# Patient Record
Sex: Female | Born: 1935
Health system: Southern US, Community
[De-identification: ages and names within clinical notes are randomized; demographics above are authoritative.]

## PROBLEM LIST (undated history)

## (undated) DIAGNOSIS — K219 Gastro-esophageal reflux disease without esophagitis: Secondary | ICD-10-CM

## (undated) DIAGNOSIS — R42 Dizziness and giddiness: Secondary | ICD-10-CM

## (undated) DIAGNOSIS — Z85038 Personal history of other malignant neoplasm of large intestine: Secondary | ICD-10-CM

## (undated) DIAGNOSIS — A0472 Enterocolitis due to Clostridium difficile, not specified as recurrent: Secondary | ICD-10-CM

## (undated) DIAGNOSIS — F32A Depression, unspecified: Secondary | ICD-10-CM

## (undated) DIAGNOSIS — E785 Hyperlipidemia, unspecified: Secondary | ICD-10-CM

## (undated) DIAGNOSIS — M4850XA Collapsed vertebra, not elsewhere classified, site unspecified, initial encounter for fracture: Secondary | ICD-10-CM

## (undated) DIAGNOSIS — Z87440 Personal history of urinary (tract) infections: Secondary | ICD-10-CM

## (undated) DIAGNOSIS — C801 Malignant (primary) neoplasm, unspecified: Secondary | ICD-10-CM

## (undated) DIAGNOSIS — F419 Anxiety disorder, unspecified: Secondary | ICD-10-CM

## (undated) DIAGNOSIS — M1990 Unspecified osteoarthritis, unspecified site: Secondary | ICD-10-CM

## (undated) DIAGNOSIS — I10 Essential (primary) hypertension: Secondary | ICD-10-CM

## (undated) DIAGNOSIS — F0390 Unspecified dementia without behavioral disturbance: Secondary | ICD-10-CM

## (undated) DIAGNOSIS — F329 Major depressive disorder, single episode, unspecified: Secondary | ICD-10-CM

## (undated) HISTORY — PX: CATARACT EXTRACTION: SUR2

## (undated) HISTORY — DX: Collapsed vertebra, not elsewhere classified, site unspecified, initial encounter for fracture: M48.50XA

## (undated) HISTORY — DX: Hyperlipidemia, unspecified: E78.5

## (undated) HISTORY — DX: Anxiety disorder, unspecified: F41.9

## (undated) HISTORY — DX: Dizziness and giddiness: R42

## (undated) HISTORY — DX: Personal history of other malignant neoplasm of large intestine: Z85.038

## (undated) HISTORY — DX: Major depressive disorder, single episode, unspecified: F32.9

## (undated) HISTORY — DX: Depression, unspecified: F32.A

## (undated) HISTORY — PX: APPENDECTOMY: SHX54

## (undated) HISTORY — DX: Unspecified osteoarthritis, unspecified site: M19.90

## (undated) HISTORY — PX: TONSILLECTOMY: SUR1361

## (undated) HISTORY — DX: Gastro-esophageal reflux disease without esophagitis: K21.9

## (undated) HISTORY — DX: Essential (primary) hypertension: I10

## (undated) HISTORY — PX: COLON RESECTION: SHX5231

---

## 1998-02-21 ENCOUNTER — Emergency Department (HOSPITAL_COMMUNITY): Admission: EM | Admit: 1998-02-21 | Discharge: 1998-02-21 | Payer: Self-pay | Admitting: Emergency Medicine

## 1998-08-15 ENCOUNTER — Inpatient Hospital Stay (HOSPITAL_COMMUNITY): Admission: EM | Admit: 1998-08-15 | Discharge: 1998-08-17 | Payer: Self-pay | Admitting: Emergency Medicine

## 2000-01-05 ENCOUNTER — Other Ambulatory Visit: Admission: RE | Admit: 2000-01-05 | Discharge: 2000-01-05 | Payer: Self-pay | Admitting: Family Medicine

## 2001-02-07 ENCOUNTER — Encounter: Admission: RE | Admit: 2001-02-07 | Discharge: 2001-02-07 | Payer: Self-pay | Admitting: Internal Medicine

## 2001-02-07 ENCOUNTER — Encounter: Payer: Self-pay | Admitting: Internal Medicine

## 2001-11-02 LAB — HM DEXA SCAN

## 2002-01-23 ENCOUNTER — Other Ambulatory Visit: Admission: RE | Admit: 2002-01-23 | Discharge: 2002-01-23 | Payer: Self-pay | Admitting: Internal Medicine

## 2002-10-02 LAB — HM COLONOSCOPY: HM Colonoscopy: NEGATIVE

## 2002-10-03 ENCOUNTER — Ambulatory Visit (HOSPITAL_COMMUNITY): Admission: RE | Admit: 2002-10-03 | Discharge: 2002-10-03 | Payer: Self-pay | Admitting: Gastroenterology

## 2004-01-15 ENCOUNTER — Encounter: Admission: RE | Admit: 2004-01-15 | Discharge: 2004-01-15 | Payer: Self-pay | Admitting: Family Medicine

## 2004-09-01 ENCOUNTER — Encounter: Admission: RE | Admit: 2004-09-01 | Discharge: 2004-09-01 | Payer: Self-pay | Admitting: Family Medicine

## 2004-11-24 ENCOUNTER — Ambulatory Visit: Payer: Self-pay | Admitting: Family Medicine

## 2005-01-26 ENCOUNTER — Ambulatory Visit: Payer: Self-pay | Admitting: Family Medicine

## 2005-08-27 ENCOUNTER — Ambulatory Visit: Payer: Self-pay | Admitting: Family Medicine

## 2005-09-29 ENCOUNTER — Ambulatory Visit: Payer: Self-pay | Admitting: Family Medicine

## 2005-10-06 ENCOUNTER — Ambulatory Visit: Payer: Self-pay | Admitting: Family Medicine

## 2005-10-20 ENCOUNTER — Ambulatory Visit: Payer: Self-pay | Admitting: Family Medicine

## 2005-11-23 ENCOUNTER — Ambulatory Visit: Payer: Self-pay | Admitting: Family Medicine

## 2006-01-25 ENCOUNTER — Ambulatory Visit: Payer: Self-pay | Admitting: Family Medicine

## 2006-02-03 ENCOUNTER — Ambulatory Visit: Payer: Self-pay | Admitting: Family Medicine

## 2006-02-08 ENCOUNTER — Ambulatory Visit: Payer: Self-pay | Admitting: Family Medicine

## 2006-03-16 ENCOUNTER — Ambulatory Visit: Payer: Self-pay | Admitting: Family Medicine

## 2006-03-23 ENCOUNTER — Ambulatory Visit: Payer: Self-pay | Admitting: Family Medicine

## 2006-05-06 ENCOUNTER — Ambulatory Visit: Payer: Self-pay | Admitting: Family Medicine

## 2006-08-03 ENCOUNTER — Ambulatory Visit: Payer: Self-pay | Admitting: Family Medicine

## 2006-12-01 ENCOUNTER — Emergency Department (HOSPITAL_COMMUNITY): Admission: EM | Admit: 2006-12-01 | Discharge: 2006-12-02 | Payer: Self-pay | Admitting: Emergency Medicine

## 2006-12-01 ENCOUNTER — Ambulatory Visit: Payer: Self-pay | Admitting: Family Medicine

## 2006-12-01 LAB — CONVERTED CEMR LAB
ALT: 15 units/L (ref 0–40)
AST: 19 units/L (ref 0–37)
Cholesterol: 195 mg/dL (ref 0–200)
Direct LDL: 109.9 mg/dL
HDL: 30.7 mg/dL — ABNORMAL LOW (ref >39.0)
Total CHOL/HDL Ratio: 6.4
Triglycerides: 265 mg/dL (ref 0–149)
VLDL: 53 mg/dL — ABNORMAL HIGH (ref 0–40)

## 2007-02-04 ENCOUNTER — Ambulatory Visit: Payer: Self-pay | Admitting: Family Medicine

## 2007-02-21 ENCOUNTER — Ambulatory Visit: Payer: Self-pay | Admitting: Family Medicine

## 2007-10-12 ENCOUNTER — Encounter (INDEPENDENT_AMBULATORY_CARE_PROVIDER_SITE_OTHER): Payer: Self-pay | Admitting: Internal Medicine

## 2007-10-12 ENCOUNTER — Ambulatory Visit: Payer: Self-pay | Admitting: Family Medicine

## 2007-10-17 ENCOUNTER — Telehealth (INDEPENDENT_AMBULATORY_CARE_PROVIDER_SITE_OTHER): Payer: Self-pay | Admitting: Internal Medicine

## 2008-05-01 ENCOUNTER — Telehealth (INDEPENDENT_AMBULATORY_CARE_PROVIDER_SITE_OTHER): Payer: Self-pay | Admitting: *Deleted

## 2008-05-02 ENCOUNTER — Encounter: Payer: Self-pay | Admitting: Family Medicine

## 2008-05-03 ENCOUNTER — Telehealth: Payer: Self-pay | Admitting: Family Medicine

## 2008-05-09 ENCOUNTER — Ambulatory Visit: Payer: Self-pay | Admitting: Family Medicine

## 2008-05-09 DIAGNOSIS — I10 Essential (primary) hypertension: Secondary | ICD-10-CM | POA: Insufficient documentation

## 2008-05-09 DIAGNOSIS — J309 Allergic rhinitis, unspecified: Secondary | ICD-10-CM | POA: Insufficient documentation

## 2008-05-09 DIAGNOSIS — Z85038 Personal history of other malignant neoplasm of large intestine: Secondary | ICD-10-CM | POA: Insufficient documentation

## 2008-05-09 DIAGNOSIS — F329 Major depressive disorder, single episode, unspecified: Secondary | ICD-10-CM | POA: Insufficient documentation

## 2008-05-09 DIAGNOSIS — K648 Other hemorrhoids: Secondary | ICD-10-CM | POA: Insufficient documentation

## 2008-05-09 DIAGNOSIS — K219 Gastro-esophageal reflux disease without esophagitis: Secondary | ICD-10-CM | POA: Insufficient documentation

## 2008-05-09 DIAGNOSIS — E785 Hyperlipidemia, unspecified: Secondary | ICD-10-CM | POA: Insufficient documentation

## 2008-05-09 DIAGNOSIS — M1990 Unspecified osteoarthritis, unspecified site: Secondary | ICD-10-CM | POA: Insufficient documentation

## 2008-05-09 DIAGNOSIS — J45909 Unspecified asthma, uncomplicated: Secondary | ICD-10-CM | POA: Insufficient documentation

## 2008-05-09 DIAGNOSIS — K589 Irritable bowel syndrome without diarrhea: Secondary | ICD-10-CM | POA: Insufficient documentation

## 2008-05-17 ENCOUNTER — Telehealth (INDEPENDENT_AMBULATORY_CARE_PROVIDER_SITE_OTHER): Payer: Self-pay | Admitting: *Deleted

## 2008-08-06 ENCOUNTER — Telehealth: Payer: Self-pay | Admitting: Family Medicine

## 2008-08-07 ENCOUNTER — Telehealth (INDEPENDENT_AMBULATORY_CARE_PROVIDER_SITE_OTHER): Payer: Self-pay | Admitting: *Deleted

## 2008-09-10 ENCOUNTER — Telehealth: Payer: Self-pay | Admitting: Family Medicine

## 2008-09-12 ENCOUNTER — Encounter: Payer: Self-pay | Admitting: Family Medicine

## 2008-12-12 ENCOUNTER — Telehealth (INDEPENDENT_AMBULATORY_CARE_PROVIDER_SITE_OTHER): Payer: Self-pay | Admitting: Internal Medicine

## 2008-12-13 ENCOUNTER — Ambulatory Visit: Payer: Self-pay | Admitting: Family Medicine

## 2008-12-19 LAB — CONVERTED CEMR LAB: TSH: 1.24 microintl units/mL (ref 0.35–5.50)

## 2008-12-28 ENCOUNTER — Encounter: Payer: Self-pay | Admitting: Family Medicine

## 2008-12-28 ENCOUNTER — Emergency Department (HOSPITAL_COMMUNITY): Admission: EM | Admit: 2008-12-28 | Discharge: 2008-12-28 | Payer: Self-pay | Admitting: Emergency Medicine

## 2008-12-31 ENCOUNTER — Ambulatory Visit: Payer: Self-pay | Admitting: Family Medicine

## 2009-01-02 LAB — CONVERTED CEMR LAB
ALT: 16 units/L (ref 0–35)
AST: 24 units/L (ref 0–37)
Albumin: 4 g/dL (ref 3.5–5.2)
Alkaline Phosphatase: 68 units/L (ref 39–117)
BUN: 20 mg/dL (ref 6–23)
Basophils Absolute: 0 10*3/uL (ref 0.0–0.1)
Basophils Relative: 0.1 % (ref 0.0–3.0)
Bilirubin, Direct: 0.1 mg/dL (ref 0.0–0.3)
CO2: 31 meq/L (ref 19–32)
Calcium: 9.6 mg/dL (ref 8.4–10.5)
Chloride: 100 meq/L (ref 96–112)
Creatinine, Ser: 0.9 mg/dL (ref 0.4–1.2)
Eosinophils Absolute: 0.7 10*3/uL (ref 0.0–0.7)
Eosinophils Relative: 9.8 % — ABNORMAL HIGH (ref 0.0–5.0)
GFR calc Af Amer: 77 mL/min
GFR calc non Af Amer: 64 mL/min
Glucose, Bld: 94 mg/dL (ref 70–99)
HCT: 43.5 % (ref 36.0–46.0)
Hemoglobin: 14.9 g/dL (ref 12.0–15.0)
Lymphocytes Relative: 22.4 % (ref 12.0–46.0)
MCHC: 34.2 g/dL (ref 30.0–36.0)
MCV: 100.7 fL — ABNORMAL HIGH (ref 78.0–100.0)
Monocytes Absolute: 0.3 10*3/uL (ref 0.1–1.0)
Monocytes Relative: 3.9 % (ref 3.0–12.0)
Neutro Abs: 4.4 10*3/uL (ref 1.4–7.7)
Neutrophils Relative %: 63.8 % (ref 43.0–77.0)
Phosphorus: 4.2 mg/dL (ref 2.3–4.6)
Platelets: 194 10*3/uL (ref 150–400)
Potassium: 4.2 meq/L (ref 3.5–5.1)
RBC: 4.31 M/uL (ref 3.87–5.11)
RDW: 12 % (ref 11.5–14.6)
Sodium: 140 meq/L (ref 135–145)
Total Bilirubin: 1 mg/dL (ref 0.3–1.2)
Total Protein: 6.8 g/dL (ref 6.0–8.3)
WBC: 6.9 10*3/uL (ref 4.5–10.5)

## 2009-01-07 ENCOUNTER — Telehealth: Payer: Self-pay | Admitting: Family Medicine

## 2009-01-17 ENCOUNTER — Telehealth: Payer: Self-pay | Admitting: Family Medicine

## 2009-02-04 ENCOUNTER — Telehealth: Payer: Self-pay | Admitting: Family Medicine

## 2009-02-05 ENCOUNTER — Ambulatory Visit: Payer: Self-pay | Admitting: Family Medicine

## 2009-02-06 LAB — CONVERTED CEMR LAB
ALT: 15 units/L (ref 0–35)
AST: 19 units/L (ref 0–37)
Albumin: 4.1 g/dL (ref 3.5–5.2)
Alkaline Phosphatase: 64 units/L (ref 39–117)
Basophils Absolute: 0.1 10*3/uL (ref 0.0–0.1)
Basophils Relative: 0.9 % (ref 0.0–3.0)
Bilirubin, Direct: 0.1 mg/dL (ref 0.0–0.3)
Cholesterol: 158 mg/dL (ref 0–200)
Creatinine, Ser: 0.7 mg/dL (ref 0.4–1.2)
Direct LDL: 92 mg/dL
Eosinophils Absolute: 0.6 10*3/uL (ref 0.0–0.7)
Eosinophils Relative: 8.4 % — ABNORMAL HIGH (ref 0.0–5.0)
Glucose, Bld: 106 mg/dL — ABNORMAL HIGH (ref 70–99)
HCT: 42.3 % (ref 36.0–46.0)
HDL: 27.7 mg/dL — ABNORMAL LOW (ref >39.00)
Hemoglobin: 14.4 g/dL (ref 12.0–15.0)
Lymphocytes Relative: 19.3 % (ref 12.0–46.0)
Lymphs Abs: 1.4 10*3/uL (ref 0.7–4.0)
MCHC: 34.1 g/dL (ref 30.0–36.0)
MCV: 98.8 fL (ref 78.0–100.0)
Monocytes Absolute: 0.7 10*3/uL (ref 0.1–1.0)
Monocytes Relative: 9.6 % (ref 3.0–12.0)
Neutro Abs: 4.2 10*3/uL (ref 1.4–7.7)
Neutrophils Relative %: 61.8 % (ref 43.0–77.0)
Phosphorus: 3 mg/dL (ref 2.3–4.6)
Platelets: 198 10*3/uL (ref 150.0–400.0)
Potassium: 4.5 meq/L (ref 3.5–5.1)
RBC: 4.28 M/uL (ref 3.87–5.11)
RDW: 12.1 % (ref 11.5–14.6)
Sodium: 141 meq/L (ref 135–145)
TSH: 0.92 microintl units/mL (ref 0.35–5.50)
Total Bilirubin: 0.8 mg/dL (ref 0.3–1.2)
Total CHOL/HDL Ratio: 6
Total Protein: 6.6 g/dL (ref 6.0–8.3)
Triglycerides: 252 mg/dL — ABNORMAL HIGH (ref 0.0–149.0)
VLDL: 50.4 mg/dL — ABNORMAL HIGH (ref 0.0–40.0)
WBC: 7 10*3/uL (ref 4.5–10.5)

## 2009-05-01 ENCOUNTER — Encounter: Payer: Self-pay | Admitting: Family Medicine

## 2009-05-07 ENCOUNTER — Ambulatory Visit: Payer: Self-pay | Admitting: Family Medicine

## 2009-07-10 ENCOUNTER — Ambulatory Visit: Payer: Self-pay | Admitting: Family Medicine

## 2009-07-11 LAB — CONVERTED CEMR LAB
ALT: 17 units/L (ref 0–35)
AST: 22 units/L (ref 0–37)
Albumin: 4 g/dL (ref 3.5–5.2)
BUN: 20 mg/dL (ref 6–23)
CO2: 31 meq/L (ref 19–32)
Calcium: 9.5 mg/dL (ref 8.4–10.5)
Chloride: 104 meq/L (ref 96–112)
Cholesterol: 180 mg/dL (ref 0–200)
Creatinine, Ser: 0.8 mg/dL (ref 0.4–1.2)
Direct LDL: 112.5 mg/dL
Glucose, Bld: 105 mg/dL — ABNORMAL HIGH (ref 70–99)
HDL: 30.9 mg/dL — ABNORMAL LOW (ref >39.00)
Phosphorus: 3.8 mg/dL (ref 2.3–4.6)
Potassium: 4.8 meq/L (ref 3.5–5.1)
Sodium: 141 meq/L (ref 135–145)
Total CHOL/HDL Ratio: 6
Triglycerides: 225 mg/dL — ABNORMAL HIGH (ref 0.0–149.0)
VLDL: 45 mg/dL — ABNORMAL HIGH (ref 0.0–40.0)

## 2009-07-15 ENCOUNTER — Ambulatory Visit: Payer: Self-pay | Admitting: Family Medicine

## 2009-11-07 ENCOUNTER — Encounter: Payer: Self-pay | Admitting: Family Medicine

## 2009-11-20 ENCOUNTER — Encounter: Admission: RE | Admit: 2009-11-20 | Discharge: 2009-11-20 | Payer: Self-pay | Admitting: Orthopedic Surgery

## 2009-12-10 ENCOUNTER — Encounter: Payer: Self-pay | Admitting: Family Medicine

## 2009-12-10 ENCOUNTER — Telehealth: Payer: Self-pay | Admitting: Family Medicine

## 2009-12-17 ENCOUNTER — Telehealth: Payer: Self-pay | Admitting: Family Medicine

## 2009-12-17 ENCOUNTER — Encounter: Payer: Self-pay | Admitting: Family Medicine

## 2010-01-02 ENCOUNTER — Encounter: Payer: Self-pay | Admitting: Family Medicine

## 2010-01-03 ENCOUNTER — Telehealth: Payer: Self-pay | Admitting: Family Medicine

## 2010-01-06 ENCOUNTER — Ambulatory Visit: Payer: Self-pay | Admitting: Family Medicine

## 2010-01-07 LAB — CONVERTED CEMR LAB
ALT: 15 units/L (ref 0–35)
AST: 21 units/L (ref 0–37)
Albumin: 3.9 g/dL (ref 3.5–5.2)
Alkaline Phosphatase: 65 units/L (ref 39–117)
BUN: 21 mg/dL (ref 6–23)
Bilirubin, Direct: 0.1 mg/dL (ref 0.0–0.3)
CO2: 31 meq/L (ref 19–32)
Calcium: 9.6 mg/dL (ref 8.4–10.5)
Chloride: 101 meq/L (ref 96–112)
Cholesterol: 160 mg/dL (ref 0–200)
Creatinine, Ser: 0.8 mg/dL (ref 0.4–1.2)
Direct LDL: 98.6 mg/dL
GFR calc non Af Amer: 72.48 mL/min (ref >60)
Glucose, Bld: 113 mg/dL — ABNORMAL HIGH (ref 70–99)
HDL: 34.2 mg/dL — ABNORMAL LOW (ref >39.00)
Phosphorus: 4 mg/dL (ref 2.3–4.6)
Potassium: 4.4 meq/L (ref 3.5–5.1)
Sodium: 140 meq/L (ref 135–145)
Total Bilirubin: 0.8 mg/dL (ref 0.3–1.2)
Total CHOL/HDL Ratio: 5
Total Protein: 6.7 g/dL (ref 6.0–8.3)
Triglycerides: 252 mg/dL — ABNORMAL HIGH (ref 0.0–149.0)
VLDL: 50.4 mg/dL — ABNORMAL HIGH (ref 0.0–40.0)

## 2010-01-08 ENCOUNTER — Ambulatory Visit: Payer: Self-pay | Admitting: Family Medicine

## 2010-01-08 DIAGNOSIS — R7303 Prediabetes: Secondary | ICD-10-CM | POA: Insufficient documentation

## 2010-02-28 ENCOUNTER — Emergency Department (HOSPITAL_COMMUNITY)
Admission: EM | Admit: 2010-02-28 | Discharge: 2010-02-28 | Payer: Self-pay | Source: Home / Self Care | Admitting: Emergency Medicine

## 2010-07-08 ENCOUNTER — Ambulatory Visit: Payer: Self-pay | Admitting: Family Medicine

## 2010-07-08 LAB — CONVERTED CEMR LAB
ALT: 16 units/L (ref 0–35)
AST: 21 units/L (ref 0–37)
Albumin: 4.3 g/dL (ref 3.5–5.2)
BUN: 20 mg/dL (ref 6–23)
CO2: 32 meq/L (ref 19–32)
Calcium: 9.9 mg/dL (ref 8.4–10.5)
Chloride: 103 meq/L (ref 96–112)
Cholesterol: 182 mg/dL (ref 0–200)
Creatinine, Ser: 0.7 mg/dL (ref 0.4–1.2)
Direct LDL: 113.7 mg/dL
GFR calc non Af Amer: 80.46 mL/min (ref >60)
Glucose, Bld: 100 mg/dL — ABNORMAL HIGH (ref 70–99)
HDL: 29.6 mg/dL — ABNORMAL LOW (ref >39.00)
Hgb A1c MFr Bld: 5.9 % (ref 4.6–6.5)
Phosphorus: 3.6 mg/dL (ref 2.3–4.6)
Potassium: 4.8 meq/L (ref 3.5–5.1)
Sodium: 142 meq/L (ref 135–145)
Total CHOL/HDL Ratio: 6
Triglycerides: 246 mg/dL — ABNORMAL HIGH (ref 0.0–149.0)
VLDL: 49.2 mg/dL — ABNORMAL HIGH (ref 0.0–40.0)

## 2010-07-23 ENCOUNTER — Ambulatory Visit: Payer: Self-pay | Admitting: Family Medicine

## 2010-09-04 ENCOUNTER — Ambulatory Visit: Payer: Self-pay | Admitting: Family Medicine

## 2010-09-04 DIAGNOSIS — I8390 Asymptomatic varicose veins of unspecified lower extremity: Secondary | ICD-10-CM | POA: Insufficient documentation

## 2010-09-08 ENCOUNTER — Encounter (INDEPENDENT_AMBULATORY_CARE_PROVIDER_SITE_OTHER): Payer: Self-pay | Admitting: *Deleted

## 2010-09-10 ENCOUNTER — Encounter: Payer: Self-pay | Admitting: Family Medicine

## 2010-10-17 ENCOUNTER — Ambulatory Visit: Payer: Self-pay | Admitting: Family Medicine

## 2010-10-17 ENCOUNTER — Encounter: Payer: Self-pay | Admitting: Family Medicine

## 2010-10-17 DIAGNOSIS — N390 Urinary tract infection, site not specified: Secondary | ICD-10-CM | POA: Insufficient documentation

## 2010-10-17 LAB — CONVERTED CEMR LAB
Cholesterol, target level: 200 mg/dL
Glucose, Urine, Semiquant: NEGATIVE
HDL goal, serum: 40 mg/dL
LDL Goal: 130 mg/dL
Nitrite: NEGATIVE
Protein, U semiquant: NEGATIVE
Specific Gravity, Urine: 1.025
Urobilinogen, UA: 0.2
pH: 5

## 2010-10-20 ENCOUNTER — Telehealth: Payer: Self-pay | Admitting: Family Medicine

## 2010-10-20 ENCOUNTER — Emergency Department: Payer: Self-pay | Admitting: Emergency Medicine

## 2010-10-21 ENCOUNTER — Emergency Department: Payer: Self-pay | Admitting: Emergency Medicine

## 2010-10-22 ENCOUNTER — Observation Stay (HOSPITAL_COMMUNITY)
Admission: EM | Admit: 2010-10-22 | Discharge: 2010-10-24 | Payer: Self-pay | Source: Home / Self Care | Attending: Internal Medicine | Admitting: Internal Medicine

## 2010-10-24 ENCOUNTER — Encounter (INDEPENDENT_AMBULATORY_CARE_PROVIDER_SITE_OTHER): Payer: Self-pay | Admitting: Internal Medicine

## 2010-10-24 ENCOUNTER — Encounter: Payer: Self-pay | Admitting: Family Medicine

## 2010-10-25 ENCOUNTER — Observation Stay (HOSPITAL_COMMUNITY)
Admission: EM | Admit: 2010-10-25 | Discharge: 2010-10-28 | Payer: Self-pay | Source: Home / Self Care | Attending: Internal Medicine | Admitting: Internal Medicine

## 2010-10-28 ENCOUNTER — Encounter: Payer: Self-pay | Admitting: Family Medicine

## 2010-11-07 ENCOUNTER — Ambulatory Visit (HOSPITAL_COMMUNITY)
Admission: RE | Admit: 2010-11-07 | Discharge: 2010-11-07 | Payer: Self-pay | Source: Home / Self Care | Attending: Internal Medicine | Admitting: Internal Medicine

## 2010-11-10 ENCOUNTER — Telehealth: Payer: Self-pay | Admitting: Family Medicine

## 2010-11-11 ENCOUNTER — Ambulatory Visit (HOSPITAL_COMMUNITY)
Admission: RE | Admit: 2010-11-11 | Discharge: 2010-11-11 | Payer: Self-pay | Source: Home / Self Care | Attending: Internal Medicine | Admitting: Internal Medicine

## 2010-11-17 ENCOUNTER — Telehealth: Payer: Self-pay | Admitting: Family Medicine

## 2010-11-28 ENCOUNTER — Ambulatory Visit: Admit: 2010-11-28 | Payer: Self-pay | Admitting: Family Medicine

## 2010-12-02 ENCOUNTER — Encounter: Payer: Self-pay | Admitting: Family Medicine

## 2010-12-04 ENCOUNTER — Telehealth: Payer: Self-pay | Admitting: Family Medicine

## 2010-12-04 NOTE — Assessment & Plan Note (Signed)
Summary: ? UTI/ lb   Vital Signs:  Patient profile:   77 year old female Height:      63 inches Weight:      166 pounds BMI:     29.51 Temp:     97.8 degrees F oral Pulse rate:   88 / minute Pulse rhythm:   regular BP sitting:   150 / 90  (left arm) Cuff size:   large  Vitals Entered By: Melody Comas (October 17, 2010 1:50 PM) CC: uti, Lipid Management   History of Present Illness: Frequency and change in urination.  Occ lower back pain.  Some burning with urination.  No NAV, F, D, chills.  No lower abdominal pain.    L lower back pain after stretching to get something out of the cabinet a few days ago.  discomfort improved when sitting.   Allergies: 1)  ! Augmentin  Review of Systems       See HPI.  Otherwise negative.    Physical Exam  General:  no apparent distress mucous membranes moist regular rate and rhythm clear to auscultation bilaterally abdomen soft, not tender to palpation, no cva pain.  L lower lumbar paraspinal muscles minimally tender to palpation    Impression & Recommendations:  Problem # 1:  UTI (ICD-599.0) Assessment New Likely cystitis with unrelated muscle strain.  I don't suspect pyelo.  okay for outpatient follow up.  Start antibiotics and contact with culture data.  follow up as needed.  She agrees.  Her updated medication list for this problem includes:    Septra Ds 800-160 Mg Tabs (Sulfamethoxazole-trimethoprim) .Marland Kitchen... 1 by mouth two times a day  Orders: Prescription Created Electronically [id number]) T-Culture, Urine ([id number])  Complete Medication List: 1)  Paroxetine Hcl 30 Mg Tabs (Paroxetine hcl) .Marland Kitchen.. 1 by mouth once daily 2)  Toprol Xl 50 Mg Tb24 (Metoprolol succinate) .Marland Kitchen.. 1 by mouth once daily 3)  Tylenol Extra Strength 500 Mg Tabs (Acetaminophen) .... As needed 4)  Allopurinol 100 Mg Tabs (Allopurinol) .... 2 by mouth once daily 5)  Calcium 500/d 500-400 Mg-unit Chew (Calcium-vitamin d) .... Take 1 tablet by mouth two times  a day 6)  Glucosamine-chondroitin 1500-1200 Mg/53ml Liqd (Glucosamine-chondroitin) .Marland Kitchen.. 1 daily by mouth 7)  Bayer Aspirin Ec Low Dose 81 Mg Tbec (Aspirin) .Marland Kitchen.. 1 daily by mouth 8)  Meclizine Hcl 25 Mg Tabs (Meclizine hcl) .... One tab by mouth three times a day as needed for dizziness/vertigo 9)  Cozaar 100 Mg Tabs (Losartan potassium) .Marland Kitchen.. 1 by mouth once daily 10)  Alprazolam 0.5 Mg Tabs (Alprazolam) .... 1/2 to 1 by mouth up to two times a day as needed severe anxiety 11)  Flonase 50 Mcg/act Susp (Fluticasone propionate) .... 2 sprays in each nostril once daily as needed 12)  Anusol-hc 25 Mg Supp (Hydrocortisone acetate) .Marland Kitchen.. 1 per rectum at bedtime for maximum 10 days for hemorroids as needed 13)  Multivitamins Tabs (Multiple vitamin) .... Take one daily 14)  Support Stockings To Knee-- 15-20 Mm Hg  .... To wear once daily and as needed for leg edema and painful varicose veins 15)  Voltaren 1 % Gel (Diclofenac sodium) .... Apply to affected areas up to four times daily  as needed arthritis pain 16)  Septra Ds 800-160 Mg Tabs (Sulfamethoxazole-trimethoprim) .Marland Kitchen.. 1 by mouth two times a day    Patient Instructions: 1)  Start the antibiotics today and we'll let you know about the culture.  Let us know if you aren't improving.  Take care.  Prescriptions: SEPTRA DS 800-160 MG TABS (SULFAMETHOXAZOLE-TRIMETHOPRIM) 1 by mouth two times a day  #6 x 0   Entered and Authorized by:   Crawford Givens MD   Signed by:   Crawford Givens MD on 10/17/2010   Method used:   Electronically to        [facility]* (retail)       [address]       [place], [place]  [address]       Ph: [phone]       Fax: [phone]   RxID:   [id number]    Orders Added: 1)  Prescription Created Electronically [[id number]] 2)  Est. Patient Level III [40102] 3)  T-Culture, Urine [[id number]]    Current Allergies (reviewed today): ! AUGMENTIN  Laboratory Results   Urine Tests  Date/Time Received: October 17, 2010 2:01 PM Date/Time Reported: October 17, 2010 2:01 PM  Routine Urinalysis   Color: yellow Appearance: Cloudy Glucose: negative   (Normal Range: Negative) Bilirubin: small   (Normal Range: Negative) Ketone: trace (5)   (Normal Range: Negative) Spec. Gravity: 1.025   (Normal Range: 1.003-1.035) Blood: trace-intact   (Normal Range: Negative) pH: 5.0   (Normal Range: 5.0-8.0) Protein: negative   (Normal Range: Negative) Urobilinogen: 0.2   (Normal Range: 0-1) Nitrite: negative   (Normal Range: Negative) Leukocyte Esterace: trace   (Normal Range: Negative)

## 2010-12-04 NOTE — Progress Notes (Signed)
Summary: not any better  Phone Note Call from Patient Call back at Home Phone [phone]   Caller: Patient Call For: Dr. Para March Summary of Call: Pt states that she is not any better since her office visit. Back is still hurting.  I advised her that it can take a while for a pulled muscle to heal, she asks if she should be up moving around or should she stay off her feet.  Also, pull review urine culture results and advise. Initial call taken by: Lowella Petties CMA, AAMA,  October 20, 2010 11:35 AM  Follow-up for Phone Call        I would be as active as she can tolerate and gently stretch her back.  Ucx w/o sig growth.  If she continues to have dysuria (for a few more days), then we should reculture her urine.  follow up as needed.  thanks.  see ucx report.  Follow-up by: Crawford Givens MD,  October 20, 2010 1:39 PM  Additional Follow-up for Phone Call Additional follow up Details #1::        Spoke with pt and advised her of instructions. Additional Follow-up by: Lowella Petties CMA, AAMA,  October 20, 2010 4:07 PM

## 2010-12-04 NOTE — Letter (Signed)
Summary: [facility]   Imported By: Beau Fanny 11/04/2010 14:54:40  _____________________________________________________________________  External Attachment:    Type:   Image     Comment:   External Document

## 2010-12-04 NOTE — Assessment & Plan Note (Signed)
Summary: 2 MONTH FOLLOW UP/[facility]   Vital Signs:  Patient profile:   77 year old female Height:      63 inches Weight:      163 pounds Temp:     98 degrees F oral Pulse rate:   108 / minute Pulse rhythm:   regular BP sitting:   140 / 88  (left arm) Cuff size:   regular  Vitals Entered By: Liane Comber CMA (AAMA) (July 15, 2009 9:04 AM)  Serial Vital Signs/Assessments:  Time      Position  BP       Pulse  Resp  Temp     By                     782/95                         [name] MD   History of Present Illness: here for f/u of HTN and lipids  bp is improved from last visit did inc cozaar to 100 mg daily  no trouble with that at all   last lipids trig down a bit trid 225/ HDL 30;9- low , and LDL 112 (up a bit from the 90s)  sugar is stable at 101  for oa - tylenol 2 in am and 2 in pm     Allergies: 1)  ! Augmentin  Review of Systems General:  Denies fatigue, fever, loss of appetite, and malaise. Eyes:  Denies blurring and eye pain; some lid droop over eyes - may need to have that worked on in future if [employer] her vision. CV:  Denies chest pain or discomfort, palpitations, and shortness of breath with exertion. Resp:  Denies cough and wheezing. GI:  Denies abdominal pain and change in bowel habits. GU:  Denies urinary frequency. MS:  Complains of joint pain and stiffness; denies joint redness and joint swelling; did sprain her R ankle- was swollen - now getting better. Derm:  Denies itching, lesion(s), poor wound healing, and rash. Neuro:  Denies numbness and tremors. Psych:  mood is fair. Endo:  Denies cold intolerance, excessive thirst, excessive urination, and heat intolerance. Heme:  Denies abnormal bruising and bleeding.  Physical Exam  General:  overweight but generally well appearing  Head:  normocephalic, atraumatic, and no abnormalities observed.   Eyes:  vision grossly intact, pupils equal, pupils round, and pupils reactive to light.  no  conjunctival pallor, injection or icterus  Mouth:  pharynx pink and moist.   Neck:  supple with full rom and no masses or thyromegally, no JVD or carotid bruit  Lungs:  Normal respiratory effort, chest expands symmetrically. Lungs are clear to auscultation, no crackles or wheezes. Heart:  Normal rate and regular rhythm. S1 and S2 normal without gallop, murmur, click, rub or other extra sounds. Msk:  poor rom knees- with slow intentional gait  no kyphosis no spinal tenderness nl rom ankles  Extremities:  No clubbing, cyanosis, edema, or deformity noted with normal full range of motion of all joints.   Neurologic:  sensation intact to light touch, gait normal, and DTRs symmetrical and normal.   Skin:  Intact without suspicious lesions or rashes Cervical Nodes:  No lymphadenopathy noted Inguinal Nodes:  No significant adenopathy Psych:  normal affect, talkative and pleasant    Impression & Recommendations:  Problem # 1:  HYPERTENSION (ICD-401.9) Assessment Improved  improved with inc cozaar - ant tolerating it well  rev labs today disc healthy diet (low simple sugar/ choose complex carbs/ low sat fat) diet and exercise in detail  plan labs and then f/u in 6 mo  Her updated medication list for this problem includes:    Toprol Xl 50 Mg Tb24 (Metoprolol succinate) .Marland Kitchen... 1 by mouth once daily    Cozaar 100 Mg Tabs (Losartan potassium) .Marland Kitchen... 1 by mouth once daily  BP today: 140/88-- re check 128/83 Prior BP: 150/90 (05/07/2009)  Labs Reviewed: K+: 4.8 (07/10/2009) Creat: : 0.8 (07/10/2009)   Chol: 180 (07/10/2009)   HDL: 30.90 (07/10/2009)   LDL: DEL (12/01/2006)   TG: 225.0 (07/10/2009)  Problem # 2:  HYPERLIPIDEMIA (ICD-272.4) Assessment: Unchanged  with slt imp trig- still hig rev low sat fat diet and exercise  will continue to work on it  re check 6 mo and f/u  Labs Reviewed: SGOT: 22 (07/10/2009)   SGPT: 17 (07/10/2009)   HDL:30.90 (07/10/2009), 27.70 (02/05/2009)  LDL:DEL  (12/01/2006)  Chol:180 (07/10/2009), 158 (02/05/2009)  Trig:225.0 (07/10/2009), 252.0 (02/05/2009)  Complete Medication List: 1)  Paroxetine Hcl 30 Mg Tabs (Paroxetine hcl) .Marland Kitchen.. 1 by mouth once daily 2)  Toprol Xl 50 Mg Tb24 (Metoprolol succinate) .Marland Kitchen.. 1 by mouth once daily 3)  Tylenol Extra Strength 500 Mg Tabs (Acetaminophen) .... As needed 4)  Allopurinol 100 Mg Tabs (Allopurinol) .... 2 by mouth once daily 5)  Calcium 500/d 500-400 Mg-unit Chew (Calcium-vitamin d) .... Take 1 tablet by mouth two times a day 6)  Glucosamine-chondroitin 1500-1200 Mg/14ml Liqd (Glucosamine-chondroitin) .Marland Kitchen.. 1 daily by mouth 7)  Bayer Aspirin Ec Low Dose 81 Mg Tbec (Aspirin) .Marland Kitchen.. 1 daily by mouth 8)  Meclizine Hcl 25 Mg Tabs (Meclizine hcl) .... One tab by mouth three times a day as needed for dizziness/vertigo 9)  Cozaar 100 Mg Tabs (Losartan potassium) .Marland Kitchen.. 1 by mouth once daily 10)  Alprazolam 0.5 Mg Tabs (Alprazolam) .... 1/2 to 1 by mouth up to two times a day as needed severe anxiety 11)  Flonase 50 Mcg/act Susp (Fluticasone propionate) .... 2 sprays in each nostril once daily 12)  Anusol-hc 25 Mg Supp (Hydrocortisone acetate) .Marland Kitchen.. 1 per rectum at bedtime for maximum 10 days for hemorroids  Patient Instructions: 1)  no changes in medication  2)  keep watching diet - 3)  you can raise your HDL (good cholesterol) by increasing exercise and eating omega 3 fatty acid supplement like fish oil or flax seed oil over the counter 4)  you can lower LDL (bad cholesterol) by limiting saturated fats in diet like red meat, fried foods, egg yolks, fatty breakfast meats, high fat dairy products and shellfish  5)  get as much exercise as you can  6)  schedule fasting labs in 6 months lipid/ast/alt /renal 272  Prior Medications (reviewed today): PAROXETINE HCL 30 MG  TABS (PAROXETINE HCL) 1 by mouth once daily TOPROL XL 50 MG  TB24 (METOPROLOL SUCCINATE) 1 by mouth once daily TYLENOL EXTRA STRENGTH 500 MG  TABS  (ACETAMINOPHEN) as needed ALLOPURINOL 100 MG  TABS (ALLOPURINOL) 2 by mouth once daily CALCIUM 500/D 500-400 MG-UNIT  CHEW (CALCIUM-VITAMIN D) Take 1 tablet by mouth two times a day GLUCOSAMINE-CHONDROITIN 1500-1200 MG/30ML  LIQD (GLUCOSAMINE-CHONDROITIN) 1 DAILY BY MOUTH BAYER ASPIRIN EC LOW DOSE 81 MG  TBEC (ASPIRIN) 1 DAILY BY MOUTH MECLIZINE HCL 25 MG TABS (MECLIZINE HCL) one tab by mouth three times a day as needed for dizziness/vertigo COZAAR 100 MG TABS (LOSARTAN POTASSIUM) 1 by mouth once daily  ALPRAZOLAM 0.5 MG TABS (ALPRAZOLAM) 1/2 to 1 by mouth up to two times a day as needed severe anxiety FLONASE 50 MCG/ACT SUSP (FLUTICASONE PROPIONATE) 2 sprays in each nostril once daily ANUSOL-HC 25 MG SUPP (HYDROCORTISONE ACETATE) 1 per rectum at bedtime for maximum 10 days for hemorroids Current Allergies (reviewed today): ! AUGMENTIN Current Medications (including changes made in today's visit):  PAROXETINE HCL 30 MG  TABS (PAROXETINE HCL) 1 by mouth once daily TOPROL XL 50 MG  TB24 (METOPROLOL SUCCINATE) 1 by mouth once daily TYLENOL EXTRA STRENGTH 500 MG  TABS (ACETAMINOPHEN) as needed ALLOPURINOL 100 MG  TABS (ALLOPURINOL) 2 by mouth once daily CALCIUM 500/D 500-400 MG-UNIT  CHEW (CALCIUM-VITAMIN D) Take 1 tablet by mouth two times a day GLUCOSAMINE-CHONDROITIN 1500-1200 MG/30ML  LIQD (GLUCOSAMINE-CHONDROITIN) 1 DAILY BY MOUTH BAYER ASPIRIN EC LOW DOSE 81 MG  TBEC (ASPIRIN) 1 DAILY BY MOUTH MECLIZINE HCL 25 MG TABS (MECLIZINE HCL) one tab by mouth three times a day as needed for dizziness/vertigo COZAAR 100 MG TABS (LOSARTAN POTASSIUM) 1 by mouth once daily ALPRAZOLAM 0.5 MG TABS (ALPRAZOLAM) 1/2 to 1 by mouth up to two times a day as needed severe anxiety FLONASE 50 MCG/ACT SUSP (FLUTICASONE PROPIONATE) 2 sprays in each nostril once daily ANUSOL-HC 25 MG SUPP (HYDROCORTISONE ACETATE) 1 per rectum at bedtime for maximum 10 days for hemorroids

## 2010-12-04 NOTE — Progress Notes (Signed)
   Phone Note Call from Patient   Caller: Patient Details for Reason: ARM BETTER Summary of Call: PT CALLED TO SAY HER ARM IS BETTER SWELLING GONE DOWN AND SHE WILL NOT BE STOPPING BY FOR YOU TO LOOK AT HER. SHE WILL SEE ORTHO INSTEAD. Initial call taken by: Carlton Adam,  October 17, 2007 8:20 AM  Follow-up for Phone Call        noted and agree  ..................................................................Marland KitchenBillie-[name] Tyler Deis FNP  October 17, 2007 12:49 PM

## 2010-12-04 NOTE — Progress Notes (Signed)
Summary: refill request for relafen  Phone Note Refill Request Message from:  Patient  Refills Requested: Medication #1:  NABUMETONE 500 MG  TABS one by mouth two times a day as needed with food [facility] send to [facility]  Initial call taken by: Lowella Petties,  August 06, 2008 2:17 PM  Follow-up for Phone Call        It looks like she got 90 day supply with refils in july 09 if she is waiting on late mail order-- can have 1 mo supply called into pharm  Follow-up by: [name] MD,  August 06, 2008 2:29 PM  Additional Follow-up for Phone Call Additional follow up Details #1::        Rx done, was sent electronically. ....................................................Marland KitchenLiane Comber August 06, 2008 3:59 PM       Prescriptions: NABUMETONE 500 MG  TABS (NABUMETONE) one by mouth two times a day as needed with food  #60 x 0   Entered by:   Liane Comber   Authorized by:   [name] MD   Signed by:   Liane Comber on 08/06/2008   Method used:   Electronically to        [facility]* (retail)       [address]       [place], [place]  [address]       Ph: [phone]       Fax: [phone]   RxID:   [id number]

## 2010-12-04 NOTE — Assessment & Plan Note (Signed)
Summary: RIGHT FOOT PAIN/CLE   Vital Signs:  Patient profile:   77 year old female Height:      63 inches Temp:     98.1 degrees F oral Pulse rate:   88 / minute Pulse rhythm:   regular BP sitting:   144 / 84  (left arm) Cuff size:   large  Vitals Entered By: Lewanda Rife LPN (September 04, 2010 11:51 AM) CC: Pain in rt leg including knee and foot   History of Present Illness: her varicose veins are really bothering her in R leg  has been sitting quite a bit  no redness or knots  no lumps she can feel     arthritis in knees  had some injections in L  now right one is inflammed -- and needs it treated  this affects her mobility   she used to see Dr Jon Billings (rheum) for arthritis and gout but cannot return for multiple reasons would like ref to a new rheumatologist        Allergies: 1)  ! Augmentin  Past History:  Past Surgical History: Last updated: 05/09/2008 Colon resection Appendectomy Cataract extraction Tonsillectomy Hosp- withdrawel off lorazepam Dexa- osteopenia (2002) LVH, hyperdynamic LVF 86%- persentine cardiolite (01/1997) Colonoscopy- neg (10/2002) LS- facet point deg change (10/205) Abd [place]- neg (10/2005) Old compression fracture- spine Fall- fractured wrist, concussion (11/2006)  Family History: Last updated: 2009-01-27 Father: deceased - prostate cancer age 56 Mother: MI, ? gout  Social History: Last updated: 2009-01-27 Marital Status:widowed Children: 1 daughter Occupation: retired from [employer] [place] non smoker non drinker  Past Medical History: Allergic rhinitis Anxiety Colon cancer, hx of Depression GERD Gout Hyperlipidemia Hypertension Osteoarthritis vertigo  rheum -- Devishwar   Review of Systems General:  Denies fatigue, loss of appetite, and malaise. Eyes:  Denies blurring. CV:  Denies chest pain or discomfort, palpitations, and shortness of breath with exertion. Resp:  Complains of shortness of breath. GI:   Denies abdominal pain, nausea, and vomiting. MS:  Complains of joint pain, joint swelling, and stiffness; denies joint redness, muscle aches, and cramps. Derm:  Denies itching, lesion(s), poor wound healing, and rash. Neuro:  Denies numbness, tingling, and weakness. Heme:  Denies abnormal bruising and bleeding.  Physical Exam  General:  overweight but generally well appearing  Head:  normocephalic, atraumatic, and no abnormalities observed.   Heart:  Normal rate and regular rhythm. S1 and S2 normal without gallop, murmur, click, rub or other extra sounds. Msk:  R knee is mildly swollen and warm to the touch - without redness tender in medial joint line and pain to fully extend  favors other leg when walking  Extremities:  R leg has notable varicosities below knee these are compressible and nontender  no ulceration or skin change no swelling/ palp cord/ redness/ mass or tenderness neg homan's sign  Neurologic:  sensation intact to light touch, gait normal, and DTRs symmetrical and normal.   Skin:  Intact without suspicious lesions or rashes Psych:  normal affect, talkative and pleasant    Impression & Recommendations:  Problem # 1:  OSTEOARTHRITIS (ICD-715.90) Assessment Deteriorated flared in R knee with hx of OA and gout  wants injection -- which has helped in other knee  avoiding oral nsaids due to HTN  given px for volaren gel to try  ref to new rheum since she cannot see Dr Jon Billings any longer  The following medications were removed from the medication list:    Nabumetone 500 Mg  Tabs (Nabumetone) .Marland Kitchen... Take one tablet by mouth two times daily with food as needed. Her updated medication list for this problem includes:    Tylenol Extra Strength 500 Mg Tabs (Acetaminophen) .Marland Kitchen... As needed    Bayer Aspirin Ec Low Dose 81 Mg Tbec (Aspirin) .Marland Kitchen... 1 daily by mouth  Orders: Rheumatology Referral (Rheumatology)  Problem # 2:  VARICOSE VEINS, LOWER EXTREMITIES  (ICD-454.9) Assessment: Deteriorated below knee compressible/ painful with mild edema no redness/ skin changeor ulceration  no palp cords /heat or homans' sign will try supp hose to knee and update adv pt to watch for redness/ swelling or inc in pain  Complete Medication List: 1)  Paroxetine Hcl 30 Mg Tabs (Paroxetine hcl) .Marland Kitchen.. 1 by mouth once daily 2)  Toprol Xl 50 Mg Tb24 (Metoprolol succinate) .Marland Kitchen.. 1 by mouth once daily 3)  Tylenol Extra Strength 500 Mg Tabs (Acetaminophen) .... As needed 4)  Allopurinol 100 Mg Tabs (Allopurinol) .... 2 by mouth once daily 5)  Calcium 500/d 500-400 Mg-unit Chew (Calcium-vitamin d) .... Take 1 tablet by mouth two times a day 6)  Glucosamine-chondroitin 1500-1200 Mg/67ml Liqd (Glucosamine-chondroitin) .Marland Kitchen.. 1 daily by mouth 7)  Bayer Aspirin Ec Low Dose 81 Mg Tbec (Aspirin) .Marland Kitchen.. 1 daily by mouth 8)  Meclizine Hcl 25 Mg Tabs (Meclizine hcl) .... One tab by mouth three times a day as needed for dizziness/vertigo 9)  Cozaar 100 Mg Tabs (Losartan potassium) .Marland Kitchen.. 1 by mouth once daily 10)  Alprazolam 0.5 Mg Tabs (Alprazolam) .... 1/2 to 1 by mouth up to two times a day as needed severe anxiety 11)  Flonase 50 Mcg/act Susp (Fluticasone propionate) .... 2 sprays in each nostril once daily as needed 12)  Anusol-hc 25 Mg Supp (Hydrocortisone acetate) .Marland Kitchen.. 1 per rectum at bedtime for maximum 10 days for hemorroids as needed 13)  Multivitamins Tabs (Multiple vitamin) .... Take one daily 14)  Support Stockings To Knee-- 15-20 Mm Hg  .... To wear once daily and as needed for leg edema and painful varicose veins 15)  Voltaren 1 % Gel (Diclofenac sodium) .... Apply to affected areas up to four times daily  as needed arthritis pain  Patient Instructions: 1)  we will do rheumatology consult at check out  2)  try the voltaren gel- I sent px to your pharmacy  3)  you can try biotin over the counter for hair but no guarantee it will work  4)  try the support stockings and  let me know if they do not help your veins  Prescriptions: VOLTAREN 1 % GEL (DICLOFENAC SODIUM) apply to affected areas up to four times daily  as needed arthritis pain  #1 large x 3   Entered and Authorized by:   [name] MD   Signed by:   [name] MD on 09/04/2010   Method used:   Electronically to        [facility]* (retail)       [address]       [place], [place]  [address]       Ph: [phone]       Fax: [phone]   RxID:   [id number] SUPPORT STOCKINGS TO KNEE-- 15-20 MM HG to wear once daily and as needed for leg edema and painful varicose veins  #1 pair x 3   Entered and Authorized by:   [name] MD   Signed by:   [name] MD on 09/04/2010   Method used:  Print then Give to Patient   RxID:   [id number]    Orders Added: 1)  Rheumatology Referral [Rheumatology] 2)  Est. Patient Level III [56213]    Current Allergies (reviewed today): ! AUGMENTIN

## 2010-12-04 NOTE — Letter (Signed)
Summary: Primary Care Consult Scheduled Letter  [place] [facility]  [address] [place], [place] [address]   Phone: [phone]  Fax: [phone]      09/08/2010 MRN: [id number]  [facility] [address] [place], [place]  [address]    Dear Michelle Gregory,      We have scheduled an appointment for you.  At the recommendation of Dr.Marne Tower , we have scheduled you a consult with Rheumatologist,Dr Stacey Drain on [place], 28th at 2:30pm.  Their address is [address] [place], [place] [address].. The office phone number is [phone].  If this appointment day and time is not convenient for you, please feel free to call the office of the doctor you are being referred to at the number listed above and reschedule the appointment.     It is important for you to keep your scheduled appointments. We are here to make sure you are given good patient care. If you have questions or you have made changes to your appointment, please notify us at  [phone]         , ask for              .    Thank you,  Patient Care Coordinator [place] at [facility]

## 2010-12-04 NOTE — Progress Notes (Signed)
Summary: wants written script for allopurinol  Phone Note Call from Patient Call back at Home Phone [phone]   Caller: Patient Call For: dr Aristide Waggle Summary of Call: Pt wants 90 day written script for allopurinol  for mail order, she wants this mailed to her, she doesnt want to come in to pick it up and risk getting sick. Initial call taken by: Lowella Petties,  September 10, 2008 1:33 PM  Follow-up for Phone Call        will do px f/u in spring please for visit and labs Follow-up by: [name] MD,  September 11, 2008 7:39 AM  Additional Follow-up for Phone Call Additional follow up Details #1::        rx mailed [facility]  September 11, 2008 8:13 AM     New/Updated Medications: ALLOPURINOL 100 MG  TABS (ALLOPURINOL) 2 by mouth once daily   Prescriptions: ALLOPURINOL 100 MG  TABS (ALLOPURINOL) 2 by mouth once daily  #180 x 3   Entered and Authorized by:   [name] MD   Signed by:   [name] MD on 09/11/2008   Method used:   Print then Give to Patient   RxID:   [id number]

## 2010-12-04 NOTE — Assessment & Plan Note (Signed)
Summary: 6 MTH FU/CLE   Vital Signs:  Patient profile:   77 year old female Height:      63 inches Weight:      166.25 pounds Temp:     98 degrees F oral Pulse rate:   96 / minute Pulse rhythm:   regular BP sitting:   146 / 80  (left arm) Cuff size:   regular  Vitals Entered By: Lewanda Rife LPN (January 08, 4097 11:58 AM)  Serial Vital Signs/Assessments:  Time      Position  BP       Pulse  Resp  Temp     By                     130/80                         [name] MD   History of Present Illness: is here for 6 mo f/u of HTN and  lipids   had a hard winter with a torn meniscus-- that is getting better without surgery  wants to avoid surgery    lipids went up (trig 252) and down (LDL 98) diet is good for fats really well   no exercise since her joints have hurt - esp her knee   also sugar was 113 - which is a bit high for her  no DM in her family  she did drink a lot of hot tea -- changed from sugar to honey  not a sweet eater - but does eat a lot bread  no numbness or tingling no blurred vision no excessive thirst or urination   bp is 146/80 on first check has been well controlled- better on second check  wt is up 3 lb   stopped her nambumetone due to her bp for a while  went back on it    Allergies: 1)  ! Augmentin  Past History:  Past Medical History: Last updated: 01-11-09 Allergic rhinitis Anxiety Colon cancer, hx of Depression GERD Gout Hyperlipidemia Hypertension Osteoarthritis vertigo  Past Surgical History: Last updated: 05/09/2008 Colon resection Appendectomy Cataract extraction Tonsillectomy Hosp- withdrawel off lorazepam Dexa- osteopenia (2002) LVH, hyperdynamic LVF 86%- persentine cardiolite (01/1997) Colonoscopy- neg (10/2002) LS- facet point deg change (10/205) Abd [place]- neg (10/2005) Old compression fracture- spine Fall- fractured wrist, concussion (11/2006)  Family History: Last updated: 01/11/09 Father:  deceased - prostate cancer age 12 Mother: MI, ? gout  Social History: Last updated: Jan 11, 2009 Marital Status:widowed Children: 1 daughter Occupation: retired from [employer] [place] [employer] [place] non smoker non drinker  Review of Systems General:  Complains of fatigue; denies loss of appetite, malaise, and weight loss. Eyes:  Denies blurring and eye irritation. CV:  Denies chest pain or discomfort, palpitations, and shortness of breath with exertion. Resp:  Denies cough and wheezing. GI:  Denies change in bowel habits, indigestion, and nausea. GU:  Denies urinary frequency. MS:  Complains of joint pain and stiffness; denies cramps and muscle weakness. Derm:  Denies itching, lesion(s), poor wound healing, and rash. Neuro:  Denies numbness and tingling. Endo:  Denies excessive thirst and excessive urination. Heme:  Denies abnormal bruising and bleeding.  Physical Exam  General:  overweight but generally well appearing  Head:  normocephalic, atraumatic, and no abnormalities observed.   Eyes:  vision grossly intact, pupils equal, pupils round, and pupils reactive to light.  no conjunctival pallor, injection or icterus  Mouth:  pharynx pink  and moist.   Neck:  supple with full rom and no masses or thyromegally, no JVD or carotid bruit  Chest Wall:  No deformities, masses, or tenderness noted. Lungs:  Normal respiratory effort, chest expands symmetrically. Lungs are clear to auscultation, no crackles or wheezes. Heart:  Normal rate and regular rhythm. S1 and S2 normal without gallop, murmur, click, rub or other extra sounds. Abdomen:  soft and non-tender.   no renal bruits  Msk:  swelling L knee with poor rom  Pulses:  R and L carotid,radial,femoral,dorsalis pedis and posterior tibial pulses are full and equal bilaterally Extremities:  No clubbing, cyanosis, edema, or deformity noted with normal full range of motion of all joints.   Neurologic:  sensation intact to light touch, gait normal, and  DTRs symmetrical and normal.   Skin:  Intact without suspicious lesions or rashes baseline fair complexion Cervical Nodes:  No lymphadenopathy noted Psych:  normal affect, talkative and pleasant  is mentally sharp    Impression & Recommendations:  Problem # 1:  HYPERTENSION (ICD-401.9) Assessment Unchanged  bp better on second check  disc using nsaid only of abs necessary- since it does inc bp  disc healthy diet (low simple sugar/ choose complex carbs/ low sat fat) diet and exercise in detail  lab and f/u 6 mo  rev labs today Her updated medication list for this problem includes:    Toprol Xl 50 Mg Tb24 (Metoprolol succinate) .Marland Kitchen... 1 by mouth once daily    Cozaar 100 Mg Tabs (Losartan potassium) .Marland Kitchen... 1 by mouth once daily  BP today: 146/80-- re check 130/80 Prior BP: 140/88 (07/15/2009)  Labs Reviewed: K+: 4.4 (01/06/2010) Creat: : 0.8 (01/06/2010)   Chol: 160 (01/06/2010)   HDL: 34.20 (01/06/2010)   LDL: DEL (12/01/2006)   TG: 252.0 (01/06/2010)  Problem # 2:  HYPERLIPIDEMIA (ICD-272.4) Assessment: Unchanged  LDL is better but trig are up (I suspect poss from hyperglycemia ) will watch sugar and fat in diet and re check in 6 mo  rev sat fats in diet as well overall habits are good   Labs Reviewed: SGOT: 21 (01/06/2010)   SGPT: 15 (01/06/2010)   HDL:34.20 (01/06/2010), 30.90 (07/10/2009)  LDL:DEL (12/01/2006)  Chol:160 (01/06/2010), 180 (07/10/2009)  Trig:252.0 (01/06/2010), 225.0 (07/10/2009)  Problem # 3:  HYPERGLYCEMIA, FASTING (ICD-790.29) Assessment: New mild - but warrants following (esp with high trig) disc poss of borderline DM plan to check AIC 6 mo disc healthy diet (low simple sugar/ choose complex carbs/ low sat fat) diet and exercise in detail  will cut carbs and sugar   Complete Medication List: 1)  Paroxetine Hcl 30 Mg Tabs (Paroxetine hcl) .Marland Kitchen.. 1 by mouth once daily 2)  Toprol Xl 50 Mg Tb24 (Metoprolol succinate) .Marland Kitchen.. 1 by mouth once daily 3)   Tylenol Extra Strength 500 Mg Tabs (Acetaminophen) .... As needed 4)  Allopurinol 100 Mg Tabs (Allopurinol) .... 2 by mouth once daily 5)  Calcium 500/d 500-400 Mg-unit Chew (Calcium-vitamin d) .... Take 1 tablet by mouth two times a day 6)  Glucosamine-chondroitin 1500-1200 Mg/73ml Liqd (Glucosamine-chondroitin) .Marland Kitchen.. 1 daily by mouth 7)  Bayer Aspirin Ec Low Dose 81 Mg Tbec (Aspirin) .Marland Kitchen.. 1 daily by mouth 8)  Meclizine Hcl 25 Mg Tabs (Meclizine hcl) .... One tab by mouth three times a day as needed for dizziness/vertigo 9)  Cozaar 100 Mg Tabs (Losartan potassium) .Marland Kitchen.. 1 by mouth once daily 10)  Alprazolam 0.5 Mg Tabs (Alprazolam) .... 1/2 to 1 by mouth up  to two times a day as needed severe anxiety 11)  Flonase 50 Mcg/act Susp (Fluticasone propionate) .... 2 sprays in each nostril once daily as needed 12)  Anusol-hc 25 Mg Supp (Hydrocortisone acetate) .Marland Kitchen.. 1 per rectum at bedtime for maximum 10 days for hemorroids as needed 13)  Nabumetone 500 Mg Tabs (Nabumetone) .... Take one tablet by mouth two times daily with food as needed. 14)  Multivitamins Tabs (Multiple vitamin) .... Take one daily 15)  Alfalfa Complex Otc  .... Otc as directed.  Patient Instructions: 1)  only take the nambumetone if you absolutely have to because it raises your blood pressure 2)  try to watch sugar in diet -- switch to artificial sweetner in tea  3)  avoid sweets  4)  cut down your bread and choose brown bread  5)  schedule fastiing labs and follow up in 6 months lipid/ast/alt/AIC/ renal for 272 and hyperglycemia   Current Allergies (reviewed today): ! AUGMENTIN

## 2010-12-04 NOTE — Progress Notes (Signed)
Summary: Still has vertigo  Phone Note Call from Patient   Caller: Patient Call For: [name] MD Summary of Call: Patient has been on medication for vertigo and has used it all up and is not completely over it.  Patient states that she is better, but is still having some vertigo and wants to know if she can take benadyl or do you want to refill her medication for the vertigo.  Patient has to go to a funeral today at 4:00 and would like to know something before that time. Initial call taken by: Sydell Axon,  January 07, 2009 12:06 PM  Follow-up for Phone Call        can refil meclizine- px written on EMR for call in (use with caution, can sedate) update me if generally not improving in next week  Follow-up by: [name] MD,  January 07, 2009 1:54 PM  Additional Follow-up for Phone Call Additional follow up Details #1::        Advised patient.  ......................................................Marland KitchenNatasha Chavers January 07, 2009 2:27 PM     New/Updated Medications: MECLIZINE HCL 25 MG TABS (MECLIZINE HCL) one tab by mouth three times a day as needed for dizziness/vertigo   Prescriptions: MECLIZINE HCL 25 MG TABS (MECLIZINE HCL) one tab by mouth three times a day as needed for dizziness/vertigo  #30 x 0   Entered by:   Liane Comber   Authorized by:   [name] MD   Signed by:   Liane Comber on 01/07/2009   Method used:   Electronically to        [facility]  [address] [phone]* (retail)       [address]       [place], [place]  [address]       Ph: [phone] or [phone]       Fax: [phone]   RxID:   [id number] MECLIZINE HCL 25 MG TABS (MECLIZINE HCL) one tab by mouth three times a day as needed for dizziness/vertigo  #30 x 0   Entered by:   Liane Comber   Authorized by:   [name] MD   Signed by:   Liane Comber on 01/07/2009   Method used:   Electronically to        [facility]* (retail)        [address]       [place], [place]  [address]       Ph: [phone]       Fax: [phone]   RxID:   [id number]

## 2010-12-04 NOTE — Progress Notes (Signed)
Summary: refill request for cozaar  Phone Note Refill Request Message from:  Fax from Pharmacy  Refills Requested: Medication #1:  COZAAR 50 MG TABS 1 by mouth once daily Faxed form from [facility] is on your shelf.  Initial call taken by: Lowella Petties,  January 17, 2009 4:29 PM  Follow-up for Phone Call        will refil cozaar-px written on EMR for call in  please check with her and update me with how her vertigo symptoms are- thanks  Follow-up by: [name] MD,  January 17, 2009 4:33 PM  Additional Follow-up for Phone Call Additional follow up Details #1::        Rx faxed to pharmacy Additional Follow-up by: Liane Comber,  January 18, 2009 9:21 AM    Additional Follow-up for Phone Call Additional follow up Details #2::    pt says  she is much better. Natasha Chavers  January 18, 2009 9:30 AM     Prescriptions: COZAAR 50 MG TABS (LOSARTAN POTASSIUM) 1 by mouth once daily  #90 x 3   Entered and Authorized by:   [name] MD   Signed by:   [name] MD on 01/17/2009   Method used:   Printed then faxed to ...       [facility] [address] [address] [phone][facility] (retail)       [address]       [place], [place]  [address]       Ph: [phone] or [phone]       Fax: [phone]   RxID:   [id number]

## 2010-12-04 NOTE — Progress Notes (Signed)
Summary: Rx Paroxetine, Toprol, & Allopurinol  Phone Note Refill Request Call back at [phone] Message from:  [facility] on December 17, 2009 9:55 AM  Refills Requested: Medication #1:  PAROXETINE HCL 30 MG  TABS 1 by mouth once daily   Supply Requested: 3 months  Medication #2:  TOPROL XL 50 MG  TB24 1 by mouth once daily   Supply Requested: 3 months  Medication #3:  ALLOPURINOL 100 MG  TABS 2 by mouth once daily   Supply Requested: 3 months Received faxed refill request, please advise.  Form in your IN box   Method Requested: Fax to Mail Away Pharmacy Initial call taken by: Linde Gillis CMA Duncan Dull),  December 17, 2009 9:56 AM  Follow-up for Phone Call        form done and in nurse in box  Follow-up by: [name] MD,  December 17, 2009 1:30 PM  Additional Follow-up for Phone Call Additional follow up Details #1::        completed form faxed to [phone] instructed.Lewanda Rife LPN  December 17, 2009 2:28 PM     New/Updated Medications: TOPROL XL 50 MG  TB24 (METOPROLOL SUCCINATE) 1 by mouth once daily ALLOPURINOL 100 MG  TABS (ALLOPURINOL) 2 by mouth once daily Prescriptions: ALLOPURINOL 100 MG  TABS (ALLOPURINOL) 2 by mouth once daily  #180 x 3   Entered and Authorized by:   [name] MD   Signed by:   Lewanda Rife LPN on 86/57/8469   Method used:   Historical   RxID:   [id number] TOPROL XL 50 MG  TB24 (METOPROLOL SUCCINATE) 1 by mouth once daily  #90 x 3   Entered and Authorized by:   [name] MD   Signed by:   Lewanda Rife LPN on 72/53/6644   Method used:   Historical   RxID:   [id number] PAROXETINE HCL 30 MG  TABS (PAROXETINE HCL) 1 by mouth once daily  #90 x 3   Entered and Authorized by:   [name] MD   Signed by:   Lewanda Rife LPN on 33/29/5188   Method used:   Historical   RxID:   [id number]

## 2010-12-04 NOTE — Consult Note (Signed)
Summary: [facility]/Dr. Deveshwar  [facility]/Dr. Deveshwar   Imported By: Eleonore Chiquito 05/09/2008 09:19:41  _____________________________________________________________________  External Attachment:    Type:   Image     Comment:   External Document

## 2010-12-04 NOTE — Progress Notes (Signed)
Summary: wants to have thyroid checked  Phone Note Call from Patient Call back at Home Phone [phone]   Caller: Patient Call For: [name] Summary of Call: Pt complains of feeling tired and her hair is coming out.  She would like to have labs drawn to check her thyroid. Initial call taken by: Lowella Petties,  December 12, 2008 11:38 AM  Follow-up for Phone Call        please schedule in for TSH--fatigue--780.09  Michelle Tyler Deis FNP  December 12, 2008 12:16 PM   Additional Follow-up for Phone Call Additional follow up Details #1::        Patient notified as instructed.  Lab appt scheduled for 12/13/08 at 10:00 AM. Additional Follow-up by: Sydell Axon,  December 12, 2008 12:33 PM

## 2010-12-04 NOTE — Assessment & Plan Note (Signed)
Summary: ARM PAIN/DLO  Medications Added TYLENOL EXTRA STRENGTH 500 MG  TABS (ACETAMINOPHEN) as needed ALLOPURINOL 100 MG  TABS (ALLOPURINOL) 2 q d CALCIUM 500/D 500-400 MG-UNIT  CHEW (CALCIUM-VITAMIN D) Take 1 tablet by mouth two times a day INDOCIN 25 MG/5ML  SUSP (INDOMETHACIN) 1 tsp q8h prnpain      Allergies Added: NKDA  Vital Signs:  Patient Profile:   77 Years Old Female Weight:      164 pounds Temp:     98.6 degrees F oral Pulse rate:   68 / minute BP sitting:   150 / 84  (right arm) Cuff size:   regular  Vitals Entered By: Cooper Render (October 12, 2007 11:08 AM)                 Chief Complaint:  L) elbow pain & swelling and warm to touch.  History of Present Illness: Here due to L elbow pain and swelling for 1-2d, had noted some milder edema for  ~64mo but increased in the past few days.  Taking nothing  for this.   Has hx of gout.  Current Allergies (reviewed today): No known allergies  Updated/Current Medications (including changes made in today's visit):  HYZAAR 100-25 MG  TABS (LOSARTAN POTASSIUM-HCTZ) 1 by mouth qd PAROXETINE HCL 30 MG  TABS (PAROXETINE HCL) 1 by mouth qd TOPROL XL 50 MG  TB24 (METOPROLOL SUCCINATE) 1 by mouth qd NABUMETONE 500 MG  TABS (NABUMETONE) 1 by mouth two times a day with food prn TYLENOL EXTRA STRENGTH 500 MG  TABS (ACETAMINOPHEN) as needed ALLOPURINOL 100 MG  TABS (ALLOPURINOL) 2 q d CALCIUM 500/D 500-400 MG-UNIT  CHEW (CALCIUM-VITAMIN D) Take 1 tablet by mouth two times a day INDOCIN 25 MG/5ML  SUSP (INDOMETHACIN) 1 tsp q8h prnpain      Review of Systems      See HPI   Physical Exam  General:     alert, well-developed, and well-nourished.  NAD Lungs:     normal respiratory effort.   Msk:     full ROM L elbow with no pain, as well as wrist. Skin:     edema and warmth over lateral epicondyle L elbow Psych:     normally interactive.      Impression & Recommendations:  Problem # 1:  ELBOW PAIN, LEFT  (ZOX-096.04) Assessment: New new pain and sewlling with increased warmth over lateral epicondyle of L elbow Xray reveals the edema and irregular head of the epicondyle--Dr Hetty Ely viewed with me will refer t Ortho for eval Indocin 25mg  q8h with food as needed see back as needed Orders: Orthopedic Surgeon Referral (Ortho Surgeon) Radiology other (Radiology Other) Orthopedic Surgeon Referral (Ortho Surgeon)   Complete Medication List: 1)  Hyzaar 100-25 Mg Tabs (Losartan potassium-hctz) .Marland Kitchen.. 1 by mouth qd 2)  Paroxetine Hcl 30 Mg Tabs (Paroxetine hcl) .Marland Kitchen.. 1 by mouth qd 3)  Toprol Xl 50 Mg Tb24 (Metoprolol succinate) .Marland Kitchen.. 1 by mouth qd 4)  Nabumetone 500 Mg Tabs (Nabumetone) .Marland Kitchen.. 1 by mouth two times a day with food prn 5)  Tylenol Extra Strength 500 Mg Tabs (Acetaminophen) .... As needed 6)  Allopurinol 100 Mg Tabs (Allopurinol) .... 2 q d 7)  Calcium 500/d 500-400 Mg-unit Chew (Calcium-vitamin d) .... Take 1 tablet by mouth two times a day 8)  Indocin 25 Mg/64ml Susp (Indomethacin) .Marland Kitchen.. 1 tsp q8h prnpain  Other Orders: Radiology other (Radiology Other)     Prescriptions: INDOCIN 25 MG/5ML  SUSP (INDOMETHACIN) 1  tsp q8h prnpain  #164ml x 1   Entered and Authorized by:   Gildardo Griffes FNP   Signed by:   Gildardo Griffes FNP on 10/12/2007   Method used:   Print then Give to Patient   RxID:   [id number]  ]

## 2010-12-04 NOTE — Letter (Signed)
Summary: [facility]  [facility]   Imported By: Maryln Gottron 05/09/2009 15:20:48  _____________________________________________________________________  External Attachment:    Type:   Image     Comment:   External Document

## 2010-12-04 NOTE — Assessment & Plan Note (Signed)
Summary: high bp/tsc   Vital Signs:  Patient Profile:   77 Years Old Female Weight:      163 pounds Temp:     97.5 degrees F oral Pulse rate:   72 / minute Pulse rhythm:   regular BP sitting:   178 / 90  (left arm) Cuff size:   regular  Vitals Entered By: Liane Comber (December 31, 2008 3:29 PM)                 Chief Complaint:  high bp.  History of Present Illness: used to be on hyzaar for HTN- stopped it due to gout and hypotension by Dr Devishwar \\par  initially bp was good - until last friday when she got vertigo  went to hosp last friday-- after severe dizziness after getting hair done  had CT and work up in ER - all was ok - told it was vertigo  bp was quite high in the ER - when she was dizzy  is more anxious lately as well - wants something extra for this if possible ? 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 AUGMENTIN  Past Medical History:    Allergic rhinitis    Anxiety    Colon cancer, hx of    Depression    GERD    Gout    Hyperlipidemia    Hypertension    Osteoarthritis    vertigo  Past Surgical History:    Reviewed history from 05/09/2008 and no changes required:       Colon resection       Appendectomy       Cataract extraction       Tonsillectomy       Hosp- withdrawel off lorazepam       Dexa- osteopenia (2002)       LVH, hyperdynamic LVF 86%- persentine cardiolite (01/1997)       Colonoscopy- neg (10/2002)       LS- facet point deg change (10/205)       Abd  [place]- neg (10/2005)       Old compression fracture- spine       Fall- fractured wrist, concussion (11/2006)   Family History:    Reviewed history from 05/09/2008 and no changes required:       Father: deceased - prostate cancer age 54       Mother: MI, ? gout  Social History:    Marital Status:widowed    Children: 1 daughter    Occupation: retired from [employer]    non smoker    non drinker    Review of Systems  General      Complains of fatigue.      Denies loss of appetite and malaise.  Eyes      Denies blurring and double vision.  ENT      Complains of nasal  congestion and postnasal drainage.      Denies sinus pressure and sore throat.  CV      Denies chest pain or discomfort, palpitations, shortness of breath with exertion, and swelling of feet.  Resp      Denies cough and wheezing.  GI      Denies bloody stools, change in bowel habits, nausea, and vomiting.  MS      Complains of joint pain.      Denies joint swelling.  Derm      Denies itching and rash.  Neuro      Complains of sensation of room spinning.      Denies headaches, tingling, tremors, visual disturbances, and weakness.  Psych      Complains of anxiety.      Denies depression, panic attacks, sense of great danger, and suicidal thoughts/plans.  Endo      Denies excessive thirst and excessive urination.   Physical Exam  General:     Well-developed,well-nourished,in no acute distress; alert,appropriate and cooperative throughout examination Head:     normocephalic, atraumatic, and no abnormalities observed.  no sinus or TA tenderness Eyes:     vision grossly intact, pupils equal, pupils round, and pupils reactive to light.  no conjunctival pallor, injection or icterus  no nystagmus today Ears:     R ear normal and L ear normal.   Nose:     nares are mildly injected and congested bilaterally Mouth:     pharynx pink and moist, no erythema, and no exudates.   Neck:     supple  with full rom and no masses or thyromegally, no JVD or carotid bruit  Chest Wall:     No deformities, masses, or tenderness noted. Lungs:     Normal respiratory effort, chest expands symmetrically. Lungs are clear to auscultation, no crackles or wheezes. Heart:     Normal rate and regular rhythm. S1 and S2 normal without gallop, murmur, click, rub or other extra sounds. Abdomen:     soft, non-tender, and normal bowel sounds.   Pulses:     R and L carotid,radial,femoral,dorsalis pedis and posterior tibial pulses are full and equal bilaterally Extremities:     No clubbing, cyanosis, edema, or deformity noted with normal full range of motion of all joints.   Neurologic:     alert & oriented X3, cranial nerves II-XII intact, strength normal in all extremities, sensation intact to light touch, DTRs symmetrical and normal, and Romberg negative.   Skin:     Intact without suspicious lesions or rashes Cervical Nodes:     No lymphadenopathy noted Psych:     normal affect, talkative and pleasant - does not seem overly anx today    Impression & Recommendations:  Problem # 1:  HYPERTENSION (ICD-401.9) Assessment: Deteriorated recently worsening  start on cozaar (done well in past with hyzaar- but avoiding the hctz due to gout) update if side eff- will keep watch on bp  adv to d/c nambumetone if poss- since this may aff HTN and CV risks  adv healthy habits- low salt diet/ exercise as tol f/u 4-6 weeks labs today  Her updated medication list for this problem includes:    Toprol Xl 50 Mg Tb24 (Metoprolol succinate) .Marland Kitchen... 1 by mouth once daily    Cozaar 50 Mg Tabs (Losartan potassium) .Marland Kitchen... 1 by mouth once daily  Orders: Venipuncture (16109) TLB-Renal Function Panel (80069-RENAL) TLB-Hepatic/Liver Function Pnl (80076-HEPATIC) TLB-CBC Platelet - w/Differential (85025-CBCD)   Problem #  2:  ANXIETY (ICD-300.00) Assessment: Deteriorated worse lately- may affect bp also  given px for  alprazolam to use sparingly for severe anx, continue paxil and disc further at f/u disc situational stress/ coping strategies/ symptoms and poss tx in detail today Her updated medication list for this problem includes:    Paroxetine Hcl 30 Mg Tabs (Paroxetine hcl) .Marland Kitchen... 1 by mouth once daily    Alprazolam 0.5 Mg Tabs (Alprazolam) .Marland Kitchen... 1/2 to 1 by mouth up to two times a day as needed severe anxiety   Problem # 3:  VERTIGO (ICD-780.4) Assessment: New improved with time and meclizine- poss linked to recent viral uri, with no acute neuro changes will continue meclizine as needed  sent for CT report  update if worse or persistant  Her updated medication list for this problem includes:    Meclizine Hcl 25 Mg Tabs (Meclizine hcl) ..... One tab by mouth three times a day as needed   Complete Medication List: 1)  Paroxetine Hcl 30 Mg Tabs (Paroxetine hcl) .Marland Kitchen.. 1 by mouth once daily 2)  Toprol Xl 50 Mg Tb24 (Metoprolol succinate) .Marland Kitchen.. 1 by mouth once daily 3)  Nabumetone 500 Mg Tabs (Nabumetone) .... One by mouth two times a day as needed with food (holding for htn 2/10) 4)  Tylenol Extra Strength 500 Mg Tabs (Acetaminophen) .... As needed 5)  Allopurinol 100 Mg Tabs (Allopurinol) .... 2 by mouth once daily 6)  Calcium 500/d 500-400 Mg-unit Chew (Calcium-vitamin d) .... Take 1 tablet by mouth two times a day 7)  Glucosamine-chondroitin 1500-1200 Mg/34ml Liqd (Glucosamine-chondroitin) .Marland Kitchen.. 1 daily by mouth 8)  Bayer Aspirin Ec Low Dose 81 Mg Tbec (Aspirin) .Marland Kitchen.. 1 daily by mouth 9)  Meclizine Hcl 25 Mg Tabs (Meclizine hcl) .... One tab by mouth three times a day as needed 10)  Mucinex 600 Mg Xr12h-tab (Guaifenesin) .... As needed 11)  Cozaar 50 Mg Tabs (Losartan potassium) .Marland Kitchen.. 1 by mouth once daily 12)  Alprazolam 0.5 Mg Tabs (Alprazolam) .... 1/2 to 1 by mouth up to two times a day as needed severe anxiety   Patient Instructions: 1)  please send for CT report from the ER recently 2)  stop  your nambumetone because it may be raising blood pressure  3)  labs today  4)  start cozaar for blood pressure  5)  follow up with me in 4-6 weeks for blood pressure  6)  update me if your vertigo does start to improve in the next week- only take the meclizine if you need it  7)  use xanax with caution - for severe anxiety- caution because it can sedate and become habit forming   Prescriptions: ALPRAZOLAM 0.5 MG TABS (ALPRAZOLAM) 1/2 to 1 by mouth up to two times a day as needed severe anxiety  #30 x 1   Entered and Authorized by:   [name] MD   Signed by:   [name] MD on 12/31/2008   Method used:   Print then Give to Patient   RxID:   [id number] COZAAR 50 MG TABS (LOSARTAN POTASSIUM) 1 by mouth once daily  #90 x 3   Entered and Authorized by:   [name] MD   Signed by:   [name] MD on 12/31/2008   Method used:   Print then Give to Patient   RxID:   [id number]   Prior Medications (reviewed today): PAROXETINE HCL 30 MG  TABS (PAROXETINE HCL) 1 by mouth once  daily TOPROL XL 50 MG  TB24 (METOPROLOL SUCCINATE) 1 by mouth once daily TYLENOL EXTRA STRENGTH 500 MG  TABS (ACETAMINOPHEN) as needed ALLOPURINOL 100 MG  TABS (ALLOPURINOL) 2 by mouth once daily CALCIUM 500/D 500-400 MG-UNIT  CHEW (CALCIUM-VITAMIN D) Take 1 tablet by mouth two times a day GLUCOSAMINE-CHONDROITIN 1500-1200 MG/30ML  LIQD (GLUCOSAMINE-CHONDROITIN) 1 DAILY BY MOUTH BAYER ASPIRIN EC LOW DOSE 81 MG  TBEC (ASPIRIN) 1 DAILY BY MOUTH MECLIZINE HCL 25 MG TABS (MECLIZINE HCL) one tab by mouth three times a day as needed MUCINEX 600 MG XR12H-TAB (GUAIFENESIN) as needed Current Allergies (reviewed today): ! AUGMENTIN Current Medications (including changes made in today's visit):  PAROXETINE HCL 30 MG  TABS (PAROXETINE HCL) 1 by mouth once daily TOPROL XL 50 MG  TB24 (METOPROLOL SUCCINATE) 1 by mouth once daily NABUMETONE 500 MG  TABS (NABUMETONE) one by mouth two times a day as  needed with food (HOLDING FOR HTN 2/10) TYLENOL EXTRA STRENGTH 500 MG  TABS (ACETAMINOPHEN) as needed ALLOPURINOL 100 MG  TABS (ALLOPURINOL) 2 by mouth once daily CALCIUM 500/D 500-400 MG-UNIT  CHEW (CALCIUM-VITAMIN D) Take 1 tablet by mouth two times a day GLUCOSAMINE-CHONDROITIN 1500-1200 MG/30ML  LIQD (GLUCOSAMINE-CHONDROITIN) 1 DAILY BY MOUTH BAYER ASPIRIN EC LOW DOSE 81 MG  TBEC (ASPIRIN) 1 DAILY BY MOUTH MECLIZINE HCL 25 MG TABS (MECLIZINE HCL) one tab by mouth three times a day as needed MUCINEX 600 MG XR12H-TAB (GUAIFENESIN) as needed COZAAR 50 MG TABS (LOSARTAN POTASSIUM) 1 by mouth once daily ALPRAZOLAM 0.5 MG TABS (ALPRAZOLAM) 1/2 to 1 by mouth up to two times a day as needed severe anxiety

## 2010-12-04 NOTE — Assessment & Plan Note (Signed)
Summary: roa/f/u see triage note/cmt   Vital Signs:  Patient Profile:   77 Years Old Female Weight:      165 pounds Temp:     97.8 degrees F oral Pulse rate:   76 / minute Pulse rhythm:   regular BP sitting:   140 / 80  (left arm) Cuff size:   large  Vitals Entered By: Providence Crosby (May 09, 2008 2:52 PM)                 Chief Complaint:  FOLLOWUP DR. DEVISHWAR.  History of Present Illness: is having a lot of trouble with gout  has large tophi on feet which are painful and disabling   has been on hyzaar for a while -- bp was 90/60 in her office - ? if she could stop it  no dizziness or side eff  has been compliant with meds   also suggested that she may not need paroxetine  pt does not remember discussing it with her   thinks her mood has been pretty good  would rather not stop the paroxetine   has not been able to exercise because of foot pain from gout did get some inserts from the [employer] -- no help         Prior Medications Reviewed Using: Medication Bottles  Current Allergies: ! AUGMENTIN  Past Medical History:    Allergic rhinitis    Anxiety    Colon cancer, hx of    Depression    GERD    Gout    Hyperlipidemia    Hypertension    Osteoarthritis  Past Surgical History:    Colon resection    Appendectomy    Cataract extraction    Tonsillectomy    Hosp- withdrawel off lorazepam    Dexa- osteopenia (2002)    LVH, hyperdynamic LVF 86%- persentine cardiolite (01/1997)    Colonoscopy- neg (10/2002)    LS- facet point deg change (10/205)    Abd [place]- neg (10/2005)    Old compression fracture- spine    Fall- fractured wrist, concussion (11/2006)   Family History:    Father: deceased - prostate cancer age 50    Mother: MI, ? gout    Siblings:   Social History:    Marital Status:widowed    Children: 1 daughter    Occupation: retired from [employer]    Review of Systems  General      Denies fatigue, loss of appetite, and  malaise.  Eyes      Denies blurring.  CV      Complains of swelling of feet.      Denies chest pain or discomfort, palpitations, and shortness of breath with exertion.  Resp      Denies cough and shortness of breath.  MS      Complains of joint pain, joint redness, joint swelling, and stiffness.  Derm      Denies itching and rash.  Psych      mood ok on paxil-- wants to stay on   Physical Exam  General:     overwt but well appearing Head:     normocephalic, atraumatic, and no abnormalities observed.   Eyes:     vision grossly intact, pupils equal, pupils round, and pupils reactive to light.   Mouth:     MMM, throat clear  Neck:     supple with full rom and no masses or thyromegally, no JVD or carotid bruit  Chest  Wall:     No deformities, masses, or tenderness noted. Lungs:     Normal respiratory effort, chest expands symmetrically. Lungs are clear to auscultation, no crackles or wheezes. Heart:     Normal rate and regular rhythm. S1 and S2 normal without gallop, murmur, click, rub or other extra sounds. Abdomen:     Bowel sounds positive,abdomen soft and non-tender without masses, organomegaly or hernias noted.  no renal bruits  Msk:     No deformity or scoliosis noted of thoracic or lumbar spine.   notalbe yellow gouty tophi on feet- tender  Pulses:     R and L carotid,radial,femoral,dorsalis pedis and posterior tibial pulses are full and equal bilaterally Extremities:     No clubbing, cyanosis, edema, or deformity noted with normal full range of motion of all joints.   Neurologic:     sensation intact to light touch, gait normal, and DTRs symmetrical and normal.   Skin:     Intact without suspicious lesions or rashes Cervical Nodes:     No lymphadenopathy noted Psych:     mildly irritable today-- good communication skills mentally very sharp    Impression & Recommendations:  Problem # 1:  HYPERTENSION, ESSENTIAL NOS (ICD-401.9) Assessment: Unchanged  has been well controlled and occasionally low will hold hyzaar currently in interest of improving gout symptoms if bp inc-- will plan to start plain cozaar pt inst to stop hyzaar and check bp when able f/u 3 mo pending labs from Dr Jon Billings The following medications were removed from the medication list:    Hyzaar 100-25 Mg Tabs (Losartan potassium-hctz) .Marland Kitchen... 1 by mouth once daily (holding for gout 7/09)  Her updated medication list for this problem includes:    Toprol Xl 50 Mg Tb24 (Metoprolol succinate) .Marland Kitchen... 1 by mouth qd  BP today: 140/80 Prior BP: 150/84 (10/12/2007)  Labs Reviewed: Chol: 195 (12/01/2006)   HDL: 30.7 (12/01/2006)   LDL: DEL (12/01/2006)   TG: 265 (12/01/2006)   Problem # 2:  GOUT (ICD-274.9) Assessment: Deteriorated moderate with tophi in feet will stop hyzaar continue allopurinol and f/u Dr Jon Billings pend recent labs The following medications were removed from the medication list:    Indocin 25 Mg/62ml Susp (Indomethacin) .Marland Kitchen... 1 tsp q8h prnpain  Her updated medication list for this problem includes:    Nabumetone 500 Mg Tabs (Nabumetone) .Marland Kitchen... 1 by mouth once daily    Allopurinol 100 Mg Tabs (Allopurinol) .Marland Kitchen... 2 q d   Complete Medication List: 1)  Paroxetine Hcl 30 Mg Tabs (Paroxetine hcl) .Marland Kitchen.. 1 by mouth once daily 2)  Toprol Xl 50 Mg Tb24 (Metoprolol succinate) .Marland Kitchen.. 1 by mouth qd 3)  Nabumetone 500 Mg Tabs (Nabumetone) .Marland Kitchen.. 1 by mouth once daily 4)  Tylenol Extra Strength 500 Mg Tabs (Acetaminophen) .... As needed 5)  Allopurinol 100 Mg Tabs (Allopurinol) .... 2 q d 6)  Calcium 500/d 500-400 Mg-unit Chew (Calcium-vitamin d) .... Take 1 tablet by mouth two times a day 7)  Glucosamine-chondroitin 1500-1200 Mg/54ml Liqd (Glucosamine-chondroitin) .Marland Kitchen.. 1 daily by mouth 8)  Bayer Aspirin Ec Low Dose 81 Mg Tbec (Aspirin) .Marland Kitchen.. 1 daily by mouth 9)  Multivitamins Tabs (Multiple vitamin) .Marland Kitchen.. 1 daily by mouth   Patient Instructions: 1)  stop your hyzaar  -- hold it  2)  drink plenty of water  3)  continue your paxil/paroxetine 4)  keep watching your diet 5)  elevate feet if you get swelling  6)  if your blood pressure remains well  controlled off the hyzaar-- will stay off of it 7)  if your blood pressure increases off the hyzaar- we will start cozaaar (which is the hyzaar without the fluid pill) 8)  check blood pressure when you can -- update me if it is staying above 140/90 9)  follow up with me in about 3 months    Prescriptions: PAROXETINE HCL 30 MG  TABS (PAROXETINE HCL) 1 by mouth once daily  #90 x 3   Entered and Authorized by:   [name] MD   Signed by:   [name] MD on 05/09/2008   Method used:   Print then Give to Patient   RxID:   [id number]  ]

## 2010-12-04 NOTE — Progress Notes (Signed)
Summary: refill request for cozaar  Phone Note Refill Request Message from:  Fax from Pharmacy  Refills Requested: Medication #1:  COZAAR 100 MG TABS 1 by mouth once daily   Last Refilled: 10/12/2009 Faxed form from [facility] is on your shelf.  Initial call taken by: Lowella Petties CMA,  December 10, 2009 12:39 PM  Follow-up for Phone Call        form done and in nurse in box  Follow-up by: [name] MD,  December 10, 2009 1:43 PM  Additional Follow-up for Phone Call Additional follow up Details #1::        Completed form was  faxed to [phone] as instructed. Lewanda Rife LPN  December 10, 2009 4:07 PM     New/Updated Medications: MECLIZINE HCL 25 MG TABS (MECLIZINE HCL) one tab by mouth three times a day as needed for dizziness/vertigo COZAAR 100 MG TABS (LOSARTAN POTASSIUM) 1 by mouth once daily Prescriptions: MECLIZINE HCL 25 MG TABS (MECLIZINE HCL) one tab by mouth three times a day as needed for dizziness/vertigo  #60 x 3   Entered and Authorized by:   [name] MD   Signed by:   [name] MD on 12/10/2009   Method used:   Historical   RxID:   [id number] COZAAR 100 MG TABS (LOSARTAN POTASSIUM) 1 by mouth once daily  #90 x 3   Entered and Authorized by:   [name] MD   Signed by:   [name] MD on 12/10/2009   Method used:   Historical   RxID:   [id number]

## 2010-12-04 NOTE — Progress Notes (Signed)
Summary: Rx-Paroxetine,Toprol, Nabumetone  Phone Note Refill Request Message from:  Fax from Pharmacy on May 01, 2008 2:38 PM  Refills Requested: Medication #1:  PAROXETINE HCL 30 MG  TABS 1 by mouth qd #90. [facility] fax# [phone] ph# [phone]  Initial call taken by: Silas [place],  May 01, 2008 2:39 PM  Follow-up for Phone Call        pt needs to be seen before next refill by Dr Milinda Antis, preferably within the next month. She has not been seen by Dr Milinda Antis for quite some time. Follow-up by: Shaune Leeks MD,  May 01, 2008 3:12 PM  Additional Follow-up for Phone Call Additional follow up Details #1::        Faxed all 3 medications over to pharmacy. Toprol, Paroxetine and Nabumetone. Additional Follow-up by: Silas [place],  May 01, 2008 3:24 PM      Prescriptions: PAROXETINE HCL 30 MG  TABS (PAROXETINE HCL) 1 by mouth qd  #90 x 0   Entered and Authorized by:   Shaune Leeks MD   Signed by:   Shaune Leeks MD on 05/01/2008   Method used:   Print then Give to Patient   RxID:   [id number] NABUMETONE 500 MG  TABS (NABUMETONE) 1 by mouth two times a day with food prn  #60 x 0   Entered and Authorized by:   Shaune Leeks MD   Signed by:   Shaune Leeks MD on 05/01/2008   Method used:   Print then Give to Patient   RxID:   [id number] TOPROL XL 50 MG  TB24 (METOPROLOL SUCCINATE) 1 by mouth qd  #90 x 0   Entered and Authorized by:   Shaune Leeks MD   Signed by:   Shaune Leeks MD on 05/01/2008   Method used:   Print then Give to Patient   RxID:   [id number]

## 2010-12-04 NOTE — Assessment & Plan Note (Signed)
Summary: 6 monthf ollow up/rbh   Vital Signs:  Patient profile:   77 year old female Weight:      162.75 pounds BMI:     28.93 Temp:     98.0 degrees F oral Pulse rate:   88 / minute Pulse rhythm:   regular BP sitting:   132 / 88  (left arm) Cuff size:   large  Vitals Entered By: Sydell Axon LPN (July 23, 2010 11:18 AM) CC: 6 Month follow-up   History of Present Illness: here for f/u of HTN an lipids and hyperglycemia  R eye is a little swollen and irritated  thinks she could possibly have sinus infection had a cold 2 weeks ago -- with thick green mucous  head has " not been right" since - with pain over eyes  blows nose in ams for 30 minutes- a little color to it  no fever no cough    went to the beach for a week    bp better 132/88  wt is down 4 lb-- is trying   lipids -- trig down at 246 and LDL up a bit to 113  AIC is 5.9 -- borderline too much iced tea with sugar in it  is ok with using artificial sweetner   diet --pretty good otherwise when not on vacation  is trying to avoid sweets   refuses Td even though she understands the risk    Allergies: 1)  ! Augmentin  Review of Systems General:  Complains of fatigue; denies chills, fever, loss of appetite, and malaise. Eyes:  Denies blurring and eye irritation. ENT:  Complains of nasal congestion, postnasal drainage, sinus pressure, and sore throat. CV:  Denies chest pain or discomfort, lightheadness, and palpitations. Resp:  Denies cough, shortness of breath, and wheezing. GI:  Denies abdominal pain, change in bowel habits, indigestion, and nausea. GU:  Denies urinary frequency. MS:  Complains of joint pain; denies joint redness, joint swelling, muscle aches, and cramps. Derm:  Denies itching, lesion(s), poor wound healing, and rash. Neuro:  Complains of headaches; denies numbness and tingling. Psych:  mood is fair . Endo:  Denies cold intolerance, excessive thirst, excessive urination, and heat  intolerance. Heme:  Denies abnormal bruising and bleeding.  Physical Exam  General:  overweight but generally well appearing  Head:  tender frontal and ethmoid sinuses bilat normocephalic, atraumatic, and no abnormalities observed.   Eyes:  some mild edema of lid of R eye - not red or tender vision grossly intact, pupils equal, and pupils reactive to light.   no conjunctival pallor, injection or icterus  Ears:  R ear normal and L ear normal.   Nose:  nares are injected and congested bilaterally  Mouth:  post nasal drip noted  pharynx pink and moist, no erythema, and no exudates.   Neck:  supple with full rom and no masses or thyromegally, no JVD or carotid bruit  Chest Wall:  No deformities, masses, or tenderness noted. Lungs:  Normal respiratory effort, chest expands symmetrically. Lungs are clear to auscultation, no crackles or wheezes. Heart:  Normal rate and regular rhythm. S1 and S2 normal without gallop, murmur, click, rub or other extra sounds. Abdomen:  Bowel sounds positive,abdomen soft and non-tender without masses, organomegaly or hernias noted. no renal bruits  Msk:  No deformity or scoliosis noted of thoracic or lumbar spine.  no acute joint changes  Pulses:  R and L carotid,radial,femoral,dorsalis pedis and posterior tibial pulses are full and equal bilaterally  Extremities:  No clubbing, cyanosis, edema, or deformity noted with normal full range of motion of all joints.   Neurologic:  strength normal in all extremities, sensation intact to light touch, gait normal, and DTRs symmetrical and normal.   Skin:  Intact without suspicious lesions or rashes Cervical Nodes:  No lymphadenopathy noted Inguinal Nodes:  No significant adenopathy Psych:  nl affect but seems a bit fatigued   Impression & Recommendations:  Problem # 1:  HYPERGLYCEMIA, FASTING (ICD-790.29) Assessment Unchanged this is mild but warrants following disc imp of wt loss disc healthy diet (low simple  sugar/ choose complex carbs/ low sat fat) diet and exercise in detail  will stop sugar in tea f/u 6 mo - will check labs then  Problem # 2:  HYPERTENSION (ICD-401.9) Assessment: Improved  this is improved with current meds enc to remain active and avoid salt in diet  f/u 6 mo  Her updated medication list for this problem includes:    Toprol Xl 50 Mg Tb24 (Metoprolol succinate) .Marland Kitchen... 1 by mouth once daily    Cozaar 100 Mg Tabs (Losartan potassium) .Marland Kitchen... 1 by mouth once daily  BP today: 132/88 Prior BP: 146/80 (01/08/2010)  Labs Reviewed: K+: 4.8 (07/08/2010) Creat: : 0.7 (07/08/2010)   Chol: 182 (07/08/2010)   HDL: 29.60 (07/08/2010)   LDL: DEL (12/01/2006)   TG: 246.0 (07/08/2010)  Problem # 3:  HYPERLIPIDEMIA (ICD-272.4) Assessment: Unchanged  this is overall the same with some imp in trig and some inc in LDL - rev labs in detail today again rev low sat fat diet to follow f/u 6 mo   Labs Reviewed: SGOT: 21 (07/08/2010)   SGPT: 16 (07/08/2010)   HDL:29.60 (07/08/2010), 34.20 (01/06/2010)  LDL:DEL (12/01/2006)  Chol:182 (07/08/2010), 160 (01/06/2010)  Trig:246.0 (07/08/2010), 252.0 (01/06/2010)  Problem # 4:  OSTEOARTHRITIS (ICD-715.90) Assessment: Unchanged  pt continues to see Dr Jon Billings for this and gout meds unchanged - continue to watch for complications from nsaid (disc risks) Her updated medication list for this problem includes:    Tylenol Extra Strength 500 Mg Tabs (Acetaminophen) .Marland Kitchen... As needed    Bayer Aspirin Ec Low Dose 81 Mg Tbec (Aspirin) .Marland Kitchen... 1 daily by mouth    Nabumetone 500 Mg Tabs (Nabumetone) .Marland Kitchen... Take one tablet by mouth two times daily with food as needed.  Orders: Prescription Created Electronically [phone])  Problem # 5:  SINUSITIS - ACUTE-NOS (ICD-461.9) Assessment: New new sinus infection -- will tx with amox  recommend sympt care- see pt instructions   pt advised to update me if symptoms worsen or do not improve  Her updated medication  list for this problem includes:    Flonase 50 Mcg/act Susp (Fluticasone propionate) .Marland Kitchen... 2 sprays in each nostril once daily as needed    Amoxicillin 500 Mg Caps (Amoxicillin) .Marland Kitchen... 1 by mouth three times a day for 10 days for a sinus infection  Complete Medication List: 1)  Paroxetine Hcl 30 Mg Tabs (Paroxetine hcl) .Marland Kitchen.. 1 by mouth once daily 2)  Toprol Xl 50 Mg Tb24 (Metoprolol succinate) .Marland Kitchen.. 1 by mouth once daily 3)  Tylenol Extra Strength 500 Mg Tabs (Acetaminophen) .... As needed 4)  Allopurinol 100 Mg Tabs (Allopurinol) .... 2 by mouth once daily 5)  Calcium 500/d 500-400 Mg-unit Chew (Calcium-vitamin d) .... Take 1 tablet by mouth two times a day 6)  Glucosamine-chondroitin 1500-1200 Mg/48ml Liqd (Glucosamine-chondroitin) .Marland Kitchen.. 1 daily by mouth 7)  Bayer Aspirin Ec Low Dose 81 Mg Tbec (Aspirin) .Marland KitchenMarland KitchenMarland Kitchen 1  daily by mouth 8)  Meclizine Hcl 25 Mg Tabs (Meclizine hcl) .... One tab by mouth three times a day as needed for dizziness/vertigo 9)  Cozaar 100 Mg Tabs (Losartan potassium) .Marland Kitchen.. 1 by mouth once daily 10)  Alprazolam 0.5 Mg Tabs (Alprazolam) .... 1/2 to 1 by mouth up to two times a day as needed severe anxiety 11)  Flonase 50 Mcg/act Susp (Fluticasone propionate) .... 2 sprays in each nostril once daily as needed 12)  Anusol-hc 25 Mg Supp (Hydrocortisone acetate) .Marland Kitchen.. 1 per rectum at bedtime for maximum 10 days for hemorroids as needed 13)  Nabumetone 500 Mg Tabs (Nabumetone) .... Take one tablet by mouth two times daily with food as needed. 14)  Multivitamins Tabs (Multiple vitamin) .... Take one daily 15)  Alfalfa Complex Otc  .... Otc as directed. 16)  Amoxicillin 500 Mg Caps (Amoxicillin) .Marland Kitchen.. 1 by mouth three times a day for 10 days for a sinus infection  Other Orders: Flu Vaccine 40yrs + MEDICARE PATIENTS (Z6109) Administration Flu vaccine - MCR (U0454)  Patient Instructions: 1)  please take the amoxicillin as directed for sinus infection  2)  you can try mucinex over the  counter twice daily as directed and nasal saline spray for congestion 3)  tylenol over the counter as directed may help with aches, headache and fever 4)  call if symptoms worsen or if not improved in 4-5 days  5)  no other medicine changes 6)  keep working on low fat and low sugar diet  7)  uses splenda or stuvia in your tea instead of sugar and avoid sweets  8)  flu shot today  Prescriptions: AMOXICILLIN 500 MG CAPS (AMOXICILLIN) 1 by mouth three times a day for 10 days for a sinus infection  #30 x 0   Entered and Authorized by:   [name] MD   Signed by:   [name] MD on 07/23/2010   Method used:   Electronically to        [facility]* (retail)       [address]       [place], [place]  [address]       Ph: [phone]       Fax: [phone]   RxID:   [id number]   Current Allergies (reviewed today): ! AUGMENTIN   Preventive Care Screening  Contraindications of Treatment or Deferment of Test/Procedure:    Test/Procedure: TD vaccine    Reason for deferment: declined   Flu Vaccine Consent Questions     Do you have a history of severe allergic reactions to this vaccine? no    Any prior history of allergic reactions to egg and/or gelatin? no    Do you have a sensitivity to the preservative Thimersol? no    Do you have a past history of Guillan-Barre Syndrome? no    Do you currently have an acute febrile illness? no    Have you ever had a severe reaction to latex? no    Vaccine information given and explained to patient? yes    Are you currently pregnant? no    Lot Number:[id number]   Exp Date:05/02/2011   Site Given  Left Deltoid IMdflu

## 2010-12-04 NOTE — Letter (Signed)
Summary: [facility]  [facility]   Imported By: Lanelle Bal 01/10/2010 13:45:38  _____________________________________________________________________  External Attachment:    Type:   Image     Comment:   External Document

## 2010-12-04 NOTE — Progress Notes (Signed)
Summary: meclizine rx  Phone Note Refill Request Message from:  [employer] on February 04, 2009 8:54 AM  Refills Requested: Medication #1:  MECLIZINE HCL 25 MG TABS one tab by mouth three times a day as needed for dizziness/vertigo   [facility] Ref[facility]: [facility] [address]  Initial call taken by: Liane Comber,  February 04, 2009 8:54 AM  Follow-up for Phone Call        px written on EMR for call in  Follow-up by: [name] MD,  February 04, 2009 11:49 AM  Additional Follow-up for Phone Call Additional follow up Details #1::        Rx done, was sent electronically. ....................................................Marland KitchenNatasha Chavers February 04, 2009 12:05 PM       Prescriptions: MECLIZINE HCL 25 MG TABS (MECLIZINE HCL) one tab by mouth three times a day as needed for dizziness/vertigo  #30 x 2   Entered and Authorized by:   [name] MD   Signed by:   Liane Comber on 02/04/2009   Method used:   Telephoned to ...       [facility]  [address] [phone][facility] (retail)       [address]       [place], [place]  [id number]       Ph: [phone] or [phone]       Fax: [phone]   RxID:   [id number]

## 2010-12-04 NOTE — Progress Notes (Signed)
Summary: daughter requests phone call  Phone Note Call from Patient Call back at [phone] or if Early AM [phone]   Caller: Daughter- Gracelyn Nurse Call For: [name] MD Summary of Call: Pt is at [facility] for rehab after being in the hospital for back pain.  Daughter is asking that you call her so that she can fill you in on some things, she says pt's condition is going backwards rather than forwards and she doesnt know what to do next. Initial call taken by: Lowella Petties CMA, AAMA,  November 10, 2010 11:52 AM  Follow-up for Phone Call        please let her know I am seeing pts - and will try to call her at end of the day or in am  ask if she has a particular physician in rehab caring for her- thanks Follow-up by: [name] MD,  November 10, 2010 1:49 PM  Additional Follow-up for Phone Call Additional follow up Details #1::        Gracelyn Nurse notified as instructed. Pt is seeing Dr Jarold Motto at the rehab facility but Larita Fife said he does not know her hx and Larita Fife would like to talk with Dr Milinda Antis.Lewanda Rife LPN  November 10, 2010 2:58 PM     Additional Follow-up for Phone Call Additional follow up Details #2::    I spoke to [place]- sounds like despite pain med and muscle relaxers she is still in great deal of pain making PT nearly impossible she has another MRI tomorrw (? spine and hip-- was not sure) I enc her to go through with that to keep looking for source of the pain -- only then can they know better how to treat it  I also enc her to have a good sit down visit with Dr Jarold Motto after the MRI to make a plan Follow-up by: [name] MD,  November 10, 2010 5:40 PM

## 2010-12-04 NOTE — Medication Information (Signed)
Summary: Cozaar & Meclizine/[facility]  Cozaar & Meclizine/[facility]   Imported By: Lanelle Bal 12/12/2009 14:55:49  _____________________________________________________________________  External Attachment:    Type:   Image     Comment:   External Document

## 2010-12-04 NOTE — Consult Note (Signed)
Summary: [facility]Dr. Corliss Skains  [facility]/Dr. Corliss Skains   Imported By: Eleonore Chiquito 09/25/2008 15:11:35  _____________________________________________________________________  External Attachment:    Type:   Image     Comment:   External Document

## 2010-12-04 NOTE — Progress Notes (Signed)
Summary: ? Omitting Hyzaar and Paroxetine  Phone Note Call from Patient Call back at Home Phone [phone]   Caller: Patient Call For: Dr. Milinda Antis Summary of Call: Has gout in her feet.  Saw Dr. Corliss Skains yesterday who did lab work and she received those results today, saying it was fine.  Patient says Dr. Corliss Skains wants her to ask you about omitting two of her medications:  Hyzaar and Paroxetine.  Dr. Corliss Skains feels that these may be contributing to her gout episodes. Are you in agreement with doing that or does patient need an OV with you to discuss? Initial call taken by: Delilah Shan,  May 03, 2008 4:30 PM  Follow-up for Phone Call        pleaselsend for labs and note from Dr Jon Billings please pre load chart when able - if not already done sched f/u with me to discuss  Follow-up by: [name] MD,  May 05, 2008 11:59 AM  Additional Follow-up for Phone Call Additional follow up Details #1::        I reviewed note- she can hold the hyzaar and paroxetine for now (stop them , but keep the bottles) f/u with me in about a month- will check bp and see how she is doing off meds  Additional Follow-up by: [name] MD,  May 09, 2008 10:39 AM    New/Updated Medications: HYZAAR 100-25 MG  TABS (LOSARTAN POTASSIUM-HCTZ) 1 by mouth once daily (holding for gout 7/09) PAROXETINE HCL 30 MG  TABS (PAROXETINE HCL) 1 by mouth once daily (holding for gout )

## 2010-12-04 NOTE — Medication Information (Signed)
Summary: Paroxetine Toprol & Allopurinol/[facility]  Paroxetine Toprol & Allopurinol/[facility]   Imported By: Lanelle Bal 12/20/2009 12:44:09  _____________________________________________________________________  External Attachment:    Type:   Image     Comment:   External Document

## 2010-12-04 NOTE — Letter (Signed)
Summary: [facility]  [facility]   Imported By: Lanelle Bal 09/24/2010 10:23:47  _____________________________________________________________________  External Attachment:    Type:   Image     Comment:   External Document

## 2010-12-04 NOTE — Letter (Signed)
Summary: [facility]  [facility]   Imported By: Lanelle Bal 11/18/2009 11:07:36  _____________________________________________________________________  External Attachment:    Type:   Image     Comment:   External Document

## 2010-12-04 NOTE — Progress Notes (Signed)
Summary: toporol, paroxetine, nabumetone refills  Phone Note Refill Request Message from:  Fax from Pharmacy on May 17, 2008 9:58 AM  Refills Requested: Medication #1:  TOPROL XL 50 MG  TB24 1 by mouth qd  Medication #2:  PAROXETINE HCL 30 MG  TABS 1 by mouth once daily  Medication #3:  NABUMETONE 500 MG  TABS one by mouth two times a day as needed  Method Requested: Fax to [facility] Initial call taken by: Liane Comber,  May 17, 2008 10:01 AM  Follow-up for Phone Call        printed and in my out box for pick up  Follow-up by: [name] MD,  May 17, 2008 1:45 PM  Additional Follow-up for Phone Call Additional follow up Details #1::        Rx called to pharmacy Additional Follow-up by: Liane Comber,  May 17, 2008 3:10 PM    New/Updated Medications: TOPROL XL 50 MG  TB24 (METOPROLOL SUCCINATE) 1 by mouth once daily NABUMETONE 500 MG  TABS (NABUMETONE) one by mouth two times a day as needed with food   Prescriptions: NABUMETONE 500 MG  TABS (NABUMETONE) one by mouth two times a day as needed with food  #180 x 3   Entered by:   [name] MD   Authorized by:   Liane Comber   Signed by:   [name] MD on 05/17/2008   Method used:   Print then Give to Patient   RxID:   [id number] TOPROL XL 50 MG  TB24 (METOPROLOL SUCCINATE) 1 by mouth once daily  #90 x 3   Entered by:   [name] MD   Authorized by:   Liane Comber   Signed by:   [name] MD on 05/17/2008   Method used:   Print then Give to Patient   RxID:   [id number] PAROXETINE HCL 30 MG  TABS (PAROXETINE HCL) 1 by mouth once daily  #90 x 3   Entered by:   [name] MD   Authorized by:   Liane Comber   Signed by:   [name] MD on 05/17/2008   Method used:   Print then Give to Patient   RxID:   [id number]

## 2010-12-04 NOTE — Assessment & Plan Note (Signed)
Summary: BP CHECK / LFW   Vital Signs:  Patient profile:   77 year old female Height:      63 inches Weight:      162 pounds Temp:     97.9 degrees F oral Pulse rate:   88 / minute Pulse rhythm:   regular BP sitting:   142 / 90  (left arm) Cuff size:   regular  Vitals Entered By: Liane Comber (February 05, 2009 9:18 AM)  Serial Vital Signs/Assessments:  Time      Position  BP       Pulse  Resp  Temp     By                     140/85                         [name] MD   History of Present Illness: dizziness is better - but still easy to bring on if she moves it too quickly ? if worsened by the pollen   wt is down one pound   bp is improved on cozaar  no troubles with that at all  also stopped her nambumetone   Last Lipid ProfileCholesterol: 195 (12/01/2006 9:30:00 AM)HDL:  30.7 (12/01/2006 9:30:00 AM)LDL:  LDL 109.9(12/01/2006 9:30:00 AM)Triglycerides:  Last Liver profileSGOT:  24 (12/31/2008 4:07:00 PM)SPGT:  16 (12/31/2008 4:07:00 PM)T. Bili:  1.0 (12/31/2008 4:07:00 PM)Alk Phos:  68 (12/31/2008 4:07:00 PM)   diet is overall good       Allergies: 1)  ! Augmentin  Past History:  Past Medical History:    Allergic rhinitis    Anxiety    Colon cancer, hx of    Depression    GERD    Gout    Hyperlipidemia    Hypertension    Osteoarthritis    vertigo     (12/31/2008)  Past Surgical History:    Colon resection    Appendectomy    Cataract extraction    Tonsillectomy    Hosp- withdrawel off lorazepam    Dexa- osteopenia (2002)    LVH, hyperdynamic LVF 86%- persentine cardiolite (01/1997)    Colonoscopy- neg (10/2002)    LS- facet point deg change (10/205)    Abd [place]- neg (10/2005)    Old compression fracture- spine    Fall- fractured wrist, concussion (11/2006)     (05/09/2008)  Family History:    Father: deceased - prostate cancer age 71    Mother: MI, ? gout     (12/31/2008)  Social History:    Marital Status:widowed    Children: 1  daughter    Occupation: retired from [employer] [place] [employer] [place]    non smoker    non drinker     (12/31/2008)  Review of Systems General:  Denies fatigue, fever, loss of appetite, and malaise. Eyes:  Denies blurring and eye pain. ENT:  Complains of nasal congestion and postnasal drainage. CV:  Denies chest pain or discomfort and palpitations. Resp:  Denies cough and wheezing. GI:  Denies abdominal pain and change in bowel habits. Derm:  Denies itching, lesion(s), and rash. Neuro:  Denies headaches, numbness, tingling, tremors, visual disturbances, and weakness. Psych:  anx is better . Endo:  Denies excessive thirst and excessive urination.  Physical Exam  General:  Well-developed,well-nourished,in no acute distress; alert,appropriate and cooperative throughout examination Head:  normocephalic, atraumatic, and no abnormalities observed.  no sinus or TA tenderness Eyes:  vision grossly intact, pupils equal, pupils round, and pupils reactive to light.  no conjunctival pallor, injection or icterus  no nystagmus today Ears:  R ear normal and L ear normal.   Mouth:  MMM Neck:  supple with full rom and no masses or thyromegally, no JVD or carotid bruit  Chest Wall:  No deformities, masses, or tenderness noted. Lungs:  Normal respiratory effort, chest expands symmetrically. Lungs are clear to auscultation, no crackles or wheezes. Heart:  Normal rate and regular rhythm. S1 and S2 normal without gallop, murmur, click, rub or other extra sounds. Msk:  No deformity or scoliosis noted of thoracic or lumbar spine.   notalbe yellow gouty tophi on feet- tender  Pulses:  R and L carotid,radial,femoral,dorsalis pedis and posterior tibial pulses are full and equal bilaterally Extremities:  No clubbing, cyanosis, edema, or deformity noted with normal full range of motion of all joints.   Neurologic:  sensation intact to light touch, gait normal, and DTRs symmetrical and normal.   Skin:  Intact without  suspicious lesions or rashes- fair complexion without pallor Cervical Nodes:  No lymphadenopathy noted Psych:  normal affect, talkative and pleasant    Impression & Recommendations:  Problem # 1:  HYPERTENSION (ICD-401.9) Assessment Improved  improved with cozaar- labs today f/u 3 mo - may need to inc dose if not at goal  Her updated medication list for this problem includes:    Toprol Xl 50 Mg Tb24 (Metoprolol succinate) .Marland Kitchen... 1 by mouth once daily    Cozaar 50 Mg Tabs (Losartan potassium) .Marland Kitchen... 1 by mouth once daily  Orders: Venipuncture (60454) TLB-Lipid Panel (80061-LIPID) TLB-Renal Function Panel (80069-RENAL) TLB-Hepatic/Liver Function Pnl (80076-HEPATIC) TLB-TSH (Thyroid Stimulating Hormone) (84443-TSH) TLB-CBC Platelet - w/Differential (85025-CBCD)  BP today: 142/90 Prior BP: 178/90 (12/31/2008)  Labs Reviewed: K+: 4.2 (12/31/2008) Creat: : 0.9 (12/31/2008)   Chol: 195 (12/01/2006)   HDL: 30.7 (12/01/2006)   LDL: DEL (12/01/2006)   TG: 265 (12/01/2006)  Problem # 2:  HYPERLIPIDEMIA (ICD-272.4) Assessment: Unchanged  labs today with good diet- will adv with results  Orders: Venipuncture (09811) TLB-Lipid Panel (80061-LIPID) TLB-Renal Function Panel (80069-RENAL) TLB-Hepatic/Liver Function Pnl (80076-HEPATIC) TLB-TSH (Thyroid Stimulating Hormone) (84443-TSH) TLB-CBC Platelet - w/Differential (85025-CBCD)  Labs Reviewed: SGOT: 24 (12/31/2008)   SGPT: 16 (12/31/2008)   HDL:30.7 (12/01/2006)  LDL:DEL (12/01/2006)  Chol:195 (12/01/2006)  Trig:265 (12/01/2006)  Problem # 3:  VERTIGO (ICD-780.4) Assessment: Improved much improved- keep meclizine on hand for as needed update if worse or re-currence Her updated medication list for this problem includes:    Meclizine Hcl 25 Mg Tabs (Meclizine hcl) ..... One tab by mouth three times a day as needed for dizziness/vertigo  Problem # 4:  ALLERGIC RHINITIS (ICD-477.9) Assessment: Deteriorated trial of flonase and  update  Her updated medication list for this problem includes:    Flonase 50 Mcg/act Susp (Fluticasone propionate) .Marland Kitchen... 2 sprays in each nostril once daily  Complete Medication List: 1)  Paroxetine Hcl 30 Mg Tabs (Paroxetine hcl) .Marland Kitchen.. 1 by mouth once daily 2)  Toprol Xl 50 Mg Tb24 (Metoprolol succinate) .Marland Kitchen.. 1 by mouth once daily 3)  Tylenol Extra Strength 500 Mg Tabs (Acetaminophen) .... As needed 4)  Allopurinol 100 Mg Tabs (Allopurinol) .... 2 by mouth once daily 5)  Calcium 500/d 500-400 Mg-unit Chew (Calcium-vitamin d) .... Take 1 tablet by mouth two times a day 6)  Glucosamine-chondroitin 1500-1200 Mg/92ml Liqd (Glucosamine-chondroitin) .Marland Kitchen.. 1 daily by mouth 7)  Bayer Aspirin Ec Low Dose 81 Mg  Tbec (Aspirin) .Marland Kitchen.. 1 daily by mouth 8)  Meclizine Hcl 25 Mg Tabs (Meclizine hcl) .... One tab by mouth three times a day as needed for dizziness/vertigo 9)  Cozaar 50 Mg Tabs (Losartan potassium) .Marland Kitchen.. 1 by mouth once daily 10)  Alprazolam 0.5 Mg Tabs (Alprazolam) .... 1/2 to 1 by mouth up to two times a day as needed severe anxiety 11)  Flonase 50 Mcg/act Susp (Fluticasone propionate) .... 2 sprays in each nostril once daily 12)  Anusol-hc 25 Mg Supp (Hydrocortisone acetate) .Marland Kitchen.. 1 per rectum at bedtime for maximum 10 days for hemorroids  Patient Instructions: 1)  no change in medicines  2)  stay off the nambumetone -- this can raise blood pressure  3)  blood pressure is better today-140/85 (not to goal - but improving ) 4)  labs today for kidney and liver and cholesterol  5)  follow up in 3 months  6)  try flonase for allergies/ congestion- let me know if it does not help  7)  you can still use the nasal saline  8)  please update me if vertigo does not improve further or if it worsens  9)  I am giving you px to mail to pharmacy Prescriptions: ANUSOL-HC 25 MG SUPP (HYDROCORTISONE ACETATE) 1 per rectum at bedtime for maximum 10 days for hemorroids  #10 x 0   Entered and Authorized by:    [name] MD   Signed by:   [name] MD on 02/05/2009   Method used:   Print then Give to Patient   RxID:   [id number] COZAAR 50 MG TABS (LOSARTAN POTASSIUM) 1 by mouth once daily  #90 x 3   Entered and Authorized by:   [name] MD   Signed by:   [name] MD on 02/05/2009   Method used:   Print then Give to Patient   RxID:   [id number] ALLOPURINOL 100 MG  TABS (ALLOPURINOL) 2 by mouth once daily  #180 x 3   Entered and Authorized by:   [name] MD   Signed by:   [name] MD on 02/05/2009   Method used:   Print then Give to Patient   RxID:   [id number] TOPROL XL 50 MG  TB24 (METOPROLOL SUCCINATE) 1 by mouth once daily  #90 x 3   Entered and Authorized by:   [name] MD   Signed by:   [name] MD on 02/05/2009   Method used:   Print then Give to Patient   RxID:   [id number] PAROXETINE HCL 30 MG  TABS (PAROXETINE HCL) 1 by mouth once daily  #90 x 3   Entered and Authorized by:   [name] MD   Signed by:   [name] MD on 02/05/2009   Method used:   Print then Give to Patient   RxID:   [id number] FLONASE 50 MCG/ACT SUSP (FLUTICASONE PROPIONATE) 2 sprays in each nostril once daily  #1 mdi x 11   Entered and Authorized by:   [name] MD   Signed by:   [name] MD on 02/05/2009   Method used:   Print then Give to Patient   RxID:   [id number]

## 2010-12-04 NOTE — Progress Notes (Signed)
Summary: daughter requests another phone call  Phone Note Call from Patient   Caller: Daughter Gracelyn Nurse [phone] Summary of Call: Daughter is asking that you call her again.  She still has concerns about the pt being in rehab.  She thinks pt is getting depressed.            Lowella Petties CMA, AAMA  November 17, 2010 4:40 PM   Follow-up for Phone Call        I spoke to her again Ms Ricciardi is still in tremendous pain and MRI showed no source this continues to frustrate the pt / family and physician difficult to do PT because of pain worried pt will become depressed which I think is a reasonable concern I urged her to set up a meeting with her rehab doctor - Dr Jarold Motto to disc poss depression/ pain control and rehab plan in more detail from the sounds of it - not nearly ready or strong enough to come home yet  Follow-up by: [name] MD,  November 18, 2010 4:47 PM

## 2010-12-04 NOTE — Assessment & Plan Note (Signed)
Summary: 3 mo. f/u/bir   Vital Signs:  Patient profile:   77 year old female Height:      63 inches Weight:      163 pounds Temp:     97.3 degrees F oral Pulse rate:   80 / minute Pulse rhythm:   regular BP sitting:   150 / 90  (left arm) Cuff size:   regular  Vitals Entered By: Liane Comber (May 07, 2009 8:51 AM)  History of Present Illness: here for f/u on HTN and lipids  on cozaar - may need to inc dose bp today still high no headaches or flushing or symptoms of high blood pressure  is trying to watch salt in diet   chol last check in april too high trig 252, HDL 27 and LDL 92 diet has not been much better  does know what to watch in diet -- does eat chicken and fish  this is difficult for her  not many fried foods , no butter  some eggs too much cheese    sugar was 106- no hx of DM  had a shot in knee last week -- from Dr Jon Billings -- and her knee feels much better  is not going to have more surgery at her age   Allergies: 1)  ! Augmentin  Past History:  Past Medical History: Last updated: January 15, 2009 Allergic rhinitis Anxiety Colon cancer, hx of Depression GERD Gout Hyperlipidemia Hypertension Osteoarthritis vertigo  Past Surgical History: Last updated: 05/09/2008 Colon resection Appendectomy Cataract extraction Tonsillectomy Hosp- withdrawel off lorazepam Dexa- osteopenia (2002) LVH, hyperdynamic LVF 86%- persentine cardiolite (01/1997) Colonoscopy- neg (10/2002) LS- facet point deg change (10/205) Abd [place]- neg (10/2005) Old compression fracture- spine Fall- fractured wrist, concussion (11/2006)  Family History: Last updated: 2009-01-15 Father: deceased - prostate cancer age 2 Mother: MI, ? gout  Social History: Last updated: 01-15-09 Marital Status:widowed Children: 1 daughter Occupation: retired from [employer] [place] non smoker non drinker  Review of Systems General:  Denies fatigue, fever, loss of appetite, and  malaise. Eyes:  Denies blurring and eye pain. CV:  Denies chest pain or discomfort, palpitations, shortness of breath with exertion, and swelling of feet. Resp:  Denies cough and wheezing. GI:  Denies abdominal pain, change in bowel habits, indigestion, loss of appetite, and nausea. MS:  Complains of joint pain and stiffness; denies joint redness. Derm:  Denies lesion(s), poor wound healing, and rash. Neuro:  Denies numbness and tingling. Psych:  mood is ok . Endo:  Denies excessive thirst and excessive urination. Heme:  Denies abnormal bruising and bleeding.  Physical Exam  General:  overweight but generally well appearing  Head:  normocephalic, atraumatic, and no abnormalities observed.   Eyes:  vision grossly intact, pupils equal, pupils round, and pupils reactive to light.  no conjunctival pallor, injection or icterus  Mouth:  pharynx pink and moist.   Neck:  supple with full rom and no masses or thyromegally, no JVD or carotid bruit  Lungs:  Normal respiratory effort, chest expands symmetrically. Lungs are clear to auscultation, no crackles or wheezes. Heart:  Normal rate and regular rhythm. S1 and S2 normal without gallop, murmur, click, rub or other extra sounds. Msk:  poor rom knees- with slow intentional gait  no kyphosis Pulses:  R and L carotid,radial,femoral,dorsalis pedis and posterior tibial pulses are full and equal bilaterally Extremities:  No clubbing, cyanosis, edema, or deformity noted with normal full range of motion of all joints.   Neurologic:  sensation intact to light touch, gait normal, and DTRs symmetrical and normal.   Skin:  Intact without suspicious lesions or rashes fair complexion without pallor  Cervical Nodes:  No lymphadenopathy noted Psych:  normal affect, talkative and pleasant    Impression & Recommendations:  Problem # 1:  HYPERTENSION (ICD-401.9) Assessment Deteriorated  control is not optimal inc cozaar to 100- update if problems  labs-  renal in 2 mo and follow up  disc low sodium/ fat diet and exercise  Her updated medication list for this problem includes:    Toprol Xl 50 Mg Tb24 (Metoprolol succinate) .Marland Kitchen... 1 by mouth once daily    Cozaar 100 Mg Tabs (Losartan potassium) .Marland Kitchen... 1 by mouth once daily  BP today: 150/90 Prior BP: 142/90 (02/05/2009)  Labs Reviewed: K+: 4.5 (02/05/2009) Creat: : 0.7 (02/05/2009)   Chol: 158 (02/05/2009)   HDL: 27.70 (02/05/2009)   LDL: DEL (12/01/2006)   TG: 252.0 (02/05/2009)  Problem # 2:  HYPERLIPIDEMIA (ICD-272.4) Assessment: Deteriorated  lipids not optimal- with inc trig last check disc low sat fat/ (and sugar ) diet  will re check in 2 mo and disc at f/u   Labs Reviewed: SGOT: 19 (02/05/2009)   SGPT: 15 (02/05/2009)   HDL:27.70 (02/05/2009), 30.7 (12/01/2006)  LDL:DEL (12/01/2006)  Chol:158 (02/05/2009), 195 (12/01/2006)  Trig:252.0 (02/05/2009), 265 (12/01/2006)  Problem # 3:  VERTIGO (ICD-780.4) Assessment: Comment Only infrequent- given meclizine px to have on hand  Her updated medication list for this problem includes:    Meclizine Hcl 25 Mg Tabs (Meclizine hcl) ..... One tab by mouth three times a day as needed for dizziness/vertigo  Problem # 4:  ANXIETY (ICD-300.00) Assessment: Unchanged occasionally needs alprazolam- takes with caution- refil today Her updated medication list for this problem includes:    Paroxetine Hcl 30 Mg Tabs (Paroxetine hcl) .Marland Kitchen... 1 by mouth once daily    Alprazolam 0.5 Mg Tabs (Alprazolam) .Marland Kitchen... 1/2 to 1 by mouth up to two times a day as needed severe anxiety  Complete Medication List: 1)  Paroxetine Hcl 30 Mg Tabs (Paroxetine hcl) .Marland Kitchen.. 1 by mouth once daily 2)  Toprol Xl 50 Mg Tb24 (Metoprolol succinate) .Marland Kitchen.. 1 by mouth once daily 3)  Tylenol Extra Strength 500 Mg Tabs (Acetaminophen) .... As needed 4)  Allopurinol 100 Mg Tabs (Allopurinol) .... 2 by mouth once daily 5)  Calcium 500/d 500-400 Mg-unit Chew (Calcium-vitamin d) .... Take 1  tablet by mouth two times a day 6)  Glucosamine-chondroitin 1500-1200 Mg/2ml Liqd (Glucosamine-chondroitin) .Marland Kitchen.. 1 daily by mouth 7)  Bayer Aspirin Ec Low Dose 81 Mg Tbec (Aspirin) .Marland Kitchen.. 1 daily by mouth 8)  Meclizine Hcl 25 Mg Tabs (Meclizine hcl) .... One tab by mouth three times a day as needed for dizziness/vertigo 9)  Cozaar 100 Mg Tabs (Losartan potassium) .Marland Kitchen.. 1 by mouth once daily 10)  Alprazolam 0.5 Mg Tabs (Alprazolam) .... 1/2 to 1 by mouth up to two times a day as needed severe anxiety 11)  Flonase 50 Mcg/act Susp (Fluticasone propionate) .... 2 sprays in each nostril once daily 12)  Anusol-hc 25 Mg Supp (Hydrocortisone acetate) .Marland Kitchen.. 1 per rectum at bedtime for maximum 10 days for hemorroids  Patient Instructions: 1)  increase cozaar to 100 mg daily for blood pressure  2)  if any side effects or problems- let me know  3)  schedule fasting labs lipid/renal/ast/alt 272, 401.1 in 2 months and then follow up  Prescriptions: MECLIZINE HCL 25 MG TABS (MECLIZINE HCL) one  tab by mouth three times a day as needed for dizziness/vertigo  #30 x 1   Entered and Authorized by:   [name] MD   Signed by:   [name] MD on 05/07/2009   Method used:   Print then Give to Patient   RxID:   [id number] ALPRAZOLAM 0.5 MG TABS (ALPRAZOLAM) 1/2 to 1 by mouth up to two times a day as needed severe anxiety  #30 x 1   Entered and Authorized by:   [name] MD   Signed by:   [name] MD on 05/07/2009   Method used:   Print then Give to Patient   RxID:   [id number] COZAAR 100 MG TABS (LOSARTAN POTASSIUM) 1 by mouth once daily  #90 x 3   Entered and Authorized by:   [name] MD   Signed by:   [name] MD on 05/07/2009   Method used:   Print then Give to Patient   RxID:   [id number]   Prior Medications (reviewed today): PAROXETINE HCL 30 MG  TABS (PAROXETINE HCL) 1 by mouth once daily TOPROL XL 50 MG  TB24 (METOPROLOL SUCCINATE) 1 by mouth once  daily TYLENOL EXTRA STRENGTH 500 MG  TABS (ACETAMINOPHEN) as needed ALLOPURINOL 100 MG  TABS (ALLOPURINOL) 2 by mouth once daily CALCIUM 500/D 500-400 MG-UNIT  CHEW (CALCIUM-VITAMIN D) Take 1 tablet by mouth two times a day GLUCOSAMINE-CHONDROITIN 1500-1200 MG/30ML  LIQD (GLUCOSAMINE-CHONDROITIN) 1 DAILY BY MOUTH BAYER ASPIRIN EC LOW DOSE 81 MG  TBEC (ASPIRIN) 1 DAILY BY MOUTH MECLIZINE HCL 25 MG TABS (MECLIZINE HCL) one tab by mouth three times a day as needed for dizziness/vertigo FLONASE 50 MCG/ACT SUSP (FLUTICASONE PROPIONATE) 2 sprays in each nostril once daily ANUSOL-HC 25 MG SUPP (HYDROCORTISONE ACETATE) 1 per rectum at bedtime for maximum 10 days for hemorroids Current Allergies (reviewed today): ! AUGMENTIN Current Medications (including changes made in today's visit):  PAROXETINE HCL 30 MG  TABS (PAROXETINE HCL) 1 by mouth once daily TOPROL XL 50 MG  TB24 (METOPROLOL SUCCINATE) 1 by mouth once daily TYLENOL EXTRA STRENGTH 500 MG  TABS (ACETAMINOPHEN) as needed ALLOPURINOL 100 MG  TABS (ALLOPURINOL) 2 by mouth once daily CALCIUM 500/D 500-400 MG-UNIT  CHEW (CALCIUM-VITAMIN D) Take 1 tablet by mouth two times a day GLUCOSAMINE-CHONDROITIN 1500-1200 MG/30ML  LIQD (GLUCOSAMINE-CHONDROITIN) 1 DAILY BY MOUTH BAYER ASPIRIN EC LOW DOSE 81 MG  TBEC (ASPIRIN) 1 DAILY BY MOUTH MECLIZINE HCL 25 MG TABS (MECLIZINE HCL) one tab by mouth three times a day as needed for dizziness/vertigo COZAAR 100 MG TABS (LOSARTAN POTASSIUM) 1 by mouth once daily ALPRAZOLAM 0.5 MG TABS (ALPRAZOLAM) 1/2 to 1 by mouth up to two times a day as needed severe anxiety FLONASE 50 MCG/ACT SUSP (FLUTICASONE PROPIONATE) 2 sprays in each nostril once daily ANUSOL-HC 25 MG SUPP (HYDROCORTISONE ACETATE) 1 per rectum at bedtime for maximum 10 days for hemorroids

## 2010-12-04 NOTE — Progress Notes (Signed)
Summary: regarding labs  Phone Note Call from Patient Call back at Home Phone [phone]   Caller: Patient Call For: [name] MD Summary of Call: Pt has made an appt for labs on monday and a follow up visit with you on wednesday.  She has an order from Dr Jon Billings for cbc, cmet and some other labs that she cant read.  I advised her that we need an order from you for her labs, we cant just draw labs for another doctor from another facility.  Please advise on what to order. Initial call taken by: Lowella Petties CMA,  January 03, 2010 3:53 PM  Follow-up for Phone Call        I am checking lipids / liver tests and renal panel for her for legal reasons - I am no longer allowed to do labs for other doctors  will give her a copy of what I do -- which may be part of what she needs  Follow-up by: [name] MD,  January 03, 2010 5:25 PM  Additional Follow-up for Phone Call Additional follow up Details #1::        Patient notified as instructed by telephone. Lewanda Rife LPN  January 04, 4695 5:42 PM

## 2010-12-04 NOTE — Progress Notes (Signed)
Summary: BP readings  Phone Note Call from Patient Call back at Home Phone [phone]   Caller: Patient Call For: dr tower Summary of Call: Pt has appt with you on thursday, she said she would rather not come in and possibly be exposed to the flu if she doesnt have to.  She said it is a follow up with her blood pressure and she can give you the readings---- 7/13   128/78, 7/22  120/75, 8/12  122/80,  8/28  124/76,  9/05  122/76,  9/20  140/80.   Do you still want to see her? Initial call taken by: Lowella Petties,  August 07, 2008 1:03 PM  Follow-up for Phone Call        those are ok-- thanks for the update do not need to come in  keep a check on blood pressure and update me if it increases  f/u with me in late spring  Follow-up by: [name] MD,  August 07, 2008 1:11 PM  Additional Follow-up for Phone Call Additional follow up Details #1::        Advised patient.  ......................................................Marland KitchenLiane Comber August 07, 2008 4:49 PM

## 2010-12-05 ENCOUNTER — Ambulatory Visit: Payer: Self-pay | Admitting: Family Medicine

## 2010-12-06 ENCOUNTER — Encounter: Payer: Self-pay | Admitting: Family Medicine

## 2010-12-10 NOTE — Progress Notes (Signed)
Summary: confused  Phone Note Call from Patient Call back at Home Phone [phone]   Caller: Gracelyn Nurse  Call For: [name] MD Summary of Call: Patient is taking flexeril, hyrocodone, oxycodone, and oxycontin for her pain. Daughter says that since starting the oxycodone and oxycontin she has been terribly confused and last night she was up and down all night. Daughter is asking if it could be from the medication. Her procedure for her back is tomorrow and duaghter wants her to feel up to having this done. She is asking for any suggestions.  Initial call taken by: Melody Comas,  December 04, 2010 8:18 AM  Follow-up for Phone Call        that sounds like too much narcotic call the px doctor --- ? rehab Dr Jarold Motto or her back specialist to disc which to stop and which to keep her on  Follow-up by: [name] MD,  December 04, 2010 9:10 AM  Additional Follow-up for Phone Call Additional follow up Details #1::        Larita Fife notified as instructed by telephone. Lewanda Rife LPN  December 04, 2010 12:24 PM

## 2010-12-12 ENCOUNTER — Ambulatory Visit (INDEPENDENT_AMBULATORY_CARE_PROVIDER_SITE_OTHER): Payer: MEDICARE

## 2010-12-12 ENCOUNTER — Telehealth: Payer: Self-pay | Admitting: Family Medicine

## 2010-12-12 ENCOUNTER — Encounter: Payer: Self-pay | Admitting: Family Medicine

## 2010-12-12 DIAGNOSIS — N390 Urinary tract infection, site not specified: Secondary | ICD-10-CM

## 2010-12-12 LAB — CONVERTED CEMR LAB
Bilirubin Urine: NEGATIVE
Casts: 0 /lpf
Glucose, Urine, Semiquant: NEGATIVE
Ketones, urine, test strip: NEGATIVE
Nitrite: NEGATIVE
Specific Gravity, Urine: <1.005
Urine crystals, microscopic: 0 /hpf
Urobilinogen, UA: 0.2
WBC Urine, dipstick: NEGATIVE
Yeast, UA: 0
pH: 8.5

## 2010-12-13 ENCOUNTER — Encounter: Payer: Self-pay | Admitting: Family Medicine

## 2010-12-16 ENCOUNTER — Encounter: Payer: Self-pay | Admitting: Family Medicine

## 2010-12-18 NOTE — Miscellaneous (Signed)
Summary: ?uti urine for dip and spin  Medications Added CIPRO 250 MG TABS (CIPROFLOXACIN HCL) one tablet by mouth two times a day for 5 days       Clinical Lists Changes  Medications: Added new medication of CIPRO 250 MG TABS (CIPROFLOXACIN HCL) one tablet by mouth two times a day for 5 days - Signed Rx of CIPRO 250 MG TABS (CIPROFLOXACIN HCL) one tablet by mouth two times a day for 5 days;  #10 x 0;  Signed;  Entered by: Lewanda Rife LPN;  Authorized by: [name] MD;  Method used: Electronically to [facility] [address] [place], [place], [place]  [address], Ph: [phone], Fax: [phone] Orders: Added new Service order of UA Dipstick W/ Micro (manual) (13086) - Signed Added new Test order of T-Culture, Urine ([id number]) - Signed Added new Service order of Specimen Handling (99000) - Signed Observations: Added new observation of UR COMMENTS: 0  (12/12/2010 15:09) Added new observation of UA YEAST: 0  (12/12/2010 15:09) Added new observation of CASTS URINE: 0 /lpf (12/12/2010 15:09) Added new observation of URINE CRYSTA: 0 /hpf (12/12/2010 15:09) Added new observation of EPI CELL UR: 0-1 /lpf (12/12/2010 15:09) Added new observation of MUCUS URINE: few  (12/12/2010 15:09) Added new observation of BACTERIA URN: few  (12/12/2010 15:09) Added new observation of RBCS MICRO U: 0-1  (12/12/2010 15:09) Added new observation of WBCS MICRO U: 1-2 cells/hpf (12/12/2010 15:09) Added new observation of COMMENTS: frequency of urine, and burning when urinates. Pt recently had surgery and cannot come to office.Larita Fife can be reached at [phone] or cell [phone] and wants  med called to [facility].Please advise. Lewanda Rife LPN  December 12, 2010 3:11 PM   (12/12/2010 15:09) Added new observation of PH URINE: 8.5  (12/12/2010 15:09) Added new observation of SPEC GR URIN: <1.005  (12/12/2010 15:09) Added new observation of APPEARANCE U: slightly hazy  (12/12/2010  15:09) Added new observation of UA COLOR: yellow  (12/12/2010 15:09) Added new observation of WBC DIPSTK U: negative  (12/12/2010 15:09) Added new observation of NITRITE URN: negative  (12/12/2010 15:09) Added new observation of UROBILINOGEN: 0.2  (12/12/2010 15:09) Added new observation of PROTEIN, URN: trace  (12/12/2010 15:09) Added new observation of BLOOD UR DIP: trace-lysed  (12/12/2010 15:09) Added new observation of KETONES URN: negative  (12/12/2010 15:09) Added new observation of BILIRUBIN UR: negative  (12/12/2010 15:09) Added new observation of GLUCOSE, URN: negative  (12/12/2010 15:09)    Prescriptions: CIPRO 250 MG TABS (CIPROFLOXACIN HCL) one tablet by mouth two times a day for 5 days  #10 x 0   Entered by:   Lewanda Rife LPN   Authorized by:   [name] MD   Signed by:   Lewanda Rife LPN on 53/66/4403   Method used:   Electronically to        [facility] (retail)       [address]       [place], [place]  [address]       Ph: [phone]       Fax: [phone]   RxID:   [id number]   Laboratory Results   Urine Tests  Date/Time Received: December 12, 2010 3:09 PM  Date/Time Reported: December 12, 2010 3:09 PM   Routine Urinalysis   Color: yellow Appearance: slightly hazy Glucose: negative   (Normal Range: Negative) Bilirubin: negative   (Normal Range: Negative) Ketone:  negative   (Normal Range: Negative) Spec. Gravity: <1.005   (Normal Range: 1.003-1.035) Blood: trace-lysed   (Normal Range: Negative) pH: 8.5   (Normal Range: 5.0-8.0) Protein: trace   (Normal Range: Negative) Urobilinogen: 0.2   (Normal Range: 0-1) Nitrite: negative   (Normal Range: Negative) Leukocyte Esterace: negative   (Normal Range: Negative)  Urine Microscopic WBC/HPF: 1-2 RBC/HPF: 0-1 Bacteria/HPF: few Mucous/HPF: few Epithelial/HPF: 0-1 Crystals/HPF: 0 Casts/LPF: 0 Yeast/HPF: 0 Other: 0    Comments: frequency of urine, and burning  when urinates. Pt recently had surgery and cannot come to office.Larita Fife can be reached at [phone] or cell [phone] and wants  med called to [facility].Please advise. Lewanda Rife LPN  December 12, 2010 3:11 PM    very slight if any sign of infection please call in cipro 250 mg 1 by mouth two times a day for 5 days #10 no ref please cx urine  update if no imp continue drinking lots of water call or seek care is symptoms don't improve in 2-3 days or if you develop back pain, nausea, or vomiting  Current Allergies: ! AUGMENTIN Med sent electonically to [facility]. Urine sent for c/s as instructed.Larita Fife notified as instructed by telephone. Lewanda Rife LPN  December 12, 2010 5:25 PM

## 2010-12-18 NOTE — Miscellaneous (Signed)
[facility]   Imported By: Kassie Mends 12/10/2010 10:09:50  _____________________________________________________________________  External Attachment:    Type:   Image     Comment:   External Document

## 2010-12-18 NOTE — Progress Notes (Signed)
Summary: uti  Phone Note Call from Patient Call back at Home Phone [phone]   Caller: Patient Call For: [name] MD Summary of Call: Daughter says that patient has uti, she has frequent urination, burning. She is asking if she could have something called in because patient just had back surgery and is unable to get out for an appt. Uses [facility].  Initial call taken by: Melody Comas,  December 12, 2010 9:23 AM  Follow-up for Phone Call        need urine specimen beforehand-- either bring in pre boiled / sterilized jar or pick up a plastic cup  can do this because pt is bedbound  if they have a home nurse right now they could also probably do that  I want to get her started on abx if needed before weekend Follow-up by: [name] MD,  December 12, 2010 10:23 AM  Additional Follow-up for Phone Call Additional follow up Details #1::        Tried [phone] staying consistently busy and this is the only contact #. Will try later.Lewanda Rife LPN  December 12, 2010 12:39 PM   Larita Fife notified as instructed by telephone. Larita Fife is going to try to get a urine specimen here this afternoon. Home health is not coming out now. Siince Larita Fife is not sure if can get here today or if she can she is not sure what time. Nurse visit will need to be added when and if Larita Fife can bring specimen. Larita Fife can also be reached at cell [phone] and would like med called to [facility] because [facility] closes at 5pm.[facility] LPN  December 12, 2010 12:54 PM     Additional Follow-up for Phone Call Additional follow up Details #2::    Larita Fife brought in urine specimen. Please see nurse visit.Lewanda Rife LPN  December 12, 2010 3:13 PM

## 2010-12-30 NOTE — Letter (Signed)
Summary: [facility]   Imported By: Kassie Mends 12/23/2010 09:59:50  _____________________________________________________________________  External Attachment:    Type:   Image     Comment:   External Document

## 2011-01-12 LAB — DIFFERENTIAL
Basophils Absolute: 0 10*3/uL (ref 0.0–0.1)
Basophils Absolute: 0.1 10*3/uL (ref 0.0–0.1)
Basophils Relative: 0 % (ref 0–1)
Basophils Relative: 1 % (ref 0–1)
Eosinophils Absolute: 0 10*3/uL (ref 0.0–0.7)
Eosinophils Absolute: 0.3 10*3/uL (ref 0.0–0.7)
Eosinophils Relative: 0 % (ref 0–5)
Eosinophils Relative: 4 % (ref 0–5)
Lymphocytes Relative: 17 % (ref 12–46)
Lymphocytes Relative: 7 % — ABNORMAL LOW (ref 12–46)
Lymphs Abs: 0.7 10*3/uL (ref 0.7–4.0)
Lymphs Abs: 1.4 10*3/uL (ref 0.7–4.0)
Monocytes Absolute: 0.8 10*3/uL (ref 0.1–1.0)
Monocytes Absolute: 0.9 10*3/uL (ref 0.1–1.0)
Monocytes Relative: 11 % (ref 3–12)
Monocytes Relative: 9 % (ref 3–12)
Neutro Abs: 5.5 10*3/uL (ref 1.7–7.7)
Neutro Abs: 8.1 10*3/uL — ABNORMAL HIGH (ref 1.7–7.7)
Neutrophils Relative %: 67 % (ref 43–77)
Neutrophils Relative %: 84 % — ABNORMAL HIGH (ref 43–77)

## 2011-01-12 LAB — CBC
HCT: 41.2 % (ref 36.0–46.0)
HCT: 41.9 % (ref 36.0–46.0)
Hemoglobin: 14.2 g/dL (ref 12.0–15.0)
Hemoglobin: 14.5 g/dL (ref 12.0–15.0)
MCH: 32.9 pg (ref 26.0–34.0)
MCH: 33.7 pg (ref 26.0–34.0)
MCHC: 34.5 g/dL (ref 30.0–36.0)
MCHC: 34.6 g/dL (ref 30.0–36.0)
MCV: 95.6 fL (ref 78.0–100.0)
MCV: 97.4 fL (ref 78.0–100.0)
Platelets: 199 10*3/uL (ref 150–400)
Platelets: 207 10*3/uL (ref 150–400)
RBC: 4.3 MIL/uL (ref 3.87–5.11)
RBC: 4.31 MIL/uL (ref 3.87–5.11)
RDW: 12.2 % (ref 11.5–15.5)
RDW: 12.8 % (ref 11.5–15.5)
WBC: 8.1 10*3/uL (ref 4.0–10.5)
WBC: 9.6 10*3/uL (ref 4.0–10.5)

## 2011-01-12 LAB — CEA: CEA: <0.5 ng/mL (ref 0.0–5.0)

## 2011-01-12 LAB — COMPREHENSIVE METABOLIC PANEL
ALT: 15 U/L (ref 0–35)
AST: 27 U/L (ref 0–37)
Albumin: 3.5 g/dL (ref 3.5–5.2)
Alkaline Phosphatase: 61 U/L (ref 39–117)
BUN: 21 mg/dL (ref 6–23)
CO2: 26 mEq/L (ref 19–32)
Calcium: 9.3 mg/dL (ref 8.4–10.5)
Chloride: 102 mEq/L (ref 96–112)
Creatinine, Ser: 0.71 mg/dL (ref 0.4–1.2)
GFR calc Af Amer: >60 mL/min (ref >60)
GFR calc non Af Amer: >60 mL/min (ref >60)
Glucose, Bld: 115 mg/dL — ABNORMAL HIGH (ref 70–99)
Potassium: 3.7 mEq/L (ref 3.5–5.1)
Sodium: 136 mEq/L (ref 135–145)
Total Bilirubin: 0.6 mg/dL (ref 0.3–1.2)
Total Protein: 6.3 g/dL (ref 6.0–8.3)

## 2011-01-12 LAB — VITAMIN B12: Vitamin B-12: 638 pg/mL (ref 211–911)

## 2011-01-12 LAB — BASIC METABOLIC PANEL
BUN: 23 mg/dL (ref 6–23)
CO2: 25 mEq/L (ref 19–32)
Calcium: 9.8 mg/dL (ref 8.4–10.5)
Chloride: 98 mEq/L (ref 96–112)
Creatinine, Ser: 0.94 mg/dL (ref 0.4–1.2)
GFR calc Af Amer: >60 mL/min (ref >60)
GFR calc non Af Amer: 57 mL/min — ABNORMAL LOW (ref >60)
Glucose, Bld: 97 mg/dL (ref 70–99)
Potassium: 3.9 mEq/L (ref 3.5–5.1)
Sodium: 137 mEq/L (ref 135–145)

## 2011-01-12 LAB — PROTIME-INR
INR: 1.05 (ref 0.00–1.49)
INR: 1.1 (ref 0.00–1.49)
Prothrombin Time: 13.9 seconds (ref 11.6–15.2)
Prothrombin Time: 14.4 seconds (ref 11.6–15.2)

## 2011-01-12 LAB — APTT
aPTT: 28 seconds (ref 24–37)
aPTT: 30 seconds (ref 24–37)

## 2011-01-12 LAB — VITAMIN D 25 HYDROXY (VIT D DEFICIENCY, FRACTURES): Vit D, 25-Hydroxy: 82 ng/mL (ref 30–89)

## 2011-01-12 LAB — TSH: TSH: 0.968 u[IU]/mL (ref 0.350–4.500)

## 2011-01-20 ENCOUNTER — Encounter: Payer: Self-pay | Admitting: Family Medicine

## 2011-01-20 ENCOUNTER — Ambulatory Visit (INDEPENDENT_AMBULATORY_CARE_PROVIDER_SITE_OTHER): Payer: MEDICARE | Admitting: Family Medicine

## 2011-01-20 DIAGNOSIS — F411 Generalized anxiety disorder: Secondary | ICD-10-CM

## 2011-01-20 DIAGNOSIS — M51379 Other intervertebral disc degeneration, lumbosacral region without mention of lumbar back pain or lower extremity pain: Secondary | ICD-10-CM

## 2011-01-20 DIAGNOSIS — M5137 Other intervertebral disc degeneration, lumbosacral region: Secondary | ICD-10-CM

## 2011-01-20 DIAGNOSIS — M5136 Other intervertebral disc degeneration, lumbar region: Secondary | ICD-10-CM

## 2011-01-20 DIAGNOSIS — I10 Essential (primary) hypertension: Secondary | ICD-10-CM

## 2011-01-20 NOTE — Progress Notes (Signed)
Subjective:    Patient ID: Michelle Gregory, female    DOB: Mar 16, 1923, 77 y.o.   MRN: [id number]  HPI Is doing ok overall  Still in a lot of pain right now with her back  Has been in the hosp and then rehab for severe back pain- very rough time - was at [facility] for a while Rev records rec with pt and family member as well as phone notes in detail  Cannot sit for long periods of time  Has had 3 pain blocks -- slowly working but a long ways to go  Next option is doing surgery... Unsure about that -- disk is pressing on the nerve (would try to clean that out)-- very routine  Is on pain medication Is on flexeril -- but makes her drowsy and a tad confused ---does not think it is very helpful and too much drug with the narcotic pain meds she is on  Tries to stay as active as poss but cannot do much  Some anx lately- takes occ xanax at night -careful not to mix with pain med- needs a refil   bp is overall fairly controlled at this time with metoprolol  116/76 today No orthostatic changes  Will need refils with mail order - awaiting the faxes now   Past Medical History  Diagnosis Date  . Allergic rhinitis   . Anxiety   . History of colon cancer   . Depression   . GERD (gastroesophageal reflux disease)   . Hyperlipidemia   . Hypertension   . Osteoarthritis   . Vertigo   . Spinal compression fracture     History   Social History  . Marital Status: Widowed    Spouse Name: N/A    Number of Children: 1  . Years of Education: N/A   Occupational History  . retired    Social History Main Topics  . Smoking status: Never Smoker   . Smokeless tobacco: Not on file  . Alcohol Use: No  . Drug Use: Not on file  . Sexually Active: Not on file   Other Topics Concern  . Not on file   Social History Narrative  . No narrative on file    Past Surgical History  Procedure Date  . Colon resection   . Appendectomy   . Cataract extraction   . Tonsillectomy     Family History  Problem  Relation Age of Onset  . Prostate cancer Father   . Heart attack Mother   . Gout Mother         Review of Systems  Constitutional: Positive for activity change and fatigue. Negative for fever and chills.  HENT: Negative for neck pain.   Eyes: Negative for pain and visual disturbance.  Respiratory: Negative for cough, chest tightness and shortness of breath.   Cardiovascular: Negative for chest pain and leg swelling.  Gastrointestinal: Negative for nausea and abdominal pain.  Genitourinary: Negative for urgency and frequency.  Musculoskeletal: Positive for arthralgias and gait problem. Negative for joint swelling.  Skin: Negative for rash.  Neurological: Positive for weakness. Negative for dizziness, syncope and headaches.       Weakness is generalized from deconditioning   Hematological: Does not bruise/bleed easily.  Psychiatric/Behavioral: Positive for sleep disturbance. Negative for suicidal ideas. The patient is nervous/anxious.        Objective:   Physical Exam  Constitutional: She appears well-nourished. She appears distressed.       Slow moving elderly female  HENT:  Head: Normocephalic and atraumatic.  Eyes: Conjunctivae are normal. Pupils are equal, round, and reactive to light.  Neck: Neck supple. No JVD present. No thyromegaly present.  Cardiovascular: Normal rate and regular rhythm.        Re check HR is 89 and regular  Pulmonary/Chest: Effort normal and breath sounds normal.  Abdominal: Soft. Bowel sounds are normal. She exhibits no distension.  Musculoskeletal: She exhibits tenderness. She exhibits no edema.       LS is diffusely tender L3-L5 and generally poor rom   Lymphadenopathy:    She has no cervical adenopathy.  Neurological: She is alert. She has normal reflexes. She displays normal reflexes. Coordination normal.       No assymetry in sensation or strength in LEs  Skin: Skin is warm and dry. No rash noted.  Psychiatric: She has a normal mood and  affect.          Assessment & Plan:

## 2011-01-20 NOTE — Assessment & Plan Note (Signed)
Refilled xanax for occas use at night- to use with caution in combo with strong pain meds Has family help and walker for ambulation

## 2011-01-20 NOTE — Patient Instructions (Signed)
Please stop the flexeril since it is not working and it causes sedation Continue follow up with Dr Shon Baton and Dr Ethelene Hal  Blood pressure is stable I will wait on faxes from your px company for refills

## 2011-01-20 NOTE — Assessment & Plan Note (Signed)
HTN continues to be well controlled Will watch for low bp with narcotic meds at this time No orthostatic symptoms Rev last labs and no changes

## 2011-01-21 ENCOUNTER — Telehealth: Payer: Self-pay | Admitting: *Deleted

## 2011-01-21 MED ORDER — ALLOPURINOL 100 MG PO TABS: 100.0000 mg | ORAL_TABLET | Freq: Two times a day (BID) | ORAL | Status: DC

## 2011-01-21 MED ORDER — MECLIZINE HCL 25 MG PO TABS: 25.0000 mg | ORAL_TABLET | Freq: Three times a day (TID) | ORAL | Status: DC | PRN

## 2011-01-21 MED ORDER — ALPRAZOLAM 0.5 MG PO TABS: 0.5000 mg | ORAL_TABLET | Freq: Every evening | ORAL | Status: DC | PRN

## 2011-01-21 MED ORDER — LOSARTAN POTASSIUM 100 MG PO TABS: 100.0000 mg | ORAL_TABLET | Freq: Every day | ORAL | Status: DC

## 2011-01-21 MED ORDER — PAROXETINE HCL 30 MG PO TABS: 30.0000 mg | ORAL_TABLET | ORAL | Status: DC

## 2011-01-21 NOTE — Telephone Encounter (Signed)
Faxed forms from [employer] are on your shelf.  They are asking for refills on several meds and a new script for xanax.  Pt's daughter called today and said that if we received the fax that pt wants to change to a less expensive toprol, perhaps plain.

## 2011-01-21 NOTE — Telephone Encounter (Signed)
Cannot do alprazolam mail in or 90 day since it is controlled and I think I gave her written px yesterday For toprol--have them find out EXACTLY what is covered for her Will fill out form for other refils and note on med list

## 2011-01-22 ENCOUNTER — Encounter: Payer: Self-pay | Admitting: Family Medicine

## 2011-01-22 NOTE — Telephone Encounter (Signed)
Patient notified as instructed by telephone. Spoke with Larita Fife pt's daughter also and she will contact insurance co re; med replacement for toprol and will contact us with info later. Completed rxs faxed to [phone].

## 2011-01-28 ENCOUNTER — Telehealth: Payer: Self-pay | Admitting: *Deleted

## 2011-01-28 DIAGNOSIS — I10 Essential (primary) hypertension: Secondary | ICD-10-CM

## 2011-01-28 MED ORDER — METOPROLOL TARTRATE 25 MG PO TABS: 25.0000 mg | ORAL_TABLET | Freq: Two times a day (BID) | ORAL | Status: DC

## 2011-01-28 NOTE — Telephone Encounter (Signed)
I think this is from [employer]- I no longer have form because it had other meds on it too ---- see last phone note Px printed for 3 mo supply Let me know if any trouble with it

## 2011-01-28 NOTE — Telephone Encounter (Signed)
Larita Fife notified as instructed by telephone. Larita Fife gave me [employer] [facility] fax # [phone]. Rx was faxed to [phone] as instructed.

## 2011-01-28 NOTE — Telephone Encounter (Signed)
Pt's daughter called to let you know that pt's insurance will cover metoprolol tartrate 50 mg's.  That is what pt wants sent to pharmacy.  You should still Have form.

## 2011-02-17 LAB — CBC
HCT: 42.3 % (ref 36.0–46.0)
Hemoglobin: 14.8 g/dL (ref 12.0–15.0)
MCHC: 34.9 g/dL (ref 30.0–36.0)
MCV: 99.9 fL (ref 78.0–100.0)
Platelets: 171 10*3/uL (ref 150–400)
RBC: 4.24 MIL/uL (ref 3.87–5.11)
RDW: 12.8 % (ref 11.5–15.5)
WBC: 8 10*3/uL (ref 4.0–10.5)

## 2011-02-17 LAB — DIFFERENTIAL
Basophils Absolute: 0 10*3/uL (ref 0.0–0.1)
Basophils Relative: 0 % (ref 0–1)
Eosinophils Absolute: 0.2 10*3/uL (ref 0.0–0.7)
Eosinophils Relative: 3 % (ref 0–5)
Lymphocytes Relative: 8 % — ABNORMAL LOW (ref 12–46)
Lymphs Abs: 0.7 10*3/uL (ref 0.7–4.0)
Monocytes Absolute: 0.4 10*3/uL (ref 0.1–1.0)
Monocytes Relative: 5 % (ref 3–12)
Neutro Abs: 6.8 10*3/uL (ref 1.7–7.7)
Neutrophils Relative %: 84 % — ABNORMAL HIGH (ref 43–77)

## 2011-02-17 LAB — BASIC METABOLIC PANEL
BUN: 19 mg/dL (ref 6–23)
CO2: 27 mEq/L (ref 19–32)
Calcium: 9.5 mg/dL (ref 8.4–10.5)
Chloride: 104 mEq/L (ref 96–112)
Creatinine, Ser: 0.74 mg/dL (ref 0.4–1.2)
GFR calc Af Amer: >60 mL/min (ref >60)
GFR calc non Af Amer: >60 mL/min (ref >60)
Glucose, Bld: 140 mg/dL — ABNORMAL HIGH (ref 70–99)
Potassium: 3.7 mEq/L (ref 3.5–5.1)
Sodium: 138 mEq/L (ref 135–145)

## 2011-02-17 LAB — URINALYSIS, ROUTINE W REFLEX MICROSCOPIC
Bilirubin Urine: NEGATIVE
Glucose, UA: NEGATIVE mg/dL
Hgb urine dipstick: NEGATIVE
Ketones, ur: NEGATIVE mg/dL
Nitrite: NEGATIVE
Protein, ur: NEGATIVE mg/dL
Specific Gravity, Urine: 1.021 (ref 1.005–1.030)
Urobilinogen, UA: 0.2 mg/dL (ref 0.0–1.0)
pH: 7 (ref 5.0–8.0)

## 2011-02-17 LAB — POCT CARDIAC MARKERS
CKMB, poc: <1 ng/mL — ABNORMAL LOW (ref 1.0–8.0)
Myoglobin, poc: 60 ng/mL (ref 12–200)
Troponin i, poc: <0.05 ng/mL (ref 0.00–0.09)

## 2011-02-19 ENCOUNTER — Telehealth: Payer: Self-pay | Admitting: *Deleted

## 2011-02-19 MED ORDER — ONDANSETRON HCL 4 MG PO TABS: 4.0000 mg | ORAL_TABLET | Freq: Three times a day (TID) | ORAL | Status: DC | PRN

## 2011-02-19 NOTE — Telephone Encounter (Signed)
Spoke with patient's daughter and notified her. She says that her mom has been drinking more today and is actually feeling some better. The nausea has passed, so she wants to hold off on the zofran for now. She hasn't had any fever at all. She will call back tomorrow if nausea returns for the zofran and possible appt. She is eating applesauce and some rice for a bland diet.

## 2011-02-19 NOTE — Telephone Encounter (Signed)
Patient is having diarrhea and feels nauseated which started day before yesterday and is worse today, no fever. Patient is taking pain medication and she wonders if that may be causing the symptoms. Patient's daughter states that she has been taking the Percocet sometimes on an empty stomach because she is not eating much.  Pharmacy [facility], [place]. Patient's daughter is aware that Dr. Milinda Antis is out of the office today, but will send the message to another MD.

## 2011-02-19 NOTE — Telephone Encounter (Signed)
Doubt coming from narcotic (usually that's more constipation) but abd pain and nausea could be if taking percocet on empty stomach.  Monitor for fever, ensure staying well hydrated if having diarrhea.  Can try zofran for nausea.  Update if not better, will need to be seen.

## 2011-03-20 NOTE — Op Note (Signed)
   NAME:  Michelle Gregory, Michelle Gregory                           ACCOUNT NO.:  [id number]   MEDICAL RECORD NO.:  [id number]                   PATIENT TYPE:  AMB   LOCATION:  ENDO                                 FACILITY:  [facility]   PHYSICIAN:  James L. Malon Kindle., M.D.          DATE OF BIRTH:  [birth year]   DATE OF PROCEDURE:  10/03/2002  DATE OF DISCHARGE:                                 OPERATIVE REPORT   PROCEDURE PERFORMED:  Colonoscopy.   ENDOSCOPIST:  Llana Aliment. Edwards, M.D.   MEDICATIONS:  Fentanyl 50 mcg, Versed 5 mg IV.   INDICATIONS FOR PROCEDURE:  The patient has had a previous history of colon  cancer, has had negative colonoscopy since then.  This is a 5-year follow-  up.   DESCRIPTION OF PROCEDURE:  The procedure had been explained to the patient  and consent obtained.  With the patient in the left lateral decubitus  position, a digital exam was performed.  The pediatric Olympus video  colonoscope was inserted and advanced under direct visualization.  The prep  was excellent.  We reached the cecum without difficulty.  The ileocecal  valve and appendiceal orifice were seen.  The scope was withdrawn and the  cecum, ascending colon, transverse, descending and sigmoid colon were seen  well.  No further polyps.  Rectum free of polyps.  The scope was withdrawn.  The patient tolerated the procedure well.   ASSESSMENT:  No evidence of further colon polyps or recurrent colon cancer  in this high risk woman with a previous history of colon cancer.   PLAN:  Will recommend repeating in five years.  Would recommend yearly  Hemoccults.                                               James L. Malon Kindle., M.D.    Waldron Session  D:  10/03/2002  T:  10/03/2002  Job:  [id number]   cc:   Marne A. Tower, M.D. [facility]  [address] [place]  [place] [address]  Fax: 1

## 2011-04-01 ENCOUNTER — Telehealth: Payer: Self-pay | Admitting: *Deleted

## 2011-04-01 NOTE — Telephone Encounter (Signed)
Patient daughter is asking if you would be willing to write rx for fosamax for patient because her osteoporosis is so bad and she is having so many back problems. She says that she discussed this with patient's orthopedic dr. And he said that he thinks it would be a good idea, but he would feel better if the rx came from you. Patient uses [facility] [address]

## 2011-04-01 NOTE — Telephone Encounter (Signed)
I need to talk to her about it first - in light of potential side effects and her hx -- there is a little controversy with this med in terms of jaw tumors/ pain/ reflux symptoms... And I need to make sure she is a safe candidate We can do this the next time she is here or she can schedule appt  There are other options as well but this may be a good one

## 2011-04-03 NOTE — Telephone Encounter (Signed)
Patient's daughter Larita Fife notified as instructed by telephone. Larita Fife said nursing home had sent fosamax home with pt and Larita Fife had given pt 1 fosamax a week for 2 weeks but Larita Fife will hold med until appt with Dr. Milinda Antis 04/15/11 at 2pm. Larita Fife wanted to discuss possible labs also so made a 30 min appt.

## 2011-04-07 ENCOUNTER — Telehealth: Payer: Self-pay | Admitting: Family Medicine

## 2011-04-07 ENCOUNTER — Ambulatory Visit (INDEPENDENT_AMBULATORY_CARE_PROVIDER_SITE_OTHER): Payer: MEDICARE | Admitting: Family Medicine

## 2011-04-07 ENCOUNTER — Encounter: Payer: Self-pay | Admitting: Family Medicine

## 2011-04-07 DIAGNOSIS — N390 Urinary tract infection, site not specified: Secondary | ICD-10-CM

## 2011-04-07 DIAGNOSIS — R350 Frequency of micturition: Secondary | ICD-10-CM

## 2011-04-07 LAB — POCT URINALYSIS DIPSTICK
Bilirubin, UA: UNDETERMINED
Blood, UA: NEGATIVE
Glucose, UA: NEGATIVE
Ketones, UA: UNDETERMINED
Nitrite, UA: POSITIVE
Protein, UA: UNDETERMINED
Spec Grav, UA: 1.01
Urobilinogen, UA: 1
pH, UA: 8

## 2011-04-07 MED ORDER — CIPROFLOXACIN HCL 500 MG PO TABS: 500.0000 mg | ORAL_TABLET | Freq: Two times a day (BID) | ORAL | Status: AC

## 2011-04-07 NOTE — Progress Notes (Signed)
  Subjective:    Patient ID: Michelle Gregory, female    DOB: 1923/01/07, 77 y.o.   MRN: [id number]  HPI CC: ? UTI  Yesterday started with urinary frequency, and urgency, some confusion yesterday (talking to ppl that weren't there, restless).  No dysuria.  Treated with azo yesterday.  Yesterday started wheezing, today better.  Wheezing worse when laying flat.  No abd pain, fevers/chills, nausea/vomiting.  No ST, HA, cough, at baseline congestion.  H/o some confusion from morphine, improved with readjustment.  On morphine for back pain.  H/o 4 compressed fractures and lumbar stenosis.  Today overall better, frequency improved.  Last UTI was 12/2010, no recent abx (since then).  Medications and allergies reviewed and updated in chart. PMHx reviewed for relevance.  Review of Systems Per HPI    Objective:   Physical Exam  Nursing note and vitals reviewed. Constitutional: She appears well-developed and well-nourished. No distress.  HENT:  Head: Normocephalic and atraumatic.  Mouth/Throat: Oropharynx is clear and moist. No oropharyngeal exudate.  Eyes: Conjunctivae and EOM are normal. Pupils are equal, round, and reactive to light. No scleral icterus.  Neck: Normal range of motion. Neck supple.  Cardiovascular: Regular rhythm, normal heart sounds and intact distal pulses.  Tachycardia present.   No murmur heard. Pulmonary/Chest: Effort normal and breath sounds normal. No respiratory distress. She has no wheezes. She has no rales.  Abdominal: Soft. Bowel sounds are normal. She exhibits no distension. There is no tenderness.       No CVA tenderness  Lymphadenopathy:    She has no cervical adenopathy.  Skin: Skin is warm and dry. No rash noted.  Psychiatric: She has a normal mood and affect.          Assessment & Plan:

## 2011-04-07 NOTE — Patient Instructions (Signed)
Looks like infection on urinalysis Treat with cipro 500mg  twice daily for 5 days. Update [place] if not improving as expected. Follow up with Dr. Milinda Antis in next few months. Remember to push fluids and plenty of rest, may use tylenol for discomfort, may use cranberry juice as well.

## 2011-04-07 NOTE — Telephone Encounter (Signed)
Error- note opened in error

## 2011-04-07 NOTE — Assessment & Plan Note (Signed)
Azo obfuscates UA.  Sent UCx. Treat with cipro for 5 days.  Update if not improving. rec push fluids, cranberry juice.

## 2011-04-09 LAB — URINE CULTURE
Colony Count: NO GROWTH
Organism ID, Bacteria: NO GROWTH

## 2011-04-15 ENCOUNTER — Ambulatory Visit: Payer: MEDICARE | Admitting: Family Medicine

## 2011-04-30 ENCOUNTER — Other Ambulatory Visit (INDEPENDENT_AMBULATORY_CARE_PROVIDER_SITE_OTHER): Payer: MEDICARE

## 2011-04-30 DIAGNOSIS — I10 Essential (primary) hypertension: Secondary | ICD-10-CM

## 2011-04-30 DIAGNOSIS — K219 Gastro-esophageal reflux disease without esophagitis: Secondary | ICD-10-CM

## 2011-04-30 DIAGNOSIS — E785 Hyperlipidemia, unspecified: Secondary | ICD-10-CM

## 2011-04-30 LAB — CBC WITH DIFFERENTIAL/PLATELET
Basophils Absolute: 0 10*3/uL (ref 0.0–0.1)
Basophils Relative: 0.6 % (ref 0.0–3.0)
Eosinophils Absolute: 0.4 10*3/uL (ref 0.0–0.7)
Eosinophils Relative: 4.7 % (ref 0.0–5.0)
HCT: 40.5 % (ref 36.0–46.0)
Hemoglobin: 13.8 g/dL (ref 12.0–15.0)
Lymphocytes Relative: 15.7 % (ref 12.0–46.0)
Lymphs Abs: 1.4 10*3/uL (ref 0.7–4.0)
MCHC: 34 g/dL (ref 30.0–36.0)
MCV: 99.7 fl (ref 78.0–100.0)
Monocytes Absolute: 0.6 10*3/uL (ref 0.1–1.0)
Monocytes Relative: 6.5 % (ref 3.0–12.0)
Neutro Abs: 6.2 10*3/uL (ref 1.4–7.7)
Neutrophils Relative %: 72.5 % (ref 43.0–77.0)
Platelets: 235 10*3/uL (ref 150.0–400.0)
RBC: 4.06 Mil/uL (ref 3.87–5.11)
RDW: 12.8 % (ref 11.5–14.6)
WBC: 8.6 10*3/uL (ref 4.5–10.5)

## 2011-04-30 LAB — LIPID PANEL
Cholesterol: 158 mg/dL (ref 0–200)
HDL: 32 mg/dL — ABNORMAL LOW (ref >39.00)
LDL Cholesterol: 102 mg/dL — ABNORMAL HIGH (ref 0–99)
Total CHOL/HDL Ratio: 5
Triglycerides: 122 mg/dL (ref 0.0–149.0)
VLDL: 24.4 mg/dL (ref 0.0–40.0)

## 2011-04-30 LAB — BASIC METABOLIC PANEL
BUN: 15 mg/dL (ref 6–23)
CO2: 32 mEq/L (ref 19–32)
Calcium: 9.2 mg/dL (ref 8.4–10.5)
Chloride: 99 mEq/L (ref 96–112)
Creatinine, Ser: 0.6 mg/dL (ref 0.4–1.2)
GFR: 104.72 mL/min (ref >60.00)
Glucose, Bld: 99 mg/dL (ref 70–99)
Potassium: 4 mEq/L (ref 3.5–5.1)
Sodium: 139 mEq/L (ref 135–145)

## 2011-04-30 LAB — HEPATIC FUNCTION PANEL
ALT: 12 U/L (ref 0–35)
AST: 18 U/L (ref 0–37)
Albumin: 3.7 g/dL (ref 3.5–5.2)
Alkaline Phosphatase: 70 U/L (ref 39–117)
Bilirubin, Direct: 0.2 mg/dL (ref 0.0–0.3)
Total Bilirubin: 0.7 mg/dL (ref 0.3–1.2)
Total Protein: 5.8 g/dL — ABNORMAL LOW (ref 6.0–8.3)

## 2011-05-05 ENCOUNTER — Ambulatory Visit (INDEPENDENT_AMBULATORY_CARE_PROVIDER_SITE_OTHER): Payer: MEDICARE | Admitting: Family Medicine

## 2011-05-05 ENCOUNTER — Encounter: Payer: Self-pay | Admitting: Family Medicine

## 2011-05-05 VITALS — BP 126/68 | HR 76 | Temp 98.6°F | Ht 63.0 in | Wt 138.5 lb

## 2011-05-05 DIAGNOSIS — F411 Generalized anxiety disorder: Secondary | ICD-10-CM

## 2011-05-05 DIAGNOSIS — M810 Age-related osteoporosis without current pathological fracture: Secondary | ICD-10-CM | POA: Insufficient documentation

## 2011-05-05 DIAGNOSIS — M899 Disorder of bone, unspecified: Secondary | ICD-10-CM

## 2011-05-05 DIAGNOSIS — R7309 Other abnormal glucose: Secondary | ICD-10-CM

## 2011-05-05 DIAGNOSIS — M5137 Other intervertebral disc degeneration, lumbosacral region: Secondary | ICD-10-CM

## 2011-05-05 DIAGNOSIS — E785 Hyperlipidemia, unspecified: Secondary | ICD-10-CM

## 2011-05-05 DIAGNOSIS — M8580 Other specified disorders of bone density and structure, unspecified site: Secondary | ICD-10-CM

## 2011-05-05 DIAGNOSIS — M5136 Other intervertebral disc degeneration, lumbar region: Secondary | ICD-10-CM

## 2011-05-05 DIAGNOSIS — M949 Disorder of cartilage, unspecified: Secondary | ICD-10-CM

## 2011-05-05 MED ORDER — ALPRAZOLAM 0.5 MG PO TABS: ORAL_TABLET | ORAL | Status: DC

## 2011-05-05 MED ORDER — RALOXIFENE HCL 60 MG PO TABS: 60.0000 mg | ORAL_TABLET | Freq: Every day | ORAL | Status: DC

## 2011-05-05 NOTE — Assessment & Plan Note (Signed)
Improving with lyrica and regular f/u Becoming more active

## 2011-05-05 NOTE — Assessment & Plan Note (Signed)
Last dexa 03 Suspect worse now in light of spinal comp fx Disc opt for tx Disc ca and D  Fosamax is not likely good option due to chronic trouble with gerd and regurgitaiton Did decide to try evista after disc poss side eff (would plan on 5 y trial) Will update  Plan dexa when up to it physically

## 2011-05-05 NOTE — Patient Instructions (Addendum)
Try to get 1200-1500 mg of calcium per day with at least 1000 iu of vitamin D - for bone health  Labs look stable Stay as active as you can  Try evista for bone health to prevent fractures- if side effects like hot flashes or leg cramps - let me know  When you are up to it -- let me know and we will schedule a bone density test  Follow up with me in about 6 months

## 2011-05-05 NOTE — Assessment & Plan Note (Signed)
Sugar is normal now Disc low glycemic diet for better health

## 2011-05-05 NOTE — Assessment & Plan Note (Signed)
Refilled xanax which works very well Uses sparingly and with supervision Disc fall risk

## 2011-05-05 NOTE — Assessment & Plan Note (Signed)
This is fairly controlled with diet Rev low sat fat diet  Needs inc in HDL - and inc activity will help this

## 2011-05-05 NOTE — Progress Notes (Signed)
Subjective:    Patient ID: Michelle Gregory, female    DOB: 02-05-1923, 77 y.o.   MRN: [id number]  HPI Here for fu of HTN and lipids and hyperglycemia   Has chronic back problems and pain  Nerve blocks did not make that much of a difference  lyrica has greatly helped  Is well worth it   Is starting to feel more human again   Mood- is much better as well  Was also able to cut the morphine back   Last labs overall look good  HDL low but LDL is ok at 102 Lab Results  Component Value Date   CHOL 158 04/30/2011   CHOL 182 07/08/2010   CHOL 160 01/06/2010   Lab Results  Component Value Date   HDL 32.00* 04/30/2011   HDL 29.60* 07/08/2010   HDL 56.43* 01/06/2010   Lab Results  Component Value Date   LDLCALC 102* 04/30/2011   Lab Results  Component Value Date   TRIG 122.0 04/30/2011   TRIG 246.0* 07/08/2010   TRIG 252.0* 01/06/2010   Lab Results  Component Value Date   CHOLHDL 5 04/30/2011   CHOLHDL 6 07/08/2010   CHOLHDL 5 01/06/2010   Lab Results  Component Value Date   LDLDIRECT 113.7 07/08/2010   LDLDIRECT 98.6 01/06/2010   LDLDIRECT 112.5 07/10/2009     HTN good bp control 126/68 No cp or ha or edema   Had a while when she did not want to eat at all - that is better now  protien was still low   Gout- no flares at all on the allopurinol Is working on better water intake    Glucose very good at 99 random  Nl blood count   Dr Ethelene Hal asked about fosamax  Found 4 compression fractures  dexa showed osteopenia in 03 Has acid reflux -in terms of trying fosamax No hx of blood clots   Patient Active Problem List  Diagnoses  . HYPERLIPIDEMIA  . GOUT  . ANXIETY  . DEPRESSION  . HYPERTENSION  . VARICOSE VEINS, LOWER EXTREMITIES  . HEMORRHOIDS, INTERNAL  . ALLERGIC RHINITIS  . REACTIVE AIRWAY DISEASE  . GERD  . IBS  . OSTEOARTHRITIS  . HYPERGLYCEMIA, FASTING  . COLON CANCER, HX OF  . UTI  . Degenerative lumbar disc  . Osteopenia   Past Medical History  Diagnosis Date    . Allergic rhinitis   . Anxiety   . History of colon cancer   . Depression   . GERD (gastroesophageal reflux disease)   . Hyperlipidemia   . Hypertension   . Osteoarthritis   . Vertigo   . Spinal compression fracture    Past Surgical History  Procedure Date  . Colon resection   . Appendectomy   . Cataract extraction   . Tonsillectomy    History  Substance Use Topics  . Smoking status: Never Smoker   . Smokeless tobacco: Not on file  . Alcohol Use: No   Family History  Problem Relation Age of Onset  . Prostate cancer Father   . Heart attack Mother   . Gout Mother    Allergies  Allergen Reactions  . PIR:JJOACZYSAYT+KZSWFUXNA+TFTDDUKGUR Acid+Aspartame     REACTION: diarrhea   Current Outpatient Prescriptions on File Prior to Visit  Medication Sig Dispense Refill  . allopurinol (ZYLOPRIM) 100 MG tablet Take 1 tablet (100 mg total) by mouth 2 (two) times daily.  180 tablet  3  . aspirin 81 MG tablet Take  81 mg by mouth daily.        . calcium-vitamin D (CALCIUM 500 +D) 500 MG tablet Take 1 tablet by mouth daily.       . Glucosamine-Chondroitin 1500-1200 MG/30ML LIQD Take by mouth. As dierected       . losartan (COZAAR) 100 MG tablet Take 1 tablet (100 mg total) by mouth daily.  90 tablet  3  . metoprolol tartrate (LOPRESSOR) 25 MG tablet Take 1 tablet (25 mg total) by mouth 2 (two) times daily.  180 tablet  3  . morphine (MSIR) 15 MG tablet Take 7.5 mg by mouth every 4 (four) hours as needed.       . multivitamin (THERAGRAN) per tablet Take 1 tablet by mouth daily.        Marland Kitchen PARoxetine (PAXIL) 30 MG tablet Take 1 tablet (30 mg total) by mouth every morning.  90 tablet  3  . pregabalin (LYRICA) 50 MG capsule 1 tablet by mouth in AM and 2 tabs by mouth at night      . acetaminophen (TYLENOL) 500 MG tablet Take 500 mg by mouth as needed.        . diclofenac (VOLTAREN) 0.1 % ophthalmic solution 1 drop. Apply to affected areas up to 4 times daily as needed for arthritis pain        . Elastic Bandages & Supports (FUTURO SHEER SUPPORT HOSE) MISC by Does not apply route. Support stockings to knee- 15-20 MM HG- wear once daily and as needed for leg edema and painful varicose veins       . fluticasone (FLONASE) 50 MCG/ACT nasal spray 2 sprays in each nostril once daily as needed      . hydrocortisone (ANUSOL-HC) 25 MG suppository Place 25 mg rectally. 1 per rectum at bedtime for maximum 10 days as needed       . meclizine (ANTIVERT) 25 MG tablet Take 1 tablet (25 mg total) by mouth 3 (three) times daily as needed.  60 tablet  3  . nabumetone (RELAFEN) 500 MG tablet Take 500 mg by mouth 2 (two) times daily.        . ondansetron (ZOFRAN) 4 MG tablet Take 1 tablet (4 mg total) by mouth every 8 (eight) hours as needed for nausea.  20 tablet  0  . oxyCODONE-acetaminophen (PERCOCET) 10-325 MG per tablet Take 1 tablet by mouth every 6 (six) hours as needed.              Review of Systems Review of Systems  Constitutional: Negative for fever, appetite change, fatigue and unexpected weight change.  Eyes: Negative for pain and visual disturbance.  Respiratory: Negative for cough and shortness of breath.   Cardiovascular: Negative.for cp or edema or sob   Gastrointestinal: Negative for nausea, diarrhea and constipation.  Genitourinary: Negative for urgency and frequency.  Skin: Negative for pallor. or rash MSK pos for chronic back pain  Neurological: Negative for weakness, light-headedness, numbness and headaches.  Hematological: Negative for adenopathy. Does not bruise/bleed easily.  Psychiatric/Behavioral: Negative for dysphoric mood. The patient is not nervous/anxious.          Objective:   Physical Exam  Constitutional: She appears well-developed and well-nourished. No distress.  HENT:  Head: Normocephalic and atraumatic.  Right Ear: External ear normal.  Left Ear: External ear normal.  Eyes: Conjunctivae and EOM are normal. Pupils are equal, round, and reactive to  light.  Neck: Normal range of motion. Neck supple. No JVD present. Carotid  bruit is not present. No thyromegaly present.  Cardiovascular: Normal rate, regular rhythm, normal heart sounds and intact distal pulses.   Pulmonary/Chest: Effort normal and breath sounds normal. No respiratory distress. She has no wheezes. She exhibits no tenderness.  Abdominal: Soft. Bowel sounds are normal. She exhibits no distension and no mass. There is no tenderness.  Musculoskeletal: Normal range of motion. She exhibits tenderness. She exhibits no edema.       Spinal tenderness with poor rom generally Gait is slow and steady  Lymphadenopathy:    She has no cervical adenopathy.  Neurological: She is alert. She displays normal reflexes. Coordination normal.  Skin: Skin is warm and dry. No rash noted. No erythema. No pallor.  Psychiatric: She has a normal mood and affect.          Assessment & Plan:

## 2011-06-01 ENCOUNTER — Other Ambulatory Visit: Payer: Self-pay | Admitting: Neurosurgery

## 2011-06-01 DIAGNOSIS — M810 Age-related osteoporosis without current pathological fracture: Secondary | ICD-10-CM

## 2011-06-09 ENCOUNTER — Other Ambulatory Visit: Payer: MEDICARE

## 2011-06-16 ENCOUNTER — Ambulatory Visit
Admission: RE | Admit: 2011-06-16 | Discharge: 2011-06-16 | Disposition: A | Discharge status: Home / Self Care | Payer: MEDICARE | Source: Ambulatory Visit | Attending: Neurosurgery | Admitting: Neurosurgery

## 2011-06-16 DIAGNOSIS — M810 Age-related osteoporosis without current pathological fracture: Secondary | ICD-10-CM

## 2011-10-05 ENCOUNTER — Other Ambulatory Visit: Payer: Self-pay | Admitting: *Deleted

## 2011-10-05 MED ORDER — RALOXIFENE HCL 60 MG PO TABS: 60.0000 mg | ORAL_TABLET | Freq: Every day | ORAL | Status: DC

## 2011-10-05 NOTE — Telephone Encounter (Signed)
Patient's daughter called stating that she has a free voucher for Evista for a month's supply. Patient's daughter wants to know if you will send a new script to [facility] so patient can use this free voucher?

## 2011-10-05 NOTE — Telephone Encounter (Signed)
Will refill electronically  

## 2011-11-09 ENCOUNTER — Ambulatory Visit: Payer: MEDICARE | Admitting: Family Medicine

## 2011-12-14 DIAGNOSIS — M48061 Spinal stenosis, lumbar region without neurogenic claudication: Secondary | ICD-10-CM | POA: Diagnosis not present

## 2012-03-07 DIAGNOSIS — M48061 Spinal stenosis, lumbar region without neurogenic claudication: Secondary | ICD-10-CM | POA: Diagnosis not present

## 2012-03-15 ENCOUNTER — Other Ambulatory Visit: Payer: Self-pay | Admitting: *Deleted

## 2012-03-15 ENCOUNTER — Ambulatory Visit (INDEPENDENT_AMBULATORY_CARE_PROVIDER_SITE_OTHER): Payer: MEDICARE | Admitting: Family Medicine

## 2012-03-15 ENCOUNTER — Encounter: Payer: Self-pay | Admitting: Family Medicine

## 2012-03-15 VITALS — BP 116/80 | HR 72 | Temp 97.9°F | Ht 63.0 in | Wt 154.8 lb

## 2012-03-15 DIAGNOSIS — I10 Essential (primary) hypertension: Secondary | ICD-10-CM | POA: Diagnosis not present

## 2012-03-15 DIAGNOSIS — R300 Dysuria: Secondary | ICD-10-CM | POA: Diagnosis not present

## 2012-03-15 DIAGNOSIS — R7309 Other abnormal glucose: Secondary | ICD-10-CM | POA: Diagnosis not present

## 2012-03-15 DIAGNOSIS — E785 Hyperlipidemia, unspecified: Secondary | ICD-10-CM | POA: Diagnosis not present

## 2012-03-15 DIAGNOSIS — M109 Gout, unspecified: Secondary | ICD-10-CM

## 2012-03-15 DIAGNOSIS — F411 Generalized anxiety disorder: Secondary | ICD-10-CM

## 2012-03-15 DIAGNOSIS — F329 Major depressive disorder, single episode, unspecified: Secondary | ICD-10-CM

## 2012-03-15 LAB — COMPREHENSIVE METABOLIC PANEL
ALT: 12 U/L (ref 0–35)
AST: 22 U/L (ref 0–37)
Albumin: 3.9 g/dL (ref 3.5–5.2)
Alkaline Phosphatase: 45 U/L (ref 39–117)
BUN: 17 mg/dL (ref 6–23)
CO2: 31 mEq/L (ref 19–32)
Calcium: 9.5 mg/dL (ref 8.4–10.5)
Chloride: 104 mEq/L (ref 96–112)
Creatinine, Ser: 0.8 mg/dL (ref 0.4–1.2)
GFR: 77.68 mL/min (ref >60.00)
Glucose, Bld: 102 mg/dL — ABNORMAL HIGH (ref 70–99)
Potassium: 4.6 mEq/L (ref 3.5–5.1)
Sodium: 143 mEq/L (ref 135–145)
Total Bilirubin: 0.7 mg/dL (ref 0.3–1.2)
Total Protein: 6.7 g/dL (ref 6.0–8.3)

## 2012-03-15 LAB — LIPID PANEL
Cholesterol: 142 mg/dL (ref 0–200)
HDL: 37.5 mg/dL — ABNORMAL LOW (ref >39.00)
LDL Cholesterol: 80 mg/dL (ref 0–99)
Total CHOL/HDL Ratio: 4
Triglycerides: 121 mg/dL (ref 0.0–149.0)
VLDL: 24.2 mg/dL (ref 0.0–40.0)

## 2012-03-15 LAB — POCT URINALYSIS DIPSTICK
Bilirubin, UA: NEGATIVE
Blood, UA: NEGATIVE
Glucose, UA: NEGATIVE
Ketones, UA: NEGATIVE
Nitrite, UA: NEGATIVE
Protein, UA: NEGATIVE
Spec Grav, UA: 1.01
Urobilinogen, UA: 0.2
pH, UA: 5

## 2012-03-15 LAB — HEMOGLOBIN A1C: Hgb A1c MFr Bld: 5.5 % (ref 4.6–6.5)

## 2012-03-15 LAB — URIC ACID: Uric Acid, Serum: 4.3 mg/dL (ref 2.4–7.0)

## 2012-03-15 MED ORDER — LOSARTAN POTASSIUM 100 MG PO TABS: 100.0000 mg | ORAL_TABLET | Freq: Every day | ORAL | Status: DC

## 2012-03-15 MED ORDER — ALLOPURINOL 100 MG PO TABS: ORAL_TABLET | ORAL | Status: DC

## 2012-03-15 MED ORDER — ALPRAZOLAM 0.5 MG PO TABS: ORAL_TABLET | ORAL | Status: DC

## 2012-03-15 MED ORDER — PAROXETINE HCL 30 MG PO TABS: 30.0000 mg | ORAL_TABLET | ORAL | Status: DC

## 2012-03-15 MED ORDER — METOPROLOL TARTRATE 25 MG PO TABS: 25.0000 mg | ORAL_TABLET | Freq: Two times a day (BID) | ORAL | Status: DC

## 2012-03-15 NOTE — Assessment & Plan Note (Signed)
a1c today Rev need for low glycemic diet when able

## 2012-03-15 NOTE — Assessment & Plan Note (Signed)
Refilled xanax for careful / sparing prn use - usually for sleep

## 2012-03-15 NOTE — Telephone Encounter (Signed)
I will send it in electronically

## 2012-03-15 NOTE — Assessment & Plan Note (Signed)
Lab today with diet control Rev low sat fat diet

## 2012-03-15 NOTE — Assessment & Plan Note (Signed)
Per pt stable - chronic pain issues are her biggest barrier Tries to stay as active as possible

## 2012-03-15 NOTE — Assessment & Plan Note (Signed)
bp in fair control at this time  No changes needed  Disc lifstyle change with low sodium diet and exercise   No change in med

## 2012-03-15 NOTE — Assessment & Plan Note (Signed)
ua clear today Urged to drink more water - family thinks that may occ cause her symptoms Update if not starting to improve in a week or if worsening

## 2012-03-15 NOTE — Progress Notes (Signed)
Subjective:    Patient ID: Michelle Gregory, female    DOB: [birth year], 78 y.o.   MRN: [id number]  HPI Here for f/u of chronic conditions   2 days of burning with urination  Not drinking enough water    Still mobile - had to go up on her pain med  Still in chronic pain   She cut her allopurinol down to 1 per day  Was accidental- but no gout problems   Wt is up 16 lb with bmi of 27 Diet - has had a very good appetite  Activity= staying as active as she can be   bp is116/80     Today BP Readings from Last 3 Encounters:  03/15/12 116/80  05/05/11 126/68  04/07/11 116/68    No cp or palpitations or headaches or edema  No side effects to medicines    Hx of hyperglycemia Lab Results  Component Value Date   HGBA1C 5.9 07/08/2010   diet -puts honey in her tea , does try to watch her sugar , has cut down on sweets   High lipids - tx with diet  Lab Results  Component Value Date   CHOL 158 04/30/2011   HDL 32.00* 04/30/2011   LDLCALC 102* 04/30/2011   LDLDIRECT 113.7 07/08/2010   TRIG 122.0 04/30/2011   CHOLHDL 5 04/30/2011   fairly well controlled last time  Mood mood is pretty good overall   Pneumovax- 7-8 years ago    Patient Active Problem List  Diagnoses  . HYPERLIPIDEMIA  . GOUT  . ANXIETY  . DEPRESSION  . HYPERTENSION  . VARICOSE VEINS, LOWER EXTREMITIES  . HEMORRHOIDS, INTERNAL  . ALLERGIC RHINITIS  . REACTIVE AIRWAY DISEASE  . GERD  . IBS  . OSTEOARTHRITIS  . HYPERGLYCEMIA, FASTING  . COLON CANCER, HX OF  . UTI  . Degenerative lumbar disc  . Osteopenia  . Dysuria   Past Medical History  Diagnosis Date  . Allergic rhinitis   . Anxiety   . History of colon cancer   . Depression   . GERD (gastroesophageal reflux disease)   . Hyperlipidemia   . Hypertension   . Osteoarthritis   . Vertigo   . Spinal compression fracture    Past Surgical History  Procedure Date  . Colon resection   . Appendectomy   . Cataract extraction   . Tonsillectomy     History  Substance Use Topics  . Smoking status: Never Smoker   . Smokeless tobacco: Not on file  . Alcohol Use: No   Family History  Problem Relation Age of Onset  . Prostate cancer Father   . Heart attack Mother   . Gout Mother    Allergies  Allergen Reactions  . Amoxicillin-Pot Clavulanate     REACTION: diarrhea   Current Outpatient Prescriptions on File Prior to Visit  Medication Sig Dispense Refill  . aspirin 81 MG tablet Take 81 mg by mouth daily.        . calcium-vitamin D (CALCIUM 500 +D) 500 MG tablet Take 1 tablet by mouth daily.       . multivitamin (THERAGRAN) per tablet Take 1 tablet by mouth daily.        . pregabalin (LYRICA) 50 MG capsule 1 tablet by mouth in AM and 2 tabs by mouth at night      . DISCONTD: allopurinol (ZYLOPRIM) 100 MG tablet Take 1 tablet (100 mg total) by mouth 2 (two) times daily.  180 tablet  3  . DISCONTD: losartan (COZAAR) 100 MG tablet Take 1 tablet (100 mg total) by mouth daily.  90 tablet  3  . DISCONTD: metoprolol tartrate (LOPRESSOR) 25 MG tablet Take 1 tablet (25 mg total) by mouth 2 (two) times daily.  180 tablet  3  . DISCONTD: PARoxetine (PAXIL) 30 MG tablet Take 1 tablet (30 mg total) by mouth every morning.  90 tablet  3      Review of Systems Review of Systems  Constitutional: Negative for fever, appetite change, and unexpected weight change. fatigue at times/ stamina not as good as it used to be  Eyes: Negative for pain and visual disturbance.  Respiratory: Negative for cough and shortness of breath.   Cardiovascular: Negative for cp or palpitations    Gastrointestinal: Negative for nausea, diarrhea and constipation.  Genitourinary: Negative for urgency and frequency.  Skin: Negative for pallor or rash   MSK pos for arthritis pain  Neurological: Negative for weakness, light-headedness, numbness and headaches.  Hematological: Negative for adenopathy. Does not bruise/bleed easily.  Psychiatric/Behavioral: Negative for  dysphoric mood. The patient is not nervous/anxious.         Objective:   Physical Exam  Constitutional: She appears well-developed and well-nourished. No distress.       Elderly female , somewhat frail appearing   HENT:  Head: Normocephalic and atraumatic.  Mouth/Throat: Oropharynx is clear and moist.  Eyes: Conjunctivae and EOM are normal. Pupils are equal, round, and reactive to light. No scleral icterus.  Neck: Normal range of motion. Neck supple. No JVD present. Carotid bruit is not present. No thyromegaly present.  Cardiovascular: Normal rate, regular rhythm, normal heart sounds and intact distal pulses.  Exam reveals no gallop.   Pulmonary/Chest: Effort normal and breath sounds normal. No respiratory distress.  Abdominal: Soft. Bowel sounds are normal. She exhibits no distension, no abdominal bruit and no mass. There is no tenderness.  Musculoskeletal: She exhibits no edema and no tenderness.  Lymphadenopathy:    She has no cervical adenopathy.  Neurological: She is alert. She has normal reflexes. No cranial nerve deficit. She exhibits normal muscle tone. Coordination normal.       OA noted, slow gait   Skin: Skin is warm and dry. No rash noted. No erythema. No pallor.  Psychiatric: She has a normal mood and affect.       Pt is somewhat irritable towards the MA today, does not want to review medicines           Assessment & Plan:

## 2012-03-15 NOTE — Patient Instructions (Signed)
Labs today Drink more water We will update you about results and urine as well  Try to limit sweets but eat regular meals  Follow up in about 6 months unless needed earlier

## 2012-03-15 NOTE — Telephone Encounter (Signed)
I noticed that the patient had an appt this am.  Metoprolol, Cozaar and Allopurinol was sent in.  Please advise on the Paroxetine.

## 2012-03-15 NOTE — Assessment & Plan Note (Signed)
She decreased her allopurinol to daily- check uric acid level No gout outbreaks Disc imp of water intake

## 2012-03-16 ENCOUNTER — Encounter: Payer: Self-pay | Admitting: *Deleted

## 2012-03-22 ENCOUNTER — Telehealth: Payer: Self-pay

## 2012-03-22 MED ORDER — TOLTERODINE TARTRATE ER 4 MG PO CP24: 4.0000 mg | ORAL_CAPSULE | Freq: Every day | ORAL | Status: DC

## 2012-03-22 NOTE — Telephone Encounter (Signed)
Pt going out of town 03/27/12 and would like med for urinary leaking. Pt said when she has urge to urinate she does not have accident until she can go to bathroom. Pt said she has urinary leakage when she laughs, sneezes, or coughs. Pt uses [facility] and pt last seen 03/15/12. Pt can be reached at [phone].

## 2012-03-22 NOTE — Telephone Encounter (Signed)
Patient's daughter advised as instructed via telephone. 

## 2012-03-22 NOTE — Telephone Encounter (Signed)
The medications for overactive bladder will help the urge but no the stress incontinence (does not help the cough or sneeze problem) Medication may cause dry mouth/ sedation/ constipation  If any s/s infection - such as burning , let me know Px written for call in  For detrol la for elect transmission  F/u after she gets back in town please

## 2012-03-23 ENCOUNTER — Telehealth: Payer: Self-pay | Admitting: *Deleted

## 2012-03-23 NOTE — Telephone Encounter (Signed)
Called patient x 2 and line was busy each time.  Will call back later.

## 2012-03-23 NOTE — Telephone Encounter (Signed)
Since I don't know what her insurance covers, we need to try another medicine -- have her call  Her insurance and see what is covered for urge incontinence/ overactive bladder and let me know so we can pick something else, thanks

## 2012-03-23 NOTE — Telephone Encounter (Signed)
Correction from below:  PA is needed for Detrol LA.  Called [facility] at [phone] and PA form will be faxed to our office.

## 2012-03-23 NOTE — Telephone Encounter (Signed)
Received fax from pharmacy stating that patient's insurance will not cover the cost of Detrol LA.  They would like to switch patient to Brand Name Oxybutynin or Vesicare.  Please advise.

## 2012-03-23 NOTE — Telephone Encounter (Signed)
PA form in your IN box. 

## 2012-03-24 NOTE — Telephone Encounter (Signed)
Spoke with patient via telephone and she stated that she doesn't want to bother with this right now, she is leaving for the beach.  She will contact us when she returns to pursue this further.

## 2012-03-25 DIAGNOSIS — M47817 Spondylosis without myelopathy or radiculopathy, lumbosacral region: Secondary | ICD-10-CM | POA: Diagnosis not present

## 2012-03-25 DIAGNOSIS — M5137 Other intervertebral disc degeneration, lumbosacral region: Secondary | ICD-10-CM | POA: Diagnosis not present

## 2012-04-04 DIAGNOSIS — M5137 Other intervertebral disc degeneration, lumbosacral region: Secondary | ICD-10-CM | POA: Diagnosis not present

## 2012-04-12 NOTE — Telephone Encounter (Signed)
Pt was going to call back if I am right, and let us know if she wants any medicine at all , then I would choose vesicare Will wait to hear from her

## 2012-04-12 NOTE — Telephone Encounter (Signed)
Insurance formulary alternatives [for Detrol LA] are: Oxybutynin ER and/or Vesicare/SLS

## 2012-04-15 ENCOUNTER — Telehealth: Payer: Self-pay

## 2012-04-15 NOTE — Telephone Encounter (Signed)
[facility] faxed PA for Detrol LA. Called [phone] spoke with Wyatt Mage did PA request over phone. Pending review.

## 2012-04-19 NOTE — Telephone Encounter (Signed)
Please see phone note that began on 05.22.13- Per conversation with patient, after receiving PA form for Detrol LA, no medication wanted at this time for this matter; Michelle Gregory will let MAT know if she changes her mind & would like to try a Rx [Vesicare would be fine with her at that time/covered by Insurance-no PA needed/SLS Discontinue PA process for Detrol LA per patient; MAT informed/SLS

## 2012-04-19 NOTE — Telephone Encounter (Signed)
Per conversation with patient, after receiving PA form for Detrol LA, No medication wanted at this time for this matter; Michelle Gregory will let MAT know if she changes her mind & would like to try a Rx [Vesicare would be fine with her at that time/covered by Insurance-no PA needed/SLS Discontinue PA process for Detrol LA per patient; MAT informed/SLS

## 2012-04-20 NOTE — Telephone Encounter (Signed)
Sandy at [facility] notified as instructed by phone.

## 2012-06-06 ENCOUNTER — Telehealth: Payer: Self-pay | Admitting: *Deleted

## 2012-06-06 MED ORDER — RALOXIFENE HCL 60 MG PO TABS: 60.0000 mg | ORAL_TABLET | Freq: Every day | ORAL | Status: AC

## 2012-06-06 NOTE — Telephone Encounter (Signed)
Daughter requests refills on pts' evista- this isn't on med list.  Pt had been taking it but decided she wanted to stop it, so didn't get it filled for a couple of months.  Wants to change to [facility].  Also, daughter is asking if you would take her on as a new patient.  She doesn't have a PCP, has no known health problems but did recently have gallbladder surgery.  Please advise.

## 2012-06-06 NOTE — Telephone Encounter (Signed)
LMOVM of contact number. 

## 2012-06-06 NOTE — Telephone Encounter (Signed)
Will send evista electronically to [facility] Let me know if side eff or problems  Let her daughter know that I am sometimes difficult to get an appt with since I have a big practice but if she wants to est put her in a 30 min appt

## 2012-06-27 DIAGNOSIS — M5137 Other intervertebral disc degeneration, lumbosacral region: Secondary | ICD-10-CM | POA: Diagnosis not present

## 2012-08-29 DIAGNOSIS — M5137 Other intervertebral disc degeneration, lumbosacral region: Secondary | ICD-10-CM | POA: Diagnosis not present

## 2012-08-29 DIAGNOSIS — M545 Low back pain, unspecified: Secondary | ICD-10-CM | POA: Diagnosis not present

## 2012-08-29 DIAGNOSIS — G894 Chronic pain syndrome: Secondary | ICD-10-CM | POA: Diagnosis not present

## 2012-09-19 ENCOUNTER — Ambulatory Visit (INDEPENDENT_AMBULATORY_CARE_PROVIDER_SITE_OTHER): Payer: MEDICARE | Admitting: Family Medicine

## 2012-09-19 ENCOUNTER — Encounter: Payer: Self-pay | Admitting: Family Medicine

## 2012-09-19 VITALS — BP 118/74 | HR 75 | Temp 98.1°F | Ht 63.0 in | Wt 149.8 lb

## 2012-09-19 DIAGNOSIS — E785 Hyperlipidemia, unspecified: Secondary | ICD-10-CM | POA: Diagnosis not present

## 2012-09-19 DIAGNOSIS — R300 Dysuria: Secondary | ICD-10-CM

## 2012-09-19 DIAGNOSIS — I10 Essential (primary) hypertension: Secondary | ICD-10-CM

## 2012-09-19 DIAGNOSIS — R7309 Other abnormal glucose: Secondary | ICD-10-CM | POA: Diagnosis not present

## 2012-09-19 DIAGNOSIS — F411 Generalized anxiety disorder: Secondary | ICD-10-CM

## 2012-09-19 DIAGNOSIS — M109 Gout, unspecified: Secondary | ICD-10-CM | POA: Diagnosis not present

## 2012-09-19 LAB — HEMOGLOBIN A1C: Hgb A1c MFr Bld: 5.4 % (ref 4.6–6.5)

## 2012-09-19 LAB — POCT URINALYSIS DIPSTICK
Bilirubin, UA: NEGATIVE
Blood, UA: NEGATIVE
Glucose, UA: NEGATIVE
Ketones, UA: NEGATIVE
Leukocytes, UA: NEGATIVE
Nitrite, UA: NEGATIVE
Protein, UA: NEGATIVE
Spec Grav, UA: 1.01
Urobilinogen, UA: 0.2
pH, UA: 6

## 2012-09-19 LAB — COMPREHENSIVE METABOLIC PANEL
ALT: 12 U/L (ref 0–35)
AST: 20 U/L (ref 0–37)
Albumin: 4.2 g/dL (ref 3.5–5.2)
Alkaline Phosphatase: 49 U/L (ref 39–117)
BUN: 23 mg/dL (ref 6–23)
CO2: 30 mEq/L (ref 19–32)
Calcium: 9.4 mg/dL (ref 8.4–10.5)
Chloride: 102 mEq/L (ref 96–112)
Creatinine, Ser: 0.8 mg/dL (ref 0.4–1.2)
GFR: 69.03 mL/min (ref >60.00)
Glucose, Bld: 103 mg/dL — ABNORMAL HIGH (ref 70–99)
Potassium: 4.8 mEq/L (ref 3.5–5.1)
Sodium: 140 mEq/L (ref 135–145)
Total Bilirubin: 0.8 mg/dL (ref 0.3–1.2)
Total Protein: 6.7 g/dL (ref 6.0–8.3)

## 2012-09-19 LAB — LIPID PANEL
Cholesterol: 158 mg/dL (ref 0–200)
HDL: 30.9 mg/dL — ABNORMAL LOW (ref >39.00)
LDL Cholesterol: 90 mg/dL (ref 0–99)
Total CHOL/HDL Ratio: 5
Triglycerides: 185 mg/dL — ABNORMAL HIGH (ref 0.0–149.0)
VLDL: 37 mg/dL (ref 0.0–40.0)

## 2012-09-19 LAB — URIC ACID: Uric Acid, Serum: 4.9 mg/dL (ref 2.4–7.0)

## 2012-09-19 MED ORDER — ALPRAZOLAM 0.5 MG PO TABS: ORAL_TABLET | ORAL | Status: DC

## 2012-09-19 NOTE — Assessment & Plan Note (Signed)
No episodes with better diet and once daily allopurinol  Uric acid today and cmet

## 2012-09-19 NOTE — Patient Instructions (Addendum)
If you are interested in a shingles/zoster vaccine - call your insurance to check on coverage,( you should not get it within 1 month of other vaccines) , then call us for a prescription  for it to take to a pharmacy that gives the shot , or make a nurse visit to get it here depending on your coverage I'm glad your mood is doing better  Labs today  Follow up in 6 months for annual exam with labs prior

## 2012-09-19 NOTE — Assessment & Plan Note (Signed)
Better diet lately  Disc low sat fat diet  Lab today

## 2012-09-19 NOTE — Assessment & Plan Note (Signed)
bp in fair control at this time  No changes needed  Disc lifstyle change with low sodium diet and exercise  Lab today 

## 2012-09-19 NOTE — Assessment & Plan Note (Signed)
Doing much better On paxil and xanas (less often)- refilled that  Tries to stay active Living with daughter now

## 2012-09-19 NOTE — Assessment & Plan Note (Signed)
ua neg emph again imp of more water intake and min tea and other bev Update if not starting to improve in a week or if worsening

## 2012-09-19 NOTE — Progress Notes (Signed)
Subjective:    Patient ID: Michelle Gregory, female    DOB: 1923/08/28, 78 y.o.   MRN: [id number]  HPI Here for f/u of chronic medical problems   Is having dysuria again ua is normal  She does not drink enough water  Drinks too much tea   Otherwise doing about the same  Still chronic pain - especially in her legs  Had ortho f/u every 3 mo - no new interventions - cannot be as active as she wants to -- and this is frustrating   Hyperglycemia Lab Results  Component Value Date   HGBA1C 5.5 03/15/2012    Hyperlipidemia  Diet controlled  Lab Results  Component Value Date   CHOL 142 03/15/2012   HDL 37.50* 03/15/2012   LDLCALC 80 03/15/2012   LDLDIRECT 113.7 07/08/2010   TRIG 121.0 03/15/2012   CHOLHDL 4 03/15/2012   eating less junk food   bp is stable today  No cp or palpitations or headaches or edema  No side effects to medicines  BP Readings from Last 3 Encounters:  09/19/12 118/74  03/15/12 116/80  05/05/11 126/68      Hx of gout and takes allopurinol No gout flares  No longer taking tylenol also  And no longer eats junk food  Lab Results  Component Value Date   ALT 12 03/15/2012   AST 22 03/15/2012   ALKPHOS 45 03/15/2012   BILITOT 0.7 03/15/2012     Mood- hx of anx and dep  Doing much better lately  Not needing xanax as often   Zoster status- has not looked into coverage Has not had shingles  Patient Active Problem List  Diagnosis  . HYPERLIPIDEMIA  . GOUT  . ANXIETY  . DEPRESSION  . HYPERTENSION  . VARICOSE VEINS, LOWER EXTREMITIES  . HEMORRHOIDS, INTERNAL  . ALLERGIC RHINITIS  . REACTIVE AIRWAY DISEASE  . GERD  . IBS  . OSTEOARTHRITIS  . HYPERGLYCEMIA, FASTING  . COLON CANCER, HX OF  . UTI  . Degenerative lumbar disc  . Osteopenia  . Dysuria  . Gout   Past Medical History  Diagnosis Date  . Allergic rhinitis   . Anxiety   . History of colon cancer   . Depression   . GERD (gastroesophageal reflux disease)   . Hyperlipidemia   .  Hypertension   . Osteoarthritis   . Vertigo   . Spinal compression fracture    Past Surgical History  Procedure Date  . Colon resection   . Appendectomy   . Cataract extraction   . Tonsillectomy    History  Substance Use Topics  . Smoking status: Never Smoker   . Smokeless tobacco: Not on file  . Alcohol Use: No   Family History  Problem Relation Age of Onset  . Prostate cancer Father   . Heart attack Mother   . Gout Mother    Allergies  Allergen Reactions  . Amoxicillin-Pot Clavulanate     REACTION: diarrhea   Current Outpatient Prescriptions on File Prior to Visit  Medication Sig Dispense Refill  . allopurinol (ZYLOPRIM) 100 MG tablet Take 2 tablets (=200 mg) by mouth daily.  180 tablet  3  . ALPRAZolam (XANAX) 0.5 MG tablet 1/2 pill by mouth up to twice daily as needed for anxiety  30 tablet  2  . aspirin 81 MG tablet Take 81 mg by mouth daily.        . calcium-vitamin D (CALCIUM 500 +D) 500 MG tablet Take 1  tablet by mouth daily.       Marland Kitchen HYDROcodone-acetaminophen (VICODIN) 5-500 MG per tablet Take 1 tablet by mouth every 6 (six) hours as needed.      Marland Kitchen losartan (COZAAR) 100 MG tablet Take 1 tablet (100 mg total) by mouth daily.  90 tablet  3  . metoprolol tartrate (LOPRESSOR) 25 MG tablet Take 1 tablet (25 mg total) by mouth 2 (two) times daily.  180 tablet  3  . multivitamin (THERAGRAN) per tablet Take 1 tablet by mouth daily.        Marland Kitchen PARoxetine (PAXIL) 30 MG tablet Take 1 tablet (30 mg total) by mouth every morning.  90 tablet  3  . pregabalin (LYRICA) 50 MG capsule 1 tablet by mouth in AM and 2 tabs by mouth at night          Review of Systems Review of Systems  Constitutional: Negative for fever, appetite change, fatigue and unexpected weight change.  Eyes: Negative for pain and visual disturbance.  Respiratory: Negative for cough and shortness of breath.   Cardiovascular: Negative for cp or palpitations    Gastrointestinal: Negative for nausea, diarrhea and  constipation.  Genitourinary: Negative for urgency and frequency.  Skin: Negative for pallor or rash   Neurological: Negative for weakness, light-headedness, numbness and headaches.  Hematological: Negative for adenopathy. Does not bruise/bleed easily.  Psychiatric/Behavioral: Negative for dysphoric mood. Anxiety is in good control .         Objective:   Physical Exam  Constitutional: She appears well-developed and well-nourished. No distress.  HENT:  Head: Normocephalic and atraumatic.  Eyes: Conjunctivae normal and EOM are normal. Pupils are equal, round, and reactive to light. Right eye exhibits no discharge. Left eye exhibits no discharge.  Neck: Normal range of motion. Neck supple. No JVD present. Carotid bruit is not present. No thyromegaly present.  Cardiovascular: Normal rate, regular rhythm, normal heart sounds and intact distal pulses.  Exam reveals no gallop.   Pulmonary/Chest: Effort normal and breath sounds normal. No respiratory distress. She has no wheezes. She exhibits no tenderness.  Abdominal: Soft. Bowel sounds are normal. She exhibits no distension, no abdominal bruit and no mass. There is no tenderness.  Musculoskeletal: She exhibits no edema.  Lymphadenopathy:    She has no cervical adenopathy.  Neurological: She is alert. She has normal reflexes. No cranial nerve deficit. She exhibits normal muscle tone. Coordination normal.  Skin: Skin is warm and dry. No rash noted. No erythema. No pallor.  Psychiatric: She has a normal mood and affect.          Assessment & Plan:

## 2012-09-19 NOTE — Assessment & Plan Note (Signed)
a1c today  Expect improved

## 2012-09-21 ENCOUNTER — Encounter: Payer: Self-pay | Admitting: *Deleted

## 2013-01-30 DIAGNOSIS — M545 Low back pain, unspecified: Secondary | ICD-10-CM | POA: Diagnosis not present

## 2013-01-30 DIAGNOSIS — G894 Chronic pain syndrome: Secondary | ICD-10-CM | POA: Diagnosis not present

## 2013-01-30 DIAGNOSIS — M48061 Spinal stenosis, lumbar region without neurogenic claudication: Secondary | ICD-10-CM | POA: Diagnosis not present

## 2013-01-30 DIAGNOSIS — M5137 Other intervertebral disc degeneration, lumbosacral region: Secondary | ICD-10-CM | POA: Diagnosis not present

## 2013-03-13 ENCOUNTER — Encounter: Payer: MEDICARE | Admitting: Family Medicine

## 2013-03-20 ENCOUNTER — Encounter: Payer: Self-pay | Admitting: Radiology

## 2013-03-21 ENCOUNTER — Ambulatory Visit (INDEPENDENT_AMBULATORY_CARE_PROVIDER_SITE_OTHER): Payer: MEDICARE | Admitting: Family Medicine

## 2013-03-21 ENCOUNTER — Encounter: Payer: Self-pay | Admitting: Family Medicine

## 2013-03-21 VITALS — BP 130/86 | HR 70 | Temp 97.8°F | Ht 63.0 in | Wt 151.5 lb

## 2013-03-21 DIAGNOSIS — Z0000 Encounter for general adult medical examination without abnormal findings: Secondary | ICD-10-CM | POA: Diagnosis not present

## 2013-03-21 DIAGNOSIS — R300 Dysuria: Secondary | ICD-10-CM

## 2013-03-21 DIAGNOSIS — M949 Disorder of cartilage, unspecified: Secondary | ICD-10-CM | POA: Diagnosis not present

## 2013-03-21 DIAGNOSIS — E785 Hyperlipidemia, unspecified: Secondary | ICD-10-CM

## 2013-03-21 DIAGNOSIS — I10 Essential (primary) hypertension: Secondary | ICD-10-CM | POA: Diagnosis not present

## 2013-03-21 DIAGNOSIS — F411 Generalized anxiety disorder: Secondary | ICD-10-CM

## 2013-03-21 DIAGNOSIS — M899 Disorder of bone, unspecified: Secondary | ICD-10-CM | POA: Diagnosis not present

## 2013-03-21 DIAGNOSIS — R7309 Other abnormal glucose: Secondary | ICD-10-CM | POA: Diagnosis not present

## 2013-03-21 DIAGNOSIS — M8580 Other specified disorders of bone density and structure, unspecified site: Secondary | ICD-10-CM

## 2013-03-21 LAB — COMPREHENSIVE METABOLIC PANEL
ALT: 14 U/L (ref 0–35)
AST: 24 U/L (ref 0–37)
Albumin: 4.2 g/dL (ref 3.5–5.2)
Alkaline Phosphatase: 47 U/L (ref 39–117)
BUN: 21 mg/dL (ref 6–23)
CO2: 31 mEq/L (ref 19–32)
Calcium: 9.8 mg/dL (ref 8.4–10.5)
Chloride: 99 mEq/L (ref 96–112)
Creatinine, Ser: 0.9 mg/dL (ref 0.4–1.2)
GFR: 62.79 mL/min (ref >60.00)
Glucose, Bld: 94 mg/dL (ref 70–99)
Potassium: 4 mEq/L (ref 3.5–5.1)
Sodium: 139 mEq/L (ref 135–145)
Total Bilirubin: 1 mg/dL (ref 0.3–1.2)
Total Protein: 7.2 g/dL (ref 6.0–8.3)

## 2013-03-21 LAB — POCT URINALYSIS DIPSTICK
Bilirubin, UA: NEGATIVE
Blood, UA: NEGATIVE
Glucose, UA: NEGATIVE
Ketones, UA: NEGATIVE
Nitrite, UA: NEGATIVE
Spec Grav, UA: 1.02
Urobilinogen, UA: 0.2
pH, UA: 6

## 2013-03-21 LAB — CBC WITH DIFFERENTIAL/PLATELET
Basophils Absolute: 0 10*3/uL (ref 0.0–0.1)
Basophils Relative: 0.6 % (ref 0.0–3.0)
Eosinophils Absolute: 0.3 10*3/uL (ref 0.0–0.7)
Eosinophils Relative: 3.6 % (ref 0.0–5.0)
HCT: 44.1 % (ref 36.0–46.0)
Hemoglobin: 15.1 g/dL — ABNORMAL HIGH (ref 12.0–15.0)
Lymphocytes Relative: 16.5 % (ref 12.0–46.0)
Lymphs Abs: 1.3 10*3/uL (ref 0.7–4.0)
MCHC: 34.2 g/dL (ref 30.0–36.0)
MCV: 97.2 fl (ref 78.0–100.0)
Monocytes Absolute: 0.6 10*3/uL (ref 0.1–1.0)
Monocytes Relative: 7.3 % (ref 3.0–12.0)
Neutro Abs: 5.8 10*3/uL (ref 1.4–7.7)
Neutrophils Relative %: 72 % (ref 43.0–77.0)
Platelets: 199 10*3/uL (ref 150.0–400.0)
RBC: 4.54 Mil/uL (ref 3.87–5.11)
RDW: 12.8 % (ref 11.5–14.6)
WBC: 8 10*3/uL (ref 4.5–10.5)

## 2013-03-21 LAB — TSH: TSH: 1.04 u[IU]/mL (ref 0.35–5.50)

## 2013-03-21 LAB — POCT UA - MICROSCOPIC ONLY
Casts, Ur, LPF, POC: 0
Crystals, Ur, HPF, POC: 0
Yeast, UA: 0

## 2013-03-21 LAB — LIPID PANEL
Cholesterol: 169 mg/dL (ref 0–200)
HDL: 33 mg/dL — ABNORMAL LOW (ref >39.00)
LDL Cholesterol: 100 mg/dL — ABNORMAL HIGH (ref 0–99)
Total CHOL/HDL Ratio: 5
Triglycerides: 178 mg/dL — ABNORMAL HIGH (ref 0.0–149.0)
VLDL: 35.6 mg/dL (ref 0.0–40.0)

## 2013-03-21 LAB — HEMOGLOBIN A1C: Hgb A1c MFr Bld: 5.6 % (ref 4.6–6.5)

## 2013-03-21 MED ORDER — ALPRAZOLAM 0.5 MG PO TABS: ORAL_TABLET | ORAL | Status: DC

## 2013-03-21 MED ORDER — RALOXIFENE HCL 60 MG PO TABS: 60.0000 mg | ORAL_TABLET | Freq: Every day | ORAL | Status: DC

## 2013-03-21 NOTE — Progress Notes (Signed)
Subjective:    Patient ID: Michelle Gregory, female    DOB: May 26, 1923, 79 y.o.   MRN: [id number]  HPI I have personally reviewed the Medicare Annual Wellness questionnaire and have noted 1. The patient's medical and social history 2. Their use of alcohol, tobacco or illicit drugs 3. Their current medications and supplements 4. The patient's functional ability including ADL's, fall risks, home safety risks and hearing or visual             impairment. 5. Diet and physical activities 6. Evidence for depression or mood disorders  The patients weight, height, BMI have been recorded in the chart and visual acuity is per eye clinic.  I have made referrals, counseling and provided education to the patient based review of the above and I have provided the pt with a written personalized care plan for preventive services.  Is doing fair overall  Has chronic pain   See scanned forms.  Routine anticipatory guidance given to patient.  See health maintenance. Flu 11/13 Shingles - may be interested in the vaccine if insurance covers it  PNA 1/04 Tetanus Td 1/04  Colon 12/03 , - does not want any more of them -- she has had colon cancer in the past -no bowel changes  Breast cancer screening -not interested any more in mammograms  Self breast exam - no lumps or changes  Advance directive- has a living will and POA  Cognitive function addressed- see scanned forms- and if abnormal then additional documentation follows.  No concerning memory issues   Falls - none in the past year - she is very careful   Mood- has been ok - no depression , and stays motivated   PMH and SH reviewed  Meds, vitals, and allergies reviewed.   Wt is up 2 lb with bmi of 26 Gets full easily-does not eat much  She does eat a lot of high protein foods - nuts/pb and cheese and some meat   Osteopenia dexa 8/12 Not candidate for bisphosphenate  She took evista for a while - but could not afford it - will try again when  not in donut hole financially Hx of comp fx in the past No new fractures   hyperlipiemia  Lab Results  Component Value Date   CHOL 158 09/19/2012   HDL 30.90* 09/19/2012   LDLCALC 90 09/19/2012   LDLDIRECT 113.7 07/08/2010   TRIG 185.0* 09/19/2012   CHOLHDL 5 09/19/2012     bp is stable today  No cp or palpitations or headaches or edema  No side effects to medicines  BP Readings from Last 3 Encounters:  03/21/13 130/86  09/19/12 118/74  03/15/12 116/80     Hyperglycemia Diet Lab Results  Component Value Date   HGBA1C 5.4 09/19/2012     Has stress and urge incontinence She wears panty liners for this  Is having burning to urinate   ROS: See HPI.  Otherwise negative.    Patient Active Problem List   Diagnosis Date Noted  . Encounter for Medicare annual wellness exam 03/21/2013  . Gout 09/19/2012  . Dysuria 03/15/2012  . Osteopenia 05/05/2011  . Degenerative lumbar disc 01/20/2011  . VARICOSE VEINS, LOWER EXTREMITIES 09/04/2010  . HYPERGLYCEMIA, FASTING 01/08/2010  . HYPERLIPIDEMIA 05/09/2008  . GOUT 05/09/2008  . ANXIETY 05/09/2008  . DEPRESSION 05/09/2008  . HYPERTENSION 05/09/2008  . HEMORRHOIDS, INTERNAL 05/09/2008  . ALLERGIC RHINITIS 05/09/2008  . REACTIVE AIRWAY DISEASE 05/09/2008  . GERD 05/09/2008  . IBS  05/09/2008  . OSTEOARTHRITIS 05/09/2008  . COLON CANCER, HX OF 05/09/2008   Past Medical History  Diagnosis Date  . Allergic rhinitis   . Anxiety   . History of colon cancer   . Depression   . GERD (gastroesophageal reflux disease)   . Hyperlipidemia   . Hypertension   . Osteoarthritis   . Vertigo   . Spinal compression fracture    Past Surgical History  Procedure Laterality Date  . Colon resection    . Appendectomy    . Cataract extraction    . Tonsillectomy     History  Substance Use Topics  . Smoking status: Never Smoker   . Smokeless tobacco: Not on file  . Alcohol Use: No   Family History  Problem Relation Age of Onset  .  Prostate cancer Father   . Heart attack Mother   . Gout Mother    Allergies  Allergen Reactions  . Amoxicillin-Pot Clavulanate     REACTION: diarrhea   Current Outpatient Prescriptions on File Prior to Visit  Medication Sig Dispense Refill  . allopurinol (ZYLOPRIM) 100 MG tablet Take 2 tablets (=200 mg) by mouth daily.  180 tablet  3  . Ascorbic Acid (VITAMIN C) 100 MG tablet Take 100 mg by mouth daily.      Marland Kitchen aspirin 81 MG tablet Take 81 mg by mouth daily.        . calcium-vitamin D (CALCIUM 500 +D) 500 MG tablet Take 1 tablet by mouth daily.       Marland Kitchen glucosamine-chondroitin 500-400 MG tablet Take 1 tablet by mouth daily.      Marland Kitchen HYDROcodone-acetaminophen (VICODIN) 5-500 MG per tablet Take 1 tablet by mouth every 6 (six) hours as needed.      Marland Kitchen losartan (COZAAR) 100 MG tablet Take 1 tablet (100 mg total) by mouth daily.  90 tablet  3  . metoprolol tartrate (LOPRESSOR) 25 MG tablet Take 1 tablet (25 mg total) by mouth 2 (two) times daily.  180 tablet  3  . multivitamin (THERAGRAN) per tablet Take 1 tablet by mouth daily.        Marland Kitchen PARoxetine (PAXIL) 30 MG tablet Take 1 tablet (30 mg total) by mouth every morning.  90 tablet  3  . pregabalin (LYRICA) 50 MG capsule 1 tablet by mouth in AM and 2 tabs by mouth at night       No current facility-administered medications on file prior to visit.    Review of Systems Review of Systems  Constitutional: Negative for fever, appetite change, fatigue and unexpected weight change.  Eyes: Negative for pain and visual disturbance.  Respiratory: Negative for cough and shortness of breath.   Cardiovascular: Negative for cp or palpitations    Gastrointestinal: Negative for nausea, diarrhea and constipation.  Genitourinary: Negative for urgency and frequency.  Skin: Negative for pallor or rash   MSK pos for chronic back pain that is improved with injections  Neurological: Negative for weakness, light-headedness, numbness and headaches.  Hematological:  Negative for adenopathy. Does not bruise/bleed easily.  Psychiatric/Behavioral: Negative for dysphoric mood. The patient is not nervous/anxious.         Objective:   Physical Exam  Constitutional: She appears well-developed and well-nourished. No distress.  HENT:  Head: Normocephalic and atraumatic.  Right Ear: External ear normal.  Left Ear: External ear normal.  Nose: Nose normal.  Eyes: Conjunctivae and EOM are normal. Pupils are equal, round, and reactive to light. Right eye exhibits no  discharge. Left eye exhibits no discharge. No scleral icterus.  Neck: Normal range of motion. Neck supple. No JVD present. Carotid bruit is not present. No thyromegaly present.  Cardiovascular: Normal rate, regular rhythm, normal heart sounds and intact distal pulses.  Exam reveals no gallop.   Pulmonary/Chest: Effort normal and breath sounds normal. No respiratory distress. She has no wheezes. She has no rales.  Abdominal: Soft. Bowel sounds are normal. She exhibits no distension, no abdominal bruit and no mass. There is no tenderness.  Musculoskeletal: She exhibits no edema and no tenderness.  Limited rom spine  Lymphadenopathy:    She has no cervical adenopathy.  Neurological: She is alert. She has normal reflexes. No cranial nerve deficit. She exhibits normal muscle tone. Coordination normal.  Skin: Skin is warm and dry. No rash noted. No erythema. No pallor.  Psychiatric: She has a normal mood and affect.          Assessment & Plan:

## 2013-03-21 NOTE — Assessment & Plan Note (Signed)
bp in fair control at this time  No changes needed  Disc lifstyle change with low sodium diet and exercise  Lab today 

## 2013-03-21 NOTE — Assessment & Plan Note (Signed)
Lab Results  Component Value Date   HGBA1C 5.6 03/21/2013   stable - disc low glycemic diet

## 2013-03-21 NOTE — Assessment & Plan Note (Signed)
Controlled on paxil with xanax prn

## 2013-03-21 NOTE — Assessment & Plan Note (Signed)
Disc goals for lipids and reasons to control them Rev labs with pt Rev low sat fat diet in detail Diet controlled  

## 2013-03-21 NOTE — Assessment & Plan Note (Signed)
Reviewed health habits including diet and exercise and skin cancer prevention Also reviewed health mt list, fam hx and immunizations  See HPI Labs reviewed  

## 2013-03-21 NOTE — Assessment & Plan Note (Signed)
ua today is borderline  Sent for cx  Disc overactive bladder

## 2013-03-21 NOTE — Patient Instructions (Addendum)
Labs today  If you are interested in a shingles/zoster vaccine - call your insurance to check on coverage,( you should not get it within 1 month of other vaccines) , then call us for a prescription  for it to take to a pharmacy that gives the shot , or make a nurse visit to get it here depending on your coverage You need a tetanus shot -since medicare does not cover it - get that at [facility] Leave urine sample on the way out - we will call you if it looks infected  Keep up a good water intake  Stay active and eat a healthy diet

## 2013-03-21 NOTE — Assessment & Plan Note (Signed)
Rev last dexa Pt intol of bisphosphenate and cannot afford evista when in donut hole  Will take the months she can afford it  Rev ca and D and exercise  Disc safety to prev fx

## 2013-03-22 ENCOUNTER — Encounter: Payer: Self-pay | Admitting: *Deleted

## 2013-03-23 LAB — URINE CULTURE: Colony Count: 5000

## 2013-03-24 DIAGNOSIS — Z79899 Other long term (current) drug therapy: Secondary | ICD-10-CM | POA: Diagnosis not present

## 2013-03-31 ENCOUNTER — Other Ambulatory Visit: Payer: Self-pay | Admitting: *Deleted

## 2013-03-31 DIAGNOSIS — I10 Essential (primary) hypertension: Secondary | ICD-10-CM

## 2013-03-31 MED ORDER — METOPROLOL TARTRATE 25 MG PO TABS: 25.0000 mg | ORAL_TABLET | Freq: Two times a day (BID) | ORAL | Status: DC

## 2013-03-31 MED ORDER — PAROXETINE HCL 30 MG PO TABS: 30.0000 mg | ORAL_TABLET | ORAL | Status: DC

## 2013-03-31 MED ORDER — LOSARTAN POTASSIUM 100 MG PO TABS: 100.0000 mg | ORAL_TABLET | Freq: Every day | ORAL | Status: DC

## 2013-03-31 MED ORDER — ALLOPURINOL 100 MG PO TABS: ORAL_TABLET | ORAL | Status: DC

## 2013-04-17 ENCOUNTER — Encounter: Payer: Self-pay | Admitting: Family Medicine

## 2013-06-12 DIAGNOSIS — M5137 Other intervertebral disc degeneration, lumbosacral region: Secondary | ICD-10-CM | POA: Diagnosis not present

## 2013-06-12 DIAGNOSIS — M545 Low back pain, unspecified: Secondary | ICD-10-CM | POA: Diagnosis not present

## 2013-06-12 DIAGNOSIS — G894 Chronic pain syndrome: Secondary | ICD-10-CM | POA: Diagnosis not present

## 2013-06-14 ENCOUNTER — Other Ambulatory Visit: Payer: Self-pay | Admitting: Family Medicine

## 2013-06-14 MED ORDER — MECLIZINE HCL 32 MG PO TABS: 32.0000 mg | ORAL_TABLET | Freq: Three times a day (TID) | ORAL | Status: DC | PRN

## 2013-06-14 NOTE — Telephone Encounter (Signed)
Please refill times one  

## 2013-06-14 NOTE — Telephone Encounter (Signed)
OK to refill

## 2013-06-14 NOTE — Telephone Encounter (Signed)
done

## 2013-06-16 ENCOUNTER — Other Ambulatory Visit: Payer: Self-pay | Admitting: *Deleted

## 2013-06-16 MED ORDER — PAROXETINE HCL 30 MG PO TABS: 30.0000 mg | ORAL_TABLET | ORAL | Status: DC

## 2013-06-16 NOTE — Telephone Encounter (Signed)
done

## 2013-06-16 NOTE — Telephone Encounter (Signed)
Please refill for a year thanks 

## 2013-06-16 NOTE — Telephone Encounter (Signed)
Fax refill request, please advise  

## 2013-06-19 ENCOUNTER — Other Ambulatory Visit: Payer: Self-pay

## 2013-06-19 MED ORDER — PAROXETINE HCL 30 MG PO TABS: 30.0000 mg | ORAL_TABLET | ORAL | Status: DC

## 2013-06-19 NOTE — Telephone Encounter (Signed)
Larita Fife spoke with [facility] this afternoon and do not have refill sent 06/16/13.advised will resend.

## 2013-06-28 ENCOUNTER — Telehealth: Payer: Self-pay

## 2013-06-28 MED ORDER — MECLIZINE HCL 25 MG PO TABS: 25.0000 mg | ORAL_TABLET | Freq: Three times a day (TID) | ORAL | Status: DC | PRN

## 2013-06-28 NOTE — Telephone Encounter (Signed)
Stacy with [facility] left v/m to confirm strength of Antivert 32 mg sent electonically on 06/14/13; ref # [id number].Please advise.

## 2013-06-28 NOTE — Telephone Encounter (Signed)
No -I want 25 mg -will re send  Thanks

## 2013-06-28 NOTE — Telephone Encounter (Signed)
Advise pharmacist with [facility] that the 32mg  strength was incorrect and Dr. Milinda Antis sent the correct dose to pharmacy

## 2013-08-31 ENCOUNTER — Other Ambulatory Visit: Payer: Self-pay | Admitting: Family Medicine

## 2013-08-31 NOTE — Telephone Encounter (Signed)
Electronic refill request, last appt was CPE on 03/21/13 and no future appt, please advise

## 2013-09-03 NOTE — Telephone Encounter (Signed)
Please schedule PE for may 20 or later and refill until then

## 2013-09-06 NOTE — Telephone Encounter (Signed)
CPE scheduled and meds refilled until then

## 2013-09-06 NOTE — Telephone Encounter (Signed)
Left voicemail requesting pt to call office 

## 2013-10-04 ENCOUNTER — Telehealth: Payer: Self-pay

## 2013-10-04 NOTE — Telephone Encounter (Signed)
[name] left v/m; pt has sinus drainage that is causing nausea and request med called in. Left v/m at [phone] requesting Larita Fife to call office back to get additional info; any fever or other symptoms and what pharmacy.

## 2013-10-04 NOTE — Telephone Encounter (Signed)
I recommend an antihistamine like claritin or zyrtec otc  - as directed  F/u if worse or no imp

## 2013-10-04 NOTE — Telephone Encounter (Signed)
[name] left v/m requesting cb. No fever, pt constantly runny nose that is clear mucus. No coughing, wheezing or SOB. [facility].Larita Fife request cb with Dr Royden Purl suggestion.

## 2013-10-05 NOTE — Telephone Encounter (Signed)
Pt's daughter advise of Dr. Royden Purl comments

## 2013-10-16 DIAGNOSIS — M5137 Other intervertebral disc degeneration, lumbosacral region: Secondary | ICD-10-CM | POA: Diagnosis not present

## 2014-01-17 ENCOUNTER — Encounter: Payer: Self-pay | Admitting: Family Medicine

## 2014-01-17 ENCOUNTER — Ambulatory Visit (INDEPENDENT_AMBULATORY_CARE_PROVIDER_SITE_OTHER): Payer: MEDICARE | Admitting: Family Medicine

## 2014-01-17 VITALS — BP 110/72 | HR 66 | Temp 98.3°F | Ht 63.0 in | Wt 156.8 lb

## 2014-01-17 DIAGNOSIS — H6120 Impacted cerumen, unspecified ear: Secondary | ICD-10-CM | POA: Diagnosis not present

## 2014-01-17 DIAGNOSIS — H6123 Impacted cerumen, bilateral: Secondary | ICD-10-CM | POA: Insufficient documentation

## 2014-01-17 NOTE — Progress Notes (Signed)
Pre visit review using our clinic review tool, if applicable. No additional management support is needed unless otherwise documented below in the visit note. 

## 2014-01-17 NOTE — Progress Notes (Signed)
Subjective:    Patient ID: Michelle Gregory, female    DOB: Sep 15, 1923, 80 y.o.   MRN: [id number]  HPI Here with decreased hearing in L ear  Took some meclizine but not dizzy  No drainage or ear pain  No cold symptoms  ? Wheezing last night    R ear does not hear anyway   Patient Active Problem List   Diagnosis Date Noted  . Excessive cerumen in both ear canals 01/17/2014  . Encounter for Medicare annual wellness exam 03/21/2013  . Gout 09/19/2012  . Dysuria 03/15/2012  . Osteopenia 05/05/2011  . Degenerative lumbar disc 01/20/2011  . VARICOSE VEINS, LOWER EXTREMITIES 09/04/2010  . HYPERGLYCEMIA, FASTING 01/08/2010  . HYPERLIPIDEMIA 05/09/2008  . GOUT 05/09/2008  . ANXIETY 05/09/2008  . DEPRESSION 05/09/2008  . HYPERTENSION 05/09/2008  . HEMORRHOIDS, INTERNAL 05/09/2008  . ALLERGIC RHINITIS 05/09/2008  . REACTIVE AIRWAY DISEASE 05/09/2008  . GERD 05/09/2008  . IBS 05/09/2008  . OSTEOARTHRITIS 05/09/2008  . COLON CANCER, HX OF 05/09/2008   Past Medical History  Diagnosis Date  . Allergic rhinitis   . Anxiety   . History of colon cancer   . Depression   . GERD (gastroesophageal reflux disease)   . Hyperlipidemia   . Hypertension   . Osteoarthritis   . Vertigo   . Spinal compression fracture    Past Surgical History  Procedure Laterality Date  . Colon resection    . Appendectomy    . Cataract extraction    . Tonsillectomy     History  Substance Use Topics  . Smoking status: Never Smoker   . Smokeless tobacco: Not on file  . Alcohol Use: No   Family History  Problem Relation Age of Onset  . Prostate cancer Father   . Heart attack Mother   . Gout Mother    Allergies  Allergen Reactions  . Amoxicillin-Pot Clavulanate     REACTION: diarrhea   Current Outpatient Prescriptions on File Prior to Visit  Medication Sig Dispense Refill  . allopurinol (ZYLOPRIM) 100 MG tablet Take 2 tablets (=200 mg) by mouth daily.  180 tablet  1  . ALPRAZolam (XANAX) 0.5  MG tablet 1/2 pill by mouth up to twice daily as needed for anxiety  30 tablet  3  . Ascorbic Acid (VITAMIN C) 100 MG tablet Take 100 mg by mouth daily.      Marland Kitchen aspirin 81 MG tablet Take 81 mg by mouth daily.        . calcium-vitamin D (CALCIUM 500 +D) 500 MG tablet Take 1 tablet by mouth daily.       Marland Kitchen glucosamine-chondroitin 500-400 MG tablet Take 1 tablet by mouth daily.      Marland Kitchen HYDROcodone-acetaminophen (VICODIN) 5-500 MG per tablet Take 1 tablet by mouth every 6 (six) hours as needed.      Marland Kitchen losartan (COZAAR) 100 MG tablet Take 1 tablet (100 mg  total) by mouth daily.  90 tablet  1  . meclizine (ANTIVERT) 25 MG tablet Take 1 tablet (25 mg total) by mouth 3 (three) times daily as needed.  90 tablet  3  . metoprolol tartrate (LOPRESSOR) 25 MG tablet Take 1 tablet (25 mg total) by mouth 2 (two) times  daily.  180 tablet  1  . multivitamin (THERAGRAN) per tablet Take 1 tablet by mouth daily.        Marland Kitchen PARoxetine (PAXIL) 30 MG tablet Take 1 tablet (30 mg total) by mouth every morning.  90 tablet  1  . pregabalin (LYRICA) 50 MG capsule 1 tablet by mouth in AM and 2 tabs by mouth at night      . raloxifene (EVISTA) 60 MG tablet Take 1 tablet (60 mg total) by mouth daily.  30 tablet  11   No current facility-administered medications on file prior to visit.    Review of Systems    Review of Systems  Constitutional: Negative for fever, appetite change,  and unexpected weight change.  Eyes: Negative for pain and visual disturbance. ENt pos for ear pressure with reduced hearing but no drainage , neg for nasal symptoms   Respiratory: Negative for cough and shortness of breath.   Cardiovascular: Negative for cp or palpitations    Gastrointestinal: Negative for nausea, diarrhea and constipation.  Genitourinary: Negative for urgency and frequency.  Skin: Negative for pallor or rash   Neurological: Negative for weakness, light-headedness, numbness and headaches.  Hematological: Negative for adenopathy.  Does not bruise/bleed easily.  Psychiatric/Behavioral: Negative for dysphoric mood. The patient is not nervous/anxious.      Objective:   Physical Exam  Constitutional: She appears well-developed and well-nourished. No distress.  HENT:  Head: Normocephalic and atraumatic.  Nose: Nose normal.  Mouth/Throat: Oropharynx is clear and moist. No oropharyngeal exudate.  Pt very HOH -this improves after simple ear irrigation  bilat cerumen impaction S/p irrigation- clear canals and no erythema or swelling  Hearing improved     Eyes: Conjunctivae and EOM are normal. Pupils are equal, round, and reactive to light. Right eye exhibits no discharge.  Neck: Normal range of motion. Neck supple.  Cardiovascular: Normal rate and regular rhythm.   Pulmonary/Chest: Effort normal and breath sounds normal.  Lymphadenopathy:    She has no cervical adenopathy.  Neurological: She is alert.  Skin: Skin is warm and dry. No rash noted. No erythema. No pallor.  Psychiatric: She has a normal mood and affect.          Assessment & Plan:

## 2014-01-17 NOTE — Patient Instructions (Signed)
All the ear wax was irrigated out  You will have a watery feeling for a while If pain or any problems let me know

## 2014-01-18 NOTE — Assessment & Plan Note (Signed)
Resolved s/p simple irrigation  Pt tolerated it well  This process took over 30 minutes to complete (one on one time) Disc methods to prevent cerumen impaction in the future   Adv to call if problems with ear pain or drainage or dizziness

## 2014-03-28 ENCOUNTER — Encounter: Payer: Self-pay | Admitting: Family Medicine

## 2014-03-28 ENCOUNTER — Ambulatory Visit (INDEPENDENT_AMBULATORY_CARE_PROVIDER_SITE_OTHER): Payer: MEDICARE | Admitting: Family Medicine

## 2014-03-28 VITALS — BP 126/80 | HR 70 | Temp 98.2°F | Ht 62.5 in | Wt 156.0 lb

## 2014-03-28 DIAGNOSIS — M949 Disorder of cartilage, unspecified: Secondary | ICD-10-CM | POA: Diagnosis not present

## 2014-03-28 DIAGNOSIS — I10 Essential (primary) hypertension: Secondary | ICD-10-CM

## 2014-03-28 DIAGNOSIS — M899 Disorder of bone, unspecified: Secondary | ICD-10-CM

## 2014-03-28 DIAGNOSIS — B372 Candidiasis of skin and nail: Secondary | ICD-10-CM

## 2014-03-28 DIAGNOSIS — E785 Hyperlipidemia, unspecified: Secondary | ICD-10-CM

## 2014-03-28 DIAGNOSIS — M109 Gout, unspecified: Secondary | ICD-10-CM

## 2014-03-28 DIAGNOSIS — R7309 Other abnormal glucose: Secondary | ICD-10-CM

## 2014-03-28 DIAGNOSIS — Z23 Encounter for immunization: Secondary | ICD-10-CM

## 2014-03-28 DIAGNOSIS — F411 Generalized anxiety disorder: Secondary | ICD-10-CM

## 2014-03-28 DIAGNOSIS — M8580 Other specified disorders of bone density and structure, unspecified site: Secondary | ICD-10-CM

## 2014-03-28 DIAGNOSIS — Z0000 Encounter for general adult medical examination without abnormal findings: Secondary | ICD-10-CM | POA: Diagnosis not present

## 2014-03-28 LAB — URIC ACID: Uric Acid, Serum: 3.9 mg/dL (ref 2.4–7.0)

## 2014-03-28 LAB — COMPREHENSIVE METABOLIC PANEL
ALT: 13 U/L (ref 0–35)
AST: 25 U/L (ref 0–37)
Albumin: 4 g/dL (ref 3.5–5.2)
Alkaline Phosphatase: 45 U/L (ref 39–117)
BUN: 20 mg/dL (ref 6–23)
CO2: 30 mEq/L (ref 19–32)
Calcium: 9.4 mg/dL (ref 8.4–10.5)
Chloride: 100 mEq/L (ref 96–112)
Creatinine, Ser: 0.9 mg/dL (ref 0.4–1.2)
GFR: 61.08 mL/min (ref >60.00)
Glucose, Bld: 91 mg/dL (ref 70–99)
Potassium: 4.5 mEq/L (ref 3.5–5.1)
Sodium: 140 mEq/L (ref 135–145)
Total Bilirubin: 0.6 mg/dL (ref 0.2–1.2)
Total Protein: 6.4 g/dL (ref 6.0–8.3)

## 2014-03-28 LAB — CBC WITH DIFFERENTIAL/PLATELET
Basophils Absolute: 0 10*3/uL (ref 0.0–0.1)
Basophils Relative: 0.8 % (ref 0.0–3.0)
Eosinophils Absolute: 0.5 10*3/uL (ref 0.0–0.7)
Eosinophils Relative: 7.6 % — ABNORMAL HIGH (ref 0.0–5.0)
HCT: 41.1 % (ref 36.0–46.0)
Hemoglobin: 13.8 g/dL (ref 12.0–15.0)
Lymphocytes Relative: 24.9 % (ref 12.0–46.0)
Lymphs Abs: 1.5 10*3/uL (ref 0.7–4.0)
MCHC: 33.7 g/dL (ref 30.0–36.0)
MCV: 99.3 fl (ref 78.0–100.0)
Monocytes Absolute: 0.6 10*3/uL (ref 0.1–1.0)
Monocytes Relative: 9 % (ref 3.0–12.0)
Neutro Abs: 3.6 10*3/uL (ref 1.4–7.7)
Neutrophils Relative %: 57.7 % (ref 43.0–77.0)
Platelets: 183 10*3/uL (ref 150.0–400.0)
RBC: 4.14 Mil/uL (ref 3.87–5.11)
RDW: 13.8 % (ref 11.5–15.5)
WBC: 6.2 10*3/uL (ref 4.0–10.5)

## 2014-03-28 LAB — HEMOGLOBIN A1C: Hgb A1c MFr Bld: 5.6 % (ref 4.6–6.5)

## 2014-03-28 LAB — TSH: TSH: 0.37 u[IU]/mL (ref 0.35–4.50)

## 2014-03-28 LAB — LIPID PANEL
Cholesterol: 154 mg/dL (ref 0–200)
HDL: 31.9 mg/dL — ABNORMAL LOW (ref >39.00)
LDL Cholesterol: 88 mg/dL (ref 0–99)
Total CHOL/HDL Ratio: 5
Triglycerides: 172 mg/dL — ABNORMAL HIGH (ref 0.0–149.0)
VLDL: 34.4 mg/dL (ref 0.0–40.0)

## 2014-03-28 LAB — HM DEXA SCAN

## 2014-03-28 MED ORDER — ALPRAZOLAM 0.5 MG PO TABS: ORAL_TABLET | ORAL | Status: DC

## 2014-03-28 NOTE — Progress Notes (Signed)
Subjective:    Patient ID: Michelle Gregory, female    DOB: [birth year], 80 y.o.   MRN: [id number]  HPI I have personally reviewed the Medicare Annual Wellness questionnaire and have noted 1. The patient's medical and social history 2. Their use of alcohol, tobacco or illicit drugs 3. Their current medications and supplements 4. The patient's functional ability including ADL's, fall risks, home safety risks and hearing or visual             impairment. 5. Diet and physical activities 6. Evidence for depression or mood disorders  The patients weight, height, BMI have been recorded in the chart and visual acuity is per eye clinic.  I have made referrals, counseling and provided education to the patient based review of the above and I have provided the pt with a written personalized care plan for preventive services.  Feels good overall  Has a lot of chronic pain and she deals with it well - getting off some pain pills currently and she pushes herself when she has to    See scanned forms.  Routine anticipatory guidance given to patient.  See health maintenance. Colon cancer screening (hx of prev colon cancer)  12/03 - does not want any further colon screening  Breast cancer screening - declines mammograms  Self breast exam- no lumps or changes  Flu vaccine 10/14  Tetanus vaccine Td 1/09 Pneumovax 1/04 - is interested in prevnar  Zoster vaccine-she may be interested in the vaccine now   Advance directive has a living will set up with POA Cognitive function addressed- see scanned forms- and if abnormal then additional documentation follows. - good memory for her age -occ forgets names   PMH and SH reviewed  Meds, vitals, and allergies reviewed.   ROS: See HPI.  Otherwise negative.    She occasionally takes a stool softener She tends to strain with bm  She does use miralax occ as well  Hard to eat properly cooking for herself (still independent)  bp is stable today  No cp or  palpitations or headaches or edema  No side effects to medicines  BP Readings from Last 3 Encounters:  03/28/14 126/80  01/17/14 110/72  03/21/13 130/86     Osteopenia  dexa 8/12  - she declines getting another one  evista - when she can afford it /when in donut hole  Ca and D- takes it every day   Hyperlipidemia  Lab Results  Component Value Date   CHOL 169 03/21/2013   CHOL 158 09/19/2012   CHOL 142 03/15/2012   Lab Results  Component Value Date   HDL 33.00* 03/21/2013   HDL 30.90* 09/19/2012   HDL 37.50* 03/15/2012   Lab Results  Component Value Date   LDLCALC 100* 03/21/2013   LDLCALC 90 09/19/2012   LDLCALC 80 03/15/2012   Lab Results  Component Value Date   TRIG 178.0* 03/21/2013   TRIG 185.0* 09/19/2012   TRIG 121.0 03/15/2012   Lab Results  Component Value Date   CHOLHDL 5 03/21/2013   CHOLHDL 5 09/19/2012   CHOLHDL 4 03/15/2012   Lab Results  Component Value Date   LDLDIRECT 113.7 07/08/2010   LDLDIRECT 98.6 01/06/2010   LDLDIRECT 112.5 07/10/2009     Hx of gout   Needs labs today   Watches sugar in diet for hyperglycemia Lab Results  Component Value Date   HGBA1C 5.6 03/21/2013     Patient Active Problem List   Diagnosis Date  Noted  . Candidal intertrigo 03/28/2014  . Excessive cerumen in both ear canals 01/17/2014  . Encounter for Medicare annual wellness exam 03/21/2013  . Gout 09/19/2012  . Dysuria 03/15/2012  . Osteopenia 05/05/2011  . Degenerative lumbar disc 01/20/2011  . VARICOSE VEINS, LOWER EXTREMITIES 09/04/2010  . HYPERGLYCEMIA, FASTING 01/08/2010  . HYPERLIPIDEMIA 05/09/2008  . GOUT 05/09/2008  . ANXIETY 05/09/2008  . DEPRESSION 05/09/2008  . HYPERTENSION 05/09/2008  . HEMORRHOIDS, INTERNAL 05/09/2008  . ALLERGIC RHINITIS 05/09/2008  . REACTIVE AIRWAY DISEASE 05/09/2008  . GERD 05/09/2008  . IBS 05/09/2008  . OSTEOARTHRITIS 05/09/2008  . COLON CANCER, HX OF 05/09/2008   Past Medical History  Diagnosis Date  . Allergic  rhinitis   . Anxiety   . History of colon cancer   . Depression   . GERD (gastroesophageal reflux disease)   . Hyperlipidemia   . Hypertension   . Osteoarthritis   . Vertigo   . Spinal compression fracture    Past Surgical History  Procedure Laterality Date  . Colon resection    . Appendectomy    . Cataract extraction    . Tonsillectomy     History  Substance Use Topics  . Smoking status: Never Smoker   . Smokeless tobacco: Not on file  . Alcohol Use: No   Family History  Problem Relation Age of Onset  . Prostate cancer Father   . Heart attack Mother   . Gout Mother    Allergies  Allergen Reactions  . Amoxicillin-Pot Clavulanate     REACTION: diarrhea   Current Outpatient Prescriptions on File Prior to Visit  Medication Sig Dispense Refill  . allopurinol (ZYLOPRIM) 100 MG tablet Take 2 tablets (=200 mg) by mouth daily.  180 tablet  1  . Ascorbic Acid (VITAMIN C) 100 MG tablet Take 100 mg by mouth daily.      Marland Kitchen aspirin 81 MG tablet Take 81 mg by mouth daily.        . calcium-vitamin D (CALCIUM 500 +D) 500 MG tablet Take 1 tablet by mouth daily.       Marland Kitchen glucosamine-chondroitin 500-400 MG tablet Take 1 tablet by mouth daily.      Marland Kitchen HYDROcodone-acetaminophen (VICODIN) 5-500 MG per tablet Take 1 tablet by mouth every 6 (six) hours as needed.      Marland Kitchen losartan (COZAAR) 100 MG tablet Take 1 tablet (100 mg  total) by mouth daily.  90 tablet  1  . meclizine (ANTIVERT) 25 MG tablet Take 1 tablet (25 mg total) by mouth 3 (three) times daily as needed.  90 tablet  3  . metoprolol tartrate (LOPRESSOR) 25 MG tablet Take 1 tablet (25 mg total) by mouth 2 (two) times  daily.  180 tablet  1  . multivitamin (THERAGRAN) per tablet Take 1 tablet by mouth daily.        . Omega-3 Fatty Acids (FISH OIL PO) Take 1 capsule by mouth daily.      Marland Kitchen PARoxetine (PAXIL) 30 MG tablet Take 1 tablet (30 mg total) by mouth every morning.  90 tablet  1  . pregabalin (LYRICA) 50 MG capsule 1 tablet by  mouth in AM and 2 tabs by mouth at night      . raloxifene (EVISTA) 60 MG tablet Take 1 tablet (60 mg total) by mouth daily.  30 tablet  11   No current facility-administered medications on file prior to visit.    Review of Systems    Review of Systems  Constitutional: Negative for fever, appetite change, fatigue and unexpected weight change.  Eyes: Negative for pain and visual disturbance.  Respiratory: Negative for cough and shortness of breath.   Cardiovascular: Negative for cp or palpitations    Gastrointestinal: Negative for nausea, diarrhea and constipation.  Genitourinary: Negative for urgency and frequency.  Skin: Negative for pallor or rash   MSK pos for chronic back pain/aches and pains  Neurological: Negative for weakness, light-headedness, numbness and headaches.  Hematological: Negative for adenopathy. Does not bruise/bleed easily.  Psychiatric/Behavioral: Negative for dysphoric mood. The patient is not nervous/anxious.  pos for occas insomnia -takes xanax when needed with caution    Objective:   Physical Exam  Constitutional: She appears well-developed and well-nourished. No distress.  overwt and well appearing   HENT:  Head: Normocephalic and atraumatic.  Right Ear: External ear normal.  Left Ear: External ear normal.  Nose: Nose normal.  Mouth/Throat: Oropharynx is clear and moist.  Eyes: Conjunctivae and EOM are normal. Pupils are equal, round, and reactive to light. Right eye exhibits no discharge. Left eye exhibits no discharge. No scleral icterus.  Neck: Normal range of motion. Neck supple. No JVD present. No thyromegaly present.  Cardiovascular: Normal rate, regular rhythm, normal heart sounds and intact distal pulses.  Exam reveals no gallop.   Pulmonary/Chest: Effort normal and breath sounds normal. No respiratory distress. She has no wheezes. She has no rales.  Abdominal: Soft. Bowel sounds are normal. She exhibits no distension and no mass. There is no  tenderness.  Genitourinary:  Declines breast exam  Musculoskeletal: She exhibits no edema and no tenderness.  Lymphadenopathy:    She has no cervical adenopathy.  Neurological: She is alert. She has normal reflexes. No cranial nerve deficit. She exhibits normal muscle tone. Coordination normal.  Skin: Skin is warm and dry. Rash noted. No erythema. No pallor.  Erythema with sharp demarcation under pannus with a few satellite lesions   Psychiatric: She has a normal mood and affect.          Assessment & Plan:

## 2014-03-28 NOTE — Progress Notes (Signed)
Pre visit review using our clinic review tool, if applicable. No additional management support is needed unless otherwise documented below in the visit note. 

## 2014-03-28 NOTE — Patient Instructions (Signed)
prevnar vaccine today  If you are interested in a shingles/zoster vaccine - call your insurance to check on coverage,( you should not get it within 1 month of other vaccines) , then call us for a prescription  for it to take to a pharmacy that gives the shot , or make a nurse visit to get it here depending on your coverage Stay active and busy and social  Take care of yourself  Labs today

## 2014-03-29 ENCOUNTER — Encounter: Payer: Self-pay | Admitting: *Deleted

## 2014-03-29 LAB — VITAMIN D 25 HYDROXY (VIT D DEFICIENCY, FRACTURES): Vit D, 25-Hydroxy: 56 ng/mL (ref 30–89)

## 2014-03-29 NOTE — Assessment & Plan Note (Signed)
Uric acid today No problems while on allopurinol

## 2014-03-29 NOTE — Assessment & Plan Note (Signed)
Mild - occ itchy under pannus  inst to try antifungal powder otc as directed and update if not improving

## 2014-03-29 NOTE — Assessment & Plan Note (Signed)
a1c today Diet is fair  No symptoms

## 2014-03-29 NOTE — Assessment & Plan Note (Signed)
Disc goals for lipids and reasons to control them Rev labs with pt from last draw- will re check today Rev low sat fat diet in detail

## 2014-03-29 NOTE — Assessment & Plan Note (Signed)
Pt declines further dexa Check D level No fractures  Disc need for calcium/ vitamin D/ wt bearing exercise and bone density test every 2 y to monitor Disc safety/ fracture risk in detail

## 2014-03-29 NOTE — Assessment & Plan Note (Signed)
Overall quite stable and well controlled  No change in tx

## 2014-03-29 NOTE — Assessment & Plan Note (Signed)
bp in fair control at this time  BP Readings from Last 1 Encounters:  03/28/14 126/80   No changes needed Disc lifstyle change with low sodium diet and exercise  Lab today

## 2014-03-29 NOTE — Assessment & Plan Note (Signed)
Reviewed health habits including diet and exercise and skin cancer prevention Reviewed appropriate screening tests for age  Also reviewed health mt list, fam hx and immunization status , as well as social and family history   See HPI prevnar vaccine today Pt declines most cancer screening and dexa

## 2014-04-04 ENCOUNTER — Other Ambulatory Visit: Payer: Self-pay | Admitting: Family Medicine

## 2014-04-16 ENCOUNTER — Other Ambulatory Visit: Payer: Self-pay | Admitting: Family Medicine

## 2014-04-25 DIAGNOSIS — Z79899 Other long term (current) drug therapy: Secondary | ICD-10-CM | POA: Diagnosis not present

## 2014-04-25 DIAGNOSIS — G894 Chronic pain syndrome: Secondary | ICD-10-CM | POA: Diagnosis not present

## 2014-04-25 DIAGNOSIS — M5137 Other intervertebral disc degeneration, lumbosacral region: Secondary | ICD-10-CM | POA: Diagnosis not present

## 2014-04-25 DIAGNOSIS — M545 Low back pain, unspecified: Secondary | ICD-10-CM | POA: Diagnosis not present

## 2014-05-09 ENCOUNTER — Ambulatory Visit (INDEPENDENT_AMBULATORY_CARE_PROVIDER_SITE_OTHER): Payer: MEDICARE | Admitting: Family Medicine

## 2014-05-09 ENCOUNTER — Telehealth: Payer: Self-pay | Admitting: Family Medicine

## 2014-05-09 ENCOUNTER — Encounter: Payer: Self-pay | Admitting: Family Medicine

## 2014-05-09 VITALS — BP 100/70 | HR 84 | Temp 98.0°F | Ht 62.5 in | Wt 154.0 lb

## 2014-05-09 DIAGNOSIS — N390 Urinary tract infection, site not specified: Secondary | ICD-10-CM | POA: Diagnosis not present

## 2014-05-09 DIAGNOSIS — R300 Dysuria: Secondary | ICD-10-CM | POA: Diagnosis not present

## 2014-05-09 LAB — POCT URINALYSIS DIPSTICK
Glucose, UA: NEGATIVE
Ketones, UA: NEGATIVE
Nitrite, UA: NEGATIVE
Spec Grav, UA: 1.015
Urobilinogen, UA: 0.2
pH, UA: 6

## 2014-05-09 MED ORDER — CIPROFLOXACIN HCL 250 MG PO TABS: 250.0000 mg | ORAL_TABLET | Freq: Two times a day (BID) | ORAL | Status: DC

## 2014-05-09 NOTE — Patient Instructions (Signed)
You have a urinary tract infection  Take the cipro as directed  If any problems let me know  Keep drinking water  Alert me if any symptoms worsen We will update you with a urine culture result when it returns

## 2014-05-09 NOTE — Telephone Encounter (Signed)
I will see her then - if we have a cancellation earlier please move her up-thanks

## 2014-05-09 NOTE — Telephone Encounter (Signed)
Michelle Gregory had tried to schedule pt earlier when the 11:30 am came open but pt could not get to office in that amt of time; If another available appt does come open either Ward or I will change appt. Thank you.

## 2014-05-09 NOTE — Progress Notes (Signed)
Subjective:    Patient ID: Michelle Gregory, female    DOB: 1923-05-12, 80 y.o.   MRN: [id number]  HPI Here for uti symptom   May have started Sunday night -vomited times one (did not know why) Then worried she may be dehydrated  Increased fluids- improved briefly   Hot and sweating  Confused more than usual  maliase  Likely low grade fever last night - thermometer did not work , some chills (perhaps this am) Some frequency - she thought from water intake  Dysuria No blood in urine   Has not had a uti in 2 years   Results for orders placed in visit on 05/09/14  POCT URINALYSIS DIPSTICK      Result Value Ref Range   Color, UA yellow     Clarity, UA cloudy     Glucose, UA negative     Bilirubin, UA small     Ketones, UA negative     Spec Grav, UA 1.015     Blood, UA large     pH, UA 6.0     Protein, UA 2+     Urobilinogen, UA 0.2     Nitrite, UA negative     Leukocytes, UA large (3+)       Patient Active Problem List   Diagnosis Date Noted  . UTI (urinary tract infection) 05/09/2014  . Candidal intertrigo 03/28/2014  . Excessive cerumen in both ear canals 01/17/2014  . Encounter for Medicare annual wellness exam 03/21/2013  . Gout 09/19/2012  . Osteopenia 05/05/2011  . Degenerative lumbar disc 01/20/2011  . VARICOSE VEINS, LOWER EXTREMITIES 09/04/2010  . HYPERGLYCEMIA, FASTING 01/08/2010  . HYPERLIPIDEMIA 05/09/2008  . GOUT 05/09/2008  . ANXIETY 05/09/2008  . HYPERTENSION 05/09/2008  . HEMORRHOIDS, INTERNAL 05/09/2008  . ALLERGIC RHINITIS 05/09/2008  . REACTIVE AIRWAY DISEASE 05/09/2008  . GERD 05/09/2008  . IBS 05/09/2008  . OSTEOARTHRITIS 05/09/2008  . COLON CANCER, HX OF 05/09/2008   Past Medical History  Diagnosis Date  . Allergic rhinitis   . Anxiety   . History of colon cancer   . Depression   . GERD (gastroesophageal reflux disease)   . Hyperlipidemia   . Hypertension   . Osteoarthritis   . Vertigo   . Spinal compression fracture    Past  Surgical History  Procedure Laterality Date  . Colon resection    . Appendectomy    . Cataract extraction    . Tonsillectomy     History  Substance Use Topics  . Smoking status: Never Smoker   . Smokeless tobacco: Never Used  . Alcohol Use: No   Family History  Problem Relation Age of Onset  . Prostate cancer Father   . Heart attack Mother   . Gout Mother    Allergies  Allergen Reactions  . Amoxicillin-Pot Clavulanate     REACTION: diarrhea   Current Outpatient Prescriptions on File Prior to Visit  Medication Sig Dispense Refill  . allopurinol (ZYLOPRIM) 100 MG tablet Take 2 tablets by mouth  daily  180 tablet  3  . ALPRAZolam (XANAX) 0.5 MG tablet 1/2 pill by mouth up to twice daily as needed for anxiety  30 tablet  1  . Ascorbic Acid (VITAMIN C) 100 MG tablet Take 100 mg by mouth daily.      Marland Kitchen aspirin 81 MG tablet Take 81 mg by mouth daily.        Marland Kitchen BIOTIN PO Take 1 tablet by mouth daily.      Marland Kitchen  calcium-vitamin D (CALCIUM 500 +D) 500 MG tablet Take 1 tablet by mouth daily.       Mariane Baumgarten Calcium (STOOL SOFTENER PO) Take 1 tablet by mouth daily.      Marland Kitchen glucosamine-chondroitin 500-400 MG tablet Take 1 tablet by mouth daily.      Marland Kitchen HYDROcodone-acetaminophen (VICODIN) 5-500 MG per tablet Take 1 tablet by mouth every 6 (six) hours as needed.      Marland Kitchen losartan (COZAAR) 100 MG tablet Take 1 tablet by mouth  daily  90 tablet  3  . meclizine (ANTIVERT) 25 MG tablet Take 1 tablet (25 mg total) by mouth 3 (three) times daily as needed.  90 tablet  3  . metoprolol tartrate (LOPRESSOR) 25 MG tablet Take 1 tablet by mouth  twice a day  180 tablet  3  . multivitamin (THERAGRAN) per tablet Take 1 tablet by mouth daily.        . Omega-3 Fatty Acids (FISH OIL PO) Take 1 capsule by mouth daily.      Marland Kitchen PARoxetine (PAXIL) 30 MG tablet Take 1 tablet by mouth  every morning  90 tablet  3  . pregabalin (LYRICA) 50 MG capsule 1 tablet by mouth in AM and 2 tabs by mouth at night      . raloxifene  (EVISTA) 60 MG tablet TAKE 1 TABLET BY MOUTH DAILY.  30 tablet  5   No current facility-administered medications on file prior to visit.    Review of Systems Review of Systems  Constitutional: Negative for , appetite change, fatigue and unexpected weight change.  Eyes: Negative for pain and visual disturbance.  Respiratory: Negative for cough and shortness of breath.   Cardiovascular: Negative for cp or palpitations    Gastrointestinal: Negative for nausea, diarrhea and constipation.  Genitourinary: pos  for urgency and frequency. neg for flank pain or hematuria  Skin: Negative for pallor or rash   Neurological: Negative for weakness, light-headedness, numbness and headaches.  Hematological: Negative for adenopathy. Does not bruise/bleed easily.  Psychiatric/Behavioral: Negative for dysphoric mood. The patient is not nervous/anxious.         Objective:   Physical Exam  Constitutional: She appears well-developed and well-nourished. No distress.  HENT:  Head: Normocephalic and atraumatic.  Mouth/Throat: Oropharynx is clear and moist.  Eyes: Conjunctivae and EOM are normal. Pupils are equal, round, and reactive to light. No scleral icterus.  Cardiovascular: Normal rate and regular rhythm.   Pulmonary/Chest: Effort normal and breath sounds normal.  Abdominal: Soft. Bowel sounds are normal. She exhibits no distension and no mass. There is tenderness. There is no rebound and no guarding.  Mild suprapubic tenderness No cva tenderness   Neurological: She is alert.  Skin: Skin is warm and dry. No rash noted. No erythema.  Psychiatric: She has a normal mood and affect.          Assessment & Plan:   Problem List Items Addressed This Visit     Genitourinary   UTI (urinary tract infection)     Treat with cipro  Disc water intake  Update if not starting to improve in a week or if worsening  At al (fever/nausea or confusion) cx pend    Relevant Orders      Urine culture      Other Visit Diagnoses   Dysuria    -  Primary    Relevant Orders       POCT Urinalysis Dipstick (Completed)

## 2014-05-09 NOTE — Telephone Encounter (Signed)
Patient Information:  Caller Name: [name]  Phone: [phone]  Patient: Michelle Gregory, Michelle Gregory  Gender: Female  DOB: 06/08/1923  Age: 80 Years  PCP: Loura Pardon [facility])  Office Follow Up:  Does the office need to follow up with this patient?: No  Instructions For The Office: N/A  RN Note:  Afebrile. Onset yesterday, 05/08/2014 shaking, chills, feels like low grade temperature (no thermometer) and confused intermittently (she is having normal conversations and then will think someone is talking that is not there). [employer] office and [employer] are already full today, [name] daughter does not know how to get to the [place] office, far from her house. RN/CAN called office for advice on next steps, either daughter can bring in urine sample or go to the Urgent Care. Larene Beach at the office transferred to the triage nurse, got answering machine and RN/CAN transferred back and spoke with Joseph Art, nurse who states  Dr. will work her in @ 18:00 today, 05/09/2014. [name] agreed and RN/CAN advised increasing fluids and fall precautions until then. Staff nurse, Renea states to go to Urgent Care if any worse. [name] agreed.  Symptoms  Reason For Call & Symptoms: Shaking, low grade fever, confused and decreased drinking.  Reviewed Health History In EMR: Yes  Reviewed Medications In EMR: Yes  Reviewed Allergies In EMR: Yes  Reviewed Surgeries / Procedures: Yes  Date of Onset of Symptoms: 05/08/2014  Treatments Tried: Pushing fluids  Treatments Tried Worked: No  Guideline(s) Used:  Urination Pain - Female  Disposition Per Guideline:   See Today in Office  Reason For Disposition Reached:   Age > 50 years  Advice Given:  Fluids:   Drink extra fluids. Drink 8-10 glasses of liquids a day (Reason: to produce a dilute, non-irritating urine).  Cranberry Juice:   Caution: Do not drink more than 16 oz (480 ml). Here is the reason: too much cranberry juice can also be irritating to the bladder.  Call Back If:  You become worse.  Patient Will Follow Care Advice:  YES

## 2014-05-09 NOTE — Telephone Encounter (Signed)
Jonelle Sidle CAN said pt having intermittent confusion (more out of sorts according to lynn pts daughter),chills, and pain upon urination. No available appts at [facility] or [place]. Dr Glori Bickers said if out of sorts rather than really confused can add on pt at 6 PM. [name] said if pt condition changes or worsens prior to appt pt will go to UC.

## 2014-05-09 NOTE — Progress Notes (Signed)
Pre visit review using our clinic review tool, if applicable. No additional management support is needed unless otherwise documented below in the visit note. 

## 2014-05-10 NOTE — Assessment & Plan Note (Signed)
Treat with cipro  Disc water intake  Update if not starting to improve in a week or if worsening  At al (fever/nausea or confusion) cx pend

## 2014-05-11 LAB — URINE CULTURE: Colony Count: >=100000

## 2014-05-21 ENCOUNTER — Other Ambulatory Visit (INDEPENDENT_AMBULATORY_CARE_PROVIDER_SITE_OTHER): Payer: MEDICARE

## 2014-05-21 ENCOUNTER — Telehealth: Payer: Self-pay | Admitting: Family Medicine

## 2014-05-21 DIAGNOSIS — N390 Urinary tract infection, site not specified: Secondary | ICD-10-CM

## 2014-05-21 NOTE — Telephone Encounter (Signed)
Pt daughter [name] requesting to bring pt back just to sample her urine to see if UTI is all cleared up. Please advise. Ok to leave message on [place] home phone.

## 2014-05-21 NOTE — Telephone Encounter (Signed)
appt scheduled and pt's daughter notified

## 2014-05-21 NOTE — Telephone Encounter (Signed)
Tell her to leave a ua please - if negative dip nothing more to do - if pos dip let me know and I will order cx  Thanks

## 2014-05-22 ENCOUNTER — Telehealth: Payer: Self-pay

## 2014-05-22 DIAGNOSIS — N390 Urinary tract infection, site not specified: Secondary | ICD-10-CM | POA: Diagnosis not present

## 2014-05-22 LAB — POCT URINALYSIS DIPSTICK
Bilirubin, UA: NEGATIVE
Blood, UA: NEGATIVE
Glucose, UA: NEGATIVE
Ketones, UA: NEGATIVE
Nitrite, UA: NEGATIVE
Protein, UA: NEGATIVE
Spec Grav, UA: 1.01
Urobilinogen, UA: 0.2
pH, UA: 6

## 2014-05-22 NOTE — Telephone Encounter (Signed)
V/m left requesting cb about urine specimen left on 05/21/14.

## 2014-05-22 NOTE — Telephone Encounter (Signed)
She still has some wbc in urine so we are sending for a cx to make sure infection is clear

## 2014-05-22 NOTE — Telephone Encounter (Signed)
Michelle Gregory pts daughter called and notified as instructed; Michelle Gregory request when culture results are available to call Michelle Gregory at [phone].

## 2014-05-22 NOTE — Telephone Encounter (Signed)
Pt notified of Dr. Marliss Coots comments and advise her we will call back once the urine cx come back

## 2014-05-23 LAB — URINE CULTURE
Colony Count: NO GROWTH
Organism ID, Bacteria: NO GROWTH

## 2014-08-13 ENCOUNTER — Telehealth: Payer: Self-pay

## 2014-08-13 NOTE — Telephone Encounter (Signed)
Michelle Gregory pts daughter said pt had episode on 08/10/14 about 8:30 PM; pt was nauseated and constipated. Michelle Gregory went to see pt on Fri night; pt did not have constipation, pt was not throwing up but was gagging; about 2 AM Michelle Gregory gave pt alprazolam and pt did rest all night. On 08/12/14 pt would not eat because pt was afraid it would make her sick on her stomach;finally pt ate a good supper last night and did not get nauseated or gag. Michelle Gregory is not sure if pt is truly sick on stomach or if it is in pts mind that she is nauseated. Pt takes miralax daily because pt is afraid of getting constipated. Michelle Gregory said pt also concerned about external hemorrhoids; no bleeding.Michelle Gregory not sure what to do. [facility].

## 2014-08-13 NOTE — Telephone Encounter (Signed)
Pt's daughter notified.

## 2014-08-13 NOTE — Telephone Encounter (Signed)
Bring her in for an appointment if symptoms do not improve

## 2014-10-09 DIAGNOSIS — Z79891 Long term (current) use of opiate analgesic: Secondary | ICD-10-CM | POA: Diagnosis not present

## 2014-10-09 DIAGNOSIS — G894 Chronic pain syndrome: Secondary | ICD-10-CM | POA: Diagnosis not present

## 2014-10-09 DIAGNOSIS — M5136 Other intervertebral disc degeneration, lumbar region: Secondary | ICD-10-CM | POA: Diagnosis not present

## 2014-11-09 ENCOUNTER — Other Ambulatory Visit: Payer: Self-pay | Admitting: Family Medicine

## 2014-12-04 ENCOUNTER — Telehealth: Payer: Self-pay | Admitting: *Deleted

## 2014-12-04 NOTE — Telephone Encounter (Signed)
Spoken to patient's daughter and inform her of Dr Marliss Coots comments.

## 2014-12-04 NOTE — Telephone Encounter (Signed)
mucinex (plain) is my favorite Encourage fluids F/u if no improvement

## 2014-12-04 NOTE — Telephone Encounter (Signed)
Patient's daughter called and stated that patient has some congestion but no fever, no chills. She wants to know what kind of otc can she give to the patient. She is concern due to patient's age and on lots of medication. Please advise. Thank you.

## 2015-03-19 ENCOUNTER — Encounter: Payer: Self-pay | Admitting: Family Medicine

## 2015-03-19 ENCOUNTER — Telehealth: Payer: Self-pay | Admitting: Family Medicine

## 2015-03-19 ENCOUNTER — Ambulatory Visit (INDEPENDENT_AMBULATORY_CARE_PROVIDER_SITE_OTHER): Payer: MEDICARE | Admitting: Family Medicine

## 2015-03-19 VITALS — BP 136/86 | HR 66 | Temp 97.5°F | Ht 62.5 in | Wt 155.5 lb

## 2015-03-19 DIAGNOSIS — R4182 Altered mental status, unspecified: Secondary | ICD-10-CM

## 2015-03-19 DIAGNOSIS — F411 Generalized anxiety disorder: Secondary | ICD-10-CM | POA: Diagnosis not present

## 2015-03-19 DIAGNOSIS — N390 Urinary tract infection, site not specified: Secondary | ICD-10-CM | POA: Diagnosis not present

## 2015-03-19 DIAGNOSIS — B9789 Other viral agents as the cause of diseases classified elsewhere: Secondary | ICD-10-CM

## 2015-03-19 DIAGNOSIS — J069 Acute upper respiratory infection, unspecified: Secondary | ICD-10-CM | POA: Diagnosis not present

## 2015-03-19 LAB — POCT URINALYSIS DIPSTICK
Bilirubin, UA: NEGATIVE
Glucose, UA: NEGATIVE
Ketones, UA: NEGATIVE
Nitrite, UA: NEGATIVE
Spec Grav, UA: 1.015
Urobilinogen, UA: 0.2
pH, UA: 6

## 2015-03-19 MED ORDER — ALPRAZOLAM 0.5 MG PO TABS: ORAL_TABLET | ORAL | Status: DC

## 2015-03-19 MED ORDER — CIPROFLOXACIN HCL 250 MG PO TABS: 250.0000 mg | ORAL_TABLET | Freq: Two times a day (BID) | ORAL | Status: DC

## 2015-03-19 NOTE — Assessment & Plan Note (Signed)
Uri vs allergies- with reassuring exam and imp with mucinex  No wheezing today  Will continue expectorant prn and update if symptoms worsen

## 2015-03-19 NOTE — Assessment & Plan Note (Signed)
Refilled xanax to use with caution

## 2015-03-19 NOTE — Telephone Encounter (Signed)
Patient Name: Michelle Gregory  DOB: 1922-11-07    Initial Comment caller states her mother is sleeping too much - going to sleep while sitting up - not acting right - seems confused thinks its morning when it is at night   Nurse Assessment  Nurse: Leilani Merl, RN, Nira Conn Date/Time (Eastern Time): 03/19/2015 8:40:08 AM  Confirm and document reason for call. If symptomatic, describe symptoms. ---caller states her mother is sleeping too much - going to sleep while sitting up - not acting right - seems confused thinks its morning when it is at night, after she takes her afternoon nap. This started about 5 days ago  Has the patient traveled out of the country within the last 30 days? ---Not Applicable  Does the patient require triage? ---Yes  Related visit to physician within the last 2 weeks? ---No  Does the PT have any chronic conditions? (i.e. diabetes, asthma, etc.) ---Yes  List chronic conditions. ---history of dehydration and UTI     Guidelines    Guideline Title Affirmed Question Affirmed Notes  Confusion - Delirium [1] Acting confused (e.g., disoriented, slurred speech) AND [2] transient (completely resolved)    Final Disposition User   See Physician within 4 Hours (or PCP triage) Standifer, RN, Heather    Comments  Appt. made for today at 12:30 pm with Dr. Glori Bickers.

## 2015-03-19 NOTE — Progress Notes (Signed)
Pre visit review using our clinic review tool, if applicable. No additional management support is needed unless otherwise documented below in the visit note. 

## 2015-03-19 NOTE — Telephone Encounter (Signed)
Pt has appt 03/19/15 at 12:30pm with Dr Glori Bickers.

## 2015-03-19 NOTE — Assessment & Plan Note (Signed)
Pos ua and some fatigue/ MS change Cover with cipro  Enc water - suspect she is borderline dehydrated -rev this in detail  Pend cx  Update if not starting to improve in a week or if worsening

## 2015-03-19 NOTE — Progress Notes (Signed)
Subjective:    Patient ID: Michelle Gregory, female    DOB: 11/11/1922, 81 y.o.   MRN: [id number]  HPI Here for several symptoms   Has been feeling poorly  More fatigue and sleeping more  -falls asleep at 5-7 pm  This is unusual   Some congestion and wheezing  Bought mucinex plain - helped yesterday  Not a lot of production until yesterday-clear  Nasal drainage is very thick and white  Had one headache -better now , no sinus pressure   Not drinking enough water   Some mental confusion - just recently  Family started to note it on Sunday   ua  Results for orders placed or performed in visit on 03/19/15  POCT urinalysis dipstick  Result Value Ref Range   Color, UA Yellow    Clarity, UA Hazy    Glucose, UA Neg.    Bilirubin, UA Neg.    Ketones, UA Neg.    Spec Grav, UA 1.015    Blood, UA Trace    pH, UA 6.0    Protein, UA Trace    Urobilinogen, UA 0.2    Nitrite, UA Neg.    Leukocytes, UA large (3+)     Patient Active Problem List   Diagnosis Date Noted  . UTI (urinary tract infection) 05/09/2014  . Candidal intertrigo 03/28/2014  . Excessive cerumen in both ear canals 01/17/2014  . Encounter for Medicare annual wellness exam 03/21/2013  . Gout 09/19/2012  . Osteopenia 05/05/2011  . Degenerative lumbar disc 01/20/2011  . VARICOSE VEINS, LOWER EXTREMITIES 09/04/2010  . HYPERGLYCEMIA, FASTING 01/08/2010  . HYPERLIPIDEMIA 05/09/2008  . GOUT 05/09/2008  . ANXIETY 05/09/2008  . HYPERTENSION 05/09/2008  . HEMORRHOIDS, INTERNAL 05/09/2008  . ALLERGIC RHINITIS 05/09/2008  . REACTIVE AIRWAY DISEASE 05/09/2008  . GERD 05/09/2008  . IBS 05/09/2008  . OSTEOARTHRITIS 05/09/2008  . COLON CANCER, HX OF 05/09/2008   Past Medical History  Diagnosis Date  . Allergic rhinitis   . Anxiety   . History of colon cancer   . Depression   . GERD (gastroesophageal reflux disease)   . Hyperlipidemia   . Hypertension   . Osteoarthritis   . Vertigo   . Spinal compression  fracture    Past Surgical History  Procedure Laterality Date  . Colon resection    . Appendectomy    . Cataract extraction    . Tonsillectomy     History  Substance Use Topics  . Smoking status: Never Smoker   . Smokeless tobacco: Never Used  . Alcohol Use: No   Family History  Problem Relation Age of Onset  . Prostate cancer Father   . Heart attack Mother   . Gout Mother    Allergies  Allergen Reactions  . Amoxicillin-Pot Clavulanate     REACTION: diarrhea   Current Outpatient Prescriptions on File Prior to Visit  Medication Sig Dispense Refill  . allopurinol (ZYLOPRIM) 100 MG tablet Take 2 tablets by mouth  daily 180 tablet 3  . ALPRAZolam (XANAX) 0.5 MG tablet 1/2 pill by mouth up to twice daily as needed for anxiety 30 tablet 1  . Ascorbic Acid (VITAMIN C) 100 MG tablet Take 100 mg by mouth daily.    Marland Kitchen aspirin 81 MG tablet Take 81 mg by mouth daily.      Marland Kitchen BIOTIN PO Take 1 tablet by mouth daily.    . calcium-vitamin D (CALCIUM 500 +D) 500 MG tablet Take 1 tablet by mouth daily.     Marland Kitchen  Docusate Calcium (STOOL SOFTENER PO) Take 1 tablet by mouth daily.    Marland Kitchen glucosamine-chondroitin 500-400 MG tablet Take 1 tablet by mouth daily.    Marland Kitchen HYDROcodone-acetaminophen (VICODIN) 5-500 MG per tablet Take 1 tablet by mouth every 6 (six) hours as needed.    Marland Kitchen losartan (COZAAR) 100 MG tablet Take 1 tablet by mouth  daily 90 tablet 3  . meclizine (ANTIVERT) 25 MG tablet Take 1 tablet (25 mg total) by mouth 3 (three) times daily as needed. 90 tablet 3  . metoprolol tartrate (LOPRESSOR) 25 MG tablet Take 1 tablet by mouth  twice a day 180 tablet 3  . multivitamin (THERAGRAN) per tablet Take 1 tablet by mouth daily.      . Omega-3 Fatty Acids (FISH OIL PO) Take 1 capsule by mouth daily.    Marland Kitchen PARoxetine (PAXIL) 30 MG tablet Take 1 tablet by mouth  every morning 90 tablet 3  . pregabalin (LYRICA) 50 MG capsule 1 tablet by mouth in AM and 2 tabs by mouth at night    . raloxifene (EVISTA) 60 MG  tablet TAKE 1 TABLET BY MOUTH DAILY. 30 tablet 5   No current facility-administered medications on file prior to visit.     Review of Systems Review of Systems  Constitutional: Negative for fever, appetite change, and unexpected weight change. pos for fatigue and change in sleep habits  Eyes: Negative for pain and visual disturbance.  Respiratory: Negative for cough and shortness of breath.   Cardiovascular: Negative for cp or palpitations    Gastrointestinal: Negative for nausea, diarrhea and constipation.  Genitourinary: pos for urgency/frequency and incontinence,neg for dysuria   Skin: Negative for pallor or rash   Neurological: Negative for weakness, light-headedness, numbness and headaches.  Hematological: Negative for adenopathy. Does not bruise/bleed easily.  Psychiatric/Behavioral: Negative for dysphoric mood. The patient is occ  nervous/anxious.  (sometimes agitated) and recently more confused             Objective:   Physical Exam  Constitutional: She appears well-developed and well-nourished. No distress.  Frail appearing elderly female   HENT:  Head: Normocephalic and atraumatic.  Right Ear: External ear normal.  Left Ear: External ear normal.  Mouth/Throat: Oropharynx is clear and moist.  Nares are injected and congested  No sinus tenderness Clear rhinorrhea and post nasal drip   Eyes: Conjunctivae and EOM are normal. Pupils are equal, round, and reactive to light. Right eye exhibits no discharge. Left eye exhibits no discharge.  Neck: Normal range of motion. Neck supple.  Cardiovascular: Normal rate and normal heart sounds.   Pulmonary/Chest: Effort normal and breath sounds normal. No respiratory distress. She has no wheezes. She has no rales. She exhibits no tenderness.  Abdominal: Soft. Bowel sounds are normal. She exhibits no distension and no mass. There is no tenderness. There is no rebound and no guarding.  No cva tenderness   Musculoskeletal: She exhibits  no edema.  Lymphadenopathy:    She has no cervical adenopathy.  Neurological: She is alert.  Skin: Skin is warm and dry. No rash noted.  Psychiatric: She has a normal mood and affect.  Alert and talkative-at times tangential occ argumentative with family           Assessment & Plan:   Problem List Items Addressed This Visit    UTI (urinary tract infection) - Primary    Pos ua and some fatigue/ MS change Cover with cipro  Enc water - suspect she is borderline  dehydrated -rev this in detail  Pend cx  Update if not starting to improve in a week or if worsening        Relevant Orders   Urine culture   Viral URI with cough    Uri vs allergies- with reassuring exam and imp with mucinex  No wheezing today  Will continue expectorant prn and update if symptoms worsen       Other Visit Diagnoses    Altered mental status, unspecified altered mental status type        Relevant Orders    POCT urinalysis dipstick (Completed)    Anxiety state        Relevant Medications    ALPRAZolam (XANAX) 0.5 MG tablet

## 2015-03-19 NOTE — Telephone Encounter (Signed)
PLEASE NOTE: All timestamps contained within this report are represented as [place] Standard Time. CONFIDENTIALTY NOTICE: This fax transmission is intended only for the addressee. It contains information that is legally privileged, confidential or otherwise protected from use or disclosure. If you are not the intended recipient, you are strictly prohibited from reviewing, disclosing, copying using or disseminating any of this information or taking any action in reliance on or regarding this information. If you have received this fax in error, please notify us immediately by telephone so that we can arrange for its return to [place]. Phone: [phone], Toll-Free: [phone], Fax: [phone] Page: 1 of 2 Call Id: [id number] [name] Patient Name: Michelle Gregory Gender: Female DOB: 1923/05/30 Age: 81 Y 21 D Return Phone Number: [phone] (Primary) Address: City/State/Zip: [place] [facility] - Client Client Site [place], [place] Contact Type Call Call Type Triage / Clinical Caller Name [name] Relationship To Patient Daughter Appointment Disposition EMR Appointment Scheduled Info pasted into Epic Yes Return Phone Number [phone] (Primary) Chief Complaint CONFUSION - new onset Initial Comment caller states her mother is sleeping too much - going to sleep while sitting up - not acting right - seems confused thinks its morning when it is at night PreDisposition Call Doctor Nurse Assessment Nurse: Leilani Merl, RN, Nira Conn Date/Time ([place] Time): 03/19/2015 8:40:08 AM Confirm and document reason for call. If symptomatic, describe symptoms. ---caller states her mother is sleeping too much - going to sleep while sitting up - not acting right - seems confused thinks its morning when it is at night, after she takes her  afternoon nap. This started about 5 days ago Has the patient traveled out of the country within the last 30 days? ---Not Applicable Does the patient require triage? ---Yes Related visit to physician within the last 2 weeks? ---No Does the PT have any chronic conditions? (i.e. diabetes, asthma, etc.) ---Yes List chronic conditions. ---history of dehydration and UTI Guidelines Guideline Title Affirmed Question Affirmed Notes Nurse Date/Time (Eastern Time) Confusion - Delirium [1] Acting confused (e.g., disoriented, slurred speech) AND [2] transient (completely resolved) Standifer, RN, Heather 03/19/2015 8:41:51 AM Disp. Time [name] Time) Disposition Final User 03/19/2015 8:36:15 AM Send to Urgent Queue Byrd Hesselbach 03/19/2015 8:51:21 AM Call Completed Standifer, RN, Nira Conn PLEASE NOTE: All timestamps contained within this report are represented as [place] Standard Time. CONFIDENTIALTY NOTICE: This fax transmission is intended only for the addressee. It contains information that is legally privileged, confidential or otherwise protected from use or disclosure. If you are not the intended recipient, you are strictly prohibited from reviewing, disclosing, copying using or disseminating any of this information or taking any action in reliance on or regarding this information. If you have received this fax in error, please notify us immediately by telephone so that we can arrange for its return to [place]. Phone: [phone], Toll-Free: [phone], Fax: [phone] Page: 2 of 2 Call Id: [id number] 03/19/2015 8:45:51 AM See Physician within 4 Hours (or PCP triage) Yes Standifer, RN, Soyla Murphy Understands: Yes Disagree/Comply: Comply Care Advice Given Per Guideline SEE PHYSICIAN WITHIN 4 HOURS (or PCP triage): CALL BACK IF: * You become worse. CARE ADVICE given per Confusion-Delirium (Adult) guideline. After Care Instructions Given Call Event Type User Date / Time  Description Comments User: Ave Filter, RN Date/Time [name] Time): 03/19/2015 8:49:36 AM Appt. made for today at 12:30 pm  with Dr. Glori Bickers. Referrals REFERRED TO PCP OFFICE

## 2015-03-19 NOTE — Patient Instructions (Addendum)
Try to drink more water - aim for 8-12 servings per day   Also take cipro as directed for suspected uti  We will contact you when urine culture result returns   In meantime if symptoms worsen please let me know  muncinex is fine for congestion as needed   If cough or nasal symptoms worsen please alert me

## 2015-03-19 NOTE — Telephone Encounter (Signed)
Will see her then 

## 2015-03-21 LAB — URINE CULTURE: Colony Count: 60000

## 2015-04-02 ENCOUNTER — Other Ambulatory Visit: Payer: Self-pay | Admitting: Family Medicine

## 2015-04-09 ENCOUNTER — Ambulatory Visit (INDEPENDENT_AMBULATORY_CARE_PROVIDER_SITE_OTHER): Payer: MEDICARE | Admitting: Family Medicine

## 2015-04-09 ENCOUNTER — Encounter: Payer: Self-pay | Admitting: Family Medicine

## 2015-04-09 VITALS — BP 116/68 | HR 76 | Temp 97.4°F | Ht 62.0 in | Wt 155.0 lb

## 2015-04-09 DIAGNOSIS — Z23 Encounter for immunization: Secondary | ICD-10-CM | POA: Diagnosis not present

## 2015-04-09 DIAGNOSIS — R739 Hyperglycemia, unspecified: Secondary | ICD-10-CM

## 2015-04-09 DIAGNOSIS — I10 Essential (primary) hypertension: Secondary | ICD-10-CM | POA: Diagnosis not present

## 2015-04-09 DIAGNOSIS — M8580 Other specified disorders of bone density and structure, unspecified site: Secondary | ICD-10-CM

## 2015-04-09 DIAGNOSIS — N390 Urinary tract infection, site not specified: Secondary | ICD-10-CM | POA: Diagnosis not present

## 2015-04-09 DIAGNOSIS — E785 Hyperlipidemia, unspecified: Secondary | ICD-10-CM | POA: Diagnosis not present

## 2015-04-09 DIAGNOSIS — Z0000 Encounter for general adult medical examination without abnormal findings: Secondary | ICD-10-CM | POA: Diagnosis not present

## 2015-04-09 LAB — CBC WITH DIFFERENTIAL/PLATELET
Basophils Absolute: 0.1 10*3/uL (ref 0.0–0.1)
Basophils Relative: 0.9 % (ref 0.0–3.0)
Eosinophils Absolute: 0.6 10*3/uL (ref 0.0–0.7)
Eosinophils Relative: 9 % — ABNORMAL HIGH (ref 0.0–5.0)
HCT: 39.8 % (ref 36.0–46.0)
Hemoglobin: 13.2 g/dL (ref 12.0–15.0)
Lymphocytes Relative: 21.3 % (ref 12.0–46.0)
Lymphs Abs: 1.4 10*3/uL (ref 0.7–4.0)
MCHC: 33.3 g/dL (ref 30.0–36.0)
MCV: 98.4 fl (ref 78.0–100.0)
Monocytes Absolute: 0.5 10*3/uL (ref 0.1–1.0)
Monocytes Relative: 8.4 % (ref 3.0–12.0)
Neutro Abs: 3.8 10*3/uL (ref 1.4–7.7)
Neutrophils Relative %: 60.4 % (ref 43.0–77.0)
Platelets: 165 10*3/uL (ref 150.0–400.0)
RBC: 4.04 Mil/uL (ref 3.87–5.11)
RDW: 13.6 % (ref 11.5–15.5)
WBC: 6.4 10*3/uL (ref 4.0–10.5)

## 2015-04-09 LAB — POCT URINALYSIS DIPSTICK
Bilirubin, UA: NEGATIVE
Blood, UA: NEGATIVE
Glucose, UA: NEGATIVE
Nitrite, UA: NEGATIVE
Spec Grav, UA: 1.025
Urobilinogen, UA: 0.2
pH, UA: 5.5

## 2015-04-09 LAB — LIPID PANEL
Cholesterol: 126 mg/dL (ref 0–200)
HDL: 31.9 mg/dL — ABNORMAL LOW (ref >39.00)
LDL Cholesterol: 66 mg/dL (ref 0–99)
NonHDL: 94.1
Total CHOL/HDL Ratio: 4
Triglycerides: 139 mg/dL (ref 0.0–149.0)
VLDL: 27.8 mg/dL (ref 0.0–40.0)

## 2015-04-09 LAB — COMPREHENSIVE METABOLIC PANEL
ALT: 12 U/L (ref 0–35)
AST: 19 U/L (ref 0–37)
Albumin: 4 g/dL (ref 3.5–5.2)
Alkaline Phosphatase: 64 U/L (ref 39–117)
BUN: 26 mg/dL — ABNORMAL HIGH (ref 6–23)
CO2: 31 mEq/L (ref 19–32)
Calcium: 9.2 mg/dL (ref 8.4–10.5)
Chloride: 101 mEq/L (ref 96–112)
Creatinine, Ser: 0.92 mg/dL (ref 0.40–1.20)
GFR: 60.94 mL/min (ref >60.00)
Glucose, Bld: 100 mg/dL — ABNORMAL HIGH (ref 70–99)
Potassium: 4.7 mEq/L (ref 3.5–5.1)
Sodium: 138 mEq/L (ref 135–145)
Total Bilirubin: 0.4 mg/dL (ref 0.2–1.2)
Total Protein: 6.2 g/dL (ref 6.0–8.3)

## 2015-04-09 LAB — TSH: TSH: 1.24 u[IU]/mL (ref 0.35–4.50)

## 2015-04-09 LAB — HEMOGLOBIN A1C: Hgb A1c MFr Bld: 5.6 % (ref 4.6–6.5)

## 2015-04-09 NOTE — Assessment & Plan Note (Signed)
Reviewed health habits including diet and exercise and skin cancer prevention Reviewed appropriate screening tests for age  Also reviewed health mt list, fam hx and immunization status , as well as social and family history   See HPI Labs drawn today  Will check on cost of zostavax  Pneumovax 23 today  Declines further breast or colon cancer screen   Urged her to see dermatology for keratotic areas on her nose

## 2015-04-09 NOTE — Assessment & Plan Note (Signed)
tx with cipro in May Pt had slt dysuria times one yesterday Cannot give a sample so she will bring one back Enc water intake

## 2015-04-09 NOTE — Assessment & Plan Note (Signed)
Lab today Disc goals for lipids and reasons to control them Rev labs with pt from last check  Rev low sat fat diet in detail

## 2015-04-09 NOTE — Progress Notes (Signed)
Pre visit review using our clinic review tool, if applicable. No additional management support is needed unless otherwise documented below in the visit note. 

## 2015-04-09 NOTE — Assessment & Plan Note (Signed)
A1C today Disc low glycemic diet and wt loss to prevent DM No symptoms

## 2015-04-09 NOTE — Patient Instructions (Signed)
If you are interested in a shingles/zoster vaccine - call your insurance to check on coverage,( you should not get it within 1 month of other vaccines) , then call us for a prescription  for it to take to a pharmacy that gives the shot , or make a nurse visit to get it here depending on your coverage  Please bring back a urine sample when you can  Drink lots of water also for kidney health Pneumonia vaccine today  See a dermatologist for the spots on your nose

## 2015-04-09 NOTE — Progress Notes (Signed)
Subjective:    Patient ID: Michelle Gregory, female    DOB: 24-Apr-1923, 81 y.o.   MRN: [id number]  HPI Here for annual medicare wellness visit as well as chronic/acute medical problems and preventative visit  I have personally reviewed the Medicare Annual Wellness questionnaire and have noted 1. The patient's medical and social history 2. Their use of alcohol, tobacco or illicit drugs 3. Their current medications and supplements 4. The patient's functional ability including ADL's, fall risks, home safety risks and hearing or visual             impairment. 5. Diet and physical activities 6. Evidence for depression or mood disorders  The patients weight, height, BMI have been recorded in the chart and visual acuity is per eye clinic.  I have made referrals, counseling and provided education to the patient based review of the above and I have provided the pt with a written personalized care plan for preventive services. Reviewed and updated provider list, see scanned forms.  Has good days and bad days with age related changes   See scanned forms.  Routine anticipatory guidance given to patient.  See health maintenance. Colon cancer screening has had and tx for colon cancer in the past  Breast cancer screening- declines mammograms  Self breast exam no lumps or changes  Flu vaccine 11/15  Tetanus vaccine 1/09  Pneumovax- due for the 23 pneumovax -had the prevnar in 5/15  Zoster vaccine ? If she can afford  Declines dexa - no falls or fractures , takes her ca and D and evista  Advance directive -has a living will and POA done  Cognitive function addressed- see scanned forms- and if abnormal then additional documentation follows. - some problems remembering names-otherwise doing well   PMH and SH reviewed  Meds, vitals, and allergies reviewed.   ROS: See HPI.  Otherwise negative.    Had uti in May- will bring a specimen back for re check  Still a little burning to urinate- yesterday  one time   Due for labs   bp is stable today  No cp or palpitations or headaches or edema  No side effects to medicines  BP Readings from Last 3 Encounters:  04/09/15 116/68  03/19/15 136/86  05/09/14 100/70     Wt is stable with bmi of 28  Appetite is ok per pt - family says she eats less with age   Due to check cholesterol and blood sugar   Mood is stable -per pt and family  Rarely needs the alprazolam   Patient Active Problem List   Diagnosis Date Noted  . Viral URI with cough 03/19/2015  . UTI (urinary tract infection) 05/09/2014  . Candidal intertrigo 03/28/2014  . Excessive cerumen in both ear canals 01/17/2014  . Encounter for Medicare annual wellness exam 03/21/2013  . Gout 09/19/2012  . Osteopenia 05/05/2011  . Degenerative lumbar disc 01/20/2011  . VARICOSE VEINS, LOWER EXTREMITIES 09/04/2010  . Hyperglycemia 01/08/2010  . Hyperlipidemia 05/09/2008  . GOUT 05/09/2008  . Anxiety state 05/09/2008  . Essential hypertension 05/09/2008  . HEMORRHOIDS, INTERNAL 05/09/2008  . ALLERGIC RHINITIS 05/09/2008  . REACTIVE AIRWAY DISEASE 05/09/2008  . GERD 05/09/2008  . IBS 05/09/2008  . OSTEOARTHRITIS 05/09/2008  . COLON CANCER, HX OF 05/09/2008   Past Medical History  Diagnosis Date  . Allergic rhinitis   . Anxiety   . History of colon cancer   . Depression   . GERD (gastroesophageal reflux disease)   .  Hyperlipidemia   . Hypertension   . Osteoarthritis   . Vertigo   . Spinal compression fracture    Past Surgical History  Procedure Laterality Date  . Colon resection    . Appendectomy    . Cataract extraction    . Tonsillectomy     History  Substance Use Topics  . Smoking status: Never Smoker   . Smokeless tobacco: Never Used  . Alcohol Use: No   Family History  Problem Relation Age of Onset  . Prostate cancer Father   . Heart attack Mother   . Gout Mother    Allergies  Allergen Reactions  . Amoxicillin-Pot Clavulanate     REACTION: diarrhea    Current Outpatient Prescriptions on File Prior to Visit  Medication Sig Dispense Refill  . allopurinol (ZYLOPRIM) 100 MG tablet Take 2 tablets by mouth  daily 180 tablet 0  . ALPRAZolam (XANAX) 0.5 MG tablet 1/2 pill by mouth up to twice daily as needed for anxiety 30 tablet 1  . Ascorbic Acid (VITAMIN C) 100 MG tablet Take 100 mg by mouth daily.    Marland Kitchen aspirin 81 MG tablet Take 81 mg by mouth daily.      Marland Kitchen BIOTIN PO Take 1 tablet by mouth daily.    . calcium-vitamin D (CALCIUM 500 +D) 500 MG tablet Take 1 tablet by mouth daily.     Mariane Baumgarten Calcium (STOOL SOFTENER PO) Take 1 tablet by mouth daily.    Marland Kitchen glucosamine-chondroitin 500-400 MG tablet Take 1 tablet by mouth daily.    Marland Kitchen HYDROcodone-acetaminophen (VICODIN) 5-500 MG per tablet Take 1 tablet by mouth every 6 (six) hours as needed.    Marland Kitchen losartan (COZAAR) 100 MG tablet Take 1 tablet by mouth  daily 90 tablet 0  . meclizine (ANTIVERT) 25 MG tablet Take 1 tablet (25 mg total) by mouth 3 (three) times daily as needed. 90 tablet 3  . metoprolol tartrate (LOPRESSOR) 25 MG tablet Take 1 tablet by mouth  twice a day 180 tablet 0  . multivitamin (THERAGRAN) per tablet Take 1 tablet by mouth daily.      . Omega-3 Fatty Acids (FISH OIL PO) Take 1 capsule by mouth daily.    Marland Kitchen PARoxetine (PAXIL) 30 MG tablet Take 1 tablet by mouth  every morning 90 tablet 0  . pregabalin (LYRICA) 50 MG capsule 1 tablet by mouth in AM and 2 tabs by mouth at night    . raloxifene (EVISTA) 60 MG tablet TAKE 1 TABLET BY MOUTH DAILY. 30 tablet 5   No current facility-administered medications on file prior to visit.     Review of Systems Review of Systems  Constitutional: Negative for fever, appetite change, fatigue and unexpected weight change.  Eyes: Negative for pain and visual disturbance.  Respiratory: Negative for cough and shortness of breath.   Cardiovascular: Negative for cp or palpitations    Gastrointestinal: Negative for nausea, diarrhea and  constipation.  Genitourinary: Negative for urgency and frequency. pos for mild dysuria Skin: Negative for pallor or rash   Neurological: Negative for weakness, light-headedness, numbness and headaches. pos for occ unsteadiness (has walker and cane if needed) Hematological: Negative for adenopathy. Does not bruise/bleed easily.  Psychiatric/Behavioral: Negative for dysphoric mood. The patient is not nervous/anxious.         Objective:   Physical Exam  Constitutional: She appears well-developed and well-nourished. No distress.  overwt elderly female-well appearing   HENT:  Head: Normocephalic and atraumatic.  Right Ear:  External ear normal.  Left Ear: External ear normal.  Mouth/Throat: Oropharynx is clear and moist.  Partial cerumen occlusion  Eyes: Conjunctivae and EOM are normal. Pupils are equal, round, and reactive to light. No scleral icterus.  Neck: Normal range of motion. Neck supple. No JVD present. Carotid bruit is not present. No thyromegaly present.  Cardiovascular: Normal rate, regular rhythm, normal heart sounds and intact distal pulses.  Exam reveals no gallop.   Pulmonary/Chest: Effort normal and breath sounds normal. No respiratory distress. She has no wheezes. She exhibits no tenderness.  Abdominal: Soft. Bowel sounds are normal. She exhibits no distension, no abdominal bruit and no mass. There is no tenderness.  Genitourinary: No breast swelling, tenderness, discharge or bleeding.  Breast exam: No mass, nodules, thickening, tenderness, bulging, retraction, inflamation, nipple discharge or skin changes noted.  No axillary or clavicular LA.      Musculoskeletal: Normal range of motion. She exhibits no edema or tenderness.  Lymphadenopathy:    She has no cervical adenopathy.  Neurological: She is alert. She has normal reflexes. No cranial nerve deficit. She exhibits normal muscle tone. Coordination normal.  Skin: Skin is warm and dry. No rash noted. No erythema. No pallor.   Several keratotic areas on nose   Many SKs on trunk  Some lentigo  Psychiatric: She has a normal mood and affect.  Cheerful and mentally sharp          Assessment & Plan:   Problem List Items Addressed This Visit    Encounter for Medicare annual wellness exam - Primary    Reviewed health habits including diet and exercise and skin cancer prevention Reviewed appropriate screening tests for age  Also reviewed health mt list, fam hx and immunization status , as well as social and family history   See HPI Labs drawn today  Will check on cost of zostavax  Pneumovax 23 today  Declines further breast or colon cancer screen   Urged her to see dermatology for keratotic areas on her nose         Essential hypertension    bp in fair control at this time  BP Readings from Last 1 Encounters:  04/09/15 116/68   No changes needed Disc lifstyle change with low sodium diet and exercise  Labs today       Relevant Orders   CBC with Differential/Platelet   Comprehensive metabolic panel   TSH   Lipid panel   Hyperglycemia    A1C today Disc low glycemic diet and wt loss to prevent DM No symptoms       Relevant Orders   Hemoglobin A1c   Hyperlipidemia    Lab today Disc goals for lipids and reasons to control them Rev labs with pt from last check  Rev low sat fat diet in detail       Relevant Orders   Lipid panel   Osteopenia    Pt declines further dexa scans but takes ca and D and evista  Enc exercise Fall prev discussed No falls or fx   Disc need for calcium/ vitamin D/ wt bearing exercise and bone density test every 2 y to monitor Disc safety/ fracture risk in detail        Routine general medical examination at a health care facility    Reviewed health habits including diet and exercise and skin cancer prevention Reviewed appropriate screening tests for age  Also reviewed health mt list, fam hx and immunization status , as well as  social and family history     See HPI Labs drawn today  Will check on cost of zostavax  Pneumovax 23 today  Declines further breast or colon cancer screen   Urged her to see dermatology for keratotic areas on her nose       UTI (urinary tract infection)    tx with cipro in May Pt had slt dysuria times one yesterday Cannot give a sample so she will bring one back Enc water intake

## 2015-04-09 NOTE — Assessment & Plan Note (Signed)
Pt declines further dexa scans but takes ca and D and evista  Enc exercise Fall prev discussed No falls or fx   Disc need for calcium/ vitamin D/ wt bearing exercise and bone density test every 2 y to monitor Disc safety/ fracture risk in detail

## 2015-04-09 NOTE — Assessment & Plan Note (Signed)
bp in fair control at this time  BP Readings from Last 1 Encounters:  04/09/15 116/68   No changes needed Disc lifstyle change with low sodium diet and exercise  Labs today

## 2015-04-10 ENCOUNTER — Encounter: Payer: Self-pay | Admitting: *Deleted

## 2015-04-11 DIAGNOSIS — M5136 Other intervertebral disc degeneration, lumbar region: Secondary | ICD-10-CM | POA: Diagnosis not present

## 2015-04-11 LAB — URINE CULTURE
Colony Count: NO GROWTH
Organism ID, Bacteria: NO GROWTH

## 2015-05-02 ENCOUNTER — Ambulatory Visit: Payer: MEDICARE | Admitting: Nurse Practitioner

## 2015-05-02 ENCOUNTER — Ambulatory Visit (INDEPENDENT_AMBULATORY_CARE_PROVIDER_SITE_OTHER): Payer: MEDICARE | Admitting: Internal Medicine

## 2015-05-02 ENCOUNTER — Encounter: Payer: Self-pay | Admitting: Internal Medicine

## 2015-05-02 VITALS — BP 114/72 | HR 72 | Temp 97.9°F | Resp 14 | Ht 62.0 in | Wt 154.5 lb

## 2015-05-02 DIAGNOSIS — L600 Ingrowing nail: Secondary | ICD-10-CM | POA: Diagnosis not present

## 2015-05-02 NOTE — Patient Instructions (Signed)
Your pain is coming from an ingrown toenail  Continue soaking in Epsom salts  DO NOT ATTEMPT TO cut the toenail ,  I will ask the podiatrist to take care of it  Return if the cuticle looks enflamed (red)   Ingrown Toenail An ingrown toenail occurs when the sharp edge of your toenail grows into the skin. Causes of ingrown toenails include toenails clipped too far back or poorly fitting shoes. Activities involving sudden stops (basketball, tennis) causing "toe jamming" may lead to an ingrown nail. HOME CARE INSTRUCTIONS   Soak the whole foot in warm soapy water for 20 minutes, 3 times per day.  You may lift the edge of the nail away from the sore skin by wedging a small piece of cotton under the corner of the nail. Be careful not to dig (traumatize) and cause more injury to the area.  Wear shoes that fit well. While the ingrown nail is causing problems, sandals may be beneficial.  Trim your toenails regularly and carefully. Cut your toenails straight across, not in a curve. This will prevent injury to the skin at the corners of the toenail.  Keep your feet clean and dry.  Crutches may be helpful early in treatment if walking is painful.  Antibiotics, if prescribed, should be taken as directed.  Return for a wound check in 2 days or as directed.  Only take over-the-counter or prescription medicines for pain, discomfort, or fever as directed by your caregiver. SEEK IMMEDIATE MEDICAL CARE IF:   You have a fever.  You have increasing pain, redness, swelling, or heat at the wound site.  Your toe is not better in 7 days. If conservative treatment is not successful, surgical removal of a portion or all of the nail may be necessary. MAKE SURE YOU:   Understand these instructions.  Will watch your condition.  Will get help right away if you are not doing well or get worse. Document Released: 10/16/2000 Document Revised: 01/11/2012 Document Reviewed: 10/10/2008 [facility] Patient  Information 2015 [place], [place]. This information is not intended to replace advice given to you by your health care provider. Make sure you discuss any questions you have with your health care provider.

## 2015-05-04 DIAGNOSIS — L600 Ingrowing nail: Secondary | ICD-10-CM | POA: Insufficient documentation

## 2015-05-04 NOTE — Assessment & Plan Note (Signed)
Supportive are and referral to podiatry

## 2015-05-04 NOTE — Progress Notes (Signed)
Subjective:  Patient ID: Michelle Gregory, female    DOB: [birth year]  Aga caregiver e: 81 y.o. MRN: [id number]  CC: The primary encounter diagnosis was Ingrown left big toenail. A diagnosis of Ingrown toenail without infection was also pertinent to this visit.  HPI Michelle Gregory presents for several day history of pain involving the tip of her left great toe. She has difficulty cutting her toenails due to increased thickness and has a caregiver provides this service.  Denies any bleleding cuticle,  redness or swelling.   Outpatient Prescriptions Prior to Visit  Medication Sig Dispense Refill  . allopurinol (ZYLOPRIM) 100 MG tablet Take 2 tablets by mouth  daily 180 tablet 0  . ALPRAZolam (XANAX) 0.5 MG tablet 1/2 pill by mouth up to twice daily as needed for anxiety 30 tablet 1  . Ascorbic Acid (VITAMIN C) 100 MG tablet Take 100 mg by mouth daily.    Marland Kitchen aspirin 81 MG tablet Take 81 mg by mouth daily.      Marland Kitchen BIOTIN PO Take 1 tablet by mouth daily.    . calcium-vitamin D (CALCIUM 500 +D) 500 MG tablet Take 1 tablet by mouth daily.     Mariane Baumgarten Calcium (STOOL SOFTENER PO) Take 1 tablet by mouth daily.    Marland Kitchen glucosamine-chondroitin 500-400 MG tablet Take 1 tablet by mouth daily.    Marland Kitchen HYDROcodone-acetaminophen (VICODIN) 5-500 MG per tablet Take 1 tablet by mouth every 6 (six) hours as needed.    Marland Kitchen losartan (COZAAR) 100 MG tablet Take 1 tablet by mouth  daily 90 tablet 0  . meclizine (ANTIVERT) 25 MG tablet Take 1 tablet (25 mg total) by mouth 3 (three) times daily as needed. 90 tablet 3  . metoprolol tartrate (LOPRESSOR) 25 MG tablet Take 1 tablet by mouth  twice a day 180 tablet 0  . multivitamin (THERAGRAN) per tablet Take 1 tablet by mouth daily.      . Omega-3 Fatty Acids (FISH OIL PO) Take 1 capsule by mouth daily.    Marland Kitchen PARoxetine (PAXIL) 30 MG tablet Take 1 tablet by mouth  every morning 90 tablet 0  . pregabalin (LYRICA) 50 MG capsule 1 tablet by mouth in AM and 2 tabs by mouth at night      . raloxifene (EVISTA) 60 MG tablet TAKE 1 TABLET BY MOUTH DAILY. 30 tablet 5   No facility-administered medications prior to visit.    Review of Systems;  Patient denies headache, fevers, malaise, unintentional weight loss, skin rash, eye pain, sinus congestion and sinus pain, sore throat, dysphagia,  hemoptysis , cough, dyspnea, wheezing, chest pain, palpitations, orthopnea, edema, abdominal pain, nausea, melena, diarrhea, constipation, flank pain, dysuria, hematuria, urinary  Frequency, nocturia, numbness, tingling, seizures,  Focal weakness, Loss of consciousness,  Tremor, insomnia, depression, anxiety, and suicidal ideation.      Objective:  BP 114/72 mmHg  Pulse 72  Temp(Src) 97.9 F (36.6 C) (Oral)  Resp 14  Ht 5\' 2"  (1.575 m)  Wt 154 lb 8 oz (70.081 kg)  BMI 28.25 kg/m2  SpO2 95%  LMP 11/02/1980  BP Readings from Last 3 Encounters:  05/02/15 114/72  04/09/15 116/68  03/19/15 136/86    Wt Readings from Last 3 Encounters:  05/02/15 154 lb 8 oz (70.081 kg)  04/09/15 155 lb (70.308 kg)  03/19/15 155 lb 8 oz (70.534 kg)    General appearance: alert, cooperative and appears stated age Neck: no adenopathy, no carotid bruit, supple, symmetrical, trachea midline  and thyroid not enlarged, symmetric, no tenderness/mass/nodules Back: symmetric, no curvature. ROM normal. No CVA tenderness. Lungs: clear to auscultation bilaterally Heart: regular rate and rhythm, S1, S2 normal, no murmur, click, rub or gallop Abdomen: soft, non-tender; bowel sounds normal; no masses,  no organomegaly Pulses: 2+ and symmetric Skin: Skin color, texture, turgor normal. No rashes or lesions Lymph nodes: Cervical, supraclavicular, and axillary nodes normal. Feet: ingrown toenail left great toe, no no inflammation or erythema  Lab Results  Component Value Date   HGBA1C 5.6 04/09/2015   HGBA1C 5.6 03/28/2014   HGBA1C 5.6 03/21/2013    Lab Results  Component Value Date   CREATININE 0.92  04/09/2015   CREATININE 0.9 03/28/2014   CREATININE 0.9 03/21/2013    Lab Results  Component Value Date   WBC 6.4 04/09/2015   HGB 13.2 04/09/2015   HCT 39.8 04/09/2015   PLT 165.0 04/09/2015   GLUCOSE 100* 04/09/2015   CHOL 126 04/09/2015   TRIG 139.0 04/09/2015   HDL 31.90* 04/09/2015   LDLDIRECT 113.7 07/08/2010   LDLCALC 66 04/09/2015   ALT 12 04/09/2015   AST 19 04/09/2015   NA 138 04/09/2015   K 4.7 04/09/2015   CL 101 04/09/2015   CREATININE 0.92 04/09/2015   BUN 26* 04/09/2015   CO2 31 04/09/2015   TSH 1.24 04/09/2015   INR 1.10 10/23/2010   HGBA1C 5.6 04/09/2015     Assessment & Plan:   Problem List Items Addressed This Visit      Unprioritized   Ingrown toenail without infection    Supportive are and referral to podiatry        Other Visit Diagnoses    Ingrown left big toenail    -  Primary    Relevant Orders    Ambulatory referral to Podiatry       I am having Ms. Prettyman maintain her aspirin, calcium-vitamin D, multivitamin, pregabalin, HYDROcodone-acetaminophen, vitamin C, glucosamine-chondroitin, meclizine, Omega-3 Fatty Acids (FISH OIL PO), Docusate Calcium (STOOL SOFTENER PO), BIOTIN PO, raloxifene, ALPRAZolam, losartan, allopurinol, PARoxetine, and metoprolol tartrate.  No orders of the defined types were placed in this encounter.    There are no discontinued medications.  Follow-up: No Follow-up on file.   Crecencio Mc, MD

## 2015-05-08 DIAGNOSIS — D225 Melanocytic nevi of trunk: Secondary | ICD-10-CM | POA: Diagnosis not present

## 2015-05-08 DIAGNOSIS — C44311 Basal cell carcinoma of skin of nose: Secondary | ICD-10-CM | POA: Diagnosis not present

## 2015-05-08 DIAGNOSIS — X32XXXA Exposure to sunlight, initial encounter: Secondary | ICD-10-CM | POA: Diagnosis not present

## 2015-05-08 DIAGNOSIS — L570 Actinic keratosis: Secondary | ICD-10-CM | POA: Diagnosis not present

## 2015-05-09 ENCOUNTER — Other Ambulatory Visit: Payer: Self-pay | Admitting: Family Medicine

## 2015-05-27 ENCOUNTER — Ambulatory Visit (INDEPENDENT_AMBULATORY_CARE_PROVIDER_SITE_OTHER): Payer: MEDICARE | Admitting: Family Medicine

## 2015-05-27 ENCOUNTER — Encounter: Payer: Self-pay | Admitting: Family Medicine

## 2015-05-27 VITALS — BP 110/64 | HR 99 | Temp 98.4°F | Ht 62.0 in | Wt 157.5 lb

## 2015-05-27 DIAGNOSIS — I10 Essential (primary) hypertension: Secondary | ICD-10-CM

## 2015-05-27 DIAGNOSIS — J302 Other seasonal allergic rhinitis: Secondary | ICD-10-CM | POA: Diagnosis not present

## 2015-05-27 DIAGNOSIS — H6121 Impacted cerumen, right ear: Secondary | ICD-10-CM

## 2015-05-27 NOTE — Assessment & Plan Note (Signed)
Improved partially after irrigation  Will use debrox otc at home to see if this helps further  Hearing improved  Will update if new symptoms

## 2015-05-27 NOTE — Progress Notes (Signed)
Subjective:    Patient ID: Michelle Gregory, female    DOB: 09-Jan-1923, 81 y.o.   MRN: [id number]  HPI Here for some congestion and hearing problems   Nose constantly runs baseline  Yesterday - she could not hear the preacher at church - then her ear popped  Not really congested - all clear d/c  Throat felt scratchy a few days No fever  Appetite is good  No sinus pain  Last week head didn't feel right - better now    Family is worried she is not getting enough fluids  They are worried about dehydration  Daughter struggles to get a bottle of water in her a day    Chemistry      Component Value Date/Time   NA 138 04/09/2015 1027   K 4.7 04/09/2015 1027   CL 101 04/09/2015 1027   CO2 31 04/09/2015 1027   BUN 26* 04/09/2015 1027   CREATININE 0.92 04/09/2015 1027      Component Value Date/Time   CALCIUM 9.2 04/09/2015 1027   ALKPHOS 64 04/09/2015 1027   AST 19 04/09/2015 1027   ALT 12 04/09/2015 1027   BILITOT 0.4 04/09/2015 1027       bp is stable today  No cp or palpitations or headaches or edema  No side effects to medicines  BP Readings from Last 3 Encounters:  05/27/15 110/64  05/02/15 114/72  04/09/15 116/68     Patient Active Problem List   Diagnosis Date Noted  . Excessive cerumen in right ear canal 05/27/2015  . Ingrown toenail without infection 05/04/2015  . Routine general medical examination at a health care facility 04/09/2015  . Viral URI with cough 03/19/2015  . UTI (urinary tract infection) 05/09/2014  . Candidal intertrigo 03/28/2014  . Excessive cerumen in both ear canals 01/17/2014  . Encounter for Medicare annual wellness exam 03/21/2013  . Gout 09/19/2012  . Osteopenia 05/05/2011  . Degenerative lumbar disc 01/20/2011  . VARICOSE VEINS, LOWER EXTREMITIES 09/04/2010  . Hyperglycemia 01/08/2010  . Hyperlipidemia 05/09/2008  . GOUT 05/09/2008  . Anxiety state 05/09/2008  . Essential hypertension 05/09/2008  . HEMORRHOIDS, INTERNAL  05/09/2008  . Allergic rhinitis 05/09/2008  . REACTIVE AIRWAY DISEASE 05/09/2008  . GERD 05/09/2008  . IBS 05/09/2008  . OSTEOARTHRITIS 05/09/2008  . COLON CANCER, HX OF 05/09/2008   Past Medical History  Diagnosis Date  . Allergic rhinitis   . Anxiety   . History of colon cancer   . Depression   . GERD (gastroesophageal reflux disease)   . Hyperlipidemia   . Hypertension   . Osteoarthritis   . Vertigo   . Spinal compression fracture    Past Surgical History  Procedure Laterality Date  . Colon resection    . Appendectomy    . Cataract extraction    . Tonsillectomy     History  Substance Use Topics  . Smoking status: Never Smoker   . Smokeless tobacco: Never Used  . Alcohol Use: No   Family History  Problem Relation Age of Onset  . Prostate cancer Father   . Heart attack Mother   . Gout Mother    Allergies  Allergen Reactions  . Amoxicillin-Pot Clavulanate     REACTION: diarrhea   Current Outpatient Prescriptions on File Prior to Visit  Medication Sig Dispense Refill  . allopurinol (ZYLOPRIM) 100 MG tablet Take 2 tablets by mouth  daily 180 tablet 0  . ALPRAZolam (XANAX) 0.5 MG tablet 1/2 pill  by mouth up to twice daily as needed for anxiety 30 tablet 1  . Ascorbic Acid (VITAMIN C) 100 MG tablet Take 100 mg by mouth daily.    Marland Kitchen aspirin 81 MG tablet Take 81 mg by mouth daily.      Marland Kitchen BIOTIN PO Take 1 tablet by mouth daily.    . calcium-vitamin D (CALCIUM 500 +D) 500 MG tablet Take 1 tablet by mouth daily.     Mariane Baumgarten Calcium (STOOL SOFTENER PO) Take 1 tablet by mouth daily.    Marland Kitchen glucosamine-chondroitin 500-400 MG tablet Take 1 tablet by mouth daily.    Marland Kitchen HYDROcodone-acetaminophen (VICODIN) 5-500 MG per tablet Take 1 tablet by mouth every 6 (six) hours as needed.    Marland Kitchen losartan (COZAAR) 100 MG tablet Take 1 tablet by mouth  daily 90 tablet 0  . meclizine (ANTIVERT) 25 MG tablet Take 1 tablet (25 mg total) by mouth 3 (three) times daily as needed. 90 tablet 3  .  metoprolol tartrate (LOPRESSOR) 25 MG tablet Take 1 tablet by mouth  twice a day 180 tablet 0  . multivitamin (THERAGRAN) per tablet Take 1 tablet by mouth daily.      . Omega-3 Fatty Acids (FISH OIL PO) Take 1 capsule by mouth daily.    Marland Kitchen PARoxetine (PAXIL) 30 MG tablet Take 1 tablet by mouth  every morning 90 tablet 0  . pregabalin (LYRICA) 50 MG capsule 1 tablet by mouth in AM and 2 tabs by mouth at night    . raloxifene (EVISTA) 60 MG tablet TAKE 1 TABLET BY MOUTH DAILY. 30 tablet 5   No current facility-administered medications on file prior to visit.    Review of Systems Review of Systems  Constitutional: Negative for fever, appetite change, fatigue and unexpected weight change.  Eyes: Negative for pain and visual disturbance.  ENT pos for hearing loss and ear pressure/neg for ear pain or drainage  Respiratory: Negative for cough and shortness of breath.   Cardiovascular: Negative for cp or palpitations    Gastrointestinal: Negative for nausea, diarrhea and constipation.  Genitourinary: Negative for urgency and frequency.  Skin: Negative for pallor or rash   Neurological: Negative for weakness, light-headedness, numbness and headaches.  Hematological: Negative for adenopathy. Does not bruise/bleed easily.  Psychiatric/Behavioral: Negative for dysphoric mood. The patient is not nervous/anxious.         Objective:   Physical Exam  Constitutional: She appears well-developed and well-nourished. No distress.  Well appearing overwt elderly female   HENT:  Head: Normocephalic and atraumatic.  Mouth/Throat: Oropharynx is clear and moist.  L TM clear  R TM obsc by cerumen impaction - partially cleared by simple irrigation  Then TM appears clear   Nares are boggy with clear rhinorrhea  No sinus or temporal tenderness   Eyes: Conjunctivae and EOM are normal. Pupils are equal, round, and reactive to light. Right eye exhibits no discharge. Left eye exhibits no discharge. No scleral  icterus.  Neck: Normal range of motion. Neck supple. No JVD present. Carotid bruit is not present. No thyromegaly present.  Cardiovascular: Normal rate, regular rhythm, normal heart sounds and intact distal pulses.  Exam reveals no gallop.   Pulmonary/Chest: Effort normal and breath sounds normal. No respiratory distress. She has no wheezes. She has no rales.  No crackles  Abdominal: Soft. Bowel sounds are normal. She exhibits no distension, no abdominal bruit and no mass. There is no tenderness.  Musculoskeletal: She exhibits no edema.  Lymphadenopathy:  She has no cervical adenopathy.  Neurological: She is alert. She has normal reflexes.  Skin: Skin is warm and dry. No rash noted.  Psychiatric: She has a normal mood and affect.          Assessment & Plan:  + Problem List Items Addressed This Visit    Allergic rhinitis    Suggest adding nasal steroid spray like flonase daily for rhinorrhea/congestion and ETD Update if worse or no improvement       Essential hypertension - Primary   Excessive cerumen in right ear canal    Improved partially after irrigation  Will use debrox otc at home to see if this helps further  Hearing improved  Will update if new symptoms

## 2015-05-27 NOTE — Patient Instructions (Addendum)
For allergy drip and ears popping - try flonase nasal spray over the counter - 2 sprays in each nostril once daily  Blood pressure is ok  Increase your fluids - target of 8 or more eight oz glasses of water per day  You can buy debrox or another brand of ear wax dissolving medicine over the counter to help with ear wax in the right ear   If no improvement please let me know

## 2015-05-27 NOTE — Assessment & Plan Note (Signed)
Suggest adding nasal steroid spray like flonase daily for rhinorrhea/congestion and ETD Update if worse or no improvement

## 2015-05-27 NOTE — Progress Notes (Signed)
Pre visit review using our clinic review tool, if applicable. No additional management support is needed unless otherwise documented below in the visit note. 

## 2015-05-28 ENCOUNTER — Ambulatory Visit (INDEPENDENT_AMBULATORY_CARE_PROVIDER_SITE_OTHER): Payer: MEDICARE | Admitting: Podiatry

## 2015-05-28 VITALS — BP 110/78 | HR 74 | Resp 16

## 2015-05-28 DIAGNOSIS — M79676 Pain in unspecified toe(s): Secondary | ICD-10-CM | POA: Diagnosis not present

## 2015-05-28 DIAGNOSIS — B351 Tinea unguium: Secondary | ICD-10-CM | POA: Diagnosis not present

## 2015-05-28 DIAGNOSIS — L600 Ingrowing nail: Secondary | ICD-10-CM

## 2015-05-28 NOTE — Patient Instructions (Signed)
Ingrown Toenail An ingrown toenail occurs when the sharp edge of your toenail grows into the skin. Causes of ingrown toenails include toenails clipped too far back or poorly fitting shoes. Activities involving sudden stops (basketball, tennis) causing "toe jamming" may lead to an ingrown nail. HOME CARE INSTRUCTIONS   Soak the whole foot in warm soapy water for 20 minutes, 3 times per day.  You may lift the edge of the nail away from the sore skin by wedging a small piece of cotton under the corner of the nail. Be careful not to dig (traumatize) and cause more injury to the area.  Wear shoes that fit well. While the ingrown nail is causing problems, sandals may be beneficial.  Trim your toenails regularly and carefully. Cut your toenails straight across, not in a curve. This will prevent injury to the skin at the corners of the toenail.  Keep your feet clean and dry.  Crutches may be helpful early in treatment if walking is painful.  Antibiotics, if prescribed, should be taken as directed.  Return for a wound check in 2 days or as directed.  Only take over-the-counter or prescription medicines for pain, discomfort, or fever as directed by your caregiver. SEEK IMMEDIATE MEDICAL CARE IF:   You have a fever.  You have increasing pain, redness, swelling, or heat at the wound site.  Your toe is not better in 7 days. If conservative treatment is not successful, surgical removal of a portion or all of the nail may be necessary. MAKE SURE YOU:   Understand these instructions.  Will watch your condition.  Will get help right away if you are not doing well or get worse. Document Released: 10/16/2000 Document Revised: 01/11/2012 Document Reviewed: 10/10/2008 [employer] Patient Information 2015 [employer]. This information is not intended to replace advice given to you by your health care provider. Make sure you discuss any questions you have with your health care provider.  

## 2015-05-29 NOTE — Progress Notes (Signed)
Subjective:     Patient ID: Michelle Gregory, female   DOB: Apr 08, 1923, 81 y.o.   MRN: [id number]  HPI 81 year old female presents the office today with concerns of painful, thick, elongated toenails for which she is unable to trim herself. She is also concerned that she may have an ingrown toenail started on the left side. She denies any redness or drainage from the area. She said her nails are painful with pressure and certain shoes. No other complaints at this time.  Review of Systems  All other systems reviewed and are negative.      Objective:   Physical Exam AAO x3, NAD DP/PT pulses palpable 1/4 bilaterally, CRT less than 3 seconds Protective sensation decreased with Simms Weinstein monofilament,  Achilles tendon reflex intact Nails are hypertrophic, dystrophic, brittle, discolored, elongated 10. There is evidence incurvation on both the medial and lateral nail borders left hallux toenail distally. There is no pain on the proximal nail borders. There is no swelling erythema or drainage on the nail sites. No other areas of tenderness to bilateral lower extremities. MMT 5/5, ROM WNL.  No open lesions or pre-ulcerative lesions.  No overlying edema, erythema, increase in warmth to bilateral lower extremities.  No pain with calf compression, swelling, warmth, erythema bilaterally.      Assessment:     81 year old female with symptomatic onychomycosis, ingrown toenail without infection    Plan:     -Treatment options discussed including all alternatives, risks, and complications -Nail sharply debrided 10 without complications the pain. This and did not ingrown portion of the left hallux toenail sharply debrided without complication/bleeding. -Discussed importance daily foot inspection. -Follow-up 3 months or sooner if any problems arise or the ingrown toenail remains problamatic. In the meantime, encouraged to call the office with any questions, concerns, change in symptoms.   Celesta Gentile, DPM

## 2015-06-05 ENCOUNTER — Other Ambulatory Visit: Payer: Self-pay | Admitting: Family Medicine

## 2015-06-17 ENCOUNTER — Telehealth: Payer: Self-pay

## 2015-06-17 NOTE — Telephone Encounter (Signed)
She has these recurrently - it is ok with me to drop off specimen this time  If she worsens-fever/ n/v/pain/confusion hematuria etc-she needs to be seen

## 2015-06-17 NOTE — Telephone Encounter (Signed)
Daughter notified of sxs to have pt be eval by provider, pt isn't having any of these sxs so she will drop off a urine sample tomorrow

## 2015-06-17 NOTE — Telephone Encounter (Signed)
Michelle Gregory pts daughter left v/m requesting cb; pt thinks has UTI but will not come in for appt. Michelle Gregory has urine container from office and wants to know if can bring in U/A to be checked. Unable to contact [name] by phone to get more details of UTI symptoms.

## 2015-06-17 NOTE — Telephone Encounter (Signed)
Michelle Gregory Coble left v/m; I called back and spoke with Michelle Gregory; pt having burning and pain upon urination with frequency; pt said urine looks green; pt has not taken any med to chg color of urine.  Does not see blood in urine. No fever and no confusion.  Pt does not want to come in for appt and wants to send specimen to be checked.

## 2015-06-18 ENCOUNTER — Telehealth: Payer: Self-pay | Admitting: *Deleted

## 2015-06-18 ENCOUNTER — Other Ambulatory Visit (INDEPENDENT_AMBULATORY_CARE_PROVIDER_SITE_OTHER): Payer: MEDICARE

## 2015-06-18 DIAGNOSIS — R8290 Unspecified abnormal findings in urine: Secondary | ICD-10-CM

## 2015-06-18 DIAGNOSIS — R8299 Other abnormal findings in urine: Secondary | ICD-10-CM | POA: Diagnosis not present

## 2015-06-18 DIAGNOSIS — R300 Dysuria: Secondary | ICD-10-CM | POA: Diagnosis not present

## 2015-06-18 LAB — POCT URINALYSIS DIPSTICK
Bilirubin, UA: NEGATIVE
Blood, UA: NEGATIVE
Glucose, UA: NEGATIVE
Ketones, UA: NEGATIVE
Nitrite, UA: NEGATIVE
Protein, UA: NEGATIVE
Spec Grav, UA: 1.015
Urobilinogen, UA: 0.2
pH, UA: 6

## 2015-06-18 NOTE — Telephone Encounter (Signed)
Perhaps if there is a beverage that she prefers (lemonade/ weak tea/ or add flavor to her water) it would help  The extra sugar is not optimal- but it may be worth it  Popcicles/ water melon as well -have fluid in them This is not uncommon in folks her age -both thirst and appetite decrease

## 2015-06-18 NOTE — Telephone Encounter (Signed)
Daughter notified of Dr. Marliss Coots recommendations and verbalized understanding

## 2015-06-18 NOTE — Telephone Encounter (Signed)
Pt Daughter [name] dropped off a urine sample, she wanted me to ask Dr. Glori Bickers if she could recommend any way to get pt to drink more water, pt is refusing to drink water and is starting to refuse to eat too, daughter just wanted to know if there is something she can do or you can recommend to help pt, because she doesn't want her to get dehydrated

## 2015-06-19 LAB — URINE CULTURE
Colony Count: NO GROWTH
Organism ID, Bacteria: NO GROWTH

## 2015-06-20 ENCOUNTER — Telehealth: Payer: Self-pay | Admitting: Family Medicine

## 2015-06-20 NOTE — Telephone Encounter (Signed)
Addressed through result notes  

## 2015-06-20 NOTE — Telephone Encounter (Signed)
Daughter requests call back regarding labs. Callback number is [phone]

## 2015-07-09 DIAGNOSIS — L570 Actinic keratosis: Secondary | ICD-10-CM | POA: Diagnosis not present

## 2015-07-09 DIAGNOSIS — L821 Other seborrheic keratosis: Secondary | ICD-10-CM | POA: Diagnosis not present

## 2015-07-09 DIAGNOSIS — Z08 Encounter for follow-up examination after completed treatment for malignant neoplasm: Secondary | ICD-10-CM | POA: Diagnosis not present

## 2015-07-09 DIAGNOSIS — X32XXXA Exposure to sunlight, initial encounter: Secondary | ICD-10-CM | POA: Diagnosis not present

## 2015-07-09 DIAGNOSIS — Z85828 Personal history of other malignant neoplasm of skin: Secondary | ICD-10-CM | POA: Diagnosis not present

## 2015-08-29 ENCOUNTER — Ambulatory Visit: Payer: MEDICARE | Admitting: Podiatry

## 2015-09-09 ENCOUNTER — Telehealth: Payer: Self-pay

## 2015-09-09 NOTE — Telephone Encounter (Signed)
Stool softeners and miralax are the safest options Make sure to drink lots of fluids Also eat fruits /veggies/fiber  If she feels like she may be backed up/ impacted - I recommend pushing the miralax to one dose (as directed with liquid) three times daily - until she starts having regular BMs that are easier to pass Then titrate it down to effect and see how much she needs from there  miralax does sometimes take several days to work   Follow up if no improvement

## 2015-09-09 NOTE — Telephone Encounter (Signed)
Pt and daughter notified of Dr. Marliss Coots instructions and recommendations and verbalized understanding

## 2015-09-09 NOTE — Telephone Encounter (Signed)
Pt said for 1 -2 weeks pt has problem with constipation; pt has to put on gloves to get BM started; this morning pt had small amt of soft BM; pt is using large amt of toilet tissue to get completely cleaned even though having small BM. Pt said her daughter said she is not eating enough to have a large BM pt wants to know is there anything else she can do to have a better BM. Pt is presently taking miralax. Pt request cb.[facility].

## 2015-09-19 ENCOUNTER — Ambulatory Visit: Payer: MEDICARE

## 2015-10-01 ENCOUNTER — Telehealth: Payer: Self-pay

## 2015-10-01 NOTE — Telephone Encounter (Signed)
Left message for patient to return phone call about flu shot vaccination.

## 2015-10-07 DIAGNOSIS — Z79891 Long term (current) use of opiate analgesic: Secondary | ICD-10-CM | POA: Diagnosis not present

## 2015-10-07 DIAGNOSIS — G894 Chronic pain syndrome: Secondary | ICD-10-CM | POA: Diagnosis not present

## 2015-10-07 DIAGNOSIS — M5136 Other intervertebral disc degeneration, lumbar region: Secondary | ICD-10-CM | POA: Diagnosis not present

## 2015-12-13 DIAGNOSIS — Z79891 Long term (current) use of opiate analgesic: Secondary | ICD-10-CM | POA: Diagnosis not present

## 2015-12-13 DIAGNOSIS — G894 Chronic pain syndrome: Secondary | ICD-10-CM | POA: Diagnosis not present

## 2015-12-13 DIAGNOSIS — M5136 Other intervertebral disc degeneration, lumbar region: Secondary | ICD-10-CM | POA: Diagnosis not present

## 2015-12-17 ENCOUNTER — Other Ambulatory Visit: Payer: Self-pay | Admitting: Family Medicine

## 2015-12-18 ENCOUNTER — Emergency Department (HOSPITAL_COMMUNITY): Payer: MEDICARE

## 2015-12-18 ENCOUNTER — Telehealth: Payer: Self-pay | Admitting: Family Medicine

## 2015-12-18 ENCOUNTER — Encounter (HOSPITAL_COMMUNITY): Payer: Self-pay | Admitting: Family Medicine

## 2015-12-18 ENCOUNTER — Emergency Department (HOSPITAL_COMMUNITY)
Admission: EM | Admit: 2015-12-18 | Discharge: 2015-12-18 | Disposition: A | Discharge status: Home / Self Care | Payer: MEDICARE | Attending: Emergency Medicine | Admitting: Emergency Medicine

## 2015-12-18 DIAGNOSIS — I10 Essential (primary) hypertension: Secondary | ICD-10-CM | POA: Diagnosis not present

## 2015-12-18 DIAGNOSIS — Z880 Allergy status to penicillin: Secondary | ICD-10-CM | POA: Insufficient documentation

## 2015-12-18 DIAGNOSIS — K5900 Constipation, unspecified: Secondary | ICD-10-CM | POA: Diagnosis not present

## 2015-12-18 DIAGNOSIS — M1990 Unspecified osteoarthritis, unspecified site: Secondary | ICD-10-CM | POA: Diagnosis not present

## 2015-12-18 DIAGNOSIS — M545 Low back pain: Secondary | ICD-10-CM | POA: Diagnosis not present

## 2015-12-18 DIAGNOSIS — F419 Anxiety disorder, unspecified: Secondary | ICD-10-CM | POA: Insufficient documentation

## 2015-12-18 DIAGNOSIS — Z79899 Other long term (current) drug therapy: Secondary | ICD-10-CM | POA: Diagnosis not present

## 2015-12-18 DIAGNOSIS — Z8639 Personal history of other endocrine, nutritional and metabolic disease: Secondary | ICD-10-CM | POA: Diagnosis not present

## 2015-12-18 DIAGNOSIS — W19XXXS Unspecified fall, sequela: Secondary | ICD-10-CM

## 2015-12-18 DIAGNOSIS — R350 Frequency of micturition: Secondary | ICD-10-CM | POA: Insufficient documentation

## 2015-12-18 DIAGNOSIS — F329 Major depressive disorder, single episode, unspecified: Secondary | ICD-10-CM | POA: Diagnosis not present

## 2015-12-18 DIAGNOSIS — Z85038 Personal history of other malignant neoplasm of large intestine: Secondary | ICD-10-CM | POA: Insufficient documentation

## 2015-12-18 DIAGNOSIS — S3991XA Unspecified injury of abdomen, initial encounter: Secondary | ICD-10-CM | POA: Diagnosis not present

## 2015-12-18 DIAGNOSIS — W1839XS Other fall on same level, sequela: Secondary | ICD-10-CM | POA: Diagnosis not present

## 2015-12-18 DIAGNOSIS — S22000A Wedge compression fracture of unspecified thoracic vertebra, initial encounter for closed fracture: Secondary | ICD-10-CM

## 2015-12-18 DIAGNOSIS — S22000S Wedge compression fracture of unspecified thoracic vertebra, sequela: Secondary | ICD-10-CM | POA: Insufficient documentation

## 2015-12-18 DIAGNOSIS — Z7982 Long term (current) use of aspirin: Secondary | ICD-10-CM | POA: Diagnosis not present

## 2015-12-18 DIAGNOSIS — M546 Pain in thoracic spine: Secondary | ICD-10-CM | POA: Insufficient documentation

## 2015-12-18 DIAGNOSIS — R1030 Lower abdominal pain, unspecified: Secondary | ICD-10-CM | POA: Diagnosis not present

## 2015-12-18 DIAGNOSIS — R1032 Left lower quadrant pain: Secondary | ICD-10-CM | POA: Diagnosis present

## 2015-12-18 LAB — CBC WITH DIFFERENTIAL/PLATELET
Basophils Absolute: 0 10*3/uL (ref 0.0–0.1)
Basophils Relative: 0 %
Eosinophils Absolute: 0.3 10*3/uL (ref 0.0–0.7)
Eosinophils Relative: 4 %
HCT: 41.9 % (ref 36.0–46.0)
Hemoglobin: 14.3 g/dL (ref 12.0–15.0)
Lymphocytes Relative: 13 %
Lymphs Abs: 0.8 10*3/uL (ref 0.7–4.0)
MCH: 33 pg (ref 26.0–34.0)
MCHC: 34.1 g/dL (ref 30.0–36.0)
MCV: 96.8 fL (ref 78.0–100.0)
Monocytes Absolute: 0.7 10*3/uL (ref 0.1–1.0)
Monocytes Relative: 10 %
Neutro Abs: 4.9 10*3/uL (ref 1.7–7.7)
Neutrophils Relative %: 73 %
Platelets: 160 10*3/uL (ref 150–400)
RBC: 4.33 MIL/uL (ref 3.87–5.11)
RDW: 12.5 % (ref 11.5–15.5)
WBC: 6.7 10*3/uL (ref 4.0–10.5)

## 2015-12-18 LAB — COMPREHENSIVE METABOLIC PANEL
ALT: 12 U/L — ABNORMAL LOW (ref 14–54)
AST: 22 U/L (ref 15–41)
Albumin: 3.7 g/dL (ref 3.5–5.0)
Alkaline Phosphatase: 42 U/L (ref 38–126)
Anion gap: 15 (ref 5–15)
BUN: 17 mg/dL (ref 6–20)
CO2: 25 mmol/L (ref 22–32)
Calcium: 9.5 mg/dL (ref 8.9–10.3)
Chloride: 99 mmol/L — ABNORMAL LOW (ref 101–111)
Creatinine, Ser: 0.84 mg/dL (ref 0.44–1.00)
GFR calc Af Amer: >60 mL/min (ref >60)
GFR calc non Af Amer: 59 mL/min — ABNORMAL LOW (ref >60)
Glucose, Bld: 106 mg/dL — ABNORMAL HIGH (ref 65–99)
Potassium: 4.3 mmol/L (ref 3.5–5.1)
Sodium: 139 mmol/L (ref 135–145)
Total Bilirubin: 1.1 mg/dL (ref 0.3–1.2)
Total Protein: 6.1 g/dL — ABNORMAL LOW (ref 6.5–8.1)

## 2015-12-18 MED ORDER — HYDROCODONE-ACETAMINOPHEN 5-325 MG PO TABS
2.0000 | ORAL_TABLET | Freq: Once | ORAL | Status: AC
  Administered 2015-12-18: 2 via ORAL
  Filled 2015-12-18: qty 2

## 2015-12-18 MED ORDER — IOHEXOL 300 MG/ML  SOLN
100.0000 mL | Freq: Once | INTRAMUSCULAR | Status: AC | PRN
  Administered 2015-12-18: 100 mL via INTRAVENOUS

## 2015-12-18 MED ORDER — MAGNESIUM CITRATE PO SOLN: 0.5000 | Freq: Once | ORAL | Status: DC

## 2015-12-18 NOTE — ED Notes (Signed)
Pt is in stable condition upon d/c and is escorted from ED via wheelchair. 

## 2015-12-18 NOTE — Discharge Instructions (Signed)

## 2015-12-18 NOTE — ED Notes (Signed)
Pt here for a fall last Friday and sts her mid to lower back is hurting and she hasn't had a BM in 5 days. Taking hydrocodone for pain.

## 2015-12-18 NOTE — Telephone Encounter (Signed)
Will watch for ED notes

## 2015-12-18 NOTE — ED Provider Notes (Signed)
CSN: [id number]     Arrival date & time 12/18/15  [id number] History   First MD Initiated Contact with Patient 12/18/15 1122     Chief Complaint  Patient presents with  . Back Pain  . Fall  . Constipation     (Consider location/radiation/quality/duration/timing/severity/associated sxs/prior Treatment) HPI Patient fell 5 days ago. She landed on her left side and back. She continues to have severe pain in her lower and mid back. She has been taking hydrocodone for pain. Been unable of bowel movement now for 5 days. She is also having pain in the left lower abdomen. She reports urinary frequency. No vomiting. She has been ambulatory. Patient was seen by orthopedics on Friday. No suspicion for fracture was identified on a orthopedic evaluation. Her family members contacted her primary care doctor today. Reportedly they were concerned for her multiple complaints and thought she needed evaluation in the emergency department or occult injury or other etiology. Past Medical History  Diagnosis Date  . Allergic rhinitis   . Anxiety   . History of colon cancer   . Depression   . GERD (gastroesophageal reflux disease)   . Hyperlipidemia   . Hypertension   . Osteoarthritis   . Vertigo   . Spinal compression fracture [facility])    Past Surgical History  Procedure Laterality Date  . Colon resection    . Appendectomy    . Cataract extraction    . Tonsillectomy     Family History  Problem Relation Age of Onset  . Prostate cancer Father   . Heart attack Mother   . Gout Mother    Social History  Substance Use Topics  . Smoking status: Never Smoker   . Smokeless tobacco: Never Used  . Alcohol Use: No   OB History    No data available     Review of Systems  10 Systems reviewed and are negative for acute change except as noted in the HPI.   Allergies  Amoxicillin-pot clavulanate  Home Medications   Prior to Admission medications   Medication Sig Start Date End Date Taking? Authorizing  Provider  allopurinol (ZYLOPRIM) 100 MG tablet Take 2 tablets by mouth  daily 12/17/15  Yes Abner Greenspan, MD  ALPRAZolam Duanne Moron) 0.5 MG tablet 1/2 pill by mouth up to twice daily as needed for anxiety Patient taking differently: Take 0.25 mg by mouth 2 (two) times daily as needed for anxiety.  03/19/15  Yes Abner Greenspan, MD  Ascorbic Acid (VITAMIN C) 100 MG tablet Take 100 mg by mouth daily.   Yes Historical Provider, MD  aspirin 81 MG tablet Take 81 mg by mouth daily.     Yes Historical Provider, MD  BIOTIN PO Take 1 tablet by mouth daily.   Yes Historical Provider, MD  calcium-vitamin D (CALCIUM 500 +D) 500 MG tablet Take 1 tablet by mouth daily.    Yes Historical Provider, MD  Docusate Calcium (STOOL SOFTENER PO) Take 1 tablet by mouth daily.   Yes Historical Provider, MD  glucosamine-chondroitin 500-400 MG tablet Take 1 tablet by mouth daily.   Yes Historical Provider, MD  HYDROcodone-acetaminophen (VICODIN) 5-500 MG per tablet Take 1 tablet by mouth every 6 (six) hours as needed.   Yes Historical Provider, MD  losartan (COZAAR) 100 MG tablet Take 1 tablet by mouth  daily 12/17/15  Yes Abner Greenspan, MD  meclizine (ANTIVERT) 25 MG tablet Take 1 tablet (25 mg total) by mouth 3 (three) times daily as needed.  06/28/13  Yes Abner Greenspan, MD  metoprolol tartrate (LOPRESSOR) 25 MG tablet Take 1 tablet by mouth  twice a day 12/17/15  Yes Abner Greenspan, MD  multivitamin [facility]) per tablet Take 1 tablet by mouth daily.     Yes Historical Provider, MD  PARoxetine (PAXIL) 30 MG tablet Take 1 tablet by mouth  every morning 12/17/15  Yes Abner Greenspan, MD  pregabalin (LYRICA) 50 MG capsule Take 50 mg by mouth 2 (two) times daily.    Yes Historical Provider, MD  raloxifene (EVISTA) 60 MG tablet TAKE 1 TABLET BY MOUTH DAILY. 05/09/15  Yes Abner Greenspan, MD  magnesium citrate SOLN Take 148 mLs (0.5 Bottles total) by mouth once. Repeat dose in 6 hours if no bowel movement. 12/18/15   Charlesetta Shanks, MD  Omega-3  Fatty Acids (FISH OIL PO) Take 1 capsule by mouth daily. Reported on 12/18/2015    Historical Provider, MD   BP 131/82 mmHg  Pulse 112  Temp(Src) 98 F (36.7 C) (Oral)  Resp 18  SpO2 93%  LMP 11/02/1980 Physical Exam  Constitutional: She is oriented to person, place, and time. She appears well-developed and well-nourished.  HENT:  Head: Normocephalic and atraumatic.  Eyes: EOM are normal. Pupils are equal, round, and reactive to light.  Neck: Neck supple.  Cardiovascular: Normal rate, regular rhythm, normal heart sounds and intact distal pulses.   Pulmonary/Chest: Effort normal and breath sounds normal.  Abdominal: Soft. Bowel sounds are normal. She exhibits no distension. There is tenderness.  Moderate tenderness left lower quadrant. No guarding.  Genitourinary:  Rectal: One small formed piece of stool in the vault. [place]. No melena. No fecal impaction.  Musculoskeletal: Normal range of motion. She exhibits tenderness. She exhibits no edema.  Patient reports back pain with forward flexion. Normal range of motion of the lower extremities. No pain to palpation of the trochanters.  Neurological: She is alert and oriented to person, place, and time. She has normal strength. Coordination normal. GCS eye subscore is 4. GCS verbal subscore is 5. GCS motor subscore is 6.  Skin: Skin is warm, dry and intact.  Psychiatric: She has a normal mood and affect.    ED Course  Procedures (including critical care time) Labs Review Labs Reviewed  COMPREHENSIVE METABOLIC PANEL - Abnormal; Notable for the following:    Chloride 99 (*)    Glucose, Bld 106 (*)    Total Protein 6.1 (*)    ALT 12 (*)    GFR calc non Af Amer 59 (*)    All other components within normal limits  CBC WITH DIFFERENTIAL/PLATELET    Imaging Review Ct Abdomen Pelvis W Contrast  12/18/2015  CLINICAL DATA:  Left lower abdominal pain and low back pain for 5 days. Two recent falls. EXAM: CT ABDOMEN AND PELVIS WITH CONTRAST  TECHNIQUE: Multidetector CT imaging of the abdomen and pelvis was performed using the standard protocol following bolus administration of intravenous contrast. CONTRAST:  170mL OMNIPAQUE IOHEXOL 300 MG/ML  SOLN COMPARISON:  CT scan dated 10/21/2010 and MRI of the lumbar spine dated 02/24/2011 and thoracic radiographs dated 11/07/2010 FINDINGS: Lower chest: 2 mm slightly irregular nodule in right middle lobe on image 1 of series 3. 5 mm calcified granuloma at the right base laterally. Tortuosity and calcification of the thoracic aorta. Heart size is normal. Hepatobiliary: 2 cm cyst in the anterior aspect of the dome of the right lobe liver. Liver parenchyma and biliary tree are otherwise normal. Pancreas:  Normal. Spleen: Normal. Adrenals/Urinary Tract: Adrenal glands are normal. 16 mm cyst in the mid right kidney. 5 mm cyst in the lower pole the right kidney 5 mm cyst in the mid left kidney. No dilatation of the ureters. Bladder is normal. Head Stomach/Bowel: Small hiatal hernia. Surgical staple line in the mid sigmoid region from previous colon surgery. Otherwise normal. Vascular/Lymphatic: Slight aortic atherosclerosis.  No adenopathy. Reproductive: Normal. Other: No free air or free fluid. Musculoskeletal: There is an acute or subacute benign appearing compression fracture of the T9 vertebral body with slight protrusion of bone into the spinal canal. There are old compression fractures of T12 and L1. Previous vertebroplasty at L1. Bone protrudes into the spinal canal at L1-2 creating severe spinal stenosis. This appears essentially unchanged. Multilevel degenerative facet arthritis in the lumbar spine, stable. IMPRESSION: 1. Acute or subacute benign appearing compression fracture of the T9 vertebral body. 2. No acute abnormalities of the abdomen and pelvis. Electronically Signed   By: Lorriane Shire M.D.   On: 12/18/2015 14:12   I have personally reviewed and evaluated these images and lab results as part of my  medical decision-making.   EKG Interpretation None      MDM   Final diagnoses:  Constipation, unspecified constipation type  Compression fracture of body of thoracic vertebra  Fall, sequela   Patient is alert and well. Persisting pain 5 days after fall. Thoracic compression fracture identified. The patient has had constipation and no bowel movement for 5 days. She has tried MiraLAX. No intra-abdominal trauma by CT scan. No acute surgical etiology. Rectal exam shows no fecal impaction. Patient will be advised to use magnesium citrate and follow up with her family doctor.    Charlesetta Shanks, MD 12/18/15 [phone]

## 2015-12-18 NOTE — Telephone Encounter (Signed)
Patient Name: Michelle Gregory  DOB: 1923-09-14    Initial Comment Caller states her mother is constipated, and has not had a BM in five days. Severe Abdominal pain. Her mother fell last Friday. She's bruised internally.   Nurse Assessment  Nurse: Orvan Seen, RN, [place] Date/Time (Eastern Time): 12/18/2015 8:10:44 AM  Confirm and document reason for call. If symptomatic, describe symptoms. You must click the next button to save text entered. ---Caller states her mother is constipated, and has not had a BM in five days. Severe Abdominal pain. Her mother fell last Friday onto her left side (NO STOMACH INJURY). She's bruised internally.  Has the patient traveled out of the country within the last 30 days? ---No  Does the patient have any new or worsening symptoms? ---Yes  Will a triage be completed? ---Yes  Related visit to physician within the last 2 weeks? ---No  Does the PT have any chronic conditions? (i.e. diabetes, asthma, etc.) ---Yes  List chronic conditions. ---GERD, Anxiety, Depression, HTN, Osteoarthritis.  Is this a behavioral health or substance abuse call? ---No     Guidelines    Guideline Title Affirmed Question Affirmed Notes  Constipation [1] Constant abdominal pain AND [2] present > 2 hours    Final Disposition User   See Physician within 4 Hours (or PCP triage) Orvan Seen, RN, [facility] - ED   Disagree/Comply: Comply

## 2015-12-18 NOTE — Telephone Encounter (Signed)
Patient is currently at [facility] ED for evaluation of symptoms.

## 2015-12-24 ENCOUNTER — Emergency Department (HOSPITAL_COMMUNITY)
Admission: EM | Admit: 2015-12-24 | Discharge: 2015-12-24 | Disposition: A | Discharge status: Home / Self Care | Payer: MEDICARE | Attending: Emergency Medicine | Admitting: Emergency Medicine

## 2015-12-24 ENCOUNTER — Telehealth: Payer: Self-pay | Admitting: Family Medicine

## 2015-12-24 ENCOUNTER — Emergency Department (HOSPITAL_COMMUNITY): Payer: MEDICARE

## 2015-12-24 ENCOUNTER — Telehealth: Payer: Self-pay

## 2015-12-24 ENCOUNTER — Encounter (HOSPITAL_COMMUNITY): Payer: Self-pay | Admitting: Cardiology

## 2015-12-24 DIAGNOSIS — E785 Hyperlipidemia, unspecified: Secondary | ICD-10-CM | POA: Diagnosis not present

## 2015-12-24 DIAGNOSIS — F419 Anxiety disorder, unspecified: Secondary | ICD-10-CM | POA: Diagnosis not present

## 2015-12-24 DIAGNOSIS — Z7982 Long term (current) use of aspirin: Secondary | ICD-10-CM | POA: Insufficient documentation

## 2015-12-24 DIAGNOSIS — Z85038 Personal history of other malignant neoplasm of large intestine: Secondary | ICD-10-CM | POA: Insufficient documentation

## 2015-12-24 DIAGNOSIS — Z791 Long term (current) use of non-steroidal anti-inflammatories (NSAID): Secondary | ICD-10-CM | POA: Diagnosis not present

## 2015-12-24 DIAGNOSIS — K219 Gastro-esophageal reflux disease without esophagitis: Secondary | ICD-10-CM | POA: Insufficient documentation

## 2015-12-24 DIAGNOSIS — R109 Unspecified abdominal pain: Secondary | ICD-10-CM | POA: Diagnosis present

## 2015-12-24 DIAGNOSIS — I10 Essential (primary) hypertension: Secondary | ICD-10-CM | POA: Insufficient documentation

## 2015-12-24 DIAGNOSIS — F329 Major depressive disorder, single episode, unspecified: Secondary | ICD-10-CM | POA: Insufficient documentation

## 2015-12-24 DIAGNOSIS — Z880 Allergy status to penicillin: Secondary | ICD-10-CM | POA: Diagnosis not present

## 2015-12-24 DIAGNOSIS — R197 Diarrhea, unspecified: Secondary | ICD-10-CM | POA: Diagnosis not present

## 2015-12-24 DIAGNOSIS — R1030 Lower abdominal pain, unspecified: Secondary | ICD-10-CM | POA: Diagnosis not present

## 2015-12-24 DIAGNOSIS — N390 Urinary tract infection, site not specified: Secondary | ICD-10-CM | POA: Diagnosis not present

## 2015-12-24 DIAGNOSIS — Z79899 Other long term (current) drug therapy: Secondary | ICD-10-CM | POA: Insufficient documentation

## 2015-12-24 HISTORY — DX: Malignant (primary) neoplasm, unspecified: C80.1

## 2015-12-24 LAB — COMPREHENSIVE METABOLIC PANEL
ALT: 10 U/L — ABNORMAL LOW (ref 14–54)
AST: 18 U/L (ref 15–41)
Albumin: 3.6 g/dL (ref 3.5–5.0)
Alkaline Phosphatase: 67 U/L (ref 38–126)
Anion gap: 12 (ref 5–15)
BUN: 19 mg/dL (ref 6–20)
CO2: 25 mmol/L (ref 22–32)
Calcium: 9.8 mg/dL (ref 8.9–10.3)
Chloride: 102 mmol/L (ref 101–111)
Creatinine, Ser: 0.87 mg/dL (ref 0.44–1.00)
GFR calc Af Amer: >60 mL/min (ref >60)
GFR calc non Af Amer: 57 mL/min — ABNORMAL LOW (ref >60)
Glucose, Bld: 116 mg/dL — ABNORMAL HIGH (ref 65–99)
Potassium: 4.2 mmol/L (ref 3.5–5.1)
Sodium: 139 mmol/L (ref 135–145)
Total Bilirubin: 0.5 mg/dL (ref 0.3–1.2)
Total Protein: 6 g/dL — ABNORMAL LOW (ref 6.5–8.1)

## 2015-12-24 LAB — URINE MICROSCOPIC-ADD ON

## 2015-12-24 LAB — URINALYSIS, ROUTINE W REFLEX MICROSCOPIC
Bilirubin Urine: NEGATIVE
Glucose, UA: NEGATIVE mg/dL
Hgb urine dipstick: NEGATIVE
Ketones, ur: NEGATIVE mg/dL
Nitrite: NEGATIVE
Protein, ur: NEGATIVE mg/dL
Specific Gravity, Urine: 1.013 (ref 1.005–1.030)
pH: 5 (ref 5.0–8.0)

## 2015-12-24 LAB — CBC
HCT: 41.6 % (ref 36.0–46.0)
Hemoglobin: 14 g/dL (ref 12.0–15.0)
MCH: 32.8 pg (ref 26.0–34.0)
MCHC: 33.7 g/dL (ref 30.0–36.0)
MCV: 97.4 fL (ref 78.0–100.0)
Platelets: 228 10*3/uL (ref 150–400)
RBC: 4.27 MIL/uL (ref 3.87–5.11)
RDW: 12.4 % (ref 11.5–15.5)
WBC: 6.5 10*3/uL (ref 4.0–10.5)

## 2015-12-24 LAB — I-STAT CG4 LACTIC ACID, ED: Lactic Acid, Venous: 0.99 mmol/L (ref 0.5–2.0)

## 2015-12-24 LAB — LIPASE, BLOOD: Lipase: 27 U/L (ref 11–51)

## 2015-12-24 MED ORDER — OXYCODONE-ACETAMINOPHEN 5-325 MG PO TABS
1.0000 | ORAL_TABLET | Freq: Once | ORAL | Status: AC
  Administered 2015-12-24: 1 via ORAL

## 2015-12-24 MED ORDER — DEXTROSE 5 % IV SOLN
1.0000 g | Freq: Once | INTRAVENOUS | Status: AC
  Administered 2015-12-24: 1 g via INTRAVENOUS
  Filled 2015-12-24: qty 10

## 2015-12-24 MED ORDER — DIPHENOXYLATE-ATROPINE 2.5-0.025 MG PO TABS: 1.0000 | ORAL_TABLET | Freq: Four times a day (QID) | ORAL | Status: DC | PRN

## 2015-12-24 MED ORDER — SODIUM CHLORIDE 0.9 % IV BOLUS (SEPSIS)
500.0000 mL | Freq: Once | INTRAVENOUS | Status: AC
  Administered 2015-12-24: 500 mL via INTRAVENOUS

## 2015-12-24 MED ORDER — OXYCODONE-ACETAMINOPHEN 5-325 MG PO TABS
ORAL_TABLET | ORAL | Status: AC
  Filled 2015-12-24: qty 1

## 2015-12-24 MED ORDER — OXYCODONE-ACETAMINOPHEN 5-325 MG PO TABS
1.0000 | ORAL_TABLET | Freq: Once | ORAL | Status: AC
  Administered 2015-12-24: 1 via ORAL
  Filled 2015-12-24: qty 1

## 2015-12-24 MED ORDER — IOHEXOL 300 MG/ML  SOLN
100.0000 mL | Freq: Once | INTRAMUSCULAR | Status: AC | PRN
  Administered 2015-12-24: 100 mL via INTRAVENOUS

## 2015-12-24 MED ORDER — CEPHALEXIN 500 MG PO CAPS: 500.0000 mg | ORAL_CAPSULE | Freq: Three times a day (TID) | ORAL | Status: DC

## 2015-12-24 NOTE — ED Provider Notes (Signed)
CSN: [id number]     Arrival date & time 12/24/15  1143 History   First MD Initiated Contact with Patient 12/24/15 1607     Chief Complaint  Patient presents with  . Abdominal Pain  . Diarrhea     HPI  Patient presents for evaluation of abdominal pain and diarrhea.  Significant recent history for a fall and days ago. Seen and evaluated here on 2/15. Compression fracture. Is chronically on Vicodin 4 times to 5 times per day for back pain. Had a compression fracture. Had been constipated for several days as well. Takes MiraLAX regularly. He was prescribed a "new laxative". However, did not have to take the new laxative and she had a bowel movement today after her ER visit. However 2 days later started developing diarrhea. She then stopped her daily MiraLAX has continued diarrhea 4-5 times per day. Complains of abdominal pain today. Triage note states she was "sent here by her home health RN for workup for the abdominal pain". As labs, urine, and CT scan 5 days ago. Has not been on recent antibiotic. Describes her pain is mid abdominal and kind of like burning". No urinary symptoms. Continues to eat and drink well.  Past Medical History  Diagnosis Date  . Allergic rhinitis   . Anxiety   . History of colon cancer   . Depression   . GERD (gastroesophageal reflux disease)   . Hyperlipidemia   . Hypertension   . Osteoarthritis   . Vertigo   . Spinal compression fracture ([place])   . Cancer [facility])    Past Surgical History  Procedure Laterality Date  . Colon resection    . Appendectomy    . Cataract extraction    . Tonsillectomy     Family History  Problem Relation Age of Onset  . Prostate cancer Father   . Heart attack Mother   . Gout Mother    Social History  Substance Use Topics  . Smoking status: Never Smoker   . Smokeless tobacco: Never Used  . Alcohol Use: No   OB History    No data available     Review of Systems  Constitutional: Negative for fever, chills, diaphoresis,  appetite change and fatigue.  HENT: Negative for mouth sores, sore throat and trouble swallowing.   Eyes: Negative for visual disturbance.  Respiratory: Negative for cough, chest tightness, shortness of breath and wheezing.   Cardiovascular: Negative for chest pain.  Gastrointestinal: Positive for abdominal pain and diarrhea. Negative for nausea, vomiting and abdominal distention.  Endocrine: Negative for polydipsia, polyphagia and polyuria.  Genitourinary: Negative for dysuria, frequency and hematuria.  Musculoskeletal: Negative for gait problem.  Skin: Negative for color change, pallor and rash.  Neurological: Negative for dizziness, syncope, light-headedness and headaches.  Hematological: Does not bruise/bleed easily.  Psychiatric/Behavioral: Negative for behavioral problems and confusion.      Allergies  Amoxicillin-pot clavulanate  Home Medications   Prior to Admission medications   Medication Sig Start Date End Date Taking? Authorizing Provider  allopurinol (ZYLOPRIM) 100 MG tablet Take 2 tablets by mouth  daily 12/17/15  Yes Abner Greenspan, MD  Ascorbic Acid (VITAMIN C) 100 MG tablet Take 100 mg by mouth daily.   Yes Historical Provider, MD  aspirin 81 MG tablet Take 81 mg by mouth daily.     Yes Historical Provider, MD  BIOTIN PO Take 1 tablet by mouth daily.   Yes Historical Provider, MD  calcium-vitamin D (CALCIUM 500 +D) 500 MG tablet  Take 1 tablet by mouth daily.    Yes Historical Provider, MD  Docusate Calcium (STOOL SOFTENER PO) Take 1 tablet by mouth daily.   Yes Historical Provider, MD  glucosamine-chondroitin 500-400 MG tablet Take 1 tablet by mouth daily.   Yes Historical Provider, MD  HYDROcodone-acetaminophen (VICODIN) 5-500 MG per tablet Take 1 tablet by mouth See admin instructions. Take 5 tablets by mouth daily per daughter   Yes Historical Provider, MD  losartan (COZAAR) 100 MG tablet Take 1 tablet by mouth  daily 12/17/15  Yes Abner Greenspan, MD  metoprolol  tartrate (LOPRESSOR) 25 MG tablet Take 1 tablet by mouth  twice a day 12/17/15  Yes Abner Greenspan, MD  multivitamin [facility]) per tablet Take 1 tablet by mouth daily.     Yes Historical Provider, MD  PARoxetine (PAXIL) 30 MG tablet Take 1 tablet by mouth  every morning 12/17/15  Yes Abner Greenspan, MD  pregabalin (LYRICA) 50 MG capsule Take 50 mg by mouth 2 (two) times daily.    Yes Historical Provider, MD  raloxifene (EVISTA) 60 MG tablet TAKE 1 TABLET BY MOUTH DAILY. 05/09/15  Yes Abner Greenspan, MD  ALPRAZolam Duanne Moron) 0.5 MG tablet 1/2 pill by mouth up to twice daily as needed for anxiety Patient not taking: Reported on 12/24/2015 03/19/15   Abner Greenspan, MD  cephALEXin (KEFLEX) 500 MG capsule Take 1 capsule (500 mg total) by mouth 3 (three) times daily. 12/24/15   Tanna Furry, MD  diphenoxylate-atropine (LOMOTIL) 2.5-0.025 MG tablet Take 1 tablet by mouth 4 (four) times daily as needed for diarrhea or loose stools. 12/24/15   Tanna Furry, MD  magnesium citrate SOLN Take 148 mLs (0.5 Bottles total) by mouth once. Repeat dose in 6 hours if no bowel movement. Patient not taking: Reported on 12/24/2015 12/18/15   Charlesetta Shanks, MD  meclizine (ANTIVERT) 25 MG tablet Take 1 tablet (25 mg total) by mouth 3 (three) times daily as needed. Patient not taking: Reported on 12/24/2015 06/28/13   Abner Greenspan, MD   BP 179/80 mmHg  Pulse 104  Temp(Src) 98.1 F (36.7 C) (Oral)  Resp 18  Wt 157 lb (71.215 kg)  SpO2 93%  LMP 11/02/1980 Physical Exam  Constitutional: She is oriented to person, place, and time. She appears well-developed and well-nourished. No distress.  HENT:  Head: Normocephalic.  Eyes: Conjunctivae are normal. Pupils are equal, round, and reactive to light. No scleral icterus.  Neck: Normal range of motion. Neck supple. No thyromegaly present.  Cardiovascular: Normal rate and regular rhythm.  Exam reveals no gallop and no friction rub.   No murmur heard. Pulmonary/Chest: Effort normal and  breath sounds normal. No respiratory distress. She has no wheezes. She has no rales.  Abdominal: Soft. Bowel sounds are normal. She exhibits no distension. There is no tenderness. There is no rebound.  Cannot reproduce her tenderness on exam. She is uncertain she's having pain currently.  Musculoskeletal: Normal range of motion.  Neurological: She is alert and oriented to person, place, and time.  Skin: Skin is warm and dry. No rash noted.  Psychiatric: She has a normal mood and affect. Her behavior is normal.    ED Course  Procedures (including critical care time) Labs Review Labs Reviewed  COMPREHENSIVE METABOLIC PANEL - Abnormal; Notable for the following:    Glucose, Bld 116 (*)    Total Protein 6.0 (*)    ALT 10 (*)    GFR calc non Af Wyvonnia Lora  57 (*)    All other components within normal limits  URINALYSIS, ROUTINE W REFLEX MICROSCOPIC (NOT AT [facility]) - Abnormal; Notable for the following:    APPearance HAZY (*)    Leukocytes, UA LARGE (*)    All other components within normal limits  URINE MICROSCOPIC-ADD ON - Abnormal; Notable for the following:    Squamous Epithelial / LPF 6-30 (*)    Bacteria, UA FEW (*)    All other components within normal limits  C DIFFICILE QUICK SCREEN W PCR REFLEX  LIPASE, BLOOD  CBC  I-STAT CG4 LACTIC ACID, ED    Imaging Review Ct Abdomen Pelvis W Contrast  12/24/2015  CLINICAL DATA:  Lower abdominal pain for 6 days. EXAM: CT ABDOMEN AND PELVIS WITH CONTRAST TECHNIQUE: Multidetector CT imaging of the abdomen and pelvis was performed using the standard protocol following bolus administration of intravenous contrast. CONTRAST:  168mL OMNIPAQUE IOHEXOL 300 MG/ML  SOLN COMPARISON:  12/18/2015 FINDINGS: Cyst in the left lobe of the liver is stable. Gallbladder, spleen, and adrenal glands are within normal limits. Pancreas is atrophic Renal hypodensities are stable. Bladder, uterus, and adnexa are within normal limits. Moderate stool burden throughout the  colon. No evidence of bowel obstruction. Postoperative changes from bowel resection and reanastomosis in the sigmoid colon are present. T9, T12, and L2 compression fractures are again noted. There is marked retropulsion at the L2 compression fracture with severe spinal stenosis. The T12 compression fracture is chronic. There has been further loss of height at the T9 compression fracture. There is persistent retropulsion of the superior endplate. IMPRESSION: No acute intra abdominal or intrapelvic pathology. Multiple thoracic and lumbar compression fractures are again noted as described above. There has been further loss of height at the T9 compression fracture Electronically Signed   By: Marybelle Killings M.D.   On: 12/24/2015 18:47   I have personally reviewed and evaluated these images and lab results as part of my medical decision-making.   EKG Interpretation None      MDM   Final diagnoses:  UTI (lower urinary tract infection)  Diarrhea, unspecified type    Lasix was suggest UTI. No acute changes on CT. I recommended fiber for her diarrhea and constipation. Lomotil as needed for excessive diarrhea. Keflex prescription. Given IV Rocephin for UTI. Culture pending. Appropriate for discharge.    Tanna Furry, MD 12/24/15 [phone]

## 2015-12-24 NOTE — Telephone Encounter (Signed)
If she or her family think she is no longer safe to live alone or if they cannot assure her safety/provide all the care she needs at home-then it probably is time  This can happen at any age for any person depending on their physical and/or mental capabilities  (in addition to diet needs/ incontinence / etc) If they want to discuss it further -f/u with she and a family member   My best advice is for them to start visiting some assisted living facilities close to them to see how they like them and how insurance will help/etc  If they decide to go with a certain one - just bring me the paperwork they need (form called FL2) and I will do my part

## 2015-12-24 NOTE — Telephone Encounter (Signed)
V/M left that pt needs to be in assisted living or some type facility; request Dr Marliss Coots opinion and request cb  Medicare wellness 04/2015.

## 2015-12-24 NOTE — Discharge Instructions (Signed)
Push fluids, stay hydrated. Lomotil, as needed if you're having diarrhea more than 2-3 episodes per day. Keflex, antibiotic as prescribed. Follow up with your primary care physician.   Diarrhea Diarrhea is watery poop (stool). It can make you feel weak, tired, thirsty, or give you a dry mouth (signs of dehydration). Watery poop is a sign of another problem, most often an infection. It often lasts 2-3 days. It can last longer if it is a sign of something serious. Take care of yourself as told by your doctor. HOME CARE   Drink 1 cup (8 ounces) of fluid each time you have watery poop.  Do not drink the following fluids:  Those that contain simple sugars (fructose, glucose, galactose, lactose, sucrose, maltose).  Sports drinks.  Fruit juices.  Whole milk products.  Sodas.  Drinks with caffeine (coffee, tea, soda) or alcohol.  Oral rehydration solution may be used if the doctor says it is okay. You may make your own solution. Follow this recipe:   - teaspoon table salt.   teaspoon baking soda.   teaspoon salt substitute containing potassium chloride.  1 tablespoons sugar.  1 liter (34 ounces) of water.  Avoid the following foods:  High fiber foods, such as raw fruits and vegetables.  Nuts, seeds, and whole grain breads and cereals.   Those that are sweetened with sugar alcohols (xylitol, sorbitol, mannitol).  Try eating the following foods:  Starchy foods, such as rice, toast, pasta, low-sugar cereal, oatmeal, baked potatoes, crackers, and bagels.  Bananas.  Applesauce.  Eat probiotic-rich foods, such as yogurt and milk products that are fermented.  Wash your hands well after each time you have watery poop.  Only take medicine as told by your doctor.  Take a warm bath to help lessen burning or pain from having watery poop. GET HELP RIGHT AWAY IF:   You cannot drink fluids without throwing up (vomiting).  You keep throwing up.  You have blood in your  poop, or your poop looks black and tarry.  You do not pee (urinate) in 6-8 hours, or there is only a small amount of very dark pee.  You have belly (abdominal) pain that gets worse or stays in the same spot (localizes).  You are weak, dizzy, confused, or light-headed.  You have a very bad headache.  Your watery poop gets worse or does not get better.  You have a fever or lasting symptoms for more than 2-3 days.  You have a fever and your symptoms suddenly get worse. MAKE SURE YOU:   Understand these instructions.  Will watch your condition.  Will get help right away if you are not doing well or get worse.   This information is not intended to replace advice given to you by your health care provider. Make sure you discuss any questions you have with your health care provider.   Document Released: 04/06/2008 Document Revised: 11/09/2014 Document Reviewed: 06/26/2012 Elsevier Interactive Patient Education 2016 Elsevier Inc.  Urinary Tract Infection Urinary tract infections (UTIs) can develop anywhere along your urinary tract. Your urinary tract is your body's drainage system for removing wastes and extra water. Your urinary tract includes two kidneys, two ureters, a bladder, and a urethra. Your kidneys are a pair of bean-shaped organs. Each kidney is about the size of your fist. They are located below your ribs, one on each side of your spine. CAUSES Infections are caused by microbes, which are microscopic organisms, including fungi, viruses, and bacteria. These organisms are  so small that they can only be seen through a microscope. Bacteria are the microbes that most commonly cause UTIs. SYMPTOMS  Symptoms of UTIs may vary by age and gender of the patient and by the location of the infection. Symptoms in young women typically include a frequent and intense urge to urinate and a painful, burning feeling in the bladder or urethra during urination. Older women and men are more likely to  be tired, shaky, and weak and have muscle aches and abdominal pain. A fever may mean the infection is in your kidneys. Other symptoms of a kidney infection include pain in your back or sides below the ribs, nausea, and vomiting. DIAGNOSIS To diagnose a UTI, your caregiver will ask you about your symptoms. Your caregiver will also ask you to provide a urine sample. The urine sample will be tested for bacteria and white blood cells. White blood cells are made by your body to help fight infection. TREATMENT  Typically, UTIs can be treated with medication. Because most UTIs are caused by a bacterial infection, they usually can be treated with the use of antibiotics. The choice of antibiotic and length of treatment depend on your symptoms and the type of bacteria causing your infection. HOME CARE INSTRUCTIONS  If you were prescribed antibiotics, take them exactly as your caregiver instructs you. Finish the medication even if you feel better after you have only taken some of the medication.  Drink enough water and fluids to keep your urine clear or pale yellow.  Avoid caffeine, tea, and carbonated beverages. They tend to irritate your bladder.  Empty your bladder often. Avoid holding urine for long periods of time.  Empty your bladder before and after sexual intercourse.  After a bowel movement, women should cleanse from front to back. Use each tissue only once. SEEK MEDICAL CARE IF:   You have back pain.  You develop a fever.  Your symptoms do not begin to resolve within 3 days. SEEK IMMEDIATE MEDICAL CARE IF:   You have severe back pain or lower abdominal pain.  You develop chills.  You have nausea or vomiting.  You have continued burning or discomfort with urination. MAKE SURE YOU:   Understand these instructions.  Will watch your condition.  Will get help right away if you are not doing well or get worse.   This information is not intended to replace advice given to you by  your health care provider. Make sure you discuss any questions you have with your health care provider.   Document Released: 07/29/2005 Document Revised: 07/10/2015 Document Reviewed: 11/27/2011 Elsevier Interactive Patient Education [employer].

## 2015-12-24 NOTE — Telephone Encounter (Signed)
[place] Patient Name: Michelle Gregory DOB: 26-Sep-1923 Initial Comment Caller states mother has diarrhea for 3 days; concerned about dehydration; home nurse comes in and today adv may have C-diff; Nurse Assessment Nurse: Markus Daft, RN, Sherre Poot Date/Time (Eastern Time): 12/24/2015 10:39:40 AM Confirm and document reason for call. If symptomatic, describe symptoms. You must click the next button to save text entered. ---Caller states mother has diarrhea for 3 days, and concerned about dehydration. Home nurse comes in and today advised that pt may have C-diff. -- Caller had me speak with nurse, Gilmore Laroche LPN with [facility], states that she doesn't look dehydrated, but might be starting with s/s. BP 150/90, HR 65, Resp 14/min. No fever. Has the patient traveled out of the country within the last 30 days? ---No Does the patient have any new or worsening symptoms? ---Yes Will a triage be completed? ---Yes Related visit to physician within the last 2 weeks? ---No Does the PT have any chronic conditions? (i.e. diabetes, asthma, etc.) ---Yes List chronic conditions. ---HTN, (no h/o c-diff and has not been on antibiotics recently) Is this a behavioral health or substance abuse call? ---No Guidelines Guideline Title Affirmed Question Affirmed Notes Diarrhea [1] SEVERE abdominal pain AND [2] age > 43 Final Disposition User Go to ED Now Markus Daft, RN, Windy Comments Cell # for dtr [phone] Referrals [facility] - ED Disagree/Comply: Comply

## 2015-12-24 NOTE — Telephone Encounter (Signed)
Left message for Michelle Gregory to return call.

## 2015-12-24 NOTE — Telephone Encounter (Signed)
Agree with advisement  Will watch for records

## 2015-12-24 NOTE — ED Notes (Signed)
Pt reports abd pain and diarrhea for the past 3 days. Reports home health RN sent her here for work up for the abd pain.

## 2015-12-30 ENCOUNTER — Encounter: Payer: Self-pay | Admitting: Family Medicine

## 2015-12-30 ENCOUNTER — Ambulatory Visit (INDEPENDENT_AMBULATORY_CARE_PROVIDER_SITE_OTHER): Payer: MEDICARE | Admitting: Family Medicine

## 2015-12-30 VITALS — BP 126/70 | HR 78 | Temp 97.3°F | Ht 61.0 in | Wt 145.2 lb

## 2015-12-30 DIAGNOSIS — Z87440 Personal history of urinary (tract) infections: Secondary | ICD-10-CM

## 2015-12-30 DIAGNOSIS — M5136 Other intervertebral disc degeneration, lumbar region: Secondary | ICD-10-CM | POA: Diagnosis not present

## 2015-12-30 DIAGNOSIS — N390 Urinary tract infection, site not specified: Secondary | ICD-10-CM

## 2015-12-30 DIAGNOSIS — IMO0002 Reserved for concepts with insufficient information to code with codable children: Secondary | ICD-10-CM | POA: Insufficient documentation

## 2015-12-30 DIAGNOSIS — R194 Change in bowel habit: Secondary | ICD-10-CM | POA: Diagnosis not present

## 2015-12-30 DIAGNOSIS — J45909 Unspecified asthma, uncomplicated: Secondary | ICD-10-CM

## 2015-12-30 DIAGNOSIS — T148 Other injury of unspecified body region: Secondary | ICD-10-CM

## 2015-12-30 LAB — POC URINALSYSI DIPSTICK (AUTOMATED)
Bilirubin, UA: NEGATIVE
Blood, UA: NEGATIVE
Glucose, UA: NEGATIVE
Ketones, UA: NEGATIVE
Leukocytes, UA: NEGATIVE
Nitrite, UA: NEGATIVE
Protein, UA: NEGATIVE
Spec Grav, UA: 1.025
Urobilinogen, UA: 0.2
pH, UA: 5.5

## 2015-12-30 NOTE — Telephone Encounter (Signed)
Family scheduled appt for this morning

## 2015-12-30 NOTE — Assessment & Plan Note (Signed)
Now additional worsening of T9 comp fx Will f/u with orthopedics

## 2015-12-30 NOTE — Assessment & Plan Note (Signed)
No wheeze or abn findings on today's exam ? If related to pollen/outdoor time Will watch for cough or fever  Update if symptoms return

## 2015-12-30 NOTE — Assessment & Plan Note (Signed)
Reviewed CT scan today- with several comp fx - the one at T9 is worse since her fall and causing her more pain (in addition to her spinal stenosis) They will make an earlier ortho appt to rev any addn treatment options  Taking ca and D and evista One accidental fall- otherwise no hx of falls  Long disc re: help and home and need for care - family has a good set up for safety  I would recommend use of a walker -which she is agreeable to

## 2015-12-30 NOTE — Progress Notes (Signed)
Subjective:                          Patient ID: Michelle Gregory, female    DOB: 02-27-1923, 82 y.o.   MRN: [id number]  HPI Here for multiple medical problems   Has been to [facility] twice - on 2/15 and then 2/21 First for falls Then uti  Constipation/diarrhea     Had Ct  CT Abdomen Pelvis W Contrast   Status: Final result       PACS Images     Show images for CT Abdomen Pelvis W Contrast     Study Result     CLINICAL DATA: Lower abdominal pain for 6 days.  EXAM: CT ABDOMEN AND PELVIS WITH CONTRAST  TECHNIQUE: Multidetector CT imaging of the abdomen and pelvis was performed using the standard protocol following bolus administration of intravenous contrast.  CONTRAST: 134mL OMNIPAQUE IOHEXOL 300 MG/ML SOLN  COMPARISON: 12/18/2015  FINDINGS: Cyst in the left lobe of the liver is stable.  Gallbladder, spleen, and adrenal glands are within normal limits.  Pancreas is atrophic  Renal hypodensities are stable.  Bladder, uterus, and adnexa are within normal limits.  Moderate stool burden throughout the colon. No evidence of bowel obstruction. Postoperative changes from bowel resection and reanastomosis in the sigmoid colon are present.  T9, T12, and L2 compression fractures are again noted. There is marked retropulsion at the L2 compression fracture with severe spinal stenosis. The T12 compression fracture is chronic. There has been further loss of height at the T9 compression fracture. There is persistent retropulsion of the superior endplate.  IMPRESSION: No acute intra abdominal or intrapelvic pathology.  Multiple thoracic and lumbar compression fractures are again noted as described above. There has been further loss of height at the T9 compression fracture   Electronically Signed  By: Marybelle Killings M.D.  On: 12/24/2015 18:47   And she has spinal stenosis  Sees orthopedics  Her pain is not as terrible / still there    Sees ortho again in June   She suspects her fall was from uti  Only fall she has had in a long time   Had constipation initially from narcotic and then diarrhea from miralax  Still taking narcotic pain med Stopped miralax and stool softener  Fiber does not help   Was tx with keflex - for uti  ua is clear today Results for orders placed or performed in visit on 12/30/15  POCT Urinalysis Dipstick (Automated)  Result Value Ref Range   Color, UA Yellow    Clarity, UA Hazy    Glucose, UA Neg.    Bilirubin, UA Neg.    Ketones, UA Neg.    Spec Grav, UA 1.025    Blood, UA Neg.    pH, UA 5.5    Protein, UA Neg.    Urobilinogen, UA 0.2    Nitrite, UA Neg.    Leukocytes, UA Negative Negative   pending cx Has several more days   There were some concerns about safety at home  Family got a nurse coming in twice a week - to help primarily with bathing  Family doses her medications  Daughter lives next door  Pt states she feels safe most of the time unless she has diarrhea in the middle of the night -it frightens her more   Just started wheezing the past few days  No other symptoms  Nose runs all the time  No cough  or fever Pulse ox and vitals are good    Patient Active Problem List   Diagnosis Date Noted  . Bowel habit changes 12/30/2015  . Compression fracture 12/30/2015  . Excessive cerumen in right ear canal 05/27/2015  . Ingrown toenail without infection 05/04/2015  . Routine general medical examination at a health care facility 04/09/2015  . Viral URI with cough 03/19/2015  . UTI (urinary tract infection) 05/09/2014  . Candidal intertrigo 03/28/2014  . Excessive cerumen in both ear canals 01/17/2014  . Encounter for Medicare annual wellness exam 03/21/2013  . Gout 09/19/2012  . Osteopenia 05/05/2011  . Degenerative lumbar disc 01/20/2011  . VARICOSE VEINS, LOWER EXTREMITIES 09/04/2010  . Hyperglycemia 01/08/2010  . Hyperlipidemia 05/09/2008  . GOUT 05/09/2008  .  Anxiety state 05/09/2008  . Essential hypertension 05/09/2008  . HEMORRHOIDS, INTERNAL 05/09/2008  . Allergic rhinitis 05/09/2008  . Mild reactive airways disease 05/09/2008  . GERD 05/09/2008  . IBS 05/09/2008  . OSTEOARTHRITIS 05/09/2008  . COLON CANCER, HX OF 05/09/2008   Past Medical History  Diagnosis Date  . Allergic rhinitis   . Anxiety   . History of colon cancer   . Depression   . GERD (gastroesophageal reflux disease)   . Hyperlipidemia   . Hypertension   . Osteoarthritis   . Vertigo   . Spinal compression fracture ([place])   . Cancer [facility])    Past Surgical History  Procedure Laterality Date  . Colon resection    . Appendectomy    . Cataract extraction    . Tonsillectomy     Social History  Substance Use Topics  . Smoking status: Never Smoker   . Smokeless tobacco: Never Used  . Alcohol Use: No   Family History  Problem Relation Age of Onset  . Prostate cancer Father   . Heart attack Mother   . Gout Mother    Allergies  Allergen Reactions  . Amoxicillin-Pot Clavulanate     REACTION: diarrhea   Current Outpatient Prescriptions on File Prior to Visit  Medication Sig Dispense Refill  . allopurinol (ZYLOPRIM) 100 MG tablet Take 2 tablets by mouth  daily 180 tablet 1  . ALPRAZolam (XANAX) 0.5 MG tablet 1/2 pill by mouth up to twice daily as needed for anxiety 30 tablet 1  . Ascorbic Acid (VITAMIN C) 100 MG tablet Take 100 mg by mouth daily.    Marland Kitchen aspirin 81 MG tablet Take 81 mg by mouth daily.      Marland Kitchen BIOTIN PO Take 1 tablet by mouth daily.    . calcium-vitamin D (CALCIUM 500 +D) 500 MG tablet Take 1 tablet by mouth daily.     . cephALEXin (KEFLEX) 500 MG capsule Take 1 capsule (500 mg total) by mouth 3 (three) times daily. 20 capsule 0  . diphenoxylate-atropine (LOMOTIL) 2.5-0.025 MG tablet Take 1 tablet by mouth 4 (four) times daily as needed for diarrhea or loose stools. 30 tablet 0  . Docusate Calcium (STOOL SOFTENER PO) Take 1 tablet by mouth daily.      Marland Kitchen glucosamine-chondroitin 500-400 MG tablet Take 1 tablet by mouth daily.    Marland Kitchen HYDROcodone-acetaminophen (VICODIN) 5-500 MG per tablet Take 1 tablet by mouth See admin instructions. Take 5 tablets by mouth daily per daughter    . losartan (COZAAR) 100 MG tablet Take 1 tablet by mouth  daily 90 tablet 1  . magnesium citrate SOLN Take 148 mLs (0.5 Bottles total) by mouth once. Repeat dose in 6 hours if no  bowel movement. 195 mL 1  . meclizine (ANTIVERT) 25 MG tablet Take 1 tablet (25 mg total) by mouth 3 (three) times daily as needed. 90 tablet 3  . metoprolol tartrate (LOPRESSOR) 25 MG tablet Take 1 tablet by mouth  twice a day 180 tablet 1  . multivitamin (THERAGRAN) per tablet Take 1 tablet by mouth daily.      Marland Kitchen PARoxetine (PAXIL) 30 MG tablet Take 1 tablet by mouth  every morning 90 tablet 1  . pregabalin (LYRICA) 50 MG capsule Take 50 mg by mouth 2 (two) times daily.     . raloxifene (EVISTA) 60 MG tablet TAKE 1 TABLET BY MOUTH DAILY. 30 tablet 5   No current facility-administered medications on file prior to visit.      Review of Systems    Review of Systems  Constitutional: Negative for fever, appetite change,  and unexpected weight change.  Eyes: Negative for pain and visual disturbance.  Respiratory: Negative for cough and shortness of breath.  pos for intermittent wheezing  Cardiovascular: Negative for cp or palpitations   neg for PND or orthopnea or leg swelling  Gastrointestinal: Negative for nausea, vomiting or abd pain or blood in stool.  Genitourinary: Negative for urgency and frequency. neg for dysuria or hematuria  Skin: Negative for pallor or rash   MSK pos for acute on chronic back pain  Neurological: Negative for weakness, light-headedness, numbness and headaches.  Hematological: Negative for adenopathy. Does not bruise/bleed easily.  Psychiatric/Behavioral: Negative for dysphoric mood. The patient is occ nervous/anxious.      Objective:   Physical Exam   Constitutional: She appears well-developed and well-nourished. No distress.  Frail appearing elderly female -not acutely ill   HENT:  Head: Normocephalic and atraumatic.  Right Ear: External ear normal.  Left Ear: External ear normal.  Mouth/Throat: Oropharynx is clear and moist.  Nares are boggy Scant rhinorrhea   Eyes: Conjunctivae and EOM are normal. Pupils are equal, round, and reactive to light.  Neck: Normal range of motion. Neck supple. No JVD present. Carotid bruit is not present. No thyromegaly present.  Cardiovascular: Normal rate, regular rhythm, normal heart sounds and intact distal pulses.  Exam reveals no gallop.   Pulmonary/Chest: Effort normal and breath sounds normal. No respiratory distress. She has no wheezes. She has no rales. She exhibits no tenderness.  No crackles  No wheeze even on forced exp today  Good air exch   Abdominal: Soft. Bowel sounds are normal. She exhibits no distension, no abdominal bruit and no mass. There is no tenderness.  Musculoskeletal: She exhibits no edema.  Kyphosis noted  Tender over upper lumbar spine with limited rom  Nl rom hips   Lymphadenopathy:    She has no cervical adenopathy.  Neurological: She is alert. She has normal reflexes.  Skin: Skin is warm and dry. No rash noted. No erythema. No pallor.  Psychiatric: She has a normal mood and affect.  Mentally sharp and attentive and in a good mood Helpful family member present           Assessment & Plan:   Problem List Items Addressed This Visit      Respiratory   Mild reactive airways disease    No wheeze or abn findings on today's exam ? If related to pollen/outdoor time Will watch for cough or fever  Update if symptoms return         Musculoskeletal and Integument   Compression fracture    Reviewed CT  scan today- with several comp fx - the one at T9 is worse since her fall and causing her more pain (in addition to her spinal stenosis) They will make an earlier  ortho appt to rev any addn treatment options  Taking ca and D and evista One accidental fall- otherwise no hx of falls  Long disc re: help and home and need for care - family has a good set up for safety  I would recommend use of a walker -which she is agreeable to       Degenerative lumbar disc    Now additional worsening of T9 comp fx Will f/u with orthopedics         Genitourinary   UTI (urinary tract infection)    Rev ED notes and details -this caused her initial fall ua clear today on keflex-which she will finish soon Disc imp of water intake  Is able to get to the bathroom on her own  Update if symptoms return Still pending cx from ED         Other   Bowel habit changes    First constipation from chronic narcotics (for spinal stenosis) Then miralax and stool softeners caused diarrhea Rev CT - had large amt of fecal material - expect this will take time to clear out (still having loose stools)  Do not recommend diarrhea med Consider C diff testing if it continues  Otherwise -recommend a regimen of one dose of miralax every time she has no bm in 2 d -they can titrate to effect Update if not starting to improve in a week or if worsening         Other Visit Diagnoses    History of UTI    -  Primary    Relevant Orders    POCT Urinalysis Dipstick (Automated) (Completed)

## 2015-12-30 NOTE — Assessment & Plan Note (Signed)
Rev ED notes and details -this caused her initial fall ua clear today on keflex-which she will finish soon Disc imp of water intake  Is able to get to the bathroom on her own  Update if symptoms return Still pending cx from ED

## 2015-12-30 NOTE — Patient Instructions (Signed)
Stop metamucil if it is not helping Use 1 dose of miralax every time you don't move bowels for 2 days (forget the stool softener) It may take longer to evacuate bowels (due to narcotics)  Once the keflex is done this will also help diarrhea  Move up your ortho appointment to sooner - since you have the worsened T9 compression fracture -- take the CT scan copy with you uti appears to be getting better - continue/finish the antibiotic  If wheezing worsens let me know- lungs sound good today  If you don't feel safe living alone - then start looking at assisted living places / retirement communities  Use your walker when you feel unsteady (instead of the cane)

## 2015-12-30 NOTE — Progress Notes (Signed)
Pre visit review using our clinic review tool, if applicable. No additional management support is needed unless otherwise documented below in the visit note. 

## 2015-12-30 NOTE — Assessment & Plan Note (Signed)
First constipation from chronic narcotics (for spinal stenosis) Then miralax and stool softeners caused diarrhea Rev CT - had large amt of fecal material - expect this will take time to clear out (still having loose stools)  Do not recommend diarrhea med Consider C diff testing if it continues  Otherwise -recommend a regimen of one dose of miralax every time she has no bm in 2 d -they can titrate to effect Update if not starting to improve in a week or if worsening

## 2016-01-09 DIAGNOSIS — Z79891 Long term (current) use of opiate analgesic: Secondary | ICD-10-CM | POA: Diagnosis not present

## 2016-01-09 DIAGNOSIS — M5136 Other intervertebral disc degeneration, lumbar region: Secondary | ICD-10-CM | POA: Diagnosis not present

## 2016-01-09 DIAGNOSIS — G894 Chronic pain syndrome: Secondary | ICD-10-CM | POA: Diagnosis not present

## 2016-01-14 ENCOUNTER — Other Ambulatory Visit: Payer: Self-pay | Admitting: Family Medicine

## 2016-02-17 ENCOUNTER — Encounter: Payer: Self-pay | Admitting: Family Medicine

## 2016-02-17 ENCOUNTER — Ambulatory Visit (INDEPENDENT_AMBULATORY_CARE_PROVIDER_SITE_OTHER): Payer: MEDICARE | Admitting: Family Medicine

## 2016-02-17 VITALS — BP 122/84 | HR 97 | Temp 98.0°F | Ht 61.0 in | Wt 145.6 lb

## 2016-02-17 DIAGNOSIS — R4182 Altered mental status, unspecified: Secondary | ICD-10-CM | POA: Diagnosis not present

## 2016-02-17 DIAGNOSIS — R3915 Urgency of urination: Secondary | ICD-10-CM | POA: Diagnosis not present

## 2016-02-17 LAB — POCT URINALYSIS DIPSTICK
Bilirubin, UA: NEGATIVE
Blood, UA: NEGATIVE
Glucose, UA: NEGATIVE
Ketones, UA: NEGATIVE
Nitrite, UA: NEGATIVE
Protein, UA: NEGATIVE
Spec Grav, UA: 1.015
Urobilinogen, UA: 0.2
pH, UA: 6

## 2016-02-17 LAB — URINALYSIS, MICROSCOPIC ONLY

## 2016-02-17 NOTE — Progress Notes (Signed)
Pre visit review using our clinic review tool, if applicable. No additional management support is needed unless otherwise documented below in the visit note. 

## 2016-02-17 NOTE — Progress Notes (Signed)
Patient ID: [name], female   DOB: [birth year], 82 y.o.   MRN: [id number]  Tommi Rumps, MD Phone: [phone]  Michelle Gregory is a 82 y.o. female who presents today for same-day visit.  Patient presents with her daughter today. The patient notes no specific complaints. The daughter felt as though the patient may be at the start of a UTI. Note she slept a lot yesterday which the daughter reports the patient does not usually do. The daughter also reports that the patient slept on the couch and may have gotten her days and nights mixed up. To both of these things the patient replies with the answer that she slept because she was tired and feels fine at this time. Patient denies confusion. She denies dysuria and frequency. Some urgency that is near her baseline. No hematuria. No vaginal discharge. No abdominal pain. No fevers. Patient is oriented to person place and month. Not oriented to year.  PMH: nonsmoker.   ROS see history of present illness  Objective  Physical Exam Filed Vitals:   02/17/16 1316  BP: 122/84  Pulse: 97  Temp: 98 F (36.7 C)    BP Readings from Last 3 Encounters:  02/17/16 122/84  12/30/15 126/70  12/24/15 136/68   Wt Readings from Last 3 Encounters:  02/17/16 145 lb 9.6 oz (66.044 kg)  12/30/15 145 lb 4 oz (65.885 kg)  12/24/15 157 lb (71.215 kg)    Physical Exam  Constitutional: She is well-developed, well-nourished, and in no distress.  HENT:  Head: Normocephalic and atraumatic.  Cardiovascular: Normal rate, regular rhythm and normal heart sounds.   Pulmonary/Chest: Effort normal and breath sounds normal.  Abdominal: Soft. Bowel sounds are normal. She exhibits no distension. There is no tenderness. There is no rebound and no guarding.  Musculoskeletal: She exhibits no edema.  Neurological: She is alert. Gait normal.  CN 2-12 intact, 5/5 strength in bilateral biceps, triceps, grip, quads, hamstrings, plantar and dorsiflexion, sensation to light  touch intact in bilateral UE and LE, normal gait, absent patellar reflexes  Skin: Skin is warm and dry. She is not diaphoretic.     Assessment/Plan: Please see individual problem list.  Urinary urgency Patient presents with her daughter for concern for UTI. Daughter was concerned that the patient may have been confused and that she may have a UTI leading to this. Patient does not endorse any confusion. Patient is well-appearing at this time. Patient denies confusion. She is alert and oriented to person place and time with the exception of the year. She reports minimal urinary urgency. No frequency or dysuria. No abdominal pain. No vaginal symptoms. UA was obtained that had 1+ leukocytes though no other abnormalities indicating UTI. Discussed options of just treating for UTI though with lack of significant symptoms and significant findings on the UA I would prefer to hold off until urine culture returns and patient and daughter agreed. Patient and daughter will continue to monitor symptoms. If confusion returns or worsens or she develops any new symptoms they will seek medical attention. Given return precautions.    Orders Placed This Encounter  Procedures  . Urine Culture  . Urine Microscopic Only  . POCT Urinalysis Dipstick    Tommi Rumps, MD [facility]

## 2016-02-17 NOTE — Assessment & Plan Note (Addendum)
Patient presents with her daughter for concern for UTI. Daughter was concerned that the patient may have been confused and that she may have a UTI leading to this. Patient does not endorse any confusion. Patient is well-appearing at this time. Patient denies confusion. She is alert and oriented to person place and time with the exception of the year. She reports minimal urinary urgency. No frequency or dysuria. No abdominal pain. No vaginal symptoms. UA was obtained that had 1+ leukocytes though no other abnormalities indicating UTI. Discussed options of just treating for UTI though with lack of significant symptoms and significant findings on the UA I would prefer to hold off until urine culture returns and patient and daughter agreed. Patient and daughter will continue to monitor symptoms. If confusion returns or worsens or she develops any new symptoms they will seek medical attention. Given return precautions.

## 2016-02-17 NOTE — Patient Instructions (Addendum)
Nice to meet you. Please monitor for recurrence of confusion and worsening confusion. We'll send her urine for culture and call you if there is an infection. If you develop confusion, fever, abdominal pain, burning with urination, or any new or changing symptoms please seek medical attention.

## 2016-02-18 LAB — URINE CULTURE: Colony Count: 30000

## 2016-04-02 ENCOUNTER — Ambulatory Visit (INDEPENDENT_AMBULATORY_CARE_PROVIDER_SITE_OTHER): Payer: MEDICARE | Admitting: Primary Care

## 2016-04-02 ENCOUNTER — Ambulatory Visit: Payer: Self-pay | Admitting: Primary Care

## 2016-04-02 VITALS — BP 116/70 | HR 87 | Temp 98.0°F | Ht 61.0 in | Wt 137.8 lb

## 2016-04-02 DIAGNOSIS — N390 Urinary tract infection, site not specified: Secondary | ICD-10-CM

## 2016-04-02 LAB — POC URINALSYSI DIPSTICK (AUTOMATED)
Bilirubin, UA: NEGATIVE
Blood, UA: NEGATIVE
Glucose, UA: NEGATIVE
Ketones, UA: NEGATIVE
Nitrite, UA: NEGATIVE
Protein, UA: NEGATIVE
Spec Grav, UA: 1.025
Urobilinogen, UA: NEGATIVE
pH, UA: 5.5

## 2016-04-02 MED ORDER — CIPROFLOXACIN HCL 250 MG PO TABS: 250.0000 mg | ORAL_TABLET | Freq: Two times a day (BID) | ORAL | Status: DC

## 2016-04-02 NOTE — Progress Notes (Signed)
Pre visit review using our clinic review tool, if applicable. No additional management support is needed unless otherwise documented below in the visit note. 

## 2016-04-02 NOTE — Addendum Note (Signed)
Addended by: Jacqualin Combes on: 04/02/2016 10:28 AM   Modules accepted: Orders, SmartSet

## 2016-04-02 NOTE — Patient Instructions (Signed)
Start ciprofloxacin antibiotics. Take 1 tablet by mouth twice daily for 5 days.  Ensure you are staying hydrated with water.  Please notify me if no improvement in symptoms in 3-4 days.  It was a pleasure meeting you!  Urinary Tract Infection Urinary tract infections (UTIs) can develop anywhere along your urinary tract. Your urinary tract is your body's drainage system for removing wastes and extra water. Your urinary tract includes two kidneys, two ureters, a bladder, and a urethra. Your kidneys are a pair of bean-shaped organs. Each kidney is about the size of your fist. They are located below your ribs, one on each side of your spine. CAUSES Infections are caused by microbes, which are microscopic organisms, including fungi, viruses, and bacteria. These organisms are so small that they can only be seen through a microscope. Bacteria are the microbes that most commonly cause UTIs. SYMPTOMS  Symptoms of UTIs may vary by age and gender of the patient and by the location of the infection. Symptoms in young women typically include a frequent and intense urge to urinate and a painful, burning feeling in the bladder or urethra during urination. Older women and men are more likely to be tired, shaky, and weak and have muscle aches and abdominal pain. A fever may mean the infection is in your kidneys. Other symptoms of a kidney infection include pain in your back or sides below the ribs, nausea, and vomiting. DIAGNOSIS To diagnose a UTI, your caregiver will ask you about your symptoms. Your caregiver will also ask you to provide a urine sample. The urine sample will be tested for bacteria and white blood cells. White blood cells are made by your body to help fight infection. TREATMENT  Typically, UTIs can be treated with medication. Because most UTIs are caused by a bacterial infection, they usually can be treated with the use of antibiotics. The choice of antibiotic and length of treatment depend on your  symptoms and the type of bacteria causing your infection. HOME CARE INSTRUCTIONS  If you were prescribed antibiotics, take them exactly as your caregiver instructs you. Finish the medication even if you feel better after you have only taken some of the medication.  Drink enough water and fluids to keep your urine clear or pale yellow.  Avoid caffeine, tea, and carbonated beverages. They tend to irritate your bladder.  Empty your bladder often. Avoid holding urine for long periods of time.  Empty your bladder before and after sexual intercourse.  After a bowel movement, women should cleanse from front to back. Use each tissue only once. SEEK MEDICAL CARE IF:   You have back pain.  You develop a fever.  Your symptoms do not begin to resolve within 3 days. SEEK IMMEDIATE MEDICAL CARE IF:   You have severe back pain or lower abdominal pain.  You develop chills.  You have nausea or vomiting.  You have continued burning or discomfort with urination. MAKE SURE YOU:   Understand these instructions.  Will watch your condition.  Will get help right away if you are not doing well or get worse.   This information is not intended to replace advice given to you by your health care provider. Make sure you discuss any questions you have with your health care provider.   Document Released: 07/29/2005 Document Revised: 07/10/2015 Document Reviewed: 11/27/2011 Elsevier Interactive Patient Education [employer].

## 2016-04-02 NOTE — Progress Notes (Signed)
Subjective:    Patient ID: Michelle Gregory, female    DOB: [birth year], 82 y.o.   MRN: [id number]  HPI  Ms. Michelle Gregory is a 82 year old female who presents today with a chief complaint of urinary frequency. She also reports dysuria. She has a history of urinary tract infections and was last treated in February 2017. Her recent symptoms have been present for the past 4 days and they feel like her typical UTI's. Her daughter reports that the patient has been "seeing things" that aren't actually there. Denies fevers, abdominal pain, nausea.  Review of Systems  Constitutional: Positive for fatigue. Negative for fever.  Gastrointestinal: Negative for nausea, vomiting and abdominal pain.  Genitourinary: Positive for dysuria and frequency. Negative for vaginal discharge and pelvic pain.       Past Medical History  Diagnosis Date  . Allergic rhinitis   . Anxiety   . History of colon cancer   . Depression   . GERD (gastroesophageal reflux disease)   . Hyperlipidemia   . Hypertension   . Osteoarthritis   . Vertigo   . Spinal compression fracture ([place])   . Cancer [facility])      Social History   Social History  . Marital Status: Widowed    Spouse Name: N/A  . Number of Children: 1  . Years of Education: N/A   Occupational History  . retired    Social History Main Topics  . Smoking status: Never Smoker   . Smokeless tobacco: Never Used  . Alcohol Use: No  . Drug Use: No  . Sexual Activity: Not on file   Other Topics Concern  . Not on file   Social History Narrative    Past Surgical History  Procedure Laterality Date  . Colon resection    . Appendectomy    . Cataract extraction    . Tonsillectomy      Family History  Problem Relation Age of Onset  . Prostate cancer Father   . Heart attack Mother   . Gout Mother     Allergies  Allergen Reactions  . Amoxicillin-Pot Clavulanate     REACTION: diarrhea  . Keflex [Cephalexin] Diarrhea    Current Outpatient Prescriptions  on File Prior to Visit  Medication Sig Dispense Refill  . allopurinol (ZYLOPRIM) 100 MG tablet Take 2 tablets by mouth  daily 180 tablet 1  . ALPRAZolam (XANAX) 0.5 MG tablet 1/2 pill by mouth up to twice daily as needed for anxiety 30 tablet 1  . Ascorbic Acid (VITAMIN C) 100 MG tablet Take 100 mg by mouth daily.    Marland Kitchen aspirin 81 MG tablet Take 81 mg by mouth daily.      Marland Kitchen BIOTIN PO Take 1 tablet by mouth daily.    . calcium-vitamin D (CALCIUM 500 +D) 500 MG tablet Take 1 tablet by mouth daily.     . diphenoxylate-atropine (LOMOTIL) 2.5-0.025 MG tablet Take 1 tablet by mouth 4 (four) times daily as needed for diarrhea or loose stools. 30 tablet 0  . Docusate Calcium (STOOL SOFTENER PO) Take 1 tablet by mouth daily.    Marland Kitchen glucosamine-chondroitin 500-400 MG tablet Take 1 tablet by mouth daily.    Marland Kitchen HYDROcodone-acetaminophen (VICODIN) 5-500 MG per tablet Take 1 tablet by mouth See admin instructions. Take 5 tablets by mouth daily per daughter    . losartan (COZAAR) 100 MG tablet Take 1 tablet by mouth  daily 90 tablet 1  . magnesium citrate SOLN Take 148  mLs (0.5 Bottles total) by mouth once. Repeat dose in 6 hours if no bowel movement. 195 mL 1  . meclizine (ANTIVERT) 25 MG tablet Take 1 tablet (25 mg total) by mouth 3 (three) times daily as needed. 90 tablet 3  . metoprolol tartrate (LOPRESSOR) 25 MG tablet Take 1 tablet by mouth  twice a day 180 tablet 1  . multivitamin (THERAGRAN) per tablet Take 1 tablet by mouth daily.      Marland Kitchen PARoxetine (PAXIL) 30 MG tablet Take 1 tablet by mouth  every morning 90 tablet 1  . pregabalin (LYRICA) 50 MG capsule Take 50 mg by mouth 2 (two) times daily.     . raloxifene (EVISTA) 60 MG tablet TAKE 1 TABLET BY MOUTH DAILY. 30 tablet 2   No current facility-administered medications on file prior to visit.    BP 116/70 mmHg  Pulse 87  Temp(Src) 98 F (36.7 C) (Oral)  Ht 5\' 1"  (1.549 m)  Wt 137 lb 12.8 oz (62.506 kg)  BMI 26.05 kg/m2  SpO2 92%  LMP  11/02/1980    Objective:   Physical Exam  Constitutional: She appears well-nourished.  Cardiovascular: Normal rate and regular rhythm.   Pulmonary/Chest: Effort normal and breath sounds normal.  Abdominal: Soft. Bowel sounds are normal. There is no tenderness. There is no CVA tenderness.  Skin: Skin is warm and dry.          Assessment & Plan:  Urinary Frequency:  Present for 4 days with dysuria. Patient also "seeing things" recently. Exam today unremarkable. Does not appear toxic. No CVA tenderness. UA: 2+ Leuks, no nitrites or blood. Culture sent. Given presence of symptoms, age, history, and UA, will treat. Rx for low dose Cipro sent to pharmacy. Encouraged water. If Culture negative, will stop treatment.

## 2016-04-02 NOTE — Addendum Note (Signed)
Addended by: Jacqualin Combes on: 04/02/2016 10:28 AM   Modules accepted: Miquel Dunn

## 2016-04-04 LAB — URINE CULTURE: Colony Count: 30000

## 2016-04-13 ENCOUNTER — Ambulatory Visit (INDEPENDENT_AMBULATORY_CARE_PROVIDER_SITE_OTHER): Payer: MEDICARE | Admitting: Family Medicine

## 2016-04-13 ENCOUNTER — Encounter: Payer: Self-pay | Admitting: Family Medicine

## 2016-04-13 ENCOUNTER — Telehealth: Payer: Self-pay

## 2016-04-13 VITALS — BP 110/66 | HR 82 | Temp 97.3°F | Ht 60.5 in | Wt 139.2 lb

## 2016-04-13 DIAGNOSIS — Z85038 Personal history of other malignant neoplasm of large intestine: Secondary | ICD-10-CM

## 2016-04-13 DIAGNOSIS — I10 Essential (primary) hypertension: Secondary | ICD-10-CM

## 2016-04-13 DIAGNOSIS — M8580 Other specified disorders of bone density and structure, unspecified site: Secondary | ICD-10-CM | POA: Diagnosis not present

## 2016-04-13 DIAGNOSIS — N390 Urinary tract infection, site not specified: Secondary | ICD-10-CM | POA: Diagnosis not present

## 2016-04-13 DIAGNOSIS — E785 Hyperlipidemia, unspecified: Secondary | ICD-10-CM

## 2016-04-13 DIAGNOSIS — R739 Hyperglycemia, unspecified: Secondary | ICD-10-CM | POA: Diagnosis not present

## 2016-04-13 DIAGNOSIS — N8110 Cystocele, unspecified: Secondary | ICD-10-CM | POA: Insufficient documentation

## 2016-04-13 DIAGNOSIS — IMO0002 Reserved for concepts with insufficient information to code with codable children: Secondary | ICD-10-CM

## 2016-04-13 LAB — CBC WITH DIFFERENTIAL/PLATELET
Basophils Absolute: 0.1 10*3/uL (ref 0.0–0.1)
Basophils Relative: 0.8 % (ref 0.0–3.0)
Eosinophils Absolute: 0.5 10*3/uL (ref 0.0–0.7)
Eosinophils Relative: 7.6 % — ABNORMAL HIGH (ref 0.0–5.0)
HCT: 39.3 % (ref 36.0–46.0)
Hemoglobin: 13.1 g/dL (ref 12.0–15.0)
Lymphocytes Relative: 17.4 % (ref 12.0–46.0)
Lymphs Abs: 1.2 10*3/uL (ref 0.7–4.0)
MCHC: 33.3 g/dL (ref 30.0–36.0)
MCV: 97.3 fl (ref 78.0–100.0)
Monocytes Absolute: 0.7 10*3/uL (ref 0.1–1.0)
Monocytes Relative: 9.3 % (ref 3.0–12.0)
Neutro Abs: 4.5 10*3/uL (ref 1.4–7.7)
Neutrophils Relative %: 64.9 % (ref 43.0–77.0)
Platelets: 152 10*3/uL (ref 150.0–400.0)
RBC: 4.04 Mil/uL (ref 3.87–5.11)
RDW: 14.1 % (ref 11.5–15.5)
WBC: 7 10*3/uL (ref 4.0–10.5)

## 2016-04-13 LAB — COMPREHENSIVE METABOLIC PANEL
ALT: 11 U/L (ref 0–35)
AST: 19 U/L (ref 0–37)
Albumin: 3.9 g/dL (ref 3.5–5.2)
Alkaline Phosphatase: 58 U/L (ref 39–117)
BUN: 21 mg/dL (ref 6–23)
CO2: 34 mEq/L — ABNORMAL HIGH (ref 19–32)
Calcium: 9.5 mg/dL (ref 8.4–10.5)
Chloride: 97 mEq/L (ref 96–112)
Creatinine, Ser: 0.84 mg/dL (ref 0.40–1.20)
GFR: 67.53 mL/min (ref >60.00)
Glucose, Bld: 85 mg/dL (ref 70–99)
Potassium: 4.1 mEq/L (ref 3.5–5.1)
Sodium: 137 mEq/L (ref 135–145)
Total Bilirubin: 0.6 mg/dL (ref 0.2–1.2)
Total Protein: 6.2 g/dL (ref 6.0–8.3)

## 2016-04-13 LAB — POC URINALSYSI DIPSTICK (AUTOMATED)
Bilirubin, UA: NEGATIVE
Blood, UA: NEGATIVE
Glucose, UA: NEGATIVE
Ketones, UA: NEGATIVE
Nitrite, UA: NEGATIVE
Spec Grav, UA: 1.015
Urobilinogen, UA: 0.2
pH, UA: 6

## 2016-04-13 LAB — LIPID PANEL
Cholesterol: 122 mg/dL (ref 0–200)
HDL: 32.7 mg/dL — ABNORMAL LOW (ref >39.00)
LDL Cholesterol: 58 mg/dL (ref 0–99)
NonHDL: 88.81
Total CHOL/HDL Ratio: 4
Triglycerides: 156 mg/dL — ABNORMAL HIGH (ref 0.0–149.0)
VLDL: 31.2 mg/dL (ref 0.0–40.0)

## 2016-04-13 LAB — HEMOGLOBIN A1C: Hgb A1c MFr Bld: 5.5 % (ref 4.6–6.5)

## 2016-04-13 LAB — TSH: TSH: 1.77 u[IU]/mL (ref 0.35–4.50)

## 2016-04-13 MED ORDER — CIPROFLOXACIN HCL 250 MG PO TABS: 250.0000 mg | ORAL_TABLET | Freq: Two times a day (BID) | ORAL | Status: DC

## 2016-04-13 MED ORDER — RALOXIFENE HCL 60 MG PO TABS: 60.0000 mg | ORAL_TABLET | Freq: Every day | ORAL | Status: DC

## 2016-04-13 NOTE — Assessment & Plan Note (Signed)
A1C today More sugar/ice cream lately  Disc prev of DM2

## 2016-04-13 NOTE — Patient Instructions (Addendum)
Labs today  Bring a urine specimen from home - bring it in right after you give it  Take care of yourself  Make an appt. With Katha Cabal for your medicare interview this summer  I think what you feel vaginally is a mild prolapse of your bladder (cystocele)- it is quite common - update me if this worsens or causes problems

## 2016-04-13 NOTE — Telephone Encounter (Signed)
Advise daughter of UA results and Dr. Marliss Coots comments. Daughter is requesting to still start on abx asap instead of waiting for urine cx to return. Pt is very confused and is falling and she is afraid that her sxs will worsen over the next 2-3 days if we wait for the culture and pt lives by herself so daughter is really concerned

## 2016-04-13 NOTE — Assessment & Plan Note (Signed)
Declines dexa  No fx in past year but has had comp fx in [place] and [place] One fall due to a uti On evista and ca and D  Disc need for calcium/ vitamin D/ wt bearing exercise and bone density test every 2 y to monitor Disc safety/ fracture risk in detail

## 2016-04-13 NOTE — Progress Notes (Signed)
Pre visit review using our clinic review tool, if applicable. No additional management support is needed unless otherwise documented below in the visit note. 

## 2016-04-13 NOTE — Assessment & Plan Note (Signed)
bp in fair control at this time  BP Readings from Last 1 Encounters:  04/13/16 110/66   No changes needed Disc lifstyle change with low sodium diet and exercise  Labs today

## 2016-04-13 NOTE — Progress Notes (Signed)
Subjective:    Patient ID: Michelle Gregory, female    DOB: May 24, 1923, 82 y.o.   MRN: [id number]  HPI Here for annual f/u of chronic medical problems   Wt is up 2 lb with bmi of 26  Recent uti Given cipro and then cx returned with 30,000 colonies /mixed bact types  Symptoms are returning  Some discomfort on urination  She has chronic incontinence   Zoster vaccine - ins will not pay for /cannot afford   Does not get mammograms  No lumps on self exam and no concerns  Does not do colon ca screen 82 yo (last colonoscopy neg in 2003)-then had colon cancer with a partial colon resection   imms up to date otherwise  Pt declined dexa in 5/15 Hx of osteopenia She has had hx of compression fx in [place] and [place] No fractures in the past year  Has been on evista and ca and vit D   Last labs were in feb  bp is stable today  No cp or palpitations or headaches or edema  No side effects to medicines  BP Readings from Last 3 Encounters:  04/13/16 110/66  04/02/16 116/70  02/17/16 122/84      Chemistry      Component Value Date/Time   NA 139 12/24/2015 1241   K 4.2 12/24/2015 1241   CL 102 12/24/2015 1241   CO2 25 12/24/2015 1241   BUN 19 12/24/2015 1241   CREATININE 0.87 12/24/2015 1241      Component Value Date/Time   CALCIUM 9.8 12/24/2015 1241   ALKPHOS 67 12/24/2015 1241   AST 18 12/24/2015 1241   ALT 10* 12/24/2015 1241   BILITOT 0.5 12/24/2015 1241       Hx of hyperlipidemia Lab Results  Component Value Date   CHOL 126 04/09/2015   HDL 31.90* 04/09/2015   LDLCALC 66 04/09/2015   LDLDIRECT 113.7 07/08/2010   TRIG 139.0 04/09/2015   CHOLHDL 4 04/09/2015   is interested in checking it  Diet is fair no fried food or red meat   Hx of hyperglycemia Lab Results  Component Value Date   HGBA1C 5.6 04/09/2015  eats a lot of ice cream  This may be up  Has an area in vaginal area she feels when she wipes  Not painful however  Wondered what it was (?  Bladder)  Patient Active Problem List   Diagnosis Date Noted  . Cystocele 04/13/2016  . Urinary urgency 02/17/2016  . Bowel habit changes 12/30/2015  . Compression fracture 12/30/2015  . Excessive cerumen in right ear canal 05/27/2015  . Ingrown toenail without infection 05/04/2015  . Routine general medical examination at a health care facility 04/09/2015  . UTI (urinary tract infection) 05/09/2014  . Candidal intertrigo 03/28/2014  . Excessive cerumen in both ear canals 01/17/2014  . Encounter for Medicare annual wellness exam 03/21/2013  . Gout 09/19/2012  . Osteopenia 05/05/2011  . Degenerative lumbar disc 01/20/2011  . VARICOSE VEINS, LOWER EXTREMITIES 09/04/2010  . Hyperglycemia 01/08/2010  . Hyperlipidemia 05/09/2008  . GOUT 05/09/2008  . Anxiety state 05/09/2008  . Essential hypertension 05/09/2008  . HEMORRHOIDS, INTERNAL 05/09/2008  . Allergic rhinitis 05/09/2008  . Mild reactive airways disease 05/09/2008  . GERD 05/09/2008  . IBS 05/09/2008  . OSTEOARTHRITIS 05/09/2008  . History of malignant neoplasm of large intestine 05/09/2008   Past Medical History  Diagnosis Date  . Allergic rhinitis   . Anxiety   . History of  colon cancer   . Depression   . GERD (gastroesophageal reflux disease)   . Hyperlipidemia   . Hypertension   . Osteoarthritis   . Vertigo   . Spinal compression fracture ([place])   . Cancer [facility])    Past Surgical History  Procedure Laterality Date  . Colon resection    . Appendectomy    . Cataract extraction    . Tonsillectomy     Social History  Substance Use Topics  . Smoking status: Never Smoker   . Smokeless tobacco: Never Used  . Alcohol Use: No   Family History  Problem Relation Age of Onset  . Prostate cancer Father   . Heart attack Mother   . Gout Mother    Allergies  Allergen Reactions  . Amoxicillin-Pot Clavulanate     REACTION: diarrhea  . Keflex [Cephalexin] Diarrhea   Current Outpatient Prescriptions on File  Prior to Visit  Medication Sig Dispense Refill  . allopurinol (ZYLOPRIM) 100 MG tablet Take 2 tablets by mouth  daily 180 tablet 1  . ALPRAZolam (XANAX) 0.5 MG tablet 1/2 pill by mouth up to twice daily as needed for anxiety 30 tablet 1  . Ascorbic Acid (VITAMIN C) 100 MG tablet Take 100 mg by mouth daily.    Marland Kitchen aspirin 81 MG tablet Take 81 mg by mouth daily.      Marland Kitchen BIOTIN PO Take 1 tablet by mouth daily.    . calcium-vitamin D (CALCIUM 500 +D) 500 MG tablet Take 1 tablet by mouth daily.     . diphenoxylate-atropine (LOMOTIL) 2.5-0.025 MG tablet Take 1 tablet by mouth 4 (four) times daily as needed for diarrhea or loose stools. 30 tablet 0  . Docusate Calcium (STOOL SOFTENER PO) Take 1 tablet by mouth daily.    Marland Kitchen glucosamine-chondroitin 500-400 MG tablet Take 1 tablet by mouth daily.    Marland Kitchen HYDROcodone-acetaminophen (VICODIN) 5-500 MG per tablet Take 1 tablet by mouth See admin instructions. Take 5 tablets by mouth daily per daughter    . losartan (COZAAR) 100 MG tablet Take 1 tablet by mouth  daily 90 tablet 1  . magnesium citrate SOLN Take 148 mLs (0.5 Bottles total) by mouth once. Repeat dose in 6 hours if no bowel movement. 195 mL 1  . meclizine (ANTIVERT) 25 MG tablet Take 1 tablet (25 mg total) by mouth 3 (three) times daily as needed. 90 tablet 3  . metoprolol tartrate (LOPRESSOR) 25 MG tablet Take 1 tablet by mouth  twice a day 180 tablet 1  . multivitamin (THERAGRAN) per tablet Take 1 tablet by mouth daily.      Marland Kitchen PARoxetine (PAXIL) 30 MG tablet Take 1 tablet by mouth  every morning 90 tablet 1  . pregabalin (LYRICA) 50 MG capsule Take 50 mg by mouth 2 (two) times daily.      No current facility-administered medications on file prior to visit.    Review of Systems Review of Systems  Constitutional: Negative for fever, appetite change,  and unexpected weight change.  Eyes: Negative for pain and visual disturbance.  Respiratory: Negative for cough and shortness of breath.    Cardiovascular: Negative for cp or palpitations    Gastrointestinal: Negative for nausea, diarrhea and constipation.  Genitourinary: pos for urgency and frequency. neg for hematuria, pos for discomfort with urination  Skin: Negative for pallor or rash   Neurological: Negative for weakness, light-headedness, numbness and headaches.  MSK pos for joint pain  Hematological: Negative for adenopathy. Does not  bruise/bleed easily.  Psychiatric/Behavioral: Negative for dysphoric mood. The patient is not nervous/anxious.         Objective:   Physical Exam  Constitutional: She appears well-developed and well-nourished. No distress.  Frail appearing elderly female  HENT:  Head: Normocephalic and atraumatic.  Right Ear: External ear normal.  Left Ear: External ear normal.  Nose: Nose normal.  Mouth/Throat: Oropharynx is clear and moist.  Eyes: Conjunctivae and EOM are normal. Pupils are equal, round, and reactive to light. Right eye exhibits no discharge. Left eye exhibits no discharge. No scleral icterus.  Neck: Normal range of motion. Neck supple. No JVD present. Carotid bruit is not present. No thyromegaly present.  Cardiovascular: Normal rate, regular rhythm, normal heart sounds and intact distal pulses.  Exam reveals no gallop.   Pulmonary/Chest: Effort normal and breath sounds normal. No respiratory distress. She has no wheezes. She has no rales.  Abdominal: Soft. Bowel sounds are normal. She exhibits no distension and no mass. There is no tenderness.  Genitourinary:  Declines breast exam  Cystocele noted today -nt with no M on bimanual  Musculoskeletal: She exhibits no edema or tenderness.  Lymphadenopathy:    She has no cervical adenopathy.  Neurological: She is alert. She has normal reflexes. No cranial nerve deficit. She exhibits normal muscle tone. Coordination normal.  Skin: Skin is warm and dry. No rash noted. No erythema. No pallor.  Many SKs  Psychiatric: She has a normal  mood and affect.          Assessment & Plan:   Problem List Items Addressed This Visit      Cardiovascular and Mediastinum   Essential hypertension - Primary    bp in fair control at this time  BP Readings from Last 1 Encounters:  04/13/16 110/66   No changes needed Disc lifstyle change with low sodium diet and exercise  Labs today        Relevant Orders   CBC with Differential/Platelet (Completed)   Comprehensive metabolic panel (Completed)   TSH (Completed)   Lipid panel (Completed)     Musculoskeletal and Integument   Osteopenia    Declines dexa  No fx in past year but has had comp fx in [place] and [place] One fall due to a uti On evista and ca and D  Disc need for calcium/ vitamin D/ wt bearing exercise and bone density test every 2 y to monitor Disc safety/ fracture risk in detail          Genitourinary   UTI (urinary tract infection)    Re check ua and cx today and cover with cipro ? If colonized Cystocele on exam-this could play a role (she does not want surgery at her age) Enc fluid intake Chem labs also today      Relevant Orders   POCT Urinalysis Dipstick (Automated) (Completed)   Urine culture   Cystocele    Mild on exam  Pt does have hx of freq utis Does not desire surgery        Other   Hyperlipidemia    Hx of high cholesterol Declines tx at her age Disc goals for lipids and reasons to control them Rev labs with pt (last check) Rev low sat fat diet in detail       Relevant Orders   Lipid panel (Completed)   Hyperglycemia    A1C today More sugar/ice cream lately  Disc prev of DM2      Relevant Orders   Hemoglobin  A1c (Completed)   History of malignant neoplasm of large intestine    Declines further eval/screening at age 56

## 2016-04-13 NOTE — Assessment & Plan Note (Signed)
Re check ua and cx today and cover with cipro ? If colonized Cystocele on exam-this could play a role (she does not want surgery at her age) Enc fluid intake Chem labs also today

## 2016-04-13 NOTE — Assessment & Plan Note (Signed)
Mild on exam  Pt does have hx of freq utis Does not desire surgery

## 2016-04-13 NOTE — Telephone Encounter (Signed)
Michelle Gregory left v/m requesting cb about urine results; Michelle Gregory was seen earlier today. [name] said Michelle Gregory is very confused and is either caused by UTI or something else is going on. [name] request cb ASAP.

## 2016-04-13 NOTE — Telephone Encounter (Signed)
Let her know I sent cipro into her [facility]

## 2016-04-13 NOTE — Assessment & Plan Note (Signed)
Declines further eval/screening at age 82

## 2016-04-13 NOTE — Telephone Encounter (Signed)
Daughter advised.

## 2016-04-13 NOTE — Assessment & Plan Note (Signed)
Hx of high cholesterol Declines tx at her age Disc goals for lipids and reasons to control them Rev labs with pt (last check) Rev low sat fat diet in detail

## 2016-04-14 ENCOUNTER — Encounter: Payer: Self-pay | Admitting: *Deleted

## 2016-04-14 LAB — URINE CULTURE: Colony Count: 50000

## 2016-04-20 ENCOUNTER — Other Ambulatory Visit: Payer: Self-pay | Admitting: Family Medicine

## 2016-04-28 ENCOUNTER — Other Ambulatory Visit: Payer: MEDICARE

## 2016-04-28 DIAGNOSIS — N390 Urinary tract infection, site not specified: Secondary | ICD-10-CM | POA: Diagnosis not present

## 2016-04-28 DIAGNOSIS — Z87440 Personal history of urinary (tract) infections: Secondary | ICD-10-CM

## 2016-04-30 LAB — URINE CULTURE
Colony Count: NO GROWTH
Organism ID, Bacteria: NO GROWTH

## 2016-06-08 ENCOUNTER — Other Ambulatory Visit: Payer: Self-pay | Admitting: Family Medicine

## 2016-06-26 ENCOUNTER — Telehealth: Payer: Self-pay

## 2016-06-26 NOTE — Telephone Encounter (Signed)
Thanks for letting me know - caution of falls with that  I hope it helps

## 2016-06-26 NOTE — Telephone Encounter (Signed)
Michelle Gregory (DPR signed) left v/m that Michelle Gregory is going to start giving pt 1/2 of alprazolam at night to help pt sleep. FYI to Dr Glori Bickers.

## 2016-06-29 ENCOUNTER — Other Ambulatory Visit: Payer: Self-pay | Admitting: *Deleted

## 2016-06-29 DIAGNOSIS — F411 Generalized anxiety disorder: Secondary | ICD-10-CM

## 2016-06-29 MED ORDER — ALPRAZOLAM 0.5 MG PO TABS: ORAL_TABLET | ORAL | 2 refills | Status: DC

## 2016-06-29 NOTE — Telephone Encounter (Signed)
Px written for call in   

## 2016-06-29 NOTE — Telephone Encounter (Signed)
Rx called in as prescribed 

## 2016-06-29 NOTE — Telephone Encounter (Signed)
Fax refill request, last filled on 03/19/15 #30 with 1 additional refill, please advise

## 2016-07-01 ENCOUNTER — Other Ambulatory Visit: Payer: Self-pay

## 2016-07-02 ENCOUNTER — Ambulatory Visit (INDEPENDENT_AMBULATORY_CARE_PROVIDER_SITE_OTHER): Payer: MEDICARE | Admitting: Podiatry

## 2016-07-02 DIAGNOSIS — L600 Ingrowing nail: Secondary | ICD-10-CM

## 2016-07-02 DIAGNOSIS — M79676 Pain in unspecified toe(s): Secondary | ICD-10-CM | POA: Diagnosis not present

## 2016-07-02 DIAGNOSIS — B351 Tinea unguium: Secondary | ICD-10-CM | POA: Diagnosis not present

## 2016-07-02 NOTE — Progress Notes (Signed)
Subjective: 82 y.o. returns the office today for painful, elongated, thickened toenails which she cannot trim herself. Denies any redness or drainage around the nails. Denies any acute changes since last appointment and no new complaints today. Denies any systemic complaints such as fevers, chills, nausea, vomiting.   Objective: AAO 3, NAD DP/PT pulses palpable, CRT less than 3 seconds Nails hypertrophic, dystrophic, elongated, brittle, discolored 10. There is tenderness overlying the nails 1-5 bilaterally. There is no surrounding erythema or drainage along the nail sites. There incurvation of the distal portion of bilateral hallux toenails the any signs of infection. No open lesions or pre-ulcerative lesions are identified. No other areas of tenderness bilateral lower extremities. No overlying edema, erythema, increased warmth. No pain with calf compression, swelling, warmth, erythema.  Assessment: Patient presents with symptomatic onychomycosis; bilateral hallux ingrown toenail  Plan: -Treatment options including alternatives, risks, complications were discussed -Nails sharply debrided 10 without complication/bleeding. Upon debridement resolution of symptoms. -Discussed daily foot inspection. If there are any changes, to call the office immediately.  -Follow-up in 3 months or sooner if any problems are to arise. In the meantime, encouraged to call the office with any questions, concerns, changes symptoms.  Celesta Gentile, DPM

## 2016-07-07 DIAGNOSIS — M5136 Other intervertebral disc degeneration, lumbar region: Secondary | ICD-10-CM | POA: Diagnosis not present

## 2016-07-07 DIAGNOSIS — G894 Chronic pain syndrome: Secondary | ICD-10-CM | POA: Diagnosis not present

## 2016-07-07 DIAGNOSIS — Z79891 Long term (current) use of opiate analgesic: Secondary | ICD-10-CM | POA: Diagnosis not present

## 2016-08-25 ENCOUNTER — Ambulatory Visit (INDEPENDENT_AMBULATORY_CARE_PROVIDER_SITE_OTHER): Payer: MEDICARE

## 2016-08-25 DIAGNOSIS — Z23 Encounter for immunization: Secondary | ICD-10-CM

## 2016-09-07 ENCOUNTER — Ambulatory Visit (INDEPENDENT_AMBULATORY_CARE_PROVIDER_SITE_OTHER): Payer: MEDICARE

## 2016-09-07 VITALS — BP 118/70 | HR 77 | Temp 98.4°F | Ht 60.0 in | Wt 135.8 lb

## 2016-09-07 DIAGNOSIS — Z0000 Encounter for general adult medical examination without abnormal findings: Secondary | ICD-10-CM | POA: Diagnosis not present

## 2016-09-07 NOTE — Progress Notes (Signed)
Pre visit review using our clinic review tool, if applicable. No additional management support is needed unless otherwise documented below in the visit note. 

## 2016-09-07 NOTE — Progress Notes (Signed)
Subjective:   Michelle Gregory is a 82 y.o. female who presents for Medicare Annual (Subsequent) preventive examination.  Review of Systems:  N/A Cardiac Risk Factors include: advanced age (>22men, >63 women);dyslipidemia;hypertension     Objective:     Vitals: BP 118/70 (BP Location: Left Arm, Patient Position: Sitting, Cuff Size: Normal)   Pulse 77   Temp 98.4 F (36.9 C) (Oral)   Ht 5' (1.524 m)   Wt 135 lb 12 oz (61.6 kg)   LMP 11/02/1980   SpO2 95%   BMI 26.51 kg/m   Body mass index is 26.51 kg/m.   Tobacco History  Smoking Status  . Never Smoker  Smokeless Tobacco  . Never Used     Counseling given: No   Past Medical History:  Diagnosis Date  . Allergic rhinitis   . Anxiety   . Cancer ([place])   . Depression   . GERD (gastroesophageal reflux disease)   . History of colon cancer   . Hyperlipidemia   . Hypertension   . Osteoarthritis   . Spinal compression fracture ([place])   . Vertigo    Past Surgical History:  Procedure Laterality Date  . APPENDECTOMY    . CATARACT EXTRACTION    . COLON RESECTION    . TONSILLECTOMY     Family History  Problem Relation Age of Onset  . Prostate cancer Father   . Heart attack Mother   . Gout Mother    History  Sexual Activity  . Sexual activity: No    Outpatient Encounter Prescriptions as of 09/07/2016  Medication Sig  . allopurinol (ZYLOPRIM) 100 MG tablet Take 2 tablets by mouth  daily  . ALPRAZolam (XANAX) 0.5 MG tablet 1/2 pill by mouth up to twice daily as needed for anxiety  . Ascorbic Acid (VITAMIN C) 100 MG tablet Take 100 mg by mouth daily.  Marland Kitchen aspirin 81 MG tablet Take 81 mg by mouth daily.    Marland Kitchen BIOTIN PO Take 1 tablet by mouth daily.  . calcium-vitamin D (CALCIUM 500 +D) 500 MG tablet Take 1 tablet by mouth daily.   . diphenoxylate-atropine (LOMOTIL) 2.5-0.025 MG tablet Take 1 tablet by mouth 4 (four) times daily as needed for diarrhea or loose stools.  Mariane Baumgarten Calcium (STOOL SOFTENER PO) Take 1  tablet by mouth daily.  Marland Kitchen glucosamine-chondroitin 500-400 MG tablet Take 1 tablet by mouth daily.  Marland Kitchen HYDROcodone-acetaminophen (VICODIN) 5-500 MG per tablet Take 1 tablet by mouth See admin instructions. Take 5 tablets by mouth daily per daughter  . losartan (COZAAR) 100 MG tablet Take 1 tablet by mouth  daily  . magnesium citrate SOLN Take 148 mLs (0.5 Bottles total) by mouth once. Repeat dose in 6 hours if no bowel movement.  . meclizine (ANTIVERT) 25 MG tablet Take 1 tablet (25 mg total) by mouth 3 (three) times daily as needed.  . metoprolol tartrate (LOPRESSOR) 25 MG tablet Take 1 tablet by mouth  twice a day  . multivitamin (THERAGRAN) per tablet Take 1 tablet by mouth daily.    Marland Kitchen PARoxetine (PAXIL) 30 MG tablet Take 1 tablet by mouth  every morning (Patient taking differently: Take 1 tablet by mouth  every evening)  . pregabalin (LYRICA) 50 MG capsule Take 50 mg by mouth 2 (two) times daily.   . raloxifene (EVISTA) 60 MG tablet Take 1 tablet (60 mg total) by mouth daily.  . [DISCONTINUED] ciprofloxacin (CIPRO) 250 MG tablet Take 1 tablet (250 mg total) by mouth  2 (two) times daily.   No facility-administered encounter medications on file as of 09/07/2016.     Activities of Daily Living In your present state of health, do you have any difficulty performing the following activities: 09/07/2016  Hearing? N  Vision? Y  Difficulty concentrating or making decisions? Y  Walking or climbing stairs? N  Dressing or bathing? Y  Doing errands, shopping? Y  Preparing Food and eating ? N  Using the Toilet? N  In the past six months, have you accidently leaked urine? Y  Do you have problems with loss of bowel control? N  Managing your Medications? Y  Managing your Finances? Y  Housekeeping or managing your Housekeeping? Y  Some recent data might be hidden    Patient Care Team: Abner Greenspan, MD as PCP - General Suella Broad, MD as Consulting Physician (Physical Medicine and  Rehabilitation)    Assessment:     Hearing Screening   125Hz  250Hz  500Hz  1000Hz  2000Hz  3000Hz  4000Hz  6000Hz  8000Hz   Right ear:   0 0 0  0    Left ear:   0 0 0  0      Visual Acuity Screening   Right eye Left eye Both eyes  Without correction:     With correction:  20/70 20/70  Comments: Vision loss in right eye   Exercise Activities and Dietary recommendations Current Exercise Habits: The patient does not participate in regular exercise at present, Exercise limited by: None identified  Goals    . Increase water intake          Starting 09/07/2016, I will continue to drink at least 5 bottles of water daily.       Fall Risk Fall Risk  09/07/2016 04/09/2015 03/28/2014  Falls in the past year? Yes No Yes  Number falls in past yr: 2 or more - 1  Injury with Fall? No - No  Follow up Falls evaluation completed - -   Depression Screen PHQ 2/9 Scores 09/07/2016 04/09/2015 03/28/2014  PHQ - 2 Score 0 0 0     Cognitive Function MMSE - Mini Mental State Exam 09/07/2016  Orientation to time 0  Orientation to time comments pt was unable to provide current month and year  Orientation to Place 5  Registration 3  Attention/ Calculation 0  Recall 1  Recall-comments pt was unable to recall 1 of 3 words  Language- name 2 objects 0  Language- repeat 1  Language- follow 3 step command 1  Language- follow 3 step command-comments pt was unable to follow 2 of 3 step command despite multiple cues provided  Language- read & follow direction 0  Write a sentence 0  Copy design 0  Total score 11       PLEASE NOTE: A Mini-Cog screen was completed. Maximum score is 20. A value of 0 denotes this part of [place]stein MMSE was not completed or the patient failed this part of the Mini-Cog screening.   Mini-Cog Screening Orientation to Time - Max 5 pts Orientation to Place - Max 5 pts Registration - Max 3 pts Recall - Max 3 pts Language Repeat - Max 1 pts Language Follow 3 Step Command - Max 3  pts   Immunization History  Administered Date(s) Administered  . Influenza Whole 07/23/2010  . Influenza,inj,Quad PF,36+ Mos 08/25/2016  . Influenza-Unspecified 08/29/2013, 09/07/2014  . Pneumococcal Conjugate-13 03/28/2014  . Pneumococcal Polysaccharide-23 04/09/2015   Screening Tests Health Maintenance  Topic Date Due  . MAMMOGRAM  12/04/2020 (Originally 02/26/1941)  . ZOSTAVAX  12/05/2023 (Originally 02/25/1985)  . TETANUS/TDAP  11/02/2017  . INFLUENZA VACCINE  Addressed  . DEXA SCAN  Completed  . PNA vac Low Risk Adult  Completed      Plan:     I have personally reviewed and addressed the Medicare Annual Wellness questionnaire and have noted the following in the patient's chart:  A. Medical and social history B. Use of alcohol, tobacco or illicit drugs  C. Current medications and supplements D. Functional ability and status E.  Nutritional status F.  Physical activity G. Advance directives H. List of other physicians I.  Hospitalizations, surgeries, and ER visits in previous 12 months J.  [place] to include hearing, vision, cognitive, depression L. Referrals and appointments - none  In addition, I have reviewed and discussed with patient certain preventive protocols, quality metrics, and best practice recommendations. A written personalized care plan for preventive services as well as general preventive health recommendations were provided to patient.  See attached scanned questionnaire for additional information.   Signed,   Lindell Noe, MHA, BS, LPN Health Coach

## 2016-09-07 NOTE — Patient Instructions (Signed)
Michelle Gregory , Thank you for taking time to come for your Medicare Wellness Visit. I appreciate your ongoing commitment to your health goals. Please review the following plan we discussed and let me know if I can assist you in the future.   These are the goals we discussed: Goals    . Increase water intake          Starting 09/07/2016, I will continue to drink at least 5 bottles of water daily.        This is a list of the screening recommended for you and due dates:  Health Maintenance  Topic Date Due  . Mammogram  12/04/2020*  . Shingles Vaccine  12/05/2023*  . Tetanus Vaccine  11/02/2017  . Flu Shot  Addressed  . DEXA scan (bone density measurement)  Completed  . Pneumonia vaccines  Completed  *Topic was postponed. The date shown is not the original due date.   Preventive Care for Adults  A healthy lifestyle and preventive care can promote health and wellness. Preventive health guidelines for adults include the following key practices.  . A routine yearly physical is a good way to check with your health care provider about your health and preventive screening. It is a chance to share any concerns and updates on your health and to receive a thorough exam.  . Visit your dentist for a routine exam and preventive care every 6 months. Brush your teeth twice a day and floss once a day. Good oral hygiene prevents tooth decay and gum disease.  . The frequency of eye exams is based on your age, health, family medical history, use  of contact lenses, and other factors. Follow your health care provider's ecommendations for frequency of eye exams.  . Eat a healthy diet. Foods like vegetables, fruits, whole grains, low-fat dairy products, and lean protein foods contain the nutrients you need without too many calories. Decrease your intake of foods high in solid fats, added sugars, and salt. Eat the right amount of calories for you. Get information about a proper diet from your health care  provider, if necessary.  . Regular physical exercise is one of the most important things you can do for your health. Most adults should get at least 150 minutes of moderate-intensity exercise (any activity that increases your heart rate and causes you to sweat) each week. In addition, most adults need muscle-strengthening exercises on 2 or more days a week.  Silver Sneakers may be a benefit available to you. To determine eligibility, you may visit the website: www.silversneakers.com or contact program at 407-495-5713 Mon-Fri between 8AM-8PM.   . Maintain a healthy weight. The body mass index (BMI) is a screening tool to identify possible weight problems. It provides an estimate of body fat based on height and weight. Your health care provider can find your BMI and can help you achieve or maintain a healthy weight.   For adults 20 years and older: ? A BMI below 18.5 is considered underweight. ? A BMI of 18.5 to 24.9 is normal. ? A BMI of 25 to 29.9 is considered overweight. ? A BMI of 30 and above is considered obese.   . Maintain normal blood lipids and cholesterol levels by exercising and minimizing your intake of saturated fat. Eat a balanced diet with plenty of fruit and vegetables. Blood tests for lipids and cholesterol should begin at age 50 and be repeated every 5 years. If your lipid or cholesterol levels are high, you are over  50, or you are at high risk for heart disease, you may need your cholesterol levels checked more frequently. Ongoing high lipid and cholesterol levels should be treated with medicines if diet and exercise are not working.  . If you smoke, find out from your health care provider how to quit. If you do not use tobacco, please do not start.  . If you choose to drink alcohol, please do not consume more than 2 drinks per day. One drink is considered to be 12 ounces (355 mL) of beer, 5 ounces (148 mL) of wine, or 1.5 ounces (44 mL) of liquor.  . If you are 46-79 years  old, ask your health care provider if you should take aspirin to prevent strokes.  . Use sunscreen. Apply sunscreen liberally and repeatedly throughout the day. You should seek shade when your shadow is shorter than you. Protect yourself by wearing long sleeves, pants, a wide-brimmed hat, and sunglasses year round, whenever you are outdoors.  . Once a month, do a whole body skin exam, using a mirror to look at the skin on your back. Tell your health care provider of new moles, moles that have irregular borders, moles that are larger than a pencil eraser, or moles that have changed in shape or color.

## 2016-09-07 NOTE — Progress Notes (Signed)
PCP notes:   Health maintenance:  No gaps identified. Health maintenance schedule reviewed.   Abnormal screenings:   Hearing - failed Fall risk - hx of multiple falls without injury Mini-Cog score: 11/20  Patient concerns:    Daughter reports pt takes Paxil in evening rather than in morning. Daughter also feels patient's memory has decreased in the past 6 months due to pain medication. Daughter stated Lyrica has large co-pay. Provided her with info to [facility].   Nurse concerns:  None  Next PCP appt:   I reviewed health advisor's note, was available for consultation, and agree with documentation and plan.  Loura Pardon MD   N/A; pt had CPE in June 2017

## 2016-09-29 ENCOUNTER — Ambulatory Visit: Payer: MEDICARE | Admitting: Podiatry

## 2016-10-02 ENCOUNTER — Ambulatory Visit: Payer: 59 | Admitting: Podiatry

## 2016-11-05 ENCOUNTER — Ambulatory Visit: Payer: MEDICARE | Admitting: Podiatry

## 2016-12-02 ENCOUNTER — Telehealth: Payer: Self-pay

## 2016-12-02 ENCOUNTER — Other Ambulatory Visit (INDEPENDENT_AMBULATORY_CARE_PROVIDER_SITE_OTHER): Payer: MEDICARE

## 2016-12-02 DIAGNOSIS — R300 Dysuria: Secondary | ICD-10-CM | POA: Diagnosis not present

## 2016-12-02 DIAGNOSIS — R8290 Unspecified abnormal findings in urine: Secondary | ICD-10-CM

## 2016-12-02 LAB — POC URINALSYSI DIPSTICK (AUTOMATED)
Bilirubin, UA: NEGATIVE
Glucose, UA: NEGATIVE
Ketones, UA: NEGATIVE
Nitrite, UA: NEGATIVE
Protein, UA: NEGATIVE
Spec Grav, UA: 1.015
Urobilinogen, UA: 0.2
pH, UA: 8

## 2016-12-02 MED ORDER — SULFAMETHOXAZOLE-TRIMETHOPRIM 800-160 MG PO TABS: 1.0000 | ORAL_TABLET | Freq: Two times a day (BID) | ORAL | 0 refills | Status: DC

## 2016-12-02 NOTE — Progress Notes (Signed)
Her urine looks infected I will send septra in [facility] [place] [facility]!  Culture pending

## 2016-12-02 NOTE — Telephone Encounter (Signed)
Daughter will drop off a urine sample

## 2016-12-02 NOTE — Telephone Encounter (Signed)
Please bring in a urine sample so I can get it going for a ua and cx asap.  Do encorage water intake - the dark color often indicates dehydration

## 2016-12-02 NOTE — Telephone Encounter (Signed)
Michelle Gregory pts daughter (DPR signed) said pt's urine is very dark, pt is confused (pt thinks people on TV are talking to her), slight pain when urinates. No fever. Michelle Gregory does not want to bring pt to office due her being exposed to the flu. Michelle Gregory request cb ASAP. [facility]. Last saw Dr Glori Bickers 04/13/16; last say Lattie Haw for med well on 09/07/16.Please advise.

## 2016-12-03 ENCOUNTER — Telehealth: Payer: Self-pay | Admitting: *Deleted

## 2016-12-03 ENCOUNTER — Ambulatory Visit: Payer: MEDICARE | Admitting: Podiatry

## 2016-12-03 LAB — URINE CULTURE: Organism ID, Bacteria: NO GROWTH

## 2016-12-03 MED ORDER — CIPROFLOXACIN HCL 250 MG PO TABS: 250.0000 mg | ORAL_TABLET | Freq: Two times a day (BID) | ORAL | 0 refills | Status: DC

## 2016-12-03 NOTE — Telephone Encounter (Signed)
Daughter [name] called back she advise me that as soon as she gave pt the medication she became very nauseous and now is refusing to eat or take medication due to side eff. Daughter said she is sure it's the medication because pt didn't complain of these sxs until she started taking the abx, daughter said pt has taken cipro in the past with no issues but pt is refusing to keep taking med and daughter hasn't given her any med today until she hears back from Dr. Glori Bickers

## 2016-12-03 NOTE — Telephone Encounter (Signed)
Left voicemail letting daughter know that Dr. Glori Bickers sent in [place] and to stop the septra, med added to allergy list

## 2016-12-03 NOTE — Telephone Encounter (Signed)
I sent cipro to her pharmacy Stop the septra and list it as a med intolerance please

## 2016-12-11 ENCOUNTER — Other Ambulatory Visit: Payer: Self-pay | Admitting: Family Medicine

## 2017-01-04 ENCOUNTER — Telehealth: Payer: Self-pay

## 2017-01-04 DIAGNOSIS — Z79891 Long term (current) use of opiate analgesic: Secondary | ICD-10-CM | POA: Diagnosis not present

## 2017-01-04 DIAGNOSIS — G894 Chronic pain syndrome: Secondary | ICD-10-CM | POA: Diagnosis not present

## 2017-01-04 DIAGNOSIS — M5136 Other intervertebral disc degeneration, lumbar region: Secondary | ICD-10-CM | POA: Diagnosis not present

## 2017-01-04 NOTE — Telephone Encounter (Signed)
Michelle Gregory (DPR signed) left v/m requesting last weight left on home phone v/m.  04/13/16 pts weight was 139 lbs 4 oz. Left v/m as instructed.

## 2017-01-14 ENCOUNTER — Ambulatory Visit: Payer: MEDICARE | Admitting: Podiatry

## 2017-01-18 ENCOUNTER — Encounter: Payer: Self-pay | Admitting: Podiatry

## 2017-01-18 ENCOUNTER — Ambulatory Visit (INDEPENDENT_AMBULATORY_CARE_PROVIDER_SITE_OTHER): Payer: MEDICARE | Admitting: Podiatry

## 2017-01-18 DIAGNOSIS — M79676 Pain in unspecified toe(s): Secondary | ICD-10-CM

## 2017-01-18 DIAGNOSIS — B351 Tinea unguium: Secondary | ICD-10-CM | POA: Diagnosis not present

## 2017-01-18 NOTE — Progress Notes (Signed)
Complaint:  Visit Type: Patient returns to my office for continued preventative foot care services. Complaint: Patient states" my nails have grown long and thick and become painful to walk and wear shoes" Patient has been diagnosed with DM with no foot complications. The patient presents for preventative foot care services. No changes to ROS.  Painful fourth toe right foot   Podiatric Exam: Vascular: dorsalis pedis and posterior tibial pulses are palpable bilateral. Capillary return is immediate. Temperature gradient is WNL. Skin turgor WNL  Sensorium: Normal Semmes Weinstein monofilament test. Normal tactile sensation bilaterally. Nail Exam: Pt has thick disfigured discolored nails with subungual debris noted bilateral entire nail hallux through fifth toenails Ulcer Exam: There is no evidence of ulcer or pre-ulcerative changes or infection. Orthopedic Exam: Muscle tone and strength are WNL. No limitations in general ROM. No crepitus or effusions noted. Foot type and digits show no abnormalities. Bony prominences are unremarkable. Possible tophus fourth toe right foot. Skin: No Porokeratosis. No infection or ulcers  Diagnosis:  Onychomycosis, , Pain in right toe, pain in left toes  Treatment & Plan Procedures and Treatment: Consent by patient was obtained for treatment procedures. The patient understood the discussion of treatment and procedures well. All questions were answered thoroughly reviewed. Debridement of mycotic and hypertrophic toenails, 1 through 5 bilateral and clearing of subungual debris. No ulceration, no infection noted.  Return Visit-Office Procedure: Patient instructed to return to the office for a follow up visit 3 months for continued evaluation and treatment.    Gardiner Barefoot DPM

## 2017-03-03 ENCOUNTER — Encounter: Payer: Self-pay | Admitting: Family Medicine

## 2017-03-03 ENCOUNTER — Ambulatory Visit (INDEPENDENT_AMBULATORY_CARE_PROVIDER_SITE_OTHER): Payer: MEDICARE | Admitting: Family Medicine

## 2017-03-03 VITALS — BP 130/76 | HR 87 | Temp 98.3°F | Ht 60.5 in | Wt 127.2 lb

## 2017-03-03 DIAGNOSIS — N8110 Cystocele, unspecified: Secondary | ICD-10-CM | POA: Diagnosis not present

## 2017-03-03 DIAGNOSIS — R440 Auditory hallucinations: Secondary | ICD-10-CM | POA: Diagnosis not present

## 2017-03-03 DIAGNOSIS — R634 Abnormal weight loss: Secondary | ICD-10-CM | POA: Diagnosis not present

## 2017-03-03 DIAGNOSIS — R3915 Urgency of urination: Secondary | ICD-10-CM

## 2017-03-03 DIAGNOSIS — I10 Essential (primary) hypertension: Secondary | ICD-10-CM | POA: Diagnosis not present

## 2017-03-03 LAB — POC URINALSYSI DIPSTICK (AUTOMATED)
Bilirubin, UA: NEGATIVE
Blood, UA: NEGATIVE
Glucose, UA: NEGATIVE
Ketones, UA: NEGATIVE
Nitrite, UA: NEGATIVE
Spec Grav, UA: 1.02 (ref 1.010–1.025)
Urobilinogen, UA: 0.2 E.U./dL
pH, UA: 6.5 (ref 5.0–8.0)

## 2017-03-03 NOTE — Patient Instructions (Signed)
This may be auditory hallucination (vs dreaming)  This may be from medications (narcotic/lyrica) or age or infection  Urine tests today -to see if a uti  Keep drinking lots of water   Watch for further memory or personality change or confusion   Labs today

## 2017-03-03 NOTE — Progress Notes (Signed)
Subjective:    Patient ID: Michelle Gregory, female    DOB: [birth year], 83 y.o.   MRN: [id number]  HPI  Here for urinary symptoms  Also c/o "hearing voices at night"  Hx of cystocele  Frequent urination- worsened lately for the past few days  Incontinent - has to wear an undergarment  Some burning to urinate  ? Infection  No urinary odor  No blood in urine  Chronic back pain - ? If flank pain    She thinks she has had a hysterectomy with colon surgery-but not sure   Thinks she hears her daughter and SIL talking - sounds like they are up above her  About 3 weeks  Usually very mentally sharp    Wt Readings from Last 3 Encounters:  03/03/17 127 lb 4 oz (57.7 kg)  09/07/16 135 lb 12 oz (61.6 kg)  04/13/16 139 lb 4 oz (63.2 kg)   bmi 24.4  bp is stable today  No cp or palpitations or headaches or edema  No side effects to medicines  BP Readings from Last 3 Encounters:  03/03/17 130/76  09/07/16 118/70  04/13/16 110/66     Has a hx of anxiety  Takes paxil 30 mg daily at night  Also xanax prn  She takes lyrica from another doctor for chronic back (disk) disease  Pain medication -hydrocodone    Patient Active Problem List   Diagnosis Date Noted  . Auditory hallucinations 03/03/2017  . Weight loss 03/03/2017  . Female cystocele 04/13/2016  . Urinary urgency 02/17/2016  . Bowel habit changes 12/30/2015  . Compression fracture 12/30/2015  . Excessive cerumen in right ear canal 05/27/2015  . Ingrown toenail without infection 05/04/2015  . Routine general medical examination at a health care facility 04/09/2015  . Candidal intertrigo 03/28/2014  . Excessive cerumen in both ear canals 01/17/2014  . Encounter for Medicare annual wellness exam 03/21/2013  . Gout 09/19/2012  . Osteopenia 05/05/2011  . Degenerative lumbar disc 01/20/2011  . VARICOSE VEINS, LOWER EXTREMITIES 09/04/2010  . Hyperglycemia 01/08/2010  . Hyperlipidemia 05/09/2008  . GOUT 05/09/2008  .  Anxiety state 05/09/2008  . Essential hypertension 05/09/2008  . HEMORRHOIDS, INTERNAL 05/09/2008  . Allergic rhinitis 05/09/2008  . Mild reactive airways disease 05/09/2008  . GERD 05/09/2008  . IBS 05/09/2008  . OSTEOARTHRITIS 05/09/2008  . History of malignant neoplasm of large intestine 05/09/2008   Past Medical History:  Diagnosis Date  . Allergic rhinitis   . Anxiety   . Cancer ([place])   . Depression   . GERD (gastroesophageal reflux disease)   . History of colon cancer   . Hyperlipidemia   . Hypertension   . Osteoarthritis   . Spinal compression fracture ([place])   . Vertigo    Past Surgical History:  Procedure Laterality Date  . APPENDECTOMY    . CATARACT EXTRACTION    . COLON RESECTION    . TONSILLECTOMY     Social History  Substance Use Topics  . Smoking status: Never Smoker  . Smokeless tobacco: Never Used  . Alcohol use No   Family History  Problem Relation Age of Onset  . Prostate cancer Father   . Heart attack Mother   . Gout Mother    Allergies  Allergen Reactions  . Amoxicillin-Pot Clavulanate     REACTION: diarrhea  . Keflex [Cephalexin] Diarrhea  . Septra [Sulfamethoxazole-Trimethoprim] Nausea Only   Current Outpatient Prescriptions on File Prior to Visit  Medication Sig Dispense  Refill  . allopurinol (ZYLOPRIM) 100 MG tablet TAKE 2 TABLETS BY MOUTH  DAILY 180 tablet 0  . ALPRAZolam (XANAX) 0.5 MG tablet 1/2 pill by mouth up to twice daily as needed for anxiety 30 tablet 2  . Ascorbic Acid (VITAMIN C) 100 MG tablet Take 100 mg by mouth daily.    Marland Kitchen aspirin 81 MG tablet Take 81 mg by mouth daily.      Marland Kitchen BIOTIN PO Take 1 tablet by mouth daily.    . calcium-vitamin D (CALCIUM 500 +D) 500 MG tablet Take 1 tablet by mouth daily.     . diphenoxylate-atropine (LOMOTIL) 2.5-0.025 MG tablet Take 1 tablet by mouth 4 (four) times daily as needed for diarrhea or loose stools. 30 tablet 0  . Docusate Calcium (STOOL SOFTENER PO) Take 1 tablet by mouth daily.     Marland Kitchen glucosamine-chondroitin 500-400 MG tablet Take 1 tablet by mouth daily.    Marland Kitchen HYDROcodone-acetaminophen (VICODIN) 5-500 MG per tablet Take 1 tablet by mouth See admin instructions. Take 5 tablets by mouth daily per daughter    . losartan (COZAAR) 100 MG tablet TAKE 1 TABLET BY MOUTH  DAILY 90 tablet 0  . magnesium citrate SOLN Take 148 mLs (0.5 Bottles total) by mouth once. Repeat dose in 6 hours if no bowel movement. 195 mL 1  . meclizine (ANTIVERT) 25 MG tablet TAKE 1 TABLET (25 MG TOTAL) BY MOUTH 3 (THREE) TIMES  DAILY AS NEEDED. 270 tablet 0  . metoprolol tartrate (LOPRESSOR) 25 MG tablet TAKE 1 TABLET BY MOUTH  TWICE A DAY 180 tablet 0  . multivitamin (THERAGRAN) per tablet Take 1 tablet by mouth daily.      Marland Kitchen PARoxetine (PAXIL) 30 MG tablet TAKE 1 TABLET BY MOUTH  EVERY MORNING (Patient taking differently: TAKE 1 TABLET BY MOUTH AT NIGHT) 90 tablet 0  . pregabalin (LYRICA) 50 MG capsule Take 50 mg by mouth 2 (two) times daily.     . raloxifene (EVISTA) 60 MG tablet Take 1 tablet (60 mg total) by mouth daily. 90 tablet 3   No current facility-administered medications on file prior to visit.     Review of Systems Review of Systems  Constitutional: Negative for fever, fatigue and unexpected weight change. pos for wt loss expected from dec po intake Eyes: Negative for pain and visual disturbance.  Respiratory: Negative for cough and shortness of breath.   Cardiovascular: Negative for cp or palpitations    Gastrointestinal: Negative for nausea, diarrhea and constipation.  Genitourinary: pos for urgency and frequency. pos for occ dysuria/ neg for hematuria  Skin: Negative for pallor or rash  pos for chronic back pain that causes mobility impairment  Neurological: Negative for weakness, light-headedness, numbness and headaches.  Hematological: Negative for adenopathy. Does not bruise/bleed easily.  Psychiatric/Behavioral: Negative for dysphoric mood. The patient is not nervous/anxious. Pos  for auditory hallucinations or vivid dreaming / neg for known dementia/confusion or sundowning  In the past        Objective:   Physical Exam  Constitutional: She appears well-developed and well-nourished. No distress.  Well appearing elderly female/mobility impaired   HENT:  Head: Normocephalic and atraumatic.  Hard of hearing   Eyes: Conjunctivae and EOM are normal. Pupils are equal, round, and reactive to light.  Neck: Normal range of motion. Neck supple.  Cardiovascular: Normal rate, regular rhythm and normal heart sounds.   Pulmonary/Chest: Effort normal and breath sounds normal. No respiratory distress. She has no wheezes. She has  no rales.  Abdominal: Soft. Bowel sounds are normal. She exhibits no distension. There is no hepatosplenomegaly. There is no tenderness. There is no rebound and no CVA tenderness.  No cva tenderness  No  suprapubic tenderness  Musculoskeletal: She exhibits no edema.  Lymphadenopathy:    She has no cervical adenopathy.  Neurological: She is alert. No cranial nerve deficit. She exhibits normal muscle tone. Coordination normal.  Skin: No rash noted. No pallor.  Psychiatric: She has a normal mood and affect.  Mentally sharp today  HOH - but answers all questions appropriately          Assessment & Plan:   Problem List Items Addressed This Visit      Cardiovascular and Mediastinum   Essential hypertension - Primary   Relevant Orders   CBC with Differential/Platelet   Comprehensive metabolic panel   TSH     Genitourinary   Female cystocele   Relevant Orders   Ambulatory referral to Urology     Other   Auditory hallucinations    Unsure of hallucinations or dreaming Generally mentally 72 hx of dementia or sundowning  Is on several medications that could change mental status incl hydrocodone/ lyrica and xanax prn  Will evaluate these  Also check for uti  Disc nutrition/ fluid intake and sleep hygeine Labs for metabolic abn today    Will follow closely /family cares for her       Urinary urgency    Ongoing frequency and urgency with hx of cystocele and overactive bladder Perhaps worse this wk  Wbc in ua (not uncommon for her)- sent for culture Ref to urology as requested by pt as well  Disc imp of good hydration and wiping front to back       Relevant Orders   Ambulatory referral to Urology   Urine culture   POCT Urinalysis Dipstick (Automated) (Completed)   Weight loss    Pt sites decreased appetite and quick satiety She thinks this is age related  Enc more frequent small meals or snacks with protein  Will continue to follow

## 2017-03-03 NOTE — Progress Notes (Signed)
Pre visit review using our clinic review tool, if applicable. No additional management support is needed unless otherwise documented below in the visit note. 

## 2017-03-04 ENCOUNTER — Telehealth: Payer: Self-pay | Admitting: Family Medicine

## 2017-03-04 LAB — COMPREHENSIVE METABOLIC PANEL
ALT: 9 U/L (ref 0–35)
AST: 18 U/L (ref 0–37)
Albumin: 4.1 g/dL (ref 3.5–5.2)
Alkaline Phosphatase: 52 U/L (ref 39–117)
BUN: 16 mg/dL (ref 6–23)
CO2: 36 mEq/L — ABNORMAL HIGH (ref 19–32)
Calcium: 9.6 mg/dL (ref 8.4–10.5)
Chloride: 99 mEq/L (ref 96–112)
Creatinine, Ser: 0.84 mg/dL (ref 0.40–1.20)
GFR: 67.4 mL/min (ref >60.00)
Glucose, Bld: 93 mg/dL (ref 70–99)
Potassium: 4.2 mEq/L (ref 3.5–5.1)
Sodium: 138 mEq/L (ref 135–145)
Total Bilirubin: 0.4 mg/dL (ref 0.2–1.2)
Total Protein: 6.2 g/dL (ref 6.0–8.3)

## 2017-03-04 LAB — CBC WITH DIFFERENTIAL/PLATELET
Basophils Absolute: 0.1 10*3/uL (ref 0.0–0.1)
Basophils Relative: 0.9 % (ref 0.0–3.0)
Eosinophils Absolute: 0.4 10*3/uL (ref 0.0–0.7)
Eosinophils Relative: 5.9 % — ABNORMAL HIGH (ref 0.0–5.0)
HCT: 40.4 % (ref 36.0–46.0)
Hemoglobin: 13.6 g/dL (ref 12.0–15.0)
Lymphocytes Relative: 21.4 % (ref 12.0–46.0)
Lymphs Abs: 1.3 10*3/uL (ref 0.7–4.0)
MCHC: 33.7 g/dL (ref 30.0–36.0)
MCV: 100.3 fl — ABNORMAL HIGH (ref 78.0–100.0)
Monocytes Absolute: 0.6 10*3/uL (ref 0.1–1.0)
Monocytes Relative: 9.6 % (ref 3.0–12.0)
Neutro Abs: 3.7 10*3/uL (ref 1.4–7.7)
Neutrophils Relative %: 62.2 % (ref 43.0–77.0)
Platelets: 158 10*3/uL (ref 150.0–400.0)
RBC: 4.03 Mil/uL (ref 3.87–5.11)
RDW: 13.4 % (ref 11.5–15.5)
WBC: 6 10*3/uL (ref 4.0–10.5)

## 2017-03-04 LAB — URINE CULTURE

## 2017-03-04 LAB — TSH: TSH: 1.79 u[IU]/mL (ref 0.35–4.50)

## 2017-03-04 NOTE — Telephone Encounter (Signed)
[name] returned Shapale's call.  [name] can be reached before 10:00 this morning.

## 2017-03-04 NOTE — Assessment & Plan Note (Signed)
Ongoing frequency and urgency with hx of cystocele and overactive bladder Perhaps worse this wk  Wbc in ua (not uncommon for her)- sent for culture Ref to urology as requested by pt as well  Disc imp of good hydration and wiping front to back

## 2017-03-04 NOTE — Assessment & Plan Note (Signed)
Unsure of hallucinations or dreaming Generally mentally sharp-no hx of dementia or sundowning  Is on several medications that could change mental status incl hydrocodone/ lyrica and xanax prn  Will evaluate these  Also check for uti  Disc nutrition/ fluid intake and sleep hygeine Labs for metabolic abn today  Will follow closely /family cares for her

## 2017-03-04 NOTE — Assessment & Plan Note (Signed)
Pt sites decreased appetite and quick satiety She thinks this is age related  Enc more frequent small meals or snacks with protein  Will continue to follow

## 2017-03-04 NOTE — Telephone Encounter (Signed)
Addressed through result notes  

## 2017-03-05 NOTE — Telephone Encounter (Signed)
Addressed through result notes  

## 2017-03-05 NOTE — Telephone Encounter (Signed)
Michelle Gregory returned your call Best number [phone]

## 2017-03-31 ENCOUNTER — Telehealth: Payer: Self-pay | Admitting: Family Medicine

## 2017-03-31 NOTE — Telephone Encounter (Signed)
Daughter notified of Dr. Marliss Coots comments/recommendations and verbalized understanding. Daughter will start with holding the lyrica 1st and update [place] on how pt's doing without it

## 2017-03-31 NOTE — Telephone Encounter (Signed)
We discussed this at her visit and talked about her mental state (no dementia known, etc)  Some of her medicines can have a side effect of hallucinations-namely lyrica and hydrocodone and xanax  We should go down the list and see if this may be medication related  First-I would like her to hold the lyrica for now- if no improvement in hallucinations then we need to try holding the hydrocodone  At advancing ages we become more sensitive to medications and can develop some strange side effects   If no further improvement then it would be worthwhile getting a psychiatry consult   So, hold the lyrica first and let me know how it goes Thanks for updating me

## 2017-03-31 NOTE — Telephone Encounter (Signed)
[name] called asking for a call back to discuss things going on with [place].

## 2017-03-31 NOTE — Telephone Encounter (Signed)
Pt is hearing voices at night, and daughter said it's getting worse, daughter tries to tell pt that the voices are not real and pt gets really upset and irate. Daughter asked pt is she hearing voices because she doesn't want to stay by herself anymore and pt said that's not it she just knows she's hearing people in her home. Daughter request call back on what Dr. Glori Bickers suggest, because she is very worried about pt

## 2017-04-19 ENCOUNTER — Ambulatory Visit: Payer: MEDICARE | Admitting: Podiatry

## 2017-05-31 ENCOUNTER — Other Ambulatory Visit: Payer: Self-pay | Admitting: Family Medicine

## 2017-06-01 ENCOUNTER — Other Ambulatory Visit: Payer: Self-pay | Admitting: *Deleted

## 2017-06-01 ENCOUNTER — Other Ambulatory Visit: Payer: Self-pay | Admitting: Family Medicine

## 2017-06-01 DIAGNOSIS — F411 Generalized anxiety disorder: Secondary | ICD-10-CM

## 2017-06-01 MED ORDER — ALPRAZOLAM 0.5 MG PO TABS: ORAL_TABLET | ORAL | 3 refills | Status: DC

## 2017-06-01 NOTE — Telephone Encounter (Signed)
Pt's had a recent acute appt but no recent f/u or CPE, last filled on 06/29/16 #30 tabs with 2 additional refills, please advise

## 2017-06-01 NOTE — Telephone Encounter (Signed)
Before I refill that please check in with her daughter to see how she is doing  She was hearing voices at night/getting confused and thought it may be due to lyrica Did they stop it?  Is she doing better?  Thanks

## 2017-06-01 NOTE — Telephone Encounter (Signed)
Rx called in as prescribed 

## 2017-06-01 NOTE — Telephone Encounter (Signed)
Thanks for the update  Px written for call in

## 2017-06-01 NOTE — Telephone Encounter (Signed)
Pt has had a recent acute appt but no recent CPE or f/u, please advise

## 2017-06-01 NOTE — Telephone Encounter (Signed)
done

## 2017-06-01 NOTE — Telephone Encounter (Signed)
Please refill for 1 year

## 2017-06-01 NOTE — Telephone Encounter (Signed)
Daughter said pt is about the same, it hasn't gotten any worse and family can tolerate it. They didn't stop the lyrica but they did cut back on her night does of her hydrocodone-apap, pt will now go 3-4 days and be fine but then she may hear voices out of nowhere. Daughter said the voices pt is hearing is pt's daughter and her husband, she thinks they are upstairs and they are not but again daughter said it's tolerable. Daughter has been giving pt an 1/2 tab of xanax at night when she does get anxious and that seems to be helping

## 2017-06-04 DIAGNOSIS — N3941 Urge incontinence: Secondary | ICD-10-CM | POA: Diagnosis not present

## 2017-06-04 DIAGNOSIS — R350 Frequency of micturition: Secondary | ICD-10-CM | POA: Diagnosis not present

## 2017-06-04 DIAGNOSIS — N3944 Nocturnal enuresis: Secondary | ICD-10-CM | POA: Diagnosis not present

## 2017-06-09 ENCOUNTER — Ambulatory Visit (INDEPENDENT_AMBULATORY_CARE_PROVIDER_SITE_OTHER): Payer: MEDICARE | Admitting: Podiatry

## 2017-06-09 DIAGNOSIS — M79676 Pain in unspecified toe(s): Secondary | ICD-10-CM

## 2017-06-09 DIAGNOSIS — B351 Tinea unguium: Secondary | ICD-10-CM | POA: Diagnosis not present

## 2017-06-14 ENCOUNTER — Telehealth: Payer: Self-pay

## 2017-06-14 ENCOUNTER — Other Ambulatory Visit: Payer: MEDICARE

## 2017-06-14 DIAGNOSIS — R300 Dysuria: Secondary | ICD-10-CM

## 2017-06-14 NOTE — Telephone Encounter (Signed)
Left voicemail requesting pt's daughter to call the office back 

## 2017-06-14 NOTE — Telephone Encounter (Signed)
Thanks let me know

## 2017-06-14 NOTE — Telephone Encounter (Signed)
Pt's daughter said pt was very confused over the weekend and today pt stated she had some dysuria when urinating, daughter will drop off a urine sample today.

## 2017-06-14 NOTE — Telephone Encounter (Signed)
She gets these frequently so that is ok with me  Please ask what symptoms she is having

## 2017-06-14 NOTE — Telephone Encounter (Signed)
Michelle Gregory called and left a message on vm. Asked if they could bring in a a urine specimen to check for UTI because it was hard to bring her in. Did not leave any details. Please call her back.

## 2017-06-15 ENCOUNTER — Telehealth: Payer: Self-pay | Admitting: Family Medicine

## 2017-06-15 LAB — URINALYSIS, MICROSCOPIC ONLY
Casts: NONE SEEN [LPF]
Crystals: NONE SEEN [HPF]
RBC / HPF: NONE SEEN RBC/HPF (ref <=2)
Yeast: NONE SEEN [HPF]

## 2017-06-15 LAB — URINALYSIS, ROUTINE W REFLEX MICROSCOPIC
Bilirubin Urine: NEGATIVE
Glucose, UA: NEGATIVE
Hgb urine dipstick: NEGATIVE
Ketones, ur: NEGATIVE
Nitrite: POSITIVE — AB
Protein, ur: NEGATIVE
Specific Gravity, Urine: 1.006 (ref 1.001–1.035)
pH: 8.5 — ABNORMAL HIGH (ref 5.0–8.0)

## 2017-06-15 MED ORDER — NITROFURANTOIN MONOHYD MACRO 100 MG PO CAPS: 100.0000 mg | ORAL_CAPSULE | Freq: Two times a day (BID) | ORAL | 0 refills | Status: DC

## 2017-06-15 NOTE — Telephone Encounter (Signed)
Daughter notified of Dr. Tower's comments and Rx sent to pharmacy 

## 2017-06-15 NOTE — Telephone Encounter (Signed)
She may have a uti  Please call in macrobid Pending cx -I will be able to tell them more

## 2017-06-15 NOTE — Telephone Encounter (Signed)
Patient's daughter,[name],called to get patient's lab results.

## 2017-06-15 NOTE — Progress Notes (Signed)
   SUBJECTIVE Patient  presents to office today complaining of elongated, thickened nails. Pain while ambulating in shoes. Patient is unable to trim their own nails.   OBJECTIVE General Patient is awake, alert, and oriented x 3 and in no acute distress. Derm Skin is dry and supple bilateral. Negative open lesions or macerations. Remaining integument unremarkable. Nails are tender, long, thickened and dystrophic with subungual debris, consistent with onychomycosis, 1-5 bilateral. No signs of infection noted. Vasc  DP and PT pedal pulses palpable bilaterally. Temperature gradient within normal limits.  Neuro Epicritic and protective threshold sensation diminished bilaterally.  Musculoskeletal Exam No symptomatic pedal deformities noted bilateral. Muscular strength within normal limits.  ASSESSMENT 1. Onychodystrophic nails 1-5 bilateral with hyperkeratosis of nails.  2. Onychomycosis of nail due to dermatophyte bilateral 3. Pain in foot bilateral  PLAN OF CARE 1. Patient evaluated today.  2. Instructed to maintain good pedal hygiene and foot care.  3. Mechanical debridement of nails 1-5 bilaterally performed using a nail nipper. Filed with dremel without incident.  4. Return to clinic in 3 mos.    Brent M. Evans, DPM [facility]  Dr. Brent M. Evans, DPM    [address]                                        [place], [place] [address]                Office [phone]  Fax [phone]      

## 2017-06-16 LAB — URINE CULTURE

## 2017-06-29 ENCOUNTER — Telehealth: Payer: Self-pay | Admitting: Family Medicine

## 2017-06-29 NOTE — Telephone Encounter (Signed)
Patient Name: Michelle Gregory  DOB: 18-Jul-1923    Initial Comment needs to have a nurse call her back   Nurse Assessment  Nurse: [name], RN, Joycelyn Schmid Date/Time (Eastern Time): 06/29/2017 3:22:22 PM  Confirm and document reason for call. If symptomatic, describe symptoms. ---Caller states that mother fell this morning and when she fell she hit the door knob don't think she broke anything but she is complaining of right side pain.  Does the patient have any new or worsening symptoms? ---Yes  Will a triage be completed? ---Yes  Related visit to physician within the last 2 weeks? ---No  Does the PT have any chronic conditions? (i.e. diabetes, asthma, etc.) ---No  Is this a behavioral health or substance abuse call? ---No     Guidelines    Guideline Title Affirmed Question Affirmed Notes  Abdominal Injury [1] Abdominal pain (not severe) AND [2] present < 1 hour    Final Disposition User   [facility], RN, Joycelyn Schmid    Comments  side pain is a 9/10 if leaning on it.   Disagree/Comply: Comply

## 2017-06-29 NOTE — Telephone Encounter (Signed)
Tried to call both primary and secondary numbers listed, no answer and no VM available.

## 2017-06-29 NOTE — Telephone Encounter (Signed)
If pain is severe - go to ED now for eval  Otherwise please check in with family tomorrow to see how she is doing Thanks

## 2017-06-29 NOTE — Telephone Encounter (Signed)
PLEASE NOTE: All timestamps contained within this report are represented as [place] Standard Time. CONFIDENTIALTY NOTICE: This fax transmission is intended only for the addressee. It contains information that is legally privileged, confidential or otherwise protected from use or disclosure. If you are not the intended recipient, you are strictly prohibited from reviewing, disclosing, copying using or disseminating any of this information or taking any action in reliance on or regarding this information. If you have received this fax in error, please notify us immediately by telephone so that we can arrange for its return to [place]. Phone: [phone], Toll-Free: [phone], Fax: [phone] Page: 1 of 2 Call Id: [id number] [place] Patient Name: Michelle Gregory Gender: Female DOB: Apr 30, 1923 Age: 83 Y 34 M 2 D Return Phone Number: [phone] (Primary), [phone] (Secondary) City/State/Zip: [place] [address] Client [facility] - Client Client Site [place] - Day Physician Glori Bickers, Roque Lias - MD Who Is Calling Patient / Member / Family / Caregiver Call Type Triage / Clinical Caller Name Michelle Gregory Relationship To Patient Mother Return Phone Number [phone] (Secondary) Chief Complaint Fall - No Injury Reason for Call Symptomatic / Request for Health Information Initial Comment needs to have a nurse call her back Additional Comment Daughetr doent think anything is broken but her mother is 75 Years old and would like to speak to a nurse. Appointment Disposition EMR Appointment Not Necessary Info pasted into Epic Yes Nurse Assessment Nurse: [name], RN, [name] Date/Time (Eastern Time): 06/29/2017 3:22:22 PM Confirm and document reason for call. If symptomatic, describe symptoms. ---Caller states that mother fell this morning and when she fell she hit the  door knob don't think she broke anything but she is complaining of right side pain. Does the PT have any chronic conditions? (i.e. diabetes, asthma, etc.) ---No Guidelines Guideline Title Affirmed Question Abdominal Injury [1] Abdominal pain (not severe) AND [2] present < 1 hour Disp. Time [name] Time) Disposition Final User 06/29/2017 3:30:19 [name], RN, [facility] Advice Given Per Guideline HOME CARE: You should be able to treat this at home. REASSURANCE AND EDUCATION: * A blow (like a punch) to the abdomen can often cause some temporary pain. * Usually the pain goes away in 20 to 30 minutes. If it does not go away, you need to be examined. REST: Lie down and rest until you feel better. DIET: CALL BACK IF: * Pain lasts over 1 hour Patient has side pain due to a fall instructed that if pain lasts over 2 hours to call back. * You become worse. CARE ADVICE given per Abdominal Injury (Adult) guideline. Comments User: [name], RN Date/Time [name] Time): 06/29/2017 3:26:51 PM PLEASE NOTE: All timestamps contained within this report are represented as [place] Standard Time. CONFIDENTIALTY NOTICE: This fax transmission is intended only for the addressee. It contains information that is legally privileged, confidential or otherwise protected from use or disclosure. If you are not the intended recipient, you are strictly prohibited from reviewing, disclosing, copying using or disseminating any of this information or taking any action in reliance on or regarding this information. If you have received this fax in error, please notify us immediately by telephone so that we can arrange for its return to [place]. Phone: [phone], Toll-Free: [phone], Fax: [phone] Page: 2 of 2 Call Id: [id number] Comments side pain is a 9/10 if leaning on it.

## 2017-07-01 NOTE — Telephone Encounter (Signed)
Spoke with daughter [name] and she advise me that pt didn't fall all the to the floor, but she stumbled and hit her side on the door nob. Pt's side is pretty sore were door nob hit but other then that daughter said pt is doing okay, she is walking ok, breathing and breathing normal too. She does complain of her side being sore but they haven't put anything on it. Daughter said she will start putting an ice or heating pad on her side but she said pt is fine and she just knows with pt's age it's going to take a little longer for her bruise to heal. Daughter will keep an eye on pt and if she gets any worse or develops any new sxs she will call us but for now pt's okay and daughter declined to schedule an appt

## 2017-07-06 DIAGNOSIS — G894 Chronic pain syndrome: Secondary | ICD-10-CM | POA: Diagnosis not present

## 2017-07-06 DIAGNOSIS — M5136 Other intervertebral disc degeneration, lumbar region: Secondary | ICD-10-CM | POA: Diagnosis not present

## 2017-08-10 ENCOUNTER — Emergency Department (HOSPITAL_BASED_OUTPATIENT_CLINIC_OR_DEPARTMENT_OTHER)
Admission: EM | Admit: 2017-08-10 | Discharge: 2017-08-10 | Disposition: A | Discharge status: Home / Self Care | Payer: MEDICARE | Attending: Emergency Medicine | Admitting: Emergency Medicine

## 2017-08-10 ENCOUNTER — Encounter (HOSPITAL_BASED_OUTPATIENT_CLINIC_OR_DEPARTMENT_OTHER): Payer: Self-pay | Admitting: Emergency Medicine

## 2017-08-10 DIAGNOSIS — R1110 Vomiting, unspecified: Secondary | ICD-10-CM | POA: Diagnosis not present

## 2017-08-10 DIAGNOSIS — Z85038 Personal history of other malignant neoplasm of large intestine: Secondary | ICD-10-CM | POA: Insufficient documentation

## 2017-08-10 DIAGNOSIS — Z79899 Other long term (current) drug therapy: Secondary | ICD-10-CM | POA: Diagnosis not present

## 2017-08-10 DIAGNOSIS — N3001 Acute cystitis with hematuria: Secondary | ICD-10-CM | POA: Diagnosis not present

## 2017-08-10 DIAGNOSIS — I10 Essential (primary) hypertension: Secondary | ICD-10-CM | POA: Insufficient documentation

## 2017-08-10 DIAGNOSIS — R197 Diarrhea, unspecified: Secondary | ICD-10-CM | POA: Diagnosis not present

## 2017-08-10 DIAGNOSIS — R112 Nausea with vomiting, unspecified: Secondary | ICD-10-CM | POA: Diagnosis not present

## 2017-08-10 DIAGNOSIS — Z7982 Long term (current) use of aspirin: Secondary | ICD-10-CM | POA: Insufficient documentation

## 2017-08-10 DIAGNOSIS — R300 Dysuria: Secondary | ICD-10-CM | POA: Insufficient documentation

## 2017-08-10 DIAGNOSIS — R110 Nausea: Secondary | ICD-10-CM | POA: Diagnosis not present

## 2017-08-10 LAB — COMPREHENSIVE METABOLIC PANEL
ALT: 14 U/L (ref 14–54)
AST: 35 U/L (ref 15–41)
Albumin: 3.8 g/dL (ref 3.5–5.0)
Alkaline Phosphatase: 61 U/L (ref 38–126)
Anion gap: 13 (ref 5–15)
BUN: 24 mg/dL — ABNORMAL HIGH (ref 6–20)
CO2: 26 mmol/L (ref 22–32)
Calcium: 9.3 mg/dL (ref 8.9–10.3)
Chloride: 95 mmol/L — ABNORMAL LOW (ref 101–111)
Creatinine, Ser: 0.99 mg/dL (ref 0.44–1.00)
GFR calc Af Amer: 56 mL/min — ABNORMAL LOW (ref >60)
GFR calc non Af Amer: 48 mL/min — ABNORMAL LOW (ref >60)
Glucose, Bld: 156 mg/dL — ABNORMAL HIGH (ref 65–99)
Potassium: 3.7 mmol/L (ref 3.5–5.1)
Sodium: 134 mmol/L — ABNORMAL LOW (ref 135–145)
Total Bilirubin: 1.2 mg/dL (ref 0.3–1.2)
Total Protein: 6.8 g/dL (ref 6.5–8.1)

## 2017-08-10 LAB — URINALYSIS, ROUTINE W REFLEX MICROSCOPIC
Bilirubin Urine: NEGATIVE
Glucose, UA: NEGATIVE mg/dL
Ketones, ur: 15 mg/dL — AB
Nitrite: NEGATIVE
Protein, ur: 100 mg/dL — AB
Specific Gravity, Urine: 1.01 (ref 1.005–1.030)
pH: 7 (ref 5.0–8.0)

## 2017-08-10 LAB — URINALYSIS, MICROSCOPIC (REFLEX)

## 2017-08-10 LAB — CBC
HCT: 39.5 % (ref 36.0–46.0)
Hemoglobin: 13.5 g/dL (ref 12.0–15.0)
MCH: 32.8 pg (ref 26.0–34.0)
MCHC: 34.2 g/dL (ref 30.0–36.0)
MCV: 96.1 fL (ref 78.0–100.0)
Platelets: ADEQUATE 10*3/uL (ref 150–400)
RBC: 4.11 MIL/uL (ref 3.87–5.11)
RDW: 12 % (ref 11.5–15.5)
WBC: 11.5 10*3/uL — ABNORMAL HIGH (ref 4.0–10.5)

## 2017-08-10 LAB — LIPASE, BLOOD: Lipase: 24 U/L (ref 11–51)

## 2017-08-10 MED ORDER — ONDANSETRON 4 MG PO TBDP: 4.0000 mg | ORAL_TABLET | Freq: Three times a day (TID) | ORAL | 0 refills | Status: DC | PRN

## 2017-08-10 MED ORDER — CIPROFLOXACIN HCL 500 MG PO TABS: 500.0000 mg | ORAL_TABLET | Freq: Two times a day (BID) | ORAL | 0 refills | Status: DC

## 2017-08-10 MED ORDER — SODIUM CHLORIDE 0.9 % IV BOLUS (SEPSIS)
1000.0000 mL | Freq: Once | INTRAVENOUS | Status: AC
  Administered 2017-08-10: 1000 mL via INTRAVENOUS

## 2017-08-10 MED ORDER — ONDANSETRON HCL 4 MG/2ML IJ SOLN
4.0000 mg | Freq: Once | INTRAMUSCULAR | Status: AC
  Administered 2017-08-10: 4 mg via INTRAVENOUS
  Filled 2017-08-10: qty 2

## 2017-08-10 MED FILL — CIPROFLOXACIN HCL 500 MG TA: 500 | 7 days supply | Qty: 14 | Fill #0

## 2017-08-10 MED FILL — ONDANSETRON ODT 4 MG TABLET: 4 | 4 days supply | Qty: 6 | Fill #0

## 2017-08-10 NOTE — ED Provider Notes (Signed)
[facility] [facility]PT [facility] Provider Note   CSN: [id number] Arrival date & time: 08/10/17  1433     History   Chief Complaint Chief Complaint  Patient presents with  . Diarrhea    HPI MALYIAH FELLOWS is a 83 y.o. female.  83yo F w/ PMH including HTN, HLD, colon CA, GERD who p/w diarrhea. Last night she began having diarrhea and has had multiple episodes including 2-3 episodes today before she came in. 2 episodes of vomiting today. She endorses dysuria since the diarrhea started. She's had some bright red blood which daughter states is related to external hemorrhoids. No recent antibiotic use. No abdominal pain, cough or cold sx.   The history is provided by the patient.  Diarrhea      Past Medical History:  Diagnosis Date  . Allergic rhinitis   . Anxiety   . Cancer ([place])   . Depression   . GERD (gastroesophageal reflux disease)   . History of colon cancer   . Hyperlipidemia   . Hypertension   . Osteoarthritis   . Spinal compression fracture ([place])   . Vertigo     Patient Active Problem List   Diagnosis Date Noted  . Auditory hallucinations 03/03/2017  . Weight loss 03/03/2017  . Female cystocele 04/13/2016  . Urinary urgency 02/17/2016  . Bowel habit changes 12/30/2015  . Compression fracture 12/30/2015  . Excessive cerumen in right ear canal 05/27/2015  . Ingrown toenail without infection 05/04/2015  . Routine general medical examination at a health care facility 04/09/2015  . Candidal intertrigo 03/28/2014  . Excessive cerumen in both ear canals 01/17/2014  . Encounter for Medicare annual wellness exam 03/21/2013  . Gout 09/19/2012  . Osteopenia 05/05/2011  . Degenerative lumbar disc 01/20/2011  . VARICOSE VEINS, LOWER EXTREMITIES 09/04/2010  . Hyperglycemia 01/08/2010  . Hyperlipidemia 05/09/2008  . GOUT 05/09/2008  . Anxiety state 05/09/2008  . Essential hypertension 05/09/2008  . HEMORRHOIDS, INTERNAL 05/09/2008  . Allergic rhinitis 05/09/2008  . Mild  reactive airways disease 05/09/2008  . GERD 05/09/2008  . IBS 05/09/2008  . OSTEOARTHRITIS 05/09/2008  . History of malignant neoplasm of large intestine 05/09/2008    Past Surgical History:  Procedure Laterality Date  . APPENDECTOMY    . CATARACT EXTRACTION    . COLON RESECTION    . TONSILLECTOMY      OB History    No data available       Home Medications    Prior to Admission medications   Medication Sig Start Date End Date Taking? Authorizing Provider  allopurinol (ZYLOPRIM) 100 MG tablet TAKE 2 TABLETS BY MOUTH  DAILY 06/01/17   Tower, [name], MD  ALPRAZolam Duanne Moron) 0.5 MG tablet 1/2 pill by mouth up to twice daily as needed for anxiety 06/01/17   Tower, [name], MD  Ascorbic Acid (VITAMIN C) 100 MG tablet Take 100 mg by mouth daily.    [provider]  aspirin 81 MG tablet Take 81 mg by mouth daily.      [provider]  BIOTIN PO Take 1 tablet by mouth daily.    [provider]  calcium-vitamin D (CALCIUM 500 +D) 500 MG tablet Take 1 tablet by mouth daily.     [provider]  ciprofloxacin (CIPRO) 500 MG tablet Take 1 tablet (500 mg total) by mouth 2 (two) times daily. 08/10/17   Little, Wenda Overland, MD  diphenoxylate-atropine (LOMOTIL) 2.5-0.025 MG tablet Take 1 tablet by mouth 4 (four) times daily  as needed for diarrhea or loose stools. 12/24/15   Tanna Furry, MD  Docusate Calcium (STOOL SOFTENER PO) Take 1 tablet by mouth daily.    [provider]  glucosamine-chondroitin 500-400 MG tablet Take 1 tablet by mouth daily.    [provider]  HYDROcodone-acetaminophen (VICODIN) 5-500 MG per tablet Take 1 tablet by mouth See admin instructions. Take 5 tablets by mouth daily per daughter    [provider]  losartan (COZAAR) 100 MG tablet TAKE 1 TABLET BY MOUTH  DAILY 06/01/17   Tower, [name], MD  magnesium citrate SOLN Take 148 mLs (0.5 Bottles total) by mouth once. Repeat dose in 6 hours if no bowel movement.  12/18/15   Charlesetta Shanks, MD  meclizine (ANTIVERT) 25 MG tablet TAKE 1 TABLET (25 MG TOTAL) BY MOUTH 3 (THREE) TIMES  DAILY AS NEEDED. 12/11/16   Tower, [name], MD  metoprolol tartrate (LOPRESSOR) 25 MG tablet TAKE 1 TABLET BY MOUTH  TWICE A DAY 06/01/17   Tower, [name], MD  multivitamin [facility]) per tablet Take 1 tablet by mouth daily.      [provider]  nitrofurantoin, macrocrystal-monohydrate, (MACROBID) 100 MG capsule Take 1 capsule (100 mg total) by mouth 2 (two) times daily. 06/15/17   Tower, [name], MD  ondansetron (ZOFRAN ODT) 4 MG disintegrating tablet Take 1 tablet (4 mg total) by mouth every 8 (eight) hours as needed for nausea or vomiting. 08/10/17   Little, Wenda Overland, MD  PARoxetine (PAXIL) 30 MG tablet TAKE 1 TABLET BY MOUTH  EVERY MORNING 06/01/17   Tower, [name], MD  pregabalin (LYRICA) 50 MG capsule Take 50 mg by mouth 2 (two) times daily.     [provider]  raloxifene (EVISTA) 60 MG tablet TAKE 1 TABLET BY MOUTH DAILY. 06/01/17   Tower, [name], MD    Family History Family History  Problem Relation Age of Onset  . Prostate cancer Father   . Heart attack Mother   . Gout Mother     Social History Social History  Substance Use Topics  . Smoking status: Never Smoker  . Smokeless tobacco: Never Used  . Alcohol use No     Allergies   Amoxicillin-pot clavulanate; Keflex [cephalexin]; and Septra [sulfamethoxazole-trimethoprim]   Review of Systems Review of Systems  Gastrointestinal: Positive for diarrhea.   All other systems reviewed and are negative except that which was mentioned in HPI   Physical Exam Updated Vital Signs BP 134/73   Pulse 90   Temp 98.1 F (36.7 C) (Oral)   Resp (!) 21   Wt 57.6 kg (127 lb)   LMP 11/02/1980   SpO2 95%   BMI 24.39 kg/m   Physical Exam  Constitutional: She is oriented to person, place, and time. She appears well-developed and well-nourished. No distress.  HENT:  Head: Normocephalic and  atraumatic.  dry mucous membranes  Eyes: Pupils are equal, round, and reactive to light. Conjunctivae are normal.  Neck: Neck supple.  Cardiovascular: Normal rate, regular rhythm and normal heart sounds.   No murmur heard. Pulmonary/Chest: Effort normal and breath sounds normal.  Abdominal: Soft. Bowel sounds are normal. She exhibits no distension. There is no tenderness.  Musculoskeletal: She exhibits no edema.  Neurological: She is alert and oriented to person, place, and time.  Fluent speech  Skin: Skin is warm and dry.  Psychiatric: She has a normal mood and affect. Judgment normal.  Nursing note and vitals reviewed.    ED  Treatments / Results  Labs (all labs ordered are listed, but only abnormal results are displayed) Labs Reviewed  COMPREHENSIVE METABOLIC PANEL - Abnormal; Notable for the following:       Result Value   Sodium 134 (*)    Chloride 95 (*)    Glucose, Bld 156 (*)    BUN 24 (*)    GFR calc non Af Amer 48 (*)    GFR calc Af Amer 56 (*)    All other components within normal limits  CBC - Abnormal; Notable for the following:    WBC 11.5 (*)    All other components within normal limits  URINALYSIS, ROUTINE W REFLEX MICROSCOPIC - Abnormal; Notable for the following:    APPearance CLOUDY (*)    Hgb urine dipstick MODERATE (*)    Ketones, ur 15 (*)    Protein, ur 100 (*)    Leukocytes, UA LARGE (*)    All other components within normal limits  URINALYSIS, MICROSCOPIC (REFLEX) - Abnormal; Notable for the following:    Bacteria, UA MANY (*)    Squamous Epithelial / LPF 0-5 (*)    All other components within normal limits  URINE CULTURE  LIPASE, BLOOD    EKG  EKG Interpretation  Date/Time:  Tuesday August 10 2017 15:02:14 EDT Ventricular Rate:  99 PR Interval:    QRS Duration: 86 QT Interval:  373 QTC Calculation: 479 R Axis:   75 Text Interpretation:  Sinus tachycardia Atrial premature complexes Anterior infarct, old tachycardia and atrial  premature beats new from previous Confirmed by Theotis Burrow [phone]) on 08/10/2017 3:09:04 PM       Radiology No results found.  Procedures Procedures (including critical care time)  Medications Ordered in ED Medications  sodium chloride 0.9 % bolus 1,000 mL (0 mLs Intravenous Stopped 08/10/17 1616)  ondansetron (ZOFRAN) injection 4 mg (4 mg Intravenous Given 08/10/17 1527)     Initial Impression / Assessment and Plan / ED Course  I have reviewed the triage vital signs and the nursing notes.  Pertinent labs that were available during my care of the patient were reviewed by me and considered in my medical decision making (see chart for details).     PT w/ 1 day of diarrhea, a few episodes of dry heaving this morning. She was comfortable on exam with reassuring vital signs, BP 147/76. She had no abdominal tenderness and denied any abdominal pain. Her labs show sodium 134, chloride 95, glucose 156, BUN 24, creatinine 0.99. WBC 11.5. Gave an IV fluid bolus and Zofran after which by mouth challenged.  UA is c/w infection and given symptomatic, will treat for UTI. I reviewed culture data and no evidence of resistant bacteria. She has allergies to keflex and amox, therefore gave cipro. Also provided w/ zofran and instructed to start taking probiotics. Discussed supportive measures including good hydration and to follow-up with PCP if diarrhea is persistent at one week. Return precautions extensively reviewed including fever, abdominal pain, dehydration, or any worsening symptoms. Patient and family voiced understanding and she was discharged in satisfactory condition.  Final Clinical Impressions(s) / ED Diagnoses   Final diagnoses:  Diarrhea of presumed infectious origin  Nausea  Acute cystitis with hematuria    New Prescriptions New Prescriptions   CIPROFLOXACIN (CIPRO) 500 MG TABLET    Take 1 tablet (500 mg total) by mouth 2 (two) times daily.   ONDANSETRON (ZOFRAN ODT) 4 MG  DISINTEGRATING TABLET    Take 1 tablet (4 mg  total) by mouth every 8 (eight) hours as needed for nausea or vomiting.     Little, Wenda Overland, MD 08/10/17 [phone]

## 2017-08-10 NOTE — ED Triage Notes (Signed)
Patient reports that she has had Diarrhea x 24 hours. Daughter brings her in because she feels like she is dehydrated and per daughter  " something's just not right"

## 2017-08-10 NOTE — Discharge Instructions (Signed)
START TAKING LACTOBACILLUS/PROBIOTICS TO HELP WITH DIARRHEA. DRINK PLENTY OF FLUIDS. TAKE ANTIBIOTICS AS DIRECTED FOR URINARY TRACT INFECTION. FOLLOW UP WITH PRIMARY CARE PROVIDER IF DIARRHEA IS STILL PRESENT AFTER 1 WEEK OF SYMPTOMS. RETURN TO ER FOR ANY BLEEDING, FEVER, ABDOMINAL PAIN, DEHYDRATION, OR WORSENING SYMPTOMS.

## 2017-08-11 ENCOUNTER — Encounter: Payer: Self-pay | Admitting: Family Medicine

## 2017-08-11 ENCOUNTER — Ambulatory Visit (INDEPENDENT_AMBULATORY_CARE_PROVIDER_SITE_OTHER): Payer: MEDICARE | Admitting: Family Medicine

## 2017-08-11 VITALS — BP 124/62 | HR 93 | Temp 97.9°F | Ht 60.5 in | Wt 122.5 lb

## 2017-08-11 DIAGNOSIS — R634 Abnormal weight loss: Secondary | ICD-10-CM | POA: Diagnosis not present

## 2017-08-11 DIAGNOSIS — N8110 Cystocele, unspecified: Secondary | ICD-10-CM | POA: Diagnosis not present

## 2017-08-11 DIAGNOSIS — I10 Essential (primary) hypertension: Secondary | ICD-10-CM | POA: Diagnosis not present

## 2017-08-11 DIAGNOSIS — N390 Urinary tract infection, site not specified: Secondary | ICD-10-CM

## 2017-08-11 DIAGNOSIS — Z23 Encounter for immunization: Secondary | ICD-10-CM

## 2017-08-11 NOTE — Patient Instructions (Addendum)
Encourage yogurt for probiotic benefit as wall as protein and calcium   Please aim for 64 oz of fluids daily to help kidney health- especially in light of uti and antibiotic   We will make a plan re: how long to be on cipro when final culture report comes back   Alert me if you do not continue to improve   Flu shot today

## 2017-08-11 NOTE — Progress Notes (Signed)
Subjective:    Patient ID: Michelle Gregory, female    DOB: June 16, 1923, 83 y.o.   MRN: [id number]  HPI Here for hospital f/u  Went into ED on 10/9 with n/v/d  Also dysuria  And MS change - family was worried about a stroke   Had pos ua   Results for orders placed or performed during the hospital encounter of 08/10/17  Urine culture  Result Value Ref Range   Specimen Description URINE, CLEAN CATCH    Special Requests Normal    Culture >=100,000 COLONIES/mL GRAM NEGATIVE RODS (A)    Report Status PENDING   Lipase, blood  Result Value Ref Range   Lipase 24 11 - 51 U/L  Comprehensive metabolic panel  Result Value Ref Range   Sodium 134 (L) 135 - 145 mmol/L   Potassium 3.7 3.5 - 5.1 mmol/L   Chloride 95 (L) 101 - 111 mmol/L   CO2 26 22 - 32 mmol/L   Glucose, Bld 156 (H) 65 - 99 mg/dL   BUN 24 (H) 6 - 20 mg/dL   Creatinine, Ser 0.99 0.44 - 1.00 mg/dL   Calcium 9.3 8.9 - 10.3 mg/dL   Total Protein 6.8 6.5 - 8.1 g/dL   Albumin 3.8 3.5 - 5.0 g/dL   AST 35 15 - 41 U/L   ALT 14 14 - 54 U/L   Alkaline Phosphatase 61 38 - 126 U/L   Total Bilirubin 1.2 0.3 - 1.2 mg/dL   GFR calc non Af Amer 48 (L) >60 mL/min   GFR calc Af Amer 56 (L) >60 mL/min   Anion gap 13 5 - 15  CBC  Result Value Ref Range   WBC 11.5 (H) 4.0 - 10.5 K/uL   RBC 4.11 3.87 - 5.11 MIL/uL   Hemoglobin 13.5 12.0 - 15.0 g/dL   HCT 39.5 36.0 - 46.0 %   MCV 96.1 78.0 - 100.0 fL   MCH 32.8 26.0 - 34.0 pg   MCHC 34.2 30.0 - 36.0 g/dL   RDW 12.0 11.5 - 15.5 %   Platelets  150 - 400 K/uL    PLATELET CLUMPS NOTED ON SMEAR, COUNT APPEARS ADEQUATE  Urinalysis, Routine w reflex microscopic  Result Value Ref Range   Color, Urine YELLOW YELLOW   APPearance CLOUDY (A) CLEAR   Specific Gravity, Urine 1.010 1.005 - 1.030   pH 7.0 5.0 - 8.0   Glucose, UA NEGATIVE NEGATIVE mg/dL   Hgb urine dipstick MODERATE (A) NEGATIVE   Bilirubin Urine NEGATIVE NEGATIVE   Ketones, ur 15 (A) NEGATIVE mg/dL   Protein, ur 100 (A) NEGATIVE  mg/dL   Nitrite NEGATIVE NEGATIVE   Leukocytes, UA LARGE (A) NEGATIVE  Urinalysis, Microscopic (reflex)  Result Value Ref Range   RBC / HPF 6-30 0 - 5 RBC/hpf   WBC, UA TOO NUMEROUS TO COUNT 0 - 5 WBC/hpf   Bacteria, UA MANY (A) NONE SEEN   Squamous Epithelial / LPF 0-5 (A) NONE SEEN    Pending sens for urine cx   She was given IVV and zofran  Px abx (cipro)-has had 2 doses   Wt Readings from Last 3 Encounters:  08/11/17 122 lb 8 oz (55.6 kg)  08/10/17 127 lb (57.6 kg)  03/03/17 127 lb 4 oz (57.7 kg)  lost some more weight   bp is stable today  No cp or palpitations or headaches or edema  No side effects to medicines  BP Readings from Last 3 Encounters:  08/11/17 124/62  08/10/17 134/73  03/03/17 130/76     Feeling much better  Wants a flu shot today   Did not have typical urinary symptoms   Patient Active Problem List   Diagnosis Date Noted  . Complicated UTI (urinary tract infection) 08/12/2017  . Auditory hallucinations 03/03/2017  . Weight loss 03/03/2017  . Female cystocele 04/13/2016  . Urinary urgency 02/17/2016  . Bowel habit changes 12/30/2015  . Compression fracture 12/30/2015  . Excessive cerumen in right ear canal 05/27/2015  . Ingrown toenail without infection 05/04/2015  . Routine general medical examination at a health care facility 04/09/2015  . Candidal intertrigo 03/28/2014  . Excessive cerumen in both ear canals 01/17/2014  . Encounter for Medicare annual wellness exam 03/21/2013  . Gout 09/19/2012  . Osteopenia 05/05/2011  . Degenerative lumbar disc 01/20/2011  . VARICOSE VEINS, LOWER EXTREMITIES 09/04/2010  . Hyperglycemia 01/08/2010  . Hyperlipidemia 05/09/2008  . GOUT 05/09/2008  . Anxiety state 05/09/2008  . Essential hypertension 05/09/2008  . HEMORRHOIDS, INTERNAL 05/09/2008  . Allergic rhinitis 05/09/2008  . Mild reactive airways disease 05/09/2008  . GERD 05/09/2008  . IBS 05/09/2008  . OSTEOARTHRITIS 05/09/2008  . History of  malignant neoplasm of large intestine 05/09/2008   Past Medical History:  Diagnosis Date  . Allergic rhinitis   . Anxiety   . Cancer ([place])   . Depression   . GERD (gastroesophageal reflux disease)   . History of colon cancer   . Hyperlipidemia   . Hypertension   . Osteoarthritis   . Spinal compression fracture ([place])   . Vertigo    Past Surgical History:  Procedure Laterality Date  . APPENDECTOMY    . CATARACT EXTRACTION    . COLON RESECTION    . TONSILLECTOMY     Social History  Substance Use Topics  . Smoking status: Never Smoker  . Smokeless tobacco: Never Used  . Alcohol use No   Family History  Problem Relation Age of Onset  . Prostate cancer Father   . Heart attack Mother   . Gout Mother    Allergies  Allergen Reactions  . Amoxicillin-Pot Clavulanate     REACTION: diarrhea  . Keflex [Cephalexin] Diarrhea  . Septra [Sulfamethoxazole-Trimethoprim] Nausea Only   Current Outpatient Prescriptions on File Prior to Visit  Medication Sig Dispense Refill  . allopurinol (ZYLOPRIM) 100 MG tablet TAKE 2 TABLETS BY MOUTH  DAILY 180 tablet 3  . ALPRAZolam (XANAX) 0.5 MG tablet 1/2 pill by mouth up to twice daily as needed for anxiety 30 tablet 3  . Ascorbic Acid (VITAMIN C) 100 MG tablet Take 100 mg by mouth daily.    Marland Kitchen aspirin 81 MG tablet Take 81 mg by mouth daily.      Marland Kitchen BIOTIN PO Take 1 tablet by mouth daily.    . calcium-vitamin D (CALCIUM 500 +D) 500 MG tablet Take 1 tablet by mouth daily.     . ciprofloxacin (CIPRO) 500 MG tablet Take 1 tablet (500 mg total) by mouth 2 (two) times daily. 14 tablet 0  . diphenoxylate-atropine (LOMOTIL) 2.5-0.025 MG tablet Take 1 tablet by mouth 4 (four) times daily as needed for diarrhea or loose stools. 30 tablet 0  . Docusate Calcium (STOOL SOFTENER PO) Take 1 tablet by mouth daily.    Marland Kitchen glucosamine-chondroitin 500-400 MG tablet Take 1 tablet by mouth daily.    Marland Kitchen HYDROcodone-acetaminophen (VICODIN) 5-500 MG per tablet Take 1  tablet by mouth See admin instructions. Take 5 tablets by mouth  daily per daughter    . losartan (COZAAR) 100 MG tablet TAKE 1 TABLET BY MOUTH  DAILY 90 tablet 3  . magnesium citrate SOLN Take 148 mLs (0.5 Bottles total) by mouth once. Repeat dose in 6 hours if no bowel movement. 195 mL 1  . meclizine (ANTIVERT) 25 MG tablet TAKE 1 TABLET (25 MG TOTAL) BY MOUTH 3 (THREE) TIMES  DAILY AS NEEDED. 270 tablet 0  . metoprolol tartrate (LOPRESSOR) 25 MG tablet TAKE 1 TABLET BY MOUTH  TWICE A DAY 180 tablet 3  . multivitamin (THERAGRAN) per tablet Take 1 tablet by mouth daily.      . nitrofurantoin, macrocrystal-monohydrate, (MACROBID) 100 MG capsule Take 1 capsule (100 mg total) by mouth 2 (two) times daily. 10 capsule 0  . ondansetron (ZOFRAN ODT) 4 MG disintegrating tablet Take 1 tablet (4 mg total) by mouth every 8 (eight) hours as needed for nausea or vomiting. 6 tablet 0  . PARoxetine (PAXIL) 30 MG tablet TAKE 1 TABLET BY MOUTH  EVERY MORNING 90 tablet 3  . pregabalin (LYRICA) 50 MG capsule Take 50 mg by mouth 2 (two) times daily.     . raloxifene (EVISTA) 60 MG tablet TAKE 1 TABLET BY MOUTH DAILY. 90 tablet 3   No current facility-administered medications on file prior to visit.      Review of Systems  Constitutional: Positive for appetite change. Negative for activity change, fatigue, fever and unexpected weight change.  HENT: Negative for congestion, ear pain, rhinorrhea, sinus pressure and sore throat.   Eyes: Negative for pain, redness and visual disturbance.  Respiratory: Negative for cough, shortness of breath and wheezing.   Cardiovascular: Negative for chest pain and palpitations.  Gastrointestinal: Negative for abdominal pain, blood in stool, constipation and diarrhea.  Endocrine: Negative for polydipsia and polyuria.  Genitourinary: Positive for frequency and urgency. Negative for dysuria.       Pos for urinary incontinence  Musculoskeletal: Positive for back pain. Negative for  arthralgias and myalgias.  Skin: Negative for pallor and rash.  Allergic/Immunologic: Negative for environmental allergies.  Neurological: Negative for dizziness, syncope and headaches.  Hematological: Negative for adenopathy. Does not bruise/bleed easily.  Psychiatric/Behavioral: Negative for decreased concentration and dysphoric mood. The patient is not nervous/anxious.        Pos for MS change that is now resolved        Objective:   Physical Exam  Constitutional: She appears well-developed and well-nourished. No distress.  Frail appearing elderly female   HENT:  Head: Normocephalic and atraumatic.  Eyes: Pupils are equal, round, and reactive to light. Conjunctivae and EOM are normal.  Neck: Normal range of motion. Neck supple.  Cardiovascular: Normal rate, regular rhythm and normal heart sounds.   Pulmonary/Chest: Effort normal and breath sounds normal.  Abdominal: Soft. Bowel sounds are normal. She exhibits no distension. There is tenderness. There is no rebound.  No cva tenderness  Mild suprapubic tenderness  Musculoskeletal: She exhibits no edema.  Lymphadenopathy:    She has no cervical adenopathy.  Neurological: She is alert. No cranial nerve deficit. She exhibits normal muscle tone. Coordination normal.  Skin: No rash noted.  Psychiatric: She has a normal mood and affect.  In good (better) spirits today  Mentally sharp          Assessment & Plan:   Problem List Items Addressed This Visit      Cardiovascular and Mediastinum   Essential hypertension - Primary     Genitourinary  Complicated UTI (urinary tract infection)    Recent ED visit with primary c/o of MS change  Reviewed hospital records, lab results and studies in detail  Pending sens of urine cx - but already clinically improving after 2 doses of cipro  Disc need for more water and better nutrition  Update if no further improvement       Female cystocele    With incontinence  Has seen urology  and pessary was recommended- I do think that would be helpful  She is apprehensive to drink fluids/getting utis due to incontinence currently        Other   Weight loss    From poor po intake/ dec appetite and recent illness Disc plan for meals with protein tid (meal supplements ok)  Yogurt is a good choice with current abx as well        Other Visit Diagnoses    Need for influenza vaccination       Relevant Orders   Flu Vaccine QUAD 6+ mos PF IM (Fluarix Quad PF) (Completed)

## 2017-08-12 DIAGNOSIS — N390 Urinary tract infection, site not specified: Secondary | ICD-10-CM | POA: Insufficient documentation

## 2017-08-12 LAB — URINE CULTURE
Culture: >=100000 — AB
Special Requests: NORMAL

## 2017-08-12 NOTE — Assessment & Plan Note (Signed)
From poor po intake/ dec appetite and recent illness Disc plan for meals with protein tid (meal supplements ok)  Yogurt is a good choice with current abx as well

## 2017-08-12 NOTE — Assessment & Plan Note (Signed)
With incontinence  Has seen urology and pessary was recommended- I do think that would be helpful  She is apprehensive to drink fluids/getting utis due to incontinence currently

## 2017-08-12 NOTE — Assessment & Plan Note (Signed)
Recent ED visit with primary c/o of MS change  Reviewed hospital records, lab results and studies in detail  Pending sens of urine cx - but already clinically improving after 2 doses of cipro  Disc need for more water and better nutrition  Update if no further improvement

## 2017-08-13 ENCOUNTER — Telehealth: Payer: Self-pay

## 2017-08-13 NOTE — Telephone Encounter (Signed)
Post ED Visit - Positive Culture Follow-up  Culture report reviewed by antimicrobial stewardship pharmacist:  []  Elenor Quinones, Pharm.D. [x]  Heide Guile, Pharm.D., BCPS AQ-ID []  Parks Neptune, Pharm.D., BCPS []  Alycia Rossetti, Pharm.D., BCPS []  [place], Pharm.D., BCPS, AAHIVP []  Legrand Como, Pharm.D., BCPS, AAHIVP []  Salome Arnt, PharmD, BCPS []  Dimitri Ped, PharmD, BCPS []  Vincenza Hews, PharmD, BCPS  Positive urine culture Treated with Ciprofloxacin, organism sensitive to the same and no further patient follow-up is required at this time.  Genia Del 08/13/2017, 9:44 AM

## 2017-08-17 ENCOUNTER — Telehealth: Payer: Self-pay

## 2017-08-17 NOTE — Telephone Encounter (Signed)
Yes-that sounds good Please make sure she is drinking enough water

## 2017-08-17 NOTE — Telephone Encounter (Signed)
Daughter [name] called to ask if she should bring in a urine specimen or have mom come back in, which is difficult?    Overall, patient is feeling much better but is still having some burning.  Patient took her last pill this morning.  Should they wait a couple days and then bring in a specimen for recheck?    Please advise, thanks.

## 2017-08-18 NOTE — Telephone Encounter (Signed)
We will send the urine for culture on Friday so if there is still bacteria I can see exactly what it is sensitive to (so we can choose the correct one) -- hope we will get a result by monday Hopefully if she needs one there will be an option different from what she was prev treated with

## 2017-08-18 NOTE — Telephone Encounter (Signed)
Patient's daughter [name]) notified as instructed by telephone and verbalized understanding.

## 2017-08-18 NOTE — Telephone Encounter (Signed)
Notified daughter [name].  She will bring in a specimen Friday.  Daughter has requested that if we need to place her on another round of abx, can we please try something different from what she was on this last time?  [name] feels like her mother's behaviors were significant while taking the medication from the hospital.   She was very agitated, angry, argumentative and "difficult".  Daughter felt like this was more the antibiotic than the infection process.    This is just an FYI when reviewing result on Friday.  Thanks.

## 2017-08-20 ENCOUNTER — Other Ambulatory Visit (INDEPENDENT_AMBULATORY_CARE_PROVIDER_SITE_OTHER): Payer: MEDICARE

## 2017-08-20 DIAGNOSIS — R8290 Unspecified abnormal findings in urine: Secondary | ICD-10-CM | POA: Diagnosis not present

## 2017-08-20 LAB — POCT URINALYSIS DIPSTICK
Bilirubin, UA: NEGATIVE
Blood, UA: NEGATIVE
Glucose, UA: NEGATIVE
Ketones, UA: NEGATIVE
Nitrite, UA: NEGATIVE
Protein, UA: NEGATIVE
Spec Grav, UA: 1.01 (ref 1.010–1.025)
Urobilinogen, UA: NEGATIVE E.U./dL — AB
pH, UA: 6 (ref 5.0–8.0)

## 2017-08-22 LAB — URINE CULTURE
MICRO NUMBER:: 81171564
SPECIMEN QUALITY:: ADEQUATE

## 2017-08-24 ENCOUNTER — Ambulatory Visit: Payer: Self-pay

## 2017-08-24 MED ORDER — CIPROFLOXACIN HCL 250 MG PO TABS: 250.0000 mg | ORAL_TABLET | Freq: Two times a day (BID) | ORAL | 0 refills | Status: DC

## 2017-08-24 NOTE — Telephone Encounter (Signed)
Daughter notified of Dr. Marliss Coots comments and Rx sent to pharmacy

## 2017-08-24 NOTE — Telephone Encounter (Signed)
I sent cipro to [facility] (since she cannot take keflex or sulfa)  Looks like that is what she took earlier in the mo after ED visit  Her urine cx did NOT show definitive infection (had some contamination) so if symptoms do not clear please bring her in for evaluation   Keep me posted

## 2017-08-24 NOTE — Telephone Encounter (Signed)
This is skype message from Corky Sox RN.[08/24/2017 9:30 AM] Corky Sox:  Daughter for Michelle Gregory called. Stated her Mothers' confusion has increased and when her Mother voids she says it hurts a little. Daughter wondered if more antibiotics could be called in. Daughter stated she brought a urine sample by the office Friday.

## 2017-08-24 NOTE — Telephone Encounter (Signed)
Sent Skype to [place] at [facility] about pt's condition  Reason for Disposition . Age > 50 years  Answer Assessment - Initial Assessment Questions 1. SEVERITY: "How bad is the pain?"  (e.g., Scale 1-10; mild, moderate, or severe)   - MILD (1-3): complains slightly about urination hurting   - MODERATE (4-7): interferes with normal activities     - SEVERE (8-10): excruciating, unwilling or unable to urinate because of the pain      Per her daughter her Mom says "it hurts a little " whe she voids 2. FREQUENCY: "How many times have you had painful urination today?"      Unknown -pt is confused 3. PATTERN: "Is pain present every time you urinate or just sometimes?"      Every time 4. ONSET: "When did the painful urination start?"      continued 5. FEVER: "Do you have a fever?" If so, ask: "What is your temperature, how was it measured, and when did it start?"     no 6. PAST UTI: "Have you had a urine infection before?" If so, ask: "When was the last time?" and "What happened that time?"      Yes- Finished round of antibiotic but Mother's confusion has increased. Daughter brought sample to office on Friday. 7. CAUSE: "What do you think is causing the painful urination?"  (e.g., UTI, scratch, Herpes sore)     UTI 8. OTHER SYMPTOMS: "Do you have any other symptoms?" (e.g., flank pain, vaginal discharge, genital sores, urgency, blood in urine)     unknown 9. PREGNANCY: "Is there any chance you are pregnant?" "When was your last menstrual period?"     N/A  Protocols used: [place]

## 2017-09-06 ENCOUNTER — Ambulatory Visit: Payer: Self-pay | Admitting: *Deleted

## 2017-09-06 ENCOUNTER — Ambulatory Visit (INDEPENDENT_AMBULATORY_CARE_PROVIDER_SITE_OTHER): Payer: MEDICARE | Admitting: Family Medicine

## 2017-09-06 ENCOUNTER — Encounter: Payer: Self-pay | Admitting: Family Medicine

## 2017-09-06 VITALS — BP 122/72 | HR 85 | Temp 97.9°F | Ht 60.5 in | Wt 124.0 lb

## 2017-09-06 DIAGNOSIS — R32 Unspecified urinary incontinence: Secondary | ICD-10-CM

## 2017-09-06 DIAGNOSIS — R4182 Altered mental status, unspecified: Secondary | ICD-10-CM

## 2017-09-06 DIAGNOSIS — O2340 Unspecified infection of urinary tract in pregnancy, unspecified trimester: Secondary | ICD-10-CM | POA: Insufficient documentation

## 2017-09-06 DIAGNOSIS — N390 Urinary tract infection, site not specified: Secondary | ICD-10-CM | POA: Insufficient documentation

## 2017-09-06 LAB — POC URINALSYSI DIPSTICK (AUTOMATED)
Bilirubin, UA: NEGATIVE
Glucose, UA: NEGATIVE
Ketones, UA: NEGATIVE
Nitrite, UA: NEGATIVE
Protein, UA: 25
Spec Grav, UA: 1.01 (ref 1.010–1.025)
Urobilinogen, UA: 0.2 E.U./dL
pH, UA: 6 (ref 5.0–8.0)

## 2017-09-06 MED ORDER — CIPROFLOXACIN HCL 250 MG PO TABS: 250.0000 mg | ORAL_TABLET | Freq: Two times a day (BID) | ORAL | 0 refills | Status: DC

## 2017-09-06 NOTE — Telephone Encounter (Addendum)
Per Daughter,Pt having confusion, hallucinations and urinating on self. This is not her norm. Pt has been treated twice for UTI. Per pt's daughter, [name], She was last seen at a walkup ED approximately 3 weeks ago and was given antibiotics.  [name] is not sure of the name of the antibiotic. The pt's last dose was on approximately 1 week ago. Pt's daughter is requesting appointment with Dr Glori Bickers today.  Reason for Disposition . [1] Can't control passage of urine (i.e., urinary incontinence) AND [2] new onset (< 2 weeks) or worsening  Answer Assessment - Initial Assessment Questions 1. SYMPTOM: "What's the main symptom you're concerned about?" (e.g., frequency, incontinence)     Recurrent uti; 2nd round of antibiotics  2. ONSET: "When did the  ________  start?"     Friday Nov 2 3. PAIN: "Is there any pain?" If so, ask: "How bad is it?" (Scale: 1-10; mild, moderate, severe)    Pt does not complain of pain 4. CAUSE: "What do you think is causing the symptoms?"     Daughter thinks UTI has reoccured 5. OTHER SYMPTOMS: "Do you have any other symptoms?" (e.g., fever, flank pain, blood in urine, pain with urination)     none 6. PREGNANCY: "Is there any chance you are pregnant?" "When was your last menstrual period?"     no  Protocols used: URINARY [facility]

## 2017-09-06 NOTE — Assessment & Plan Note (Signed)
Likely anatomical cause-pt for gyn appt later this mo to get a pessary and I hope it helps her empty more completely  Enc inc fluids Urine cx sent  cipro 250 bid 7 d px  Watch closely  Consider prophylaxis if needed while waiting for procedure (she is intol of keflex and sulfa however)

## 2017-09-06 NOTE — Progress Notes (Signed)
Subjective:    Patient ID: Michelle Gregory, female    DOB: 06-23-1923, 83 y.o.   MRN: [id number]  HPI  Here for incontinence with MS change = suspect uti  Has been acting funny/ has random hallucinations  May be sundowning   No pain to urinate  No blood in urine  Has urgency and frequency - cannot even get out of the house anymore  Leaks a fair amount  Changing pads very frequently - lot of accidents (more than usual)    Wt Readings from Last 3 Encounters:  09/06/17 124 lb (56.2 kg)  08/11/17 122 lb 8 oz (55.6 kg)  08/10/17 127 lb (57.6 kg)   23.82 kg/m   Ua: Results for orders placed or performed in visit on 09/06/17  POCT Urinalysis Dipstick (Automated)  Result Value Ref Range   Color, UA Yellow    Clarity, UA Cloudy    Glucose, UA Negative    Bilirubin, UA Negative    Ketones, UA Negative    Spec Grav, UA 1.010 1.010 - 1.025   Blood, UA Moderate    pH, UA 6.0 5.0 - 8.0   Protein, UA 25 mg/dL    Urobilinogen, UA 0.2 0.2 or 1.0 E.U./dL   Nitrite, UA Negative    Leukocytes, UA Large (3+) (A) Negative    Brought from home-will try to give another sample   Last ucx (f/u to uti 10/19) was neg so last e coli infection did clear (it was pan sensitive)  Has an appt with urology nov 26 with gyn to get a pessary- hoping that will help   Patient Active Problem List   Diagnosis Date Noted  . Maternal UTI (urinary tract infection), recurrent 09/06/2017  . Recurrent UTI (urinary tract infection) 09/06/2017  . Complicated UTI (urinary tract infection) 08/12/2017  . Auditory hallucinations 03/03/2017  . Weight loss 03/03/2017  . Female cystocele 04/13/2016  . Urinary urgency 02/17/2016  . Bowel habit changes 12/30/2015  . Compression fracture 12/30/2015  . Excessive cerumen in right ear canal 05/27/2015  . Ingrown toenail without infection 05/04/2015  . Routine general medical examination at a health care facility 04/09/2015  . Candidal intertrigo 03/28/2014  .  Excessive cerumen in both ear canals 01/17/2014  . Encounter for Medicare annual wellness exam 03/21/2013  . Gout 09/19/2012  . Osteopenia 05/05/2011  . Degenerative lumbar disc 01/20/2011  . VARICOSE VEINS, LOWER EXTREMITIES 09/04/2010  . Hyperglycemia 01/08/2010  . Hyperlipidemia 05/09/2008  . GOUT 05/09/2008  . Anxiety state 05/09/2008  . Essential hypertension 05/09/2008  . HEMORRHOIDS, INTERNAL 05/09/2008  . Allergic rhinitis 05/09/2008  . Mild reactive airways disease 05/09/2008  . GERD 05/09/2008  . IBS 05/09/2008  . OSTEOARTHRITIS 05/09/2008  . History of malignant neoplasm of large intestine 05/09/2008   Past Medical History:  Diagnosis Date  . Allergic rhinitis   . Anxiety   . Cancer ([place])   . Depression   . GERD (gastroesophageal reflux disease)   . History of colon cancer   . Hyperlipidemia   . Hypertension   . Osteoarthritis   . Spinal compression fracture ([place])   . Vertigo    Past Surgical History:  Procedure Laterality Date  . APPENDECTOMY    . CATARACT EXTRACTION    . COLON RESECTION    . TONSILLECTOMY     Social History   Tobacco Use  . Smoking status: Never Smoker  . Smokeless tobacco: Never Used  Substance Use Topics  . Alcohol use:  No    Alcohol/week: 0.0 oz  . Drug use: No   Family History  Problem Relation Age of Onset  . Prostate cancer Father   . Heart attack Mother   . Gout Mother    Allergies  Allergen Reactions  . Amoxicillin-Pot Clavulanate     REACTION: diarrhea  . Keflex [Cephalexin] Diarrhea  . Septra [Sulfamethoxazole-Trimethoprim] Nausea Only   Current Outpatient Medications on File Prior to Visit  Medication Sig Dispense Refill  . allopurinol (ZYLOPRIM) 100 MG tablet TAKE 2 TABLETS BY MOUTH  DAILY 180 tablet 3  . ALPRAZolam (XANAX) 0.5 MG tablet 1/2 pill by mouth up to twice daily as needed for anxiety 30 tablet 3  . Ascorbic Acid (VITAMIN C) 100 MG tablet Take 100 mg by mouth daily.    Marland Kitchen aspirin 81 MG tablet Take  81 mg by mouth daily.      Marland Kitchen BIOTIN PO Take 1 tablet by mouth daily.    . calcium-vitamin D (CALCIUM 500 +D) 500 MG tablet Take 1 tablet by mouth daily.     . diphenoxylate-atropine (LOMOTIL) 2.5-0.025 MG tablet Take 1 tablet by mouth 4 (four) times daily as needed for diarrhea or loose stools. 30 tablet 0  . Docusate Calcium (STOOL SOFTENER PO) Take 1 tablet by mouth daily.    Marland Kitchen glucosamine-chondroitin 500-400 MG tablet Take 1 tablet by mouth daily.    Marland Kitchen HYDROcodone-acetaminophen (VICODIN) 5-500 MG per tablet Take 1 tablet by mouth See admin instructions. Take 5 tablets by mouth daily per daughter    . losartan (COZAAR) 100 MG tablet TAKE 1 TABLET BY MOUTH  DAILY 90 tablet 3  . magnesium citrate SOLN Take 148 mLs (0.5 Bottles total) by mouth once. Repeat dose in 6 hours if no bowel movement. 195 mL 1  . meclizine (ANTIVERT) 25 MG tablet TAKE 1 TABLET (25 MG TOTAL) BY MOUTH 3 (THREE) TIMES  DAILY AS NEEDED. 270 tablet 0  . metoprolol tartrate (LOPRESSOR) 25 MG tablet TAKE 1 TABLET BY MOUTH  TWICE A DAY 180 tablet 3  . multivitamin (THERAGRAN) per tablet Take 1 tablet by mouth daily.      . nitrofurantoin, macrocrystal-monohydrate, (MACROBID) 100 MG capsule Take 1 capsule (100 mg total) by mouth 2 (two) times daily. 10 capsule 0  . ondansetron (ZOFRAN ODT) 4 MG disintegrating tablet Take 1 tablet (4 mg total) by mouth every 8 (eight) hours as needed for nausea or vomiting. 6 tablet 0  . PARoxetine (PAXIL) 30 MG tablet TAKE 1 TABLET BY MOUTH  EVERY MORNING 90 tablet 3  . pregabalin (LYRICA) 50 MG capsule Take 50 mg by mouth 2 (two) times daily.     . raloxifene (EVISTA) 60 MG tablet TAKE 1 TABLET BY MOUTH DAILY. 90 tablet 3   No current facility-administered medications on file prior to visit.     Review of Systems  Constitutional: Positive for activity change, appetite change and fatigue. Negative for fever and unexpected weight change.  HENT: Negative for congestion, ear pain, rhinorrhea, sinus  pressure and sore throat.   Eyes: Negative for pain, redness and visual disturbance.  Respiratory: Negative for cough, shortness of breath and wheezing.   Cardiovascular: Negative for chest pain and palpitations.  Gastrointestinal: Negative for abdominal pain, blood in stool, constipation, diarrhea and nausea.  Endocrine: Negative for polydipsia and polyuria.  Genitourinary: Positive for difficulty urinating, frequency and urgency. Negative for dysuria, flank pain, hematuria, pelvic pain, vaginal discharge and vaginal pain.  Musculoskeletal: Negative for  arthralgias, back pain and myalgias.  Skin: Negative for pallor and rash.  Allergic/Immunologic: Negative for environmental allergies.  Neurological: Negative for dizziness, syncope and headaches.  Hematological: Negative for adenopathy. Does not bruise/bleed easily.  Psychiatric/Behavioral: Positive for confusion and hallucinations. Negative for decreased concentration and dysphoric mood. The patient is not nervous/anxious.        Objective:   Physical Exam  Constitutional: She appears well-developed and well-nourished. No distress.  Frail appearing elderly female   HENT:  Head: Normocephalic and atraumatic.  HOH baseline  Eyes: Conjunctivae and EOM are normal. Pupils are equal, round, and reactive to light.  Neck: Normal range of motion. Neck supple.  Cardiovascular: Normal rate, regular rhythm and normal heart sounds.  Pulmonary/Chest: Effort normal and breath sounds normal.  Abdominal: Soft. Bowel sounds are normal. She exhibits no distension. There is no tenderness. There is no rebound.  No cva tenderness  No suprapubic tenderness (but palpation causes urge to urinate)    Musculoskeletal: She exhibits no edema.  Lymphadenopathy:    She has no cervical adenopathy.  Neurological: She is alert. No cranial nerve deficit. She exhibits normal muscle tone. Coordination normal.  Skin: No rash noted. No pallor.  Psychiatric: She has  a normal mood and affect.  Fairly mentally sharp at the time of visit (per family she is worse at night)          Assessment & Plan:   Problem List Items Addressed This Visit      Genitourinary   Recurrent UTI (urinary tract infection)    Likely anatomical cause-pt for gyn appt later this mo to get a pessary and I hope it helps her empty more completely  Enc inc fluids Urine cx sent  cipro 250 bid 7 d px  Watch closely  Consider prophylaxis if needed while waiting for procedure (she is intol of keflex and sulfa however)        Relevant Orders   Urine Culture    Other Visit Diagnoses    Urinary incontinence, unspecified type    -  Primary   Relevant Orders   POCT Urinalysis Dipstick (Automated) (Completed)   Altered mental status, unspecified altered mental status type       Relevant Orders   POCT Urinalysis Dipstick (Automated) (Completed)

## 2017-09-06 NOTE — Patient Instructions (Signed)
Give the cipro as directed Will wait for the urine culture   May consider prophylactic antibiotic if appropriate  Go ahead with the pessary - I think it may really help   Encourage fluids (mostly clear if possible)  Alert me if symptoms woren

## 2017-09-09 LAB — URINE CULTURE
MICRO NUMBER:: 81239928
SPECIMEN QUALITY:: ADEQUATE

## 2017-09-12 ENCOUNTER — Telehealth: Payer: Self-pay | Admitting: Family Medicine

## 2017-09-12 MED ORDER — NITROFURANTOIN MONOHYD MACRO 100 MG PO CAPS: ORAL_CAPSULE | ORAL | 0 refills | Status: DC

## 2017-09-12 NOTE — Telephone Encounter (Signed)
Urine culture showed staph - unsure if this is a contaminant or not  It is resistant to cipro so I want to treat with macrobid-please send or call it in  [facility] this helps Take it bid for 7 d and then once daily for prophylaxis for a month (she will be getting a pessary) Please alert me if symptoms do not improve  Encourage water   Please apologize for the delay as I was on surgical leave when this returned  Thanks

## 2017-09-13 MED ORDER — NITROFURANTOIN MONOHYD MACRO 100 MG PO CAPS: ORAL_CAPSULE | ORAL | 0 refills | Status: DC

## 2017-09-13 NOTE — Telephone Encounter (Signed)
Daughter [name] notified of urine cx results and Dr. Marliss Coots comments. Rx sent to pharmacy

## 2017-09-27 ENCOUNTER — Other Ambulatory Visit: Payer: Self-pay | Admitting: Obstetrics and Gynecology

## 2017-09-27 DIAGNOSIS — N812 Incomplete uterovaginal prolapse: Secondary | ICD-10-CM | POA: Diagnosis not present

## 2017-09-27 DIAGNOSIS — N814 Uterovaginal prolapse, unspecified: Secondary | ICD-10-CM | POA: Diagnosis not present

## 2017-10-02 DIAGNOSIS — A0472 Enterocolitis due to Clostridium difficile, not specified as recurrent: Secondary | ICD-10-CM

## 2017-10-02 HISTORY — DX: Enterocolitis due to Clostridium difficile, not specified as recurrent: A04.72

## 2017-10-13 ENCOUNTER — Other Ambulatory Visit: Payer: Self-pay

## 2017-10-13 ENCOUNTER — Inpatient Hospital Stay
Admission: EM | Admit: 2017-10-13 | Discharge: 2017-10-20 | DRG: 872 | Disposition: A | Discharge status: Skilled Nursing Facility | Payer: MEDICARE | Attending: Specialist | Admitting: Specialist

## 2017-10-13 ENCOUNTER — Emergency Department: Payer: MEDICARE

## 2017-10-13 ENCOUNTER — Ambulatory Visit (INDEPENDENT_AMBULATORY_CARE_PROVIDER_SITE_OTHER): Payer: MEDICARE | Admitting: Family Medicine

## 2017-10-13 ENCOUNTER — Encounter: Payer: Self-pay | Admitting: Family Medicine

## 2017-10-13 VITALS — BP 84/48 | HR 119 | Temp 97.8°F

## 2017-10-13 DIAGNOSIS — E876 Hypokalemia: Secondary | ICD-10-CM | POA: Diagnosis not present

## 2017-10-13 DIAGNOSIS — M1990 Unspecified osteoarthritis, unspecified site: Secondary | ICD-10-CM | POA: Diagnosis present

## 2017-10-13 DIAGNOSIS — G629 Polyneuropathy, unspecified: Secondary | ICD-10-CM | POA: Diagnosis present

## 2017-10-13 DIAGNOSIS — E871 Hypo-osmolality and hyponatremia: Secondary | ICD-10-CM | POA: Diagnosis present

## 2017-10-13 DIAGNOSIS — E785 Hyperlipidemia, unspecified: Secondary | ICD-10-CM | POA: Diagnosis present

## 2017-10-13 DIAGNOSIS — I9589 Other hypotension: Secondary | ICD-10-CM | POA: Diagnosis not present

## 2017-10-13 DIAGNOSIS — I10 Essential (primary) hypertension: Secondary | ICD-10-CM | POA: Diagnosis present

## 2017-10-13 DIAGNOSIS — A414 Sepsis due to anaerobes: Secondary | ICD-10-CM | POA: Diagnosis not present

## 2017-10-13 DIAGNOSIS — Z79899 Other long term (current) drug therapy: Secondary | ICD-10-CM

## 2017-10-13 DIAGNOSIS — I491 Atrial premature depolarization: Secondary | ICD-10-CM | POA: Diagnosis present

## 2017-10-13 DIAGNOSIS — K649 Unspecified hemorrhoids: Secondary | ICD-10-CM | POA: Diagnosis not present

## 2017-10-13 DIAGNOSIS — R079 Chest pain, unspecified: Secondary | ICD-10-CM | POA: Diagnosis not present

## 2017-10-13 DIAGNOSIS — F419 Anxiety disorder, unspecified: Secondary | ICD-10-CM | POA: Diagnosis present

## 2017-10-13 DIAGNOSIS — A0472 Enterocolitis due to Clostridium difficile, not specified as recurrent: Secondary | ICD-10-CM

## 2017-10-13 DIAGNOSIS — M109 Gout, unspecified: Secondary | ICD-10-CM | POA: Diagnosis present

## 2017-10-13 DIAGNOSIS — G8929 Other chronic pain: Secondary | ICD-10-CM | POA: Diagnosis present

## 2017-10-13 DIAGNOSIS — E860 Dehydration: Secondary | ICD-10-CM | POA: Diagnosis present

## 2017-10-13 DIAGNOSIS — K648 Other hemorrhoids: Secondary | ICD-10-CM | POA: Diagnosis present

## 2017-10-13 DIAGNOSIS — Z881 Allergy status to other antibiotic agents status: Secondary | ICD-10-CM | POA: Diagnosis not present

## 2017-10-13 DIAGNOSIS — R531 Weakness: Secondary | ICD-10-CM | POA: Diagnosis present

## 2017-10-13 DIAGNOSIS — N390 Urinary tract infection, site not specified: Secondary | ICD-10-CM | POA: Diagnosis present

## 2017-10-13 DIAGNOSIS — I471 Supraventricular tachycardia: Secondary | ICD-10-CM | POA: Diagnosis not present

## 2017-10-13 DIAGNOSIS — Z85038 Personal history of other malignant neoplasm of large intestine: Secondary | ICD-10-CM | POA: Diagnosis not present

## 2017-10-13 DIAGNOSIS — K219 Gastro-esophageal reflux disease without esophagitis: Secondary | ICD-10-CM | POA: Diagnosis present

## 2017-10-13 DIAGNOSIS — Z7982 Long term (current) use of aspirin: Secondary | ICD-10-CM

## 2017-10-13 DIAGNOSIS — Z8619 Personal history of other infectious and parasitic diseases: Secondary | ICD-10-CM | POA: Diagnosis present

## 2017-10-13 DIAGNOSIS — R197 Diarrhea, unspecified: Secondary | ICD-10-CM | POA: Diagnosis not present

## 2017-10-13 DIAGNOSIS — E861 Hypovolemia: Secondary | ICD-10-CM | POA: Diagnosis not present

## 2017-10-13 DIAGNOSIS — F329 Major depressive disorder, single episode, unspecified: Secondary | ICD-10-CM | POA: Diagnosis present

## 2017-10-13 DIAGNOSIS — R109 Unspecified abdominal pain: Secondary | ICD-10-CM | POA: Diagnosis not present

## 2017-10-13 DIAGNOSIS — M6281 Muscle weakness (generalized): Secondary | ICD-10-CM | POA: Diagnosis not present

## 2017-10-13 DIAGNOSIS — I959 Hypotension, unspecified: Secondary | ICD-10-CM | POA: Diagnosis present

## 2017-10-13 DIAGNOSIS — A419 Sepsis, unspecified organism: Secondary | ICD-10-CM | POA: Diagnosis not present

## 2017-10-13 DIAGNOSIS — R1312 Dysphagia, oropharyngeal phase: Secondary | ICD-10-CM | POA: Diagnosis not present

## 2017-10-13 DIAGNOSIS — R000 Tachycardia, unspecified: Secondary | ICD-10-CM | POA: Diagnosis not present

## 2017-10-13 LAB — CBC
HCT: 38.6 % (ref 35.0–47.0)
Hemoglobin: 12.8 g/dL (ref 12.0–16.0)
MCH: 33 pg (ref 26.0–34.0)
MCHC: 33.2 g/dL (ref 32.0–36.0)
MCV: 99.4 fL (ref 80.0–100.0)
Platelets: 173 10*3/uL (ref 150–440)
RBC: 3.88 MIL/uL (ref 3.80–5.20)
RDW: 13.7 % (ref 11.5–14.5)
WBC: 14.6 10*3/uL — ABNORMAL HIGH (ref 3.6–11.0)

## 2017-10-13 LAB — GASTROINTESTINAL PANEL BY PCR, STOOL (REPLACES STOOL CULTURE)

## 2017-10-13 LAB — COMPREHENSIVE METABOLIC PANEL
ALT: 18 U/L (ref 14–54)
AST: 46 U/L — ABNORMAL HIGH (ref 15–41)
Albumin: 3 g/dL — ABNORMAL LOW (ref 3.5–5.0)
Alkaline Phosphatase: 55 U/L (ref 38–126)
Anion gap: 7 (ref 5–15)
BUN: 29 mg/dL — ABNORMAL HIGH (ref 6–20)
CO2: 29 mmol/L (ref 22–32)
Calcium: 8.7 mg/dL — ABNORMAL LOW (ref 8.9–10.3)
Chloride: 98 mmol/L — ABNORMAL LOW (ref 101–111)
Creatinine, Ser: 0.89 mg/dL (ref 0.44–1.00)
GFR calc Af Amer: >60 mL/min (ref >60)
GFR calc non Af Amer: 55 mL/min — ABNORMAL LOW (ref >60)
Glucose, Bld: 105 mg/dL — ABNORMAL HIGH (ref 65–99)
Potassium: 3.8 mmol/L (ref 3.5–5.1)
Sodium: 134 mmol/L — ABNORMAL LOW (ref 135–145)
Total Bilirubin: 1.1 mg/dL (ref 0.3–1.2)
Total Protein: 5.9 g/dL — ABNORMAL LOW (ref 6.5–8.1)

## 2017-10-13 LAB — C DIFFICILE QUICK SCREEN W PCR REFLEX
C Diff antigen: POSITIVE — AB
C Diff interpretation: DETECTED
C Diff toxin: POSITIVE — AB

## 2017-10-13 LAB — APTT: aPTT: 28 seconds (ref 24–36)

## 2017-10-13 LAB — LACTIC ACID, PLASMA: Lactic Acid, Venous: 0.9 mmol/L (ref 0.5–1.9)

## 2017-10-13 LAB — PROTIME-INR
INR: 1.08
Prothrombin Time: 13.9 seconds (ref 11.4–15.2)

## 2017-10-13 LAB — PROCALCITONIN: Procalcitonin: 0.24 ng/mL

## 2017-10-13 LAB — MAGNESIUM: Magnesium: 1.3 mg/dL — ABNORMAL LOW (ref 1.7–2.4)

## 2017-10-13 MED ORDER — ALBUTEROL SULFATE (2.5 MG/3ML) 0.083% IN NEBU
2.5000 mg | INHALATION_SOLUTION | RESPIRATORY_TRACT | Status: DC | PRN
  Administered 2017-10-16: 06:00:00 2.5 mg via RESPIRATORY_TRACT
  Filled 2017-10-13: qty 3

## 2017-10-13 MED ORDER — ALLOPURINOL 100 MG PO TABS
200.0000 mg | ORAL_TABLET | Freq: Every day | ORAL | Status: DC
  Administered 2017-10-14 – 2017-10-20 (×7): 200 mg via ORAL
  Filled 2017-10-13 (×7): qty 2

## 2017-10-13 MED ORDER — IOPAMIDOL (ISOVUE-300) INJECTION 61%
30.0000 mL | Freq: Once | INTRAVENOUS | Status: AC | PRN
  Administered 2017-10-13: 30 mL via ORAL

## 2017-10-13 MED ORDER — ALPRAZOLAM 0.25 MG PO TABS
0.2500 mg | ORAL_TABLET | Freq: Two times a day (BID) | ORAL | Status: DC | PRN
  Administered 2017-10-13 – 2017-10-17 (×2): 0.25 mg via ORAL
  Filled 2017-10-13 (×3): qty 1

## 2017-10-13 MED ORDER — ACETAMINOPHEN 650 MG RE SUPP: 650.0000 mg | Freq: Four times a day (QID) | RECTAL | Status: DC | PRN

## 2017-10-13 MED ORDER — PAROXETINE HCL 30 MG PO TABS
30.0000 mg | ORAL_TABLET | Freq: Every morning | ORAL | Status: DC
  Administered 2017-10-14 – 2017-10-20 (×7): 30 mg via ORAL
  Filled 2017-10-13 (×7): qty 1

## 2017-10-13 MED ORDER — MECLIZINE HCL 25 MG PO TABS
25.0000 mg | ORAL_TABLET | Freq: Three times a day (TID) | ORAL | Status: DC | PRN
  Administered 2017-10-17: 25 mg via ORAL
  Filled 2017-10-13 (×2): qty 1

## 2017-10-13 MED ORDER — SODIUM CHLORIDE 0.9 % IV BOLUS (SEPSIS)
1000.0000 mL | Freq: Once | INTRAVENOUS | Status: AC
  Administered 2017-10-13: 1000 mL via INTRAVENOUS

## 2017-10-13 MED ORDER — VANCOMYCIN 50 MG/ML ORAL SOLUTION
125.0000 mg | Freq: Four times a day (QID) | ORAL | Status: DC
  Administered 2017-10-13 – 2017-10-20 (×26): 125 mg via ORAL
  Filled 2017-10-13 (×29): qty 2.5

## 2017-10-13 MED ORDER — SODIUM CHLORIDE 0.9 % IV SOLN
INTRAVENOUS | Status: DC
  Administered 2017-10-13 – 2017-10-15 (×4): via INTRAVENOUS

## 2017-10-13 MED ORDER — ACETAMINOPHEN 325 MG PO TABS
650.0000 mg | ORAL_TABLET | Freq: Four times a day (QID) | ORAL | Status: DC | PRN
  Administered 2017-10-14 – 2017-10-17 (×2): 650 mg via ORAL
  Filled 2017-10-13 (×2): qty 2

## 2017-10-13 MED ORDER — ENOXAPARIN SODIUM 40 MG/0.4ML ~~LOC~~ SOLN
40.0000 mg | SUBCUTANEOUS | Status: DC
  Administered 2017-10-13 – 2017-10-19 (×7): 40 mg via SUBCUTANEOUS
  Filled 2017-10-13 (×7): qty 0.4

## 2017-10-13 MED ORDER — RALOXIFENE HCL 60 MG PO TABS
60.0000 mg | ORAL_TABLET | Freq: Every day | ORAL | Status: DC
  Administered 2017-10-14 – 2017-10-20 (×7): 60 mg via ORAL
  Filled 2017-10-13 (×7): qty 1

## 2017-10-13 MED ORDER — PREGABALIN 50 MG PO CAPS: 50.0000 mg | ORAL_CAPSULE | Freq: Two times a day (BID) | ORAL | Status: DC

## 2017-10-13 MED ORDER — ONDANSETRON HCL 4 MG/2ML IJ SOLN
4.0000 mg | Freq: Four times a day (QID) | INTRAMUSCULAR | Status: DC | PRN
  Administered 2017-10-19: 4 mg via INTRAVENOUS
  Filled 2017-10-13: qty 2

## 2017-10-13 MED ORDER — ASPIRIN EC 81 MG PO TBEC
81.0000 mg | DELAYED_RELEASE_TABLET | Freq: Every day | ORAL | Status: DC
  Administered 2017-10-14 – 2017-10-20 (×7): 81 mg via ORAL
  Filled 2017-10-13 (×7): qty 1

## 2017-10-13 MED ORDER — VANCOMYCIN 50 MG/ML ORAL SOLUTION
125.0000 mg | Freq: Once | ORAL | Status: AC
  Administered 2017-10-13: 125 mg via ORAL
  Filled 2017-10-13: qty 2.5

## 2017-10-13 MED ORDER — ONDANSETRON HCL 4 MG PO TABS: 4.0000 mg | ORAL_TABLET | Freq: Four times a day (QID) | ORAL | Status: DC | PRN

## 2017-10-13 MED ORDER — GABAPENTIN 300 MG PO CAPS
300.0000 mg | ORAL_CAPSULE | Freq: Two times a day (BID) | ORAL | Status: DC
  Administered 2017-10-13 – 2017-10-20 (×14): 300 mg via ORAL
  Filled 2017-10-13 (×14): qty 1

## 2017-10-13 MED ORDER — IOPAMIDOL (ISOVUE-300) INJECTION 61%
75.0000 mL | Freq: Once | INTRAVENOUS | Status: AC | PRN
  Administered 2017-10-13: 75 mL via INTRAVENOUS

## 2017-10-13 NOTE — ED Notes (Signed)
Patient transported from CT 

## 2017-10-13 NOTE — ED Provider Notes (Addendum)
[facility] Emergency Department Provider Note ____________________________________________   I have reviewed the triage vital signs and the triage nursing note.  HISTORY  Chief Complaint Diarrhea and Abdominal Pain   Historian Patient, patient's daughter and son-in-law  HPI Michelle Gregory is a 83 y.o. female who is relatively healthy, but has been on preventative antibiotic for urinary tract infections, however has had diarrhea for about 4 days now.  It has been watery, multiple episodes per day.  Nonbloody.  No fevers.  Moderate abdominal cramping.  Denies urinary symptoms.  Patient went to primary care doctor's office and found to be hypotensive and tachycardic and was sent to the ED for further management and evaluation and treatment.   Past Medical History:  Diagnosis Date  . Allergic rhinitis   . Anxiety   . Cancer ([place])   . Depression   . GERD (gastroesophageal reflux disease)   . History of colon cancer   . Hyperlipidemia   . Hypertension   . Osteoarthritis   . Spinal compression fracture ([place])   . Vertigo     Patient Active Problem List   Diagnosis Date Noted  . Maternal UTI (urinary tract infection), recurrent 09/06/2017  . Recurrent UTI (urinary tract infection) 09/06/2017  . Complicated UTI (urinary tract infection) 08/12/2017  . Auditory hallucinations 03/03/2017  . Weight loss 03/03/2017  . Female cystocele 04/13/2016  . Urinary urgency 02/17/2016  . Bowel habit changes 12/30/2015  . Compression fracture 12/30/2015  . Excessive cerumen in right ear canal 05/27/2015  . Ingrown toenail without infection 05/04/2015  . Routine general medical examination at a health care facility 04/09/2015  . Candidal intertrigo 03/28/2014  . Excessive cerumen in both ear canals 01/17/2014  . Encounter for Medicare annual wellness exam 03/21/2013  . Gout 09/19/2012  . Osteopenia 05/05/2011  . Degenerative lumbar disc 01/20/2011  . VARICOSE  VEINS, LOWER EXTREMITIES 09/04/2010  . Hyperglycemia 01/08/2010  . Hyperlipidemia 05/09/2008  . GOUT 05/09/2008  . Anxiety state 05/09/2008  . Essential hypertension 05/09/2008  . HEMORRHOIDS, INTERNAL 05/09/2008  . Allergic rhinitis 05/09/2008  . Mild reactive airways disease 05/09/2008  . GERD 05/09/2008  . IBS 05/09/2008  . OSTEOARTHRITIS 05/09/2008  . History of malignant neoplasm of large intestine 05/09/2008    Past Surgical History:  Procedure Laterality Date  . APPENDECTOMY    . CATARACT EXTRACTION    . COLON RESECTION    . TONSILLECTOMY      Prior to Admission medications   Medication Sig Start Date End Date Taking? Authorizing Provider  allopurinol (ZYLOPRIM) 100 MG tablet TAKE 2 TABLETS BY MOUTH  DAILY 06/01/17  Yes Tower, [name], MD  Ascorbic Acid (VITAMIN C) 100 MG tablet Take 100 mg by mouth daily.   Yes [provider]  aspirin 81 MG tablet Take 81 mg by mouth daily.     Yes [provider]  calcium-vitamin D (CALCIUM 500 +D) 500 MG tablet Take 1 tablet by mouth daily.    Yes [provider]  diphenoxylate-atropine (LOMOTIL) 2.5-0.025 MG tablet Take 1 tablet by mouth 4 (four) times daily as needed for diarrhea or loose stools. 12/24/15  Yes Tanna Furry, MD  gabapentin (NEURONTIN) 300 MG capsule Take 300 mg by mouth 2 (two) times daily.  09/06/17  Yes [provider]  glucosamine-chondroitin 500-400 MG tablet Take 1 tablet by mouth daily.   Yes [provider]  HYDROcodone-acetaminophen (VICODIN) 5-500 MG per tablet Take 1 tablet by mouth See admin  instructions. Take 5 tablets by mouth daily per daughter   Yes [provider]  losartan (COZAAR) 100 MG tablet TAKE 1 TABLET BY MOUTH  DAILY 06/01/17  Yes Tower, [name], MD  metoprolol tartrate (LOPRESSOR) 25 MG tablet TAKE 1 TABLET BY MOUTH  TWICE A DAY 06/01/17  Yes Tower, [name], MD  multivitamin [facility]) per tablet Take 1 tablet by mouth daily.     Yes [provider]  PARoxetine (PAXIL) 30 MG tablet TAKE 1 TABLET BY MOUTH  EVERY MORNING 06/01/17  Yes Tower, Marne A, MD  pregabalin (LYRICA) 50 MG capsule Take 50 mg by mouth 2 (two) times daily.    Yes [provider]  raloxifene (EVISTA) 60 MG tablet TAKE 1 TABLET BY MOUTH DAILY. 06/01/17  Yes Tower, [name], MD  ALPRAZolam Duanne Moron) 0.5 MG tablet 1/2 pill by mouth up to twice daily as needed for anxiety 06/01/17   Tower, [name], MD  meclizine (ANTIVERT) 25 MG tablet TAKE 1 TABLET (25 MG TOTAL) BY MOUTH 3 (THREE) TIMES  DAILY AS NEEDED. 12/11/16   Tower, [name], MD    Allergies  Allergen Reactions  . Amoxicillin-Pot Clavulanate     REACTION: diarrhea  . Keflex [Cephalexin] Diarrhea  . Septra [Sulfamethoxazole-Trimethoprim] Nausea Only    Family History  Problem Relation Age of Onset  . Prostate cancer Father   . Heart attack Mother   . Gout Mother     Social History Social History   Tobacco Use  . Smoking status: Never Smoker  . Smokeless tobacco: Never Used  Substance Use Topics  . Alcohol use: No    Alcohol/week: 0.0 oz  . Drug use: No    Review of Systems  Constitutional: Negative for fever. Eyes: Negative for visual changes. ENT: Negative for sore throat. Cardiovascular: Negative for chest pain. Respiratory: Negative for shortness of breath. Gastrointestinal: Negative for any vomiting. Genitourinary: Negative for dysuria. Musculoskeletal: Negative for back pain. Skin: Negative for rash. Neurological: Negative for headache.  ____________________________________________   PHYSICAL EXAM:  VITAL SIGNS: ED Triage Vitals [10/13/17 1145]  Enc Vitals Group     BP (!) 92/55     Pulse Rate (!) 122     Resp 18     Temp 98 F (36.7 C)     Temp Source Oral     SpO2 97 %     Weight 125 lb (56.7 kg)     Height      Head Circumference      Peak Flow      Pain Score 0     Pain Loc      Pain Edu?      Excl. in [place]?      Constitutional: Alert and  operative. Well appearing and in no distress. HEENT   Head: Normocephalic and atraumatic.      Eyes: Conjunctivae are normal. Pupils equal and round.       Ears:         Nose: No congestion/rhinnorhea.   Mouth/Throat: Mucous membranes are moist.   Neck: No stridor. Cardiovascular/Chest: Tachycardic rate, regular rhythm.  No murmurs, rubs, or gallops. Respiratory: Normal respiratory effort without tachypnea nor retractions. Breath sounds are clear and equal bilaterally. No wheezes/rales/rhonchi. Gastrointestinal: Soft. No distention, no guarding, no rebound.  Mild nonfocal soreness. Genitourinary/rectal:Deferred Musculoskeletal: Nontender with normal range of motion in all extremities. No joint effusions.  No lower extremity tenderness.  No edema. Neurologic:  Normal speech and language. No  gross or focal neurologic deficits are appreciated. Skin:  Skin is warm, dry and intact. No rash noted. Psychiatric: Mood and affect are normal. Speech and behavior are normal. Patient exhibits appropriate insight and judgment.   ____________________________________________  LABS (pertinent positives/negatives) I, Lisa Roca, MD the attending physician have reviewed the labs noted below.  Labs Reviewed  C DIFFICILE QUICK SCREEN W PCR REFLEX - Abnormal; Notable for the following components:      Result Value   C Diff antigen POSITIVE (*)    C Diff toxin POSITIVE (*)    All other components within normal limits  CBC - Abnormal; Notable for the following components:   WBC 14.6 (*)    All other components within normal limits  COMPREHENSIVE METABOLIC PANEL - Abnormal; Notable for the following components:   Sodium 134 (*)    Chloride 98 (*)    Glucose, Bld 105 (*)    BUN 29 (*)    Calcium 8.7 (*)    Total Protein 5.9 (*)    Albumin 3.0 (*)    AST 46 (*)    GFR calc non Af Amer 55 (*)    All other components within normal limits  CULTURE, BLOOD (ROUTINE X 2)  CULTURE, BLOOD  (ROUTINE X 2)  GASTROINTESTINAL PANEL BY PCR, STOOL (REPLACES STOOL CULTURE)  LACTIC ACID, PLASMA  URINALYSIS, COMPLETE (UACMP) WITH MICROSCOPIC  CBG MONITORING, ED    ____________________________________________    EKG I, Lisa Roca, MD, the attending physician have personally viewed and interpreted all ECGs.  None ____________________________________________  RADIOLOGY All Xrays were viewed by me.  Imaging interpreted by Radiologist, and I, Lisa Roca, MD the attending physician have reviewed the radiologist interpretation noted below.  CT abdomen and pelvis with contrast:  IMPRESSION: Severe wall thickening inflammatory changes are seen involving the splenic flexure and descending colon concerning for infectious or inflammatory colitis.  Aortic atherosclerosis. __________________________________________  PROCEDURES  Procedure(s) performed: None  Critical Care performed: None   ____________________________________________  ED COURSE / ASSESSMENT AND PLAN  Pertinent labs & imaging results that were available during my care of the patient were reviewed by me and considered in my medical decision making (see chart for details).    Ms. [name] is very sweet and interactive, however she was found to be hypotensive and tachycardic with a history of volume losses from diarrhea for about 4 days now.  No fever reported fever.  I am most suspicious about dehydration as opposed to sepsis.  We will add on blood cultures however.  White blood cell count was found to be slightly elevated.  She is having some nonfocal but mild diffuse cramping, and I am going to CT the abdomen.  After 1 L normal saline bolus, patient's hypotension has resolved with blood pressure to 782 systolic and heart rate down to about 107.  I am going to give her second liter of fluid.  Awaiting stool sample and evaluation for C. difficile and GI biofire testing.   C diff positive.  Patient's blood  pressure has remained up, however she remains tachycardic especially when she got up to the bathroom her heart rate went up to the 140s.  Patient is going to need hospital treatment until vital signs are stable and reassuring, and she might need nursing home placement as family is unable to care for her in terms of the amount of diarrhea bowel movements that she is been having.  I went ahead and placed her under code sepsis for  monitoring given tachycardia and infection with elevated wbc -- covering for source of c diff with po vancomycin.  DIFFERENTIAL DIAGNOSIS: Including but not limited to diverticulitis, abscess, perforation, obstruction, gastrointestinal virus, C. difficile, sepsis, dehydration, acute renal failure, etc.  CONSULTATIONS:  Hospitalist for admission.   Patient / Family / Caregiver informed of clinical course, medical decision-making process, and agree with plan.     ___________________________________________   FINAL CLINICAL IMPRESSION(S) / ED DIAGNOSES   Final diagnoses:  Watery diarrhea  Dehydration  C. difficile colitis      ___________________________________________        Note: This dictation was prepared with Dragon dictation. Any transcriptional errors that result from this process are unintentional    Lisa Roca, MD 10/13/17 1550    Lisa Roca, MD 10/13/17 [phone]

## 2017-10-13 NOTE — H&P (Signed)
[place] [facility] [place] NAME: Michelle Gregory    MR#:  [id number]  DATE OF BIRTH:  [birth year]  DATE OF ADMISSION:  10/13/2017  PRIMARY CARE PHYSICIAN: Tower, [name], MD   REQUESTING/REFERRING PHYSICIAN: Lisa Roca, MD  CHIEF COMPLAINT:   Chief Complaint  Patient presents with  . Diarrhea  . Abdominal Pain   Abdominal cramping and diarrhea for 4 days. HISTORY OF PRESENT ILLNESS:  Michelle Gregory  is a 83 y.o. female with a known history of colon cancer, hypertension, osteoarthritis and vertigo.  The patient was sent to the ED due to above chief complaints.  The patient has been on preventive antibiotics for UTI but has developed diarrhea 4 days ago.  The diarrhea is watery and multiple times a day.  The patient denies any fever or chills but has generalized weakness.  She is found hypotensive at 84/48, tachycardia and leukocytosis.  C. difficile test is positive.  CAT scan of the abdomen showed colitis.  She is independent of her baseline.   PAST MEDICAL HISTORY:   Past Medical History:  Diagnosis Date  . Allergic rhinitis   . Anxiety   . Cancer ([place])   . Depression   . GERD (gastroesophageal reflux disease)   . History of colon cancer   . Hyperlipidemia   . Hypertension   . Osteoarthritis   . Spinal compression fracture ([place])   . Vertigo     PAST SURGICAL HISTORY:   Past Surgical History:  Procedure Laterality Date  . APPENDECTOMY    . CATARACT EXTRACTION    . COLON RESECTION    . TONSILLECTOMY      SOCIAL HISTORY:   Social History   Tobacco Use  . Smoking status: Never Smoker  . Smokeless tobacco: Never Used  Substance Use Topics  . Alcohol use: No    Alcohol/week: 0.0 oz    FAMILY HISTORY:   Family History  Problem Relation Age of Onset  . Prostate cancer Father   . Heart attack Mother   . Gout Mother     DRUG ALLERGIES:   Allergies  Allergen Reactions  . Amoxicillin-Pot Clavulanate     REACTION:  diarrhea  . Keflex [Cephalexin] Diarrhea  . Septra [Sulfamethoxazole-Trimethoprim] Nausea Only    REVIEW OF SYSTEMS:   Review of Systems  Constitutional: Positive for malaise/fatigue. Negative for chills and fever.  HENT: Negative for sore throat.   Eyes: Negative for blurred vision and double vision.  Respiratory: Negative for cough, hemoptysis, shortness of breath, wheezing and stridor.   Cardiovascular: Negative for chest pain, palpitations, orthopnea and leg swelling.  Gastrointestinal: Positive for diarrhea. Negative for abdominal pain, blood in stool, melena, nausea and vomiting.  Genitourinary: Negative for dysuria, flank pain and hematuria.  Musculoskeletal: Negative for back pain and joint pain.  Skin: Negative for rash.  Neurological: Positive for weakness. Negative for dizziness, sensory change, focal weakness, seizures, loss of consciousness and headaches.  Endo/Heme/Allergies: Negative for polydipsia.  Psychiatric/Behavioral: Negative for depression. The patient is not nervous/anxious.     MEDICATIONS AT HOME:   Prior to Admission medications   Medication Sig Start Date End Date Taking? Authorizing Provider  allopurinol (ZYLOPRIM) 100 MG tablet TAKE 2 TABLETS BY MOUTH  DAILY 06/01/17  Yes Tower, [name], MD  Ascorbic Acid (VITAMIN C) 100 MG tablet Take 100 mg by mouth daily.   Yes [provider]  aspirin 81 MG tablet Take 81 mg by mouth daily.  Yes [provider]  calcium-vitamin D (CALCIUM 500 +D) 500 MG tablet Take 1 tablet by mouth daily.    Yes [provider]  diphenoxylate-atropine (LOMOTIL) 2.5-0.025 MG tablet Take 1 tablet by mouth 4 (four) times daily as needed for diarrhea or loose stools. 12/24/15  Yes Tanna Furry, MD  gabapentin (NEURONTIN) 300 MG capsule Take 300 mg by mouth 2 (two) times daily.  09/06/17  Yes [provider]  glucosamine-chondroitin 500-400 MG tablet Take 1 tablet by mouth daily.   Yes [provider]  HYDROcodone-acetaminophen (VICODIN) 5-500 MG per tablet Take 1 tablet by mouth See admin instructions. Take 5 tablets by mouth daily per daughter   Yes [provider]  losartan (COZAAR) 100 MG tablet TAKE 1 TABLET BY MOUTH  DAILY 06/01/17  Yes Tower, [name], MD  metoprolol tartrate (LOPRESSOR) 25 MG tablet TAKE 1 TABLET BY MOUTH  TWICE A DAY 06/01/17  Yes Tower, [name], MD  multivitamin [facility]) per tablet Take 1 tablet by mouth daily.     Yes [provider]  PARoxetine (PAXIL) 30 MG tablet TAKE 1 TABLET BY MOUTH  EVERY MORNING 06/01/17  Yes Tower, Marne A, MD  pregabalin (LYRICA) 50 MG capsule Take 50 mg by mouth 2 (two) times daily.    Yes [provider]  raloxifene (EVISTA) 60 MG tablet TAKE 1 TABLET BY MOUTH DAILY. 06/01/17  Yes Tower, [name], MD  ALPRAZolam Duanne Moron) 0.5 MG tablet 1/2 pill by mouth up to twice daily as needed for anxiety 06/01/17   Tower, [name], MD  meclizine (ANTIVERT) 25 MG tablet TAKE 1 TABLET (25 MG TOTAL) BY MOUTH 3 (THREE) TIMES  DAILY AS NEEDED. 12/11/16   Tower, [name], MD      VITAL SIGNS:  Blood pressure 128/64, pulse 100, temperature 98 F (36.7 C), temperature source Oral, resp. rate 20, weight 125 lb (56.7 kg), last menstrual period 11/02/1980, SpO2 (!) 85 %.  PHYSICAL EXAMINATION:  Physical Exam  GENERAL:  83 y.o.-year-old patient lying in the bed with no acute distress.  EYES: Pupils equal, round, reactive to light and accommodation. No scleral icterus. Extraocular muscles intact.  HEENT: Head atraumatic, normocephalic. Oropharynx and nasopharynx clear.  NECK:  Supple, no jugular venous distention. No thyroid enlargement, no tenderness.  LUNGS: Normal breath sounds bilaterally, no wheezing, rales,rhonchi or crepitation. No use of accessory muscles of respiration.  CARDIOVASCULAR: S1, S2 normal. No murmurs, rubs, or gallops.  ABDOMEN: Soft, nontender, nondistended. Bowel sounds present. No organomegaly or  mass.  EXTREMITIES: No pedal edema, cyanosis, or clubbing.  NEUROLOGIC: Cranial nerves II through XII are intact. Muscle strength 5/5 in all extremities. Sensation intact. Gait not checked.  PSYCHIATRIC: The patient is alert and oriented x 3.  SKIN: No obvious rash, lesion, or ulcer.   LABORATORY PANEL:   CBC Recent Labs  Lab 10/13/17 1151  WBC 14.6*  HGB 12.8  HCT 38.6  PLT 173   ------------------------------------------------------------------------------------------------------------------  Chemistries  Recent Labs  Lab 10/13/17 1151  NA 134*  K 3.8  CL 98*  CO2 29  GLUCOSE 105*  BUN 29*  CREATININE 0.89  CALCIUM 8.7*  AST 46*  ALT 18  ALKPHOS 55  BILITOT 1.1   ------------------------------------------------------------------------------------------------------------------  Cardiac Enzymes No results for input(s): TROPONINI in the last 168 hours. ------------------------------------------------------------------------------------------------------------------  RADIOLOGY:  Ct Abdomen Pelvis W Contrast  Result Date: 10/13/2017 CLINICAL DATA:  Acute lower abdominal pain. EXAM: CT ABDOMEN AND PELVIS WITH CONTRAST TECHNIQUE: Multidetector  CT imaging of the abdomen and pelvis was performed using the standard protocol following bolus administration of intravenous contrast. CONTRAST:  58mL ISOVUE-300 IOPAMIDOL (ISOVUE-300) INJECTION 61% COMPARISON:  CT scan of December 24, 2015. FINDINGS: Lower chest: No acute abnormality. Hepatobiliary: No gallstones are noted. Stable right hepatic cyst is noted. Pancreas: Unremarkable. No pancreatic ductal dilatation or surrounding inflammatory changes. Spleen: Normal in size without focal abnormality. Adrenals/Urinary Tract: Adrenal glands are unremarkable. Kidneys are normal, without renal calculi, focal lesion, or hydronephrosis. Bladder is unremarkable. There has been interval placement of surgical device around the bladder,  potentially to prevent prolapse. Stomach/Bowel: The stomach appears normal. Status post appendectomy. Status post partial sigmoid colon resection. Severe wall thickening and inflammatory changes are seen involving the splenic flexure and descending colon concerning for infectious or inflammatory colitis. There is no evidence of bowel obstruction. Vascular/Lymphatic: Aortic atherosclerosis. No enlarged abdominal or pelvic lymph nodes. Reproductive: Uterus and bilateral adnexa are unremarkable. Other: No abdominal wall hernia or abnormality. No abdominopelvic ascites. Musculoskeletal: Stable old compression fractures are seen involving the T9, T12 and L2 vertebral bodies. No acute abnormality is noted. IMPRESSION: Severe wall thickening inflammatory changes are seen involving the splenic flexure and descending colon concerning for infectious or inflammatory colitis. Aortic atherosclerosis. Electronically Signed   By: Marijo Conception, M.D.   On: 10/13/2017 14:56      IMPRESSION AND PLAN:   Sepsis due to C. difficile colitis. The patient will be admitted to medical floor. Continue vancomycin p.o., follow-up CBC and cultures.  Hypotension due to sepsis and dehydration. Continue IV fluids support.  Hyponatremia and dehydration.  Continue IV fluid support in the follow-up BMP.  Generalized weakness.  PT evaluation after the patient is stable.  Hypertension.  Hold hypertension medication for now.  All the records are reviewed and case discussed with ED provider. Management plans discussed with the patient, her daughter and the son and they are in agreement.  CODE STATUS: Full code  TOTAL TIME TAKING CARE OF THIS PATIENT: 56 minutes.    Demetrios Loll M.D on 10/13/2017 at 4:30 PM  Between 7am to 6pm - Pager - [phone]  After 6pm go to [email or web address] - [employer] [facility]  Office  [phone]  CC: Primary care physician; Tower, [name], MD   Note:  This dictation was prepared with Dragon dictation along with smaller phrase technology. Any transcriptional errors that result from this process are unin

## 2017-10-13 NOTE — Progress Notes (Signed)
CODE SEPSIS - PHARMACY COMMUNICATION  **Broad Spectrum Antibiotics should be administered within 1 hour of Sepsis diagnosis**  Time Code Sepsis Called/Page Received:12/12 @ 1554  Antibiotics Ordered: Oral vancomycin 125mg    Time of 1st antibiotic administration: 1212 @ 1632  Additional action taken by pharmacy: NA  If necessary, Name of Provider/Nurse Contacted: NA   Pernell Dupre, PharmD, BCPS Clinical Pharmacist 10/13/2017 4:37 PM

## 2017-10-13 NOTE — Patient Instructions (Signed)
Please go to [facility] ER

## 2017-10-13 NOTE — ED Notes (Signed)
Dr. Reita Cliche notified that pt was positive for C-Diff

## 2017-10-13 NOTE — ED Notes (Signed)
Pt in NAD at this time and is attempting to drink oral contrast. Pt has family at bedside.

## 2017-10-13 NOTE — Progress Notes (Signed)
   Subjective:    Patient ID: Michelle Gregory, female    DOB: 1923/10/31, 83 y.o.   MRN: [id number]  HPI This is a 83 yo female, accompanied by her daughter [name], who presents today with diarrhea x 6 days.  Started as a few episodes and has now progressed to every 30 minutes. Feels nauseous, no vomiting. Abdominal pain across abdomen, prior to bowel movement. Sharp and cramping. Blood on tissue paper, ? Pink on pads. Felt a little light headed yesterday. Bowel movements watery. No sick contacts, started nitrofurantoin about 3 weeks ago and had a pessary placed.  She would like to go to hospital, her daughter is concerned about her mother catching the flu.   Past Medical History:  Diagnosis Date  . Allergic rhinitis   . Anxiety   . Cancer ([place])   . Depression   . GERD (gastroesophageal reflux disease)   . History of colon cancer   . Hyperlipidemia   . Hypertension   . Osteoarthritis   . Spinal compression fracture ([place])   . Vertigo    Past Surgical History:  Procedure Laterality Date  . APPENDECTOMY    . CATARACT EXTRACTION    . COLON RESECTION    . TONSILLECTOMY     Family History  Problem Relation Age of Onset  . Prostate cancer Father   . Heart attack Mother   . Gout Mother    Social History   Tobacco Use  . Smoking status: Never Smoker  . Smokeless tobacco: Never Used  Substance Use Topics  . Alcohol use: No    Alcohol/week: 0.0 oz  . Drug use: No      Review of Systems Per HPI    Objective:   Physical Exam  Constitutional: She appears well-developed and well-nourished. No distress.  Frail appearing, in wheelchair.   Eyes: Conjunctivae are normal.  Cardiovascular: Regular rhythm and normal heart sounds. Tachycardia present.  Pulmonary/Chest: Effort normal and breath sounds normal.  Neurological: She is alert.  Skin: She is not diaphoretic.  Psychiatric: She has a normal mood and affect. Her behavior is normal. Judgment and thought content normal.  Vitals  reviewed.     BP (!) 84/48 (BP Location: Left Arm, Patient Position: Sitting, Cuff Size: Normal)   Pulse (!) 119   Temp 97.8 F (36.6 C) (Oral)   LMP 11/02/1980   SpO2 98%      Assessment & Plan:  1. Diarrhea, unspecified type - given severity and duration of symptoms, have recommended that patient be taken to ER. Patient and her daughter agree. Report called to triage nurse at [place].   2. Hypotension due to hypovolemia - see #1   Clarene Reamer, FNP-BC  [facility]  10/13/2017 11:12 AM

## 2017-10-13 NOTE — ED Notes (Signed)
CODE SEPSIS CALLED TO [facility] AT [facility] 

## 2017-10-13 NOTE — ED Notes (Signed)
Patient transported to CT 

## 2017-10-13 NOTE — ED Triage Notes (Signed)
Lower abdominal pain right and leg, diarrhea x 4 days. Pt alert and oriented X4, active, cooperative, pt in NAD. RR even and unlabored, color WNL. Multiple falls over the last few days due to weakness.

## 2017-10-13 NOTE — ED Notes (Signed)
Pt stating diarrhea for the last 4 days. Pt stating nausea with no vomiting. Pt stating lower abdominal pain that is cramping and is relieved with BM. Pt appears weak and tired at this time. Nurse provided a warm blanket.

## 2017-10-14 ENCOUNTER — Other Ambulatory Visit: Payer: Self-pay

## 2017-10-14 LAB — BASIC METABOLIC PANEL
Anion gap: 11 (ref 5–15)
BUN: 20 mg/dL (ref 6–20)
CO2: 21 mmol/L — ABNORMAL LOW (ref 22–32)
Calcium: 7.9 mg/dL — ABNORMAL LOW (ref 8.9–10.3)
Chloride: 102 mmol/L (ref 101–111)
Creatinine, Ser: 0.7 mg/dL (ref 0.44–1.00)
GFR calc Af Amer: >60 mL/min (ref >60)
GFR calc non Af Amer: >60 mL/min (ref >60)
Glucose, Bld: 96 mg/dL (ref 65–99)
Potassium: 3.5 mmol/L (ref 3.5–5.1)
Sodium: 134 mmol/L — ABNORMAL LOW (ref 135–145)

## 2017-10-14 LAB — CBC
HCT: 33.8 % — ABNORMAL LOW (ref 35.0–47.0)
Hemoglobin: 11.1 g/dL — ABNORMAL LOW (ref 12.0–16.0)
MCH: 32.6 pg (ref 26.0–34.0)
MCHC: 33 g/dL (ref 32.0–36.0)
MCV: 98.7 fL (ref 80.0–100.0)
Platelets: 180 10*3/uL (ref 150–440)
RBC: 3.42 MIL/uL — ABNORMAL LOW (ref 3.80–5.20)
RDW: 13.3 % (ref 11.5–14.5)
WBC: 15.4 10*3/uL — ABNORMAL HIGH (ref 3.6–11.0)

## 2017-10-14 MED ORDER — HYDROCODONE-ACETAMINOPHEN 5-325 MG PO TABS
1.0000 | ORAL_TABLET | Freq: Four times a day (QID) | ORAL | Status: DC | PRN
  Administered 2017-10-15 – 2017-10-18 (×6): 1 via ORAL
  Filled 2017-10-14 (×6): qty 1

## 2017-10-14 MED ORDER — MAGNESIUM SULFATE 4 GM/100ML IV SOLN
4.0000 g | Freq: Once | INTRAVENOUS | Status: AC
  Administered 2017-10-14: 12:00:00 4 g via INTRAVENOUS
  Filled 2017-10-14: qty 100

## 2017-10-14 MED ORDER — METOPROLOL TARTRATE 25 MG PO TABS
25.0000 mg | ORAL_TABLET | Freq: Two times a day (BID) | ORAL | Status: DC
  Administered 2017-10-14 – 2017-10-18 (×9): 25 mg via ORAL
  Filled 2017-10-14 (×9): qty 1

## 2017-10-14 NOTE — Progress Notes (Signed)
Attempted several times to get UA.  Pt unable to control stool and contaminates specimen each time.  Dorna Bloom RN

## 2017-10-15 LAB — URINALYSIS, ROUTINE W REFLEX MICROSCOPIC
Bilirubin Urine: NEGATIVE
Glucose, UA: NEGATIVE mg/dL
Hgb urine dipstick: NEGATIVE
Ketones, ur: 20 mg/dL — AB
Nitrite: NEGATIVE
Protein, ur: 30 mg/dL — AB
Specific Gravity, Urine: 1.019 (ref 1.005–1.030)
pH: 5 (ref 5.0–8.0)

## 2017-10-15 MED ORDER — DEXTROSE 5 % IV SOLN
1.0000 g | INTRAVENOUS | Status: DC
  Administered 2017-10-15 – 2017-10-17 (×3): 1 g via INTRAVENOUS
  Filled 2017-10-15 (×5): qty 10

## 2017-10-15 NOTE — Progress Notes (Signed)
Urologist called me, Not very sure, if this is UTI or not, but he suggest finish 5 days course and let her follow in urology clinic.

## 2017-10-15 NOTE — Progress Notes (Signed)
PT Cancellation Note  Patient Details Name: Michelle Gregory MRN: [id number] DOB: 01-Jun-1923   Cancelled Treatment:    Reason Eval/Treat Not Completed: Fatigue/lethargy limiting ability to participate(Consult received and chart reviewed.  Patient sleeping soundly upon arrival to room.  Briefly speaks to therapist with max cuing, but does not open eyes or demonstrate ability to appropriately, actively participate with session. Will continue efforts as medically appropriate and able to participate.)   Johnnay Pleitez H. Owens Shark, PT, DPT, NCS 10/15/17, 2:22 PM [phone]

## 2017-10-15 NOTE — Progress Notes (Signed)
[place] at [place] NAME: Michelle Gregory    MR#:  [id number]  DATE OF BIRTH:  Nov 16, 1922  SUBJECTIVE:  CHIEF COMPLAINT:   Chief Complaint  Patient presents with  . Diarrhea  . Abdominal Pain    Came with Diarrhea and weakness. Daughter in room. Pt is very independent at baseline.   Have still have loose stool, but decreased frequency.  REVIEW OF SYSTEMS:  CONSTITUTIONAL: No fever,positive for fatigue or weakness.  EYES: No blurred or double vision.  EARS, NOSE, AND THROAT: No tinnitus or ear pain.  RESPIRATORY: No cough, shortness of breath, wheezing or hemoptysis.  CARDIOVASCULAR: No chest pain, orthopnea, edema.  GASTROINTESTINAL: No nausea, vomiting,have some diarrhea or abdominal pain.  GENITOURINARY: No dysuria, hematuria.  ENDOCRINE: No polyuria, nocturia,  HEMATOLOGY: No anemia, easy bruising or bleeding SKIN: No rash or lesion. MUSCULOSKELETAL: No joint pain or arthritis.   NEUROLOGIC: No tingling, numbness, weakness.  PSYCHIATRY: No anxiety or depression.   ROS  DRUG ALLERGIES:   Allergies  Allergen Reactions  . Amoxicillin-Pot Clavulanate     REACTION: diarrhea  . Keflex [Cephalexin] Diarrhea  . Septra [Sulfamethoxazole-Trimethoprim] Nausea Only    VITALS:  Blood pressure 120/64, pulse (!) 115, temperature 98.4 F (36.9 C), temperature source Oral, resp. rate 18, weight 60.1 kg (132 lb 8 oz), last menstrual period 11/02/1980, SpO2 93 %.  PHYSICAL EXAMINATION:  GENERAL:  83 y.o.-year-old patient lying in the bed with no acute distress.  EYES: Pupils equal, round, reactive to light and accommodation. No scleral icterus. Extraocular muscles intact.  HEENT: Head atraumatic, normocephalic. Oropharynx and nasopharynx clear.  NECK:  Supple, no jugular venous distention. No thyroid enlargement, no tenderness.  LUNGS: Normal breath sounds bilaterally, no wheezing, rales,rhonchi or crepitation. No use of accessory muscles of  respiration.  CARDIOVASCULAR: S1, S2 normal. No murmurs, rubs, or gallops.  ABDOMEN: Soft, nontender, nondistended. Bowel sounds present. No organomegaly or mass.  EXTREMITIES: No pedal edema, cyanosis, or clubbing.  NEUROLOGIC: Cranial nerves II through XII are intact. Muscle strength 4-5/5 in all extremities. Sensation intact. Gait not checked.  PSYCHIATRIC: The patient is alert and oriented x 2.  SKIN: No obvious rash, lesion, or ulcer.   Physical Exam LABORATORY PANEL:   CBC Recent Labs  Lab 10/14/17 0343  WBC 15.4*  HGB 11.1*  HCT 33.8*  PLT 180   ------------------------------------------------------------------------------------------------------------------  Chemistries  Recent Labs  Lab 10/13/17 1151 10/13/17 1836 10/14/17 0343  NA 134*  --  134*  K 3.8  --  3.5  CL 98*  --  102  CO2 29  --  21*  GLUCOSE 105*  --  96  BUN 29*  --  20  CREATININE 0.89  --  0.70  CALCIUM 8.7*  --  7.9*  MG  --  1.3*  --   AST 46*  --   --   ALT 18  --   --   ALKPHOS 55  --   --   BILITOT 1.1  --   --    ------------------------------------------------------------------------------------------------------------------  Cardiac Enzymes No results for input(s): TROPONINI in the last 168 hours. ------------------------------------------------------------------------------------------------------------------  RADIOLOGY:  No results found.  ASSESSMENT AND PLAN:   Active Problems:   C. difficile colitis  * Sepsis due to C. difficile colitis. Continue vancomycin p.o., follow-up CBC and cultures.   Will need to give 10 days after the Abx for UTI is stopped.  * Hypotension due to sepsis and dehydration.  Continue IV fluids support. Hold BP meds.   Now BP stable, so stop Iv fluids and resume metoprolol.  * Hyponatremia and dehydration.  Continue IV fluid support in the follow-up BMP.  * UTI   IV rocephin.   Ur cx   As this is repeated UTI, will call Urology consult.     Pt have hx of bladder prolapse and have some pasary ( Support ) placed,  * Generalized weakness.  PT evaluation after the patient is stable.  * Hypertension.  resume metoprolol now.   All the records are reviewed and case discussed with Care Management/Social Workerr. Management plans discussed with the patient, family and they are in agreement.  CODE STATUS: Full.  TOTAL TIME TAKING CARE OF THIS PATIENT: 35 minutes.    POSSIBLE D/C IN 1-2 DAYS, DEPENDING ON CLINICAL CONDITION.   Vaughan Basta M.D on 10/15/2017   Between 7am to 6pm - Pager - [phone]  After 6pm go to [email or web address] - password [email or web address] [facility]  Office  [phone]  CC: Primary care physician; Tower, [name], MD  Note: This dictation was prepared with Dragon dictation along with smaller phrase technology. Any transcriptional errors that result from this process are unintentional.

## 2017-10-15 NOTE — Progress Notes (Signed)
[place] at [place] NAME: Michelle Gregory    MR#:  [id number]  DATE OF BIRTH:  1923/07/08  SUBJECTIVE:  CHIEF COMPLAINT:   Chief Complaint  Patient presents with  . Diarrhea  . Abdominal Pain    Came with Diarrhea and weakness. Daughter in room. Pt is very independent at baseline.  REVIEW OF SYSTEMS:  CONSTITUTIONAL: No fever,positive for fatigue or weakness.  EYES: No blurred or double vision.  EARS, NOSE, AND THROAT: No tinnitus or ear pain.  RESPIRATORY: No cough, shortness of breath, wheezing or hemoptysis.  CARDIOVASCULAR: No chest pain, orthopnea, edema.  GASTROINTESTINAL: No nausea, vomiting,have some diarrhea or abdominal pain.  GENITOURINARY: No dysuria, hematuria.  ENDOCRINE: No polyuria, nocturia,  HEMATOLOGY: No anemia, easy bruising or bleeding SKIN: No rash or lesion. MUSCULOSKELETAL: No joint pain or arthritis.   NEUROLOGIC: No tingling, numbness, weakness.  PSYCHIATRY: No anxiety or depression.   ROS  DRUG ALLERGIES:   Allergies  Allergen Reactions  . Amoxicillin-Pot Clavulanate     REACTION: diarrhea  . Keflex [Cephalexin] Diarrhea  . Septra [Sulfamethoxazole-Trimethoprim] Nausea Only    VITALS:  Blood pressure (!) 156/79, pulse (!) 123, temperature 98.5 F (36.9 C), temperature source Oral, resp. rate 18, weight 60.1 kg (132 lb 8 oz), last menstrual period 11/02/1980, SpO2 95 %.  PHYSICAL EXAMINATION:  GENERAL:  83 y.o.-year-old patient lying in the bed with no acute distress.  EYES: Pupils equal, round, reactive to light and accommodation. No scleral icterus. Extraocular muscles intact.  HEENT: Head atraumatic, normocephalic. Oropharynx and nasopharynx clear.  NECK:  Supple, no jugular venous distention. No thyroid enlargement, no tenderness.  LUNGS: Normal breath sounds bilaterally, no wheezing, rales,rhonchi or crepitation. No use of accessory muscles of respiration.  CARDIOVASCULAR: S1, S2 normal. No  murmurs, rubs, or gallops.  ABDOMEN: Soft, nontender, nondistended. Bowel sounds present. No organomegaly or mass.  EXTREMITIES: No pedal edema, cyanosis, or clubbing.  NEUROLOGIC: Cranial nerves II through XII are intact. Muscle strength 4-5/5 in all extremities. Sensation intact. Gait not checked.  PSYCHIATRIC: The patient is alert and oriented x 2.  SKIN: No obvious rash, lesion, or ulcer.   Physical Exam LABORATORY PANEL:   CBC Recent Labs  Lab 10/14/17 0343  WBC 15.4*  HGB 11.1*  HCT 33.8*  PLT 180   ------------------------------------------------------------------------------------------------------------------  Chemistries  Recent Labs  Lab 10/13/17 1151 10/13/17 1836 10/14/17 0343  NA 134*  --  134*  K 3.8  --  3.5  CL 98*  --  102  CO2 29  --  21*  GLUCOSE 105*  --  96  BUN 29*  --  20  CREATININE 0.89  --  0.70  CALCIUM 8.7*  --  7.9*  MG  --  1.3*  --   AST 46*  --   --   ALT 18  --   --   ALKPHOS 55  --   --   BILITOT 1.1  --   --    ------------------------------------------------------------------------------------------------------------------  Cardiac Enzymes No results for input(s): TROPONINI in the last 168 hours. ------------------------------------------------------------------------------------------------------------------  RADIOLOGY:  Ct Abdomen Pelvis W Contrast  Result Date: 10/13/2017 CLINICAL DATA:  Acute lower abdominal pain. EXAM: CT ABDOMEN AND PELVIS WITH CONTRAST TECHNIQUE: Multidetector CT imaging of the abdomen and pelvis was performed using the standard protocol following bolus administration of intravenous contrast. CONTRAST:  72mL ISOVUE-300 IOPAMIDOL (ISOVUE-300) INJECTION 61% COMPARISON:  CT scan of December 24, 2015. FINDINGS: Lower chest:  No acute abnormality. Hepatobiliary: No gallstones are noted. Stable right hepatic cyst is noted. Pancreas: Unremarkable. No pancreatic ductal dilatation or surrounding inflammatory  changes. Spleen: Normal in size without focal abnormality. Adrenals/Urinary Tract: Adrenal glands are unremarkable. Kidneys are normal, without renal calculi, focal lesion, or hydronephrosis. Bladder is unremarkable. There has been interval placement of surgical device around the bladder, potentially to prevent prolapse. Stomach/Bowel: The stomach appears normal. Status post appendectomy. Status post partial sigmoid colon resection. Severe wall thickening and inflammatory changes are seen involving the splenic flexure and descending colon concerning for infectious or inflammatory colitis. There is no evidence of bowel obstruction. Vascular/Lymphatic: Aortic atherosclerosis. No enlarged abdominal or pelvic lymph nodes. Reproductive: Uterus and bilateral adnexa are unremarkable. Other: No abdominal wall hernia or abnormality. No abdominopelvic ascites. Musculoskeletal: Stable old compression fractures are seen involving the T9, T12 and L2 vertebral bodies. No acute abnormality is noted. IMPRESSION: Severe wall thickening inflammatory changes are seen involving the splenic flexure and descending colon concerning for infectious or inflammatory colitis. Aortic atherosclerosis. Electronically Signed   By: Marijo Conception, M.D.   On: 10/13/2017 14:56    ASSESSMENT AND PLAN:   Active Problems:   C. difficile colitis  * Sepsis due to C. difficile colitis. Continue vancomycin p.o., follow-up CBC and cultures.  * Hypotension due to sepsis and dehydration. Continue IV fluids support. Hold BP meds.  * Hyponatremia and dehydration.  Continue IV fluid support in the follow-up BMP.  * Generalized weakness.  PT evaluation after the patient is stable.  * Hypertension.  Hold hypertension medication for now.   All the records are reviewed and case discussed with Care Management/Social Workerr. Management plans discussed with the patient, family and they are in agreement.  CODE STATUS: Full.  TOTAL TIME  TAKING CARE OF THIS PATIENT: 35 minutes.     POSSIBLE D/C IN 1-2 DAYS, DEPENDING ON CLINICAL CONDITION.   Vaughan Basta M.D on 10/15/2017   Between 7am to 6pm - Pager - [phone]  After 6pm go to [email or web address] - password [email or web address] [facility]  Office  [phone]  CC: Primary care physician; Tower, [name], MD  Note: This dictation was prepared with Dragon dictation along with smaller phrase technology. Any transcriptional errors that result from this process are unintentional.

## 2017-10-15 NOTE — Care Management Important Message (Signed)
Important Message  Patient Details  Name: Michelle Gregory MRN: [id number] Date of Birth: [birth year]   Medicare Important Message Given:  Yes    Shelbie Ammons, RN 10/15/2017, 7:17 AM

## 2017-10-16 LAB — BASIC METABOLIC PANEL
Anion gap: 6 (ref 5–15)
BUN: 18 mg/dL (ref 6–20)
CO2: 25 mmol/L (ref 22–32)
Calcium: 7.6 mg/dL — ABNORMAL LOW (ref 8.9–10.3)
Chloride: 107 mmol/L (ref 101–111)
Creatinine, Ser: 0.43 mg/dL — ABNORMAL LOW (ref 0.44–1.00)
GFR calc Af Amer: >60 mL/min (ref >60)
GFR calc non Af Amer: >60 mL/min (ref >60)
Glucose, Bld: 108 mg/dL — ABNORMAL HIGH (ref 65–99)
Potassium: 3.3 mmol/L — ABNORMAL LOW (ref 3.5–5.1)
Sodium: 138 mmol/L (ref 135–145)

## 2017-10-16 LAB — URINE CULTURE: Culture: NO GROWTH

## 2017-10-16 LAB — CBC
HCT: 35.3 % (ref 35.0–47.0)
Hemoglobin: 11.7 g/dL — ABNORMAL LOW (ref 12.0–16.0)
MCH: 32.7 pg (ref 26.0–34.0)
MCHC: 33.2 g/dL (ref 32.0–36.0)
MCV: 98.5 fL (ref 80.0–100.0)
Platelets: 191 10*3/uL (ref 150–440)
RBC: 3.58 MIL/uL — ABNORMAL LOW (ref 3.80–5.20)
RDW: 13.3 % (ref 11.5–14.5)
WBC: 15.4 10*3/uL — ABNORMAL HIGH (ref 3.6–11.0)

## 2017-10-16 LAB — MAGNESIUM: Magnesium: 1.5 mg/dL — ABNORMAL LOW (ref 1.7–2.4)

## 2017-10-16 MED ORDER — DILTIAZEM HCL 25 MG/5ML IV SOLN
10.0000 mg | Freq: Once | INTRAVENOUS | Status: AC
  Administered 2017-10-16: 10 mg via INTRAVENOUS
  Filled 2017-10-16: qty 5

## 2017-10-16 MED ORDER — POTASSIUM CHLORIDE CRYS ER 20 MEQ PO TBCR
20.0000 meq | EXTENDED_RELEASE_TABLET | Freq: Two times a day (BID) | ORAL | Status: AC
Start: 2017-10-16 — End: 2017-10-17
  Administered 2017-10-16 – 2017-10-17 (×4): 20 meq via ORAL
  Filled 2017-10-16 (×4): qty 1

## 2017-10-16 MED ORDER — SODIUM CHLORIDE 0.9 % IV BOLUS (SEPSIS)
500.0000 mL | Freq: Once | INTRAVENOUS | Status: AC
  Administered 2017-10-16: 500 mL via INTRAVENOUS

## 2017-10-16 MED ORDER — ORAL CARE MOUTH RINSE
15.0000 mL | Freq: Two times a day (BID) | OROMUCOSAL | Status: DC
  Administered 2017-10-16 – 2017-10-20 (×6): 15 mL via OROMUCOSAL

## 2017-10-16 MED ORDER — MAGNESIUM SULFATE 4 GM/100ML IV SOLN
4.0000 g | Freq: Once | INTRAVENOUS | Status: AC
  Administered 2017-10-16: 4 g via INTRAVENOUS
  Filled 2017-10-16: qty 100

## 2017-10-16 MED ORDER — SODIUM CHLORIDE 0.9 % IV BOLUS (SEPSIS)
1000.0000 mL | INTRAVENOUS | Status: DC | PRN
  Administered 2017-10-16: 1000 mL via INTRAVENOUS

## 2017-10-16 MED ORDER — METOPROLOL TARTRATE 5 MG/5ML IV SOLN
5.0000 mg | Freq: Four times a day (QID) | INTRAVENOUS | Status: DC | PRN
  Administered 2017-10-16 – 2017-10-19 (×2): 5 mg via INTRAVENOUS
  Filled 2017-10-16 (×3): qty 5

## 2017-10-16 MED ORDER — DILTIAZEM HCL 25 MG/5ML IV SOLN
10.0000 mg | Freq: Once | INTRAVENOUS | Status: AC
Start: 2017-10-16 — End: 2017-10-16
  Administered 2017-10-16: 04:00:00 10 mg via INTRAVENOUS
  Filled 2017-10-16: qty 5

## 2017-10-16 NOTE — Plan of Care (Signed)
Pt transferred this shift from 1C . Alert, oriented to self only. VSS. RA . Enteric isolation precautions maintained. Pt is incontinent of stool. IVF bolus given this shift per order. Will continue to monitor and report to oncoming RN .  Progressing Education: Knowledge of General Education information will improve 10/16/2017 1349 - Progressing by Aleen Campi, RN Health Behavior/Discharge Planning: Ability to manage health-related needs will improve 10/16/2017 1349 - Progressing by Aleen Campi, RN Clinical Measurements: Ability to maintain clinical measurements within normal limits will improve 10/16/2017 1349 - Progressing by Aleen Campi, RN Will remain free from infection 10/16/2017 1349 - Progressing by Aleen Campi, RN Diagnostic test results will improve 10/16/2017 1349 - Progressing by Aleen Campi, RN Respiratory complications will improve 10/16/2017 1349 - Progressing by Aleen Campi, RN Cardiovascular complication will be avoided 10/16/2017 1349 - Progressing by Aleen Campi, RN Activity: Risk for activity intolerance will decrease 10/16/2017 1349 - Progressing by Aleen Campi, RN Nutrition: Adequate nutrition will be maintained 10/16/2017 1349 - Progressing by Aleen Campi, RN Coping: Level of anxiety will decrease 10/16/2017 1349 - Progressing by Aleen Campi, RN Elimination: Will not experience complications related to bowel motility 10/16/2017 1349 - Progressing by Aleen Campi, RN Will not experience complications related to urinary retention 10/16/2017 1349 - Progressing by Aleen Campi, RN Pain Managment: General experience of comfort will improve 10/16/2017 1349 - Progressing by Aleen Campi, RN Safety: Ability to remain free from injury will improve 10/16/2017 1349 - Progressing by Aleen Campi, RN Skin Integrity: Risk for impaired skin integrity will decrease 10/16/2017 1349 - Progressing by Aleen Campi, RN

## 2017-10-16 NOTE — Consult Note (Signed)
Michelle Gregory is a 83 y.o. female  [id number]  Primary Cardiologist: Neoma Laming Reason for Consultation: Tachycardia  HPI: This is a 83 year old white female with a past medical history of colon cancer depression hypertension presented to the hospital with diarrhea and was found to have C. difficile colitis with possible sepsis. I was asked to evaluate the patient because of tachycardia. Patient denies any chest pain or shortness of breath.   Review of Systems: Patient admits to having fatigue and tiredness and diarrhea but no chest pain or palpitation   Past Medical History:  Diagnosis Date  . Allergic rhinitis   . Anxiety   . Cancer ([place])   . Depression   . GERD (gastroesophageal reflux disease)   . History of colon cancer   . Hyperlipidemia   . Hypertension   . Osteoarthritis   . Spinal compression fracture ([place])   . Vertigo     Medications Prior to Admission  Medication Sig Dispense Refill  . allopurinol (ZYLOPRIM) 100 MG tablet TAKE 2 TABLETS BY MOUTH  DAILY 180 tablet 3  . Ascorbic Acid (VITAMIN C) 100 MG tablet Take 100 mg by mouth daily.    Marland Kitchen aspirin 81 MG tablet Take 81 mg by mouth daily.      . calcium-vitamin D (CALCIUM 500 +D) 500 MG tablet Take 1 tablet by mouth daily.     . diphenoxylate-atropine (LOMOTIL) 2.5-0.025 MG tablet Take 1 tablet by mouth 4 (four) times daily as needed for diarrhea or loose stools. 30 tablet 0  . gabapentin (NEURONTIN) 300 MG capsule Take 300 mg by mouth 2 (two) times daily.   6  . glucosamine-chondroitin 500-400 MG tablet Take 1 tablet by mouth daily.    Marland Kitchen HYDROcodone-acetaminophen (VICODIN) 5-500 MG per tablet Take 1 tablet by mouth See admin instructions. Take 5 tablets by mouth daily per daughter    . losartan (COZAAR) 100 MG tablet TAKE 1 TABLET BY MOUTH  DAILY 90 tablet 3  . metoprolol tartrate (LOPRESSOR) 25 MG tablet TAKE 1 TABLET BY MOUTH  TWICE A DAY 180 tablet 3  . multivitamin (THERAGRAN) per tablet Take 1 tablet by  mouth daily.      Marland Kitchen PARoxetine (PAXIL) 30 MG tablet TAKE 1 TABLET BY MOUTH  EVERY MORNING 90 tablet 3  . pregabalin (LYRICA) 50 MG capsule Take 50 mg by mouth 2 (two) times daily.     . raloxifene (EVISTA) 60 MG tablet TAKE 1 TABLET BY MOUTH DAILY. 90 tablet 3  . ALPRAZolam (XANAX) 0.5 MG tablet 1/2 pill by mouth up to twice daily as needed for anxiety 30 tablet 3  . meclizine (ANTIVERT) 25 MG tablet TAKE 1 TABLET (25 MG TOTAL) BY MOUTH 3 (THREE) TIMES  DAILY AS NEEDED. 270 tablet 0     . allopurinol  200 mg Oral Daily  . aspirin EC  81 mg Oral Daily  . enoxaparin (LOVENOX) injection  40 mg Subcutaneous Q24H  . gabapentin  300 mg Oral BID  . metoprolol tartrate  25 mg Oral BID  . PARoxetine  30 mg Oral q morning - 10a  . potassium chloride  20 mEq Oral BID  . raloxifene  60 mg Oral Daily  . vancomycin  125 mg Oral QID    Infusions: . cefTRIAXone (ROCEPHIN)  IV Stopped (10/15/17 1600)  . magnesium sulfate 1 - 4 g bolus IVPB    . sodium chloride Stopped (10/16/17 5956)    Allergies  Allergen Reactions  .  Amoxicillin-Pot Clavulanate     REACTION: diarrhea  . Keflex [Cephalexin] Diarrhea  . Septra [Sulfamethoxazole-Trimethoprim] Nausea Only    Social History   Socioeconomic History  . Marital status: Widowed    Spouse name: Not on file  . Number of children: 1  . Years of education: Not on file  . Highest education level: Not on file  Social Needs  . Financial resource strain: Not on file  . Food insecurity - worry: Not on file  . Food insecurity - inability: Not on file  . Transportation needs - medical: Not on file  . Transportation needs - non-medical: Not on file  Occupational History  . Occupation: retired  Tobacco Use  . Smoking status: Never Smoker  . Smokeless tobacco: Never Used  Substance and Sexual Activity  . Alcohol use: No    Alcohol/week: 0.0 oz  . Drug use: No  . Sexual activity: No  Other Topics Concern  . Not on file  Social History Narrative   . Not on file    Family History  Problem Relation Age of Onset  . Prostate cancer Father   . Heart attack Mother   . Gout Mother     PHYSICAL EXAM: Vitals:   10/16/17 0918 10/16/17 0953  BP:  116/65  Pulse: (!) 150 (!) 112  Resp:  18  Temp:    SpO2:  94%     Intake/Output Summary (Last 24 hours) at 10/16/2017 1133 Last data filed at 10/16/2017 9024 Gross per 24 hour  Intake 1000 ml  Output -  Net 1000 ml    General:  Well appearing. No respiratory difficulty HEENT: normal Neck: supple. no JVD. Carotids 2+ bilat; no bruits. No lymphadenopathy or thryomegaly appreciated. Cor: PMI nondisplaced. Regular rate & rhythm. No rubs, gallops or murmurs. Lungs: clear Abdomen: soft, nontender, nondistended. No hepatosplenomegaly. No bruits or masses. Good bowel sounds. Extremities: no cyanosis, clubbing, rash, edema Neuro: alert & oriented x 3, cranial nerves grossly intact. moves all 4 extremities w/o difficulty. Affect pleasant.  ECG: Sinus tachycardia with frequent APCs  Results for orders placed or performed during the hospital encounter of 10/13/17 (from the past 24 hour(s))  Urinalysis, Routine w reflex microscopic     Status: Abnormal   Collection Time: 10/15/17  1:07 PM  Result Value Ref Range   Color, Urine AMBER (A) YELLOW   APPearance HAZY (A) CLEAR   Specific Gravity, Urine 1.019 1.005 - 1.030   pH 5.0 5.0 - 8.0   Glucose, UA NEGATIVE NEGATIVE mg/dL   Hgb urine dipstick NEGATIVE NEGATIVE   Bilirubin Urine NEGATIVE NEGATIVE   Ketones, ur 20 (A) NEGATIVE mg/dL   Protein, ur 30 (A) NEGATIVE mg/dL   Nitrite NEGATIVE NEGATIVE   Leukocytes, UA MODERATE (A) NEGATIVE   RBC / HPF 6-30 0 - 5 RBC/hpf   WBC, UA TOO NUMEROUS TO COUNT 0 - 5 WBC/hpf   Bacteria, UA MANY (A) NONE SEEN   Squamous Epithelial / LPF 0-5 (A) NONE SEEN   Mucus PRESENT    Budding Yeast PRESENT    Hyaline Casts, UA PRESENT    Amorphous Crystal PRESENT   Magnesium     Status: Abnormal    Collection Time: 10/16/17  5:56 AM  Result Value Ref Range   Magnesium 1.5 (L) 1.7 - 2.4 mg/dL  CBC     Status: Abnormal   Collection Time: 10/16/17  6:04 AM  Result Value Ref Range   WBC 15.4 (H) 3.6 - 11.0  K/uL   RBC 3.58 (L) 3.80 - 5.20 MIL/uL   Hemoglobin 11.7 (L) 12.0 - 16.0 g/dL   HCT 35.3 35.0 - 47.0 %   MCV 98.5 80.0 - 100.0 fL   MCH 32.7 26.0 - 34.0 pg   MCHC 33.2 32.0 - 36.0 g/dL   RDW 13.3 11.5 - 14.5 %   Platelets 191 150 - 440 K/uL  Basic metabolic panel     Status: Abnormal   Collection Time: 10/16/17  6:04 AM  Result Value Ref Range   Sodium 138 135 - 145 mmol/L   Potassium 3.3 (L) 3.5 - 5.1 mmol/L   Chloride 107 101 - 111 mmol/L   CO2 25 22 - 32 mmol/L   Glucose, Bld 108 (H) 65 - 99 mg/dL   BUN 18 6 - 20 mg/dL   Creatinine, Ser 0.43 (L) 0.44 - 1.00 mg/dL   Calcium 7.6 (L) 8.9 - 10.3 mg/dL   GFR calc non Af Amer >60 >60 mL/min   GFR calc Af Amer >60 >60 mL/min   Anion gap 6 5 - 15   No results found.   ASSESSMENT AND PLAN: Sinus tachycardia with frequent atrial premature contractions secondary to hypotension and diarrhea and possible sepsis. Advise treating the underlying cause and no active treatment is needed for sinus tachycardia with atrial premature contraction. Advise replacing electrolytes and fluids.  Shayana Hornstein A

## 2017-10-16 NOTE — Progress Notes (Signed)
[place] at [place] NAME: Michelle Gregory    MR#:  [id number]  DATE OF BIRTH:  1923/08/07  SUBJECTIVE:   Patient here due to C. difficile colitis/diarrhea and also noted to have a urinary tract infection. Overnight patient developed significant tachycardia and therefore was transferred to telemetry. Denies any palpitations. Patient denies any other complaints presently.  REVIEW OF SYSTEMS:    Review of Systems  Constitutional: Negative for chills and fever.  HENT: Negative for congestion and tinnitus.   Eyes: Negative for blurred vision and double vision.  Respiratory: Negative for cough, shortness of breath and wheezing.   Cardiovascular: Negative for chest pain, orthopnea and PND.  Gastrointestinal: Positive for diarrhea. Negative for abdominal pain, nausea and vomiting.  Genitourinary: Negative for dysuria and hematuria.  Neurological: Positive for weakness. Negative for dizziness, sensory change and focal weakness.  All other systems reviewed and are negative.   Nutrition: Soft diet Tolerating Diet: Yes but little. Tolerating PT: Await Eval.    DRUG ALLERGIES:   Allergies  Allergen Reactions  . Amoxicillin-Pot Clavulanate     REACTION: diarrhea  . Keflex [Cephalexin] Diarrhea  . Septra [Sulfamethoxazole-Trimethoprim] Nausea Only    VITALS:  Blood pressure 116/65, pulse (!) 112, temperature (!) 97.3 F (36.3 C), temperature source Oral, resp. rate 18, height 5\' 3"  (1.6 m), weight 60.1 kg (132 lb 7.9 oz), last menstrual period 11/02/1980, SpO2 94 %.  PHYSICAL EXAMINATION:   Physical Exam  GENERAL:  83 y.o.-year-old patient lying in bed in no acute distress.  EYES: Pupils equal, round, reactive to light and accommodation. No scleral icterus. Extraocular muscles intact.  HEENT: Head atraumatic, normocephalic. Oropharynx and nasopharynx clear.  NECK:  Supple, no jugular venous distention. No thyroid enlargement, no tenderness.   LUNGS: Normal breath sounds bilaterally, no wheezing, rales, rhonchi. No use of accessory muscles of respiration.  CARDIOVASCULAR: S1, S2 RRR, Tachycardic. No murmurs, rubs, or gallops.  ABDOMEN: Soft, nontender, nondistended. Bowel sounds present. No organomegaly or mass.  EXTREMITIES: No cyanosis, clubbing or edema b/l.    NEUROLOGIC: Cranial nerves II through XII are intact. No focal Motor or sensory deficits b/l.   PSYCHIATRIC: The patient is alert and oriented x 3.  SKIN: No obvious rash, lesion, or ulcer.    LABORATORY PANEL:   CBC Recent Labs  Lab 10/16/17 0604  WBC 15.4*  HGB 11.7*  HCT 35.3  PLT 191   ------------------------------------------------------------------------------------------------------------------  Chemistries  Recent Labs  Lab 10/13/17 1151  10/16/17 0556 10/16/17 0604  NA 134*   < >  --  138  K 3.8   < >  --  3.3*  CL 98*   < >  --  107  CO2 29   < >  --  25  GLUCOSE 105*   < >  --  108*  BUN 29*   < >  --  18  CREATININE 0.89   < >  --  0.43*  CALCIUM 8.7*   < >  --  7.6*  MG  --    < > 1.5*  --   AST 46*  --   --   --   ALT 18  --   --   --   ALKPHOS 55  --   --   --   BILITOT 1.1  --   --   --    < > = values in this interval not displayed.   ------------------------------------------------------------------------------------------------------------------  Cardiac  Enzymes No results for input(s): TROPONINI in the last 168 hours. ------------------------------------------------------------------------------------------------------------------  RADIOLOGY:  No results found.   ASSESSMENT AND PLAN:   83 year old female with past medical history of hypertension, hyperlipidemia, history of colon cancer, osteoarthritis, depression, anxiety who presented to the hospital due to diarrhea secondary to C. difficile.  1. C. difficile colitis-this is a cause of patient's diarrhea. Diarrhea is improving. -Continue oral vancomycin. Continue  contact precautions.  2. SVT-patient developed some elevated heart rates overnight and was transferred to telemetry. EKG reveals sinus tachycardia with PACs. No evidence of atrial fibrillation. This is secondary to electrolyte abnormalities, dehydration. We'll give the patient IV fluids, replace her potassium and magnesium accordingly. Appreciate cardiology input.  3. Urinary tract infection-continue IV ceftriaxone, follow urine cultures.  4. Hypokalemia/hypomagnesemia-secondary to diarrhea. -Continue to replace and repeat level in the morning.  5. History of gout-continue allopurinol.  6. Anxiety/depression-continue Xanax, Paxil.  7. Essential hypertension-continue metoprolol.  8. Neuropathy-continue gabapentin.    All the records are reviewed and case discussed with Care Management/Social Worker. Management plans discussed with the patient, family and they are in agreement.  CODE STATUS: Full code  DVT Prophylaxis: Lovenox  TOTAL TIME TAKING CARE OF THIS PATIENT: 30 minutes.   POSSIBLE D/C IN 2-3 DAYS, DEPENDING ON CLINICAL CONDITION.   Henreitta Leber M.D on 10/16/2017 at 1:55 PM  Between 7am to 6pm - Pager - [phone]  After 6pm go to [email or web address] - [employer] [facility]  Office  [phone]  CC: Primary care physician; Tower, [name], MD

## 2017-10-16 NOTE — Progress Notes (Signed)
Pt. HR increasing to 150's without sustaining, HS lopressor given. Dr. Jannifer Franklin notified and new orders for PRN IV lopressor given for HR. Will continue to monitor pt.

## 2017-10-16 NOTE — Progress Notes (Signed)
PT Cancellation Note  Patient Details Name: Michelle Gregory MRN: [id number] DOB: Dec 25, 1922   Cancelled Treatment:    Reason Eval/Treat Not Completed: Medical issues which prohibited therapy.  Very tachycardic and now moved to telemetry, will ck later to see how pt is progressing.   Ramond Dial 10/16/2017, 9:48 AM   Mee Hives, PT [facility] Acute [facility] Dept. Number: [place] and [place]

## 2017-10-17 LAB — POTASSIUM: Potassium: 3.6 mmol/L (ref 3.5–5.1)

## 2017-10-17 LAB — MAGNESIUM: Magnesium: 1.8 mg/dL (ref 1.7–2.4)

## 2017-10-17 MED ORDER — WITCH HAZEL-GLYCERIN EX PADS
MEDICATED_PAD | CUTANEOUS | Status: DC | PRN
  Filled 2017-10-17: qty 100

## 2017-10-17 MED ORDER — POTASSIUM CHLORIDE IN NACL 20-0.9 MEQ/L-% IV SOLN
INTRAVENOUS | Status: DC
  Administered 2017-10-17 – 2017-10-19 (×4): via INTRAVENOUS
  Filled 2017-10-17 (×5): qty 1000

## 2017-10-17 MED ORDER — METRONIDAZOLE IN NACL 5-0.79 MG/ML-% IV SOLN
500.0000 mg | Freq: Three times a day (TID) | INTRAVENOUS | Status: DC
  Administered 2017-10-17 – 2017-10-20 (×9): 500 mg via INTRAVENOUS
  Filled 2017-10-17 (×12): qty 100

## 2017-10-17 MED ORDER — HYDROCORTISONE ACETATE 25 MG RE SUPP
25.0000 mg | Freq: Two times a day (BID) | RECTAL | Status: DC
  Administered 2017-10-17 – 2017-10-20 (×7): 25 mg via RECTAL
  Filled 2017-10-17 (×7): qty 1

## 2017-10-17 NOTE — Progress Notes (Signed)
Pt. Has only urinated 1 time throughout the night, bladder scanned 549ml in bladder. Prime paged awaiting call back.

## 2017-10-17 NOTE — Progress Notes (Signed)
Pt. Has not slept tonight. She has had multiple loose BM's related to c-diff, she also has an external hemorrhoid. Barrier cream applied. Pt. Also has MASD to bilateral buttocks and perineum. Pt. Has had to be re-oriented to place and situation multiple times throughout the night. She keeps thinking it is morning and she needs to go to church. Will continue to monitor pt. Bed alarm is on.

## 2017-10-17 NOTE — Progress Notes (Signed)
New order for In and Out cath, 560ml, amber urine return, pt. Tolerated well,

## 2017-10-17 NOTE — Progress Notes (Signed)
PT Cancellation Note  Patient Details Name: Michelle Gregory MRN: [id number] DOB: 02-28-1923   Cancelled Treatment:    Reason Eval/Treat Not Completed: Fatigue/lethargy limiting ability to participate.  Has not slept well and hurting but also just not up to therapy.  Can try later as time and pt allow.   Ramond Dial 10/17/2017, 9:44 AM   Mee Hives, PT [facility] Dept. Number: [place] and [place]

## 2017-10-17 NOTE — Progress Notes (Signed)
[facility] at [place] NAME: Michelle Gregory    MR#:  [id number]  DATE OF BIRTH:  01/12/1923  SUBJECTIVE:   Patient here due to C. difficile colitis/diarrhea and also noted to have a urinary tract infection.   Diarrhea has gotten worse. Patient has had 4-5 liquid stools. Also noted to have external hemorrhoids which are bothering her with some bleeding and itching.  REVIEW OF SYSTEMS:    Review of Systems  Constitutional: Negative for chills and fever.  HENT: Negative for congestion and tinnitus.   Eyes: Negative for blurred vision and double vision.  Respiratory: Negative for cough, shortness of breath and wheezing.   Cardiovascular: Negative for chest pain, orthopnea and PND.  Gastrointestinal: Positive for diarrhea. Negative for abdominal pain, nausea and vomiting.  Genitourinary: Negative for dysuria and hematuria.  Neurological: Positive for weakness. Negative for dizziness, sensory change and focal weakness.  All other systems reviewed and are negative.   Nutrition: Soft diet Tolerating Diet: Yes but little. Tolerating PT: Await Eval.    DRUG ALLERGIES:   Allergies  Allergen Reactions  . Amoxicillin-Pot Clavulanate     REACTION: diarrhea  . Keflex [Cephalexin] Diarrhea  . Septra [Sulfamethoxazole-Trimethoprim] Nausea Only    VITALS:  Blood pressure (!) 147/74, pulse 98, temperature 97.7 F (36.5 C), temperature source Oral, resp. rate 18, height 5\' 3"  (1.6 m), weight 60.1 kg (132 lb 7.9 oz), last menstrual period 11/02/1980, SpO2 94 %.  PHYSICAL EXAMINATION:   Physical Exam  GENERAL:  83 y.o.-year-old patient lying in bed in no acute distress.  EYES: Pupils equal, round, reactive to light and accommodation. No scleral icterus. Extraocular muscles intact.  HEENT: Head atraumatic, normocephalic. Oropharynx and nasopharynx clear.  NECK:  Supple, no jugular venous distention. No thyroid enlargement, no tenderness.  LUNGS:  Normal breath sounds bilaterally, no wheezing, rales, rhonchi. No use of accessory muscles of respiration.  CARDIOVASCULAR: S1, S2 RRR, Tachycardic. No murmurs, rubs, or gallops.  ABDOMEN: Soft, nontender, nondistended. Bowel sounds present. No organomegaly or mass.  EXTREMITIES: No cyanosis, clubbing or edema b/l.    NEUROLOGIC: Cranial nerves II through XII are intact. No focal Motor or sensory deficits b/l. Globally weak. PSYCHIATRIC: The patient is alert and oriented x 2.  SKIN: No obvious rash, lesion, or ulcer.    LABORATORY PANEL:   CBC Recent Labs  Lab 10/16/17 0604  WBC 15.4*  HGB 11.7*  HCT 35.3  PLT 191   ------------------------------------------------------------------------------------------------------------------  Chemistries  Recent Labs  Lab 10/13/17 1151  10/16/17 0604 10/17/17 0519  NA 134*   < > 138  --   K 3.8   < > 3.3* 3.6  CL 98*   < > 107  --   CO2 29   < > 25  --   GLUCOSE 105*   < > 108*  --   BUN 29*   < > 18  --   CREATININE 0.89   < > 0.43*  --   CALCIUM 8.7*   < > 7.6*  --   MG  --    < >  --  1.8  AST 46*  --   --   --   ALT 18  --   --   --   ALKPHOS 55  --   --   --   BILITOT 1.1  --   --   --    < > = values in this interval not displayed.   ------------------------------------------------------------------------------------------------------------------  Cardiac Enzymes No results for input(s): TROPONINI in the last 168 hours. ------------------------------------------------------------------------------------------------------------------  RADIOLOGY:  No results found.   ASSESSMENT AND PLAN:   83 year old female with past medical history of hypertension, hyperlipidemia, history of colon cancer, osteoarthritis, depression, anxiety who presented to the hospital due to diarrhea secondary to C. difficile.  1. C. difficile colitis-this is the cause of patient's diarrhea. Patient's diarrhea is a little bit worse since  yesterday. -I will continue oral vancomycin, will add IV Flagyl.  2. SVT-patient developed some elevated heart rates overnight and was transferred to telemetry. EKG reveals sinus tachycardia with PACs. No evidence of atrial fibrillation. This is secondary to electrolyte abnormalities, dehydration.  -Continue telemetry, follow heart rate which is labile. Electrolytes have improved with supplementation. Continue to treat underlying cause.  3. Urinary tract infection-continue IV ceftriaxone -Urine cultures negative so far.  4. Hypokalemia/hypomagnesemia-secondary to diarrhea. -Improved with supplementation will continue to monitor.  5. History of gout-continue allopurinol.  6. Anxiety/depression-continue Xanax, Paxil.  7. Essential hypertension-continue metoprolol.  8. Neuropathy-continue gabapentin.  9. Hemorrhoids/rectal bleeding-this is secondary to her ongoing diarrhea and irritation. -We'll order some Proctofoam suppositories. Watch for bleeding.  All the records are reviewed and case discussed with Care Management/Social Worker. Management plans discussed with the patient, family and they are in agreement.  CODE STATUS: Full code  DVT Prophylaxis: Lovenox  TOTAL TIME TAKING CARE OF THIS PATIENT: 30 minutes.   POSSIBLE D/C IN 2-3 DAYS, DEPENDING ON CLINICAL CONDITION.   Henreitta Leber M.D on 10/17/2017 at 12:25 PM  Between 7am to 6pm - Pager - [phone]  After 6pm go to [email or web address] - [employer] [facility]  Office  [phone]  CC: Primary care physician; Tower, [name], MD

## 2017-10-17 NOTE — Progress Notes (Signed)
SUBJECTIVE: Patient is feeling much better denies any chest pain or shortness of breath   Vitals:   10/16/17 1950 10/16/17 2006 10/17/17 0313 10/17/17 0842  BP: (!) 157/84 (!) 148/82 (!) 149/90 (!) 147/74  Pulse: (!) 115 98 (!) 108 98  Resp: 18 17 18 18   Temp: 98 F (36.7 C) 97.6 F (36.4 C) 97.7 F (36.5 C) 97.7 F (36.5 C)  TempSrc:  Oral Oral Oral  SpO2: 95% 95% 95% 94%  Weight:      Height:        Intake/Output Summary (Last 24 hours) at 10/17/2017 1243 Last data filed at 10/17/2017 1010 Gross per 24 hour  Intake 600 ml  Output 600 ml  Net 0 ml    LABS: Basic Metabolic Panel: Recent Labs    10/16/17 0556 10/16/17 0604 10/17/17 0519  NA  --  138  --   K  --  3.3* 3.6  CL  --  107  --   CO2  --  25  --   GLUCOSE  --  108*  --   BUN  --  18  --   CREATININE  --  0.43*  --   CALCIUM  --  7.6*  --   MG 1.5*  --  1.8   Liver Function Tests: No results for input(s): AST, ALT, ALKPHOS, BILITOT, PROT, ALBUMIN in the last 72 hours. No results for input(s): LIPASE, AMYLASE in the last 72 hours. CBC: Recent Labs    10/16/17 0604  WBC 15.4*  HGB 11.7*  HCT 35.3  MCV 98.5  PLT 191   Cardiac Enzymes: No results for input(s): CKTOTAL, CKMB, CKMBINDEX, TROPONINI in the last 72 hours. BNP: Invalid input(s): POCBNP D-Dimer: No results for input(s): DDIMER in the last 72 hours. Hemoglobin A1C: No results for input(s): HGBA1C in the last 72 hours. Fasting Lipid Panel: No results for input(s): CHOL, HDL, LDLCALC, TRIG, CHOLHDL, LDLDIRECT in the last 72 hours. Thyroid Function Tests: No results for input(s): TSH, T4TOTAL, T3FREE, THYROIDAB in the last 72 hours.  Invalid input(s): FREET3 Anemia Panel: No results for input(s): VITAMINB12, FOLATE, FERRITIN, TIBC, IRON, RETICCTPCT in the last 72 hours.   PHYSICAL EXAM General: Well developed, well nourished, in no acute distress HEENT:  Normocephalic and atramatic Neck:  No JVD.  Lungs: Clear bilaterally to  auscultation and percussion. Heart: HRRR . Normal S1 and S2 without gallops or murmurs.  Abdomen: Bowel sounds are positive, abdomen soft and non-tender  Msk:  Back normal, normal gait. Normal strength and tone for age. Extremities: No clubbing, cyanosis or edema.   Neuro: Alert and oriented X 3. Psych:  Good affect, responds appropriately  TELEMETRY: Sinus rhythm  ASSESSMENT AND PLAN: Sinus tachycardia with frequent atrial premature contractions associated with C. difficile colitis and possible sepsis. Advise treating underlying cause.  Active Problems:   C. difficile colitis    Dionisio David, MD, [facility] 10/17/2017 12:43 PM

## 2017-10-18 LAB — CBC
HCT: 36.7 % (ref 35.0–47.0)
Hemoglobin: 12.1 g/dL (ref 12.0–16.0)
MCH: 32.1 pg (ref 26.0–34.0)
MCHC: 33.1 g/dL (ref 32.0–36.0)
MCV: 97 fL (ref 80.0–100.0)
Platelets: 186 10*3/uL (ref 150–440)
RBC: 3.78 MIL/uL — ABNORMAL LOW (ref 3.80–5.20)
RDW: 13.8 % (ref 11.5–14.5)
WBC: 8.5 10*3/uL (ref 3.6–11.0)

## 2017-10-18 LAB — BASIC METABOLIC PANEL
Anion gap: 4 — ABNORMAL LOW (ref 5–15)
BUN: 12 mg/dL (ref 6–20)
CO2: 28 mmol/L (ref 22–32)
Calcium: 8 mg/dL — ABNORMAL LOW (ref 8.9–10.3)
Chloride: 106 mmol/L (ref 101–111)
Creatinine, Ser: 0.51 mg/dL (ref 0.44–1.00)
GFR calc Af Amer: >60 mL/min (ref >60)
GFR calc non Af Amer: >60 mL/min (ref >60)
Glucose, Bld: 101 mg/dL — ABNORMAL HIGH (ref 65–99)
Potassium: 3.9 mmol/L (ref 3.5–5.1)
Sodium: 138 mmol/L (ref 135–145)

## 2017-10-18 LAB — CULTURE, BLOOD (ROUTINE X 2)
Culture: NO GROWTH
Culture: NO GROWTH
Special Requests: ADEQUATE
Special Requests: ADEQUATE

## 2017-10-18 MED ORDER — MAGNESIUM SULFATE 2 GM/50ML IV SOLN
2.0000 g | Freq: Once | INTRAVENOUS | Status: AC
  Administered 2017-10-18: 2 g via INTRAVENOUS
  Filled 2017-10-18: qty 50

## 2017-10-18 MED ORDER — HYDROCODONE-ACETAMINOPHEN 5-325 MG PO TABS: 1.0000 | ORAL_TABLET | Freq: Three times a day (TID) | ORAL | Status: DC

## 2017-10-18 MED ORDER — HYDROCODONE-ACETAMINOPHEN 10-325 MG PO TABS
1.0000 | ORAL_TABLET | ORAL | Status: DC | PRN
  Administered 2017-10-18 – 2017-10-19 (×6): 1 via ORAL
  Filled 2017-10-18 (×7): qty 1

## 2017-10-18 NOTE — Progress Notes (Signed)
[place] at [place] NAME: Vicie Cech    MR#:  [id number]  DATE OF BIRTH:  Jan 17, 1923  SUBJECTIVE:   Patient here due to C. difficile colitis/diarrhea and also noted to have a urinary tract infection.   Diarrhea improved since yesterday.  Intermittently tachycardic.  Sitting in chair and upset as she is cold and was incontinent this a.m.   REVIEW OF SYSTEMS:    Review of Systems  Constitutional: Negative for chills and fever.  HENT: Negative for congestion and tinnitus.   Eyes: Negative for blurred vision and double vision.  Respiratory: Negative for cough, shortness of breath and wheezing.   Cardiovascular: Negative for chest pain, orthopnea and PND.  Gastrointestinal: Positive for diarrhea. Negative for abdominal pain, nausea and vomiting.  Genitourinary: Negative for dysuria and hematuria.  Neurological: Positive for weakness. Negative for dizziness, sensory change and focal weakness.  All other systems reviewed and are negative.   Nutrition: Soft diet Tolerating Diet: Yes but little. Tolerating PT: Eval noted.    DRUG ALLERGIES:   Allergies  Allergen Reactions  . Amoxicillin-Pot Clavulanate     REACTION: diarrhea  . Keflex [Cephalexin] Diarrhea  . Septra [Sulfamethoxazole-Trimethoprim] Nausea Only    VITALS:  Blood pressure (!) 138/99, pulse (!) 110, temperature 98 F (36.7 C), temperature source Oral, resp. rate 18, height 5\' 3"  (1.6 m), weight 60.1 kg (132 lb 7.9 oz), last menstrual period 11/02/1980, SpO2 96 %.  PHYSICAL EXAMINATION:   Physical Exam  GENERAL:  83 y.o.-year-old patient lying in bed in no acute distress.  EYES: Pupils equal, round, reactive to light and accommodation. No scleral icterus. Extraocular muscles intact.  HEENT: Head atraumatic, normocephalic. Oropharynx and nasopharynx clear.  NECK:  Supple, no jugular venous distention. No thyroid enlargement, no tenderness.  LUNGS: Normal breath sounds  bilaterally, no wheezing, rales, rhonchi. No use of accessory muscles of respiration.  CARDIOVASCULAR: S1, S2 RRR, Tachycardic. No murmurs, rubs, or gallops.  ABDOMEN: Soft, nontender, nondistended. Bowel sounds present. No organomegaly or mass.  EXTREMITIES: No cyanosis, clubbing or edema b/l.    NEUROLOGIC: Cranial nerves II through XII are intact. No focal Motor or sensory deficits b/l. Globally weak. PSYCHIATRIC: The patient is alert and oriented x 2.  SKIN: No obvious rash, lesion, or ulcer.    LABORATORY PANEL:   CBC Recent Labs  Lab 10/18/17 0557  WBC 8.5  HGB 12.1  HCT 36.7  PLT 186   ------------------------------------------------------------------------------------------------------------------  Chemistries  Recent Labs  Lab 10/13/17 1151  10/17/17 0519 10/18/17 0557  NA 134*   < >  --  138  K 3.8   < > 3.6 3.9  CL 98*   < >  --  106  CO2 29   < >  --  28  GLUCOSE 105*   < >  --  101*  BUN 29*   < >  --  12  CREATININE 0.89   < >  --  0.51  CALCIUM 8.7*   < >  --  8.0*  MG  --    < > 1.8  --   AST 46*  --   --   --   ALT 18  --   --   --   ALKPHOS 55  --   --   --   BILITOT 1.1  --   --   --    < > = values in this interval not displayed.   ------------------------------------------------------------------------------------------------------------------  Cardiac Enzymes No results for input(s): TROPONINI in the last 168 hours. ------------------------------------------------------------------------------------------------------------------  RADIOLOGY:  No results found.   ASSESSMENT AND PLAN:   83 year old female with past medical history of hypertension, hyperlipidemia, history of colon cancer, osteoarthritis, depression, anxiety who presented to the hospital due to diarrhea secondary to C. difficile.  1. C. difficile colitis-this is the cause of patient's diarrhea. Diarrhea a bit improved since yesterday.  - cont. Flagyl IV and Oral Vanc for  now.  2. SVT-patient developed some elevated heart rates and was transferred to telemetry 2 days ago. EKG reveals sinus tachycardia with PACs. No evidence of atrial fibrillation. This is secondary to electrolyte abnormalities, dehydration.  -Continue telemetry, follow heart rate which is labile. Electrolytes improved with supplementation. Continue to treat underlying cause and cont. Low Dose Metoprolol.   3. Urinary tract infection- treated. Urine cultures are (-).  Finished 3 days of IV Ceftriaxone.   4. Hypokalemia/hypomagnesemia-secondary to diarrhea. -Improved with supplementation will continue to monitor.  5. History of gout-continue allopurinol.  6. Anxiety/depression-continue Xanax, Paxil.  7. Essential hypertension-continue metoprolol.  8. Neuropathy-continue gabapentin.  9. Hemorrhoids/rectal bleeding-this is secondary to her ongoing diarrhea and irritation. - cont. Anusol suppositories and supportive care.   PT eval noted and will need SNF upon discharge.   All the records are reviewed and case discussed with Care Management/Social Worker. Management plans discussed with the patient, family and they are in agreement.  CODE STATUS: Full code  DVT Prophylaxis: Lovenox  TOTAL TIME TAKING CARE OF THIS PATIENT: 30 minutes.   POSSIBLE D/C IN 2-3 DAYS, DEPENDING ON CLINICAL CONDITION.   Henreitta Leber M.D on 10/18/2017 at 3:12 PM  Between 7am to 6pm - Pager - [phone]  After 6pm go to [email or web address] - [employer] [facility]  Office  [phone]  CC: Primary care physician; Tower, [name], MD

## 2017-10-18 NOTE — Evaluation (Signed)
Physical Therapy Evaluation Patient Details Name: Michelle Gregory MRN: [id number] DOB: 10-04-1923 Today's Date: 10/18/2017   History of Present Illness  presented to ER secondary to abdominal pain, diarrhea; admitted with sepsis secondary to c-diff colitis.  Hospital stay significant for elevated HR/SVT related to electrolyte abnormality, dehydration  Clinical Impression  Upon evaluation, patient alert and oriented; follows all commands, but demonstrates mild deficits in STM.  Bilat UE/LE generally weak and deconditioned due to acute illness, but functional for basic transfers and mobility.  Currently requiring min assist for bed mobility, sit/stand, basic transfers and very short-distance gait (5') with RW.  Generally unsteady with consistent cuing for safety awareness, walker position/management and fall prevention/balance correction.  Notably fatigued with very minimal activity; unable to tolerate additional activity/gait distance as result. Unsafe/unable to return home alone at this time. HR stable in low-100s throughout session, sats stable and WFL on RA. Would benefit from skilled PT to address above deficits and promote optimal return to PLOF; recommend transition to STR upon discharge from acute hospitalization.     Follow Up Recommendations SNF    Equipment Recommendations       Recommendations for Other Services       Precautions / Restrictions Precautions Precautions: Fall Restrictions Weight Bearing Restrictions: No      Mobility  Bed Mobility Overal bed mobility: Needs Assistance Bed Mobility: Supine to Sit     Supine to sit: Min assist        Transfers Overall transfer level: Needs assistance Equipment used: Rolling walker (2 wheeled) Transfers: Sit to/from Stand Sit to Stand: Min assist         General transfer comment: cuing for hand placement, assist for lift off and standing balance  Ambulation/Gait Ambulation/Gait assistance: Min  assist Ambulation Distance (Feet): 5 Feet Assistive device: Rolling walker (2 wheeled)       General Gait Details: forward flexed posture, slow, effortful steps; consistent cuing for walker placement/management, attempting to sit prior to full alignment with seating surface  Stairs            Wheelchair Mobility    Modified Rankin (Stroke Patients Only)       Balance Overall balance assessment: Needs assistance Sitting-balance support: No upper extremity supported;Feet supported Sitting balance-Leahy Scale: Good     Standing balance support: Bilateral upper extremity supported Standing balance-Leahy Scale: Poor                               Pertinent Vitals/Pain Pain Assessment: Faces Faces Pain Scale: Hurts even more Pain Location: back Pain Descriptors / Indicators: Aching;Dull;Grimacing Pain Intervention(s): Limited activity within patient's tolerance;Monitored during session;Repositioned;Premedicated before session    Home Living Family/patient expects to be discharged to:: Private residence Living Arrangements: Alone Available Help at Discharge: Family Type of Home: House Home Access: Stairs to enter Entrance Stairs-Rails: Right;Left;Can reach both Technical brewer of Steps: 5 Home Layout: Two level        Prior Function Level of Independence: Independent         Comments: Indep with ADLs, household activities without assist device; sponge bathes primarily, but son/daughter in law assist with full bath as needed.  Does endorse multiple falls in recent weeks (attributed to current illness)     Hand Dominance        Extremity/Trunk Assessment   Upper Extremity Assessment Upper Extremity Assessment: Generalized weakness(globally weak, at least 4-/5; increased time for functional tasks/ADLs)  Lower Extremity Assessment Lower Extremity Assessment: Generalized weakness(grossly at leat 4-/5 throughout)       Communication    Communication: HOH  Cognition Arousal/Alertness: Awake/alert Behavior During Therapy: WFL for tasks assessed/performed Overall Cognitive Status: Within Functional Limits for tasks assessed                                        General Comments      Exercises Other Exercises Other Exercises: Set up/assisted with washing face, oral care in bed, supervision assist.  R hand dominant, requiring increased time for activation/use of UE due to weakness   Assessment/Plan    PT Assessment Patient needs continued PT services  PT Problem List Decreased strength;Decreased balance;Decreased activity tolerance;Decreased mobility;Decreased knowledge of use of DME;Decreased coordination;Decreased safety awareness;Decreased knowledge of precautions;Cardiopulmonary status limiting activity;Pain       PT Treatment Interventions DME instruction;Gait training;Stair training;Functional mobility training;Therapeutic activities;Therapeutic exercise;Balance training;Patient/family education    PT Goals (Current goals can be found in the Care Plan section)  Acute Rehab PT Goals Patient Stated Goal: to keep legs strong  PT Goal Formulation: With patient Time For Goal Achievement: 11/01/17 Potential to Achieve Goals: Good    Frequency Min 2X/week   Barriers to discharge Decreased caregiver support      Co-evaluation               AM-PAC PT "6 Clicks" Daily Activity  Outcome Measure Difficulty turning over in bed (including adjusting bedclothes, sheets and blankets)?: Unable Difficulty moving from lying on back to sitting on the side of the bed? : Unable Difficulty sitting down on and standing up from a chair with arms (e.g., wheelchair, bedside commode, etc,.)?: Unable Help needed moving to and from a bed to chair (including a wheelchair)?: A Little Help needed walking in hospital room?: A Little Help needed climbing 3-5 steps with a railing? : A Lot 6 Click Score: 11     End of Session Equipment Utilized During Treatment: Gait belt Activity Tolerance: Patient tolerated treatment well Patient left: in chair;with call bell/phone within reach;with chair alarm set Nurse Communication: Mobility status PT Visit Diagnosis: Muscle weakness (generalized) (M62.81);Difficulty in walking, not elsewhere classified (R26.2);History of falling (Z91.81)    Time: 3762-8315 PT Time Calculation (min) (ACUTE ONLY): 31 min   Charges:   PT Evaluation $PT Eval Low Complexity: 1 Low PT Treatments $Therapeutic Activity: 8-22 mins   PT G Codes:   PT G-Codes **NOT FOR INPATIENT CLASS** Functional Assessment Tool Used: AM-PAC 6 Clicks Basic Mobility Functional Limitation: Mobility: Walking and moving around Mobility: Walking and Moving Around Current Status (V7616): At least 40 percent but less than 60 percent impaired, limited or restricted Mobility: Walking and Moving Around Goal Status [phone]): At least 1 percent but less than 20 percent impaired, limited or restricted    December Hedtke H. Owens Shark, PT, DPT, NCS 10/18/17, 1:30 PM [phone]

## 2017-10-18 NOTE — Plan of Care (Signed)
  Progressing Education: Knowledge of General Education information will improve 10/18/2017 1900 - Progressing by Darrelyn Hillock, RN Health Behavior/Discharge Planning: Ability to manage health-related needs will improve 10/18/2017 1900 - Progressing by Darrelyn Hillock, RN Clinical Measurements: Ability to maintain clinical measurements within normal limits will improve 10/18/2017 1900 - Progressing by Darrelyn Hillock, RN Will remain free from infection 10/18/2017 1900 - Progressing by Darrelyn Hillock, RN Diagnostic test results will improve 10/18/2017 1900 - Progressing by Darrelyn Hillock, RN Respiratory complications will improve 10/18/2017 1900 - Progressing by Darrelyn Hillock, RN Cardiovascular complication will be avoided 10/18/2017 1900 - Progressing by Darrelyn Hillock, RN Activity: Risk for activity intolerance will decrease 10/18/2017 1900 - Progressing by Darrelyn Hillock, RN Nutrition: Adequate nutrition will be maintained 10/18/2017 1900 - Progressing by Darrelyn Hillock, RN Coping: Level of anxiety will decrease 10/18/2017 1900 - Progressing by Darrelyn Hillock, RN Elimination: Will not experience complications related to bowel motility 10/18/2017 1900 - Progressing by Darrelyn Hillock, RN Will not experience complications related to urinary retention 10/18/2017 1900 - Progressing by Darrelyn Hillock, RN Pain Managment: General experience of comfort will improve 10/18/2017 1900 - Progressing by Darrelyn Hillock, RN Safety: Ability to remain free from injury will improve 10/18/2017 1900 - Progressing by Darrelyn Hillock, RN Skin Integrity: Risk for impaired skin integrity will decrease 10/18/2017 1900 - Progressing by Darrelyn Hillock, RN

## 2017-10-18 NOTE — Progress Notes (Signed)
Pt.s daughter called for an update on pt. She also explained pt. Takes hydrocodone scheduled at home. 9,1,5 and 10. She also takes xanax scheduled at HS.  I explained that pt. Was sleeping at this time but I would ask her if she was in any pain and medicate her accordingly.

## 2017-10-18 NOTE — Progress Notes (Signed)
Pt. Slept well through the night with c/o pain x1 with effective results. Bowel movements have decreased from last night. HR continues to be ST in low 100's to min teens.

## 2017-10-19 LAB — BASIC METABOLIC PANEL
Anion gap: 6 (ref 5–15)
BUN: 9 mg/dL (ref 6–20)
CO2: 27 mmol/L (ref 22–32)
Calcium: 7.9 mg/dL — ABNORMAL LOW (ref 8.9–10.3)
Chloride: 103 mmol/L (ref 101–111)
Creatinine, Ser: 0.48 mg/dL (ref 0.44–1.00)
GFR calc Af Amer: >60 mL/min (ref >60)
GFR calc non Af Amer: >60 mL/min (ref >60)
Glucose, Bld: 107 mg/dL — ABNORMAL HIGH (ref 65–99)
Potassium: 3.8 mmol/L (ref 3.5–5.1)
Sodium: 136 mmol/L (ref 135–145)

## 2017-10-19 LAB — MAGNESIUM: Magnesium: 1.4 mg/dL — ABNORMAL LOW (ref 1.7–2.4)

## 2017-10-19 MED ORDER — MAGNESIUM SULFATE 4 GM/100ML IV SOLN
4.0000 g | Freq: Once | INTRAVENOUS | Status: AC
  Administered 2017-10-19: 4 g via INTRAVENOUS
  Filled 2017-10-19: qty 100

## 2017-10-19 MED ORDER — LOSARTAN POTASSIUM 50 MG PO TABS
100.0000 mg | ORAL_TABLET | Freq: Every day | ORAL | Status: DC
  Administered 2017-10-19 – 2017-10-20 (×2): 100 mg via ORAL
  Filled 2017-10-19 (×2): qty 2

## 2017-10-19 MED ORDER — METOPROLOL TARTRATE 50 MG PO TABS
50.0000 mg | ORAL_TABLET | Freq: Two times a day (BID) | ORAL | Status: DC
  Administered 2017-10-19 (×2): 50 mg via ORAL
  Filled 2017-10-19 (×2): qty 1

## 2017-10-19 NOTE — NC FL2 (Signed)
[place] LEVEL OF CARE SCREENING TOOL     IDENTIFICATION  Patient Name: Michelle Gregory Birthdate: 1923-05-10 Sex: female Admission Date (Current Location): 10/13/2017  [place] and [place] Number:  Engineering geologist and Address:  [facility], [address] [place], [place] [address]      Provider Number: [id number]  Attending Physician Name and Address:  Henreitta Leber, MD  Relative Name and Phone Number:  Coble,[name] Daughter [phone] or Posey Pronto [phone]      Current Level of Care: Hospital Recommended Level of Care: [place] Prior Approval Number:    Date Approved/Denied:   PASRR Number: [id number]  Discharge Plan: SNF    Current Diagnoses: Patient Active Problem List   Diagnosis Date Noted  . C. difficile colitis 10/13/2017  . Maternal UTI (urinary tract infection), recurrent 09/06/2017  . Recurrent UTI (urinary tract infection) 09/06/2017  . Complicated UTI (urinary tract infection) 08/12/2017  . Auditory hallucinations 03/03/2017  . Weight loss 03/03/2017  . Female cystocele 04/13/2016  . Urinary urgency 02/17/2016  . Bowel habit changes 12/30/2015  . Compression fracture 12/30/2015  . Excessive cerumen in right ear canal 05/27/2015  . Ingrown toenail without infection 05/04/2015  . Routine general medical examination at a health care facility 04/09/2015  . Candidal intertrigo 03/28/2014  . Excessive cerumen in both ear canals 01/17/2014  . Encounter for Medicare annual wellness exam 03/21/2013  . Gout 09/19/2012  . Osteopenia 05/05/2011  . Degenerative lumbar disc 01/20/2011  . VARICOSE VEINS, LOWER EXTREMITIES 09/04/2010  . Hyperglycemia 01/08/2010  . Hyperlipidemia 05/09/2008  . GOUT 05/09/2008  . Anxiety state 05/09/2008  . Essential hypertension 05/09/2008  . HEMORRHOIDS, INTERNAL 05/09/2008  . Allergic rhinitis 05/09/2008  . Mild reactive airways  disease 05/09/2008  . GERD 05/09/2008  . IBS 05/09/2008  . OSTEOARTHRITIS 05/09/2008  . History of malignant neoplasm of large intestine 05/09/2008    Orientation RESPIRATION BLADDER Height & Weight     Self, Place  Normal Incontinent Weight: 132 lb 7.9 oz (60.1 kg) Height:  5\' 3"  (160 cm)  BEHAVIORAL SYMPTOMS/MOOD NEUROLOGICAL BOWEL NUTRITION STATUS      Continent Diet(Soft diet)  AMBULATORY STATUS COMMUNICATION OF NEEDS Skin   Limited Assist Verbally Normal                       Personal Care Assistance Level of Assistance  Bathing, Feeding, Dressing Bathing Assistance: Limited assistance Feeding assistance: Limited assistance Dressing Assistance: Limited assistance     Functional Limitations Info  Sight, Hearing, Speech Sight Info: Adequate Hearing Info: Adequate Speech Info: Adequate    SPECIAL CARE FACTORS FREQUENCY  PT (By licensed PT)     PT Frequency: 5x a week              Contractures Contractures Info: Not present    Additional Factors Info  Code Status, Allergies, Psychotropic, Isolation Precautions Code Status Info: Full Code Allergies Info: AMOXICILLIN-POT CLAVULANATE, KEFLEX CEPHALEXIN, SEPTRA SULFAMETHOXAZOLE-TRIMETHOPRIM  Psychotropic Info: PARoxetine (PAXIL) tablet 30 mg    Isolation Precautions Info: Enteric precautions for C-Diff     Current Medications (10/19/2017):  This is the current hospital active medication list Current Facility-Administered Medications  Medication Dose Route Frequency Provider Last Rate Last Dose  . acetaminophen (TYLENOL) tablet 650 mg  650 mg Oral Q6H PRN Demetrios Loll, MD   650 mg at 10/17/17 1245   Or  . acetaminophen (TYLENOL) suppository 650 mg  650 mg Rectal Q6H PRN Demetrios Loll, MD      . albuterol (PROVENTIL) (2.5 MG/3ML) 0.083% nebulizer solution 2.5 mg  2.5 mg Nebulization Q2H PRN Demetrios Loll, MD   2.5 mg at 10/16/17 5462  . allopurinol (ZYLOPRIM) tablet 200 mg  200 mg Oral Daily Demetrios Loll, MD   200 mg  at 10/19/17 0944  . ALPRAZolam Duanne Moron) tablet 0.25 mg  0.25 mg Oral BID PRN Demetrios Loll, MD   0.25 mg at 10/17/17 0044  . aspirin EC tablet 81 mg  81 mg Oral Daily Demetrios Loll, MD   81 mg at 10/19/17 0946  . enoxaparin (LOVENOX) injection 40 mg  40 mg Subcutaneous Q24H Demetrios Loll, MD   40 mg at 10/18/17 2157  . gabapentin (NEURONTIN) capsule 300 mg  300 mg Oral BID Demetrios Loll, MD   300 mg at 10/19/17 0945  . HYDROcodone-acetaminophen (NORCO) 10-325 MG per tablet 1 tablet  1 tablet Oral Q4H PRN Henreitta Leber, MD   1 tablet at 10/19/17 1339  . hydrocortisone (ANUSOL-HC) suppository 25 mg  25 mg Rectal BID Henreitta Leber, MD   25 mg at 10/19/17 0944  . losartan (COZAAR) tablet 100 mg  100 mg Oral Daily Henreitta Leber, MD   100 mg at 10/19/17 1339  . magnesium sulfate IVPB 4 g 100 mL  4 g Intravenous Once Henreitta Leber, MD 50 mL/hr at 10/19/17 1340 4 g at 10/19/17 1340  . meclizine (ANTIVERT) tablet 25 mg  25 mg Oral TID PRN Demetrios Loll, MD   25 mg at 10/17/17 0959  . MEDLINE mouth rinse  15 mL Mouth Rinse BID Henreitta Leber, MD   15 mL at 10/19/17 0946  . metoprolol tartrate (LOPRESSOR) injection 5 mg  5 mg Intravenous Q6H PRN Lance Coon, MD   5 mg at 10/19/17 0347  . metoprolol tartrate (LOPRESSOR) tablet 50 mg  50 mg Oral BID Neoma Laming A, MD   50 mg at 10/19/17 0946  . metroNIDAZOLE (FLAGYL) IVPB 500 mg  500 mg Intravenous Q8H Henreitta Leber, MD   Stopped at 10/19/17 1104  . ondansetron (ZOFRAN) tablet 4 mg  4 mg Oral Q6H PRN Demetrios Loll, MD       Or  . ondansetron [facility]) injection 4 mg  4 mg Intravenous Q6H PRN Demetrios Loll, MD   4 mg at 10/19/17 0529  . PARoxetine (PAXIL) tablet 30 mg  30 mg Oral q morning - 10a Demetrios Loll, MD   30 mg at 10/19/17 0945  . raloxifene (EVISTA) tablet 60 mg  60 mg Oral Daily Demetrios Loll, MD   60 mg at 10/19/17 0944  . sodium chloride 0.9 % bolus 1,000 mL  1,000 mL Intravenous PRN Harrie Foreman, MD   Stopped at 10/16/17 [phone]  . vancomycin  (VANCOCIN) 50 mg/mL oral solution 125 mg  125 mg Oral QID Lisa Roca, MD   125 mg at 10/19/17 0944  . witch hazel-glycerin (TUCKS) pad   Topical PRN Henreitta Leber, MD         Discharge Medications: Please see discharge summary for a list of discharge medications.  Relevant Imaging Results:  Relevant Lab Results:   Additional Information SSN [id number]  Ross Ludwig, [place]

## 2017-10-19 NOTE — Clinical Social Work Note (Signed)
CSW presented bed offers and patient's daughter chose [facility] [facility] [employer]  CSW continuing to follow patient's progress throughout discharge planning.  Jones Broom. [place], MSW, [place]  10/19/2017 5:15 PM

## 2017-10-19 NOTE — Progress Notes (Signed)
Physical Therapy Treatment Patient Details Name: Michelle Gregory MRN: [id number] DOB: Nov 17, 1922 Today's Date: 10/19/2017    History of Present Illness presented to ER secondary to abdominal pain, diarrhea; admitted with sepsis secondary to c-diff colitis.  Hospital stay significant for elevated HR/SVT related to electrolyte abnormality, dehydration    PT Comments    Patient agreeable to session with mod encouragement from both patient and daughter.  Completes all mobility with min assist, but notably fatigued with very minimal exertion (HR stable in 110s); unable to tolerate progression of gait distance as result.  Encouraged for OOB with all meals to promote improved tolerance/endurance for OOB activities.  Patient/daughter voiced understanding.    Follow Up Recommendations  SNF     Equipment Recommendations       Recommendations for Other Services       Precautions / Restrictions Precautions Precautions: Fall Precaution Comments: enteric iso Restrictions Weight Bearing Restrictions: No    Mobility  Bed Mobility Overal bed mobility: Needs Assistance Bed Mobility: Supine to Sit     Supine to sit: Min guard     General bed mobility comments: increased time, heavy use of bedrails required  Transfers Overall transfer level: Needs assistance Equipment used: Rolling walker (2 wheeled) Transfers: Sit to/from Stand Sit to Stand: Min assist         General transfer comment: cuing for hand placement, assist for lift off and standing balance  Ambulation/Gait Ambulation/Gait assistance: Min assist           General Gait Details: forward flexed posture, slow, effortful steps; improved walker management and overall safety.  Notably fatigued with minimal mobility; declines additional gait/mobility efforts.   Stairs            Wheelchair Mobility    Modified Rankin (Stroke Patients Only)       Balance   Sitting-balance support: No upper extremity  supported;Feet supported Sitting balance-Leahy Scale: Good     Standing balance support: Bilateral upper extremity supported Standing balance-Leahy Scale: Poor                              Cognition Arousal/Alertness: Awake/alert Behavior During Therapy: WFL for tasks assessed/performed Overall Cognitive Status: Within Functional Limits for tasks assessed                                 General Comments: mild memory deficits      Exercises Other Exercises Other Exercises: Seated LE therex, 1x10, AROM for muscular strength/endurance with functional activities: ankle pumps, LAQs, marching.  Cuing for slow, controlled movement throughout available range.  Encouarged repetition 1-2x/day outside of therapy as HEP. Patient/daughter voiced understanding.    General Comments        Pertinent Vitals/Pain Pain Assessment: Faces Faces Pain Scale: Hurts little more Pain Descriptors / Indicators: Aching Pain Intervention(s): Monitored during session;Repositioned;Limited activity within patient's tolerance    Home Living                      Prior Function            PT Goals (current goals can now be found in the care plan section) Acute Rehab PT Goals Patient Stated Goal: to keep legs strong  PT Goal Formulation: With patient Time For Goal Achievement: 11/01/17 Potential to Achieve Goals: Good Progress towards PT goals: Progressing toward goals  Frequency    Min 2X/week      PT Plan Current plan remains appropriate    Co-evaluation              AM-PAC PT "6 Clicks" Daily Activity  Outcome Measure  Difficulty turning over in bed (including adjusting bedclothes, sheets and blankets)?: Unable Difficulty moving from lying on back to sitting on the side of the bed? : Unable Difficulty sitting down on and standing up from a chair with arms (e.g., wheelchair, bedside commode, etc,.)?: Unable Help needed moving to and from a bed to  chair (including a wheelchair)?: A Little Help needed walking in hospital room?: A Little Help needed climbing 3-5 steps with a railing? : A Lot 6 Click Score: 11    End of Session Equipment Utilized During Treatment: Gait belt Activity Tolerance: Patient tolerated treatment well Patient left: in chair;with call bell/phone within reach;with chair alarm set;with family/visitor present Nurse Communication: Mobility status PT Visit Diagnosis: Muscle weakness (generalized) (M62.81);Difficulty in walking, not elsewhere classified (R26.2);History of falling (Z91.81)     Time: 7902-4097 PT Time Calculation (min) (ACUTE ONLY): 23 min  Charges:  $Therapeutic Exercise: 8-22 mins $Therapeutic Activity: 8-22 mins                    G Codes:      [place] Calia H. Owens Shark, PT, DPT, NCS 10/19/17, 11:45 AM [phone]

## 2017-10-19 NOTE — Progress Notes (Signed)
[facility] NAME: Michelle Gregory    MR#:  [id number]  DATE OF BIRTH:  04-16-1923  SUBJECTIVE:   Diarrhea improving. No N/V or any other complaints. HR stable.    REVIEW OF SYSTEMS:    Review of Systems  Constitutional: Negative for chills and fever.  HENT: Negative for congestion and tinnitus.   Eyes: Negative for blurred vision and double vision.  Respiratory: Negative for cough, shortness of breath and wheezing.   Cardiovascular: Negative for chest pain, orthopnea and PND.  Gastrointestinal: Positive for diarrhea. Negative for abdominal pain, nausea and vomiting.  Genitourinary: Negative for dysuria and hematuria.  Neurological: Positive for weakness. Negative for dizziness, sensory change and focal weakness.  All other systems reviewed and are negative.   Nutrition: Soft diet Tolerating Diet: Yes but little. Tolerating PT: Eval noted.    DRUG ALLERGIES:   Allergies  Allergen Reactions  . Amoxicillin-Pot Clavulanate     REACTION: diarrhea  . Keflex [Cephalexin] Diarrhea  . Septra [Sulfamethoxazole-Trimethoprim] Nausea Only    VITALS:  Blood pressure (!) 137/92, pulse 99, temperature (!) 97.4 F (36.3 C), temperature source Oral, resp. rate 14, height 5\' 3"  (1.6 m), weight 60.1 kg (132 lb 7.9 oz), last menstrual period 11/02/1980, SpO2 94 %.  PHYSICAL EXAMINATION:   Physical Exam  GENERAL:  83 y.o.-year-old patient lying in bed in no acute distress.  EYES: Pupils equal, round, reactive to light and accommodation. No scleral icterus. Extraocular muscles intact.  HEENT: Head atraumatic, normocephalic. Oropharynx and nasopharynx clear.  NECK:  Supple, no jugular venous distention. No thyroid enlargement, no tenderness.  LUNGS: Normal breath sounds bilaterally, no wheezing, rales, rhonchi. No use of accessory muscles of respiration.  CARDIOVASCULAR: S1, S2 RRR, Tachycardic. No murmurs, rubs, or gallops.  ABDOMEN: Soft,  nontender, nondistended. Bowel sounds present. No organomegaly or mass.  EXTREMITIES: No cyanosis, clubbing or edema b/l.    NEUROLOGIC: Cranial nerves II through XII are intact. No focal Motor or sensory deficits b/l. Globally weak. PSYCHIATRIC: The patient is alert and oriented x 2.  SKIN: No obvious rash, lesion, or ulcer.    LABORATORY PANEL:   CBC Recent Labs  Lab 10/18/17 0557  WBC 8.5  HGB 12.1  HCT 36.7  PLT 186   ------------------------------------------------------------------------------------------------------------------  Chemistries  Recent Labs  Lab 10/13/17 1151  10/19/17 0359  NA 134*   < > 136  K 3.8   < > 3.8  CL 98*   < > 103  CO2 29   < > 27  GLUCOSE 105*   < > 107*  BUN 29*   < > 9  CREATININE 0.89   < > 0.48  CALCIUM 8.7*   < > 7.9*  MG  --    < > 1.4*  AST 46*  --   --   ALT 18  --   --   ALKPHOS 55  --   --   BILITOT 1.1  --   --    < > = values in this interval not displayed.   ------------------------------------------------------------------------------------------------------------------  Cardiac Enzymes No results for input(s): TROPONINI in the last 168 hours. ------------------------------------------------------------------------------------------------------------------  RADIOLOGY:  No results found.   ASSESSMENT AND PLAN:   83 year old female with past medical history of hypertension, hyperlipidemia, history of colon cancer, osteoarthritis, depression, anxiety who presented to the hospital due to diarrhea secondary to C. difficile.  1. C. difficile colitis-this is the cause of patient's diarrhea. Diarrhea  improving and No N/V or abdominal pain.  - cont. Flagyl IV and Oral Vanc for now.  2. SVT-patient developed some elevated heart rates and was transferred to telemetry 3 days ago. EKG reveals sinus tachycardia with PACs. No evidence of atrial fibrillation. This was secondary to electrolyte abnormalities, dehydration.  - HR  improved. Electrolytes improved with supplementation. Continue to treat underlying cause and cont. Low Dose Metoprolol.   3. Urinary tract infection- treated. Urine cultures are (-).  Finished 3 days of IV Ceftriaxone.   4. Hypokalemia/hypomagnesemia-secondary to diarrhea. Mg. Level was low again today.  Will give IV mg. And monitor.   5. History of gout-continue allopurinol.  6. Anxiety/depression-continue Xanax, Paxil.  7. Essential hypertension-continue metoprolol.  8. Neuropathy-continue gabapentin.  9. Hemorrhoids/rectal bleeding-this is secondary to her ongoing diarrhea and irritation. - cont. Anusol suppositories and supportive care.   PT eval noted and will need SNF upon discharge and possible d/c there tomorrow.   All the records are reviewed and case discussed with Care Management/Social Worker. Management plans discussed with the patient, family and they are in agreement.  CODE STATUS: Full code  DVT Prophylaxis: Lovenox  TOTAL TIME TAKING CARE OF THIS PATIENT: 30 minutes.   POSSIBLE D/C IN 2-3 DAYS, DEPENDING ON CLINICAL CONDITION.   Henreitta Leber M.D on 10/19/2017 at 5:16 PM  Between 7am to 6pm - Pager - [phone]  After 6pm go to [email or web address] - Technical brewer [facility]  Office  [phone]  CC: Primary care physician; Tower, [name], MD

## 2017-10-19 NOTE — Clinical Social Work Note (Signed)
Clinical Social Work Assessment  Patient Details  Name: Michelle Gregory MRN: [id number] Date of Birth: Mar 27, 1923  Date of referral:  10/19/17               Reason for consult:  Facility Placement                Permission sought to share information with:  Facility Sport and exercise psychologist, Family Supports Permission granted to share information::  Yes, Verbal Permission Granted  Name::     Coble,[name] Daughter [phone] or Posey Pronto [phone]   Agency::  SNF admissions  Relationship::     Contact Information:     Housing/Transportation Living arrangements for the past 2 months:  Single Family Home Source of Information:  Adult Children, Medical Team Patient Interpreter Needed:  None Criminal Activity/Legal Involvement Pertinent to Current Situation/Hospitalization:  No - Comment as needed Significant Relationships:  Adult Children Lives with:  Self Do you feel safe going back to the place where you live?  No Need for family participation in patient care:  Yes (Comment)  Care giving concerns:  Patient and her family feel she needs some short term rehab before she is able to return back home.   Social Worker assessment / plan:  Patient is a 83 year old female who is alert and oriented x2.  CSW spoke to patient's daughter to complete assessment due to patient's cognitive ability.  Patient currently lives alone, her daughter checks on herd regularly.  Patient spoke to her daughter and she would like to go to [facility] or [facility].  CSW explained to patient's daughter the process for tying to find placement and what to expect at SNF.  CSW explained how insurance will pay for stay and what the procedure is for having her discharge from the [facility] was given permission to begin bed search in [place] and [place].  Daughter prefers [facility], because it is closer to where she lives.  Employment status:  Retired Radiation protection practitioner:    PT Recommendations:  Research officer, trade union, [place] / Referral to community resources:  [place]  Patient/Family's Response to care:  Patient and family are agreeable to going to SNF for short term rehab.  Patient/Family's Understanding of and Emotional Response to Diagnosis, Current Treatment, and Prognosis:  Patient's family feel she will need some rehab, they are hopeful she oo                                                                                        Emotional Assessment Appearance:  Appears stated age Attitude/Demeanor/Rapport:  Irrational Affect (typically observed):  Calm, Stable Orientation:  Oriented to Self, Oriented to Place Alcohol / Substance use:  Not Applicable Psych involvement (Current and /or in the community):  No (Comment)  Discharge Needs  Concerns to be addressed:  Lack of Support Readmission within the last 30 days:  No Current discharge risk:  Cognitively Impaired, Lack of support system Barriers to Discharge:  Continued Medical Work up   Anell Barr 10/19/2017, 7:23 PM

## 2017-10-19 NOTE — Progress Notes (Signed)
SUBJECTIVE: Patient is feeling short of breath   Vitals:   10/18/17 2032 10/19/17 0342 10/19/17 0431 10/19/17 0836  BP: 126/84 (!) 183/93 (!) 162/90 (!) 151/84  Pulse: (!) 113 (!) 112 (!) 101 (!) 105  Resp: 16 18  (!) 22  Temp: 98.3 F (36.8 C) 98.6 F (37 C)  (!) 97.4 F (36.3 C)  TempSrc: Oral Oral  Oral  SpO2: 96% 95%  94%  Weight:      Height:       No intake or output data in the 24 hours ending 10/19/17 0909  LABS: Basic Metabolic Panel: Recent Labs    10/17/17 0519 10/18/17 0557 10/19/17 0359  NA  --  138 136  K 3.6 3.9 3.8  CL  --  106 103  CO2  --  28 27  GLUCOSE  --  101* 107*  BUN  --  12 9  CREATININE  --  0.51 0.48  CALCIUM  --  8.0* 7.9*  MG 1.8  --  1.4*   Liver Function Tests: No results for input(s): AST, ALT, ALKPHOS, BILITOT, PROT, ALBUMIN in the last 72 hours. No results for input(s): LIPASE, AMYLASE in the last 72 hours. CBC: Recent Labs    10/18/17 0557  WBC 8.5  HGB 12.1  HCT 36.7  MCV 97.0  PLT 186   Cardiac Enzymes: No results for input(s): CKTOTAL, CKMB, CKMBINDEX, TROPONINI in the last 72 hours. BNP: Invalid input(s): POCBNP D-Dimer: No results for input(s): DDIMER in the last 72 hours. Hemoglobin A1C: No results for input(s): HGBA1C in the last 72 hours. Fasting Lipid Panel: No results for input(s): CHOL, HDL, LDLCALC, TRIG, CHOLHDL, LDLDIRECT in the last 72 hours. Thyroid Function Tests: No results for input(s): TSH, T4TOTAL, T3FREE, THYROIDAB in the last 72 hours.  Invalid input(s): FREET3 Anemia Panel: No results for input(s): VITAMINB12, FOLATE, FERRITIN, TIBC, IRON, RETICCTPCT in the last 72 hours.   PHYSICAL EXAM General: Well developed, well nourished, in no acute distress HEENT:  Normocephalic and atramatic Neck:  No JVD.  Lungs: Clear bilaterally to auscultation and percussion. Heart: HRRR . Normal S1 and S2 without gallops or murmurs.  Abdomen: Bowel sounds are positive, abdomen soft and non-tender  Msk:   Back normal, normal gait. Normal strength and tone for age. Extremities: No clubbing, cyanosis or edema.   Neuro: Alert and oriented X 3. Psych:  Good affect, responds appropriately  TELEMETRY: Sinus tachycardia  ASSESSMENT AND PLAN: Sinus tachycardia with frequent PVCs and C. difficile colitis. Advise adding metoprolol as continues to have tachycardia in spite of being treated for C. difficile.  Active Problems:   C. difficile colitis    Dionisio David, MD, [facility] 10/19/2017 9:09 AM

## 2017-10-20 DIAGNOSIS — R1312 Dysphagia, oropharyngeal phase: Secondary | ICD-10-CM | POA: Diagnosis not present

## 2017-10-20 DIAGNOSIS — I471 Supraventricular tachycardia: Secondary | ICD-10-CM | POA: Diagnosis not present

## 2017-10-20 DIAGNOSIS — G894 Chronic pain syndrome: Secondary | ICD-10-CM | POA: Diagnosis not present

## 2017-10-20 DIAGNOSIS — K649 Unspecified hemorrhoids: Secondary | ICD-10-CM | POA: Diagnosis not present

## 2017-10-20 DIAGNOSIS — R079 Chest pain, unspecified: Secondary | ICD-10-CM | POA: Diagnosis not present

## 2017-10-20 DIAGNOSIS — A0472 Enterocolitis due to Clostridium difficile, not specified as recurrent: Secondary | ICD-10-CM | POA: Diagnosis not present

## 2017-10-20 DIAGNOSIS — K219 Gastro-esophageal reflux disease without esophagitis: Secondary | ICD-10-CM | POA: Diagnosis not present

## 2017-10-20 DIAGNOSIS — R000 Tachycardia, unspecified: Secondary | ICD-10-CM | POA: Diagnosis not present

## 2017-10-20 DIAGNOSIS — M6281 Muscle weakness (generalized): Secondary | ICD-10-CM | POA: Diagnosis not present

## 2017-10-20 DIAGNOSIS — I10 Essential (primary) hypertension: Secondary | ICD-10-CM | POA: Diagnosis not present

## 2017-10-20 DIAGNOSIS — N390 Urinary tract infection, site not specified: Secondary | ICD-10-CM | POA: Diagnosis not present

## 2017-10-20 DIAGNOSIS — Z7409 Other reduced mobility: Secondary | ICD-10-CM | POA: Diagnosis not present

## 2017-10-20 LAB — CREATININE, SERUM
Creatinine, Ser: 0.5 mg/dL (ref 0.44–1.00)
GFR calc Af Amer: >60 mL/min (ref >60)
GFR calc non Af Amer: >60 mL/min (ref >60)

## 2017-10-20 MED ORDER — HYDROCORTISONE ACETATE 25 MG RE SUPP: 25.0000 mg | Freq: Two times a day (BID) | RECTAL | 0 refills | Status: DC | PRN

## 2017-10-20 MED ORDER — METOPROLOL TARTRATE 50 MG PO TABS: 100.0000 mg | ORAL_TABLET | Freq: Two times a day (BID) | ORAL | Status: DC

## 2017-10-20 MED ORDER — METOPROLOL TARTRATE 50 MG PO TABS
50.0000 mg | ORAL_TABLET | Freq: Two times a day (BID) | ORAL | Status: DC
Start: 2017-10-20 — End: 2017-10-20
  Administered 2017-10-20: 50 mg via ORAL
  Filled 2017-10-20: qty 1

## 2017-10-20 MED ORDER — METOPROLOL TARTRATE 50 MG PO TABS
50.0000 mg | ORAL_TABLET | Freq: Two times a day (BID) | ORAL | Status: DC
Start: 2017-10-20 — End: 2017-11-03

## 2017-10-20 MED ORDER — METRONIDAZOLE 500 MG PO TABS: 500.0000 mg | ORAL_TABLET | Freq: Three times a day (TID) | ORAL | 0 refills | Status: AC

## 2017-10-20 MED ORDER — VANCOMYCIN 50 MG/ML ORAL SOLUTION: 125.0000 mg | Freq: Four times a day (QID) | ORAL | 0 refills | Status: AC

## 2017-10-20 MED ORDER — HYDROCODONE-ACETAMINOPHEN 10-325 MG PO TABS: 1.0000 | ORAL_TABLET | ORAL | 0 refills | Status: DC | PRN

## 2017-10-20 NOTE — Clinical Social Work Placement (Signed)
   CLINICAL SOCIAL WORK PLACEMENT  NOTE  Date:  10/20/2017  Patient Details  Name: Michelle Gregory MRN: [id number] Date of Birth: Jan 08, 1923  Clinical Social Work is seeking post-discharge placement for this patient at the [place] level of care (*CSW will initial, date and re-position this form in  chart as items are completed):  Yes   Patient/family provided with [facility] Work Department's list of facilities offering this level of care within the geographic area requested by the patient (or if unable, by the patient's family).  Yes   Patient/family informed of their freedom to choose among providers that offer the needed level of care, that participate in Medicare, Medicaid or managed care program needed by the patient, have an available bed and are willing to accept the patient.  Yes   Patient/family informed of [facility]'s ownership interest in [facility] and [facility], as well as of the fact that they are under no obligation to receive care at these facilities.  PASRR submitted to EDS on 10/19/17     PASRR number received on 10/19/17     Existing PASRR number confirmed on       FL2 transmitted to all facilities in geographic area requested by pt/family on 10/19/17     FL2 transmitted to all facilities within larger geographic area on       Patient informed that his/her managed care company has contracts with or will negotiate with certain facilities, including the following:        Yes   Patient/family informed of bed offers received.  Patient chooses bed at [facility]     Physician recommends and patient chooses bed at      Patient to be transferred to [facility] on 10/20/17.  Patient to be transferred to facility by [facility]     Patient family notified on 10/20/17 of transfer.  Name of family member notified:  [name] [phone]     PHYSICIAN Please sign FL2     Additional Comment:     _______________________________________________ Ross Ludwig, LCSWA 10/20/2017, 11:42 AM

## 2017-10-20 NOTE — Discharge Summary (Addendum)
[facility] [place] NAME: Michelle Gregory    MR#:  [id number]  DATE OF BIRTH:  07-18-1923  DATE OF ADMISSION:  10/13/2017 ADMITTING PHYSICIAN: Demetrios Loll, MD  DATE OF DISCHARGE: 10/20/2017  PRIMARY CARE PHYSICIAN: Tower, [name], MD    ADMISSION DIAGNOSIS:  Dehydration [E86.0] C. difficile colitis [A04.72] Watery diarrhea [R19.7]  DISCHARGE DIAGNOSIS:  Active Problems:   C. difficile colitis   SECONDARY DIAGNOSIS:   Past Medical History:  Diagnosis Date  . Allergic rhinitis   . Anxiety   . Cancer ([place])   . Depression   . GERD (gastroesophageal reflux disease)   . History of colon cancer   . Hyperlipidemia   . Hypertension   . Osteoarthritis   . Spinal compression fracture ([place])   . Vertigo     HOSPITAL COURSE:   83 year old female with past medical history of hypertension, hyperlipidemia, history of colon cancer, osteoarthritis, depression, anxiety who presented to the hospital due to diarrhea secondary to C. difficile.  1. C. difficile colitis-this was the cause of patient's diarrhea.  Patient was admitted to the hospital and treated with oral vancomycin initially.  Her diarrhea did not improve on that and therefore she was started on IV Flagyl.  After being on IV Flagyl and oral vancomycin her diarrhea has started to improve.  She denies any nausea vomiting, and has no abdominal pain. -She is being discharged on another 8-day course of oral Flagyl and oral vancomycin.   2. SVT- patient developed episodes of SVT due to electrolyte imbalances while in the hospital.  This was actually sinus tachycardia with frequent PACs.  No evidence of atrial fibrillation or flutter.  Patient was observed on telemetry, seen by cardiology and started on low-dose beta-blocker and heart rates have improved.  Her electrolytes have been replaced.  3. Urinary tract infection- this was based off her urinalysis on admission.  Patient received 3 days  of IV ceftriaxone.  Her urine cultures remain negative.  4. Hypokalemia/hypomagnesemia-secondary to diarrhea.  -Patient's potassium and magnesium have been replaced orally and intravenously and is currently stable and improved.  5. History of gout- She will continue allopurinol.  6. Anxiety/depression-she will continue Xanax, Paxil.  7. Essential hypertension- she will continue metoprolol.  8. Neuropathy- she will continue gabapentin/Lyrica. .  9. Hemorrhoids/rectal bleeding-this was secondary to her ongoing diarrhea and irritation. - pt. Will continue Anusol suppositories as needed.  10.  Chronic pain-she will continue her Vicodin as needed.   DISCHARGE CONDITIONS:   Stable  CONSULTS OBTAINED:  Treatment Team:  Dionisio David, MD  DRUG ALLERGIES:   Allergies  Allergen Reactions  . Amoxicillin-Pot Clavulanate     REACTION: diarrhea  . Keflex [Cephalexin] Diarrhea  . Septra [Sulfamethoxazole-Trimethoprim] Nausea Only    DISCHARGE MEDICATIONS:   Allergies as of 10/20/2017      Reactions   Amoxicillin-pot Clavulanate    REACTION: diarrhea   Keflex [cephalexin] Diarrhea   Septra [sulfamethoxazole-trimethoprim] Nausea Only      Medication List    STOP taking these medications   diphenoxylate-atropine 2.5-0.025 MG tablet Commonly known as:  LOMOTIL   HYDROcodone-acetaminophen 5-500 MG tablet Commonly known as:  VICODIN Replaced by:  HYDROcodone-acetaminophen 10-325 MG tablet     TAKE these medications   allopurinol 100 MG tablet Commonly known as:  ZYLOPRIM TAKE 2 TABLETS BY MOUTH  DAILY   ALPRAZolam 0.5 MG tablet Commonly known as:  XANAX 1/2 pill by mouth up to  twice daily as needed for anxiety   aspirin 81 MG tablet Take 81 mg by mouth daily.   calcium-vitamin D 500 MG tablet Take 1 tablet by mouth daily.   gabapentin 300 MG capsule Commonly known as:  NEURONTIN Take 300 mg by mouth 2 (two) times daily.   glucosamine-chondroitin 500-400  MG tablet Take 1 tablet by mouth daily.   HYDROcodone-acetaminophen 10-325 MG tablet Commonly known as:  NORCO Take 1 tablet by mouth every 4 (four) hours as needed for moderate pain. Replaces:  HYDROcodone-acetaminophen 5-500 MG tablet   hydrocortisone 25 MG suppository Commonly known as:  ANUSOL-HC Place 1 suppository (25 mg total) rectally 2 (two) times daily as needed for hemorrhoids or anal itching.   losartan 100 MG tablet Commonly known as:  COZAAR TAKE 1 TABLET BY MOUTH  DAILY   meclizine 25 MG tablet Commonly known as:  ANTIVERT TAKE 1 TABLET (25 MG TOTAL) BY MOUTH 3 (THREE) TIMES  DAILY AS NEEDED.   metoprolol tartrate 50 MG tablet Commonly known as:  LOPRESSOR Take 1 tablet (50 mg total) by mouth 2 (two) times daily. What changed:    medication strength  how much to take   metroNIDAZOLE 500 MG tablet Commonly known as:  FLAGYL Take 1 tablet (500 mg total) by mouth 3 (three) times daily for 8 days.   multivitamin per tablet Take 1 tablet by mouth daily.   PARoxetine 30 MG tablet Commonly known as:  PAXIL TAKE 1 TABLET BY MOUTH  EVERY MORNING   pregabalin 50 MG capsule Commonly known as:  LYRICA Take 50 mg by mouth 2 (two) times daily.   raloxifene 60 MG tablet Commonly known as:  EVISTA TAKE 1 TABLET BY MOUTH DAILY.   vancomycin 50 mg/mL oral solution Commonly known as:  VANCOCIN Take 2.5 mLs (125 mg total) by mouth 4 (four) times daily for 8 days.   vitamin C 100 MG tablet Take 100 mg by mouth daily.         DISCHARGE INSTRUCTIONS:   DIET:  Cardiac diet  DISCHARGE CONDITION:  Stable  ACTIVITY:  Activity as tolerated  OXYGEN:  Home Oxygen: No.   Oxygen Delivery: room air  DISCHARGE LOCATION:  nursing home   If you experience worsening of your admission symptoms, develop shortness of breath, life threatening emergency, suicidal or homicidal thoughts you must seek medical attention immediately by calling 911 or calling your MD  immediately  if symptoms less severe.  You Must read complete instructions/literature along with all the possible adverse reactions/side effects for all the Medicines you take and that have been prescribed to you. Take any new Medicines after you have completely understood and accpet all the possible adverse reactions/side effects.   Please note  You were cared for by a hospitalist during your hospital stay. If you have any questions about your discharge medications or the care you received while you were in the hospital after you are discharged, you can call the unit and asked to speak with the hospitalist on call if the hospitalist that took care of you is not available. Once you are discharged, your primary care physician will handle any further medical issues. Please note that NO REFILLS for any discharge medications will be authorized once you are discharged, as it is imperative that you return to your primary care physician (or establish a relationship with a primary care physician if you do not have one) for your aftercare needs so that they can reassess your need  for medications and monitor your lab values.     Today   Diarrhea much improved.  No N/V or abdominal pain. Tolerating PO well. HR stable. Will d/c to SNF today.   VITAL SIGNS:  Blood pressure (!) 157/77, pulse 93, temperature 98.2 F (36.8 C), temperature source Oral, resp. rate 18, height 5\' 3"  (1.6 m), weight 60.1 kg (132 lb 7.9 oz), last menstrual period 11/02/1980, SpO2 94 %.  I/O:    Intake/Output Summary (Last 24 hours) at 10/20/2017 1128 Last data filed at 10/20/2017 0308 Gross per 24 hour  Intake 1000 ml  Output 250 ml  Net 750 ml    PHYSICAL EXAMINATION:  GENERAL:  83 y.o.-year-old patient lying in the bed with no acute distress.  EYES: Pupils equal, round, reactive to light and accommodation. No scleral icterus. Extraocular muscles intact.  HEENT: Head atraumatic, normocephalic. Oropharynx and nasopharynx  clear.  NECK:  Supple, no jugular venous distention. No thyroid enlargement, no tenderness.  LUNGS: Normal breath sounds bilaterally, no wheezing, rales,rhonchi. No use of accessory muscles of respiration.  CARDIOVASCULAR: S1, S2 RRR. No murmurs, rubs, or gallops.  ABDOMEN: Soft, non-tender, non-distended. Bowel sounds present. No organomegaly or mass.  EXTREMITIES: No pedal edema, cyanosis, or clubbing.  NEUROLOGIC: Cranial nerves II through XII are intact. No focal motor or sensory defecits b/l.  PSYCHIATRIC: The patient is alert and oriented x 3.  SKIN: No obvious rash, lesion, or ulcer.   DATA REVIEW:   CBC Recent Labs  Lab 10/18/17 0557  WBC 8.5  HGB 12.1  HCT 36.7  PLT 186    Chemistries  Recent Labs  Lab 10/13/17 1151  10/19/17 0359 10/20/17 0536  NA 134*   < > 136  --   K 3.8   < > 3.8  --   CL 98*   < > 103  --   CO2 29   < > 27  --   GLUCOSE 105*   < > 107*  --   BUN 29*   < > 9  --   CREATININE 0.89   < > 0.48 0.50  CALCIUM 8.7*   < > 7.9*  --   MG  --    < > 1.4*  --   AST 46*  --   --   --   ALT 18  --   --   --   ALKPHOS 55  --   --   --   BILITOT 1.1  --   --   --    < > = values in this interval not displayed.    Cardiac Enzymes No results for input(s): TROPONINI in the last 168 hours.  Microbiology Results  Results for orders placed or performed during the hospital encounter of 10/13/17  Culture, blood (routine x 2)     Status: None   Collection Time: 10/13/17  1:31 PM  Result Value Ref Range Status   Specimen Description BLOOD RAC  Final   Special Requests   Final    BOTTLES DRAWN AEROBIC AND ANAEROBIC Blood Culture adequate volume   Culture NO GROWTH 5 DAYS  Final   Report Status 10/18/2017 FINAL  Final  Culture, blood (routine x 2)     Status: None   Collection Time: 10/13/17  1:31 PM  Result Value Ref Range Status   Specimen Description BLOOD LAC  Final   Special Requests   Final    BOTTLES DRAWN AEROBIC AND ANAEROBIC Blood Culture  adequate  volume   Culture NO GROWTH 5 DAYS  Final   Report Status 10/18/2017 FINAL  Final  Gastrointestinal Panel by PCR , Stool     Status: None   Collection Time: 10/13/17  2:12 PM  Result Value Ref Range Status   Campylobacter species NOT DETECTED NOT DETECTED Final   Plesimonas shigelloides NOT DETECTED NOT DETECTED Final   Salmonella species NOT DETECTED NOT DETECTED Final   Yersinia enterocolitica NOT DETECTED NOT DETECTED Final   Vibrio species NOT DETECTED NOT DETECTED Final   Vibrio cholerae NOT DETECTED NOT DETECTED Final   Enteroaggregative E coli (EAEC) NOT DETECTED NOT DETECTED Final   Enteropathogenic E coli (EPEC) NOT DETECTED NOT DETECTED Final   Enterotoxigenic E coli (ETEC) NOT DETECTED NOT DETECTED Final   Shiga like toxin producing E coli (STEC) NOT DETECTED NOT DETECTED Final   Shigella/Enteroinvasive E coli (EIEC) NOT DETECTED NOT DETECTED Final   Cryptosporidium NOT DETECTED NOT DETECTED Final   Cyclospora cayetanensis NOT DETECTED NOT DETECTED Final   Entamoeba histolytica NOT DETECTED NOT DETECTED Final   Giardia lamblia NOT DETECTED NOT DETECTED Final   Adenovirus F40/41 NOT DETECTED NOT DETECTED Final   Astrovirus NOT DETECTED NOT DETECTED Final   Norovirus GI/GII NOT DETECTED NOT DETECTED Final   Rotavirus A NOT DETECTED NOT DETECTED Final   Sapovirus (I, II, IV, and V) NOT DETECTED NOT DETECTED Final  C difficile quick scan w PCR reflex     Status: Abnormal   Collection Time: 10/13/17  2:12 PM  Result Value Ref Range Status   C Diff antigen POSITIVE (A) NEGATIVE Final   C Diff toxin POSITIVE (A) NEGATIVE Final   C Diff interpretation Toxin producing C. difficile detected.  Final    Comment: CRITICAL RESULT CALLED TO, READ BACK BY AND VERIFIED WITH: Reola Mosher RN AT  1525 10/13/17. MSS   Urine Culture     Status: None   Collection Time: 10/15/17  1:07 PM  Result Value Ref Range Status   Specimen Description URINE, RANDOM  Final   Special Requests  NONE  Final   Culture   Final    NO GROWTH Performed at [facility], [address], [place], [place] [address]    Report Status 10/16/2017 FINAL  Final    RADIOLOGY:  No results found.    Management plans discussed with the patient, family and they are in agreement.  CODE STATUS:     Code Status Orders  (From admission, onward)        Start     Ordered   10/13/17 1714  Full code  Continuous     10/13/17 1713    Code Status History    Date Active Date Inactive Code Status Order ID Comments User Context   This patient has a current code status but no historical code status.    Advance Directive Documentation     Most Recent Value  Type of Advance Directive  Healthcare Power of Attorney  Pre-existing out of facility DNR order (yellow form or pink MOST form)  No data  "MOST" Form in Place?  No data      TOTAL TIME TAKING CARE OF THIS PATIENT: 40 minutes.    Henreitta Leber M.D on 10/20/2017 at 11:28 AM  Between 7am to 6pm - Pager - [phone]  After 6pm go to [email or web address] - [employer] [facility]  [facility]  Office  [phone]  CC: Primary care physician; Tower, [place],  MD     

## 2017-10-20 NOTE — Progress Notes (Signed)
SUBJECTIVE: No chest pain or shortness of breath   Vitals:   10/19/17 1646 10/19/17 1900 10/20/17 0412 10/20/17 0816  BP: (!) 137/92 133/73 (!) 153/83 (!) 157/77  Pulse: 99 (!) 113 77 93  Resp: 14 16 18    Temp:  98.3 F (36.8 C) 98.3 F (36.8 C) 98.2 F (36.8 C)  TempSrc:  Oral Oral Oral  SpO2: 94% 96% 96% 94%  Weight:      Height:        Intake/Output Summary (Last 24 hours) at 10/20/2017 0942 Last data filed at 10/20/2017 0308 Gross per 24 hour  Intake 1000 ml  Output 250 ml  Net 750 ml    LABS: Basic Metabolic Panel: Recent Labs    10/18/17 0557 10/19/17 0359 10/20/17 0536  NA 138 136  --   K 3.9 3.8  --   CL 106 103  --   CO2 28 27  --   GLUCOSE 101* 107*  --   BUN 12 9  --   CREATININE 0.51 0.48 0.50  CALCIUM 8.0* 7.9*  --   MG  --  1.4*  --    Liver Function Tests: No results for input(s): AST, ALT, ALKPHOS, BILITOT, PROT, ALBUMIN in the last 72 hours. No results for input(s): LIPASE, AMYLASE in the last 72 hours. CBC: Recent Labs    10/18/17 0557  WBC 8.5  HGB 12.1  HCT 36.7  MCV 97.0  PLT 186   Cardiac Enzymes: No results for input(s): CKTOTAL, CKMB, CKMBINDEX, TROPONINI in the last 72 hours. BNP: Invalid input(s): POCBNP D-Dimer: No results for input(s): DDIMER in the last 72 hours. Hemoglobin A1C: No results for input(s): HGBA1C in the last 72 hours. Fasting Lipid Panel: No results for input(s): CHOL, HDL, LDLCALC, TRIG, CHOLHDL, LDLDIRECT in the last 72 hours. Thyroid Function Tests: No results for input(s): TSH, T4TOTAL, T3FREE, THYROIDAB in the last 72 hours.  Invalid input(s): FREET3 Anemia Panel: No results for input(s): VITAMINB12, FOLATE, FERRITIN, TIBC, IRON, RETICCTPCT in the last 72 hours.   PHYSICAL EXAM General: Frail, pale. HEENT:  Normocephalic and atramatic Neck:  No JVD.  Lungs: Clear bilaterally to auscultation and percussion. Heart: Tachycaridic.  Abdomen: Bowel sounds are positive, abdomen soft and non-tender  . Extremities: No clubbing, cyanosis or edema.   Neuro: Alert and oriented X 3. Psych:  Good affect, responds appropriately  TELEMETRY:  sinus tachycardia 102  ASSESSMENT AND PLAN: Remains in sinus tachycardia, no acute distress. Metoprolol started yesterday. Advise close outpatient follow up for titration of metoprolol to therapeutic dose. Follow up with Dr. Neoma Laming 10am Friday 12/21.  Active Problems:   C. difficile colitis    Jake Bathe, NP-C 10/20/2017 9:42 AM

## 2017-10-20 NOTE — Progress Notes (Signed)
Patient discharged to [facility] place via daughters vehicle. Report called to mary at [facility]. IV removed and Tele box off and returned. All dc instructions given and verbalized understanding no complaints at time of DC.

## 2017-10-20 NOTE — Clinical Social Work Note (Addendum)
Patient to be d/c'ed today to [facility].  Patient and family agreeable to plans will transport via daughter's car RN to call report [phone].  Evette Cristal, MSW, [place]

## 2017-10-20 NOTE — Care Management Important Message (Signed)
Important Message  Patient Details  Name: Michelle Gregory MRN: [id number] Date of Birth: 1922-11-22   Medicare Important Message Given:  Yes    Beverly Sessions, RN 10/20/2017, 10:08 AM

## 2017-10-21 DIAGNOSIS — M6281 Muscle weakness (generalized): Secondary | ICD-10-CM | POA: Diagnosis not present

## 2017-10-21 DIAGNOSIS — G894 Chronic pain syndrome: Secondary | ICD-10-CM | POA: Diagnosis not present

## 2017-10-27 DIAGNOSIS — M6281 Muscle weakness (generalized): Secondary | ICD-10-CM | POA: Diagnosis not present

## 2017-10-27 DIAGNOSIS — G894 Chronic pain syndrome: Secondary | ICD-10-CM | POA: Diagnosis not present

## 2017-10-28 DIAGNOSIS — G894 Chronic pain syndrome: Secondary | ICD-10-CM | POA: Diagnosis not present

## 2017-10-28 DIAGNOSIS — M6281 Muscle weakness (generalized): Secondary | ICD-10-CM | POA: Diagnosis not present

## 2017-10-29 DIAGNOSIS — I10 Essential (primary) hypertension: Secondary | ICD-10-CM | POA: Diagnosis not present

## 2017-10-29 DIAGNOSIS — K219 Gastro-esophageal reflux disease without esophagitis: Secondary | ICD-10-CM | POA: Diagnosis not present

## 2017-10-29 DIAGNOSIS — Z7409 Other reduced mobility: Secondary | ICD-10-CM | POA: Diagnosis not present

## 2017-10-29 DIAGNOSIS — A0472 Enterocolitis due to Clostridium difficile, not specified as recurrent: Secondary | ICD-10-CM | POA: Diagnosis not present

## 2017-11-03 ENCOUNTER — Ambulatory Visit (INDEPENDENT_AMBULATORY_CARE_PROVIDER_SITE_OTHER): Payer: MEDICARE | Admitting: Family Medicine

## 2017-11-03 ENCOUNTER — Encounter: Payer: Self-pay | Admitting: Family Medicine

## 2017-11-03 VITALS — BP 114/68 | HR 87 | Temp 98.2°F | Ht 60.5 in | Wt 119.0 lb

## 2017-11-03 DIAGNOSIS — I10 Essential (primary) hypertension: Secondary | ICD-10-CM | POA: Diagnosis not present

## 2017-11-03 DIAGNOSIS — Z8619 Personal history of other infectious and parasitic diseases: Secondary | ICD-10-CM | POA: Diagnosis not present

## 2017-11-03 DIAGNOSIS — R634 Abnormal weight loss: Secondary | ICD-10-CM

## 2017-11-03 DIAGNOSIS — N390 Urinary tract infection, site not specified: Secondary | ICD-10-CM | POA: Diagnosis not present

## 2017-11-03 MED ORDER — METOPROLOL TARTRATE 50 MG PO TABS: 50.0000 mg | ORAL_TABLET | Freq: Two times a day (BID) | ORAL | 3 refills | Status: DC

## 2017-11-03 NOTE — Progress Notes (Signed)
Subjective:    Patient ID: Michelle Gregory, female    DOB: 01-22-1923, 84 y.o.   MRN: [id number]  HPI Here for f/u of a hospital visit  She was hosp at [place] from 12/12 to 10/20/17 for c difficile colitis causing diarrhea and dehydration   She was treated on vancomycin intitially with no imp and then changed to iv flagyl with oral vanco = with improvement  Was d/c on 8 d course of oral flagyl  She developed episodes of SVT due to electrolyte imbalances (found to actually be sinus tach with freq PAC-not SVT) and no afib or flutter  Sen by cardiology- started on low dose beta blocker Labs improved at d/c  K and mag were replaced iv and oral    Wt Readings from Last 3 Encounters:  11/03/17 119 lb (54 kg)  10/16/17 132 lb 7.9 oz (60.1 kg)  09/06/17 124 lb (56.2 kg)  lost a lot of wt during illness and SNF stay  Working on better nutrition  Appetite is better now - home and can eat back to normal    BP Readings from Last 3 Encounters:  11/03/17 114/68  10/20/17 (!) 157/77  10/13/17 (!) 84/48   Pulse Readings from Last 3 Encounters:  11/03/17 87  10/20/17 93  10/13/17 (!) 119  doing well with beta blocker    Temp: 98.2 F (36.8 C)    Results Lab Results  Component Value Date   CREATININE 0.50 10/20/2017   BUN 9 10/19/2017   NA 136 10/19/2017   K 3.8 10/19/2017   CL 103 10/19/2017   CO2 27 10/19/2017   Lab Results  Component Value Date   ALT 18 10/13/2017   AST 46 (H) 10/13/2017   ALKPHOS 55 10/13/2017   BILITOT 1.1 10/13/2017   Blood culture -no growth  Stool cx neg (c diff ag and toxin pos)  Urine cx neg   She was d/c to SNF   [facility] They were not satisfied with care-no Dr saw them until she left Very happy to be home-getting her strength back to normal  Patient Active Problem List   Diagnosis Date Noted  . C. difficile colitis 10/13/2017  . Recurrent UTI (urinary tract infection) 09/06/2017  . Complicated UTI (urinary tract infection) 08/12/2017   . Auditory hallucinations 03/03/2017  . Weight loss 03/03/2017  . Female cystocele 04/13/2016  . Urinary urgency 02/17/2016  . Bowel habit changes 12/30/2015  . Compression fracture 12/30/2015  . Excessive cerumen in right ear canal 05/27/2015  . Ingrown toenail without infection 05/04/2015  . Routine general medical examination at a health care facility 04/09/2015  . Candidal intertrigo 03/28/2014  . Excessive cerumen in both ear canals 01/17/2014  . Encounter for Medicare annual wellness exam 03/21/2013  . Gout 09/19/2012  . Osteopenia 05/05/2011  . Degenerative lumbar disc 01/20/2011  . VARICOSE VEINS, LOWER EXTREMITIES 09/04/2010  . Hyperglycemia 01/08/2010  . Hyperlipidemia 05/09/2008  . GOUT 05/09/2008  . Anxiety state 05/09/2008  . Essential hypertension 05/09/2008  . HEMORRHOIDS, INTERNAL 05/09/2008  . Allergic rhinitis 05/09/2008  . Mild reactive airways disease 05/09/2008  . GERD 05/09/2008  . IBS 05/09/2008  . OSTEOARTHRITIS 05/09/2008  . History of malignant neoplasm of large intestine 05/09/2008   Past Medical History:  Diagnosis Date  . Allergic rhinitis   . Anxiety   . Cancer ([place])   . Depression   . GERD (gastroesophageal reflux disease)   . History of colon cancer   .  Hyperlipidemia   . Hypertension   . Osteoarthritis   . Spinal compression fracture ([place])   . Vertigo    Past Surgical History:  Procedure Laterality Date  . APPENDECTOMY    . CATARACT EXTRACTION    . COLON RESECTION    . TONSILLECTOMY     Social History   Tobacco Use  . Smoking status: Never Smoker  . Smokeless tobacco: Never Used  Substance Use Topics  . Alcohol use: No    Alcohol/week: 0.0 oz  . Drug use: No   Family History  Problem Relation Age of Onset  . Prostate cancer Father   . Heart attack Mother   . Gout Mother    Allergies  Allergen Reactions  . Amoxicillin-Pot Clavulanate     REACTION: diarrhea  . Keflex [Cephalexin] Diarrhea  . Septra  [Sulfamethoxazole-Trimethoprim] Nausea Only   Current Outpatient Medications on File Prior to Visit  Medication Sig Dispense Refill  . allopurinol (ZYLOPRIM) 100 MG tablet TAKE 2 TABLETS BY MOUTH  DAILY 180 tablet 3  . ALPRAZolam (XANAX) 0.5 MG tablet 1/2 pill by mouth up to twice daily as needed for anxiety 30 tablet 3  . Ascorbic Acid (VITAMIN C) 100 MG tablet Take 100 mg by mouth daily.    Marland Kitchen aspirin 81 MG tablet Take 81 mg by mouth daily.      . calcium-vitamin D (CALCIUM 500 +D) 500 MG tablet Take 1 tablet by mouth daily.     Marland Kitchen gabapentin (NEURONTIN) 300 MG capsule Take 300 mg by mouth 2 (two) times daily.   6  . glucosamine-chondroitin 500-400 MG tablet Take 1 tablet by mouth daily.    Marland Kitchen HYDROcodone-acetaminophen (NORCO) 10-325 MG tablet Take 1 tablet by mouth every 4 (four) hours as needed for moderate pain. 30 tablet 0  . hydrocortisone (ANUSOL-HC) 25 MG suppository Place 1 suppository (25 mg total) rectally 2 (two) times daily as needed for hemorrhoids or anal itching. 12 suppository 0  . losartan (COZAAR) 100 MG tablet TAKE 1 TABLET BY MOUTH  DAILY 90 tablet 3  . meclizine (ANTIVERT) 25 MG tablet TAKE 1 TABLET (25 MG TOTAL) BY MOUTH 3 (THREE) TIMES  DAILY AS NEEDED. 270 tablet 0  . multivitamin (THERAGRAN) per tablet Take 1 tablet by mouth daily.      Marland Kitchen PARoxetine (PAXIL) 30 MG tablet TAKE 1 TABLET BY MOUTH  EVERY MORNING 90 tablet 3  . pregabalin (LYRICA) 50 MG capsule Take 50 mg by mouth 2 (two) times daily.     . raloxifene (EVISTA) 60 MG tablet TAKE 1 TABLET BY MOUTH DAILY. 90 tablet 3   No current facility-administered medications on file prior to visit.     Review of Systems  Constitutional: Positive for fatigue. Negative for activity change, appetite change, fever and unexpected weight change.  HENT: Negative for congestion, ear pain, rhinorrhea, sinus pressure and sore throat.   Eyes: Negative for pain, redness and visual disturbance.  Respiratory: Negative for cough,  shortness of breath and wheezing.   Cardiovascular: Negative for chest pain and palpitations.  Gastrointestinal: Negative for abdominal distention, abdominal pain, anal bleeding, blood in stool, constipation, diarrhea and nausea.  Endocrine: Negative for polydipsia and polyuria.  Genitourinary: Negative for dysuria, frequency and urgency.  Musculoskeletal: Negative for arthralgias, back pain and myalgias.  Skin: Negative for pallor and rash.  Allergic/Immunologic: Negative for environmental allergies.  Neurological: Negative for dizziness, syncope and headaches.  Hematological: Negative for adenopathy. Does not bruise/bleed easily.  Psychiatric/Behavioral: Negative  for decreased concentration and dysphoric mood. The patient is not nervous/anxious.        Objective:   Physical Exam  Constitutional: She appears well-developed and well-nourished. No distress.  Well appearing elderly female -weight loss noted   HENT:  Head: Normocephalic and atraumatic.  Mouth/Throat: Oropharynx is clear and moist.  MMM  Eyes: Conjunctivae and EOM are normal. Pupils are equal, round, and reactive to light. No scleral icterus.  Neck: Normal range of motion. Neck supple.  Cardiovascular: Normal rate, regular rhythm and normal heart sounds.  Pulmonary/Chest: Effort normal and breath sounds normal. No respiratory distress. She has no wheezes. She has no rales.  Abdominal: Soft. Bowel sounds are normal. She exhibits no distension and no mass. There is no tenderness. There is no rebound and no guarding.  Nl bs No tenderness or M  Lymphadenopathy:    She has no cervical adenopathy.  Neurological: She is alert. No cranial nerve deficit. She exhibits normal muscle tone. Coordination normal.  Skin: Skin is warm and dry. No rash noted. No erythema. No pallor.  Nl color and turgor  No signs of dehydration  Psychiatric: She has a normal mood and affect.  Cheerful and talkative           Assessment & Plan:     Problem List Items Addressed This Visit      Cardiovascular and Mediastinum   Essential hypertension    bp is stable s/p hospitalization and start of metoprolol for sinus tach and PACs  Reviewed hospital records, lab results and studies in detail   bp and pulse are stable and well controlled- will continue for now  BP: 114/68  Pulse Rate: 87        Relevant Medications   metoprolol tartrate (LOPRESSOR) 50 MG tablet     Genitourinary   Recurrent UTI (urinary tract infection)    No uti currently  Now with pessary  Would not start proph abx due to hx of c diff now        Other   H/O Clostridium difficile infection - Primary    Reviewed hospital records, lab results and studies in detail  Clinically resolved  Unclear whether she got the flagyl at SNF-but no more symptoms either way after tx with vanc and flagyl   Well re hydrated and appetite returned  Disc in protein in diet for wt gain  Update if any return of GI symptoms or any fever  Watch closely      Weight loss    More after hosp for c diff Appetite is back and eating more now that she is home  Disc imp of inc protein and calories in general

## 2017-11-03 NOTE — Patient Instructions (Addendum)
Please go ahead and start a pro biotic as directed   Alert me if diarrhea returns and persists -or any fever or other symptoms  Get back to a regular diet  Also very important to drink fluids (optimally 64 oz per day)  Gradually get back to usual routine

## 2017-11-04 NOTE — Assessment & Plan Note (Signed)
Reviewed hospital records, lab results and studies in detail  Clinically resolved  Unclear whether she got the flagyl at SNF-but no more symptoms either way after tx with vanc and flagyl   Well re hydrated and appetite returned  Disc in protein in diet for wt gain  Update if any return of GI symptoms or any fever  Watch closely

## 2017-11-04 NOTE — Assessment & Plan Note (Signed)
More after hosp for c diff Appetite is back and eating more now that she is home  Disc imp of inc protein and calories in general

## 2017-11-04 NOTE — Assessment & Plan Note (Addendum)
bp is stable s/p hospitalization and start of metoprolol for sinus tach and PACs  Reviewed hospital records, lab results and studies in detail   bp and pulse are stable and well controlled- will continue for now  BP: 114/68  Pulse Rate: 87

## 2017-11-04 NOTE — Assessment & Plan Note (Signed)
No uti currently  Now with pessary  Would not start proph abx due to hx of c diff now

## 2017-11-18 ENCOUNTER — Telehealth: Payer: Self-pay

## 2017-11-18 NOTE — Telephone Encounter (Signed)
Copied from [place] [id number]. Topic: Referral - Request >> Nov 18, 2017  3:55 PM Vernona Rieger wrote: Daughter is requesting a referral be sent to [facility] for homehealth. Fax is [phone]

## 2017-11-18 NOTE — Telephone Encounter (Signed)
Is this for generalized weakness (does she feel she needs PT)?

## 2017-11-18 NOTE — Telephone Encounter (Signed)
Pt last seen 11/03/17.Please advise.

## 2017-12-06 ENCOUNTER — Encounter: Payer: Self-pay | Admitting: Family Medicine

## 2017-12-06 ENCOUNTER — Ambulatory Visit (INDEPENDENT_AMBULATORY_CARE_PROVIDER_SITE_OTHER): Payer: MEDICARE | Admitting: Family Medicine

## 2017-12-06 VITALS — BP 128/84 | HR 94 | Temp 98.0°F | Ht 60.5 in | Wt 118.0 lb

## 2017-12-06 DIAGNOSIS — H6123 Impacted cerumen, bilateral: Secondary | ICD-10-CM

## 2017-12-06 DIAGNOSIS — R634 Abnormal weight loss: Secondary | ICD-10-CM

## 2017-12-06 DIAGNOSIS — M1990 Unspecified osteoarthritis, unspecified site: Secondary | ICD-10-CM

## 2017-12-06 DIAGNOSIS — H6120 Impacted cerumen, unspecified ear: Secondary | ICD-10-CM | POA: Insufficient documentation

## 2017-12-06 NOTE — Progress Notes (Signed)
Subjective:    Patient ID: Michelle Gregory, female    DOB: [birth year], 84 y.o.   MRN: [id number]  HPI Here for issue with ears  Within last 2 weeks -hearing has become worse    Wt Readings from Last 3 Encounters:  12/06/17 118 lb (53.5 kg)  11/03/17 119 lb (54 kg)  10/16/17 132 lb 7.9 oz (60.1 kg)   22.67 kg/m   Sudden decrease in hearing  Thinks she may have wax in ears  Would never consider hearing aides   Ears are not hurting   Needs home care-help with bathing and dressing  Looking for help privately   Patient Active Problem List   Diagnosis Date Noted  . Cerumen impaction 12/06/2017  . H/O Clostridium difficile infection 10/13/2017  . Recurrent UTI (urinary tract infection) 09/06/2017  . Auditory hallucinations 03/03/2017  . Weight loss 03/03/2017  . Female cystocele 04/13/2016  . Urinary urgency 02/17/2016  . Bowel habit changes 12/30/2015  . Compression fracture 12/30/2015  . Ingrown toenail without infection 05/04/2015  . Routine general medical examination at a health care facility 04/09/2015  . Candidal intertrigo 03/28/2014  . Encounter for Medicare annual wellness exam 03/21/2013  . Gout 09/19/2012  . Osteopenia 05/05/2011  . Degenerative lumbar disc 01/20/2011  . VARICOSE VEINS, LOWER EXTREMITIES 09/04/2010  . Hyperglycemia 01/08/2010  . Hyperlipidemia 05/09/2008  . GOUT 05/09/2008  . Anxiety state 05/09/2008  . Essential hypertension 05/09/2008  . HEMORRHOIDS, INTERNAL 05/09/2008  . Allergic rhinitis 05/09/2008  . Mild reactive airways disease 05/09/2008  . GERD 05/09/2008  . IBS 05/09/2008  . Osteoarthritis 05/09/2008  . History of malignant neoplasm of large intestine 05/09/2008   Past Medical History:  Diagnosis Date  . Allergic rhinitis   . Anxiety   . Cancer ([place])   . Depression   . GERD (gastroesophageal reflux disease)   . History of colon cancer   . Hyperlipidemia   . Hypertension   . Osteoarthritis   . Spinal compression  fracture ([place])   . Vertigo    Past Surgical History:  Procedure Laterality Date  . APPENDECTOMY    . CATARACT EXTRACTION    . COLON RESECTION    . TONSILLECTOMY     Social History   Tobacco Use  . Smoking status: Never Smoker  . Smokeless tobacco: Never Used  Substance Use Topics  . Alcohol use: No    Alcohol/week: 0.0 oz  . Drug use: No   Family History  Problem Relation Age of Onset  . Prostate cancer Father   . Heart attack Mother   . Gout Mother    Allergies  Allergen Reactions  . Amoxicillin-Pot Clavulanate     REACTION: diarrhea  . Keflex [Cephalexin] Diarrhea  . Septra [Sulfamethoxazole-Trimethoprim] Nausea Only   Current Outpatient Medications on File Prior to Visit  Medication Sig Dispense Refill  . allopurinol (ZYLOPRIM) 100 MG tablet TAKE 2 TABLETS BY MOUTH  DAILY 180 tablet 3  . ALPRAZolam (XANAX) 0.5 MG tablet 1/2 pill by mouth up to twice daily as needed for anxiety 30 tablet 3  . Ascorbic Acid (VITAMIN C) 100 MG tablet Take 100 mg by mouth daily.    Marland Kitchen aspirin 81 MG tablet Take 81 mg by mouth daily.      . calcium-vitamin D (CALCIUM 500 +D) 500 MG tablet Take 1 tablet by mouth daily.     Marland Kitchen gabapentin (NEURONTIN) 300 MG capsule Take 300 mg by mouth 2 (two) times daily.  6  . glucosamine-chondroitin 500-400 MG tablet Take 1 tablet by mouth daily.    Marland Kitchen HYDROcodone-acetaminophen (NORCO) 10-325 MG tablet Take 1 tablet by mouth every 4 (four) hours as needed for moderate pain. 30 tablet 0  . hydrocortisone (ANUSOL-HC) 25 MG suppository Place 1 suppository (25 mg total) rectally 2 (two) times daily as needed for hemorrhoids or anal itching. 12 suppository 0  . losartan (COZAAR) 100 MG tablet TAKE 1 TABLET BY MOUTH  DAILY 90 tablet 3  . meclizine (ANTIVERT) 25 MG tablet TAKE 1 TABLET (25 MG TOTAL) BY MOUTH 3 (THREE) TIMES  DAILY AS NEEDED. 270 tablet 0  . metoprolol tartrate (LOPRESSOR) 50 MG tablet Take 1 tablet (50 mg total) by mouth 2 (two) times daily. 180  tablet 3  . multivitamin (THERAGRAN) per tablet Take 1 tablet by mouth daily.      Marland Kitchen PARoxetine (PAXIL) 30 MG tablet TAKE 1 TABLET BY MOUTH  EVERY MORNING 90 tablet 3  . pregabalin (LYRICA) 50 MG capsule Take 50 mg by mouth 2 (two) times daily.     . raloxifene (EVISTA) 60 MG tablet TAKE 1 TABLET BY MOUTH DAILY. 90 tablet 3  . [DISCONTINUED] Glucosamine-Chondroitin 1500-1200 MG/30ML LIQD Take by mouth. As dierected      No current facility-administered medications on file prior to visit.      Review of Systems  Constitutional: Negative for activity change, appetite change, fatigue, fever and unexpected weight change.  HENT: Positive for hearing loss. Negative for congestion, ear pain, facial swelling, postnasal drip, rhinorrhea, sinus pressure, sinus pain and sore throat.   Eyes: Negative for pain, redness and visual disturbance.  Respiratory: Negative for cough, shortness of breath and wheezing.   Cardiovascular: Negative for chest pain and palpitations.  Gastrointestinal: Negative for abdominal pain, blood in stool, constipation and diarrhea.  Endocrine: Negative for polydipsia and polyuria.  Genitourinary: Negative for dysuria, frequency and urgency.  Musculoskeletal: Positive for arthralgias, back pain and gait problem. Negative for joint swelling and myalgias.  Skin: Negative for pallor and rash.  Allergic/Immunologic: Negative for environmental allergies.  Neurological: Negative for dizziness, syncope and headaches.  Hematological: Negative for adenopathy. Does not bruise/bleed easily.  Psychiatric/Behavioral: Negative for decreased concentration and dysphoric mood. The patient is not nervous/anxious.        Objective:   Physical Exam  Constitutional: She appears well-developed and well-nourished. No distress.  Frail appearing elderly female who it HOH  HENT:  Head: Normocephalic and atraumatic.  Mouth/Throat: Oropharynx is clear and moist.  R ear canal is totally impacted  with cerumen  L ear canal has small amt of cerumen  Eyes: Conjunctivae and EOM are normal. Pupils are equal, round, and reactive to light. No scleral icterus.  Neck: Normal range of motion. Neck supple.  Cardiovascular: Normal rate, regular rhythm and normal heart sounds.  Pulmonary/Chest: Effort normal and breath sounds normal. No respiratory distress. She has no wheezes. She has no rales.  Lymphadenopathy:    She has no cervical adenopathy.  Neurological: She is alert. No cranial nerve deficit.  Skin: Skin is warm and dry. No rash noted. No erythema. No pallor.  Psychiatric: She has a normal mood and affect.          Assessment & Plan:   Problem List Items Addressed This Visit      Nervous and Auditory   Cerumen impaction - Primary    Bilateral but worse on the right  Causing hearing loss (R ear is the better  ear baseline)  Attempted simple irrigation today w/o success (and some discomfort) inst to get Debrox otc and use every other day to soften the blockage F/u 1-2 wk to re attempt irritation         Musculoskeletal and Integument   Osteoarthritis    Multiple joints  Handicapped parking form filled out today   Needing more help with ADLs at home such as bathing and dressing  Daughter is looking to hire someone privately for help several hours per day        Other   Weight loss    Not gaining weight back from prior illness/ but is stable  Appetite is good Disc adding calories in the form of supplements between meals if needed to gain weight

## 2017-12-06 NOTE — Assessment & Plan Note (Addendum)
Multiple joints  Handicapped parking form filled out today   Needing more help with ADLs at home such as bathing and dressing  Daughter is looking to hire someone privately for help several hours per day

## 2017-12-06 NOTE — Assessment & Plan Note (Signed)
Not gaining weight back from prior illness/ but is stable  Appetite is good Disc adding calories in the form of supplements between meals if needed to gain weight

## 2017-12-06 NOTE — Assessment & Plan Note (Addendum)
Bilateral but worse on the right  Causing hearing loss (R ear is the better ear baseline)  Attempted simple irrigation today w/o success (and some discomfort) inst to get Debrox otc and use every other day to soften the blockage F/u 1-2 wk to re attempt irritation

## 2017-12-06 NOTE — Patient Instructions (Signed)
Go to the drugstore and buy a product for ear wax like Debrox  Use it every other day for 1-2 weeks in the right ear  Follow up and we will try and irrigate the ear again

## 2017-12-07 DIAGNOSIS — N814 Uterovaginal prolapse, unspecified: Secondary | ICD-10-CM | POA: Diagnosis not present

## 2017-12-07 DIAGNOSIS — N812 Incomplete uterovaginal prolapse: Secondary | ICD-10-CM | POA: Diagnosis not present

## 2017-12-20 ENCOUNTER — Ambulatory Visit: Payer: MEDICARE | Admitting: Family Medicine

## 2017-12-29 ENCOUNTER — Ambulatory Visit: Payer: MEDICARE | Admitting: Family Medicine

## 2018-02-09 DIAGNOSIS — Z79891 Long term (current) use of opiate analgesic: Secondary | ICD-10-CM | POA: Diagnosis not present

## 2018-02-09 DIAGNOSIS — G894 Chronic pain syndrome: Secondary | ICD-10-CM | POA: Diagnosis not present

## 2018-02-09 DIAGNOSIS — M545 Low back pain: Secondary | ICD-10-CM | POA: Diagnosis not present

## 2018-02-09 DIAGNOSIS — G8929 Other chronic pain: Secondary | ICD-10-CM | POA: Insufficient documentation

## 2018-02-14 ENCOUNTER — Encounter: Payer: Self-pay | Admitting: Podiatry

## 2018-02-14 ENCOUNTER — Ambulatory Visit (INDEPENDENT_AMBULATORY_CARE_PROVIDER_SITE_OTHER): Payer: MEDICARE | Admitting: Podiatry

## 2018-02-14 DIAGNOSIS — B351 Tinea unguium: Secondary | ICD-10-CM | POA: Diagnosis not present

## 2018-02-14 DIAGNOSIS — M79676 Pain in unspecified toe(s): Secondary | ICD-10-CM | POA: Diagnosis not present

## 2018-02-18 NOTE — Progress Notes (Signed)
   SUBJECTIVE Patient presents to office today complaining of elongated, thickened nails that cause pain while ambulating in shoes. She is unable to trim her own nails. She reports h/o c-diff four months ago. Patient is here for further evaluation and treatment.  Past Medical History:  Diagnosis Date  . Allergic rhinitis   . Anxiety   . Cancer ([place])   . Depression   . GERD (gastroesophageal reflux disease)   . History of colon cancer   . Hyperlipidemia   . Hypertension   . Osteoarthritis   . Spinal compression fracture ([place])   . Vertigo     OBJECTIVE General Patient is awake, alert, and oriented x 3 and in no acute distress. Derm Skin is dry and supple bilateral. Negative open lesions or macerations. Remaining integument unremarkable. Nails are tender, long, thickened and dystrophic with subungual debris, consistent with onychomycosis, 1-5 bilateral. No signs of infection noted. Vasc  DP and PT pedal pulses palpable bilaterally. Temperature gradient within normal limits.  Neuro Epicritic and protective threshold sensation grossly intact bilaterally.  Musculoskeletal Exam No symptomatic pedal deformities noted bilateral. Muscular strength within normal limits.  ASSESSMENT 1. Onychodystrophic nails 1-5 bilateral with hyperkeratosis of nails.  2. Onychomycosis of nail due to dermatophyte bilateral 3. Pain in foot bilateral  PLAN OF CARE 1. Patient evaluated today.  2. Instructed to maintain good pedal hygiene and foot care.  3. Mechanical debridement of nails 1-5 bilaterally performed using a nail nipper. Filed with dremel without incident.  4. Return to clinic in 3 mos.    Edrick Kins, DPM [facility]  Dr. Edrick Kins, [address]                                        [place], [place] [address]                Office [phone]  Fax [phone]

## 2018-03-25 ENCOUNTER — Ambulatory Visit (INDEPENDENT_AMBULATORY_CARE_PROVIDER_SITE_OTHER): Payer: MEDICARE | Admitting: Family Medicine

## 2018-03-25 ENCOUNTER — Encounter: Payer: Self-pay | Admitting: Family Medicine

## 2018-03-25 VITALS — BP 112/66 | HR 95 | Temp 98.3°F | Ht 60.5 in

## 2018-03-25 DIAGNOSIS — F411 Generalized anxiety disorder: Secondary | ICD-10-CM | POA: Diagnosis not present

## 2018-03-25 DIAGNOSIS — N8110 Cystocele, unspecified: Secondary | ICD-10-CM | POA: Diagnosis not present

## 2018-03-25 DIAGNOSIS — R110 Nausea: Secondary | ICD-10-CM

## 2018-03-25 DIAGNOSIS — N390 Urinary tract infection, site not specified: Secondary | ICD-10-CM

## 2018-03-25 DIAGNOSIS — Z8619 Personal history of other infectious and parasitic diseases: Secondary | ICD-10-CM | POA: Diagnosis not present

## 2018-03-25 LAB — POC URINALSYSI DIPSTICK (AUTOMATED)
Glucose, UA: NEGATIVE
Nitrite, UA: NEGATIVE
Protein, UA: POSITIVE — AB
Spec Grav, UA: 1.025 (ref 1.010–1.025)
Urobilinogen, UA: 1 E.U./dL
pH, UA: 6 (ref 5.0–8.0)

## 2018-03-25 MED ORDER — CIPROFLOXACIN HCL 250 MG PO TABS: 250.0000 mg | ORAL_TABLET | Freq: Two times a day (BID) | ORAL | 0 refills | Status: DC

## 2018-03-25 NOTE — Progress Notes (Signed)
BP 112/66 (BP Location: Left Arm, Patient Position: Sitting, Cuff Size: Normal)   Pulse 95   Temp 98.3 F (36.8 C) (Oral)   Ht 5' 0.5" (1.537 m)   LMP 11/02/1980   SpO2 97%   BMI 22.67 kg/m    CC: nausea Subjective:    Patient ID: Michelle Gregory, female    DOB: 11/11/1922, 84 y.o.   MRN: [id number]  HPI: Michelle Gregory is a 85 y.o. female presenting on 03/25/2018 for Nausea (Pt accompanied by daughter, [name].  Per [name], thinks pt may have had anixety attack. Wants to discuss taking extra dose of alprazolam.)   Difficult history - sounds like pt didn't feel well last night, ?anxiety related. She states news shows on TV concerned her and may frighten her. Looser stools started last night. Mild abd discomfort. Nausea started this morning, however did not vomit. Daughter came to the house this morning and gave her an alprazolam 9am with some benefit. Last night noted small amt blood when wiping (known hemorrhoids) but none noted in commode.   Denies fevers/chills, chest pain, dyspnea, cough, palpitations, dizziness.  Denies dysuria, urgency, frequency. No blood in urine.  Denies confusion or daytime somnolence.   H/o C diff, h/o UTI.   Pt lives alone. In-home nurse coming tonight (2 nights a week).  Daughter seems anxious as well as patient.   Currently taking gabapentin, not lyrica - per pain management.  Regularly takes paxil 30mg  nightly.  On alprazolam 0.5mg  1/2 tab PRN - very intermittent use.   Relevant past medical, surgical, family and social history reviewed and updated as indicated. Interim medical history since our last visit reviewed. Allergies and medications reviewed and updated. Outpatient Medications Prior to Visit  Medication Sig Dispense Refill  . allopurinol (ZYLOPRIM) 100 MG tablet TAKE 2 TABLETS BY MOUTH  DAILY 180 tablet 3  . ALPRAZolam (XANAX) 0.5 MG tablet 1/2 pill by mouth up to twice daily as needed for anxiety 30 tablet 3  . Ascorbic Acid (VITAMIN C) 100  MG tablet Take 100 mg by mouth daily.    Marland Kitchen aspirin 81 MG tablet Take 81 mg by mouth daily.      . calcium-vitamin D (CALCIUM 500 +D) 500 MG tablet Take 1 tablet by mouth daily.     Marland Kitchen CRANBERRY PO Take 1 capsule by mouth 2 (two) times daily.    Marland Kitchen gabapentin (NEURONTIN) 300 MG capsule Take 300 mg by mouth 2 (two) times daily.   6  . glucosamine-chondroitin 500-400 MG tablet Take 1 tablet by mouth daily.    Marland Kitchen HYDROcodone-acetaminophen (NORCO) 10-325 MG tablet Take 1 tablet by mouth every 4 (four) hours as needed for moderate pain. 30 tablet 0  . hydrocortisone (ANUSOL-HC) 25 MG suppository Place 1 suppository (25 mg total) rectally 2 (two) times daily as needed for hemorrhoids or anal itching. 12 suppository 0  . losartan (COZAAR) 100 MG tablet TAKE 1 TABLET BY MOUTH  DAILY 90 tablet 3  . meclizine (ANTIVERT) 25 MG tablet TAKE 1 TABLET (25 MG TOTAL) BY MOUTH 3 (THREE) TIMES  DAILY AS NEEDED. 270 tablet 0  . metoprolol tartrate (LOPRESSOR) 50 MG tablet Take 1 tablet (50 mg total) by mouth 2 (two) times daily. 180 tablet 3  . multivitamin (THERAGRAN) per tablet Take 1 tablet by mouth daily.      Marland Kitchen PARoxetine (PAXIL) 30 MG tablet TAKE 1 TABLET BY MOUTH  EVERY MORNING 90 tablet 3  . pregabalin (LYRICA) 50 MG  capsule Take 50 mg by mouth 2 (two) times daily.     . Probiotic Product (PROBIOTIC DAILY PO) Take 1 tablet by mouth daily.    . raloxifene (EVISTA) 60 MG tablet TAKE 1 TABLET BY MOUTH DAILY. 90 tablet 3   No facility-administered medications prior to visit.      Per HPI unless specifically indicated in ROS section below Review of Systems     Objective:    BP 112/66 (BP Location: Left Arm, Patient Position: Sitting, Cuff Size: Normal)   Pulse 95   Temp 98.3 F (36.8 C) (Oral)   Ht 5' 0.5" (1.537 m)   LMP 11/02/1980   SpO2 97%   BMI 22.67 kg/m   Wt Readings from Last 3 Encounters:  12/06/17 118 lb (53.5 kg)  11/03/17 119 lb (54 kg)  10/16/17 132 lb 7.9 oz (60.1 kg)    Physical  Exam  Constitutional: She appears well-developed and well-nourished. No distress.  HENT:  Mouth/Throat: Oropharynx is clear and moist. No oropharyngeal exudate.  Cardiovascular: Normal rate and normal heart sounds. A regularly irregular rhythm present.  No murmur heard. Pulmonary/Chest: Effort normal and breath sounds normal. No respiratory distress. She has no wheezes. She has no rales.  Abdominal: Soft. Bowel sounds are normal. She exhibits no distension and no mass. There is no hepatosplenomegaly. There is no tenderness. There is no rebound, no guarding and no CVA tenderness. No hernia.  Musculoskeletal: She exhibits no edema.  Psychiatric: She has a normal mood and affect.  Nursing note and vitals reviewed.  Results for orders placed or performed in visit on 03/25/18  POCT Urinalysis Dipstick (Automated)  Result Value Ref Range   Color, UA gold    Clarity, UA clear    Glucose, UA Negative Negative   Bilirubin, UA 1+    Ketones, UA trace    Spec Grav, UA 1.025 1.010 - 1.025   Blood, UA trace    pH, UA 6.0 5.0 - 8.0   Protein, UA Positive (A) Negative   Urobilinogen, UA 1.0 0.2 or 1.0 E.U./dL   Nitrite, UA negative    Leukocytes, UA Large (3+) (A) Negative   Lab Results  Component Value Date   CREATININE 0.50 10/20/2017       Assessment & Plan:   Problem List Items Addressed This Visit    Anxiety state    Ok to use xanax PRN at night time.       Female cystocele    Pessary in place.       H/O Clostridium difficile infection    Current loose stools not consistent with C diff recurrence. Reviewed red flags to monitor for C diff recurrence.       Nausea - Primary    Unclear story - sounds like she had episode last night that consisted of malaise, anxiety, nausea and diarrhea. Daughter feels anxiety contributing.  UA today - possible infection - will await culture prior to starting abx given recent h/o C diff. Discussed ok to try xanax at night if helpful to help her  sleep.  Has nurse coming out to stay with tonight her.       Relevant Orders   Urine Culture   POCT Urinalysis Dipstick (Automated) (Completed)   Recurrent UTI (urinary tract infection)    H/o this, along with urinary incontinence, has pessary changed Q3 mo, next change due next month.          Meds ordered this encounter  Medications  .  ciprofloxacin (CIPRO) 250 MG tablet    Sig: Take 1 tablet (250 mg total) by mouth 2 (two) times daily.    Dispense:  6 tablet    Refill:  0   Orders Placed This Encounter  Procedures  . Urine Culture  . POCT Urinalysis Dipstick (Automated)    Follow up plan: No follow-ups on file.  Ria Bush, MD

## 2018-03-25 NOTE — Patient Instructions (Signed)
Urine culture sent today  Ok to try alprazolam at night time as needed for sleep Continue cranberry tablets. Push small sips of fluid throughout the day.  Antibiotic prescription printed out today - fill if fever, abdominal discomfort, urinary symptoms return over weekend. Otherwise wait for urine culture results.

## 2018-03-26 LAB — URINE CULTURE
MICRO NUMBER:: 90633477
SPECIMEN QUALITY:: ADEQUATE

## 2018-03-26 NOTE — Assessment & Plan Note (Signed)
H/o this, along with urinary incontinence, has pessary changed Q3 mo, next change due next month.

## 2018-03-26 NOTE — Assessment & Plan Note (Addendum)
Unclear story - sounds like she had episode last night that consisted of malaise, anxiety, nausea and diarrhea. Daughter feels anxiety contributing.  UA today - possible infection - will await culture prior to starting abx given recent h/o C diff. Discussed ok to try xanax at night if helpful to help her sleep.  Has nurse coming out to stay with tonight her.

## 2018-03-26 NOTE — Assessment & Plan Note (Signed)
Pessary in place.

## 2018-03-26 NOTE — Assessment & Plan Note (Signed)
Ok to use xanax PRN at night time.

## 2018-03-26 NOTE — Assessment & Plan Note (Addendum)
Current loose stools not consistent with C diff recurrence. Reviewed red flags to monitor for C diff recurrence.

## 2018-03-29 ENCOUNTER — Encounter: Payer: Self-pay | Admitting: Family Medicine

## 2018-03-29 ENCOUNTER — Ambulatory Visit (INDEPENDENT_AMBULATORY_CARE_PROVIDER_SITE_OTHER): Payer: MEDICARE | Admitting: Family Medicine

## 2018-03-29 VITALS — BP 118/62 | HR 75 | Temp 97.9°F | Ht 60.5 in | Wt 120.8 lb

## 2018-03-29 DIAGNOSIS — R5382 Chronic fatigue, unspecified: Secondary | ICD-10-CM

## 2018-03-29 DIAGNOSIS — S300XXA Contusion of lower back and pelvis, initial encounter: Secondary | ICD-10-CM | POA: Diagnosis not present

## 2018-03-29 DIAGNOSIS — F419 Anxiety disorder, unspecified: Secondary | ICD-10-CM | POA: Insufficient documentation

## 2018-03-29 DIAGNOSIS — F418 Other specified anxiety disorders: Secondary | ICD-10-CM

## 2018-03-29 DIAGNOSIS — W19XXXA Unspecified fall, initial encounter: Secondary | ICD-10-CM

## 2018-03-29 DIAGNOSIS — R5383 Other fatigue: Secondary | ICD-10-CM | POA: Insufficient documentation

## 2018-03-29 DIAGNOSIS — M5136 Other intervertebral disc degeneration, lumbar region: Secondary | ICD-10-CM | POA: Diagnosis not present

## 2018-03-29 DIAGNOSIS — R531 Weakness: Secondary | ICD-10-CM | POA: Insufficient documentation

## 2018-03-29 DIAGNOSIS — Y92009 Unspecified place in unspecified non-institutional (private) residence as the place of occurrence of the external cause: Secondary | ICD-10-CM

## 2018-03-29 DIAGNOSIS — I10 Essential (primary) hypertension: Secondary | ICD-10-CM | POA: Diagnosis not present

## 2018-03-29 DIAGNOSIS — R440 Auditory hallucinations: Secondary | ICD-10-CM | POA: Diagnosis not present

## 2018-03-29 DIAGNOSIS — M51369 Other intervertebral disc degeneration, lumbar region without mention of lumbar back pain or lower extremity pain: Secondary | ICD-10-CM

## 2018-03-29 LAB — CBC WITH DIFFERENTIAL/PLATELET
Basophils Absolute: 0.1 10*3/uL (ref 0.0–0.1)
Basophils Relative: 0.8 % (ref 0.0–3.0)
Eosinophils Absolute: 0.4 10*3/uL (ref 0.0–0.7)
Eosinophils Relative: 5.2 % — ABNORMAL HIGH (ref 0.0–5.0)
HCT: 38.4 % (ref 36.0–46.0)
Hemoglobin: 13 g/dL (ref 12.0–15.0)
Lymphocytes Relative: 13.8 % (ref 12.0–46.0)
Lymphs Abs: 1.1 10*3/uL (ref 0.7–4.0)
MCHC: 34 g/dL (ref 30.0–36.0)
MCV: 98 fl (ref 78.0–100.0)
Monocytes Absolute: 0.9 10*3/uL (ref 0.1–1.0)
Monocytes Relative: 11.6 % (ref 3.0–12.0)
Neutro Abs: 5.5 10*3/uL (ref 1.4–7.7)
Neutrophils Relative %: 68.6 % (ref 43.0–77.0)
Platelets: 101 10*3/uL — ABNORMAL LOW (ref 150.0–400.0)
RBC: 3.92 Mil/uL (ref 3.87–5.11)
RDW: 13.7 % (ref 11.5–15.5)
WBC: 8 10*3/uL (ref 4.0–10.5)

## 2018-03-29 LAB — COMPREHENSIVE METABOLIC PANEL
ALT: 11 U/L (ref 0–35)
AST: 17 U/L (ref 0–37)
Albumin: 3.8 g/dL (ref 3.5–5.2)
Alkaline Phosphatase: 63 U/L (ref 39–117)
BUN: 31 mg/dL — ABNORMAL HIGH (ref 6–23)
CO2: 31 mEq/L (ref 19–32)
Calcium: 9.7 mg/dL (ref 8.4–10.5)
Chloride: 98 mEq/L (ref 96–112)
Creatinine, Ser: 0.87 mg/dL (ref 0.40–1.20)
GFR: 64.57 mL/min (ref >60.00)
Glucose, Bld: 109 mg/dL — ABNORMAL HIGH (ref 70–99)
Potassium: 4.3 mEq/L (ref 3.5–5.1)
Sodium: 137 mEq/L (ref 135–145)
Total Bilirubin: 0.9 mg/dL (ref 0.2–1.2)
Total Protein: 6.5 g/dL (ref 6.0–8.3)

## 2018-03-29 LAB — TSH: TSH: 2.04 u[IU]/mL (ref 0.35–4.50)

## 2018-03-29 NOTE — Assessment & Plan Note (Signed)
Fall onto buttocks - poss hit head/unsure (no head pain) Was immed able to walk  No spinous process tenderness except over coccyx (mild) and nl rom of hips  Nl gait today  Disc fall prev (walker use when fatigued or unsteady or in pain)  Donut pillow prn  Assistance when needed

## 2018-03-29 NOTE — Assessment & Plan Note (Signed)
Takes gabapentin now (lyrica still on list)-prefers it but is more $ Usually does not use walker

## 2018-03-29 NOTE — Assessment & Plan Note (Signed)
Daughter thinks she is more anxious with age  Pt denies this Continues paxil 30 mg daily  Offered to inc to 50- declined for now  Infrequent xanax (aware of fall risk)  Continue to follow

## 2018-03-29 NOTE — Assessment & Plan Note (Signed)
More lately  Suggested naps if needed  May be age related at this point  Unsure if occ confusion/memory problems are related Urine cx from last visit was neg for infx (keep watching this) Suggested better fluid intake/will monitor  Labs today  Did rev last B12 and D levels-which were normal

## 2018-03-29 NOTE — Progress Notes (Signed)
Subjective:    Patient ID: Michelle Gregory, female    DOB: [birth year], 84 y.o.   MRN: [id number]  HPI Follow up for fall/memory loss   Wt Readings from Last 3 Encounters:  03/29/18 120 lb 12 oz (54.8 kg)  12/06/17 118 lb (53.5 kg)  11/03/17 119 lb (54 kg)   23.19 kg/m   Saw Dr Darnell Level on Friday  Presented with nausea and loose stools and mild abd discomfort Also anx   Lives alone In home nurse comes 2 nights per week   Takes gabapentin for pain issues (cheaper than lyrica but still on her list)  paxil 30 mg qhs  Alprazolam for breakhru anx  UA showed bili and trace ketones and tr blood and pos protein and large leuk  (has pessary and poss not clean catch)  Neg urine cx so did not start (cipro)  Pt states she drinks 8 (16 oz) bottles of water per day  Also some peach drink    bp is stable today  No cp or palpitations or headaches or edema  No side effects to medicines  BP Readings from Last 3 Encounters:  03/29/18 118/62  03/25/18 112/66  12/06/17 128/84   Lab Results  Component Value Date   CREATININE 0.50 10/20/2017   BUN 9 10/19/2017   NA 136 10/19/2017   K 3.8 10/19/2017   CL 103 10/19/2017   CO2 27 10/19/2017      Fell Saturday night  Went down on her butt  Thinks she tripped on something  Does not remember dizziness -no warning  Was walking to dryer  Now she is very sore - "on my tail"  Was able to walk  Daughter have her a hydrocodone   Not steady on a bad day  Has chronic back problems     Seems to fall more when she has utis   A little more confused lately  Worse when she is anxious  Daughter thinks she is more anxious when than she lets on    Patient Active Problem List   Diagnosis Date Noted  . Fall at home 03/29/2018  . Anxiety disorder 03/29/2018  . Fatigue 03/29/2018  . Cerumen impaction 12/06/2017  . H/O Clostridium difficile infection 10/13/2017  . Recurrent UTI (urinary tract infection) 09/06/2017  . Auditory hallucinations  03/03/2017  . Weight loss 03/03/2017  . Female cystocele 04/13/2016  . Bowel habit changes 12/30/2015  . Compression fracture 12/30/2015  . Routine general medical examination at a health care facility 04/09/2015  . Candidal intertrigo 03/28/2014  . Encounter for Medicare annual wellness exam 03/21/2013  . Gout 09/19/2012  . Osteopenia 05/05/2011  . Degenerative lumbar disc 01/20/2011  . VARICOSE VEINS, LOWER EXTREMITIES 09/04/2010  . Hyperglycemia 01/08/2010  . Hyperlipidemia 05/09/2008  . Essential hypertension 05/09/2008  . HEMORRHOIDS, INTERNAL 05/09/2008  . Allergic rhinitis 05/09/2008  . Mild reactive airways disease 05/09/2008  . GERD 05/09/2008  . IBS 05/09/2008  . Osteoarthritis 05/09/2008  . History of malignant neoplasm of large intestine 05/09/2008   Past Medical History:  Diagnosis Date  . Allergic rhinitis   . Anxiety   . Cancer ([place])   . Depression   . GERD (gastroesophageal reflux disease)   . History of colon cancer   . Hyperlipidemia   . Hypertension   . Osteoarthritis   . Spinal compression fracture ([place])   . Vertigo    Past Surgical History:  Procedure Laterality Date  . APPENDECTOMY    .  CATARACT EXTRACTION    . COLON RESECTION    . TONSILLECTOMY     Social History   Tobacco Use  . Smoking status: Never Smoker  . Smokeless tobacco: Never Used  Substance Use Topics  . Alcohol use: No    Alcohol/week: 0.0 oz  . Drug use: No   Family History  Problem Relation Age of Onset  . Prostate cancer Father   . Heart attack Mother   . Gout Mother    Allergies  Allergen Reactions  . Amoxicillin-Pot Clavulanate     REACTION: diarrhea  . Keflex [Cephalexin] Diarrhea  . Septra [Sulfamethoxazole-Trimethoprim] Nausea Only   Current Outpatient Medications on File Prior to Visit  Medication Sig Dispense Refill  . allopurinol (ZYLOPRIM) 100 MG tablet TAKE 2 TABLETS BY MOUTH  DAILY 180 tablet 3  . ALPRAZolam (XANAX) 0.5 MG tablet 1/2 pill by mouth  up to twice daily as needed for anxiety 30 tablet 3  . Ascorbic Acid (VITAMIN C) 100 MG tablet Take 100 mg by mouth daily.    Marland Kitchen aspirin 81 MG tablet Take 81 mg by mouth daily.      . calcium-vitamin D (CALCIUM 500 +D) 500 MG tablet Take 1 tablet by mouth daily.     Marland Kitchen CRANBERRY PO Take 1 capsule by mouth 2 (two) times daily.    Marland Kitchen gabapentin (NEURONTIN) 300 MG capsule Take 300 mg by mouth 2 (two) times daily.   6  . glucosamine-chondroitin 500-400 MG tablet Take 1 tablet by mouth daily.    Marland Kitchen HYDROcodone-acetaminophen (NORCO) 10-325 MG tablet Take 1 tablet by mouth every 4 (four) hours as needed for moderate pain. 30 tablet 0  . hydrocortisone (ANUSOL-HC) 25 MG suppository Place 1 suppository (25 mg total) rectally 2 (two) times daily as needed for hemorrhoids or anal itching. 12 suppository 0  . losartan (COZAAR) 100 MG tablet TAKE 1 TABLET BY MOUTH  DAILY 90 tablet 3  . meclizine (ANTIVERT) 25 MG tablet TAKE 1 TABLET (25 MG TOTAL) BY MOUTH 3 (THREE) TIMES  DAILY AS NEEDED. 270 tablet 0  . metoprolol tartrate (LOPRESSOR) 50 MG tablet Take 1 tablet (50 mg total) by mouth 2 (two) times daily. 180 tablet 3  . multivitamin (THERAGRAN) per tablet Take 1 tablet by mouth daily.      Marland Kitchen PARoxetine (PAXIL) 30 MG tablet TAKE 1 TABLET BY MOUTH  EVERY MORNING 90 tablet 3  . pregabalin (LYRICA) 50 MG capsule Take 50 mg by mouth 2 (two) times daily.     . Probiotic Product (PROBIOTIC DAILY PO) Take 1 tablet by mouth daily.    . raloxifene (EVISTA) 60 MG tablet TAKE 1 TABLET BY MOUTH DAILY. 90 tablet 3  . [DISCONTINUED] Glucosamine-Chondroitin 1500-1200 MG/30ML LIQD Take by mouth. As dierected      No current facility-administered medications on file prior to visit.     Review of Systems  Constitutional: Positive for fatigue. Negative for activity change, appetite change, fever and unexpected weight change.  HENT: Positive for hearing loss. Negative for congestion, ear pain, rhinorrhea, sinus pressure and sore  throat.   Eyes: Negative for pain, redness and visual disturbance.  Respiratory: Negative for cough, shortness of breath and wheezing.   Cardiovascular: Negative for chest pain and palpitations.  Gastrointestinal: Negative for abdominal pain, blood in stool, constipation and diarrhea.  Endocrine: Negative for polydipsia and polyuria.  Genitourinary: Positive for urgency. Negative for dysuria, frequency and hematuria.       Baseline urinary urgency  Musculoskeletal: Positive for arthralgias and back pain. Negative for myalgias.  Skin: Negative for pallor and rash.  Allergic/Immunologic: Negative for environmental allergies.  Neurological: Negative for dizziness, seizures, syncope, facial asymmetry, speech difficulty, weakness, light-headedness, numbness and headaches.       Poor balance  Generalized weakness some days  Hematological: Negative for adenopathy. Does not bruise/bleed easily.  Psychiatric/Behavioral: Positive for confusion and decreased concentration. Negative for dysphoric mood and sleep disturbance. The patient is not nervous/anxious.        Occ confusion and poor short term memory (not all the time)        Objective:   Physical Exam  Constitutional: She appears well-developed and well-nourished. No distress.  Well appearing elderly female/somewhat frail   HENT:  Head: Normocephalic and atraumatic.  Right Ear: External ear normal.  Left Ear: External ear normal.  Nose: Nose normal.  Mouth/Throat: Oropharynx is clear and moist.  HOH  Eyes: Pupils are equal, round, and reactive to light. Conjunctivae and EOM are normal. Right eye exhibits no discharge. Left eye exhibits no discharge. No scleral icterus.  Neck: Normal range of motion. Neck supple. No JVD present. Carotid bruit is not present. No thyromegaly present.  Cardiovascular: Normal rate, regular rhythm, normal heart sounds and intact distal pulses. Exam reveals no gallop.  Pulmonary/Chest: Effort normal and breath  sounds normal. No respiratory distress. She has no wheezes. She has no rales.  Abdominal: Soft. Bowel sounds are normal. She exhibits no distension and no mass. There is no tenderness.  Musculoskeletal: She exhibits no edema or tenderness.  Kyphosis  OA joint changes   Mild tenderness over coccyx  No crepitus  No spinous process tenderness Nl rom both hips   Lymphadenopathy:    She has no cervical adenopathy.  Neurological: She is alert. She has normal reflexes. No cranial nerve deficit. She exhibits normal muscle tone. Coordination normal.  Balance is fair  Gait is wide based and shuffling  Skin: Skin is warm and dry. No rash noted. No erythema. No pallor.  fair  Psychiatric: She has a normal mood and affect.  Nl affect  Mentally sharp today          Assessment & Plan:   Problem List Items Addressed This Visit      Cardiovascular and Mediastinum   Essential hypertension    bp in fair control at this time  BP Readings from Last 1 Encounters:  03/29/18 118/62   No changes needed Most recent labs reviewed  Disc lifstyle change with low sodium diet and exercise        Relevant Orders   Comprehensive metabolic panel   CBC with Differential/Platelet   TSH     Musculoskeletal and Integument   Degenerative lumbar disc    Takes gabapentin now (lyrica still on list)-prefers it but is more $ Usually does not use walker         Other   Anxiety disorder    Daughter thinks she is more anxious with age  Pt denies this Continues paxil 30 mg daily  Offered to inc to 81- declined for now  Infrequent xanax (aware of fall risk)  Continue to follow        Auditory hallucinations    No mention of this lately       Fall at home - Primary    Fall onto buttocks - poss hit head/unsure (no head pain) Was immed able to walk  No spinous process tenderness except over coccyx (mild)  and nl rom of hips  Nl gait today  Disc fall prev (walker use when fatigued or unsteady or  in pain)  Donut pillow prn  Assistance when needed       Fatigue    More lately  Suggested naps if needed  May be age related at this point  Unsure if occ confusion/memory problems are related Urine cx from last visit was neg for infx (keep watching this) Suggested better fluid intake/will monitor  Labs today  Did rev last B12 and D levels-which were normal       Relevant Orders   Comprehensive metabolic panel   CBC with Differential/Platelet   TSH

## 2018-03-29 NOTE — Patient Instructions (Addendum)
Get at least 64 oz of water per day  That is very important   Look at your medicine bottles and make sure you are getting 2000 iu of vitamin D per day   Use your walker when sore or when extra tired or balance is off   Labs today for fatigue   Take care of yourself  Eat regular meals

## 2018-03-29 NOTE — Assessment & Plan Note (Signed)
No mention of this lately

## 2018-03-29 NOTE — Assessment & Plan Note (Signed)
bp in fair control at this time  BP Readings from Last 1 Encounters:  03/29/18 118/62   No changes needed Most recent labs reviewed  Disc lifstyle change with low sodium diet and exercise

## 2018-03-31 ENCOUNTER — Encounter (HOSPITAL_BASED_OUTPATIENT_CLINIC_OR_DEPARTMENT_OTHER): Payer: Self-pay

## 2018-03-31 ENCOUNTER — Ambulatory Visit: Payer: Self-pay | Admitting: *Deleted

## 2018-03-31 ENCOUNTER — Emergency Department (HOSPITAL_BASED_OUTPATIENT_CLINIC_OR_DEPARTMENT_OTHER)
Admission: EM | Admit: 2018-03-31 | Discharge: 2018-03-31 | Disposition: A | Discharge status: Home / Self Care | Payer: MEDICARE | Attending: Emergency Medicine | Admitting: Emergency Medicine

## 2018-03-31 ENCOUNTER — Other Ambulatory Visit: Payer: Self-pay

## 2018-03-31 DIAGNOSIS — E860 Dehydration: Secondary | ICD-10-CM | POA: Insufficient documentation

## 2018-03-31 DIAGNOSIS — D696 Thrombocytopenia, unspecified: Secondary | ICD-10-CM | POA: Diagnosis not present

## 2018-03-31 DIAGNOSIS — I10 Essential (primary) hypertension: Secondary | ICD-10-CM | POA: Diagnosis not present

## 2018-03-31 DIAGNOSIS — R441 Visual hallucinations: Secondary | ICD-10-CM | POA: Diagnosis not present

## 2018-03-31 DIAGNOSIS — R443 Hallucinations, unspecified: Secondary | ICD-10-CM

## 2018-03-31 DIAGNOSIS — Z79899 Other long term (current) drug therapy: Secondary | ICD-10-CM | POA: Diagnosis not present

## 2018-03-31 DIAGNOSIS — R531 Weakness: Secondary | ICD-10-CM | POA: Insufficient documentation

## 2018-03-31 DIAGNOSIS — R300 Dysuria: Secondary | ICD-10-CM | POA: Diagnosis present

## 2018-03-31 DIAGNOSIS — Z7982 Long term (current) use of aspirin: Secondary | ICD-10-CM | POA: Insufficient documentation

## 2018-03-31 DIAGNOSIS — Z859 Personal history of malignant neoplasm, unspecified: Secondary | ICD-10-CM | POA: Diagnosis not present

## 2018-03-31 DIAGNOSIS — W19XXXA Unspecified fall, initial encounter: Secondary | ICD-10-CM | POA: Insufficient documentation

## 2018-03-31 HISTORY — DX: Personal history of urinary (tract) infections: Z87.440

## 2018-03-31 LAB — BASIC METABOLIC PANEL
Anion gap: 12 (ref 5–15)
BUN: 26 mg/dL — ABNORMAL HIGH (ref 6–20)
CO2: 24 mmol/L (ref 22–32)
Calcium: 9 mg/dL (ref 8.9–10.3)
Chloride: 98 mmol/L — ABNORMAL LOW (ref 101–111)
Creatinine, Ser: 0.74 mg/dL (ref 0.44–1.00)
GFR calc Af Amer: >60 mL/min (ref >60)
GFR calc non Af Amer: >60 mL/min (ref >60)
Glucose, Bld: 113 mg/dL — ABNORMAL HIGH (ref 65–99)
Potassium: 4.2 mmol/L (ref 3.5–5.1)
Sodium: 134 mmol/L — ABNORMAL LOW (ref 135–145)

## 2018-03-31 LAB — CBC WITH DIFFERENTIAL/PLATELET
Basophils Absolute: 0 10*3/uL (ref 0.0–0.1)
Basophils Relative: 0 %
Eosinophils Absolute: 0.2 10*3/uL (ref 0.0–0.7)
Eosinophils Relative: 2 %
HCT: 37.7 % (ref 36.0–46.0)
Hemoglobin: 13.2 g/dL (ref 12.0–15.0)
Lymphocytes Relative: 9 %
Lymphs Abs: 0.8 10*3/uL (ref 0.7–4.0)
MCH: 33.3 pg (ref 26.0–34.0)
MCHC: 35 g/dL (ref 30.0–36.0)
MCV: 95.2 fL (ref 78.0–100.0)
Monocytes Absolute: 1.1 10*3/uL — ABNORMAL HIGH (ref 0.1–1.0)
Monocytes Relative: 11 %
Neutro Abs: 7.5 10*3/uL (ref 1.7–7.7)
Neutrophils Relative %: 78 %
Platelets: 47 10*3/uL — ABNORMAL LOW (ref 150–400)
RBC: 3.96 MIL/uL (ref 3.87–5.11)
RDW: 11.9 % (ref 11.5–15.5)
WBC: 9.6 10*3/uL (ref 4.0–10.5)

## 2018-03-31 LAB — URINALYSIS, ROUTINE W REFLEX MICROSCOPIC
Bilirubin Urine: NEGATIVE
Glucose, UA: NEGATIVE mg/dL
Ketones, ur: 40 mg/dL — AB
Leukocytes, UA: NEGATIVE
Nitrite: NEGATIVE
Protein, ur: NEGATIVE mg/dL
Specific Gravity, Urine: 1.02 (ref 1.005–1.030)
pH: 6 (ref 5.0–8.0)

## 2018-03-31 LAB — URINALYSIS, MICROSCOPIC (REFLEX): WBC, UA: NONE SEEN WBC/hpf (ref 0–5)

## 2018-03-31 MED ORDER — SODIUM CHLORIDE 0.9 % IV BOLUS
1000.0000 mL | Freq: Once | INTRAVENOUS | Status: AC
  Administered 2018-03-31: 1000 mL via INTRAVENOUS

## 2018-03-31 NOTE — ED Provider Notes (Signed)
[facility] EMERGENCY DEPARTMENT Provider Note   CSN: [id number] Arrival date & time: 03/31/18  1640     History   Chief Complaint Chief Complaint  Patient presents with  . Dysuria    HPI Michelle Gregory is a 84 y.o. female.  HPI Michelle Gregory is a 84 y.o. female with history of anxiety, acid reflux, spinal compression fractures, recurrent UTIs, hypertension, history of C. difficile, presents to emergency department with complaint of weakness and hallucinations.  Patient has not been feeling well for about a week.  She has seen her PCP twice in the last week for the same.  Had a urinalysis done at that grew multiple organisms, thought to be colonizers.  She was not treated for this.  She also had blood work done, CBC and CMP as well as TSH, all unremarkable except for slightly elevated BUN of 31.  According to the family, patient has been feeling weak but not complaining of any particular symptoms.  She had a fall several days ago, when she fell onto her bottom and since then has been sore.  Today she started to have some hallucinations which is why they brought her to emergency department.  Hallucinations come and go.  Currently alert and oriented.  No fever.  No chills.  According to the family not eating or drinking as much.  No nausea or vomiting.  No diarrhea at this time.  Past Medical History:  Diagnosis Date  . Allergic rhinitis   . Anxiety   . Cancer ([place])   . Depression   . GERD (gastroesophageal reflux disease)   . History of colon cancer   . History of recurrent UTIs   . Hyperlipidemia   . Hypertension   . Osteoarthritis   . Spinal compression fracture ([place])   . Vertigo     Patient Active Problem List   Diagnosis Date Noted  . Fall at home 03/29/2018  . Anxiety disorder 03/29/2018  . Fatigue 03/29/2018  . Cerumen impaction 12/06/2017  . H/O Clostridium difficile infection 10/13/2017  . Recurrent UTI (urinary tract infection) 09/06/2017  . Auditory  hallucinations 03/03/2017  . Weight loss 03/03/2017  . Female cystocele 04/13/2016  . Bowel habit changes 12/30/2015  . Compression fracture 12/30/2015  . Routine general medical examination at a health care facility 04/09/2015  . Candidal intertrigo 03/28/2014  . Encounter for Medicare annual wellness exam 03/21/2013  . Gout 09/19/2012  . Osteopenia 05/05/2011  . Degenerative lumbar disc 01/20/2011  . VARICOSE VEINS, LOWER EXTREMITIES 09/04/2010  . Hyperglycemia 01/08/2010  . Hyperlipidemia 05/09/2008  . Essential hypertension 05/09/2008  . HEMORRHOIDS, INTERNAL 05/09/2008  . Allergic rhinitis 05/09/2008  . Mild reactive airways disease 05/09/2008  . GERD 05/09/2008  . IBS 05/09/2008  . Osteoarthritis 05/09/2008  . History of malignant neoplasm of large intestine 05/09/2008    Past Surgical History:  Procedure Laterality Date  . APPENDECTOMY    . CATARACT EXTRACTION    . COLON RESECTION    . TONSILLECTOMY       OB History   None      Home Medications    Prior to Admission medications   Medication Sig Start Date End Date Taking? Authorizing Provider  allopurinol (ZYLOPRIM) 100 MG tablet TAKE 2 TABLETS BY MOUTH  DAILY 06/01/17   Tower, [name], MD  ALPRAZolam Duanne Moron) 0.5 MG tablet 1/2 pill by mouth up to twice daily as needed for anxiety 06/01/17   Tower, [name], MD  Ascorbic  Acid (VITAMIN C) 100 MG tablet Take 100 mg by mouth daily.    [provider]  aspirin 81 MG tablet Take 81 mg by mouth daily.      [provider]  calcium-vitamin D (CALCIUM 500 +D) 500 MG tablet Take 1 tablet by mouth daily.     [provider]  CRANBERRY PO Take 1 capsule by mouth 2 (two) times daily.    [provider]  gabapentin (NEURONTIN) 300 MG capsule Take 300 mg by mouth 2 (two) times daily.  09/06/17   [provider]  glucosamine-chondroitin 500-400 MG tablet Take 1 tablet by mouth daily.    [provider]    HYDROcodone-acetaminophen (NORCO) 10-325 MG tablet Take 1 tablet by mouth every 4 (four) hours as needed for moderate pain. 10/20/17   Henreitta Leber, MD  hydrocortisone (ANUSOL-HC) 25 MG suppository Place 1 suppository (25 mg total) rectally 2 (two) times daily as needed for hemorrhoids or anal itching. 10/20/17   Henreitta Leber, MD  losartan (COZAAR) 100 MG tablet TAKE 1 TABLET BY MOUTH  DAILY 06/01/17   Tower, [name], MD  meclizine (ANTIVERT) 25 MG tablet TAKE 1 TABLET (25 MG TOTAL) BY MOUTH 3 (THREE) TIMES  DAILY AS NEEDED. 12/11/16   Tower, [name], MD  metoprolol tartrate (LOPRESSOR) 50 MG tablet Take 1 tablet (50 mg total) by mouth 2 (two) times daily. 11/03/17   Tower, [name], MD  multivitamin [facility]) per tablet Take 1 tablet by mouth daily.      [provider]  PARoxetine (PAXIL) 30 MG tablet TAKE 1 TABLET BY MOUTH  EVERY MORNING 06/01/17   Tower, [name], MD  pregabalin (LYRICA) 50 MG capsule Take 50 mg by mouth 2 (two) times daily.     [provider]  Probiotic Product (PROBIOTIC DAILY PO) Take 1 tablet by mouth daily.    [provider]  raloxifene (EVISTA) 60 MG tablet TAKE 1 TABLET BY MOUTH DAILY. 06/01/17   Tower, [name], MD  Glucosamine-Chondroitin 1500-1200 MG/30ML LIQD Take by mouth. As dierected   11/02/19  [provider]    Family History Family History  Problem Relation Age of Onset  . Prostate cancer Father   . Heart attack Mother   . Gout Mother     Social History Social History   Tobacco Use  . Smoking status: Never Smoker  . Smokeless tobacco: Never Used  Substance Use Topics  . Alcohol use: No    Alcohol/week: 0.0 oz  . Drug use: No     Allergies   Amoxicillin-pot clavulanate; Keflex [cephalexin]; and Septra [sulfamethoxazole-trimethoprim]   Review of Systems Review of Systems  Constitutional: Positive for fatigue. Negative for chills and fever.  Respiratory: Negative for cough, chest tightness and  shortness of breath.   Cardiovascular: Negative for chest pain, palpitations and leg swelling.  Gastrointestinal: Negative for abdominal pain, diarrhea, nausea and vomiting.  Genitourinary: Negative for dysuria, flank pain, pelvic pain, vaginal bleeding, vaginal discharge and vaginal pain.  Musculoskeletal: Negative for arthralgias, myalgias, neck pain and neck stiffness.  Skin: Negative for rash.  Neurological: Positive for weakness. Negative for dizziness and headaches.  Psychiatric/Behavioral: Positive for confusion and hallucinations.  All other systems reviewed and are negative.    Physical Exam Updated Vital Signs BP (!) 153/67 (BP Location: Right Arm)   Pulse 67   Temp 99 F (37.2 C) (Oral)   Resp 18   Ht 5\' 3"  (1.6 m)  Wt 53.5 kg (118 lb)   LMP 11/02/1980   SpO2 94%   BMI 20.90 kg/m   Physical Exam  Constitutional: She is oriented to person, place, and time. She appears well-developed and well-nourished. No distress.  HENT:  Head: Normocephalic.  Tongue and oral mucosa dry  Eyes: Conjunctivae are normal.  Neck: Neck supple.  Cardiovascular: Normal rate and regular rhythm.  Murmur heard. Pulmonary/Chest: Effort normal and breath sounds normal. No respiratory distress. She has no wheezes. She has no rales.  Abdominal: Soft. Bowel sounds are normal. She exhibits no distension. There is no tenderness. There is no rebound.  Musculoskeletal: She exhibits no edema.  Neurological: She is alert and oriented to person, place, and time. No cranial nerve deficit. Coordination normal.  Skin: Skin is warm and dry.  Psychiatric: She has a normal mood and affect. Her behavior is normal.  Nursing note and vitals reviewed.    ED Treatments / Results  Labs (all labs ordered are listed, but only abnormal results are displayed) Labs Reviewed  CBC WITH DIFFERENTIAL/PLATELET - Abnormal; Notable for the following components:      Result Value   Platelets 47 (*)    Monocytes  Absolute 1.1 (*)    All other components within normal limits  BASIC METABOLIC PANEL - Abnormal; Notable for the following components:   Sodium 134 (*)    Chloride 98 (*)    Glucose, Bld 113 (*)    BUN 26 (*)    All other components within normal limits  URINALYSIS, ROUTINE W REFLEX MICROSCOPIC - Abnormal; Notable for the following components:   Hgb urine dipstick TRACE (*)    Ketones, ur 40 (*)    All other components within normal limits  URINALYSIS, MICROSCOPIC (REFLEX) - Abnormal; Notable for the following components:   Bacteria, UA RARE (*)    All other components within normal limits  URINE CULTURE    EKG None  Radiology No results found.  Procedures Procedures (including critical care time)  Medications Ordered in ED Medications  sodium chloride 0.9 % bolus 1,000 mL (has no administration in time range)     Initial Impression / Assessment and Plan / ED Course  I have reviewed the triage vital signs and the nursing notes.  Pertinent labs & imaging results that were available during my care of the patient were reviewed by me and considered in my medical decision making (see chart for details).     Patient with generalized weakness, decrease in appetite, hallucinations today.  Vital signs unremarkable, blood pressure slightly elevated 532 systolic, exam unremarkable.  Will check urine, will do in and out catheter.  Will check basic labs although patient's lab work was done 2 days ago and was unremarkable.  She did have some signs of dehydration with elevated BUN 2 days ago, will start IV fluids.  6:49 PM And feels much better after IV fluids.  She has not had any hallucinations or confusion while in the emergency department.  Her blood work does show slightly elevated BUN compared to creatinine, but it is improved from 2 days ago.  Her other concerning finding on today's labs his platelets, 47.  It was 101 2 days ago.  Platelets have been normal prior to that.  This  results were discussed with patient and her family, she will need close outpatient follow-up.  At this time there is no evidence of bleeding, I do not think she needs any further emergent work-up or imaging.  Again  she feels well and would like to go home.  I discussed patient with Dr. Laverta Baltimore who has seen her as well.  Will discharge home, continue oral hydration, follow-up with primary care doctor, call tomorrow regarding platelet number and follow-up for close recheck. Return precautions discussed.   Vitals:   03/31/18 1651  BP: (!) 153/67  Pulse: 67  Resp: 18  Temp: 99 F (37.2 C)  TempSrc: Oral  SpO2: 94%  Weight: 53.5 kg (118 lb)  Height: 5\' 3"  (1.6 m)     Final Clinical Impressions(s) / ED Diagnoses   Final diagnoses:  Hallucinations  Weakness  Dehydration  Thrombocytopenia [facility])    ED Discharge Orders    None       Jeannett Senior, PA-C 03/31/18 1853    Margette Fast, MD 04/01/18 [phone]

## 2018-03-31 NOTE — Telephone Encounter (Signed)
Pt has 30' appt to see Glenda Chroman FNP 04/01/18 at [facility].

## 2018-03-31 NOTE — Discharge Instructions (Addendum)
Today's blood work show that you are slightly dehydrated.  You received IV fluids for hydration.  Make sure to continue to drink plenty of fluids at home.  Your platelet count was low today.  Please follow-up with family doctor closely for further work-up and recheck of your platelet levels.

## 2018-03-31 NOTE — Telephone Encounter (Signed)
Pt 's daughter called with her mom having hallucinations. She said that she never has them during the day but today she is having them. Pt knows who she is and her birthday but could not remember the president of the [place]. She denies abd, fever or flank pain. She is incont of urine. Flow at [facility] at [facility] notified to see if she needed to just give another urine sample.  Appointment scheduled within 24 hours per protocol. Home care advise given to daughter and advised to take her to urgent care if the symptoms increase before coming for her visit. She voiced understanding.  Reason for Disposition . [1] Can't control passage of urine (i.e., urinary incontinence) AND [2] new onset (< 2 weeks) or worsening  Answer Assessment - Initial Assessment Questions 1. SYMPTOM: "What's the main symptom you're concerned about?" (e.g., frequency, incontinence)     incontince 2. ONSET: "When did the  ________  start?"      today 3. PAIN: "Is there any pain?" If so, ask: "How bad is it?" (Scale: 1-10; mild, moderate, severe)     no 4. CAUSE: "What do you think is causing the symptoms?"     ?UTI 5. OTHER SYMPTOMS: "Do you have any other symptoms?" (e.g., fever, flank pain, blood in urine, pain with urination)     no 6. PREGNANCY: "Is there any chance you are pregnant?" "When was your last menstrual period?"     n/a  Protocols used: URINARY [facility]

## 2018-03-31 NOTE — ED Notes (Signed)
In and out and culture completed by RN Nonie Hoyer

## 2018-03-31 NOTE — ED Triage Notes (Signed)
Daughter states that over the last two days, pt has become increasingly confused, no fevers at home, hx of recurrent UTIs, daughter thinks she is dehydrated

## 2018-04-01 ENCOUNTER — Ambulatory Visit: Payer: MEDICARE | Admitting: Family Medicine

## 2018-04-01 LAB — URINE CULTURE: Culture: NO GROWTH

## 2018-04-02 ENCOUNTER — Emergency Department (HOSPITAL_COMMUNITY): Payer: MEDICARE

## 2018-04-02 ENCOUNTER — Encounter (HOSPITAL_COMMUNITY): Payer: Self-pay | Admitting: *Deleted

## 2018-04-02 ENCOUNTER — Inpatient Hospital Stay (HOSPITAL_COMMUNITY)
Admission: EM | Admit: 2018-04-02 | Discharge: 2018-04-07 | DRG: 551 | Disposition: A | Discharge status: Skilled Nursing Facility | Payer: MEDICARE | Attending: Family Medicine | Admitting: Family Medicine

## 2018-04-02 ENCOUNTER — Other Ambulatory Visit: Payer: Self-pay

## 2018-04-02 DIAGNOSIS — G8929 Other chronic pain: Secondary | ICD-10-CM | POA: Diagnosis present

## 2018-04-02 DIAGNOSIS — M1990 Unspecified osteoarthritis, unspecified site: Secondary | ICD-10-CM | POA: Diagnosis present

## 2018-04-02 DIAGNOSIS — Z888 Allergy status to other drugs, medicaments and biological substances status: Secondary | ICD-10-CM

## 2018-04-02 DIAGNOSIS — F329 Major depressive disorder, single episode, unspecified: Secondary | ICD-10-CM | POA: Diagnosis present

## 2018-04-02 DIAGNOSIS — C189 Malignant neoplasm of colon, unspecified: Secondary | ICD-10-CM | POA: Diagnosis present

## 2018-04-02 DIAGNOSIS — F411 Generalized anxiety disorder: Secondary | ICD-10-CM

## 2018-04-02 DIAGNOSIS — Y92009 Unspecified place in unspecified non-institutional (private) residence as the place of occurrence of the external cause: Secondary | ICD-10-CM

## 2018-04-02 DIAGNOSIS — S299XXA Unspecified injury of thorax, initial encounter: Secondary | ICD-10-CM | POA: Diagnosis not present

## 2018-04-02 DIAGNOSIS — Z87440 Personal history of urinary (tract) infections: Secondary | ICD-10-CM

## 2018-04-02 DIAGNOSIS — N390 Urinary tract infection, site not specified: Secondary | ICD-10-CM | POA: Diagnosis present

## 2018-04-02 DIAGNOSIS — I959 Hypotension, unspecified: Secondary | ICD-10-CM | POA: Diagnosis present

## 2018-04-02 DIAGNOSIS — F05 Delirium due to known physiological condition: Secondary | ICD-10-CM | POA: Diagnosis present

## 2018-04-02 DIAGNOSIS — S32110A Nondisplaced Zone I fracture of sacrum, initial encounter for closed fracture: Secondary | ICD-10-CM | POA: Diagnosis not present

## 2018-04-02 DIAGNOSIS — W010XXA Fall on same level from slipping, tripping and stumbling without subsequent striking against object, initial encounter: Secondary | ICD-10-CM | POA: Diagnosis present

## 2018-04-02 DIAGNOSIS — J45909 Unspecified asthma, uncomplicated: Secondary | ICD-10-CM | POA: Diagnosis present

## 2018-04-02 DIAGNOSIS — S79912A Unspecified injury of left hip, initial encounter: Secondary | ICD-10-CM | POA: Diagnosis not present

## 2018-04-02 DIAGNOSIS — E785 Hyperlipidemia, unspecified: Secondary | ICD-10-CM | POA: Diagnosis present

## 2018-04-02 DIAGNOSIS — Z79899 Other long term (current) drug therapy: Secondary | ICD-10-CM

## 2018-04-02 DIAGNOSIS — G9341 Metabolic encephalopathy: Secondary | ICD-10-CM | POA: Diagnosis present

## 2018-04-02 DIAGNOSIS — F419 Anxiety disorder, unspecified: Secondary | ICD-10-CM | POA: Diagnosis present

## 2018-04-02 DIAGNOSIS — M545 Low back pain: Secondary | ICD-10-CM | POA: Diagnosis not present

## 2018-04-02 DIAGNOSIS — M25552 Pain in left hip: Secondary | ICD-10-CM | POA: Diagnosis not present

## 2018-04-02 DIAGNOSIS — E860 Dehydration: Secondary | ICD-10-CM | POA: Diagnosis present

## 2018-04-02 DIAGNOSIS — S3992XA Unspecified injury of lower back, initial encounter: Secondary | ICD-10-CM | POA: Diagnosis not present

## 2018-04-02 DIAGNOSIS — A0472 Enterocolitis due to Clostridium difficile, not specified as recurrent: Secondary | ICD-10-CM | POA: Diagnosis present

## 2018-04-02 DIAGNOSIS — K219 Gastro-esophageal reflux disease without esophagitis: Secondary | ICD-10-CM | POA: Diagnosis present

## 2018-04-02 DIAGNOSIS — S3210XA Unspecified fracture of sacrum, initial encounter for closed fracture: Principal | ICD-10-CM | POA: Diagnosis present

## 2018-04-02 DIAGNOSIS — D696 Thrombocytopenia, unspecified: Secondary | ICD-10-CM | POA: Diagnosis present

## 2018-04-02 DIAGNOSIS — S3219XA Other fracture of sacrum, initial encounter for closed fracture: Secondary | ICD-10-CM | POA: Diagnosis not present

## 2018-04-02 DIAGNOSIS — I10 Essential (primary) hypertension: Secondary | ICD-10-CM | POA: Diagnosis present

## 2018-04-02 DIAGNOSIS — Z7982 Long term (current) use of aspirin: Secondary | ICD-10-CM

## 2018-04-02 DIAGNOSIS — Z85038 Personal history of other malignant neoplasm of large intestine: Secondary | ICD-10-CM

## 2018-04-02 HISTORY — DX: Enterocolitis due to Clostridium difficile, not specified as recurrent: A04.72

## 2018-04-02 LAB — BASIC METABOLIC PANEL
Anion gap: 12 (ref 5–15)
BUN: 18 mg/dL (ref 6–20)
CO2: 28 mmol/L (ref 22–32)
Calcium: 9.1 mg/dL (ref 8.9–10.3)
Chloride: 99 mmol/L — ABNORMAL LOW (ref 101–111)
Creatinine, Ser: 0.73 mg/dL (ref 0.44–1.00)
GFR calc Af Amer: >60 mL/min (ref >60)
GFR calc non Af Amer: >60 mL/min (ref >60)
Glucose, Bld: 115 mg/dL — ABNORMAL HIGH (ref 65–99)
Potassium: 3.9 mmol/L (ref 3.5–5.1)
Sodium: 139 mmol/L (ref 135–145)

## 2018-04-02 LAB — CBC WITH DIFFERENTIAL/PLATELET
Basophils Absolute: 0 10*3/uL (ref 0.0–0.1)
Basophils Relative: 0 %
Eosinophils Absolute: 0.1 10*3/uL (ref 0.0–0.7)
Eosinophils Relative: 2 %
HCT: 40.6 % (ref 36.0–46.0)
Hemoglobin: 13.7 g/dL (ref 12.0–15.0)
Lymphocytes Relative: 8 %
Lymphs Abs: 0.7 10*3/uL (ref 0.7–4.0)
MCH: 32.2 pg (ref 26.0–34.0)
MCHC: 33.7 g/dL (ref 30.0–36.0)
MCV: 95.5 fL (ref 78.0–100.0)
Monocytes Absolute: 0.9 10*3/uL (ref 0.1–1.0)
Monocytes Relative: 12 %
Neutro Abs: 6.3 10*3/uL (ref 1.7–7.7)
Neutrophils Relative %: 78 %
Platelets: 101 10*3/uL — ABNORMAL LOW (ref 150–400)
RBC: 4.25 MIL/uL (ref 3.87–5.11)
RDW: 12.8 % (ref 11.5–15.5)
WBC: 8 10*3/uL (ref 4.0–10.5)

## 2018-04-02 MED ORDER — FENTANYL CITRATE (PF) 100 MCG/2ML IJ SOLN
25.0000 ug | Freq: Once | INTRAMUSCULAR | Status: DC
Start: 2018-04-02 — End: 2018-04-02

## 2018-04-02 MED ORDER — FENTANYL CITRATE (PF) 100 MCG/2ML IJ SOLN
50.0000 ug | Freq: Once | INTRAMUSCULAR | Status: AC
  Administered 2018-04-02: 50 ug via INTRAVENOUS
  Filled 2018-04-02: qty 2

## 2018-04-02 MED ORDER — SODIUM CHLORIDE 0.9 % IV SOLN
INTRAVENOUS | Status: DC
  Administered 2018-04-02: 20 mL/h via INTRAVENOUS
  Administered 2018-04-03 – 2018-04-06 (×4): via INTRAVENOUS

## 2018-04-02 NOTE — ED Triage Notes (Signed)
EMS states pt fell last week in her home, saw her PMD, did not see any new injuries on x-ray. Today pt is nonambulatory with increased pain. 152/88-86-95% RA Resp 18

## 2018-04-02 NOTE — ED Notes (Signed)
Bed: RN16 Expected date:  Expected time:  Means of arrival:  Comments: Rescu A

## 2018-04-02 NOTE — ED Provider Notes (Signed)
[facility] DEPT Provider Note   CSN: [id number] Arrival date & time: 04/02/18  1623     History   Chief Complaint No chief complaint on file.   HPI Michelle Gregory is a 84 y.o. female.  84 year old female here after a fall about a week ago.  Denies any hip pain.  States this was mechanical fall and there was no loss of consciousness.  Saw her physician for this and was cleared.  Continues to note lower back pain that is worse with ambulation today.  Also notes new weakness in her legs.  No bowel or bladder dysfunction.  No numbness to her perineum.  Symptoms worse with trying to ambulate.  Denies any numbness or tingling to her feet.  No new treatment used prior to arrival.  Does have a prior history of compression fracture in her spine     Past Medical History:  Diagnosis Date  . Allergic rhinitis   . Anxiety   . Cancer ([place])   . Depression   . GERD (gastroesophageal reflux disease)   . History of colon cancer   . History of recurrent UTIs   . Hyperlipidemia   . Hypertension   . Osteoarthritis   . Spinal compression fracture ([place])   . Vertigo     Patient Active Problem List   Diagnosis Date Noted  . Fall at home 03/29/2018  . Anxiety disorder 03/29/2018  . Fatigue 03/29/2018  . Cerumen impaction 12/06/2017  . H/O Clostridium difficile infection 10/13/2017  . Recurrent UTI (urinary tract infection) 09/06/2017  . Auditory hallucinations 03/03/2017  . Weight loss 03/03/2017  . Female cystocele 04/13/2016  . Bowel habit changes 12/30/2015  . Compression fracture 12/30/2015  . Routine general medical examination at a health care facility 04/09/2015  . Candidal intertrigo 03/28/2014  . Encounter for Medicare annual wellness exam 03/21/2013  . Gout 09/19/2012  . Osteopenia 05/05/2011  . Degenerative lumbar disc 01/20/2011  . VARICOSE VEINS, LOWER EXTREMITIES 09/04/2010  . Hyperglycemia 01/08/2010  . Hyperlipidemia 05/09/2008  .  Essential hypertension 05/09/2008  . HEMORRHOIDS, INTERNAL 05/09/2008  . Allergic rhinitis 05/09/2008  . Mild reactive airways disease 05/09/2008  . GERD 05/09/2008  . IBS 05/09/2008  . Osteoarthritis 05/09/2008  . History of malignant neoplasm of large intestine 05/09/2008    Past Surgical History:  Procedure Laterality Date  . APPENDECTOMY    . CATARACT EXTRACTION    . COLON RESECTION    . TONSILLECTOMY       OB History   None      Home Medications    Prior to Admission medications   Medication Sig Start Date End Date Taking? Authorizing Provider  allopurinol (ZYLOPRIM) 100 MG tablet TAKE 2 TABLETS BY MOUTH  DAILY 06/01/17   Tower, [name], MD  ALPRAZolam Duanne Moron) 0.5 MG tablet 1/2 pill by mouth up to twice daily as needed for anxiety 06/01/17   Tower, [name], MD  Ascorbic Acid (VITAMIN C) 100 MG tablet Take 100 mg by mouth daily.    [provider]  aspirin 81 MG tablet Take 81 mg by mouth daily.      [provider]  calcium-vitamin D (CALCIUM 500 +D) 500 MG tablet Take 1 tablet by mouth daily.     [provider]  CRANBERRY PO Take 1 capsule by mouth 2 (two) times daily.    [provider]  gabapentin (NEURONTIN) 300 MG capsule Take 300 mg by mouth 2 (two) times daily.  09/06/17   [provider]  glucosamine-chondroitin 500-400 MG tablet Take 1 tablet by mouth daily.    [provider]  HYDROcodone-acetaminophen (NORCO) 10-325 MG tablet Take 1 tablet by mouth every 4 (four) hours as needed for moderate pain. 10/20/17   Henreitta Leber, MD  hydrocortisone (ANUSOL-HC) 25 MG suppository Place 1 suppository (25 mg total) rectally 2 (two) times daily as needed for hemorrhoids or anal itching. 10/20/17   Henreitta Leber, MD  losartan (COZAAR) 100 MG tablet TAKE 1 TABLET BY MOUTH  DAILY 06/01/17   Tower, [name], MD  meclizine (ANTIVERT) 25 MG tablet TAKE 1 TABLET (25 MG TOTAL) BY MOUTH 3 (THREE) TIMES  DAILY AS NEEDED. 12/11/16    Tower, [name], MD  metoprolol tartrate (LOPRESSOR) 50 MG tablet Take 1 tablet (50 mg total) by mouth 2 (two) times daily. 11/03/17   Tower, [name], MD  multivitamin [facility]) per tablet Take 1 tablet by mouth daily.      [provider]  PARoxetine (PAXIL) 30 MG tablet TAKE 1 TABLET BY MOUTH  EVERY MORNING 06/01/17   Tower, [name], MD  pregabalin (LYRICA) 50 MG capsule Take 50 mg by mouth 2 (two) times daily.     [provider]  Probiotic Product (PROBIOTIC DAILY PO) Take 1 tablet by mouth daily.    [provider]  raloxifene (EVISTA) 60 MG tablet TAKE 1 TABLET BY MOUTH DAILY. 06/01/17   Tower, [name], MD  Glucosamine-Chondroitin 1500-1200 MG/30ML LIQD Take by mouth. As dierected   11/02/19  [provider]    Family History Family History  Problem Relation Age of Onset  . Prostate cancer Father   . Heart attack Mother   . Gout Mother     Social History Social History   Tobacco Use  . Smoking status: Never Smoker  . Smokeless tobacco: Never Used  Substance Use Topics  . Alcohol use: No    Alcohol/week: 0.0 oz  . Drug use: No     Allergies   Amoxicillin-pot clavulanate; Keflex [cephalexin]; and Septra [sulfamethoxazole-trimethoprim]   Review of Systems Review of Systems  All other systems reviewed and are negative.    Physical Exam Updated Vital Signs BP 139/66 (BP Location: Right Arm)   Pulse (!) 114   Temp 97.9 F (36.6 C) (Oral)   Resp 16   Ht 1.6 m (5\' 3" )   Wt 53.5 kg (118 lb)   LMP 11/02/1980   SpO2 93%   BMI 20.90 kg/m   Physical Exam  Constitutional: She is oriented to person, place, and time. She appears well-developed and well-nourished.  Non-toxic appearance. No distress.  HENT:  Head: Normocephalic and atraumatic.  Eyes: Pupils are equal, round, and reactive to light. Conjunctivae, EOM and lids are normal.  Neck: Normal range of motion. Neck supple. No tracheal deviation present. No thyroid mass present.    Cardiovascular: Regular rhythm and normal heart sounds. Tachycardia present. Exam reveals no gallop.  No murmur heard. Pulmonary/Chest: Effort normal and breath sounds normal. No stridor. No respiratory distress. She has no decreased breath sounds. She has no wheezes. She has no rhonchi. She has no rales.  Abdominal: Soft. Normal appearance and bowel sounds are normal. She exhibits no distension. There is no tenderness. There is no rebound and no CVA tenderness.  Musculoskeletal: Normal range of motion. She exhibits no edema or tenderness.       Back:  Neurological: She is alert and oriented to person,  place, and time. She displays no tremor. No cranial nerve deficit or sensory deficit. GCS eye subscore is 4. GCS verbal subscore is 5. GCS motor subscore is 6.  Bilateral upper extremity strength is 5 of 5.  Bilateral lower extremity strength is 3 of 5  Skin: Skin is warm and dry. No abrasion and no rash noted.  Psychiatric: She has a normal mood and affect. Her speech is normal and behavior is normal.  Nursing note and vitals reviewed.    ED Treatments / Results  Labs (all labs ordered are listed, but only abnormal results are displayed) Labs Reviewed  CBC WITH DIFFERENTIAL/PLATELET  BASIC METABOLIC PANEL    EKG None  Radiology No results found.  Procedures Procedures (including critical care time)  Medications Ordered in ED Medications  0.9 %  sodium chloride infusion (has no administration in time range)  fentaNYL (SUBLIMAZE) injection 50 mcg (has no administration in time range)     Initial Impression / Assessment and Plan / ED Course  I have reviewed the triage vital signs and the nursing notes.  Pertinent labs & imaging results that were available during my care of the patient were reviewed by me and considered in my medical decision making (see chart for details).     When patient presented initially she complained of having lower back pain.  CT of thoracic lumbar  spine without acute findings.  Upon further speaking with patient, she said her pain really was in her left hip.  She had pain with range of motion.  Plain x-rays negative and attempted an MRI without success.  Patient cannot lay flat also patient has a bladder device that is metallic.  Patient will have a CT of her hip to rule out fracture.  If this is negative, and she will need to be seen by case management for home health versus nursing home placement.  Patient signed out to Dr. Christy Gentles  Final Clinical Impressions(s) / ED Diagnoses   Final diagnoses:  None    ED Discharge Orders    None       Lacretia Leigh, MD 04/02/18 2330

## 2018-04-03 ENCOUNTER — Other Ambulatory Visit: Payer: Self-pay

## 2018-04-03 DIAGNOSIS — S3210XA Unspecified fracture of sacrum, initial encounter for closed fracture: Secondary | ICD-10-CM | POA: Diagnosis not present

## 2018-04-03 DIAGNOSIS — I10 Essential (primary) hypertension: Secondary | ICD-10-CM

## 2018-04-03 DIAGNOSIS — S3219XA Other fracture of sacrum, initial encounter for closed fracture: Secondary | ICD-10-CM | POA: Diagnosis not present

## 2018-04-03 DIAGNOSIS — F419 Anxiety disorder, unspecified: Secondary | ICD-10-CM

## 2018-04-03 MED ORDER — ACETAMINOPHEN 650 MG RE SUPP: 650.0000 mg | Freq: Four times a day (QID) | RECTAL | Status: DC | PRN

## 2018-04-03 MED ORDER — FENTANYL CITRATE (PF) 100 MCG/2ML IJ SOLN
50.0000 ug | Freq: Once | INTRAMUSCULAR | Status: AC
  Administered 2018-04-03: 50 ug via INTRAVENOUS
  Filled 2018-04-03: qty 2

## 2018-04-03 MED ORDER — FENTANYL CITRATE (PF) 100 MCG/2ML IJ SOLN
50.0000 ug | Freq: Once | INTRAMUSCULAR | Status: AC
Start: 2018-04-03 — End: 2018-04-03
  Administered 2018-04-03: 50 ug via INTRAVENOUS
  Filled 2018-04-03: qty 2

## 2018-04-03 MED ORDER — METOPROLOL TARTRATE 50 MG PO TABS
50.0000 mg | ORAL_TABLET | Freq: Two times a day (BID) | ORAL | Status: DC
  Administered 2018-04-03 – 2018-04-07 (×10): 50 mg via ORAL
  Filled 2018-04-03 (×4): qty 1
  Filled 2018-04-03: qty 2
  Filled 2018-04-03 (×4): qty 1

## 2018-04-03 MED ORDER — ASPIRIN EC 81 MG PO TBEC
81.0000 mg | DELAYED_RELEASE_TABLET | Freq: Every day | ORAL | Status: DC
  Administered 2018-04-03 – 2018-04-07 (×5): 81 mg via ORAL
  Filled 2018-04-03 (×5): qty 1

## 2018-04-03 MED ORDER — ACETAMINOPHEN 325 MG PO TABS
650.0000 mg | ORAL_TABLET | Freq: Four times a day (QID) | ORAL | Status: DC | PRN
  Administered 2018-04-07: 650 mg via ORAL
  Filled 2018-04-03: qty 2

## 2018-04-03 MED ORDER — LOSARTAN POTASSIUM 50 MG PO TABS
100.0000 mg | ORAL_TABLET | Freq: Every day | ORAL | Status: DC
  Administered 2018-04-03 – 2018-04-07 (×5): 100 mg via ORAL
  Filled 2018-04-03 (×5): qty 2

## 2018-04-03 MED ORDER — DOCUSATE SODIUM 100 MG PO CAPS
100.0000 mg | ORAL_CAPSULE | Freq: Two times a day (BID) | ORAL | Status: DC
  Administered 2018-04-03 – 2018-04-06 (×6): 100 mg via ORAL
  Filled 2018-04-03 (×7): qty 1

## 2018-04-03 MED ORDER — GABAPENTIN 300 MG PO CAPS
300.0000 mg | ORAL_CAPSULE | Freq: Two times a day (BID) | ORAL | Status: DC
  Administered 2018-04-03 – 2018-04-07 (×10): 300 mg via ORAL
  Filled 2018-04-03 (×9): qty 1

## 2018-04-03 MED ORDER — POLYETHYLENE GLYCOL 3350 17 G PO PACK: 17.0000 g | PACK | Freq: Every day | ORAL | Status: DC | PRN

## 2018-04-03 MED ORDER — SENNA 8.6 MG PO TABS
1.0000 | ORAL_TABLET | Freq: Two times a day (BID) | ORAL | Status: DC
  Administered 2018-04-03 – 2018-04-06 (×6): 8.6 mg via ORAL
  Filled 2018-04-03 (×7): qty 1

## 2018-04-03 MED ORDER — HYDROCODONE-ACETAMINOPHEN 5-325 MG PO TABS
1.0000 | ORAL_TABLET | ORAL | Status: DC | PRN
  Administered 2018-04-03 – 2018-04-07 (×14): 1 via ORAL
  Filled 2018-04-03 (×14): qty 1

## 2018-04-03 MED ORDER — ALPRAZOLAM 0.25 MG PO TABS
0.2500 mg | ORAL_TABLET | Freq: Two times a day (BID) | ORAL | Status: DC | PRN
  Administered 2018-04-03 – 2018-04-07 (×6): 0.25 mg via ORAL
  Filled 2018-04-03 (×6): qty 1

## 2018-04-03 MED ORDER — METHYLPREDNISOLONE SODIUM SUCC 125 MG IJ SOLR
60.0000 mg | Freq: Once | INTRAMUSCULAR | Status: AC
  Administered 2018-04-03: 60 mg via INTRAVENOUS
  Filled 2018-04-03: qty 2

## 2018-04-03 MED ORDER — PAROXETINE HCL 20 MG PO TABS
30.0000 mg | ORAL_TABLET | Freq: Every morning | ORAL | Status: DC
  Administered 2018-04-03 – 2018-04-07 (×5): 30 mg via ORAL
  Filled 2018-04-03 (×5): qty 1

## 2018-04-03 MED ORDER — MORPHINE SULFATE (PF) 2 MG/ML IV SOLN
2.0000 mg | INTRAVENOUS | Status: DC | PRN
  Administered 2018-04-03: 2 mg via INTRAVENOUS
  Filled 2018-04-03: qty 1

## 2018-04-03 NOTE — Progress Notes (Signed)
[facility] contacted for low bed for patient

## 2018-04-03 NOTE — Plan of Care (Signed)
Patient remains confused, speaking to her mother and father, talking with other family members who are not present.   Continues to try and get out of the bed.  Sitter at bedside.

## 2018-04-03 NOTE — H&P (Signed)
History and Physical    Michelle Gregory ZDG:[id number] DOB: 1922-11-17 DOA: 04/02/2018  PCP: Abner Greenspan, MD Patient coming from: Home  Chief Complaint: Pain  HPI: Michelle Gregory is a 84 y.o. female with medical history significant of hypertension, anxiety, hyperlipidemia, reactive airway disease, GERD, osteoarthritis, colon cancer, C. difficile infection.  Patient presented to the emergency department secondary to worsening back pain.  Patient is confused and is unable to give a full history.  History is obtained from patient, family, chart review.  Patient suffered a fall few weeks ago and was seen at her primary care physician's office and cleared.  However, her pain worsened and she presented to the emergency department for evaluation.  Per her daughter, patient used to walk on her own.  She has a walker, but rarely uses it.  Patient lives alone.  Fall was apparently from a slip and fall, with no loss of consciousness.  ED Course: Vitals: Afebrile, normal variable pulses from 52-114, normal respirations, hypertensive in 140-150s/80s-90s, on room air Labs: Platelets of 101 Imaging: CT hip significant for acute nondisplaced bilateral sacral fractures. Medications/Course: IV fluids, Xanax, gabapentin, Fentanyl and Norco given  Review of Systems: Review of Systems  Unable to perform ROS: Dementia  Constitutional: Negative for chills and fever.  Respiratory: Negative for shortness of breath.   Cardiovascular: Negative for chest pain and palpitations.  Gastrointestinal: Negative for abdominal pain, constipation, diarrhea, nausea and vomiting.  Musculoskeletal: Positive for back pain and falls.    Past Medical History:  Diagnosis Date  . Allergic rhinitis   . Anxiety   . C. difficile diarrhea 10/2017  . Cancer ([place])   . Depression   . GERD (gastroesophageal reflux disease)   . History of colon cancer   . History of recurrent UTIs   . Hyperlipidemia   . Hypertension   .  Osteoarthritis   . Spinal compression fracture ([place])   . Vertigo     Past Surgical History:  Procedure Laterality Date  . APPENDECTOMY    . CATARACT EXTRACTION    . COLON RESECTION    . TONSILLECTOMY       reports that she has never smoked. She has never used smokeless tobacco. She reports that she does not drink alcohol or use drugs.  Allergies  Allergen Reactions  . Amoxicillin-Pot Clavulanate Diarrhea    Has patient had a PCN reaction causing immediate rash, facial/tongue/throat swelling, SOB or lightheadedness with hypotension: Yes Has patient had a PCN reaction causing severe rash involving mucus membranes or skin necrosis: No Has patient had a PCN reaction that required hospitalization: No Has patient had a PCN reaction occurring within the last 10 years: No If all of the above answers are "NO", then may proceed with Cephalosporin use.   Marland Kitchen Keflex [Cephalexin] Diarrhea  . Septra [Sulfamethoxazole-Trimethoprim] Nausea Only    Family History  Problem Relation Age of Onset  . Prostate cancer Father   . Heart attack Mother   . Gout Mother     Prior to Admission medications   Medication Sig Start Date End Date Taking? Authorizing Provider  allopurinol (ZYLOPRIM) 100 MG tablet TAKE 2 TABLETS BY MOUTH  DAILY 06/01/17  Yes Tower, [name], MD  ALPRAZolam Duanne Moron) 0.5 MG tablet 1/2 pill by mouth up to twice daily as needed for anxiety 06/01/17  Yes Tower, [name], MD  Ascorbic Acid (VITAMIN C) 100 MG tablet Take 100 mg by mouth daily.   Yes [provider]  aspirin 81 MG tablet Take 81 mg by mouth daily.     Yes [provider]  calcium-vitamin D (CALCIUM 500 +D) 500 MG tablet Take 1 tablet by mouth daily.    Yes [provider]  CRANBERRY PO Take 1 capsule by mouth 2 (two) times daily.   Yes [provider]  gabapentin (NEURONTIN) 300 MG capsule Take 300 mg by mouth 2 (two) times daily.  09/06/17  Yes [provider]    glucosamine-chondroitin 500-400 MG tablet Take 1 tablet by mouth daily.   Yes [provider]  HYDROcodone-acetaminophen (NORCO) 10-325 MG tablet Take 1 tablet by mouth every 4 (four) hours as needed for moderate pain. 10/20/17  Yes Henreitta Leber, MD  losartan (COZAAR) 100 MG tablet TAKE 1 TABLET BY MOUTH  DAILY 06/01/17  Yes Tower, [name], MD  meclizine (ANTIVERT) 25 MG tablet TAKE 1 TABLET (25 MG TOTAL) BY MOUTH 3 (THREE) TIMES  DAILY AS NEEDED. 12/11/16  Yes Tower, [name], MD  metoprolol tartrate (LOPRESSOR) 50 MG tablet Take 1 tablet (50 mg total) by mouth 2 (two) times daily. 11/03/17  Yes Tower, [name], MD  multivitamin [facility]) per tablet Take 1 tablet by mouth daily.     Yes [provider]  PARoxetine (PAXIL) 30 MG tablet TAKE 1 TABLET BY MOUTH  EVERY MORNING 06/01/17  Yes Tower, [name], MD  Probiotic Product (PROBIOTIC DAILY PO) Take 1 tablet by mouth daily.   Yes [provider]  pregabalin (LYRICA) 50 MG capsule Take 50 mg by mouth 2 (two) times daily.     [provider]  raloxifene (EVISTA) 60 MG tablet TAKE 1 TABLET BY MOUTH DAILY. Patient not taking: Reported on 04/02/2018 06/01/17   Tower, [name], MD  Glucosamine-Chondroitin 1500-1200 MG/30ML LIQD Take by mouth. As dierected   11/02/19  [provider]    Physical Exam: Vitals:   04/03/18 0600 04/03/18 1013 04/03/18 1200 04/03/18 1236  BP: (!) 167/92 (!) 144/97 (!) 152/86 (!) 152/86  Pulse:  77 96 (!) 52  Resp:  20  18  Temp:      TempSrc:      SpO2: 94% 97% 95% 94%  Weight:      Height:       Physical Exam  Constitutional: She appears well-developed and well-nourished. No distress.  HENT:  Mouth/Throat: Oropharynx is clear and moist.  Eyes: Pupils are equal, round, and reactive to light. Conjunctivae and EOM are normal.  Neck: Normal range of motion.  Cardiovascular: Normal rate, regular rhythm, normal heart sounds and normal pulses.  Extrasystoles are present.  No  murmur heard. Pulmonary/Chest: Effort normal and breath sounds normal. No respiratory distress. She has no wheezes. She has no rales.  Abdominal: Soft. Bowel sounds are normal. She exhibits no distension. There is no tenderness. There is no rebound and no guarding.  Musculoskeletal: Normal range of motion. She exhibits no edema or tenderness.  Lymphadenopathy:    She has no cervical adenopathy.  Neurological: She is alert. She is disoriented (oriented to self only).  4/5 upper extremity strength. Lower extremity strength limited by pain.  Skin: Skin is warm and dry. She is not diaphoretic.  Psychiatric: She has a normal mood and affect. Her speech is normal and behavior is normal. Thought content normal. She is actively hallucinating. Cognition and memory are impaired.     Labs on Admission: I have personally reviewed following labs and imaging studies  CBC: Recent Labs  Lab 03/29/18 1443 03/31/18 1716 04/02/18 1754  WBC 8.0 9.6 8.0  NEUTROABS 5.5 7.5 6.3  HGB 13.0 13.2 13.7  HCT 38.4 37.7 40.6  MCV 98.0 95.2 95.5  PLT 101.0* 47* 950*   Basic Metabolic Panel: Recent Labs  Lab 03/29/18 1443 03/31/18 1716 04/02/18 1754  NA 137 134* 139  K 4.3 4.2 3.9  CL 98 98* 99*  CO2 31 24 28   GLUCOSE 109* 113* 115*  BUN 31* 26* 18  CREATININE 0.87 0.74 0.73  CALCIUM 9.7 9.0 9.1   GFR: Estimated Creatinine Clearance: 36.3 mL/min (by C-G formula based on SCr of 0.73 mg/dL). Liver Function Tests: Recent Labs  Lab 03/29/18 1443  AST 17  ALT 11  ALKPHOS 63  BILITOT 0.9  PROT 6.5  ALBUMIN 3.8   Urine analysis:    Component Value Date/Time   COLORURINE YELLOW 03/31/2018 [place] 03/31/2018 1716   LABSPEC 1.020 03/31/2018 1716   PHURINE 6.0 03/31/2018 1716   GLUCOSEU NEGATIVE 03/31/2018 1716   HGBUR TRACE (A) 03/31/2018 1716   HGBUR trace-lysed 12/12/2010 [place] 03/31/2018 1716   BILIRUBINUR 1+ 03/25/2018 1520   KETONESUR 40 (A)  03/31/2018 1716   PROTEINUR NEGATIVE 03/31/2018 1716   UROBILINOGEN 1.0 03/25/2018 1520   UROBILINOGEN 0.2 12/12/2010 1509   NITRITE NEGATIVE 03/31/2018 1716   LEUKOCYTESUR NEGATIVE 03/31/2018 1716    Recent Results (from the past 240 hour(s))  Urine Culture     Status: None   Collection Time: 03/25/18  3:28 PM  Result Value Ref Range Status   MICRO NUMBER: [id number]  Final   SPECIMEN QUALITY: ADEQUATE  Final   Sample Source URINE  Final   STATUS: FINAL  Final   Result:   Final    Multiple organisms present, each less than 10,000 CFU/mL. These organisms, commonly found on external and internal genitalia, are considered to be colonizers. No further testing performed.  Urine culture     Status: None   Collection Time: 03/31/18  5:53 PM  Result Value Ref Range Status   Specimen Description   Final    URINE, CATHETERIZED Performed at [facility], [address] [place], [place] [address]    Special Requests   Final    NONE Performed at [facility], [address] [place], [place] [address]    Culture   Final    NO GROWTH Performed at [facility], [place] [address] [place], [place] [address]    Report Status 04/01/2018 FINAL  Final     Radiological Exams on Admission: Ct Thoracic Spine Wo Contrast  Result Date: 04/02/2018 CLINICAL DATA:  84 year old who fell last week at home, presenting today not able to ambulate, a new finding. Personal history of multiple thoracic compression fractures and a prior L2 compression fracture with augmentation. CT requested to determine if there are acute fractures accounting for the new symptoms. EXAM: CT THORACIC SPINE WITHOUT CONTRAST TECHNIQUE: Multidetector CT images of the thoracic were obtained using the standard protocol without intravenous contrast. COMPARISON:  Bone window images from CT abdomen and pelvis 10/13/2017. Thoracic spine x-rays 11/07/2010. No interval thoracic spine imaging in the [facility].  FINDINGS: Alignment: Anatomic POSTERIOR alignment with the exception of retropulsion of the bone fragment at T9 related to a remote fracture. Vertebrae: Remote T9 compression fracture nearing 100% with retropulsion of the POSTERIOR bone fragment and ANTERIOR displacement of the ANTERIOR bone fragment, unchanged  in appearance since the CT bone window images in December, 2018. The compression fracture of the UPPER endplate of H84 on the order of 60% or so is unchanged dating back to the 2012 x-rays. No new/acute thoracic spine compression fractures. Generalized osseous demineralization as noted previously. Paraspinal and other soft tissues: No paraspinous or canal hematoma. Disc levels: Well-preserved disc spaces throughout the thoracic spine. Mild spinal stenosis at the T9 level as result of the retropulsed bone fragment. No evidence of spinal stenosis elsewhere. Note is made of degenerative disc disease and spondylosis at the C5-6 level. IMPRESSION: 1. No acute thoracic spine compression fractures. 2. Remote compression fractures of T9 and T12, unchanged since the prior CT in December, 2018. Electronically Signed   By: Evangeline Dakin M.D.   On: 04/02/2018 18:01   Ct Lumbar Spine Wo Contrast  Result Date: 04/02/2018 CLINICAL DATA:  84 year old who fell last week at home, presenting today not able to ambulate, a new finding. Personal history of multiple thoracic compression fractures and a prior L2 compression fracture with augmentation. CT requested to determine if there are acute fractures accounting for the new symptoms. EXAM: CT LUMBAR SPINE WITHOUT CONTRAST TECHNIQUE: Multidetector CT imaging of the lumbar spine was performed without intravenous contrast administration. Multiplanar CT image reconstructions were also generated. COMPARISON:  Bone window images from CT abdomen and pelvis 10/13/2017 and earlier. MRI lumbar spine 02/24/2011. FINDINGS: Segmentation: 5 non-rib-bearing lumbar vertebrae. Alignment:  Anatomic POSTERIOR alignment from L3 through S1. Likely as a result of the L2 fracture and the severe facet degenerative changes there is grade 1-2 spondylolisthesis at L2-3 measuring approximately 11 mm, unchanged from the most recent prior CT in December, 2018. Vertebrae: Osseous demineralization. Compression fracture of the L2 vertebral body nearly 100%, with prior augmentation and retropulsion of the POSTERIOR fragment, unchanged since December, 2018. No new/acute lumbar compression fractures. Paraspinal and other soft tissues: No paraspinous or canal hematoma. Disc levels: Severe multifactorial spinal stenosis at L2-3, due to the retropulsed bone fragment and severe facet degenerative changes. Moderate multifactorial spinal stenosis at L3-4 and L4-5. IMPRESSION: 1. No acute lumbar spine compression fractures. 2. Remote L2 compression fracture with augmentation, unchanged since December, 2018. 3. Grade 1-2 spondylolisthesis of L2 on L3 measuring approximately 11 mm, also unchanged. 4. Multilevel multifactorial spinal stenosis, greatest at L2-3. Electronically Signed   By: Evangeline Dakin M.D.   On: 04/02/2018 18:11   Ct Hip Left Wo Contrast  Addendum Date: 04/03/2018   ADDENDUM REPORT: 04/03/2018 10:43 ADDENDUM: This addendum is given as the patient has nondisplaced bilateral sacral fractures which were not described in the initially dictated report. The fractures are acute. Electronically Signed   By: Inge Rise M.D.   On: 04/03/2018 10:43   Result Date: 04/03/2018 CLINICAL DATA:  Status post fall last week, with left hip pain. Initial encounter. EXAM: CT OF THE LEFT HIP WITHOUT CONTRAST TECHNIQUE: Multidetector CT imaging of the left hip was performed according to the standard protocol. Multiplanar CT image reconstructions were also generated. COMPARISON:  CT of the abdomen and pelvis performed 10/13/2017, and left hip radiographs performed earlier today at 8:23 p.m. FINDINGS: Bones/Joint/Cartilage  There is no evidence of fracture or dislocation. The proximal left femur appears intact. The left femoral head remains seated at the acetabulum. The visualized portions of the left sacroiliac joint and pubic symphysis are grossly unremarkable. There is mild osteopenia of visualized osseous structures. No hip joint effusion is seen. The cartilage is not well assessed on CT.  Ligaments Suboptimally assessed by CT. Muscles and Tendons The visualized musculature is unremarkable in appearance. No focal tendon abnormalities are seen. Soft tissues No significant soft tissue hematoma is identified. The patient's surgical device at the pelvis appears laterally tilted in comparison to prior studies. Presacral stranding is nonspecific in appearance. This may reflect sequelae of prior proctitis. IMPRESSION: 1. No evidence of fracture or dislocation. 2. Surgical device at the pelvis appears laterally tilted in comparison to prior studies. Would correlate clinically for expected positioning. 3. Presacral stranding is nonspecific in appearance. This may reflect sequelae of prior proctitis. 4. Mild osteopenia of visualized osseous structures. Electronically Signed: By: Garald Balding M.D. On: 04/03/2018 00:36   Dg Hip Unilat W Or Wo Pelvis 2-3 Views Left  Result Date: 04/02/2018 CLINICAL DATA:  Fall last week.  Worsening left hip pain. EXAM: DG HIP (WITH OR WITHOUT PELVIS) 2-3V LEFT COMPARISON:  10/13/2017 CT abdomen/pelvis. FINDINGS: No pelvic fracture or diastasis. No left hip fracture or dislocation. No suspicious focal osseous lesions. Mild degenerative changes in the weight-bearing portions of both hip joints. Degenerative changes in the visualized lower lumbar spine. Surgical suture line overlies the upper sacrum. Stable metallic device overlying the pubic symphysis. IMPRESSION: No fracture.  No left hip malalignment. Electronically Signed   By: Ilona Sorrel M.D.   On: 04/02/2018 20:45    Assessment/Plan Principal  Problem:   Sacral fracture (HCC) Active Problems:   Hyperlipidemia   Essential hypertension   Anxiety disorder   Bilateral sacral fractures Secondary to slip and fall.  Patient unable to bear weight secondary to pain.  Patient required IV narcotics in the emergency department. -Tylenol, Norco, morphine for pain -Physical therapy -Orthopedic surgery recommendations pending  Thrombocytopenia No evidence of bleeding.  Appears reactive.  Trending up from yesterday. -Repeat CBC in a.m.  Essential hypertension Uncontrolled. -Continue metoprolol and losartan -Hydralazine PRN  Chronic pain -Continue gabapentin and home Norco  Anxiety -Continue home Paxil and Xanax as needed  Memory impairment No official diagnosis of dementia.  Per discussion with daughter, patient has sundowning symptoms often.  History of auditory hallucinations.  Currently exhibiting symptoms of sundowning. -Continue Xanax as needed from patient's home medications   DVT prophylaxis: SCDs Code Status: Full code Family Communication: Daughter on telephone Disposition Plan: Discharge likely home tomorrow, unless her specific insurance will cover SNF stay (CM to recheck with social work) Consults called: Orthopedic surgery by EDP (Reconsulted at time of admission) Admission status: Observation, medical floor   Cordelia Poche, MD [facility] Pager [phone]  If 7PM-7AM, please contact night-coverage [email or web address] Password [email or web address]  04/03/2018, 2:05 PM

## 2018-04-03 NOTE — ED Notes (Signed)
Pt family request that she receive a pain pill for her hip pain.

## 2018-04-03 NOTE — Evaluation (Signed)
Physical Therapy Evaluation Patient Details Name: Michelle Gregory MRN: [id number] DOB: Nov 14, 1922 Today's Date: 04/03/2018   History of Present Illness  Pt admitted with confusion and back/L hip pain with hx of fall ~ 1 wk ago.   Bilat nondisplaced sacral fx found on second review of CT scan  Clinical Impression  Pt admitted as above and presenting with functional mobility severely limited by confusion, balance deficits and PAIN with movement.  Pt would benefit from follow up rehab at SNF level to maximize IND and safety prior to return home.    Follow Up Recommendations SNF    Equipment Recommendations  None recommended by PT    Recommendations for Other Services       Precautions / Restrictions Precautions Precautions: Fall Restrictions Weight Bearing Restrictions: No      Mobility  Bed Mobility Overal bed mobility: Needs Assistance Bed Mobility: Supine to Sit;Sit to Supine     Supine to sit: Mod assist;+2 for physical assistance;+2 for safety/equipment Sit to supine: Mod assist;+2 for physical assistance;+2 for safety/equipment   General bed mobility comments: ++ encouragement required to gain partipation with pt ltd by pain with movement  Transfers Overall transfer level: Needs assistance Equipment used: Rolling walker (2 wheeled) Transfers: Sit to/from [facility] Sit to Stand: Min assist;Mod assist;+2 physical assistance;+2 safety/equipment;From elevated surface Stand pivot transfers: Mod assist;+2 physical assistance;+2 safety/equipment       General transfer comment: Physical assist to bring wt up and fwd and to complete transfers to and from comode,  Pt very anxious and unsafe with onset of pain and with difficulty following cues for safe completion of task  Ambulation/Gait             General Gait Details: bed<>BSC transfers only - pain limited  Stairs            Wheelchair Mobility    Modified Rankin (Stroke Patients Only)        Balance Overall balance assessment: Needs assistance Sitting-balance support: Bilateral upper extremity supported;Feet supported Sitting balance-Leahy Scale: Fair     Standing balance support: Bilateral upper extremity supported Standing balance-Leahy Scale: Poor                               Pertinent Vitals/Pain Pain Assessment: Faces Faces Pain Scale: Hurts whole lot Pain Location: my leg Pain Descriptors / Indicators: Grimacing;Guarding Pain Intervention(s): Limited activity within patient's tolerance;Monitored during session;Premedicated before session    Home Living Family/patient expects to be discharged to:: Unsure                      Prior Function Level of Independence: Independent         Comments: Indep with ADLs, household activities without assist device; sponge bathes primarily, but son/daughter in law assist with full bath as needed.       Hand Dominance        Extremity/Trunk Assessment   Upper Extremity Assessment Upper Extremity Assessment: Difficult to assess due to impaired cognition    Lower Extremity Assessment Lower Extremity Assessment: LLE deficits/detail    Cervical / Trunk Assessment Cervical / Trunk Assessment: Kyphotic  Communication   Communication: HOH  Cognition Arousal/Alertness: Awake/alert Behavior During Therapy: Impulsive;Anxious Overall Cognitive Status: Impaired/Different from baseline Area of Impairment: Safety/judgement;Following commands;Problem solving  Following Commands: Follows one step commands inconsistently;Follows one step commands with increased time Safety/Judgement: Decreased awareness of safety   Problem Solving: Slow processing        General Comments      Exercises     Assessment/Plan    PT Assessment Patient needs continued PT services  PT Problem List Decreased range of motion;Decreased activity tolerance;Decreased  balance;Decreased mobility;Decreased cognition;Decreased knowledge of use of DME;Decreased safety awareness;Decreased knowledge of precautions;Pain       PT Treatment Interventions DME instruction;Gait training;Functional mobility training;Therapeutic activities;Therapeutic exercise;Balance training;Patient/family education;Cognitive remediation    PT Goals (Current goals can be found in the Care Plan section)  Acute Rehab PT Goals Patient Stated Goal: hurt less PT Goal Formulation: Patient unable to participate in goal setting Time For Goal Achievement: 04/17/18 Potential to Achieve Goals: Fair    Frequency Min 3X/week   Barriers to discharge        Co-evaluation               AM-PAC PT "6 Clicks" Daily Activity  Outcome Measure Difficulty turning over in bed (including adjusting bedclothes, sheets and blankets)?: Unable Difficulty moving from lying on back to sitting on the side of the bed? : Unable Difficulty sitting down on and standing up from a chair with arms (e.g., wheelchair, bedside commode, etc,.)?: Unable Help needed moving to and from a bed to chair (including a wheelchair)?: A Lot Help needed walking in hospital room?: A Lot Help needed climbing 3-5 steps with a railing? : Total 6 Click Score: 8    End of Session Equipment Utilized During Treatment: Gait belt Activity Tolerance: Patient limited by pain Patient left: in bed;with call bell/phone within reach;with family/visitor present Nurse Communication: Mobility status PT Visit Diagnosis: Difficulty in walking, not elsewhere classified (R26.2);History of falling (Z91.81);Unsteadiness on feet (R26.81);Pain Pain - part of body: Leg    Time: 6553-7482 PT Time Calculation (min) (ACUTE ONLY): 32 min   Charges:   PT Evaluation $PT Eval Moderate Complexity: 1 Mod PT Treatments $Therapeutic Activity: 8-22 mins   PT G Codes:        Pg [phone]   Aden Sek 04/03/2018, 3:53 PM

## 2018-04-03 NOTE — Clinical Social Work Note (Signed)
CSW received consult for "placement." Patient with altered mental status at this time. CSW met with patient's daughter-[name] Coble and daughter's husband outside of patient's room. Patient from home alone, but daughter and other family live within 2 minutes of each other. Daughter assists patient at least 3x daily with ADLs. Patient also has a CNA through [facility] that assists patient in the home twice per week for 2 hours. CSW explained 3 night inpatient qualifying stay requirement for Medicare to pay for rehab and asked if patient/family can pay out of pocket. Daughter stated no. CSW explained home health P/T can be arranged. Daughter prefers [employer] for [employer]. Daughter states patient doesn't need any additional equipment. CSW updated EDRN Patty, RNCM [place], and EDP. Daughter wanted to know if [place] would require 30 days of private pay upfront. CSW confirmed with April in admissions for [facility] that 30 days would be required and the remainder reimbursed for days not used. CSW signing off as no further Social Work needs identified, but remains available as needed.   Oretha Ellis, [place], [place] Weekend Coverage-ED Social Worker [phone]

## 2018-04-03 NOTE — ED Notes (Signed)
Pt family at bedside. Pt attempts to get out of bed and doesn't understand why she is unable to get up. Family keeps enforcing that pt must stay in bed. Pt in NAD

## 2018-04-03 NOTE — ED Provider Notes (Signed)
I discussed the patient's CT imaging with our radiologist as the patient had persistent pain, after signout this morning. On reevaluation the patient seems to have bilateral sacral fractures. Patient did have some pain improvement first with Percocet, then with fentanyl, but given her decreased ambulatory capacity, after many discussions with social work, physical therapy, family, hospitalist, the patient will be admitted for observation, facilitation of physical therapy. Patient's case also discussed with orthopedics, Dr. Mardelle Matte, and they will consult on the patient's care.   Carmin Muskrat, MD 04/03/18 [phone]

## 2018-04-03 NOTE — Care Management Note (Signed)
Case Management Note  Patient Details  Name: Michelle Gregory MRN: [id number] Date of Birth: 06-18-1923  Subjective/Objective:       Left hip pain             Action/Plan: NCM spoke to pt and dtr at bedside. Offered choice for HH/list provided. Pt has an aide that comes from [place] 2x per week. Has RW and wheelchair at home. Waiting PT recommendations.    Expected Discharge Date:                  Expected Discharge Plan:  [place]  In-House Referral:  Clinical Social Work  Discharge planning Services  CM Consult  Post Acute Care Choice:  Home Health Choice offered to:  Adult Children  DME Arranged:  N/A DME Agency:  NA  HH Arranged:  PT, OT, Nurse's Aide [place] Agency:     Status of Service:  In process, will continue to follow  If discussed at Long Length of Stay Meetings, dates discussed:    Additional Comments:  Erenest Rasher, RN 04/03/2018, 12:00 PM

## 2018-04-03 NOTE — ED Notes (Signed)
Pt daughter contact information: [name] [phone] Home [phone] cell

## 2018-04-03 NOTE — ED Provider Notes (Signed)
I assumed care in sign out to follow-up with CT imaging.  CT hip is negative for acute fracture.  She has no focal abdominal tenderness.  Pelvis stable.  I can range the left hip and she has mild pain.  She reports most of her pain is in her left buttock, there is no erythema or obvious abscess. Plan is to consult case management/social work.  Home meds ordered. family is going home, but would like to be called later in the morning.   Ripley Fraise, MD 04/03/18 [phone]

## 2018-04-03 NOTE — ED Notes (Signed)
ED TO INPATIENT HANDOFF REPORT  Name/Age/Gender Michelle Gregory 84 y.o. female  Code Status Code Status History    Date Active Date Inactive Code Status Order ID Comments User Context   10/13/2017 1713 10/20/2017 1459 Full Code [id number]  Demetrios Loll, MD Inpatient      Home/SNF/Other Home  Chief Complaint Lt. hip pain  Level of Care/Admitting Diagnosis ED Disposition    ED Disposition Condition [facility] Area: [facility] [[id number]]  Level of Care: Med-Surg [16]  Diagnosis: Sacral fracture [facility]) [[id number]]  Admitting Physician: Mariel Aloe [phone]  Attending Physician: Mariel Aloe [phone]  PT Class (Do Not Modify): Observation [104]  PT Acc Code (Do Not Modify): Observation [10022]       Medical History Past Medical History:  Diagnosis Date  . Allergic rhinitis   . Anxiety   . C. difficile diarrhea 10/2017  . Cancer ([place])   . Depression   . GERD (gastroesophageal reflux disease)   . History of colon cancer   . History of recurrent UTIs   . Hyperlipidemia   . Hypertension   . Osteoarthritis   . Spinal compression fracture ([place])   . Vertigo     Allergies Allergies  Allergen Reactions  . Amoxicillin-Pot Clavulanate Diarrhea    Has patient had a PCN reaction causing immediate rash, facial/tongue/throat swelling, SOB or lightheadedness with hypotension: Yes Has patient had a PCN reaction causing severe rash involving mucus membranes or skin necrosis: No Has patient had a PCN reaction that required hospitalization: No Has patient had a PCN reaction occurring within the last 10 years: No If all of the above answers are "NO", then may proceed with Cephalosporin use.   Marland Kitchen Keflex [Cephalexin] Diarrhea  . Septra [Sulfamethoxazole-Trimethoprim] Nausea Only    IV Location/Drains/Wounds Patient Lines/Drains/Airways Status   Active Line/Drains/Airways    Name:   Placement date:   Placement time:   Site:   Days:   Peripheral IV  04/02/18 Left Antecubital   04/02/18    1753    Antecubital   1          Labs/Imaging Results for orders placed or performed during the hospital encounter of 04/02/18 (from the past 48 hour(s))  CBC with Differential/Platelet     Status: Abnormal   Collection Time: 04/02/18  5:54 PM  Result Value Ref Range   WBC 8.0 4.0 - 10.5 K/uL   RBC 4.25 3.87 - 5.11 MIL/uL   Hemoglobin 13.7 12.0 - 15.0 g/dL   HCT 40.6 36.0 - 46.0 %   MCV 95.5 78.0 - 100.0 fL   MCH 32.2 26.0 - 34.0 pg   MCHC 33.7 30.0 - 36.0 g/dL   RDW 12.8 11.5 - 15.5 %   Platelets 101 (L) 150 - 400 K/uL    Comment: RESULT REPEATED AND VERIFIED SPECIMEN CHECKED FOR CLOTS PLATELET COUNT CONFIRMED BY SMEAR    Neutrophils Relative % 78 %   Neutro Abs 6.3 1.7 - 7.7 K/uL   Lymphocytes Relative 8 %   Lymphs Abs 0.7 0.7 - 4.0 K/uL   Monocytes Relative 12 %   Monocytes Absolute 0.9 0.1 - 1.0 K/uL   Eosinophils Relative 2 %   Eosinophils Absolute 0.1 0.0 - 0.7 K/uL   Basophils Relative 0 %   Basophils Absolute 0.0 0.0 - 0.1 K/uL    Comment: Performed at [facility], [place] [address] [place], [place] [address]  Basic metabolic panel  Status: Abnormal   Collection Time: 04/02/18  5:54 PM  Result Value Ref Range   Sodium 139 135 - 145 mmol/L   Potassium 3.9 3.5 - 5.1 mmol/L   Chloride 99 (L) 101 - 111 mmol/L   CO2 28 22 - 32 mmol/L   Glucose, Bld 115 (H) 65 - 99 mg/dL   BUN 18 6 - 20 mg/dL   Creatinine, Ser 0.73 0.44 - 1.00 mg/dL   Calcium 9.1 8.9 - 10.3 mg/dL   GFR calc non Af Amer >60 >60 mL/min   GFR calc Af Amer >60 >60 mL/min    Comment: (NOTE) The eGFR has been calculated using the CKD EPI equation. This calculation has not been validated in all clinical situations. eGFR's persistently <60 mL/min signify possible Chronic Kidney Disease.    Anion gap 12 5 - 15    Comment: Performed at [facility], [place] [address] [place], [place] [address]   Ct Thoracic Spine Wo  Contrast  Result Date: 04/02/2018 CLINICAL DATA:  83 year old who fell last week at home, presenting today not able to ambulate, a new finding. Personal history of multiple thoracic compression fractures and a prior L2 compression fracture with augmentation. CT requested to determine if there are acute fractures accounting for the new symptoms. EXAM: CT THORACIC SPINE WITHOUT CONTRAST TECHNIQUE: Multidetector CT images of the thoracic were obtained using the standard protocol without intravenous contrast. COMPARISON:  Bone window images from CT abdomen and pelvis 10/13/2017. Thoracic spine x-rays 11/07/2010. No interval thoracic spine imaging in the [facility]. FINDINGS: Alignment: Anatomic POSTERIOR alignment with the exception of retropulsion of the bone fragment at T9 related to a remote fracture. Vertebrae: Remote T9 compression fracture nearing 100% with retropulsion of the POSTERIOR bone fragment and ANTERIOR displacement of the ANTERIOR bone fragment, unchanged in appearance since the CT bone window images in December, 2018. The compression fracture of the UPPER endplate of S01 on the order of 60% or so is unchanged dating back to the 2012 x-rays. No new/acute thoracic spine compression fractures. Generalized osseous demineralization as noted previously. Paraspinal and other soft tissues: No paraspinous or canal hematoma. Disc levels: Well-preserved disc spaces throughout the thoracic spine. Mild spinal stenosis at the T9 level as result of the retropulsed bone fragment. No evidence of spinal stenosis elsewhere. Note is made of degenerative disc disease and spondylosis at the C5-6 level. IMPRESSION: 1. No acute thoracic spine compression fractures. 2. Remote compression fractures of T9 and T12, unchanged since the prior CT in December, 2018. Electronically Signed   By: Evangeline Dakin M.D.   On: 04/02/2018 18:01   Ct Lumbar Spine Wo Contrast  Result Date: 04/02/2018 CLINICAL DATA:  84 year old  who fell last week at home, presenting today not able to ambulate, a new finding. Personal history of multiple thoracic compression fractures and a prior L2 compression fracture with augmentation. CT requested to determine if there are acute fractures accounting for the new symptoms. EXAM: CT LUMBAR SPINE WITHOUT CONTRAST TECHNIQUE: Multidetector CT imaging of the lumbar spine was performed without intravenous contrast administration. Multiplanar CT image reconstructions were also generated. COMPARISON:  Bone window images from CT abdomen and pelvis 10/13/2017 and earlier. MRI lumbar spine 02/24/2011. FINDINGS: Segmentation: 5 non-rib-bearing lumbar vertebrae. Alignment: Anatomic POSTERIOR alignment from L3 through S1. Likely as a result of the L2 fracture and the severe facet degenerative changes there is grade 1-2 spondylolisthesis at L2-3 measuring approximately 11 mm, unchanged from the most recent prior CT  in December, 2018. Vertebrae: Osseous demineralization. Compression fracture of the L2 vertebral body nearly 100%, with prior augmentation and retropulsion of the POSTERIOR fragment, unchanged since December, 2018. No new/acute lumbar compression fractures. Paraspinal and other soft tissues: No paraspinous or canal hematoma. Disc levels: Severe multifactorial spinal stenosis at L2-3, due to the retropulsed bone fragment and severe facet degenerative changes. Moderate multifactorial spinal stenosis at L3-4 and L4-5. IMPRESSION: 1. No acute lumbar spine compression fractures. 2. Remote L2 compression fracture with augmentation, unchanged since December, 2018. 3. Grade 1-2 spondylolisthesis of L2 on L3 measuring approximately 11 mm, also unchanged. 4. Multilevel multifactorial spinal stenosis, greatest at L2-3. Electronically Signed   By: Evangeline Dakin M.D.   On: 04/02/2018 18:11   Ct Hip Left Wo Contrast  Addendum Date: 04/03/2018   ADDENDUM REPORT: 04/03/2018 10:43 ADDENDUM: This addendum is given as the  patient has nondisplaced bilateral sacral fractures which were not described in the initially dictated report. The fractures are acute. Electronically Signed   By: Inge Rise M.D.   On: 04/03/2018 10:43   Result Date: 04/03/2018 CLINICAL DATA:  Status post fall last week, with left hip pain. Initial encounter. EXAM: CT OF THE LEFT HIP WITHOUT CONTRAST TECHNIQUE: Multidetector CT imaging of the left hip was performed according to the standard protocol. Multiplanar CT image reconstructions were also generated. COMPARISON:  CT of the abdomen and pelvis performed 10/13/2017, and left hip radiographs performed earlier today at 8:23 p.m. FINDINGS: Bones/Joint/Cartilage There is no evidence of fracture or dislocation. The proximal left femur appears intact. The left femoral head remains seated at the acetabulum. The visualized portions of the left sacroiliac joint and pubic symphysis are grossly unremarkable. There is mild osteopenia of visualized osseous structures. No hip joint effusion is seen. The cartilage is not well assessed on CT. Ligaments Suboptimally assessed by CT. Muscles and Tendons The visualized musculature is unremarkable in appearance. No focal tendon abnormalities are seen. Soft tissues No significant soft tissue hematoma is identified. The patient's surgical device at the pelvis appears laterally tilted in comparison to prior studies. Presacral stranding is nonspecific in appearance. This may reflect sequelae of prior proctitis. IMPRESSION: 1. No evidence of fracture or dislocation. 2. Surgical device at the pelvis appears laterally tilted in comparison to prior studies. Would correlate clinically for expected positioning. 3. Presacral stranding is nonspecific in appearance. This may reflect sequelae of prior proctitis. 4. Mild osteopenia of visualized osseous structures. Electronically Signed: By: Garald Balding M.D. On: 04/03/2018 00:36   Dg Hip Unilat W Or Wo Pelvis 2-3 Views Left  Result  Date: 04/02/2018 CLINICAL DATA:  Fall last week.  Worsening left hip pain. EXAM: DG HIP (WITH OR WITHOUT PELVIS) 2-3V LEFT COMPARISON:  10/13/2017 CT abdomen/pelvis. FINDINGS: No pelvic fracture or diastasis. No left hip fracture or dislocation. No suspicious focal osseous lesions. Mild degenerative changes in the weight-bearing portions of both hip joints. Degenerative changes in the visualized lower lumbar spine. Surgical suture line overlies the upper sacrum. Stable metallic device overlying the pubic symphysis. IMPRESSION: No fracture.  No left hip malalignment. Electronically Signed   By: Ilona Sorrel M.D.   On: 04/02/2018 20:45    Pending Labs Unresulted Labs (From admission, onward)   Start     Ordered   Signed and Held  CBC  Tomorrow morning,   R     Signed and Held      Vitals/Pain Today's Vitals   04/03/18 0600 04/03/18 1013 04/03/18 1200 04/03/18 1236  BP: (!) 167/92 (!) 144/97 (!) 152/86 (!) 152/86  Pulse:  77 96 (!) 52  Resp:  20  18  Temp:      TempSrc:      SpO2: 94% 97% 95% 94%  Weight:      Height:      PainSc:        Isolation Precautions No active isolations  Medications Medications  0.9 %  sodium chloride infusion ( Intravenous Stopped 04/02/18 2342)  ALPRAZolam (XANAX) tablet 0.25 mg (0.25 mg Oral Given 04/03/18 1349)  aspirin EC tablet 81 mg (81 mg Oral Given 04/03/18 0924)  gabapentin (NEURONTIN) capsule 300 mg (300 mg Oral Given 04/03/18 0929)  losartan (COZAAR) tablet 100 mg (100 mg Oral Given 04/03/18 0925)  metoprolol tartrate (LOPRESSOR) tablet 50 mg (50 mg Oral Given 04/03/18 0929)  PARoxetine (PAXIL) tablet 30 mg (30 mg Oral Given 04/03/18 0925)  HYDROcodone-acetaminophen (NORCO/VICODIN) 5-325 MG per tablet 1 tablet (1 tablet Oral Given 04/03/18 0936)  fentaNYL (SUBLIMAZE) injection 50 mcg (50 mcg Intravenous Given 04/02/18 1756)  fentaNYL (SUBLIMAZE) injection 50 mcg (50 mcg Intravenous Given 04/02/18 2142)  methylPREDNISolone sodium succinate (SOLU-MEDROL) 125 mg/2  mL injection 60 mg (60 mg Intravenous Given 04/03/18 0200)  fentaNYL (SUBLIMAZE) injection 50 mcg (50 mcg Intravenous Given 04/03/18 0201)  fentaNYL (SUBLIMAZE) injection 50 mcg (50 mcg Intravenous Given 04/03/18 1350)    Mobility non-ambulatory

## 2018-04-03 NOTE — Progress Notes (Addendum)
Called from ED regarding hip pain, CT scan demonstrated a nondisplaced bilateral sacral fracture.  Films and chart reviewed.  Okay to be weightbearing as tolerated from my standpoint, it would be challenging to do anything less, but she should use a walker for support when getting up with physical therapy.  Plan for close follow-up with me in 1 week, full consult to follow in the morning.  Johnny Bridge, MD

## 2018-04-03 NOTE — ED Notes (Signed)
Pt placed in hospital bed for comfort. Pt provided with breakfast tray and is feeding herself. Pt is confused and talks to people that aren't in the room. Pt reassured that she is safe.

## 2018-04-04 ENCOUNTER — Observation Stay (HOSPITAL_COMMUNITY): Payer: MEDICARE

## 2018-04-04 ENCOUNTER — Telehealth: Payer: Self-pay | Admitting: Family Medicine

## 2018-04-04 DIAGNOSIS — Z85038 Personal history of other malignant neoplasm of large intestine: Secondary | ICD-10-CM | POA: Diagnosis not present

## 2018-04-04 DIAGNOSIS — J45909 Unspecified asthma, uncomplicated: Secondary | ICD-10-CM | POA: Diagnosis present

## 2018-04-04 DIAGNOSIS — C189 Malignant neoplasm of colon, unspecified: Secondary | ICD-10-CM | POA: Diagnosis present

## 2018-04-04 DIAGNOSIS — Z4789 Encounter for other orthopedic aftercare: Secondary | ICD-10-CM | POA: Diagnosis not present

## 2018-04-04 DIAGNOSIS — F329 Major depressive disorder, single episode, unspecified: Secondary | ICD-10-CM | POA: Diagnosis present

## 2018-04-04 DIAGNOSIS — Z7982 Long term (current) use of aspirin: Secondary | ICD-10-CM | POA: Diagnosis not present

## 2018-04-04 DIAGNOSIS — S32110A Nondisplaced Zone I fracture of sacrum, initial encounter for closed fracture: Secondary | ICD-10-CM | POA: Diagnosis not present

## 2018-04-04 DIAGNOSIS — Z87440 Personal history of urinary (tract) infections: Secondary | ICD-10-CM | POA: Diagnosis not present

## 2018-04-04 DIAGNOSIS — M1000 Idiopathic gout, unspecified site: Secondary | ICD-10-CM | POA: Diagnosis not present

## 2018-04-04 DIAGNOSIS — M6281 Muscle weakness (generalized): Secondary | ICD-10-CM | POA: Diagnosis not present

## 2018-04-04 DIAGNOSIS — I959 Hypotension, unspecified: Secondary | ICD-10-CM | POA: Diagnosis present

## 2018-04-04 DIAGNOSIS — F05 Delirium due to known physiological condition: Secondary | ICD-10-CM | POA: Diagnosis present

## 2018-04-04 DIAGNOSIS — Y92009 Unspecified place in unspecified non-institutional (private) residence as the place of occurrence of the external cause: Secondary | ICD-10-CM | POA: Diagnosis not present

## 2018-04-04 DIAGNOSIS — N390 Urinary tract infection, site not specified: Secondary | ICD-10-CM | POA: Diagnosis present

## 2018-04-04 DIAGNOSIS — R278 Other lack of coordination: Secondary | ICD-10-CM | POA: Diagnosis not present

## 2018-04-04 DIAGNOSIS — S3210XA Unspecified fracture of sacrum, initial encounter for closed fracture: Secondary | ICD-10-CM | POA: Diagnosis not present

## 2018-04-04 DIAGNOSIS — I10 Essential (primary) hypertension: Secondary | ICD-10-CM | POA: Diagnosis not present

## 2018-04-04 DIAGNOSIS — G9341 Metabolic encephalopathy: Secondary | ICD-10-CM | POA: Diagnosis present

## 2018-04-04 DIAGNOSIS — Z888 Allergy status to other drugs, medicaments and biological substances status: Secondary | ICD-10-CM | POA: Diagnosis not present

## 2018-04-04 DIAGNOSIS — R410 Disorientation, unspecified: Secondary | ICD-10-CM | POA: Diagnosis not present

## 2018-04-04 DIAGNOSIS — K219 Gastro-esophageal reflux disease without esophagitis: Secondary | ICD-10-CM | POA: Diagnosis present

## 2018-04-04 DIAGNOSIS — F419 Anxiety disorder, unspecified: Secondary | ICD-10-CM | POA: Diagnosis not present

## 2018-04-04 DIAGNOSIS — D696 Thrombocytopenia, unspecified: Secondary | ICD-10-CM | POA: Diagnosis present

## 2018-04-04 DIAGNOSIS — R2689 Other abnormalities of gait and mobility: Secondary | ICD-10-CM | POA: Diagnosis not present

## 2018-04-04 DIAGNOSIS — M545 Low back pain: Secondary | ICD-10-CM | POA: Diagnosis not present

## 2018-04-04 DIAGNOSIS — Z79899 Other long term (current) drug therapy: Secondary | ICD-10-CM | POA: Diagnosis not present

## 2018-04-04 DIAGNOSIS — E860 Dehydration: Secondary | ICD-10-CM | POA: Diagnosis present

## 2018-04-04 DIAGNOSIS — E785 Hyperlipidemia, unspecified: Secondary | ICD-10-CM | POA: Diagnosis present

## 2018-04-04 DIAGNOSIS — Z9181 History of falling: Secondary | ICD-10-CM | POA: Diagnosis not present

## 2018-04-04 DIAGNOSIS — G8929 Other chronic pain: Secondary | ICD-10-CM | POA: Diagnosis present

## 2018-04-04 DIAGNOSIS — S3210XD Unspecified fracture of sacrum, subsequent encounter for fracture with routine healing: Secondary | ICD-10-CM | POA: Diagnosis not present

## 2018-04-04 DIAGNOSIS — R1312 Dysphagia, oropharyngeal phase: Secondary | ICD-10-CM | POA: Diagnosis not present

## 2018-04-04 DIAGNOSIS — M1990 Unspecified osteoarthritis, unspecified site: Secondary | ICD-10-CM | POA: Diagnosis present

## 2018-04-04 DIAGNOSIS — W010XXA Fall on same level from slipping, tripping and stumbling without subsequent striking against object, initial encounter: Secondary | ICD-10-CM | POA: Diagnosis present

## 2018-04-04 DIAGNOSIS — A0472 Enterocolitis due to Clostridium difficile, not specified as recurrent: Secondary | ICD-10-CM | POA: Diagnosis present

## 2018-04-04 LAB — URINALYSIS, ROUTINE W REFLEX MICROSCOPIC
Bilirubin Urine: NEGATIVE
Bilirubin Urine: NEGATIVE
Glucose, UA: NEGATIVE mg/dL
Glucose, UA: NEGATIVE mg/dL
Ketones, ur: NEGATIVE mg/dL
Ketones, ur: NEGATIVE mg/dL
Nitrite: NEGATIVE
Nitrite: NEGATIVE
Protein, ur: 30 mg/dL — AB
Protein, ur: NEGATIVE mg/dL
Specific Gravity, Urine: 1.011 (ref 1.005–1.030)
Specific Gravity, Urine: 1.012 (ref 1.005–1.030)
pH: 6 (ref 5.0–8.0)
pH: 5 (ref 5.0–8.0)

## 2018-04-04 LAB — CBC
HCT: 39.5 % (ref 36.0–46.0)
Hemoglobin: 13.6 g/dL (ref 12.0–15.0)
MCH: 32.2 pg (ref 26.0–34.0)
MCHC: 34.4 g/dL (ref 30.0–36.0)
MCV: 93.6 fL (ref 78.0–100.0)
Platelets: 145 10*3/uL — ABNORMAL LOW (ref 150–400)
RBC: 4.22 MIL/uL (ref 3.87–5.11)
RDW: 12.8 % (ref 11.5–15.5)
WBC: 16.5 10*3/uL — ABNORMAL HIGH (ref 4.0–10.5)

## 2018-04-04 LAB — PROCALCITONIN: Procalcitonin: <0.1 ng/mL

## 2018-04-04 MED ORDER — ENOXAPARIN SODIUM 40 MG/0.4ML ~~LOC~~ SOLN
40.0000 mg | SUBCUTANEOUS | Status: DC
  Administered 2018-04-04 – 2018-04-06 (×3): 40 mg via SUBCUTANEOUS
  Filled 2018-04-04 (×3): qty 0.4

## 2018-04-04 MED ORDER — SODIUM CHLORIDE 0.9 % IV SOLN
1.0000 g | INTRAVENOUS | Status: DC
  Administered 2018-04-04 – 2018-04-06 (×3): 1 g via INTRAVENOUS
  Filled 2018-04-04: qty 10
  Filled 2018-04-04: qty 1
  Filled 2018-04-04: qty 10
  Filled 2018-04-04: qty 1

## 2018-04-04 NOTE — Consult Note (Addendum)
Reason for Consult:Sacral fxs Referring Physician: C Natori [name] is an 84 y.o. female.  HPI: Michelle Gregory suffered a fall a few weeks ago and was c/o left hip pain. She saw her PCP who evaluated her and didn't find any fxs. She has had increasing back pain came to the ED where a CT showed bilateral sacral fxs and orthopedic surgery was consulted. She denies any hip pain at this point. She was not especially cooperative with history or exam as she just wanted to be left alone to sleep.  Past Medical History:  Diagnosis Date  . Allergic rhinitis   . Anxiety   . C. difficile diarrhea 10/2017  . Cancer ([place])   . Depression   . GERD (gastroesophageal reflux disease)   . History of colon cancer   . History of recurrent UTIs   . Hyperlipidemia   . Hypertension   . Osteoarthritis   . Spinal compression fracture ([place])   . Vertigo     Past Surgical History:  Procedure Laterality Date  . APPENDECTOMY    . CATARACT EXTRACTION    . COLON RESECTION    . TONSILLECTOMY      Family History  Problem Relation Age of Onset  . Prostate cancer Father   . Heart attack Mother   . Gout Mother     Social History:  reports that she has never smoked. She has never used smokeless tobacco. She reports that she does not drink alcohol or use drugs.  Allergies:  Allergies  Allergen Reactions  . Amoxicillin-Pot Clavulanate Diarrhea    Has patient had a PCN reaction causing immediate rash, facial/tongue/throat swelling, SOB or lightheadedness with hypotension: Yes Has patient had a PCN reaction causing severe rash involving mucus membranes or skin necrosis: No Has patient had a PCN reaction that required hospitalization: No Has patient had a PCN reaction occurring within the last 10 years: No If all of the above answers are "NO", then may proceed with Cephalosporin use.   Marland Kitchen Keflex [Cephalexin] Diarrhea  . Septra [Sulfamethoxazole-Trimethoprim] Nausea Only    Medications: I have reviewed the  patient's current medications.  Results for orders placed or performed during the hospital encounter of 04/02/18 (from the past 48 hour(s))  CBC with Differential/Platelet     Status: Abnormal   Collection Time: 04/02/18  5:54 PM  Result Value Ref Range   WBC 8.0 4.0 - 10.5 K/uL   RBC 4.25 3.87 - 5.11 MIL/uL   Hemoglobin 13.7 12.0 - 15.0 g/dL   HCT 40.6 36.0 - 46.0 %   MCV 95.5 78.0 - 100.0 fL   MCH 32.2 26.0 - 34.0 pg   MCHC 33.7 30.0 - 36.0 g/dL   RDW 12.8 11.5 - 15.5 %   Platelets 101 (L) 150 - 400 K/uL    Comment: RESULT REPEATED AND VERIFIED SPECIMEN CHECKED FOR CLOTS PLATELET COUNT CONFIRMED BY SMEAR    Neutrophils Relative % 78 %   Neutro Abs 6.3 1.7 - 7.7 K/uL   Lymphocytes Relative 8 %   Lymphs Abs 0.7 0.7 - 4.0 K/uL   Monocytes Relative 12 %   Monocytes Absolute 0.9 0.1 - 1.0 K/uL   Eosinophils Relative 2 %   Eosinophils Absolute 0.1 0.0 - 0.7 K/uL   Basophils Relative 0 %   Basophils Absolute 0.0 0.0 - 0.1 K/uL    Comment: Performed at [facility], [place] [address] [place], [place] [address]  Basic metabolic panel  Status: Abnormal   Collection Time: 04/02/18  5:54 PM  Result Value Ref Range   Sodium 139 135 - 145 mmol/L   Potassium 3.9 3.5 - 5.1 mmol/L   Chloride 99 (L) 101 - 111 mmol/L   CO2 28 22 - 32 mmol/L   Glucose, Bld 115 (H) 65 - 99 mg/dL   BUN 18 6 - 20 mg/dL   Creatinine, Ser 0.73 0.44 - 1.00 mg/dL   Calcium 9.1 8.9 - 10.3 mg/dL   GFR calc non Af Amer >60 >60 mL/min   GFR calc Af Amer >60 >60 mL/min    Comment: (NOTE) The eGFR has been calculated using the CKD EPI equation. This calculation has not been validated in all clinical situations. eGFR's persistently <60 mL/min signify possible Chronic Kidney Disease.    Anion gap 12 5 - 15    Comment: Performed at [facility], [place] [address], [place], [place] [address]  Urinalysis, Routine w reflex microscopic     Status: Abnormal   Collection Time: 04/04/18   5:55 AM  Result Value Ref Range   Color, Urine YELLOW YELLOW   APPearance CLEAR CLEAR   Specific Gravity, Urine 1.011 1.005 - 1.030   pH 6.0 5.0 - 8.0   Glucose, UA NEGATIVE NEGATIVE mg/dL   Hgb urine dipstick SMALL (A) NEGATIVE   Bilirubin Urine NEGATIVE NEGATIVE   Ketones, ur NEGATIVE NEGATIVE mg/dL   Protein, ur 30 (A) NEGATIVE mg/dL   Nitrite NEGATIVE NEGATIVE   Leukocytes, UA LARGE (A) NEGATIVE   RBC / HPF 0-5 0 - 5 RBC/hpf   WBC, UA 0-5 0 - 5 WBC/hpf   Bacteria, UA RARE (A) NONE SEEN   Squamous Epithelial / LPF 0-5 0 - 5    Comment: Performed at [facility], [place] [address], [place], [place] [address]  CBC     Status: Abnormal   Collection Time: 04/04/18  6:08 AM  Result Value Ref Range   WBC 16.5 (H) 4.0 - 10.5 K/uL   RBC 4.22 3.87 - 5.11 MIL/uL   Hemoglobin 13.6 12.0 - 15.0 g/dL   HCT 39.5 36.0 - 46.0 %   MCV 93.6 78.0 - 100.0 fL   MCH 32.2 26.0 - 34.0 pg   MCHC 34.4 30.0 - 36.0 g/dL   RDW 12.8 11.5 - 15.5 %   Platelets 145 (L) 150 - 400 K/uL    Comment: Performed at [facility], [place] [address] [place], [place] [address]  Procalcitonin - Baseline     Status: None   Collection Time: 04/04/18 10:00 AM  Result Value Ref Range   Procalcitonin <0.10 ng/mL    Comment:        Interpretation: PCT (Procalcitonin) <= 0.5 ng/mL: Systemic infection (sepsis) is not likely. Local bacterial infection is possible. (NOTE)       Sepsis PCT Algorithm           Lower Respiratory Tract                                      Infection PCT Algorithm    ----------------------------     ----------------------------         PCT < 0.25 ng/mL                PCT < 0.10 ng/mL         Strongly encourage  Strongly discourage   discontinuation of antibiotics    initiation of antibiotics    ----------------------------     -----------------------------       PCT 0.25 - 0.50 ng/mL            PCT 0.10 - 0.25 ng/mL               OR       >80%  decrease in PCT            Discourage initiation of                                            antibiotics      Encourage discontinuation           of antibiotics    ----------------------------     -----------------------------         PCT >= 0.50 ng/mL              PCT 0.26 - 0.50 ng/mL               AND        <80% decrease in PCT             Encourage initiation of                                             antibiotics       Encourage continuation           of antibiotics    ----------------------------     -----------------------------        PCT >= 0.50 ng/mL                  PCT > 0.50 ng/mL               AND         increase in PCT                  Strongly encourage                                      initiation of antibiotics    Strongly encourage escalation           of antibiotics                                     -----------------------------                                           PCT <= 0.25 ng/mL                                                 OR                                        >  80% decrease in PCT                                     Discontinue / Do not initiate                                             antibiotics Performed at [facility] [address] [place], [place] [address]     Ct Thoracic Spine Wo Contrast  Result Date: 04/02/2018 CLINICAL DATA:  84 year old who fell last week at home, presenting today not able to ambulate, a new finding. Personal history of multiple thoracic compression fractures and a prior L2 compression fracture with augmentation. CT requested to determine if there are acute fractures accounting for the new symptoms. EXAM: CT THORACIC SPINE WITHOUT CONTRAST TECHNIQUE: Multidetector CT images of the thoracic were obtained using the standard protocol without intravenous contrast. COMPARISON:  Bone window images from CT abdomen and pelvis 10/13/2017. Thoracic spine x-rays 11/07/2010. No interval thoracic  spine imaging in the [facility]. FINDINGS: Alignment: Anatomic POSTERIOR alignment with the exception of retropulsion of the bone fragment at T9 related to a remote fracture. Vertebrae: Remote T9 compression fracture nearing 100% with retropulsion of the POSTERIOR bone fragment and ANTERIOR displacement of the ANTERIOR bone fragment, unchanged in appearance since the CT bone window images in December, 2018. The compression fracture of the UPPER endplate of W46 on the order of 60% or so is unchanged dating back to the 2012 x-rays. No new/acute thoracic spine compression fractures. Generalized osseous demineralization as noted previously. Paraspinal and other soft tissues: No paraspinous or canal hematoma. Disc levels: Well-preserved disc spaces throughout the thoracic spine. Mild spinal stenosis at the T9 level as result of the retropulsed bone fragment. No evidence of spinal stenosis elsewhere. Note is made of degenerative disc disease and spondylosis at the C5-6 level. IMPRESSION: 1. No acute thoracic spine compression fractures. 2. Remote compression fractures of T9 and T12, unchanged since the prior CT in December, 2018. Electronically Signed   By: Evangeline Dakin M.D.   On: 04/02/2018 18:01   Ct Lumbar Spine Wo Contrast  Result Date: 04/02/2018 CLINICAL DATA:  84 year old who fell last week at home, presenting today not able to ambulate, a new finding. Personal history of multiple thoracic compression fractures and a prior L2 compression fracture with augmentation. CT requested to determine if there are acute fractures accounting for the new symptoms. EXAM: CT LUMBAR SPINE WITHOUT CONTRAST TECHNIQUE: Multidetector CT imaging of the lumbar spine was performed without intravenous contrast administration. Multiplanar CT image reconstructions were also generated. COMPARISON:  Bone window images from CT abdomen and pelvis 10/13/2017 and earlier. MRI lumbar spine 02/24/2011. FINDINGS: Segmentation: 5  non-rib-bearing lumbar vertebrae. Alignment: Anatomic POSTERIOR alignment from L3 through S1. Likely as a result of the L2 fracture and the severe facet degenerative changes there is grade 1-2 spondylolisthesis at L2-3 measuring approximately 11 mm, unchanged from the most recent prior CT in December, 2018. Vertebrae: Osseous demineralization. Compression fracture of the L2 vertebral body nearly 100%, with prior augmentation and retropulsion of the POSTERIOR fragment, unchanged since December, 2018. No new/acute lumbar compression fractures. Paraspinal and other soft tissues: No paraspinous or canal hematoma. Disc levels: Severe multifactorial spinal stenosis at L2-3, due to the retropulsed bone fragment  and severe facet degenerative changes. Moderate multifactorial spinal stenosis at L3-4 and L4-5. IMPRESSION: 1. No acute lumbar spine compression fractures. 2. Remote L2 compression fracture with augmentation, unchanged since December, 2018. 3. Grade 1-2 spondylolisthesis of L2 on L3 measuring approximately 11 mm, also unchanged. 4. Multilevel multifactorial spinal stenosis, greatest at L2-3. Electronically Signed   By: Evangeline Dakin M.D.   On: 04/02/2018 18:11   Ct Hip Left Wo Contrast  Addendum Date: 04/03/2018   ADDENDUM REPORT: 04/03/2018 10:43 ADDENDUM: This addendum is given as the patient has nondisplaced bilateral sacral fractures which were not described in the initially dictated report. The fractures are acute. Electronically Signed   By: Inge Rise M.D.   On: 04/03/2018 10:43   Result Date: 04/03/2018 CLINICAL DATA:  Status post fall last week, with left hip pain. Initial encounter. EXAM: CT OF THE LEFT HIP WITHOUT CONTRAST TECHNIQUE: Multidetector CT imaging of the left hip was performed according to the standard protocol. Multiplanar CT image reconstructions were also generated. COMPARISON:  CT of the abdomen and pelvis performed 10/13/2017, and left hip radiographs performed earlier today  at 8:23 p.m. FINDINGS: Bones/Joint/Cartilage There is no evidence of fracture or dislocation. The proximal left femur appears intact. The left femoral head remains seated at the acetabulum. The visualized portions of the left sacroiliac joint and pubic symphysis are grossly unremarkable. There is mild osteopenia of visualized osseous structures. No hip joint effusion is seen. The cartilage is not well assessed on CT. Ligaments Suboptimally assessed by CT. Muscles and Tendons The visualized musculature is unremarkable in appearance. No focal tendon abnormalities are seen. Soft tissues No significant soft tissue hematoma is identified. The patient's surgical device at the pelvis appears laterally tilted in comparison to prior studies. Presacral stranding is nonspecific in appearance. This may reflect sequelae of prior proctitis. IMPRESSION: 1. No evidence of fracture or dislocation. 2. Surgical device at the pelvis appears laterally tilted in comparison to prior studies. Would correlate clinically for expected positioning. 3. Presacral stranding is nonspecific in appearance. This may reflect sequelae of prior proctitis. 4. Mild osteopenia of visualized osseous structures. Electronically Signed: By: Garald Balding M.D. On: 04/03/2018 00:36   Dg Hip Unilat W Or Wo Pelvis 2-3 Views Left  Result Date: 04/02/2018 CLINICAL DATA:  Fall last week.  Worsening left hip pain. EXAM: DG HIP (WITH OR WITHOUT PELVIS) 2-3V LEFT COMPARISON:  10/13/2017 CT abdomen/pelvis. FINDINGS: No pelvic fracture or diastasis. No left hip fracture or dislocation. No suspicious focal osseous lesions. Mild degenerative changes in the weight-bearing portions of both hip joints. Degenerative changes in the visualized lower lumbar spine. Surgical suture line overlies the upper sacrum. Stable metallic device overlying the pubic symphysis. IMPRESSION: No fracture.  No left hip malalignment. Electronically Signed   By: Ilona Sorrel M.D.   On: 04/02/2018  20:45    Review of Systems  Constitutional: Negative for weight loss.  HENT: Negative for ear discharge, ear pain, hearing loss and tinnitus.   Eyes: Negative for blurred vision, double vision, photophobia and pain.  Respiratory: Negative for cough, sputum production and shortness of breath.   Cardiovascular: Negative for chest pain.  Gastrointestinal: Negative for abdominal pain, nausea and vomiting.  Genitourinary: Negative for dysuria, flank pain, frequency and urgency.  Musculoskeletal: Negative for back pain, falls, joint pain, myalgias and neck pain.  Neurological: Negative for dizziness, tingling, sensory change, focal weakness, loss of consciousness and headaches.  Endo/Heme/Allergies: Does not bruise/bleed easily.  Psychiatric/Behavioral: Negative for depression, memory  loss and substance abuse. The patient is not nervous/anxious.    Blood pressure (!) 144/80, pulse 88, temperature 98.4 F (36.9 C), temperature source Oral, resp. rate 19, height _0  (1.6 m), weight 53.5 kg (118 lb), last menstrual period 11/02/1980, SpO2 95 %. Physical Exam  Constitutional: She appears well-developed and well-nourished. No distress.  HENT:  Head: Normocephalic and atraumatic.  Neck: Normal range of motion.  Cardiovascular: Normal rate and regular rhythm.  Respiratory: Effort normal. No respiratory distress.  Musculoskeletal:  BLE No traumatic wounds, ecchymosis, or rash  Nontender  No knee or ankle effusion  Knee stable to varus/ valgus and anterior/posterior stress  Sens DPN, SPN, TN intact  Motor EHL, ext, flex, evers 5/5  DP 1+, PT 0, No significant edema   Neurological: She is alert.  Skin: Skin is warm and dry. She is not diaphoretic.  Psychiatric: She has a normal mood and affect. Her behavior is normal.    Assessment/Plan: Sacral fxs -- Recommend WBAT with RW for stabilization. If she cannot ambulate with PT then would suggest dedicated pelvic CT though I don't think this  will be a problem. She should f/u with Dr. Mardelle Matte 1 week following discharge.    Lisette Abu, PA-C Orthopedic Surgery [phone] 04/04/2018, 10:11 AM   Reviewed, discussed, and agree with above.  Johnny Bridge, MD

## 2018-04-04 NOTE — Telephone Encounter (Signed)
If she wants to go to a rehab facility it has to be straight from the hospital - so the doctors there and social workers are in charge of that (since I do not do hospital work) Are they saying she does not qualify or medicare will not pay?

## 2018-04-04 NOTE — Telephone Encounter (Signed)
Left VM letting Michelle Gregory (daughter) know Dr. Marliss Coots comments.  Per last Social worker note in Epic: "Spoke with family at bedside. Discussed obs status and how that impacts SNF placement. Prior to admission family had been providing care but not 24h. They were hoping for SNF placement and that is the recommendation from PT. Discussed with them that it may be private pay, they state they would rather take her home. Currently being worked up for confusion, will f/u once w/u complete. [phone]"  So it sounds like insurance will not pay, FYI to PCP

## 2018-04-04 NOTE — Progress Notes (Addendum)
PROGRESS NOTE  Michelle Gregory MHD:[id number] DOB: 05-Aug-1923 DOA: 04/02/2018 PCP: Abner Greenspan, MD  HPI/Recap of past 24 hours: Michelle Gregory is a 84 y.o. female with medical history significant of hypertension, anxiety, hyperlipidemia, reactive airway disease, GERD, osteoarthritis, colon cancer, C. difficile infection.  Patient presented to the emergency department secondary to worsening back pain.  Patient is confused and is unable to give a full history.  History is obtained from patient, family, chart review.  Patient suffered a fall few weeks ago and was seen at her primary care physician's office and cleared.  However, her pain worsened and she presented to the emergency department for evaluation.  Per her daughter, patient used to walk on her own.  She has a walker, but rarely uses it.  04/04/18: somnolent but arousable to voices. Confused. States her head hurts. CT head no contrast personally reviewed revealed no acute intracranial findings.   Assessment/Plan: Principal Problem:   Sacral fracture (HCC) Active Problems:   Hyperlipidemia   Essential hypertension   Anxiety disorder  Acute metabolic encephalopathy Possibly 2/2 to UTI, poa Unclear etiology Obtain UA and urine cx CT head no contrast personally reviewed unremarkable Fall precautions Reorient as needed Start seroquel 25 mg bid for agitation  UTI, poa Start IV ceftriaxone Obtain urine cx  Sacral fractures post fall Appreciate ortho recommendations WBAT PT  Fall precautions Pain management  HTN Now hypotensive Hold off her BP meds  Dehydration Start gentle IV fluid NS 75cc/hr Encourage oral fluid intake  Leukocytosis Possibly from UTI vs reactive procalcitonin <0.10  Hx of cdiff Positive stool 10/13/17   Code Status: full  Family Communication: daughter on the phone   Disposition Plan: Home vs SNF when clinically stable.   Consultants:  None  Procedures:  NOne  Antimicrobials:  IV  ceftriaxone  DVT prophylaxis:  sq lovenox   Objective: Vitals:   04/03/18 1236 04/03/18 1641 04/03/18 2055 04/04/18 1402  BP: (!) 152/86 (!) 148/90 (!) 144/80 (!) 92/48  Pulse: (!) 52 81 88 65  Resp: 18 16 19 16   Temp:  98.4 F (36.9 C) 98.4 F (36.9 C) 98.7 F (37.1 C)  TempSrc:  Oral Oral Oral  SpO2: 94% 94% 95% 94%  Weight:      Height:        Intake/Output Summary (Last 24 hours) at 04/04/2018 1628 Last data filed at 04/04/2018 1525 Gross per 24 hour  Intake 784 ml  Output 550 ml  Net 234 ml   Filed Weights   04/02/18 1645  Weight: 53.5 kg (118 lb)    Exam:  . General: 84 y.o. year-old female well developed well nourished in no acute distress.  Somnolent arousable to voices. Confused.. . Cardiovascular: Regular rate and rhythm with no rubs or gallops.  No thyromegaly or JVD noted.   Marland Kitchen Respiratory: Clear to auscultation with no wheezes or rales. Good inspiratory effort. . Abdomen: Soft nontender nondistended with normal bowel sounds x4 quadrants. . Musculoskeletal: No lower extremity edema. 2/4 pulses in all 4 extremities. . Skin: bruising on RLE anteriorly. Marland Kitchen Psychiatry: Mood is appropriate for condition and setting   Data Reviewed: CBC: Recent Labs  Lab 03/29/18 1443 03/31/18 1716 04/02/18 1754 04/04/18 0608  WBC 8.0 9.6 8.0 16.5*  NEUTROABS 5.5 7.5 6.3  --   HGB 13.0 13.2 13.7 13.6  HCT 38.4 37.7 40.6 39.5  MCV 98.0 95.2 95.5 93.6  PLT 101.0* 47* 101* 211*   Basic Metabolic Panel: Recent Labs  Lab 03/29/18 1443 03/31/18 1716 04/02/18 1754  NA 137 134* 139  K 4.3 4.2 3.9  CL 98 98* 99*  CO2 31 24 28   GLUCOSE 109* 113* 115*  BUN 31* 26* 18  CREATININE 0.87 0.74 0.73  CALCIUM 9.7 9.0 9.1   GFR: Estimated Creatinine Clearance: 36.3 mL/min (by C-G formula based on SCr of 0.73 mg/dL). Liver Function Tests: Recent Labs  Lab 03/29/18 1443  AST 17  ALT 11  ALKPHOS 63  BILITOT 0.9  PROT 6.5  ALBUMIN 3.8   No results for input(s): LIPASE,  AMYLASE in the last 168 hours. No results for input(s): AMMONIA in the last 168 hours. Coagulation Profile: No results for input(s): INR, PROTIME in the last 168 hours. Cardiac Enzymes: No results for input(s): CKTOTAL, CKMB, CKMBINDEX, TROPONINI in the last 168 hours. BNP (last 3 results) No results for input(s): PROBNP in the last 8760 hours. HbA1C: No results for input(s): HGBA1C in the last 72 hours. CBG: No results for input(s): GLUCAP in the last 168 hours. Lipid Profile: No results for input(s): CHOL, HDL, LDLCALC, TRIG, CHOLHDL, LDLDIRECT in the last 72 hours. Thyroid Function Tests: No results for input(s): TSH, T4TOTAL, FREET4, T3FREE, THYROIDAB in the last 72 hours. Anemia Panel: No results for input(s): VITAMINB12, FOLATE, FERRITIN, TIBC, IRON, RETICCTPCT in the last 72 hours. Urine analysis:    Component Value Date/Time   COLORURINE YELLOW 04/04/2018 [place] 04/04/2018 1017   LABSPEC 1.012 04/04/2018 1017   PHURINE 5.0 04/04/2018 1017   GLUCOSEU NEGATIVE 04/04/2018 1017   HGBUR MODERATE (A) 04/04/2018 1017   HGBUR trace-lysed 12/12/2010 1509   BILIRUBINUR NEGATIVE 04/04/2018 1017   BILIRUBINUR 1+ 03/25/2018 1520   KETONESUR NEGATIVE 04/04/2018 1017   PROTEINUR NEGATIVE 04/04/2018 1017   UROBILINOGEN 1.0 03/25/2018 1520   UROBILINOGEN 0.2 12/12/2010 1509   NITRITE NEGATIVE 04/04/2018 1017   LEUKOCYTESUR MODERATE (A) 04/04/2018 1017   Sepsis Labs: @LABRCNTIP (procalcitonin:4,lacticidven:4)  ) Recent Results (from the past 240 hour(s))  Urine culture     Status: None   Collection Time: 03/31/18  5:53 PM  Result Value Ref Range Status   Specimen Description   Final    URINE, CATHETERIZED Performed at [facility], [address] [place], [place] [address]    Special Requests   Final    NONE Performed at [facility], [address] [place], [place] [address]    Culture   Final    NO GROWTH Performed at [facility], [place] [address], [place], [place] [address]    Report Status 04/01/2018 FINAL  Final      Studies: Ct Head Wo Contrast  Result Date: 04/04/2018 CLINICAL DATA:  84 year old female with confusion, recent fall. Increasing back pain. EXAM: CT HEAD WITHOUT CONTRAST TECHNIQUE: Contiguous axial images were obtained from the base of the skull through the vertex without intravenous contrast. COMPARISON:  Head CT without contrast 12/28/2008. FINDINGS: Brain: Mild generalized cerebral volume loss since 2010. Mild for age patchy and scattered bilateral cerebral white matter hypodensity. No midline shift, ventriculomegaly, mass effect, evidence of mass lesion, intracranial hemorrhage or evidence of cortically based acute infarction. No cortical encephalomalacia identified. Vascular: Calcified atherosclerosis at the skull base. No suspicious intracranial vascular hyperdensity. Skull: No acute osseous abnormality identified. Osteopenia and hyperostosis frontalis. Sinuses/Orbits: Visualized paranasal sinuses and mastoids are stable and well pneumatized. Other: Stable orbits soft tissues. Scalp soft tissues are stable and within normal limits.  IMPRESSION: 1. No acute intracranial abnormality. No acute traumatic injury identified. 2. Mild for age white matter changes. Electronically Signed   By: Genevie Ann M.D.   On: 04/04/2018 10:27    Scheduled Meds: . aspirin EC  81 mg Oral Daily  . docusate sodium  100 mg Oral BID  . gabapentin  300 mg Oral BID  . losartan  100 mg Oral Daily  . metoprolol tartrate  50 mg Oral BID  . PARoxetine  30 mg Oral q morning - 10a  . senna  1 tablet Oral BID    Continuous Infusions: . sodium chloride Stopped (04/04/18 0600)     LOS: 0 days     Kayleen Memos, MD [facility] Pager [phone]  If 7PM-7AM, please contact night-coverage [email or web address] Password [facility] 04/04/2018, 4:28 PM

## 2018-04-04 NOTE — Progress Notes (Signed)
Spoke with family at bedside. Discussed obs status and how that impacts SNF placement. Prior to admission family had been providing care but not 24h. They were hoping for SNF placement and that is the recommendation from PT. Discussed with them that it may be private pay, they state they would rather take her home. Currently being worked up for confusion, will f/u once w/u complete. [phone]

## 2018-04-04 NOTE — Progress Notes (Signed)
PT Cancellation Note  Patient Details Name: Michelle Gregory MRN: [id number] DOB: 26-Dec-1922   Cancelled Treatment:     Head CT this morning, resting this afternoon.  Per chart review, family plans to take pt home due to observation status  and financial.  Will attempt to see tomorrow.    Rica Koyanagi  PTA [facility]  Acute  Rehab Pager      [phone]

## 2018-04-04 NOTE — Telephone Encounter (Signed)
Copied from [place] [id number]. Topic: Quick Communication - See Telephone Encounter >> Apr 04, 2018  8:33 AM Synthia Innocent wrote: CRM for notification. See Telephone encounter for: 04/04/18.Daughter would like to speak with CMA. Patient is in hospital, had a fall and broke her tail bone. Has been unable to get up, hospital is trying to send her home. She would like to see is she can send her to a rehab center. Having no luck with the hospital or social workers. Would like any help. Please advise

## 2018-04-05 LAB — URINE CULTURE

## 2018-04-05 LAB — BASIC METABOLIC PANEL
Anion gap: 9 (ref 5–15)
BUN: 28 mg/dL — ABNORMAL HIGH (ref 6–20)
CO2: 29 mmol/L (ref 22–32)
Calcium: 8.6 mg/dL — ABNORMAL LOW (ref 8.9–10.3)
Chloride: 103 mmol/L (ref 101–111)
Creatinine, Ser: 0.77 mg/dL (ref 0.44–1.00)
GFR calc Af Amer: >60 mL/min (ref >60)
GFR calc non Af Amer: >60 mL/min (ref >60)
Glucose, Bld: 98 mg/dL (ref 65–99)
Potassium: 3.7 mmol/L (ref 3.5–5.1)
Sodium: 141 mmol/L (ref 135–145)

## 2018-04-05 LAB — CBC
HCT: 39.4 % (ref 36.0–46.0)
Hemoglobin: 13.3 g/dL (ref 12.0–15.0)
MCH: 32.8 pg (ref 26.0–34.0)
MCHC: 33.8 g/dL (ref 30.0–36.0)
MCV: 97.3 fL (ref 78.0–100.0)
Platelets: 112 10*3/uL — ABNORMAL LOW (ref 150–400)
RBC: 4.05 MIL/uL (ref 3.87–5.11)
RDW: 13 % (ref 11.5–15.5)
WBC: 7.6 10*3/uL (ref 4.0–10.5)

## 2018-04-05 MED ORDER — QUETIAPINE FUMARATE 25 MG PO TABS
25.0000 mg | ORAL_TABLET | Freq: Two times a day (BID) | ORAL | Status: DC
  Administered 2018-04-05 – 2018-04-07 (×5): 25 mg via ORAL
  Filled 2018-04-05 (×5): qty 1

## 2018-04-05 NOTE — Progress Notes (Signed)
PROGRESS NOTE  Michelle Gregory NLG:[id number] DOB: 08-15-1923 DOA: 04/02/2018 PCP: Abner Greenspan, MD  HPI/Recap of past 24 hours: Michelle Gregory is a 84 y.o. female with medical history significant of hypertension, anxiety, hyperlipidemia, reactive airway disease, GERD, osteoarthritis, colon cancer, C. difficile infection.  Patient presented to the emergency department secondary to worsening back pain.  Patient is confused and is unable to give a full history.  History is obtained from patient, family, chart review.  Patient suffered a fall few weeks ago and was seen at her primary care physician's office and cleared.  However, her pain worsened and she presented to the emergency department for evaluation.  Per her daughter, patient used to walk on her own.  She has a walker, but rarely uses it.  04/04/18: somnolent but arousable to voices. Confused. States her head hurts. CT head no contrast personally reviewed revealed no acute intracranial findings.   04/05/18: More alert and oriented x 2. No new complaints. On IV antibiotic for UTI.  Assessment/Plan: Principal Problem:   Sacral fracture (HCC) Active Problems:   Hyperlipidemia   Essential hypertension   Anxiety disorder  AMS, resolving Acute metabolic encephalopathy suspect 2/2 to UTI Urine cx CT head no contrast personally reviewed unremarkable Fall precautions Reorient as needed Start seroquel 25 mg bid for agitation  UTI, poa Continue IV ceftriaxone Urine cx suggests recollection  Sacral fractures post fall Appreciate ortho recommendations WBAT PT  Fall precautions Pain management  HTN, stable Episodes of hypotension have resolved.   Acute Dehydration C/w gentle IV fluid NS 75cc/hr Encourage oral fluid intake  Leukocytosis, resolved Possibly from UTI vs reactive procalcitonin <0.10  Hx of cdiff Positive stool 10/13/17   Code Status: full  Family Communication: daughter on the phone on 04/04/18  Disposition  Plan: Home vs SNF when clinically stable.   Consultants:  None  Procedures:  NOne  Antimicrobials:  IV ceftriaxone  DVT prophylaxis:  sq lovenox   Objective: Vitals:   04/04/18 1759 04/04/18 2139 04/05/18 0545 04/05/18 1301  BP: 108/72 124/62 (!) 155/90 (!) 141/91  Pulse: 86 80 89 89  Resp: 17 18 18 14   Temp:  98.1 F (36.7 C) 98.3 F (36.8 C) 98.2 F (36.8 C)  TempSrc:  Axillary Oral Oral  SpO2: 94% 96% 97% 97%  Weight:      Height:        Intake/Output Summary (Last 24 hours) at 04/05/2018 1514 Last data filed at 04/05/2018 1301 Gross per 24 hour  Intake 1880 ml  Output 1100 ml  Net 780 ml   Filed Weights   04/02/18 1645  Weight: 53.5 kg (118 lb)    Exam:  . General: 84 y.o. year-old female WD wN nAD A&O x 2 . Cardiovascular: RRR no rubs or gallops. No JVD or thyromegaly.   Marland Kitchen Respiratory: Clear to auscultation with no wheezes or rales. Good inspiratory effort. . Abdomen: Soft nontender nondistended with normal bowel sounds x4 quadrants. . Musculoskeletal: No lower extremity edema. 2/4 pulses in all 4 extremities. . Skin: bruising on RLE anteriorly. Marland Kitchen Psychiatry: Mood is Appropriate for condition and setting.   Data Reviewed: CBC: Recent Labs  Lab 03/31/18 1716 04/02/18 1754 04/04/18 0608 04/05/18 0539  WBC 9.6 8.0 16.5* 7.6  NEUTROABS 7.5 6.3  --   --   HGB 13.2 13.7 13.6 13.3  HCT 37.7 40.6 39.5 39.4  MCV 95.2 95.5 93.6 97.3  PLT 47* 101* 145* 081*   Basic Metabolic Panel: Recent  Labs  Lab 03/31/18 1716 04/02/18 1754 04/05/18 0539  NA 134* 139 141  K 4.2 3.9 3.7  CL 98* 99* 103  CO2 24 28 29   GLUCOSE 113* 115* 98  BUN 26* 18 28*  CREATININE 0.74 0.73 0.77  CALCIUM 9.0 9.1 8.6*   GFR: Estimated Creatinine Clearance: 36.3 mL/min (by C-G formula based on SCr of 0.77 mg/dL). Liver Function Tests: No results for input(s): AST, ALT, ALKPHOS, BILITOT, PROT, ALBUMIN in the last 168 hours. No results for input(s): LIPASE, AMYLASE in  the last 168 hours. No results for input(s): AMMONIA in the last 168 hours. Coagulation Profile: No results for input(s): INR, PROTIME in the last 168 hours. Cardiac Enzymes: No results for input(s): CKTOTAL, CKMB, CKMBINDEX, TROPONINI in the last 168 hours. BNP (last 3 results) No results for input(s): PROBNP in the last 8760 hours. HbA1C: No results for input(s): HGBA1C in the last 72 hours. CBG: No results for input(s): GLUCAP in the last 168 hours. Lipid Profile: No results for input(s): CHOL, HDL, LDLCALC, TRIG, CHOLHDL, LDLDIRECT in the last 72 hours. Thyroid Function Tests: No results for input(s): TSH, T4TOTAL, FREET4, T3FREE, THYROIDAB in the last 72 hours. Anemia Panel: No results for input(s): VITAMINB12, FOLATE, FERRITIN, TIBC, IRON, RETICCTPCT in the last 72 hours. Urine analysis:    Component Value Date/Time   COLORURINE YELLOW 04/04/2018 [place] 04/04/2018 1017   LABSPEC 1.012 04/04/2018 1017   PHURINE 5.0 04/04/2018 1017   GLUCOSEU NEGATIVE 04/04/2018 1017   HGBUR MODERATE (A) 04/04/2018 1017   HGBUR trace-lysed 12/12/2010 1509   BILIRUBINUR NEGATIVE 04/04/2018 1017   BILIRUBINUR 1+ 03/25/2018 1520   KETONESUR NEGATIVE 04/04/2018 1017   PROTEINUR NEGATIVE 04/04/2018 1017   UROBILINOGEN 1.0 03/25/2018 1520   UROBILINOGEN 0.2 12/12/2010 1509   NITRITE NEGATIVE 04/04/2018 1017   LEUKOCYTESUR MODERATE (A) 04/04/2018 1017   Sepsis Labs: @LABRCNTIP (procalcitonin:4,lacticidven:4)  ) Recent Results (from the past 240 hour(s))  Urine culture     Status: None   Collection Time: 03/31/18  5:53 PM  Result Value Ref Range Status   Specimen Description   Final    URINE, CATHETERIZED Performed at [facility], [address] [place], [place] [address]    Special Requests   Final    NONE Performed at [facility], [address] [place], [place] [address]    Culture   Final    NO GROWTH Performed at [place], [place] [address], [place], [place] [address]    Report Status 04/01/2018 FINAL  Final  Culture, Urine     Status: Abnormal   Collection Time: 04/04/18 10:16 AM  Result Value Ref Range Status   Specimen Description   Final    URINE, RANDOM Performed at [facility] [place] [address], [place], [place] [address]    Special Requests   Final    NONE Performed at [facility], [place] [address], [place], [place] [address]    Culture MULTIPLE SPECIES PRESENT, SUGGEST RECOLLECTION (A)  Final   Report Status 04/05/2018 FINAL  Final      Studies: No results found.  Scheduled Meds: . aspirin EC  81 mg Oral Daily  . docusate sodium  100 mg Oral BID  . enoxaparin (LOVENOX) injection  40 mg Subcutaneous Q24H  . gabapentin  300 mg Oral BID  . losartan  100 mg Oral Daily  . metoprolol tartrate  50 mg Oral  BID  . PARoxetine  30 mg Oral q morning - 10a  . senna  1 tablet Oral BID    Continuous Infusions: . sodium chloride 20 mL/hr at 04/05/18 0813  . cefTRIAXone (ROCEPHIN)  IV Stopped (04/04/18 1900)     LOS: 1 day     Kayleen Memos, MD [facility] Pager [phone]  If 7PM-7AM, please contact night-coverage [email or web address] Password [facility] 04/05/2018, 3:14 PM

## 2018-04-05 NOTE — Consult Note (Signed)
   [facility] CM Inpatient Consult   04/05/2018  Michelle Gregory Dec 07, 1922 [id number]   Patient screened for potential [facility] Care Management program.   Also noted that patient qualifies for [facility] program if deemed appropriate.   Made inpatient RNCM and inpatient LCSW aware of above notes.   Will continue to follow for disposition plans.   Marthenia Rolling, MSN-Ed, RN,BSN [facility] Liaison [phone]

## 2018-04-05 NOTE — Progress Notes (Signed)
Physical Therapy Treatment Patient Details Name: Michelle Gregory MRN: [id number] DOB: 03/08/1923 Today's Date: 04/05/2018    History of Present Illness Pt admitted with confusion and back/L hip pain with hx of fall ~ 1 wk ago.   Bilat nondisplaced sacral fx found on second review of CT scan    PT Comments    Pt was unable to amb.  General transfer comment: multiple attempts to get pt to self perform however pt was unable to clear hips off bed and unable to stand upright.  Very little WBing tolerance thru L LE due to hip pain.  General Gait Details: unable to weight shift and unable to tolerate WBing thru L LE due to l hip pain.  Transfer only which was difficult.  Incomplete partial turn 1/4 from bed to [facility] then to recliner.     Follow Up Recommendations  SNF     Equipment Recommendations  None recommended by PT    Recommendations for Other Services       Precautions / Restrictions Precautions Precautions: Fall Restrictions Weight Bearing Restrictions: No Other Position/Activity Restrictions: WBAT    Mobility  Bed Mobility Overal bed mobility: Needs Assistance Bed Mobility: Supine to Sit     Supine to sit: Mod assist;+2 for physical assistance;+2 for safety/equipment     General bed mobility comments: increased, increased time to complete and repeat functional VC's to stay on task.  Great diificulty scooting due to R hip pain.    Transfers Overall transfer level: Needs assistance Equipment used: Rolling walker (2 wheeled);None Transfers: Sit to/from [facility] Sit to Stand: Mod assist;Max assist;+2 physical assistance;+2 safety/equipment Stand pivot transfers: Mod assist;+2 physical assistance;+2 safety/equipment;Max assist       General transfer comment: multiple attempts to get pt to self perform however pt was unable to clear hips off bed and unable to stand upright.  Very little WBing tolerance thru L LE due to hip pain.    Ambulation/Gait              General Gait Details: unable to weight shift and unable to tolerate WBing thru L LE due to l hip pain.  Transfer only which was difficult.  Incomplete partial turn 1/4 from bed to [facility] then to recliner.   Stairs             Wheelchair Mobility    Modified Rankin (Stroke Patients Only)       Balance                                            Cognition Arousal/Alertness: Awake/alert Behavior During Therapy: WFL for tasks assessed/performed Overall Cognitive Status: Within Functional Limits for tasks assessed                                 General Comments: appears at prior level of cognition      Exercises      General Comments        Pertinent Vitals/Pain Pain Assessment: Faces Faces Pain Scale: Hurts even more Pain Location: RLhip unable to tolerate WBing Pain Descriptors / Indicators: Grimacing;Guarding Pain Intervention(s): Monitored during session;Repositioned    Home Living                      Prior Function  PT Goals (current goals can now be found in the care plan section) Progress towards PT goals: Progressing toward goals    Frequency    Min 3X/week      PT Plan Current plan remains appropriate    Co-evaluation              AM-PAC PT "6 Clicks" Daily Activity  Outcome Measure  Difficulty turning over in bed (including adjusting bedclothes, sheets and blankets)?: Unable Difficulty moving from lying on back to sitting on the side of the bed? : Unable Difficulty sitting down on and standing up from a chair with arms (e.g., wheelchair, bedside commode, etc,.)?: Unable Help needed moving to and from a bed to chair (including a wheelchair)?: Total Help needed walking in hospital room?: Total Help needed climbing 3-5 steps with a railing? : Total 6 Click Score: 6    End of Session Equipment Utilized During Treatment: Gait belt Activity Tolerance: Patient limited by  pain Patient left: in chair;with chair alarm set;Other (comment)(multiple pillows for comfort) Nurse Communication: Mobility status PT Visit Diagnosis: Difficulty in walking, not elsewhere classified (R26.2);History of falling (Z91.81);Unsteadiness on feet (R26.81);Pain Pain - Right/Left: Left Pain - part of body: Leg     Time: 1026-1050 PT Time Calculation (min) (ACUTE ONLY): 24 min  Charges:  $Therapeutic Activity: 23-37 mins                    G Codes:       Rica Koyanagi  PTA [facility]  Acute  Rehab Pager      [phone]

## 2018-04-05 NOTE — Clinical Social Work Note (Signed)
Clinical Social Work Assessment  Patient Details  Name: Michelle Gregory MRN: [id number] Date of Birth: [birth year]  Date of referral:  04/05/18               Reason for consult:  Facility Placement, Discharge Planning                Permission sought to share information with:  Family Supports, Facility Sport and exercise psychologist, Case Manager Permission granted to share information::     Name::        Agency::  SNF  Relationship::  Daughter   Contact Information:  Cell: [phone] Home [phone]  Housing/Transportation Living arrangements for the past 2 months:  Single Family Home Source of Information:  Adult Children Patient Interpreter Needed:  None Criminal Activity/Legal Involvement Pertinent to Current Situation/Hospitalization:  No - Comment as needed Significant Relationships:  Adult Children Lives with:  Self Do you feel safe going back to the place where you live?  Yes Need for family participation in patient care:  No (Coment)  Care giving concerns:   Fall. PT recommends SNF placement.   Social Worker assessment / plan:  CSW met with patient at bedside, explain role and reason for consult- to assist with discharge planning to SNF, if patient qualifies with three midnight stay. Patient daughter reports the patient lives alone in a single family home. Daughter reports prior to the patient fall she did not walk with any assistance. Daughter reports she help prepare the patient meals, and assist with her medications.  Daughter reports the patient was in a facility in December 2018 for rehab after her hospital stay. Patient had C-Diff at the time.  Patient daughter reports a facility close to their home is preferable [facility] or [facility]  CSW reminded patient daughter SNF process and later following up with bed offers. Daughter reports understanding.   Patient daughter reports if the patient does not make it to three night stay, the daughter has arranged for the patient to  stay with her and spouse to complete rehab at home. She reports their small neighborhood community has come together to help assist with the patient care and needs as well.   Employment status:    Insurance information:  Medicare PT Recommendations:  [place] / Referral to community resources:  [place]  Patient/Family's Response to care:  Agreeable and Responding well to care.   Patient/Family's Understanding of and Emotional Response to Diagnosis, Current Treatment, and Prognosis:  Patient has dementia. Patient daughter has good understanding of patient diagnosis. She hopeful rehab will help get patient back to her baseline and where she is most comfortable, at home. Daughter reports, "I just want her to get strong enough to go home, and we can continue caring for her there. She likes her own home."  Emotional Assessment Appearance:  Appears stated age Attitude/Demeanor/Rapport:    Affect (typically observed):  Unable to Assess Orientation:  Oriented to Place, Oriented to  Time, Oriented to Situation, Oriented to Self Alcohol / Substance use:  Not Applicable Psych involvement (Current and /or in the community):  No (Comment)  Discharge Needs  Concerns to be addressed:  Discharge Planning Concerns Readmission within the last 30 days:  No Current discharge risk:  Dependent with Mobility Barriers to Discharge:  Continued Medical Work up   [employer], Ivalee 04/05/2018, 12:19 PM

## 2018-04-05 NOTE — Progress Notes (Signed)
Physical Therapy Treatment Patient Details Name: Michelle Gregory MRN: [id number] DOB: 01-Mar-1923 Today's Date: 04/05/2018    History of Present Illness Pt admitted with confusion and back/L hip pain with hx of fall ~ 1 wk ago.   Bilat nondisplaced sacral fx found on second review of CT scan    PT Comments    Assisted pt from recliner to bedside commode, then to bed. Pt reports 10/10 pain, which significantly limits activity tolerance. +2 assist needed for transfers, unable to ambulate 2* pain.    Follow Up Recommendations  SNF     Equipment Recommendations  None recommended by PT    Recommendations for Other Services       Precautions / Restrictions Precautions Precautions: Fall Restrictions Weight Bearing Restrictions: No Other Position/Activity Restrictions: WBAT    Mobility  Bed Mobility Overal bed mobility: Needs Assistance Bed Mobility: Sit to Supine     Supine to sit: Mod assist;+2 for physical assistance;+2 for safety/equipment Sit to supine: Mod assist   General bed mobility comments: assist for LEs into bed  Transfers Overall transfer level: Needs assistance Equipment used: Rolling walker (2 wheeled) Transfers: Sit to/from [facility] Sit to Stand: Mod assist;Max assist;+2 physical assistance;+2 safety/equipment Stand pivot transfers: Mod assist;+2 physical assistance;+2 safety/equipment;Max assist       General transfer comment: recliner to 3 in 1 then to bed, SPT x 2 with RW, tolerance limited by 10/10 pain  Ambulation/Gait             General Gait Details: unable to weight shift and unable to tolerate WBing thru L LE due to l hip pain.  Transfer only which was difficult.  Incomplete partial turn 1/4 from bed to [facility] then to recliner.   Stairs             Wheelchair Mobility    Modified Rankin (Stroke Patients Only)       Balance Overall balance assessment: Needs assistance Sitting-balance support: Bilateral upper  extremity supported;Feet supported Sitting balance-Leahy Scale: Fair     Standing balance support: Bilateral upper extremity supported Standing balance-Leahy Scale: Poor                              Cognition Arousal/Alertness: Awake/alert Behavior During Therapy: WFL for tasks assessed/performed Overall Cognitive Status: Within Functional Limits for tasks assessed                                 General Comments: appears at prior level of cognition      Exercises      General Comments        Pertinent Vitals/Pain Pain Assessment: Faces Pain Score: 10-Worst pain ever Faces Pain Scale: Hurts even more Pain Location: RLhip unable to tolerate WBing Pain Descriptors / Indicators: Grimacing;Guarding Pain Intervention(s): Limited activity within patient's tolerance;Monitored during session;Premedicated before session;Repositioned    Home Living                      Prior Function            PT Goals (current goals can now be found in the care plan section) Acute Rehab PT Goals Patient Stated Goal: hurt less PT Goal Formulation: With patient Time For Goal Achievement: 04/17/18 Potential to Achieve Goals: Fair Progress towards PT goals: Progressing toward goals    Frequency  Min 3X/week      PT Plan Current plan remains appropriate    Co-evaluation              AM-PAC PT "6 Clicks" Daily Activity  Outcome Measure  Difficulty turning over in bed (including adjusting bedclothes, sheets and blankets)?: Unable Difficulty moving from lying on back to sitting on the side of the bed? : Unable Difficulty sitting down on and standing up from a chair with arms (e.g., wheelchair, bedside commode, etc,.)?: Unable Help needed moving to and from a bed to chair (including a wheelchair)?: Total Help needed walking in hospital room?: Total Help needed climbing 3-5 steps with a railing? : Total 6 Click Score: 6    End of Session  Equipment Utilized During Treatment: Gait belt Activity Tolerance: Patient limited by pain Patient left: in chair;with chair alarm set;Other (comment)(multiple pillows for comfort) Nurse Communication: Mobility status PT Visit Diagnosis: Difficulty in walking, not elsewhere classified (R26.2);History of falling (Z91.81);Unsteadiness on feet (R26.81);Pain Pain - Right/Left: Left Pain - part of body: Leg     Time: 2446-2863 PT Time Calculation (min) (ACUTE ONLY): 11 min  Charges:  $Therapeutic Activity: 8-22 mins                    G Codes:          Philomena Doheny 04/05/2018, 3:35 PM [phone]

## 2018-04-06 ENCOUNTER — Other Ambulatory Visit: Payer: MEDICARE

## 2018-04-06 DIAGNOSIS — S32110A Nondisplaced Zone I fracture of sacrum, initial encounter for closed fracture: Secondary | ICD-10-CM

## 2018-04-06 LAB — COMPREHENSIVE METABOLIC PANEL
ALT: 13 U/L — ABNORMAL LOW (ref 14–54)
AST: 18 U/L (ref 15–41)
Albumin: 3 g/dL — ABNORMAL LOW (ref 3.5–5.0)
Alkaline Phosphatase: 79 U/L (ref 38–126)
Anion gap: 8 (ref 5–15)
BUN: 18 mg/dL (ref 6–20)
CO2: 29 mmol/L (ref 22–32)
Calcium: 8.6 mg/dL — ABNORMAL LOW (ref 8.9–10.3)
Chloride: 105 mmol/L (ref 101–111)
Creatinine, Ser: 0.62 mg/dL (ref 0.44–1.00)
GFR calc Af Amer: >60 mL/min (ref >60)
GFR calc non Af Amer: >60 mL/min (ref >60)
Glucose, Bld: 95 mg/dL (ref 65–99)
Potassium: 3.8 mmol/L (ref 3.5–5.1)
Sodium: 142 mmol/L (ref 135–145)
Total Bilirubin: 0.6 mg/dL (ref 0.3–1.2)
Total Protein: 5.6 g/dL — ABNORMAL LOW (ref 6.5–8.1)

## 2018-04-06 LAB — CBC
HCT: 38 % (ref 36.0–46.0)
Hemoglobin: 12.7 g/dL (ref 12.0–15.0)
MCH: 32.2 pg (ref 26.0–34.0)
MCHC: 33.4 g/dL (ref 30.0–36.0)
MCV: 96.2 fL (ref 78.0–100.0)
Platelets: 131 10*3/uL — ABNORMAL LOW (ref 150–400)
RBC: 3.95 MIL/uL (ref 3.87–5.11)
RDW: 12.9 % (ref 11.5–15.5)
WBC: 6.6 10*3/uL (ref 4.0–10.5)

## 2018-04-06 LAB — MAGNESIUM: Magnesium: 1.5 mg/dL — ABNORMAL LOW (ref 1.7–2.4)

## 2018-04-06 MED ORDER — HYDRALAZINE HCL 20 MG/ML IJ SOLN
5.0000 mg | Freq: Four times a day (QID) | INTRAMUSCULAR | Status: DC | PRN
  Administered 2018-04-07: 5 mg via INTRAVENOUS
  Filled 2018-04-06: qty 1

## 2018-04-06 NOTE — Progress Notes (Signed)
Physical Therapy Treatment Patient Details Name: Michelle Gregory MRN: [id number] DOB: Jul 09, 1923 Today's Date: 04/06/2018    History of Present Illness Pt admitted with confusion and back/L hip pain with hx of fall ~ 1 wk ago.   Bilat nondisplaced sacral fx found on second review of CT scan    PT Comments    Assisted from recliner to [facility] (urinary urgency) then assisted with amb a greater distance in hallway.  Assisted back to bed.   Follow Up Recommendations  SNF     Equipment Recommendations  None recommended by PT    Recommendations for Other Services       Precautions / Restrictions Precautions Precautions: Fall Restrictions Weight Bearing Restrictions: No Other Position/Activity Restrictions: WBAT    Mobility  Bed Mobility Overal bed mobility: Needs Assistance Bed Mobility: Sit to Supine     Supine to sit: Mod assist;+2 for physical assistance;+2 for safety/equipment     General bed mobility comments: assisted back to bed and positioned to comfort  Transfers Overall transfer level: Needs assistance Equipment used: Rolling walker (2 wheeled) Transfers: Sit to/from [facility] Sit to Stand: Mod assist;Max assist;+2 physical assistance;+2 safety/equipment Stand pivot transfers: Mod assist;+2 physical assistance;+2 safety/equipment;Max assist       General transfer comment: + 2 side by side assist from elevated bed to amb  Ambulation/Gait Ambulation/Gait assistance: Max assist;+2 physical assistance;+2 safety/equipment Ambulation Distance (Feet): 24 Feet Assistive device: Rolling walker (2 wheeled) Gait Pattern/deviations: Step-to pattern;Decreased stance time - left Gait velocity: decreased   General Gait Details: + 2 side by side assist with 3rd following with recliner.  Pt able to progress gait with difficulty weight shifting to L and increased pain   Stairs             Wheelchair Mobility    Modified Rankin (Stroke Patients  Only)       Balance                                            Cognition Arousal/Alertness: Awake/alert Behavior During Therapy: WFL for tasks assessed/performed Overall Cognitive Status: Within Functional Limits for tasks assessed                                 General Comments: appears at prior level of cognition      Exercises      General Comments        Pertinent Vitals/Pain Pain Assessment: Faces Faces Pain Scale: Hurts little more Pain Location: B hips Pain Descriptors / Indicators: Grimacing;Guarding Pain Intervention(s): Monitored during session    Home Living                      Prior Function            PT Goals (current goals can now be found in the care plan section) Progress towards PT goals: Progressing toward goals    Frequency    Min 3X/week      PT Plan Current plan remains appropriate    Co-evaluation              AM-PAC PT "6 Clicks" Daily Activity  Outcome Measure  Difficulty turning over in bed (including adjusting bedclothes, sheets and blankets)?: A Lot Difficulty moving from lying on back to sitting  on the side of the bed? : A Lot Difficulty sitting down on and standing up from a chair with arms (e.g., wheelchair, bedside commode, etc,.)?: A Lot Help needed moving to and from a bed to chair (including a wheelchair)?: A Lot Help needed walking in hospital room?: A Lot Help needed climbing 3-5 steps with a railing? : Total 6 Click Score: 11    End of Session Equipment Utilized During Treatment: Gait belt Activity Tolerance: Patient tolerated treatment well Patient left: in bed;with nursing/sitter in room;with bed alarm set Nurse Communication: Mobility status PT Visit Diagnosis: Difficulty in walking, not elsewhere classified (R26.2);History of falling (Z91.81);Unsteadiness on feet (R26.81);Pain Pain - Right/Left: Left Pain - part of body: Leg     Time: 1035-1100 PT Time  Calculation (min) (ACUTE ONLY): 25 min  Charges:  $Gait Training: 8-22 mins $Therapeutic Activity: 8-22 mins                    G Codes:       {Yanelle Sousa  PTA [facility]  Acute  Rehab Pager      [phone]

## 2018-04-06 NOTE — NC FL2 (Addendum)
[place] LEVEL OF CARE SCREENING TOOL     IDENTIFICATION  Patient Name: Michelle Gregory Birthdate: 11/07/1922 Sex: female Admission Date (Current Location): 04/02/2018  [facility] and [place] Number:  Herbalist and Address:  [facility],  [place], [place]      Provider Number: [id number]  Attending Physician Name and Address:  Velvet Bathe, MD  Relative Name and Phone Number:  Coble,[name] Daughter [phone] or Posey Pronto [phone]      Current Level of Care: Hospital Recommended Level of Care: [place] Prior Approval Number:    Date Approved/Denied:   PASRR Number: [id number]  Discharge Plan: SNF    Current Diagnoses: Patient Active Problem List   Diagnosis Date Noted  . Sacral fracture ([place]) 04/03/2018  . Fall at home 03/29/2018  . Anxiety disorder 03/29/2018  . Fatigue 03/29/2018  . Cerumen impaction 12/06/2017  . H/O Clostridium difficile infection 10/13/2017  . Recurrent UTI (urinary tract infection) 09/06/2017  . Auditory hallucinations 03/03/2017  . Weight loss 03/03/2017  . Female cystocele 04/13/2016  . Bowel habit changes 12/30/2015  . Compression fracture 12/30/2015  . Routine general medical examination at a health care facility 04/09/2015  . Candidal intertrigo 03/28/2014  . Encounter for Medicare annual wellness exam 03/21/2013  . Gout 09/19/2012  . Osteopenia 05/05/2011  . Degenerative lumbar disc 01/20/2011  . VARICOSE VEINS, LOWER EXTREMITIES 09/04/2010  . Hyperglycemia 01/08/2010  . Hyperlipidemia 05/09/2008  . Essential hypertension 05/09/2008  . HEMORRHOIDS, INTERNAL 05/09/2008  . Allergic rhinitis 05/09/2008  . Mild reactive airways disease 05/09/2008  . GERD 05/09/2008  . IBS 05/09/2008  . Osteoarthritis 05/09/2008  . History of malignant neoplasm of large intestine 05/09/2008    Orientation RESPIRATION BLADDER Height & Weight      Person,Place,     Incontinent Weight: 118 lb (53.5 kg) Height:  5\' 3"  (160 cm)  BEHAVIORAL SYMPTOMS/MOOD NEUROLOGICAL BOWEL NUTRITION STATUS      Continent Diet(Regular Diet)  AMBULATORY STATUS COMMUNICATION OF NEEDS Skin   Extensive Assist Verbally Normal                       Personal Care Assistance Level of Assistance  Bathing, Feeding, Dressing Bathing Assistance: Limited assistance Feeding assistance: Independent Dressing Assistance: Limited assistance     Functional Limitations Info  Sight, Hearing, Speech Sight Info: Adequate Hearing Info: Adequate Speech Info: Adequate    SPECIAL CARE FACTORS FREQUENCY  PT (By licensed PT), OT (By licensed OT)     PT Frequency: 5x/week OT Frequency: 5x/week            Contractures Contractures Info: Not present    Additional Factors Info  Code Status, Allergies, Psychotropic Code Status Info: Fullcode Allergies Info: Allergies: Amoxicillin-pot Clavulanate, Keflex Cephalexin, Septra Sulfamethoxazole-trimethoprim Psychotropic Info: Seroquel         Current Medications (04/06/2018):  This is the current hospital active medication list Current Facility-Administered Medications  Medication Dose Route Frequency Provider Last Rate Last Dose  . 0.9 %  sodium chloride infusion   Intravenous Continuous Kayleen Memos, DO 50 mL/hr at 04/05/18 2328    . acetaminophen (TYLENOL) tablet 650 mg  650 mg Oral Q6H PRN Mariel Aloe, MD       Or  . acetaminophen (TYLENOL) suppository 650 mg  650 mg Rectal Q6H PRN Mariel Aloe, MD      . ALPRAZolam Duanne Moron) tablet 0.25 mg  0.25 mg Oral BID PRN Mariel Aloe, MD   0.25 mg at 04/05/18 1434  . aspirin EC tablet 81 mg  81 mg Oral Daily Mariel Aloe, MD   81 mg at 04/06/18 0943  . cefTRIAXone (ROCEPHIN) 1 g in sodium chloride 0.9 % 100 mL IVPB  1 g Intravenous Q24H Irene Pap N, DO   Stopped at 04/05/18 1901  . docusate sodium (COLACE) capsule 100 mg  100 mg Oral BID Mariel Aloe, MD   100 mg at 04/06/18 0943  . enoxaparin (LOVENOX) injection 40 mg  40 mg Subcutaneous Q24H Irene Pap N, DO   40 mg at 04/05/18 2211  . gabapentin (NEURONTIN) capsule 300 mg  300 mg Oral BID Mariel Aloe, MD   300 mg at 04/06/18 0943  . HYDROcodone-acetaminophen (NORCO/VICODIN) 5-325 MG per tablet 1 tablet  1 tablet Oral Q4H PRN Mariel Aloe, MD   1 tablet at 04/06/18 0700  . losartan (COZAAR) tablet 100 mg  100 mg Oral Daily Mariel Aloe, MD   100 mg at 04/06/18 0943  . metoprolol tartrate (LOPRESSOR) tablet 50 mg  50 mg Oral BID Mariel Aloe, MD   50 mg at 04/06/18 0943  . morphine 2 MG/ML injection 2 mg  2 mg Intravenous Q4H PRN Mariel Aloe, MD   2 mg at 04/03/18 2122  . PARoxetine (PAXIL) tablet 30 mg  30 mg Oral q morning - 10a Mariel Aloe, MD   30 mg at 04/06/18 0943  . polyethylene glycol (MIRALAX / GLYCOLAX) packet 17 g  17 g Oral Daily PRN Mariel Aloe, MD      . QUEtiapine (SEROQUEL) tablet 25 mg  25 mg Oral BID Irene Pap N, DO   25 mg at 04/06/18 0943  . senna (SENOKOT) tablet 8.6 mg  1 tablet Oral BID Mariel Aloe, MD   8.6 mg at 04/06/18 5035     Discharge Medications: Please see discharge summary for a list of discharge medications.  Relevant Imaging Results:  Relevant Lab Results:   Additional Information SSN [id number]  Lia Hopping, LCSW

## 2018-04-06 NOTE — Progress Notes (Signed)
Physical Therapy Treatment Patient Details Name: Michelle Gregory MRN: [id number] DOB: October 06, 1923 Today's Date: 04/06/2018    History of Present Illness Pt admitted with confusion and back/L hip pain with hx of fall ~ 1 wk ago.   Bilat nondisplaced sacral fx found on second review of CT scan    PT Comments    Assisted OOB to amb a limited distance required + 2 side by side assist and third following with recliner.    Follow Up Recommendations  SNF     Equipment Recommendations  None recommended by PT    Recommendations for Other Services       Precautions / Restrictions Precautions Precautions: Fall Restrictions Weight Bearing Restrictions: No Other Position/Activity Restrictions: WBAT    Mobility  Bed Mobility   Bed Mobility: Supine to Sit     Supine to sit: Mod assist;+2 for physical assistance;+2 for safety/equipment     General bed mobility comments: increased time and extra assist to complete scooting to EOB    limited sitting tolerance EOB due to increased pain.    Transfers Overall transfer level: Needs assistance Equipment used: Rolling walker (2 wheeled) Transfers: Sit to/from [facility] Sit to Stand: Mod assist;Max assist;+2 physical assistance;+2 safety/equipment Stand pivot transfers: Mod assist;+2 physical assistance;+2 safety/equipment;Max assist       General transfer comment: + 2 side by side assist from elevated bed to amb  Ambulation/Gait Ambulation/Gait assistance: Max assist;+2 physical assistance;+2 safety/equipment Ambulation Distance (Feet): 18 Feet Assistive device: Rolling walker (2 wheeled) Gait Pattern/deviations: Step-to pattern;Decreased stance time - left Gait velocity: decreased   General Gait Details: + 2 side by side assist with 3rd following with recliner.  Pt able to progress gait with difficulty weight shifting to L and increased pain   Stairs             Wheelchair Mobility    Modified Rankin  (Stroke Patients Only)       Balance                                            Cognition Arousal/Alertness: Awake/alert Behavior During Therapy: WFL for tasks assessed/performed Overall Cognitive Status: Within Functional Limits for tasks assessed                                 General Comments: appears at prior level of cognition      Exercises      General Comments        Pertinent Vitals/Pain Pain Assessment: Faces Faces Pain Scale: Hurts little more Pain Location: B hips L>R Pain Descriptors / Indicators: Grimacing;Guarding Pain Intervention(s): Monitored during session;Repositioned;Ice applied    Home Living                      Prior Function            PT Goals (current goals can now be found in the care plan section) Progress towards PT goals: Progressing toward goals    Frequency    Min 3X/week      PT Plan Current plan remains appropriate    Co-evaluation              AM-PAC PT "6 Clicks" Daily Activity  Outcome Measure  Difficulty turning over in bed (including adjusting bedclothes,  sheets and blankets)?: A Lot Difficulty moving from lying on back to sitting on the side of the bed? : A Lot Difficulty sitting down on and standing up from a chair with arms (e.g., wheelchair, bedside commode, etc,.)?: A Lot Help needed moving to and from a bed to chair (including a wheelchair)?: A Lot Help needed walking in hospital room?: A Lot Help needed climbing 3-5 steps with a railing? : Total 6 Click Score: 11    End of Session Equipment Utilized During Treatment: Gait belt Activity Tolerance: Patient tolerated treatment well Patient left: in chair;with chair alarm set;Other (comment) Nurse Communication: Mobility status PT Visit Diagnosis: Difficulty in walking, not elsewhere classified (R26.2);History of falling (Z91.81);Unsteadiness on feet (R26.81);Pain Pain - Right/Left: Left Pain - part of body:  Leg     Time: 1035-1100 PT Time Calculation (min) (ACUTE ONLY): 25 min  Charges:  $Gait Training: 8-22 mins $Therapeutic Activity: 8-22 mins                    G Codes:       Rica Koyanagi  PTA [facility]  Acute  Rehab Pager      [phone]

## 2018-04-06 NOTE — Progress Notes (Signed)
PROGRESS NOTE  Michelle Gregory OMV:[id number] DOB: 03-08-1923 DOA: 04/02/2018 PCP: Abner Greenspan, MD  HPI/Recap of past 24 hours: Michelle Gregory is a 84 y.o. female with medical history significant of hypertension, anxiety, hyperlipidemia, reactive airway disease, GERD, osteoarthritis, colon cancer, C. difficile infection.  Patient presented to the emergency department secondary to worsening back pain.  Patient is confused and is unable to give a full history.  History is obtained from patient, family, chart review.  Patient suffered a fall few weeks ago and was seen at her primary care physician's office and cleared.  However, her pain worsened and she presented to the emergency department for evaluation.  Per her daughter, patient used to walk on her own.  She has a walker, but rarely uses it.  Pt has been confused 2ary to UTI.  Assessment/Plan: Principal Problem:   Sacral fracture (HCC) Active Problems:   Hyperlipidemia   Essential hypertension   Anxiety disorder  AMS, resolving Acute metabolic encephalopathy suspect 2/2 to UTI Urine cx CT head no contrast personally reviewed unremarkable Fall precautions Reorient as needed Pt was Started seroquel 25 mg bid for agitation  UTI Continue IV ceftriaxone Urine cx suggests recollection  Sacral fractures post fall Appreciate ortho recommendations WBAT PT  Fall precautions Pt has pain medication regimen prn  HTN, stable Added prn hydralazine medication.   Acute Dehydration - continue normal saline infusion.  Encourage oral fluid intake  Leukocytosis, resolved Possibly from UTI vs reactive procalcitonin <0.10  Hx of cdiff Positive stool 10/13/17  Code Status: full  Family Communication: daughter on the phone on 04/04/18  Disposition Plan: Home vs SNF when clinically stable.   Consultants:  None  Procedures:  NOne  Antimicrobials:  IV ceftriaxone  DVT prophylaxis:  sq lovenox   Objective: Vitals:   04/05/18 0545 04/05/18 1301 04/05/18 2223 04/06/18 0610  BP: (!) 155/90 (!) 141/91 139/82 (!) 182/87  Pulse: 89 89 97 86  Resp: 18 14 20 20   Temp: 98.3 F (36.8 C) 98.2 F (36.8 C) 98.1 F (36.7 C) 98.2 F (36.8 C)  TempSrc: Oral Oral Oral Oral  SpO2: 97% 97% 99% 93%  Weight:      Height:        Intake/Output Summary (Last 24 hours) at 04/06/2018 1337 Last data filed at 04/06/2018 1100 Gross per 24 hour  Intake 1900 ml  Output 950 ml  Net 950 ml   Filed Weights   04/02/18 1645  Weight: 53.5 kg (118 lb)    Exam:  . General: 83 y.o. year-old female WD wN nAD A&O x 2 . Cardiovascular: RRR no rubs or gallops. No JVD or thyromegaly.   Marland Kitchen Respiratory: Clear to auscultation with no wheezes or rales. Equal chest rise. . Abdomen: Soft nontender nondistended with normal bowel sounds x4 quadrants. . Musculoskeletal: No lower extremity edema. 2/4 pulses in all 4 extremities. . Skin: bruising on RLE anteriorly. Marland Kitchen Psychiatry: Mood and affect appropriate.   Data Reviewed: CBC: Recent Labs  Lab 03/31/18 1716 04/02/18 1754 04/04/18 0608 04/05/18 0539 04/06/18 0529  WBC 9.6 8.0 16.5* 7.6 6.6  NEUTROABS 7.5 6.3  --   --   --   HGB 13.2 13.7 13.6 13.3 12.7  HCT 37.7 40.6 39.5 39.4 38.0  MCV 95.2 95.5 93.6 97.3 96.2  PLT 47* 101* 145* 112* 628*   Basic Metabolic Panel: Recent Labs  Lab 03/31/18 1716 04/02/18 1754 04/05/18 0539 04/06/18 0529  NA 134* 139 141 142  K  4.2 3.9 3.7 3.8  CL 98* 99* 103 105  CO2 24 28 29 29   GLUCOSE 113* 115* 98 95  BUN 26* 18 28* 18  CREATININE 0.74 0.73 0.77 0.62  CALCIUM 9.0 9.1 8.6* 8.6*  MG  --   --   --  1.5*   GFR: Estimated Creatinine Clearance: 36.3 mL/min (by C-G formula based on SCr of 0.62 mg/dL). Liver Function Tests: Recent Labs  Lab 04/06/18 0529  AST 18  ALT 13*  ALKPHOS 79  BILITOT 0.6  PROT 5.6*  ALBUMIN 3.0*   No results for input(s): LIPASE, AMYLASE in the last 168 hours. No results for input(s): AMMONIA in the  last 168 hours. Coagulation Profile: No results for input(s): INR, PROTIME in the last 168 hours. Cardiac Enzymes: No results for input(s): CKTOTAL, CKMB, CKMBINDEX, TROPONINI in the last 168 hours. BNP (last 3 results) No results for input(s): PROBNP in the last 8760 hours. HbA1C: No results for input(s): HGBA1C in the last 72 hours. CBG: No results for input(s): GLUCAP in the last 168 hours. Lipid Profile: No results for input(s): CHOL, HDL, LDLCALC, TRIG, CHOLHDL, LDLDIRECT in the last 72 hours. Thyroid Function Tests: No results for input(s): TSH, T4TOTAL, FREET4, T3FREE, THYROIDAB in the last 72 hours. Anemia Panel: No results for input(s): VITAMINB12, FOLATE, FERRITIN, TIBC, IRON, RETICCTPCT in the last 72 hours. Urine analysis:    Component Value Date/Time   COLORURINE YELLOW 04/04/2018 [place] 04/04/2018 1017   LABSPEC 1.012 04/04/2018 1017   PHURINE 5.0 04/04/2018 1017   GLUCOSEU NEGATIVE 04/04/2018 1017   HGBUR MODERATE (A) 04/04/2018 1017   HGBUR trace-lysed 12/12/2010 1509   BILIRUBINUR NEGATIVE 04/04/2018 1017   BILIRUBINUR 1+ 03/25/2018 1520   KETONESUR NEGATIVE 04/04/2018 1017   PROTEINUR NEGATIVE 04/04/2018 1017   UROBILINOGEN 1.0 03/25/2018 1520   UROBILINOGEN 0.2 12/12/2010 1509   NITRITE NEGATIVE 04/04/2018 1017   LEUKOCYTESUR MODERATE (A) 04/04/2018 1017   Sepsis Labs: @LABRCNTIP (procalcitonin:4,lacticidven:4)  ) Recent Results (from the past 240 hour(s))  Urine culture     Status: None   Collection Time: 03/31/18  5:53 PM  Result Value Ref Range Status   Specimen Description   Final    URINE, CATHETERIZED Performed at [facility], [place] [place], [place] [address]    Special Requests   Final    NONE Performed at [facility], [address] [place], [place] [address]    Culture   Final    NO GROWTH Performed at [facility], [place] [address] [place], [place] [address]    Report Status  04/01/2018 FINAL  Final  Culture, Urine     Status: Abnormal   Collection Time: 04/04/18 10:16 AM  Result Value Ref Range Status   Specimen Description   Final    URINE, RANDOM Performed at [facility] [address] [place], [place] [address]    Special Requests   Final    NONE Performed at [facility], [place] [address] [place], [place] [address]    Culture MULTIPLE SPECIES PRESENT, SUGGEST RECOLLECTION (A)  Final   Report Status 04/05/2018 FINAL  Final      Studies: No results found.  Scheduled Meds: . aspirin EC  81 mg Oral Daily  . docusate sodium  100 mg Oral BID  . enoxaparin (LOVENOX) injection  40 mg Subcutaneous Q24H  . gabapentin  300 mg Oral BID  .  losartan  100 mg Oral Daily  . metoprolol tartrate  50 mg Oral BID  . PARoxetine  30 mg Oral q morning - 10a  . QUEtiapine  25 mg Oral BID  . senna  1 tablet Oral BID    Continuous Infusions: . sodium chloride 50 mL/hr at 04/05/18 2328  . cefTRIAXone (ROCEPHIN)  IV Stopped (04/05/18 1901)     LOS: 2 days    Velvet Bathe, MD [facility] Pager [phone]  If 7PM-7AM, please contact night-coverage [email or web address] Password [email or web address] 04/06/2018, 1:37 PM

## 2018-04-07 DIAGNOSIS — Z8619 Personal history of other infectious and parasitic diseases: Secondary | ICD-10-CM | POA: Diagnosis not present

## 2018-04-07 DIAGNOSIS — R52 Pain, unspecified: Secondary | ICD-10-CM | POA: Diagnosis not present

## 2018-04-07 DIAGNOSIS — E785 Hyperlipidemia, unspecified: Secondary | ICD-10-CM | POA: Diagnosis not present

## 2018-04-07 DIAGNOSIS — Z9181 History of falling: Secondary | ICD-10-CM | POA: Diagnosis not present

## 2018-04-07 DIAGNOSIS — M6281 Muscle weakness (generalized): Secondary | ICD-10-CM | POA: Diagnosis not present

## 2018-04-07 DIAGNOSIS — A0472 Enterocolitis due to Clostridium difficile, not specified as recurrent: Secondary | ICD-10-CM | POA: Diagnosis not present

## 2018-04-07 DIAGNOSIS — M545 Low back pain: Secondary | ICD-10-CM | POA: Diagnosis not present

## 2018-04-07 DIAGNOSIS — K5900 Constipation, unspecified: Secondary | ICD-10-CM | POA: Diagnosis not present

## 2018-04-07 DIAGNOSIS — R1312 Dysphagia, oropharyngeal phase: Secondary | ICD-10-CM | POA: Diagnosis not present

## 2018-04-07 DIAGNOSIS — N390 Urinary tract infection, site not specified: Secondary | ICD-10-CM | POA: Diagnosis not present

## 2018-04-07 DIAGNOSIS — M1000 Idiopathic gout, unspecified site: Secondary | ICD-10-CM | POA: Diagnosis not present

## 2018-04-07 DIAGNOSIS — S3210XA Unspecified fracture of sacrum, initial encounter for closed fracture: Principal | ICD-10-CM

## 2018-04-07 DIAGNOSIS — G4700 Insomnia, unspecified: Secondary | ICD-10-CM | POA: Diagnosis not present

## 2018-04-07 DIAGNOSIS — I10 Essential (primary) hypertension: Secondary | ICD-10-CM | POA: Diagnosis not present

## 2018-04-07 DIAGNOSIS — F419 Anxiety disorder, unspecified: Secondary | ICD-10-CM | POA: Diagnosis not present

## 2018-04-07 DIAGNOSIS — R410 Disorientation, unspecified: Secondary | ICD-10-CM | POA: Diagnosis not present

## 2018-04-07 DIAGNOSIS — F329 Major depressive disorder, single episode, unspecified: Secondary | ICD-10-CM | POA: Diagnosis not present

## 2018-04-07 DIAGNOSIS — R454 Irritability and anger: Secondary | ICD-10-CM | POA: Diagnosis not present

## 2018-04-07 DIAGNOSIS — R2689 Other abnormalities of gait and mobility: Secondary | ICD-10-CM | POA: Diagnosis not present

## 2018-04-07 DIAGNOSIS — Z4789 Encounter for other orthopedic aftercare: Secondary | ICD-10-CM | POA: Diagnosis not present

## 2018-04-07 DIAGNOSIS — Z7409 Other reduced mobility: Secondary | ICD-10-CM | POA: Diagnosis not present

## 2018-04-07 DIAGNOSIS — S0990XA Unspecified injury of head, initial encounter: Secondary | ICD-10-CM | POA: Diagnosis not present

## 2018-04-07 DIAGNOSIS — S3210XD Unspecified fracture of sacrum, subsequent encounter for fracture with routine healing: Secondary | ICD-10-CM | POA: Diagnosis not present

## 2018-04-07 DIAGNOSIS — R278 Other lack of coordination: Secondary | ICD-10-CM | POA: Diagnosis not present

## 2018-04-07 DIAGNOSIS — M1990 Unspecified osteoarthritis, unspecified site: Secondary | ICD-10-CM | POA: Diagnosis not present

## 2018-04-07 MED ORDER — HYDROCODONE-ACETAMINOPHEN 5-325 MG PO TABS: 1.0000 | ORAL_TABLET | ORAL | 0 refills | Status: DC | PRN

## 2018-04-07 MED ORDER — CEFDINIR 300 MG PO CAPS: 300.0000 mg | ORAL_CAPSULE | Freq: Two times a day (BID) | ORAL | 0 refills | Status: AC

## 2018-04-07 MED ORDER — ALPRAZOLAM 0.5 MG PO TABS: ORAL_TABLET | ORAL | 0 refills | Status: DC

## 2018-04-07 NOTE — Clinical Social Work Placement (Signed)
   CLINICAL SOCIAL WORK PLACEMENT  NOTE  Date:  04/07/2018  Patient Details  Name: GWYNDOLYN GUILFORD MRN: [id number] Date of Birth: 1923-03-10  Clinical Social Work is seeking post-discharge placement for this patient at the [place] level of care (*CSW will initial, date and re-position this form in  chart as items are completed):  Yes   Patient/family provided with [place] Work Department's list of facilities offering this level of care within the geographic area requested by the patient (or if unable, by the patient's family).  Yes   Patient/family informed of their freedom to choose among providers that offer the needed level of care, that participate in Medicare, Medicaid or managed care program needed by the patient, have an available bed and are willing to accept the patient.  Yes   Patient/family informed of [facility]'s ownership interest in [facility] and [facility], as well as of the fact that they are under no obligation to receive care at these facilities.  PASRR submitted to EDS on       PASRR number received on       Existing PASRR number confirmed on 04/06/18     FL2 transmitted to all facilities in geographic area requested by pt/family on       FL2 transmitted to all facilities within larger geographic area on 04/07/18     Patient informed that his/her managed care company has contracts with or will negotiate with certain facilities, including the following:        Yes   Patient/family informed of bed offers received.  Patient chooses bed at [facility]     Physician recommends and patient chooses bed at      Patient to be transferred to [facility] on 04/07/18.  Patient to be transferred to facility by Family personal care     Patient family notified on 04/07/18 of transfer.  Name of family member notified:  Daughter/Son in law     PHYSICIAN Please prepare priority discharge summary, including medications,  Please sign FL2     Additional Comment:    _______________________________________________ Lia Hopping, LCSW 04/07/2018, 12:26 PM

## 2018-04-07 NOTE — Progress Notes (Signed)
Physical Therapy Treatment Patient Details Name: Michelle Gregory MRN: [id number] DOB: 1922/11/23 Today's Date: 04/07/2018    History of Present Illness Pt admitted with confusion and back/L hip pain with hx of fall ~ 1 wk ago.   Bilat nondisplaced sacral fx found on second review of CT scan    PT Comments    Assisted OOB to wheelchair then from wheelchair to personal vehicle.  Instructed on safety with turns and positioning.    Follow Up Recommendations  SNF     Equipment Recommendations       Recommendations for Other Services       Precautions / Restrictions Precautions Precautions: Fall Restrictions Weight Bearing Restrictions: No Other Position/Activity Restrictions: WBAT    Mobility  Bed Mobility Overal bed mobility: Needs Assistance Bed Mobility: Supine to Sit     Supine to sit: Mod assist;Max assist     General bed mobility comments: assisted with increased time to EOB due to pain  Transfers Overall transfer level: Needs assistance Equipment used: None Transfers: Sit to/from Stand;Stand Pivot Transfers Sit to Stand: Min assist;Mod assist Stand pivot transfers: Mod assist       General transfer comment: + 2 side by side assist from elevated bed to wheelchair with increased time due to pain level and pt fear of falling    Also assisted from wc to personal vehicle.     Ambulation/Gait             General Gait Details: did not amb this session   Stairs             Wheelchair Mobility    Modified Rankin (Stroke Patients Only)       Balance                                            Cognition Arousal/Alertness: Awake/alert Behavior During Therapy: WFL for tasks assessed/performed Overall Cognitive Status: Within Functional Limits for tasks assessed                                 General Comments: appears at prior level of cognition      Exercises      General Comments        Pertinent  Vitals/Pain Pain Assessment: Faces Faces Pain Scale: Hurts even more Pain Location: B hips Pain Descriptors / Indicators: Grimacing;Guarding Pain Intervention(s): Monitored during session;Repositioned    Home Living                      Prior Function            PT Goals (current goals can now be found in the care plan section) Progress towards PT goals: Progressing toward goals    Frequency    Min 3X/week      PT Plan Current plan remains appropriate    Co-evaluation              AM-PAC PT "6 Clicks" Daily Activity  Outcome Measure  Difficulty turning over in bed (including adjusting bedclothes, sheets and blankets)?: A Lot Difficulty moving from lying on back to sitting on the side of the bed? : A Lot Difficulty sitting down on and standing up from a chair with arms (e.g., wheelchair, bedside commode, etc,.)?: A Lot Help needed moving to  and from a bed to chair (including a wheelchair)?: A Lot Help needed walking in hospital room?: A Lot Help needed climbing 3-5 steps with a railing? : Total 6 Click Score: 11    End of Session Equipment Utilized During Treatment: Gait belt Activity Tolerance: Patient tolerated treatment well Patient left: Other (comment)(personal vehicle with family to transport to SNF)   PT Visit Diagnosis: Difficulty in walking, not elsewhere classified (R26.2);History of falling (Z91.81);Unsteadiness on feet (R26.81);Pain     Time: 9509-3267 PT Time Calculation (min) (ACUTE ONLY): 25 min  Charges:  $Therapeutic Activity: 23-37 mins                    G Codes:       Rica Koyanagi  PTA [facility]  Acute  Rehab Pager      [phone]

## 2018-04-07 NOTE — Progress Notes (Signed)
Attempted to call report to [facility] for patient. Will continue to monitor.

## 2018-04-07 NOTE — Progress Notes (Signed)
Patient discharging to [facility]. Patient's family transporting patient. Patient's RN provided patient with packet. Patient's RN can call report to [phone] Room 601. CSW signing off, no other needs identified at this time.  Abundio Miu, [place] Social Worker [facility] Cell#: [phone]

## 2018-04-07 NOTE — Discharge Summary (Addendum)
Physician Discharge Summary  Michelle Gregory UJW:[id number] DOB: [birth year] DOA: 04/02/2018  PCP: Abner Greenspan, MD  Admit date: 04/02/2018 Discharge date: 04/07/2018  Time spent: 35 minutes  Recommendations for Outpatient Follow-up:  1. Pt will be discharged on 4 more days of abx's   Discharge Diagnoses:  Principal Problem:   Sacral fracture ([place]) Active Problems:   Hyperlipidemia   Essential hypertension   Anxiety disorder   Discharge Condition: stable  Diet recommendation: regular  Filed Weights   04/02/18 1645  Weight: 53.5 kg (118 lb)    History of present illness:  84 y.o.femalewith medical history significant ofhypertension, anxiety, hyperlipidemia, reactive airway disease, GERD, osteoarthritis, colon cancer, C. difficile infection.Patient presented to the emergency department secondary to worsening back pain  Hospital Course:   AMS Acute metabolic encephalopathy suspect 2/2 to UTI Urine cx CT head no contrast personally reviewed unremarkable - AMS resolved with antibiotic treatment as such suspect it was secondary to infectious etiology (UTI)  UTI Was treated with Rocephin for 3 days. Will treat for 4 days to complete a 7 day treatment course.   Sacral fractures post fall Appreciate ortho recommendations WBAT PT  Fall precautions Pt has pain medication regimen prn  HTN, stable Continue prior to admission medication regimen.   Acute Dehydration - Resolved after ICF rehydration  Leukocytosis, resolved - resolved and most likely due to UTI  Hx of cdiff Positive stool 10/13/17 - no diarrhea reported today.  Procedures:  none  Consultations:  None  Discharge Exam: Vitals:   04/07/18 0514 04/07/18 0838  BP: (!) 170/84 (!) 170/90  Pulse: 94 86  Resp: 20   Temp: 98.6 F (37 C)   SpO2: 94%     General: Pt in nad, alert and awake Cardiovascular: rrr, no rubs Respiratory: no increased wob, no wheezes  Discharge  Instructions   Discharge Instructions    Call MD for:  severe uncontrolled pain   Complete by:  As directed    Call MD for:  temperature >100.4   Complete by:  As directed    Diet - low sodium heart healthy   Complete by:  As directed    Discharge instructions   Complete by:  As directed    Continue pain control and will treat UTI for 4 more days to complete a 7 day treatment course.   Increase activity slowly   Complete by:  As directed      Allergies as of 04/07/2018      Reactions   Amoxicillin-pot Clavulanate Diarrhea   Has patient had a PCN reaction causing immediate rash, facial/tongue/throat swelling, SOB or lightheadedness with hypotension: Yes Has patient had a PCN reaction causing severe rash involving mucus membranes or skin necrosis: No Has patient had a PCN reaction that required hospitalization: No Has patient had a PCN reaction occurring within the last 10 years: No If all of the above answers are "NO", then may proceed with Cephalosporin use.   Keflex [cephalexin] Diarrhea   Septra [sulfamethoxazole-trimethoprim] Nausea Only      Medication List    STOP taking these medications   HYDROcodone-acetaminophen 10-325 MG tablet Commonly known as:  NORCO Replaced by:  HYDROcodone-acetaminophen 5-325 MG tablet   pregabalin 50 MG capsule Commonly known as:  LYRICA     TAKE these medications   allopurinol 100 MG tablet Commonly known as:  ZYLOPRIM TAKE 2 TABLETS BY MOUTH  DAILY   ALPRAZolam 0.5 MG tablet Commonly known as:  XANAX 1/2 pill by  mouth up to twice daily as needed for anxiety   aspirin 81 MG tablet Take 81 mg by mouth daily.   calcium-vitamin D 500 MG tablet Take 1 tablet by mouth daily.   cefdinir 300 MG capsule Commonly known as:  OMNICEF Take 1 capsule (300 mg total) by mouth 2 (two) times daily for 4 days.   CRANBERRY PO Take 1 capsule by mouth 2 (two) times daily.   gabapentin 300 MG capsule Commonly known as:  NEURONTIN Take 300 mg  by mouth 2 (two) times daily.   glucosamine-chondroitin 500-400 MG tablet Take 1 tablet by mouth daily.   HYDROcodone-acetaminophen 5-325 MG tablet Commonly known as:  NORCO/VICODIN Take 1 tablet by mouth every 4 (four) hours as needed for moderate pain. Replaces:  HYDROcodone-acetaminophen 10-325 MG tablet   losartan 100 MG tablet Commonly known as:  COZAAR TAKE 1 TABLET BY MOUTH  DAILY   meclizine 25 MG tablet Commonly known as:  ANTIVERT TAKE 1 TABLET (25 MG TOTAL) BY MOUTH 3 (THREE) TIMES  DAILY AS NEEDED.   metoprolol tartrate 50 MG tablet Commonly known as:  LOPRESSOR Take 1 tablet (50 mg total) by mouth 2 (two) times daily.   multivitamin per tablet Take 1 tablet by mouth daily.   PARoxetine 30 MG tablet Commonly known as:  PAXIL TAKE 1 TABLET BY MOUTH  EVERY MORNING   PROBIOTIC DAILY PO Take 1 tablet by mouth daily.   raloxifene 60 MG tablet Commonly known as:  EVISTA TAKE 1 TABLET BY MOUTH DAILY.   vitamin C 100 MG tablet Take 100 mg by mouth daily.      Allergies  Allergen Reactions  . Amoxicillin-Pot Clavulanate Diarrhea    Has patient had a PCN reaction causing immediate rash, facial/tongue/throat swelling, SOB or lightheadedness with hypotension: Yes Has patient had a PCN reaction causing severe rash involving mucus membranes or skin necrosis: No Has patient had a PCN reaction that required hospitalization: No Has patient had a PCN reaction occurring within the last 10 years: No If all of the above answers are "NO", then may proceed with Cephalosporin use.   Marland Kitchen Keflex [Cephalexin] Diarrhea  . Septra [Sulfamethoxazole-Trimethoprim] Nausea Only   Contact information for after-discharge care    Destination    [facility] .   Service:  Skilled Nursing Contact information: [address] [place] [phone]               The results of significant diagnostics from this hospitalization (including  imaging, microbiology, ancillary and laboratory) are listed below for reference.    Significant Diagnostic Studies: Ct Head Wo Contrast  Result Date: 04/04/2018 CLINICAL DATA:  84 year old female with confusion, recent fall. Increasing back pain. EXAM: CT HEAD WITHOUT CONTRAST TECHNIQUE: Contiguous axial images were obtained from the base of the skull through the vertex without intravenous contrast. COMPARISON:  Head CT without contrast 12/28/2008. FINDINGS: Brain: Mild generalized cerebral volume loss since 2010. Mild for age patchy and scattered bilateral cerebral white matter hypodensity. No midline shift, ventriculomegaly, mass effect, evidence of mass lesion, intracranial hemorrhage or evidence of cortically based acute infarction. No cortical encephalomalacia identified. Vascular: Calcified atherosclerosis at the skull base. No suspicious intracranial vascular hyperdensity. Skull: No acute osseous abnormality identified. Osteopenia and hyperostosis frontalis. Sinuses/Orbits: Visualized paranasal sinuses and mastoids are stable and well pneumatized. Other: Stable orbits soft tissues. Scalp soft tissues are stable and within normal limits. IMPRESSION: 1. No acute intracranial abnormality. No acute traumatic injury  identified. 2. Mild for age white matter changes. Electronically Signed   By: Genevie Ann M.D.   On: 04/04/2018 10:27   Ct Thoracic Spine Wo Contrast  Result Date: 04/02/2018 CLINICAL DATA:  84 year old who fell last week at home, presenting today not able to ambulate, a new finding. Personal history of multiple thoracic compression fractures and a prior L2 compression fracture with augmentation. CT requested to determine if there are acute fractures accounting for the new symptoms. EXAM: CT THORACIC SPINE WITHOUT CONTRAST TECHNIQUE: Multidetector CT images of the thoracic were obtained using the standard protocol without intravenous contrast. COMPARISON:  Bone window images from CT abdomen and  pelvis 10/13/2017. Thoracic spine x-rays 11/07/2010. No interval thoracic spine imaging in the [facility]. FINDINGS: Alignment: Anatomic POSTERIOR alignment with the exception of retropulsion of the bone fragment at T9 related to a remote fracture. Vertebrae: Remote T9 compression fracture nearing 100% with retropulsion of the POSTERIOR bone fragment and ANTERIOR displacement of the ANTERIOR bone fragment, unchanged in appearance since the CT bone window images in December, 2018. The compression fracture of the UPPER endplate of V40 on the order of 60% or so is unchanged dating back to the 2012 x-rays. No new/acute thoracic spine compression fractures. Generalized osseous demineralization as noted previously. Paraspinal and other soft tissues: No paraspinous or canal hematoma. Disc levels: Well-preserved disc spaces throughout the thoracic spine. Mild spinal stenosis at the T9 level as result of the retropulsed bone fragment. No evidence of spinal stenosis elsewhere. Note is made of degenerative disc disease and spondylosis at the C5-6 level. IMPRESSION: 1. No acute thoracic spine compression fractures. 2. Remote compression fractures of T9 and T12, unchanged since the prior CT in December, 2018. Electronically Signed   By: Evangeline Dakin M.D.   On: 04/02/2018 18:01   Ct Lumbar Spine Wo Contrast  Result Date: 04/02/2018 CLINICAL DATA:  84 year old who fell last week at home, presenting today not able to ambulate, a new finding. Personal history of multiple thoracic compression fractures and a prior L2 compression fracture with augmentation. CT requested to determine if there are acute fractures accounting for the new symptoms. EXAM: CT LUMBAR SPINE WITHOUT CONTRAST TECHNIQUE: Multidetector CT imaging of the lumbar spine was performed without intravenous contrast administration. Multiplanar CT image reconstructions were also generated. COMPARISON:  Bone window images from CT abdomen and pelvis 10/13/2017  and earlier. MRI lumbar spine 02/24/2011. FINDINGS: Segmentation: 5 non-rib-bearing lumbar vertebrae. Alignment: Anatomic POSTERIOR alignment from L3 through S1. Likely as a result of the L2 fracture and the severe facet degenerative changes there is grade 1-2 spondylolisthesis at L2-3 measuring approximately 11 mm, unchanged from the most recent prior CT in December, 2018. Vertebrae: Osseous demineralization. Compression fracture of the L2 vertebral body nearly 100%, with prior augmentation and retropulsion of the POSTERIOR fragment, unchanged since December, 2018. No new/acute lumbar compression fractures. Paraspinal and other soft tissues: No paraspinous or canal hematoma. Disc levels: Severe multifactorial spinal stenosis at L2-3, due to the retropulsed bone fragment and severe facet degenerative changes. Moderate multifactorial spinal stenosis at L3-4 and L4-5. IMPRESSION: 1. No acute lumbar spine compression fractures. 2. Remote L2 compression fracture with augmentation, unchanged since December, 2018. 3. Grade 1-2 spondylolisthesis of L2 on L3 measuring approximately 11 mm, also unchanged. 4. Multilevel multifactorial spinal stenosis, greatest at L2-3. Electronically Signed   By: Evangeline Dakin M.D.   On: 04/02/2018 18:11   Ct Hip Left Wo Contrast  Addendum Date: 04/03/2018   ADDENDUM REPORT:  04/03/2018 10:43 ADDENDUM: This addendum is given as the patient has nondisplaced bilateral sacral fractures which were not described in the initially dictated report. The fractures are acute. Electronically Signed   By: Inge Rise M.D.   On: 04/03/2018 10:43   Result Date: 04/03/2018 CLINICAL DATA:  Status post fall last week, with left hip pain. Initial encounter. EXAM: CT OF THE LEFT HIP WITHOUT CONTRAST TECHNIQUE: Multidetector CT imaging of the left hip was performed according to the standard protocol. Multiplanar CT image reconstructions were also generated. COMPARISON:  CT of the abdomen and pelvis  performed 10/13/2017, and left hip radiographs performed earlier today at 8:23 p.m. FINDINGS: Bones/Joint/Cartilage There is no evidence of fracture or dislocation. The proximal left femur appears intact. The left femoral head remains seated at the acetabulum. The visualized portions of the left sacroiliac joint and pubic symphysis are grossly unremarkable. There is mild osteopenia of visualized osseous structures. No hip joint effusion is seen. The cartilage is not well assessed on CT. Ligaments Suboptimally assessed by CT. Muscles and Tendons The visualized musculature is unremarkable in appearance. No focal tendon abnormalities are seen. Soft tissues No significant soft tissue hematoma is identified. The patient's surgical device at the pelvis appears laterally tilted in comparison to prior studies. Presacral stranding is nonspecific in appearance. This may reflect sequelae of prior proctitis. IMPRESSION: 1. No evidence of fracture or dislocation. 2. Surgical device at the pelvis appears laterally tilted in comparison to prior studies. Would correlate clinically for expected positioning. 3. Presacral stranding is nonspecific in appearance. This may reflect sequelae of prior proctitis. 4. Mild osteopenia of visualized osseous structures. Electronically Signed: By: Garald Balding M.D. On: 04/03/2018 00:36   Dg Hip Unilat W Or Wo Pelvis 2-3 Views Left  Result Date: 04/02/2018 CLINICAL DATA:  Fall last week.  Worsening left hip pain. EXAM: DG HIP (WITH OR WITHOUT PELVIS) 2-3V LEFT COMPARISON:  10/13/2017 CT abdomen/pelvis. FINDINGS: No pelvic fracture or diastasis. No left hip fracture or dislocation. No suspicious focal osseous lesions. Mild degenerative changes in the weight-bearing portions of both hip joints. Degenerative changes in the visualized lower lumbar spine. Surgical suture line overlies the upper sacrum. Stable metallic device overlying the pubic symphysis. IMPRESSION: No fracture.  No left hip  malalignment. Electronically Signed   By: Ilona Sorrel M.D.   On: 04/02/2018 20:45    Microbiology: Recent Results (from the past 240 hour(s))  Urine culture     Status: None   Collection Time: 03/31/18  5:53 PM  Result Value Ref Range Status   Specimen Description   Final    URINE, CATHETERIZED Performed at [facility], [address] [place], [place] [address]    Special Requests   Final    NONE Performed at [facility], [address] [place], [place] [address]    Culture   Final    NO GROWTH Performed at [facility], [place] [address], [place], [place] [address]    Report Status 04/01/2018 FINAL  Final  Culture, Urine     Status: Abnormal   Collection Time: 04/04/18 10:16 AM  Result Value Ref Range Status   Specimen Description   Final    URINE, RANDOM Performed at [facility] [address] [place], [place] [address]    Special Requests   Final    NONE Performed at [facility], [place] [address], [place],  [address]    Culture  MULTIPLE SPECIES PRESENT, SUGGEST RECOLLECTION (A)  Final   Report Status 04/05/2018 FINAL  Final     Labs: Basic Metabolic Panel: Recent Labs  Lab 03/31/18 1716 04/02/18 1754 04/05/18 0539 04/06/18 0529  NA 134* 139 141 142  K 4.2 3.9 3.7 3.8  CL 98* 99* 103 105  CO2 24 28 29 29   GLUCOSE 113* 115* 98 95  BUN 26* 18 28* 18  CREATININE 0.74 0.73 0.77 0.62  CALCIUM 9.0 9.1 8.6* 8.6*  MG  --   --   --  1.5*   Liver Function Tests: Recent Labs  Lab 04/06/18 0529  AST 18  ALT 13*  ALKPHOS 79  BILITOT 0.6  PROT 5.6*  ALBUMIN 3.0*   No results for input(s): LIPASE, AMYLASE in the last 168 hours. No results for input(s): AMMONIA in the last 168 hours. CBC: Recent Labs  Lab 03/31/18 1716 04/02/18 1754 04/04/18 0608 04/05/18 0539 04/06/18 0529  WBC 9.6 8.0 16.5* 7.6 6.6  NEUTROABS 7.5 6.3  --   --   --   HGB 13.2 13.7 13.6 13.3 12.7  HCT 37.7 40.6  39.5 39.4 38.0  MCV 95.2 95.5 93.6 97.3 96.2  PLT 47* 101* 145* 112* 131*   Cardiac Enzymes: No results for input(s): CKTOTAL, CKMB, CKMBINDEX, TROPONINI in the last 168 hours. BNP: BNP (last 3 results) No results for input(s): BNP in the last 8760 hours.  ProBNP (last 3 results) No results for input(s): PROBNP in the last 8760 hours.  CBG: No results for input(s): GLUCAP in the last 168 hours.   Signed:  Velvet Bathe MD.  [facility] 04/07/2018, 1:06 PM

## 2018-04-08 DIAGNOSIS — I10 Essential (primary) hypertension: Secondary | ICD-10-CM | POA: Diagnosis not present

## 2018-04-08 DIAGNOSIS — R52 Pain, unspecified: Secondary | ICD-10-CM | POA: Diagnosis not present

## 2018-04-08 DIAGNOSIS — M545 Low back pain: Secondary | ICD-10-CM | POA: Diagnosis not present

## 2018-04-08 DIAGNOSIS — R2689 Other abnormalities of gait and mobility: Secondary | ICD-10-CM | POA: Diagnosis not present

## 2018-04-08 DIAGNOSIS — S3210XA Unspecified fracture of sacrum, initial encounter for closed fracture: Secondary | ICD-10-CM | POA: Diagnosis not present

## 2018-04-08 DIAGNOSIS — Z9181 History of falling: Secondary | ICD-10-CM | POA: Diagnosis not present

## 2018-04-08 DIAGNOSIS — Z8619 Personal history of other infectious and parasitic diseases: Secondary | ICD-10-CM | POA: Diagnosis not present

## 2018-04-08 DIAGNOSIS — N390 Urinary tract infection, site not specified: Secondary | ICD-10-CM | POA: Diagnosis not present

## 2018-04-12 DIAGNOSIS — R52 Pain, unspecified: Secondary | ICD-10-CM | POA: Diagnosis not present

## 2018-04-12 DIAGNOSIS — S3210XA Unspecified fracture of sacrum, initial encounter for closed fracture: Secondary | ICD-10-CM | POA: Diagnosis not present

## 2018-04-12 DIAGNOSIS — I10 Essential (primary) hypertension: Secondary | ICD-10-CM | POA: Diagnosis not present

## 2018-04-12 DIAGNOSIS — M545 Low back pain: Secondary | ICD-10-CM | POA: Diagnosis not present

## 2018-04-12 DIAGNOSIS — R2689 Other abnormalities of gait and mobility: Secondary | ICD-10-CM | POA: Diagnosis not present

## 2018-04-12 DIAGNOSIS — Z7409 Other reduced mobility: Secondary | ICD-10-CM | POA: Diagnosis not present

## 2018-04-12 DIAGNOSIS — Z9181 History of falling: Secondary | ICD-10-CM | POA: Diagnosis not present

## 2018-04-13 ENCOUNTER — Other Ambulatory Visit: Payer: MEDICARE

## 2018-04-14 DIAGNOSIS — R52 Pain, unspecified: Secondary | ICD-10-CM | POA: Diagnosis not present

## 2018-04-14 DIAGNOSIS — Z9181 History of falling: Secondary | ICD-10-CM | POA: Diagnosis not present

## 2018-04-14 DIAGNOSIS — M545 Low back pain: Secondary | ICD-10-CM | POA: Diagnosis not present

## 2018-04-14 DIAGNOSIS — R2689 Other abnormalities of gait and mobility: Secondary | ICD-10-CM | POA: Diagnosis not present

## 2018-04-18 DIAGNOSIS — R52 Pain, unspecified: Secondary | ICD-10-CM | POA: Diagnosis not present

## 2018-04-18 DIAGNOSIS — R2689 Other abnormalities of gait and mobility: Secondary | ICD-10-CM | POA: Diagnosis not present

## 2018-04-18 DIAGNOSIS — Z9181 History of falling: Secondary | ICD-10-CM | POA: Diagnosis not present

## 2018-04-18 DIAGNOSIS — M545 Low back pain: Secondary | ICD-10-CM | POA: Diagnosis not present

## 2018-04-20 DIAGNOSIS — M545 Low back pain: Secondary | ICD-10-CM | POA: Diagnosis not present

## 2018-04-20 DIAGNOSIS — Z9181 History of falling: Secondary | ICD-10-CM | POA: Diagnosis not present

## 2018-04-20 DIAGNOSIS — R2689 Other abnormalities of gait and mobility: Secondary | ICD-10-CM | POA: Diagnosis not present

## 2018-04-20 DIAGNOSIS — R52 Pain, unspecified: Secondary | ICD-10-CM | POA: Diagnosis not present

## 2018-04-22 DIAGNOSIS — M545 Low back pain: Secondary | ICD-10-CM | POA: Diagnosis not present

## 2018-04-22 DIAGNOSIS — R52 Pain, unspecified: Secondary | ICD-10-CM | POA: Diagnosis not present

## 2018-04-22 DIAGNOSIS — R2689 Other abnormalities of gait and mobility: Secondary | ICD-10-CM | POA: Diagnosis not present

## 2018-04-22 DIAGNOSIS — R454 Irritability and anger: Secondary | ICD-10-CM | POA: Diagnosis not present

## 2018-04-22 DIAGNOSIS — Z9181 History of falling: Secondary | ICD-10-CM | POA: Diagnosis not present

## 2018-04-26 DIAGNOSIS — R2689 Other abnormalities of gait and mobility: Secondary | ICD-10-CM | POA: Diagnosis not present

## 2018-04-26 DIAGNOSIS — R52 Pain, unspecified: Secondary | ICD-10-CM | POA: Diagnosis not present

## 2018-04-26 DIAGNOSIS — Z9181 History of falling: Secondary | ICD-10-CM | POA: Diagnosis not present

## 2018-04-26 DIAGNOSIS — M545 Low back pain: Secondary | ICD-10-CM | POA: Diagnosis not present

## 2018-04-28 DIAGNOSIS — M545 Low back pain: Secondary | ICD-10-CM | POA: Diagnosis not present

## 2018-04-28 DIAGNOSIS — K5900 Constipation, unspecified: Secondary | ICD-10-CM | POA: Diagnosis not present

## 2018-04-28 DIAGNOSIS — R2689 Other abnormalities of gait and mobility: Secondary | ICD-10-CM | POA: Diagnosis not present

## 2018-04-28 DIAGNOSIS — Z9181 History of falling: Secondary | ICD-10-CM | POA: Diagnosis not present

## 2018-04-28 DIAGNOSIS — R52 Pain, unspecified: Secondary | ICD-10-CM | POA: Diagnosis not present

## 2018-04-29 DIAGNOSIS — G4700 Insomnia, unspecified: Secondary | ICD-10-CM | POA: Diagnosis not present

## 2018-04-29 DIAGNOSIS — F329 Major depressive disorder, single episode, unspecified: Secondary | ICD-10-CM | POA: Diagnosis not present

## 2018-05-02 DIAGNOSIS — Z9181 History of falling: Secondary | ICD-10-CM | POA: Diagnosis not present

## 2018-05-02 DIAGNOSIS — R2689 Other abnormalities of gait and mobility: Secondary | ICD-10-CM | POA: Diagnosis not present

## 2018-05-02 DIAGNOSIS — K5900 Constipation, unspecified: Secondary | ICD-10-CM | POA: Diagnosis not present

## 2018-05-02 DIAGNOSIS — M545 Low back pain: Secondary | ICD-10-CM | POA: Diagnosis not present

## 2018-05-02 DIAGNOSIS — R52 Pain, unspecified: Secondary | ICD-10-CM | POA: Diagnosis not present

## 2018-05-03 ENCOUNTER — Telehealth: Payer: Self-pay | Admitting: Family Medicine

## 2018-05-03 ENCOUNTER — Ambulatory Visit (INDEPENDENT_AMBULATORY_CARE_PROVIDER_SITE_OTHER): Payer: MEDICARE | Admitting: Family Medicine

## 2018-05-03 ENCOUNTER — Encounter: Payer: Self-pay | Admitting: Family Medicine

## 2018-05-03 VITALS — BP 130/78 | HR 81 | Temp 97.6°F | Ht 60.5 in | Wt 111.5 lb

## 2018-05-03 DIAGNOSIS — N390 Urinary tract infection, site not specified: Secondary | ICD-10-CM

## 2018-05-03 DIAGNOSIS — R410 Disorientation, unspecified: Secondary | ICD-10-CM | POA: Diagnosis not present

## 2018-05-03 DIAGNOSIS — Z9181 History of falling: Secondary | ICD-10-CM | POA: Diagnosis not present

## 2018-05-03 DIAGNOSIS — S3210XA Unspecified fracture of sacrum, initial encounter for closed fracture: Secondary | ICD-10-CM | POA: Diagnosis not present

## 2018-05-03 DIAGNOSIS — I10 Essential (primary) hypertension: Secondary | ICD-10-CM | POA: Diagnosis not present

## 2018-05-03 DIAGNOSIS — S0990XA Unspecified injury of head, initial encounter: Secondary | ICD-10-CM | POA: Diagnosis not present

## 2018-05-03 NOTE — Assessment & Plan Note (Signed)
bp in fair control at this time  BP Readings from Last 1 Encounters:  05/03/18 130/78   No changes needed Most recent labs reviewed  Disc lifstyle change with low sodium diet and exercise  Down from the hospital

## 2018-05-03 NOTE — Patient Instructions (Addendum)
Watch closely for nausea/headache/dizziness after a head injury   You can wash hair gently  Wash wound with soap and water and cover lightly  Use cold compress on and off tomorrow as needed   Please push fluid intake Goal is 64 oz of fluids per day- mostly water   I'm hoping confusion and sleep problems lessen at home once you get into a routine   I do want to check a urine for culture   Try to give a sample on the way out- if you cannot- bring one tomorrow   Keep me posted   Use walker for ambulation for safety

## 2018-05-03 NOTE — Progress Notes (Signed)
Subjective:    Patient ID: Michelle Gregory, female    DOB: 1923-01-03, 84 y.o.   MRN: [id number]  HPI  Here for f/u of uti and fall/head injury today   More confused lately   Finished last abx on 7/1 (cipro)  Incontinence  Confused on and off  Falling -fell this am , was getting out of bed and she slid off the bed /hit her R temple and broke her glasses  Hurt a bit at first / no headache or any symptoms at now  Problems sleeping (but she was near nurses station)   Cannot give a urine specimen today so far   Night times and ams are worse for confusion Fairly sharp today/ right now   No urinary pain  No change in urination / is incontinent  Urine on pad is a bit darker   Continues probiotic and cranberry pill   Was hosp 6/1 to 6/6 for sacral fx from a fall and metabolic encephalopathy secondary to uti  Ct of head was unremarkable  tx with recephin for 3 d then outpt 4 d to continue 7 d tx with omnicef  Also dehydrated-tx with IVF  Was d/c to [facility] SNF Discharged home today  Has their CNA coming every day to every other day   Her sacral fracture pain is much better  Is back to her pre admit pain med (was inc temporarily) - going back to regular schedule   Last labs    Chemistry      Component Value Date/Time   NA 142 04/06/2018 0529   K 3.8 04/06/2018 0529   CL 105 04/06/2018 0529   CO2 29 04/06/2018 0529   BUN 18 04/06/2018 0529   CREATININE 0.62 04/06/2018 0529      Component Value Date/Time   CALCIUM 8.6 (L) 04/06/2018 0529   ALKPHOS 79 04/06/2018 0529   AST 18 04/06/2018 0529   ALT 13 (L) 04/06/2018 0529   BILITOT 0.6 04/06/2018 0529      Lab Results  Component Value Date   WBC 6.6 04/06/2018   HGB 12.7 04/06/2018   HCT 38.0 04/06/2018   MCV 96.2 04/06/2018   PLT 131 (L) 04/06/2018   Glucose 95  Urine cx showed multiple species   CT scan CT HEAD WO CONTRAST (Accession [id number]) (Order [id number])  Imaging  Date: 04/04/2018 Department:  [facility] EAST ORTHOPEDICS Released By/Authorizing: Kayleen Memos, DO (auto-released)  Exam Information   Status Exam Begun  Exam Ended   Final [99] 04/04/2018 10:06 AM 04/04/2018 10:21 AM  PACS Images   Show images for CT HEAD WO CONTRAST  Study Result   CLINICAL DATA:  84 year old female with confusion, recent fall. Increasing back pain.  EXAM: CT HEAD WITHOUT CONTRAST  TECHNIQUE: Contiguous axial images were obtained from the base of the skull through the vertex without intravenous contrast.  COMPARISON:  Head CT without contrast 12/28/2008.  FINDINGS: Brain: Mild generalized cerebral volume loss since 2010. Mild for age patchy and scattered bilateral cerebral white matter hypodensity. No midline shift, ventriculomegaly, mass effect, evidence of mass lesion, intracranial hemorrhage or evidence of cortically based acute infarction. No cortical encephalomalacia identified.  Vascular: Calcified atherosclerosis at the skull base. No suspicious intracranial vascular hyperdensity.  Skull: No acute osseous abnormality identified. Osteopenia and hyperostosis frontalis.  Sinuses/Orbits: Visualized paranasal sinuses and mastoids are stable and well pneumatized.  Other: Stable orbits soft tissues. Scalp soft tissues are stable and within normal limits.  IMPRESSION:  1. No acute intracranial abnormality. No acute traumatic injury identified. 2. Mild for age white matter changes.   Electronically Signed   By: Genevie Ann M.D.   On: 04/04/2018 10:27   BP Readings from Last 3 Encounters:  05/03/18 130/78  04/07/18 (!) 170/90  03/31/18 (!) 184/89   Pulse Readings from Last 3 Encounters:  05/03/18 81  04/07/18 86  03/31/18 (!) 103   Patient Active Problem List   Diagnosis Date Noted  . H/O falling 05/03/2018  . Head injury, acute 05/03/2018  . Sacral fracture ([place]) 04/03/2018  . Fall at home 03/29/2018  . Anxiety disorder 03/29/2018  . Fatigue 03/29/2018  .  Cerumen impaction 12/06/2017  . H/O Clostridium difficile infection 10/13/2017  . Recurrent UTI (urinary tract infection) 09/06/2017  . Auditory hallucinations 03/03/2017  . Weight loss 03/03/2017  . Female cystocele 04/13/2016  . Bowel habit changes 12/30/2015  . Compression fracture 12/30/2015  . Routine general medical examination at a health care facility 04/09/2015  . Candidal intertrigo 03/28/2014  . Encounter for Medicare annual wellness exam 03/21/2013  . Gout 09/19/2012  . Osteopenia 05/05/2011  . Degenerative lumbar disc 01/20/2011  . VARICOSE VEINS, LOWER EXTREMITIES 09/04/2010  . Hyperglycemia 01/08/2010  . Hyperlipidemia 05/09/2008  . Essential hypertension 05/09/2008  . HEMORRHOIDS, INTERNAL 05/09/2008  . Allergic rhinitis 05/09/2008  . Mild reactive airways disease 05/09/2008  . GERD 05/09/2008  . IBS 05/09/2008  . Osteoarthritis 05/09/2008  . History of malignant neoplasm of large intestine 05/09/2008   Past Medical History:  Diagnosis Date  . Allergic rhinitis   . Anxiety   . C. difficile diarrhea 10/2017  . Cancer ([place])   . Depression   . GERD (gastroesophageal reflux disease)   . History of colon cancer   . History of recurrent UTIs   . Hyperlipidemia   . Hypertension   . Osteoarthritis   . Spinal compression fracture ([place])   . Vertigo    Past Surgical History:  Procedure Laterality Date  . APPENDECTOMY    . CATARACT EXTRACTION    . COLON RESECTION    . TONSILLECTOMY     Social History   Tobacco Use  . Smoking status: Never Smoker  . Smokeless tobacco: Never Used  Substance Use Topics  . Alcohol use: No    Alcohol/week: 0.0 oz  . Drug use: No   Family History  Problem Relation Age of Onset  . Prostate cancer Father   . Heart attack Mother   . Gout Mother    Allergies  Allergen Reactions  . Amoxicillin-Pot Clavulanate Diarrhea    Has patient had a PCN reaction causing immediate rash, facial/tongue/throat swelling, SOB or  lightheadedness with hypotension: Yes Has patient had a PCN reaction causing severe rash involving mucus membranes or skin necrosis: No Has patient had a PCN reaction that required hospitalization: No Has patient had a PCN reaction occurring within the last 10 years: No If all of the above answers are "NO", then may proceed with Cephalosporin use.   Marland Kitchen Keflex [Cephalexin] Diarrhea  . Septra [Sulfamethoxazole-Trimethoprim] Nausea Only   Current Outpatient Medications on File Prior to Visit  Medication Sig Dispense Refill  . allopurinol (ZYLOPRIM) 100 MG tablet TAKE 2 TABLETS BY MOUTH  DAILY 180 tablet 3  . ALPRAZolam (XANAX) 0.5 MG tablet 1/2 pill by mouth up to twice daily as needed for anxiety 10 tablet 0  . Ascorbic Acid (VITAMIN C) 100 MG tablet Take 100 mg by mouth daily.    Marland Kitchen  aspirin 81 MG tablet Take 81 mg by mouth daily.      . calcium-vitamin D (CALCIUM 500 +D) 500 MG tablet Take 1 tablet by mouth daily.     Marland Kitchen CRANBERRY PO Take 1 capsule by mouth 2 (two) times daily.    Marland Kitchen gabapentin (NEURONTIN) 300 MG capsule Take 300 mg by mouth 2 (two) times daily.   6  . glucosamine-chondroitin 500-400 MG tablet Take 1 tablet by mouth daily.    Marland Kitchen HYDROcodone-acetaminophen (NORCO/VICODIN) 5-325 MG tablet Take 1 tablet by mouth every 4 (four) hours as needed for moderate pain. 20 tablet 0  . losartan (COZAAR) 100 MG tablet TAKE 1 TABLET BY MOUTH  DAILY 90 tablet 3  . meclizine (ANTIVERT) 25 MG tablet TAKE 1 TABLET (25 MG TOTAL) BY MOUTH 3 (THREE) TIMES  DAILY AS NEEDED. 270 tablet 0  . metoprolol tartrate (LOPRESSOR) 50 MG tablet Take 1 tablet (50 mg total) by mouth 2 (two) times daily. 180 tablet 3  . multivitamin (THERAGRAN) per tablet Take 1 tablet by mouth daily.      Marland Kitchen PARoxetine (PAXIL) 30 MG tablet TAKE 1 TABLET BY MOUTH  EVERY MORNING 90 tablet 3  . Probiotic Product (PROBIOTIC DAILY PO) Take 1 tablet by mouth daily.    . raloxifene (EVISTA) 60 MG tablet TAKE 1 TABLET BY MOUTH DAILY. 90  tablet 3  . [DISCONTINUED] Glucosamine-Chondroitin 1500-1200 MG/30ML LIQD Take by mouth. As dierected      No current facility-administered medications on file prior to visit.       Review of Systems  Constitutional: Negative for activity change, appetite change, fatigue, fever and unexpected weight change.  HENT: Negative for congestion, ear pain, rhinorrhea, sinus pressure and sore throat.   Eyes: Negative for pain, redness and visual disturbance.  Respiratory: Negative for cough, shortness of breath and wheezing.   Cardiovascular: Negative for chest pain and palpitations.  Gastrointestinal: Negative for abdominal pain, blood in stool, constipation and diarrhea.  Endocrine: Negative for polydipsia and polyuria.  Genitourinary: Negative for dysuria, frequency and urgency.  Musculoskeletal: Negative for arthralgias, back pain and myalgias.  Skin: Negative for pallor and rash.  Allergic/Immunologic: Negative for environmental allergies.  Neurological: Negative for dizziness, tremors, seizures, syncope, facial asymmetry, speech difficulty, weakness, light-headedness, numbness and headaches.       Unsteadiness  Hematological: Negative for adenopathy. Does not bruise/bleed easily.  Psychiatric/Behavioral: Positive for confusion and sleep disturbance. Negative for decreased concentration and dysphoric mood. The patient is not nervous/anxious.        Confusion while in [place]-at night       Objective:   Physical Exam  Constitutional: She appears well-developed and well-nourished. No distress.  Well appearing elderly female with wound on R temple  HENT:  Head: Normocephalic.  Right Ear: External ear normal.  Left Ear: External ear normal.  Nose: Nose normal.  Mouth/Throat: Oropharynx is clear and moist.  2 skin tears with scant bruising around R temple  No oozing or tenderness   Eyes: Pupils are equal, round, and reactive to light. Conjunctivae and EOM are normal. Right eye  exhibits no discharge. Left eye exhibits no discharge. No scleral icterus.  Neck: Normal range of motion. Neck supple. No JVD present. Carotid bruit is not present. No thyromegaly present.  Cardiovascular: Normal rate, regular rhythm, normal heart sounds and intact distal pulses. Exam reveals no gallop.  Pulmonary/Chest: Effort normal and breath sounds normal. No respiratory distress. She has no wheezes. She has no rales.  No crackles  Abdominal: Soft. Bowel sounds are normal. She exhibits no distension, no abdominal bruit and no mass. There is no tenderness.  No suprapubic tenderness or fullness   No cva tenderness   Musculoskeletal: She exhibits no edema or deformity.  Pt can sit comfortably with sacral fracture Walks with assistance  Lymphadenopathy:    She has no cervical adenopathy.  Neurological: She is alert. She has normal reflexes. She displays normal reflexes. No cranial nerve deficit. She exhibits normal muscle tone. Coordination normal.  Walks with assistance/generally poor balance    Skin: Skin is warm and dry. No rash noted. No pallor.  Psychiatric: She has a normal mood and affect.  Mentally sharp today- answers questions appropriately  Not confused while she was here          Assessment & Plan:   Problem List Items Addressed This Visit      Cardiovascular and Mediastinum   Essential hypertension    bp in fair control at this time  BP Readings from Last 1 Encounters:  05/03/18 130/78   No changes needed Most recent labs reviewed  Disc lifstyle change with low sodium diet and exercise  Down from the hospital        Musculoskeletal and Integument   Sacral fracture [facility])    Reviewed hospital records, lab results and studies in detail  Doing well/ back to original pain px  Able to sit comfortably and walk with asst D/c from rehab today- going home  Disc fall prev in detail Has bed rail and walker Also PT and OT  Re check ucx today - MS is improved           Genitourinary   Recurrent UTI (urinary tract infection)    Recent hosp for uti with metabolic encephalopathy  Mult sp on ucx  tx with rocephin/omnicef/ then cipro on nsg home  Doing much better Re check ucx today  Continue good hydration and watch for MS change      Relevant Orders   Urine Culture     Other   Episodic confusion - Primary    Prev rel to uti Reviewed hospital records, lab results and studies in detail   Now suspect sundowning  This may imp at home on a schedule  Watch closely  ucx sent as well        H/O falling    Sacral fx (Reviewed hospital records, lab results and studies in detail )  Again today at SNF- slid from bed  Mild head injury and nl exam today  Long disc re: fall prev  Has walker and bed rail PT /OT  24 h care from family        Head injury, acute    2 skin tears from bump on bed rail today  Reassuring exam  rec wash with soap and water/light cover  Watch for s/s of infection  Also s/s of  Concussion /subdural (rev in detail) Family to care for at home/ monitor closely

## 2018-05-03 NOTE — Assessment & Plan Note (Signed)
Recent hosp for uti with metabolic encephalopathy  Mult sp on ucx  tx with rocephin/omnicef/ then cipro on nsg home  Doing much better Re check ucx today  Continue good hydration and watch for MS change

## 2018-05-03 NOTE — Telephone Encounter (Signed)
Copied from [place] [id number]. Topic: Quick Communication - See Telephone Encounter >> May 03, 2018  8:22 AM Ahmed Prima L wrote: CRM for notification. See Telephone encounter for: 05/03/18.  Patient's daughter said she will be bringing her home from rehab today. She said that she just got over an UTI and just finished her last dosage of Cipro yesterday. Patient's daughter would like to know does she need to come back in and be re-checked because she seems to be still a little confused especially at night. She would like Dr Marliss Coots nurse to call her please.   [name] [phone] )

## 2018-05-03 NOTE — Telephone Encounter (Signed)
I spoke with [name]; pt wants to know what Dr Glori Bickers thinks about pt needing to have another urine cked; [name] did not want to schedule appt until Dr Glori Bickers reviewed the note. Pt finished Cipro 05/02/18; pt has some confusion and incontinent of urine on and off; pt does not have time to get to the bathroom. Pt has no complaints of pain or burning when urinates and no odor, no fever and no frequency. [name] request cb.

## 2018-05-03 NOTE — Assessment & Plan Note (Signed)
Sacral fx (Reviewed hospital records, lab results and studies in detail )  Again today at SNF- slid from bed  Mild head injury and nl exam today  Long disc re: fall prev  Has walker and bed rail PT /OT  24 h care from family

## 2018-05-03 NOTE — Assessment & Plan Note (Signed)
2 skin tears from bump on bed rail today  Reassuring exam  rec wash with soap and water/light cover  Watch for s/s of infection  Also s/s of  Concussion /subdural (rev in detail) Family to care for at home/ monitor closely

## 2018-05-03 NOTE — Telephone Encounter (Signed)
Please have her drop off urine sample for cx only - diag uti  Thanks

## 2018-05-03 NOTE — Assessment & Plan Note (Signed)
Prev rel to uti Reviewed hospital records, lab results and studies in detail   Now suspect sundowning  This may imp at home on a schedule  Watch closely  ucx sent as well

## 2018-05-03 NOTE — Assessment & Plan Note (Signed)
Reviewed hospital records, lab results and studies in detail  Doing well/ back to original pain px  Able to sit comfortably and walk with asst D/c from rehab today- going home  Disc fall prev in detail Has bed rail and walker Also PT and OT  Re check ucx today - MS is improved

## 2018-05-03 NOTE — Telephone Encounter (Signed)
appt scheduled for today due to pt falling as well. Dr. Glori Bickers aware

## 2018-05-04 LAB — URINE CULTURE
MICRO NUMBER:: 90787456
SPECIMEN QUALITY:: ADEQUATE

## 2018-05-11 ENCOUNTER — Ambulatory Visit: Payer: MEDICARE | Admitting: Family Medicine

## 2018-05-16 ENCOUNTER — Ambulatory Visit: Payer: MEDICARE | Admitting: Podiatry

## 2018-05-30 DIAGNOSIS — N814 Uterovaginal prolapse, unspecified: Secondary | ICD-10-CM | POA: Diagnosis not present

## 2018-05-30 DIAGNOSIS — N812 Incomplete uterovaginal prolapse: Secondary | ICD-10-CM | POA: Diagnosis not present

## 2018-06-06 ENCOUNTER — Ambulatory Visit (INDEPENDENT_AMBULATORY_CARE_PROVIDER_SITE_OTHER): Payer: MEDICARE | Admitting: Podiatry

## 2018-06-06 DIAGNOSIS — M79676 Pain in unspecified toe(s): Secondary | ICD-10-CM | POA: Diagnosis not present

## 2018-06-06 DIAGNOSIS — B351 Tinea unguium: Secondary | ICD-10-CM

## 2018-06-08 NOTE — Progress Notes (Signed)
   SUBJECTIVE Patient presents to office today complaining of elongated, thickened nails that cause pain while ambulating in shoes. She is unable to trim her own nails. Patient is here for further evaluation and treatment.  Past Medical History:  Diagnosis Date  . Allergic rhinitis   . Anxiety   . C. difficile diarrhea 10/2017  . Cancer ([place])   . Depression   . GERD (gastroesophageal reflux disease)   . History of colon cancer   . History of recurrent UTIs   . Hyperlipidemia   . Hypertension   . Osteoarthritis   . Spinal compression fracture ([place])   . Vertigo     OBJECTIVE General Patient is awake, alert, and oriented x 3 and in no acute distress. Derm Skin is dry and supple bilateral. Negative open lesions or macerations. Remaining integument unremarkable. Nails are tender, long, thickened and dystrophic with subungual debris, consistent with onychomycosis, 1-5 bilateral. No signs of infection noted. Vasc  DP and PT pedal pulses palpable bilaterally. Temperature gradient within normal limits.  Neuro Epicritic and protective threshold sensation grossly intact bilaterally.  Musculoskeletal Exam No symptomatic pedal deformities noted bilateral. Muscular strength within normal limits.  ASSESSMENT 1. Onychodystrophic nails 1-5 bilateral with hyperkeratosis of nails.  2. Onychomycosis of nail due to dermatophyte bilateral 3. Pain in foot bilateral  PLAN OF CARE 1. Patient evaluated today.  2. Instructed to maintain good pedal hygiene and foot care.  3. Mechanical debridement of nails 1-5 bilaterally performed using a nail nipper. Filed with dremel without incident.  4. Return to clinic in 3 mos.    Edrick Kins, DPM [facility]  Dr. Edrick Kins, [address]                                        [place], [place] [address]                Office [phone]  Fax [phone]

## 2018-06-10 ENCOUNTER — Other Ambulatory Visit: Payer: Self-pay | Admitting: Family Medicine

## 2018-07-14 ENCOUNTER — Telehealth: Payer: Self-pay | Admitting: *Deleted

## 2018-07-14 NOTE — Telephone Encounter (Signed)
She usually responds to low dose cipro (250 mg)  This is generally not first line but has worked for her when others did not  A challenge because we often do not get a valid culture result

## 2018-07-14 NOTE — Telephone Encounter (Signed)
Copied from [place] [id number]. Topic: Quick Communication - See Telephone Encounter >> Jul 14, 2018 10:43 AM Marja Kays F wrote: Pt daughter is calling to let Dr. Glori Bickers know that patient is over due for a uti and they are going to the beach for a week and would like to know the name of the medication That she usually prescribes just as a precaution since they will be at the beach for a week   Best number is [phone]

## 2018-07-14 NOTE — Telephone Encounter (Signed)
Daughter notified of Dr. Marliss Coots comments she will keep name of med incase they have to take her to an UC while they are out of town

## 2018-07-22 ENCOUNTER — Telehealth: Payer: Self-pay

## 2018-07-22 ENCOUNTER — Other Ambulatory Visit (INDEPENDENT_AMBULATORY_CARE_PROVIDER_SITE_OTHER): Payer: MEDICARE

## 2018-07-22 DIAGNOSIS — R8290 Unspecified abnormal findings in urine: Secondary | ICD-10-CM | POA: Diagnosis not present

## 2018-07-22 DIAGNOSIS — R4182 Altered mental status, unspecified: Secondary | ICD-10-CM

## 2018-07-22 LAB — POC URINALSYSI DIPSTICK (AUTOMATED)
Bilirubin, UA: NEGATIVE
Blood, UA: NEGATIVE
Glucose, UA: NEGATIVE
Ketones, UA: NEGATIVE
Nitrite, UA: NEGATIVE
Protein, UA: NEGATIVE
Spec Grav, UA: 1.025 (ref 1.010–1.025)
Urobilinogen, UA: 0.2 E.U./dL
pH, UA: 6 (ref 5.0–8.0)

## 2018-07-22 NOTE — Telephone Encounter (Signed)
[name] called stating pt symptoms include confusion, being combative, she is unsteady on her feet, and very strong odor in urine. She has sample ready to bring in. Please advise if she can bring sample or if appt is needed. Call on [name]'s cell ph # [phone].  [place], [place] [phone] (Phone) [phone] (Fax)

## 2018-07-22 NOTE — Telephone Encounter (Signed)
Copied from [place] [id number]. Topic: General - Other >> Jul 21, 2018  6:25 PM Ivar Drape wrote: Reason for CRM:  Patient's daughter, [name], thinks the patient has a UTI and is bringing a urine sample in the morning.  She said Dr. Glori Bickers has let her do this before.  If this is not ok, she would like a call back [phone].

## 2018-07-22 NOTE — Telephone Encounter (Signed)
This is skype from [facility]; [07/22/2018 9:18 AM]  [name]:   I have a pt dtr returning call to [name], MRN [id number], Michelle Gregory She is wanting to bring in urine sample. Pt symptoms were requested. She is having confusion, unsteady on her feet, and very strong odor in urine. Please advise if she can bring sample or if appt is needed.   [07/22/2018 9:20 AM]  Ozzie Hoyle:   will send note to Dr Glori Bickers and pts daughter will be contacted. find out what pharmacy they use locally and good contact # and addend encounter in pts chart. thank you.

## 2018-07-22 NOTE — Telephone Encounter (Signed)
Daughter will drop off a urine sample and advise of Dr. Marliss Coots comments

## 2018-07-22 NOTE — Telephone Encounter (Signed)
What symptoms is she having?

## 2018-07-22 NOTE — Telephone Encounter (Signed)
Please have then bring a sample Encourage fluids Alert me if symptoms worsen

## 2018-07-22 NOTE — Telephone Encounter (Signed)
Pt's daughter was unavailable when I called, spouse requested that I call back in 15 min

## 2018-07-23 LAB — URINE CULTURE
MICRO NUMBER:: 91132457
SPECIMEN QUALITY:: ADEQUATE

## 2018-07-25 ENCOUNTER — Ambulatory Visit: Payer: Self-pay

## 2018-07-25 NOTE — Telephone Encounter (Signed)
Pt daughter called for urine culture results.  When she heard that everything was normal she expressed concern about her mothers confusion. The confusion comes and goes and at times she ask " where is Michelle Gregory" daughter is Michelle Gregory.  Daughter denies weakness, slurred speech. Appointment made per protocol. Care advice given to daughter Michelle Gregory. Michelle Gregory verbalized understanding of all.  Reason for Disposition . [1] Longstanding confusion (e.g., dementia, stroke) AND [2] NO worsening or change    Confusion with UTI which has ruled out  Answer Assessment - Initial Assessment Questions 1. LEVEL OF CONSCIOUSNESS: "How is he (she, the patient) acting right now?" (e.g., alert-oriented, confused, lethargic, stuporous, comatose)     yes 2. ONSET: "When did the confusion start?"  (minutes, hours, days)     Last week at the beach 3. PATTERN "Does this come and go, or has it been constant since it started?"  "Is it present now?"     Comes and goes 4. ALCOHOL or DRUGS: "Has he been drinking alcohol or taking any drugs?"      Hydrocodone xanax as needed 5. NARCOTIC MEDICATIONS: "Has he been receiving any narcotic medications?" (e.g., morphine, Vicodin)     hydrocodone 6. CAUSE: "What do you think is causing the confusion?"      unsure 7. OTHER SYMPTOMS: "Are there any other symptoms?" (e.g., difficulty breathing, headache, fever, weakness)     no  Protocols used: CONFUSION - DELIRIUM-A-AH

## 2018-07-26 ENCOUNTER — Ambulatory Visit: Payer: MEDICARE | Admitting: Family Medicine

## 2018-07-26 NOTE — Telephone Encounter (Signed)
That appt is blocked today as I have to leave early - will work on changing appt time

## 2018-07-26 NOTE — Telephone Encounter (Signed)
I spoke with [name](DPR signed) and she scheduled 30' appt 07/26/18 at 4:15 with Dr Glori Bickers. [name] is not sure if confusion is coming from changing her surroundings at the beach or if something else is going on. No slurred speech or weakness noted. FYI to Dr Glori Bickers.

## 2018-07-27 ENCOUNTER — Ambulatory Visit: Payer: MEDICARE | Admitting: Family Medicine

## 2018-07-27 ENCOUNTER — Ambulatory Visit (INDEPENDENT_AMBULATORY_CARE_PROVIDER_SITE_OTHER): Payer: MEDICARE | Admitting: Family Medicine

## 2018-07-27 ENCOUNTER — Encounter: Payer: Self-pay | Admitting: Family Medicine

## 2018-07-27 VITALS — BP 112/70 | HR 93 | Temp 98.0°F | Ht 60.5 in | Wt 116.5 lb

## 2018-07-27 DIAGNOSIS — I10 Essential (primary) hypertension: Secondary | ICD-10-CM

## 2018-07-27 DIAGNOSIS — R634 Abnormal weight loss: Secondary | ICD-10-CM | POA: Diagnosis not present

## 2018-07-27 DIAGNOSIS — Z23 Encounter for immunization: Secondary | ICD-10-CM

## 2018-07-27 DIAGNOSIS — H6121 Impacted cerumen, right ear: Secondary | ICD-10-CM

## 2018-07-27 DIAGNOSIS — Z9181 History of falling: Secondary | ICD-10-CM | POA: Diagnosis not present

## 2018-07-27 DIAGNOSIS — R410 Disorientation, unspecified: Secondary | ICD-10-CM | POA: Diagnosis not present

## 2018-07-27 NOTE — Patient Instructions (Addendum)
I think the intermittent confusion is from age related brain changes (dementia) that worsen with change in environment/schedule or sleep   This time-there was no uti -that is re assuring It is also re assuring that Michelle Gregory is slowly improving when getting back into her daily routine  Encourage regular meals Good fluid intake  Normal sleep and wake times Also encourage social interaction at appropriate times   Outdoor time and exposure to light during the day helps also  Avoid alcohol and sedating medicines   Hearing problems can also worsen confusion Right ear had a wax impaction today   Vital signs are re assuring Weight is up 5 lb which is good (keep the nutrition coming)   Reduce fall risk whenever possible- this is also more common with change in routine

## 2018-07-27 NOTE — Assessment & Plan Note (Signed)
One fall in bedroom on recent beach trip - with episodes of confusion/sundowning  Urged family to use great caution with change in environment/ schedule  Fall precautions discussed

## 2018-07-27 NOTE — Assessment & Plan Note (Signed)
bp in fair control at this time  BP Readings from Last 1 Encounters:  07/27/18 112/70   No changes needed Most recent labs reviewed  Disc lifstyle change with low sodium diet and exercise

## 2018-07-27 NOTE — Assessment & Plan Note (Signed)
Wt is up 5 lb with better po intake now

## 2018-07-27 NOTE — Assessment & Plan Note (Addendum)
Suspect this episode was caused by dementia (age related) in the setting of travel and change in routine/sleep No uti this time (u cx neg)  Enc regular schedule/ meals/fluids/sleep habits  Expect some degree of sundowning -worse with fatigue  Will continue to follow  No agitation  R ear irrigated-imp in hearing may reduce problems as well  Handout given  No plan to start medication at this time

## 2018-07-27 NOTE — Progress Notes (Signed)
Subjective:    Patient ID: Michelle Gregory, female    DOB: May 08, 1923, 84 y.o.   MRN: [id number]  HPI  Here for more frequent confusion   This was worse after travel (beach trip)  Pt states she had a good trip  (could sit on the balcony and see the beach) Tired when she got back   Per daughter - had one fall when there Just was not acting right in general  Some slow improvement since she came home   [place] when she turned around too fast  Hit back/side-no injury Did not hit her head    Also not hearing as well - ? If ears are clogged    Needs flu shot -will have today   Had urine checked -neg cx on 9/20   Wt Readings from Last 3 Encounters:  07/27/18 116 lb 8 oz (52.8 kg)  05/03/18 111 lb 8 oz (50.6 kg)  04/02/18 118 lb (53.5 kg)   22.38 kg/m     BP Readings from Last 3 Encounters:  07/27/18 112/70  05/03/18 130/78  04/07/18 (!) 170/90   Pulse Readings from Last 3 Encounters:  07/27/18 93  05/03/18 81  04/07/18 86   Vitals are stable Pulse ox 96% on RA today   Anxiety disorder  Takes paxil 30 mg  Had a px for 10 xanax from another provider   Auditory hallucinations in the past  None of those   Patient Active Problem List   Diagnosis Date Noted  . H/O falling 05/03/2018  . Head injury, acute 05/03/2018  . Episodic confusion 05/03/2018  . Sacral fracture ([place]) 04/03/2018  . Fall at home 03/29/2018  . Anxiety disorder 03/29/2018  . Fatigue 03/29/2018  . Cerumen impaction 12/06/2017  . H/O Clostridium difficile infection 10/13/2017  . Recurrent UTI (urinary tract infection) 09/06/2017  . Auditory hallucinations 03/03/2017  . Weight loss 03/03/2017  . Female cystocele 04/13/2016  . Bowel habit changes 12/30/2015  . Compression fracture 12/30/2015  . Routine general medical examination at a health care facility 04/09/2015  . Candidal intertrigo 03/28/2014  . Encounter for Medicare annual wellness exam 03/21/2013  . Gout 09/19/2012  . Osteopenia  05/05/2011  . Degenerative lumbar disc 01/20/2011  . VARICOSE VEINS, LOWER EXTREMITIES 09/04/2010  . Hyperglycemia 01/08/2010  . Hyperlipidemia 05/09/2008  . Essential hypertension 05/09/2008  . HEMORRHOIDS, INTERNAL 05/09/2008  . Allergic rhinitis 05/09/2008  . Mild reactive airways disease 05/09/2008  . GERD 05/09/2008  . IBS 05/09/2008  . Osteoarthritis 05/09/2008  . History of malignant neoplasm of large intestine 05/09/2008   Past Medical History:  Diagnosis Date  . Allergic rhinitis   . Anxiety   . C. difficile diarrhea 10/2017  . Cancer ([place])   . Depression   . GERD (gastroesophageal reflux disease)   . History of colon cancer   . History of recurrent UTIs   . Hyperlipidemia   . Hypertension   . Osteoarthritis   . Spinal compression fracture ([place])   . Vertigo    Past Surgical History:  Procedure Laterality Date  . APPENDECTOMY    . CATARACT EXTRACTION    . COLON RESECTION    . TONSILLECTOMY     Social History   Tobacco Use  . Smoking status: Never Smoker  . Smokeless tobacco: Never Used  Substance Use Topics  . Alcohol use: No    Alcohol/week: 0.0 standard drinks  . Drug use: No   Family History  Problem Relation Age of  Onset  . Prostate cancer Father   . Heart attack Mother   . Gout Mother    Allergies  Allergen Reactions  . Amoxicillin-Pot Clavulanate Diarrhea    Has patient had a PCN reaction causing immediate rash, facial/tongue/throat swelling, SOB or lightheadedness with hypotension: Yes Has patient had a PCN reaction causing severe rash involving mucus membranes or skin necrosis: No Has patient had a PCN reaction that required hospitalization: No Has patient had a PCN reaction occurring within the last 10 years: No If all of the above answers are "NO", then may proceed with Cephalosporin use.   Marland Kitchen Keflex [Cephalexin] Diarrhea  . Septra [Sulfamethoxazole-Trimethoprim] Nausea Only   Current Outpatient Medications on File Prior to Visit    Medication Sig Dispense Refill  . allopurinol (ZYLOPRIM) 100 MG tablet TAKE 2 TABLETS BY MOUTH  DAILY 180 tablet 3  . ALPRAZolam (XANAX) 0.5 MG tablet 1/2 pill by mouth up to twice daily as needed for anxiety 10 tablet 0  . Ascorbic Acid (VITAMIN C) 100 MG tablet Take 100 mg by mouth daily.    Marland Kitchen aspirin 81 MG tablet Take 81 mg by mouth daily.      . calcium-vitamin D (CALCIUM 500 +D) 500 MG tablet Take 1 tablet by mouth daily.     Marland Kitchen CRANBERRY PO Take 1 capsule by mouth 2 (two) times daily.    Marland Kitchen gabapentin (NEURONTIN) 300 MG capsule Take 300 mg by mouth 2 (two) times daily.   6  . glucosamine-chondroitin 500-400 MG tablet Take 1 tablet by mouth daily.    Marland Kitchen HYDROcodone-acetaminophen (NORCO/VICODIN) 5-325 MG tablet Take 1 tablet by mouth every 4 (four) hours as needed for moderate pain. 20 tablet 0  . losartan (COZAAR) 100 MG tablet TAKE 1 TABLET BY MOUTH  DAILY 90 tablet 3  . meclizine (ANTIVERT) 25 MG tablet TAKE 1 TABLET (25 MG TOTAL) BY MOUTH 3 (THREE) TIMES  DAILY AS NEEDED. 270 tablet 0  . metoprolol tartrate (LOPRESSOR) 50 MG tablet Take 1 tablet (50 mg total) by mouth 2 (two) times daily. 180 tablet 3  . multivitamin (THERAGRAN) per tablet Take 1 tablet by mouth daily.      Marland Kitchen PARoxetine (PAXIL) 30 MG tablet TAKE 1 TABLET BY MOUTH  EVERY MORNING 90 tablet 3  . Probiotic Product (PROBIOTIC DAILY PO) Take 1 tablet by mouth daily.    . raloxifene (EVISTA) 60 MG tablet TAKE 1 TABLET BY MOUTH DAILY. 90 tablet 1  . [DISCONTINUED] Glucosamine-Chondroitin 1500-1200 MG/30ML LIQD Take by mouth. As dierected      No current facility-administered medications on file prior to visit.      Review of Systems  Constitutional: Positive for fatigue. Negative for activity change, appetite change, fever and unexpected weight change.       Fatigue is improving  HENT: Negative for congestion, ear pain, rhinorrhea, sinus pressure and sore throat.   Eyes: Negative for pain, redness and visual disturbance.   Respiratory: Negative for cough, shortness of breath and wheezing.   Cardiovascular: Negative for chest pain and palpitations.  Gastrointestinal: Negative for abdominal pain, blood in stool, constipation and diarrhea.  Endocrine: Negative for polydipsia and polyuria.  Genitourinary: Positive for frequency. Negative for dysuria and urgency.       Baseline frequency  Musculoskeletal: Negative for arthralgias, back pain and myalgias.  Skin: Negative for pallor and rash.  Allergic/Immunologic: Negative for environmental allergies.  Neurological: Negative for dizziness, syncope and headaches.  Hematological: Negative for adenopathy. Does not  bruise/bleed easily.  Psychiatric/Behavioral: Positive for confusion. Negative for decreased concentration and dysphoric mood. The patient is not nervous/anxious.           Objective:   Physical Exam  Constitutional: She appears well-developed and well-nourished. No distress.  Well appearing elderly female Energetic today  HENT:  Head: Normocephalic and atraumatic.  Left Ear: External ear normal.  Mouth/Throat: Oropharynx is clear and moist. No oropharyngeal exudate.  R sided cerumen impaction  Resolved with simple ear irrigation /with imp in hearing  L canal clear  Nares are boggy  Eyes: Pupils are equal, round, and reactive to light. Conjunctivae and EOM are normal. No scleral icterus.  Neck: Normal range of motion. Neck supple. No JVD present. Carotid bruit is not present. No thyromegaly present.  Cardiovascular: Normal rate, regular rhythm, normal heart sounds and intact distal pulses. Exam reveals no gallop.  Pulmonary/Chest: Effort normal and breath sounds normal. No stridor. No respiratory distress. She has no wheezes. She has no rales. She exhibits no tenderness.  No crackles  Abdominal: Soft. Bowel sounds are normal. She exhibits no distension, no abdominal bruit and no mass. There is no tenderness.  Musculoskeletal: She exhibits no  edema or tenderness.  Kyphosis  Changes of OA in extremities   Lymphadenopathy:    She has no cervical adenopathy.  Neurological: She is alert. She has normal reflexes. She displays normal reflexes. No cranial nerve deficit. She exhibits normal muscle tone. Coordination normal.  No focal neurologic deficits Needs help but able to get on exam table  Slow but steady gait today  Skin: Skin is warm and dry. No rash noted. No erythema. No pallor.  Fair sks diffusely  Psychiatric: She has a normal mood and affect. Her speech is normal and behavior is normal. Thought content normal. Her mood appears not anxious. Her affect is not blunt and not labile. Thought content is not paranoid. She does not exhibit a depressed mood. She expresses no suicidal ideation.  Fairly mentally sharp today  Talkative Not anxious today  occ repeats herself           Assessment & Plan:   Problem List Items Addressed This Visit      Cardiovascular and Mediastinum   Essential hypertension    bp in fair control at this time  BP Readings from Last 1 Encounters:  07/27/18 112/70   No changes needed Most recent labs reviewed  Disc lifstyle change with low sodium diet and exercise          Nervous and Auditory   Cerumen impaction    R side only  Cleared with simple ear irrigation-tolerated well  Improvement in hearing  Recommend home use of debrox or similar prn        Other   Episodic confusion - Primary    Suspect this episode was caused by dementia (age related) in the setting of travel and change in routine/sleep No uti this time (u cx neg)  Enc regular schedule/ meals/fluids/sleep habits  Expect some degree of sundowning -worse with fatigue  Will continue to follow  No agitation  R ear irrigated-imp in hearing may reduce problems as well  Handout given  No plan to start medication at this time       H/O falling    One fall in bedroom on recent beach trip - with episodes of  confusion/sundowning  Urged family to use great caution with change in environment/ schedule  Fall precautions discussed  RESOLVED: Weight loss    Wt is up 5 lb with better po intake now

## 2018-07-27 NOTE — Assessment & Plan Note (Signed)
R side only  Cleared with simple ear irrigation-tolerated well  Improvement in hearing  Recommend home use of debrox or similar prn

## 2018-08-31 DIAGNOSIS — M545 Low back pain: Secondary | ICD-10-CM | POA: Diagnosis not present

## 2018-09-01 ENCOUNTER — Ambulatory Visit (INDEPENDENT_AMBULATORY_CARE_PROVIDER_SITE_OTHER): Payer: MEDICARE | Admitting: Family Medicine

## 2018-09-01 ENCOUNTER — Encounter: Payer: Self-pay | Admitting: Family Medicine

## 2018-09-01 ENCOUNTER — Ambulatory Visit (INDEPENDENT_AMBULATORY_CARE_PROVIDER_SITE_OTHER)
Admission: RE | Admit: 2018-09-01 | Discharge: 2018-09-01 | Disposition: A | Discharge status: Home / Self Care | Payer: MEDICARE | Source: Ambulatory Visit | Attending: Family Medicine | Admitting: Family Medicine

## 2018-09-01 VITALS — BP 116/70 | HR 90 | Temp 97.9°F | Ht 60.5 in | Wt 116.8 lb

## 2018-09-01 DIAGNOSIS — W19XXXA Unspecified fall, initial encounter: Secondary | ICD-10-CM | POA: Diagnosis not present

## 2018-09-01 DIAGNOSIS — S6992XA Unspecified injury of left wrist, hand and finger(s), initial encounter: Secondary | ICD-10-CM

## 2018-09-01 DIAGNOSIS — N390 Urinary tract infection, site not specified: Secondary | ICD-10-CM | POA: Diagnosis not present

## 2018-09-01 DIAGNOSIS — R410 Disorientation, unspecified: Secondary | ICD-10-CM | POA: Diagnosis not present

## 2018-09-01 DIAGNOSIS — Y92009 Unspecified place in unspecified non-institutional (private) residence as the place of occurrence of the external cause: Secondary | ICD-10-CM

## 2018-09-01 LAB — POC URINALSYSI DIPSTICK (AUTOMATED)
Bilirubin, UA: NEGATIVE
Blood, UA: 50
Glucose, UA: NEGATIVE
Ketones, UA: NEGATIVE
Nitrite, UA: NEGATIVE
Protein, UA: NEGATIVE
Spec Grav, UA: 1.01 (ref 1.010–1.025)
Urobilinogen, UA: 0.2 E.U./dL
pH, UA: 7 (ref 5.0–8.0)

## 2018-09-01 MED ORDER — CIPROFLOXACIN HCL 250 MG PO TABS: 250.0000 mg | ORAL_TABLET | Freq: Two times a day (BID) | ORAL | 0 refills | Status: DC

## 2018-09-01 NOTE — Patient Instructions (Signed)
Use walker/supervise until more steady   Push water intake Take cipro as directed  If symptoms worsen-let us know   Xray of left hand and wrist- we will call you with a report  Ice or a cold compress on wrist if painful   Soap /water for knee abrasion and antibiotic ointment /loose cover (band aid)   Keep me posted   We will also contact you when urine culture return

## 2018-09-01 NOTE — Progress Notes (Signed)
Subjective:    Patient ID: Michelle Gregory, female    DOB: 1923-07-16, 84 y.o.   MRN: [id number]  HPI Here for f/u after a fall  Fell day before yesterday - coming out the door- straight down Scratched L face and knee  L hand swollen and bruised  No head injury  2 days prior- slipped out of the bed (contused chest)  Also poss uti/confusion  (occ hallucinates)   Left hand injury -no pain /just tight  Able to use it   Left knee pain -abrasion (oozed -no pain)   ua - large leuk and blood  Results for orders placed or performed in visit on 09/01/18  POCT Urinalysis Dipstick (Automated)  Result Value Ref Range   Color, UA Yellow    Clarity, UA Hazy    Glucose, UA Negative Negative   Bilirubin, UA Negative    Ketones, UA Negative    Spec Grav, UA 1.010 1.010 - 1.025   Blood, UA 50 Ery/uL    pH, UA 7.0 5.0 - 8.0   Protein, UA Negative Negative   Urobilinogen, UA 0.2 0.2 or 1.0 E.U./dL   Nitrite, UA Negative    Leukocytes, UA Large (3+) (A) Negative    last Ucx was 9/19 (neg/contaminated)  UTI  No dysuria or change in urinary spasm or odor   Lab Results  Component Value Date   CREATININE 0.62 04/06/2018   BUN 18 04/06/2018   NA 142 04/06/2018   K 3.8 04/06/2018   CL 105 04/06/2018   CO2 29 04/06/2018   Patient Active Problem List   Diagnosis Date Noted  . Hand injury, left, initial encounter 09/01/2018  . H/O falling 05/03/2018  . Episodic confusion 05/03/2018  . Sacral fracture ([place]) 04/03/2018  . Fall at home 03/29/2018  . Anxiety disorder 03/29/2018  . Fatigue 03/29/2018  . Cerumen impaction 12/06/2017  . H/O Clostridium difficile infection 10/13/2017  . Recurrent UTI (urinary tract infection) 09/06/2017  . Auditory hallucinations 03/03/2017  . Female cystocele 04/13/2016  . Bowel habit changes 12/30/2015  . Compression fracture 12/30/2015  . Routine general medical examination at a health care facility 04/09/2015  . UTI (urinary tract infection)  05/09/2014  . Candidal intertrigo 03/28/2014  . Encounter for Medicare annual wellness exam 03/21/2013  . Gout 09/19/2012  . Osteopenia 05/05/2011  . Degenerative lumbar disc 01/20/2011  . VARICOSE VEINS, LOWER EXTREMITIES 09/04/2010  . Hyperglycemia 01/08/2010  . Hyperlipidemia 05/09/2008  . Essential hypertension 05/09/2008  . HEMORRHOIDS, INTERNAL 05/09/2008  . Allergic rhinitis 05/09/2008  . Mild reactive airways disease 05/09/2008  . GERD 05/09/2008  . IBS 05/09/2008  . Osteoarthritis 05/09/2008  . History of malignant neoplasm of large intestine 05/09/2008   Past Medical History:  Diagnosis Date  . Allergic rhinitis   . Anxiety   . C. difficile diarrhea 10/2017  . Cancer ([place])   . Depression   . GERD (gastroesophageal reflux disease)   . History of colon cancer   . History of recurrent UTIs   . Hyperlipidemia   . Hypertension   . Osteoarthritis   . Spinal compression fracture ([place])   . Vertigo    Past Surgical History:  Procedure Laterality Date  . APPENDECTOMY    . CATARACT EXTRACTION    . COLON RESECTION    . TONSILLECTOMY     Social History   Tobacco Use  . Smoking status: Never Smoker  . Smokeless tobacco: Never Used  Substance Use Topics  . Alcohol  use: No    Alcohol/week: 0.0 standard drinks  . Drug use: No   Family History  Problem Relation Age of Onset  . Prostate cancer Father   . Heart attack Mother   . Gout Mother    Allergies  Allergen Reactions  . Amoxicillin-Pot Clavulanate Diarrhea    Has patient had a PCN reaction causing immediate rash, facial/tongue/throat swelling, SOB or lightheadedness with hypotension: Yes Has patient had a PCN reaction causing severe rash involving mucus membranes or skin necrosis: No Has patient had a PCN reaction that required hospitalization: No Has patient had a PCN reaction occurring within the last 10 years: No If all of the above answers are "NO", then may proceed with Cephalosporin use.   Marland Kitchen Keflex  [Cephalexin] Diarrhea  . Septra [Sulfamethoxazole-Trimethoprim] Nausea Only   Current Outpatient Medications on File Prior to Visit  Medication Sig Dispense Refill  . allopurinol (ZYLOPRIM) 100 MG tablet TAKE 2 TABLETS BY MOUTH  DAILY 180 tablet 3  . ALPRAZolam (XANAX) 0.5 MG tablet 1/2 pill by mouth up to twice daily as needed for anxiety 10 tablet 0  . Ascorbic Acid (VITAMIN C) 100 MG tablet Take 100 mg by mouth daily.    Marland Kitchen aspirin 81 MG tablet Take 81 mg by mouth daily.      . calcium-vitamin D (CALCIUM 500 +D) 500 MG tablet Take 1 tablet by mouth daily.     Marland Kitchen CRANBERRY PO Take 1 capsule by mouth 2 (two) times daily.    Marland Kitchen gabapentin (NEURONTIN) 300 MG capsule Take 300 mg by mouth 2 (two) times daily.   6  . glucosamine-chondroitin 500-400 MG tablet Take 1 tablet by mouth daily.    Marland Kitchen HYDROcodone-acetaminophen (NORCO/VICODIN) 5-325 MG tablet Take 1 tablet by mouth every 4 (four) hours as needed for moderate pain. 20 tablet 0  . losartan (COZAAR) 100 MG tablet TAKE 1 TABLET BY MOUTH  DAILY 90 tablet 3  . meclizine (ANTIVERT) 25 MG tablet TAKE 1 TABLET (25 MG TOTAL) BY MOUTH 3 (THREE) TIMES  DAILY AS NEEDED. 270 tablet 0  . metoprolol tartrate (LOPRESSOR) 50 MG tablet Take 1 tablet (50 mg total) by mouth 2 (two) times daily. 180 tablet 3  . multivitamin (THERAGRAN) per tablet Take 1 tablet by mouth daily.      Marland Kitchen PARoxetine (PAXIL) 30 MG tablet TAKE 1 TABLET BY MOUTH  EVERY MORNING 90 tablet 3  . Probiotic Product (PROBIOTIC DAILY PO) Take 1 tablet by mouth daily.    . raloxifene (EVISTA) 60 MG tablet TAKE 1 TABLET BY MOUTH DAILY. 90 tablet 1  . [DISCONTINUED] Glucosamine-Chondroitin 1500-1200 MG/30ML LIQD Take by mouth. As dierected      No current facility-administered medications on file prior to visit.      Review of Systems  Constitutional: Negative for activity change, appetite change, fatigue, fever and unexpected weight change.  HENT: Negative for congestion, ear pain, rhinorrhea,  sinus pressure and sore throat.   Eyes: Negative for pain, redness and visual disturbance.  Respiratory: Negative for cough, shortness of breath and wheezing.   Cardiovascular: Negative for chest pain and palpitations.  Gastrointestinal: Negative for abdominal pain, blood in stool, constipation and diarrhea.  Endocrine: Negative for polydipsia and polyuria.  Genitourinary: Negative for dysuria, frequency and urgency.  Musculoskeletal: Negative for arthralgias, back pain and myalgias.       L hand swelling and bruising (not painfu)    Skin: Negative for pallor and rash.  Allergic/Immunologic: Negative for environmental  allergies.  Neurological: Negative for dizziness, tremors, seizures, syncope, facial asymmetry, speech difficulty, weakness, light-headedness, numbness and headaches.       Poor balance  Intermittent confusion  Hematological: Negative for adenopathy. Does not bruise/bleed easily.  Psychiatric/Behavioral: Positive for confusion and hallucinations. Negative for decreased concentration and dysphoric mood. The patient is not nervous/anxious.        Objective:   Physical Exam  Constitutional: She is oriented to person, place, and time. She appears well-developed and well-nourished. No distress.  Well appearing elderly female- mildly confused   HENT:  Head: Normocephalic and atraumatic.  Mouth/Throat: Oropharynx is clear and moist.  Eyes: Pupils are equal, round, and reactive to light. Conjunctivae and EOM are normal. No scleral icterus.  Neck: Normal range of motion. Neck supple.  Cardiovascular: Normal rate, regular rhythm and normal heart sounds.  Pulmonary/Chest: Effort normal and breath sounds normal. No stridor. No respiratory distress. She has no wheezes.  Abdominal: Soft. Bowel sounds are normal. She exhibits no distension. There is no tenderness.  Musculoskeletal: She exhibits edema. She exhibits no deformity.  L hand is diffusely swollen and bruised (worse on palm)    No bony tenderness and nl rom  Mild tenderness over mid dorsal wrist  No deformity or neuro changes   Lymphadenopathy:    She has no cervical adenopathy.  Neurological: She is alert and oriented to person, place, and time. She displays normal reflexes. No cranial nerve deficit or sensory deficit. She exhibits normal muscle tone. Coordination normal.  Skin: Skin is warm and dry. No rash noted.  Abrasion of L knee -2-3 cm / clean   Diffuse ecchymosis of L hand     Psychiatric: She has a normal mood and affect. Her speech is tangential. Cognition and memory are impaired.  Pleasant  Mild confusion/impaired cognition Tangential in speech Argues with daughter at times           Assessment & Plan:   Problem List Items Addressed This Visit      Genitourinary   UTI (urinary tract infection)    Pos ua pending cx More confused/less balanced and had a fall  Cover with cipro (due ot other drug intolerances)  Enc fluids      Relevant Orders   Urine Culture (Completed)     Other   Episodic confusion    Suspect this time due to uti (tx and cx pending)  Mildly confused today-argues with daughter      Fall at home    Suspect due to uti  Disc use of walker to help prev falls Sustained hand injury/xray done  Also abrasion to knee (disc wound care)      Hand injury, left, initial encounter - Primary    Mild dorsal wrist tenderness on palp Nl rom Very swollen and bruised  Xray obtained  Disc wrapping if needed for comfort       Relevant Orders   DG Hand Complete Left (Completed)   DG Wrist Complete Left (Completed)    Other Visit Diagnoses    Confusion       Relevant Orders   POCT Urinalysis Dipstick (Automated) (Completed)

## 2018-09-02 LAB — URINE CULTURE
MICRO NUMBER:: 91312126
SPECIMEN QUALITY:: ADEQUATE

## 2018-09-03 NOTE — Assessment & Plan Note (Signed)
Pos ua pending cx More confused/less balanced and had a fall  Cover with cipro (due ot other drug intolerances)  Enc fluids

## 2018-09-03 NOTE — Assessment & Plan Note (Signed)
Suspect this time due to uti (tx and cx pending)  Mildly confused today-argues with daughter

## 2018-09-03 NOTE — Assessment & Plan Note (Addendum)
Suspect due to uti  Disc use of walker to help prev falls Sustained hand injury/xray done  Also abrasion to knee (disc wound care)

## 2018-09-03 NOTE — Assessment & Plan Note (Signed)
Mild dorsal wrist tenderness on palp Nl rom Very swollen and bruised  Xray obtained  Disc wrapping if needed for comfort

## 2018-09-06 ENCOUNTER — Ambulatory Visit: Payer: MEDICARE | Admitting: Podiatry

## 2018-09-07 ENCOUNTER — Telehealth: Payer: Self-pay | Admitting: *Deleted

## 2018-09-07 ENCOUNTER — Other Ambulatory Visit (INDEPENDENT_AMBULATORY_CARE_PROVIDER_SITE_OTHER): Payer: MEDICARE

## 2018-09-07 DIAGNOSIS — R8290 Unspecified abnormal findings in urine: Secondary | ICD-10-CM

## 2018-09-07 DIAGNOSIS — R4182 Altered mental status, unspecified: Secondary | ICD-10-CM

## 2018-09-07 LAB — POC URINALSYSI DIPSTICK (AUTOMATED)
Bilirubin, UA: NEGATIVE
Blood, UA: NEGATIVE
Glucose, UA: NEGATIVE
Ketones, UA: NEGATIVE
Nitrite, UA: NEGATIVE
Protein, UA: NEGATIVE
Spec Grav, UA: 1.025 (ref 1.010–1.025)
Urobilinogen, UA: 0.2 E.U./dL
pH, UA: 6 (ref 5.0–8.0)

## 2018-09-07 MED ORDER — CIPROFLOXACIN HCL 250 MG PO TABS: 250.0000 mg | ORAL_TABLET | Freq: Two times a day (BID) | ORAL | 0 refills | Status: DC

## 2018-09-07 NOTE — Telephone Encounter (Signed)
Pt's daughter notified of UA results and Dr. Marliss Coots instructions and verbalized understanding. Rx sent to pharmacy

## 2018-09-07 NOTE — Telephone Encounter (Signed)
-----   Message from Abner Greenspan, MD sent at 09/07/2018 12:34 PM EST ----- ua is improved - small leukocytes only (last time much more)  We will culture again  Encourage fluids  Please send in 5 more days of cipro 250 mg bid  Will see how culture returns

## 2018-09-08 LAB — URINE CULTURE
MICRO NUMBER:: 91336778
SPECIMEN QUALITY:: ADEQUATE

## 2018-09-20 ENCOUNTER — Ambulatory Visit (INDEPENDENT_AMBULATORY_CARE_PROVIDER_SITE_OTHER): Payer: MEDICARE | Admitting: Podiatry

## 2018-09-20 DIAGNOSIS — M79674 Pain in right toe(s): Secondary | ICD-10-CM | POA: Diagnosis not present

## 2018-09-20 DIAGNOSIS — M79675 Pain in left toe(s): Secondary | ICD-10-CM

## 2018-09-20 DIAGNOSIS — B351 Tinea unguium: Secondary | ICD-10-CM | POA: Diagnosis not present

## 2018-09-20 NOTE — Patient Instructions (Signed)

## 2018-09-23 ENCOUNTER — Other Ambulatory Visit: Payer: Self-pay | Admitting: Family Medicine

## 2018-10-10 ENCOUNTER — Encounter: Payer: Self-pay | Admitting: Podiatry

## 2018-10-10 NOTE — Progress Notes (Signed)
Subjective: Michelle Gregory presents today with painful, thick toenails 1-5 b/l that she cannot cut and which interfere with daily activities.  Pain is aggravated when wearing enclosed shoe gear. Pain is resolved with periodic debridement.   Current Outpatient Medications:  .  ALPRAZolam (XANAX) 0.5 MG tablet, 1/2 pill by mouth up to twice daily as needed for anxiety, Disp: 10 tablet, Rfl: 0 .  Ascorbic Acid (VITAMIN C) 100 MG tablet, Take 100 mg by mouth daily., Disp: , Rfl:  .  aspirin 81 MG tablet, Take 81 mg by mouth daily.  , Disp: , Rfl:  .  calcium-vitamin D (CALCIUM 500 +D) 500 MG tablet, Take 1 tablet by mouth daily. , Disp: , Rfl:  .  ciprofloxacin (CIPRO) 250 MG tablet, Take 1 tablet (250 mg total) by mouth 2 (two) times daily., Disp: 10 tablet, Rfl: 0 .  CRANBERRY PO, Take 1 capsule by mouth 2 (two) times daily., Disp: , Rfl:  .  gabapentin (NEURONTIN) 300 MG capsule, Take 300 mg by mouth 2 (two) times daily. , Disp: , Rfl: 6 .  glucosamine-chondroitin 500-400 MG tablet, Take 1 tablet by mouth daily., Disp: , Rfl:  .  HYDROcodone-acetaminophen (NORCO/VICODIN) 5-325 MG tablet, Take 1 tablet by mouth every 4 (four) hours as needed for moderate pain., Disp: 20 tablet, Rfl: 0 .  meclizine (ANTIVERT) 25 MG tablet, TAKE 1 TABLET (25 MG TOTAL) BY MOUTH 3 (THREE) TIMES  DAILY AS NEEDED., Disp: 270 tablet, Rfl: 0 .  metoprolol tartrate (LOPRESSOR) 50 MG tablet, Take 1 tablet (50 mg total) by mouth 2 (two) times daily., Disp: 180 tablet, Rfl: 3 .  multivitamin (THERAGRAN) per tablet, Take 1 tablet by mouth daily.  , Disp: , Rfl:  .  Probiotic Product (PROBIOTIC DAILY PO), Take 1 tablet by mouth daily., Disp: , Rfl:  .  raloxifene (EVISTA) 60 MG tablet, TAKE 1 TABLET BY MOUTH DAILY., Disp: 90 tablet, Rfl: 1 .  [DISCONTINUED] Glucosamine-Chondroitin 1500-1200 MG/30ML LIQD, Take by mouth. As dierected , Disp: , Rfl:  .  allopurinol (ZYLOPRIM) 100 MG tablet, TAKE 2 TABLETS BY MOUTH  DAILY, Disp: 180  tablet, Rfl: 1 .  losartan (COZAAR) 100 MG tablet, TAKE 1 TABLET BY MOUTH  DAILY, Disp: 90 tablet, Rfl: 1 .  PARoxetine (PAXIL) 30 MG tablet, TAKE 1 TABLET BY MOUTH  EVERY MORNING, Disp: 90 tablet, Rfl: 1  Allergies  Allergen Reactions  . Amoxicillin-Pot Clavulanate Diarrhea    Has patient had a PCN reaction causing immediate rash, facial/tongue/throat swelling, SOB or lightheadedness with hypotension: Yes Has patient had a PCN reaction causing severe rash involving mucus membranes or skin necrosis: No Has patient had a PCN reaction that required hospitalization: No Has patient had a PCN reaction occurring within the last 10 years: No If all of the above answers are "NO", then may proceed with Cephalosporin use.   Marland Kitchen Keflex [Cephalexin] Diarrhea  . Septra [Sulfamethoxazole-Trimethoprim] Nausea Only    Objective:  Vascular Examination: Capillary refill time immediate x 10 digits Dorsalis pedis and Posterior tibial pulses palpable b/l Digital hair present x 10 digits Skin temperature gradient WNL b/l  Dermatological Examination: Skin with normal turgor, texture and tone b/l  Toenails 1-5 b/l discolored, thick, dystrophic with subungual debris and pain with palpation to nailbeds due to thickness of nails.  No open lesions.  No interdigital macerations.  Musculoskeletal: Muscle strength 5/5 to all LE muscle groups  Neurological: Sensation intact with 10 gram monofilament. Vibratory sensation intact.  Assessment: Painful onychomycosis toenails 1-5 b/l   Plan: 1. Toenails 1-5 b/l were debrided in length and girth without iatrogenic bleeding. 2. Patient to continue soft, supportive shoe gear 3. Patient to report any pedal injuries to medical professional immediately. 4. Follow up 3 months. Patient/POA to call should there be a concern in the interim.

## 2018-10-14 ENCOUNTER — Telehealth: Payer: Self-pay | Admitting: *Deleted

## 2018-10-14 NOTE — Telephone Encounter (Signed)
Left VM letting pt's daughter know Dr. Marliss Coots comments

## 2018-10-14 NOTE — Telephone Encounter (Signed)
Her last vitamin B12 level was normal (in the middle of normal range) so she probably does not need it  If she is more tired and you want to try it for a month however-that would be ok

## 2018-10-14 NOTE — Telephone Encounter (Signed)
Spoke to pts daughter, [name], who states that her 82yr old MIL was advised to take b12 to assist with her memory. [name] is wanting to know if Dr Glori Bickers thinks pt would have any beneficial use for b12 as well. She prefers to have Dr Marliss Coots opinion before starting her on anything new. pls advise

## 2018-11-05 ENCOUNTER — Other Ambulatory Visit: Payer: Self-pay | Admitting: Family Medicine

## 2018-11-21 ENCOUNTER — Telehealth: Payer: Self-pay | Admitting: *Deleted

## 2018-11-21 NOTE — Telephone Encounter (Signed)
Spoke to pts daughter [name] who states she is needing a call from Dr Glori Bickers directly. She states that she has previously spoken with Dr Glori Bickers regarding pt's worsening dementia, but the only dramatic change is that pt does not recognize her. [name] states she is needing to speak with Dr Glori Bickers to discuss how to proceed. States this is not an urgent matter, and ok if she receives a call back at a later time. pls advise

## 2018-11-22 ENCOUNTER — Encounter: Payer: Self-pay | Admitting: Family Medicine

## 2018-11-22 DIAGNOSIS — F0390 Unspecified dementia without behavioral disturbance: Secondary | ICD-10-CM | POA: Insufficient documentation

## 2018-11-22 NOTE — Telephone Encounter (Signed)
Her age related dementia is progressing - is not recognizing her daughter [name]) and asking for "the other [name]" She is staying with family at night and keeping a strict schedule No stroke symptoms at all  No s/s of infection or uti (they watch closely)  CT in June showed age related changes   Not interested in medication for dementia at this age  85 she is safe and want to avoid side effects -will alert me if they change their mind   Disc some strategies for dealing with her confusion and argumentativeness Will alert [place] if she becomes physically agitated   Will continue to watch closely

## 2018-11-28 ENCOUNTER — Telehealth: Payer: Self-pay

## 2018-11-28 NOTE — Telephone Encounter (Signed)
[facility] Patient Name: Michelle Gregory Gender: Female DOB: [birth year] Age: 85 Y 32 M 30 D Return Phone Number: [phone] (Primary), [phone] (Secondary) Address: City/State/Zip: [place] [address] Client [facility] Night - Client Client Site [facility], Roque Lias - MD Contact Type Call Who Is Calling Patient / Member / Family / Caregiver Call Type Triage / Clinical Caller Name [name] Relationship To Patient Daughter Return Phone Number [phone] (Primary) Chief Complaint CONFUSION - new onset Reason for Call Symptomatic / Request for [place] states she has fallen and arm is bruised, starting a UTI, Confusion is worse. Translation No Nurse Assessment Nurse: Leilani Merl, RN, Heather Date/Time (Eastern Time): 11/26/2018 11:12:44 AM Confirm and document reason for call. If symptomatic, describe symptoms. ---Caller states she has fallen and arm is bruised, starting a UTI, Confusion is worse. Does the patient have any new or worsening symptoms? ---Yes Will a triage be completed? ---Yes Related visit to physician within the last 2 weeks? ---No Does the PT have any chronic conditions? (i.e. diabetes, asthma, this includes High risk factors for pregnancy, etc.) ---Yes List chronic conditions. ---dementia Is this a behavioral health or substance abuse call? ---No Guidelines Guideline Title Affirmed Question Affirmed Notes Nurse Date/Time (Eastern Time) Confusion - Delirium Very strange or paranoid behavior Standifer, RN, Nira Conn 11/26/2018 11:13:52 AM Disp. Time [name] Time) Disposition Final User 11/26/2018 11:10:27 AM Send to Urgent Queue Baruch Goldmann 11/26/2018 11:16:31 AM Go to ED Now Yes Standifer, RN, Soyla Murphy Disagree/Comply Disagree Caller Understands Yes PLEASE NOTE: All timestamps  contained within this report are represented as [place] Standard Time. CONFIDENTIALTY NOTICE: This fax transmission is intended only for the addressee. It contains information that is legally privileged, confidential or otherwise protected from use or disclosure. If you are not the intended recipient, you are strictly prohibited from reviewing, disclosing, copying using or disseminating any of this information or taking any action in reliance on or regarding this information. If you have received this fax in error, please notify us immediately by telephone so that we can arrange for its return to [place]. Phone: [phone], Toll-Free: [phone], Fax: [phone] Page: 2 of 2 Call Id: [id number] PreDisposition Call Doctor Care Advice Given Per Guideline GO TO ED NOW: DRIVING: Another adult should drive. CARE ADVICE given per Confusion-Delirium (Adult) guideline. Comments User: Ave Filter, RN Date/Time [name] Time): 11/26/2018 11:17:00 AM Caller states that she will take her mother to an [facility]. Referrals GO TO FACILITY UNDECIDED

## 2018-11-28 NOTE — Telephone Encounter (Signed)
I spoke with [name] pts daughter (DPR signed) pt refused to go to UC over weekend. [name] cleaned 2" laceration on hand, applied neosporin and wrapped hand. Hand is bruised with minimal swelling but pt is able to use her hand and [name] thinks her hand is OK. [name] said pt has had a tetanus shot in the past 5 years. Not on immunization list. [name] thinks UTI issue has resolved; no smell to urine now and confusion is about the same. [name] said the pt is clear on things that have just happened and knows [name]'s husband but pt does not know who [name] is.  Offered appt for pt to be seen today but [name] said pt is still asleep and when pt wakes up she will do assessment and cb if pt needs appt. FYI to Dr Glori Bickers.

## 2018-12-06 ENCOUNTER — Ambulatory Visit (INDEPENDENT_AMBULATORY_CARE_PROVIDER_SITE_OTHER)
Admission: RE | Admit: 2018-12-06 | Discharge: 2018-12-06 | Disposition: A | Discharge status: Home / Self Care | Payer: MEDICARE | Source: Ambulatory Visit | Attending: Family Medicine | Admitting: Family Medicine

## 2018-12-06 ENCOUNTER — Encounter: Payer: Self-pay | Admitting: Family Medicine

## 2018-12-06 ENCOUNTER — Ambulatory Visit (INDEPENDENT_AMBULATORY_CARE_PROVIDER_SITE_OTHER): Payer: MEDICARE | Admitting: Family Medicine

## 2018-12-06 VITALS — BP 94/62 | HR 80 | Temp 97.8°F | Ht 60.5 in

## 2018-12-06 DIAGNOSIS — S52609A Unspecified fracture of lower end of unspecified ulna, initial encounter for closed fracture: Secondary | ICD-10-CM | POA: Insufficient documentation

## 2018-12-06 DIAGNOSIS — S61511A Laceration without foreign body of right wrist, initial encounter: Secondary | ICD-10-CM | POA: Diagnosis not present

## 2018-12-06 DIAGNOSIS — Z23 Encounter for immunization: Secondary | ICD-10-CM | POA: Diagnosis not present

## 2018-12-06 DIAGNOSIS — D696 Thrombocytopenia, unspecified: Secondary | ICD-10-CM | POA: Diagnosis not present

## 2018-12-06 DIAGNOSIS — M25531 Pain in right wrist: Secondary | ICD-10-CM

## 2018-12-06 DIAGNOSIS — F0390 Unspecified dementia without behavioral disturbance: Secondary | ICD-10-CM | POA: Diagnosis not present

## 2018-12-06 DIAGNOSIS — S52611A Displaced fracture of right ulna styloid process, initial encounter for closed fracture: Secondary | ICD-10-CM | POA: Diagnosis not present

## 2018-12-06 DIAGNOSIS — S52601A Unspecified fracture of lower end of right ulna, initial encounter for closed fracture: Secondary | ICD-10-CM | POA: Diagnosis not present

## 2018-12-06 NOTE — Progress Notes (Signed)
Subjective:    Patient ID: Michelle Gregory, female    DOB: [birth year], 85 y.o.   MRN: [id number]  HPI Here for a fall with R arm injury   Had a fall in a small bathroom (slippery socks)  Fell against the bathtub  Cut her wrist  Still hurts - improved  Some swelling  Was bruised   She can grip  Is R handed   Wound bled just a little  Used neosporin and bandage   Xray today: Dg Wrist Complete Right  Result Date: 12/06/2018 CLINICAL DATA:  Pain following fall EXAM: RIGHT WRIST - COMPLETE 3+ VIEW COMPARISON:  None. FINDINGS: Frontal, oblique, lateral, and ulnar deviation scaphoid images were obtained. There is an obliquely oriented fracture of the distal ulna at the metaphysis-diaphysis junction with mild medial displacement of the distal fracture fragment. There is a subtle impaction injury in the distal radial metaphysis with alignment in this area anatomic. No other fractures are evident. No dislocations. There is moderate osteoarthritic change in the radiocarpal, scaphotrapezial common first carpal-metacarpal joints. Foci of calcification are noted in the triangular fibrocartilage region. Bones are diffusely osteoporotic. There are foci of arterial vascular calcification. IMPRESSION: 1. Displaced fracture of the distal ulnar metaphysis-diaphysis junction. 2.  Subtle impaction injury involving the distal radial metaphysis. 3.  No dislocation. 4.  Bones osteoporotic. 5. Osteoarthritic change at several sites. Calcification in the triangular fibrocartilage region may have arthropathic etiology but also could represent previous tear in this area. 6.  There are foci of arterial vascular calcification. These results will be called to the ordering clinician or representative by the Radiologist Assistant, and communication documented in the PACS or zVision Dashboard. Electronically Signed   By: Lowella Grip III M.D.   On: 12/06/2018 12:38      Wt Readings from Last 3 Encounters:  09/01/18  116 lb 12 oz (53 kg)  07/27/18 116 lb 8 oz (52.8 kg)  05/03/18 111 lb 8 oz (50.6 kg)   22.43 kg/m   BP Readings from Last 3 Encounters:  12/06/18 94/62  09/01/18 116/70  07/27/18 112/70    Patient Active Problem List   Diagnosis Date Noted  . Right wrist pain 12/06/2018  . Laceration of wrist, right 12/06/2018  . Fracture, ulna, distal 12/06/2018  . Dementia, senile ([place]) 11/22/2018  . Hand injury, left, initial encounter 09/01/2018  . H/O falling 05/03/2018  . Episodic confusion 05/03/2018  . Sacral fracture ([place]) 04/03/2018  . Fall at home 03/29/2018  . Anxiety disorder 03/29/2018  . Fatigue 03/29/2018  . Chronic pain 02/09/2018  . Cerumen impaction 12/06/2017  . H/O Clostridium difficile infection 10/13/2017  . Recurrent UTI (urinary tract infection) 09/06/2017  . Auditory hallucinations 03/03/2017  . Female cystocele 04/13/2016  . Bowel habit changes 12/30/2015  . Compression fracture 12/30/2015  . Routine general medical examination at a health care facility 04/09/2015  . UTI (urinary tract infection) 05/09/2014  . Candidal intertrigo 03/28/2014  . Encounter for Medicare annual wellness exam 03/21/2013  . Gout 09/19/2012  . Osteopenia 05/05/2011  . Degenerative lumbar disc 01/20/2011  . VARICOSE VEINS, LOWER EXTREMITIES 09/04/2010  . Hyperglycemia 01/08/2010  . Hyperlipidemia 05/09/2008  . Essential hypertension 05/09/2008  . HEMORRHOIDS, INTERNAL 05/09/2008  . Allergic rhinitis 05/09/2008  . Mild reactive airways disease 05/09/2008  . GERD 05/09/2008  . IBS 05/09/2008  . Osteoarthritis 05/09/2008  . History of malignant neoplasm of large intestine 05/09/2008   Past Medical History:  Diagnosis Date  .  Allergic rhinitis   . Anxiety   . C. difficile diarrhea 10/2017  . Cancer ([place])   . Depression   . GERD (gastroesophageal reflux disease)   . History of colon cancer   . History of recurrent UTIs   . Hyperlipidemia   . Hypertension   . Osteoarthritis    . Spinal compression fracture ([place])   . Vertigo    Past Surgical History:  Procedure Laterality Date  . APPENDECTOMY    . CATARACT EXTRACTION    . COLON RESECTION    . TONSILLECTOMY     Social History   Tobacco Use  . Smoking status: Never Smoker  . Smokeless tobacco: Never Used  Substance Use Topics  . Alcohol use: No    Alcohol/week: 0.0 standard drinks  . Drug use: No   Family History  Problem Relation Age of Onset  . Prostate cancer Father   . Heart attack Mother   . Gout Mother    Allergies  Allergen Reactions  . Amoxicillin-Pot Clavulanate Diarrhea    Has patient had a PCN reaction causing immediate rash, facial/tongue/throat swelling, SOB or lightheadedness with hypotension: Yes Has patient had a PCN reaction causing severe rash involving mucus membranes or skin necrosis: No Has patient had a PCN reaction that required hospitalization: No Has patient had a PCN reaction occurring within the last 10 years: No If all of the above answers are "NO", then may proceed with Cephalosporin use.   Marland Kitchen Keflex [Cephalexin] Diarrhea  . Septra [Sulfamethoxazole-Trimethoprim] Nausea Only   Current Outpatient Medications on File Prior to Visit  Medication Sig Dispense Refill  . allopurinol (ZYLOPRIM) 100 MG tablet TAKE 2 TABLETS BY MOUTH  DAILY 180 tablet 1  . ALPRAZolam (XANAX) 0.5 MG tablet 1/2 pill by mouth up to twice daily as needed for anxiety 10 tablet 0  . Ascorbic Acid (VITAMIN C) 100 MG tablet Take 100 mg by mouth daily.    Marland Kitchen aspirin 81 MG tablet Take 81 mg by mouth daily.      . calcium-vitamin D (CALCIUM 500 +D) 500 MG tablet Take 1 tablet by mouth daily.     Marland Kitchen CRANBERRY PO Take 1 capsule by mouth 2 (two) times daily.    Marland Kitchen gabapentin (NEURONTIN) 300 MG capsule Take 300 mg by mouth 2 (two) times daily.   6  . glucosamine-chondroitin 500-400 MG tablet Take 1 tablet by mouth daily.    Marland Kitchen HYDROcodone-acetaminophen (NORCO/VICODIN) 5-325 MG tablet Take 1 tablet by mouth  every 4 (four) hours as needed for moderate pain. 20 tablet 0  . losartan (COZAAR) 100 MG tablet TAKE 1 TABLET BY MOUTH  DAILY 90 tablet 1  . meclizine (ANTIVERT) 25 MG tablet TAKE 1 TABLET (25 MG TOTAL) BY MOUTH 3 (THREE) TIMES  DAILY AS NEEDED. 270 tablet 0  . metoprolol tartrate (LOPRESSOR) 50 MG tablet TAKE 1 TABLET BY MOUTH TWO  TIMES DAILY 180 tablet 1  . multivitamin (THERAGRAN) per tablet Take 1 tablet by mouth daily.      Marland Kitchen PARoxetine (PAXIL) 30 MG tablet TAKE 1 TABLET BY MOUTH  EVERY MORNING 90 tablet 1  . Probiotic Product (PROBIOTIC DAILY PO) Take 1 tablet by mouth daily.    . raloxifene (EVISTA) 60 MG tablet TAKE 1 TABLET BY MOUTH DAILY. 90 tablet 1  . [DISCONTINUED] Glucosamine-Chondroitin 1500-1200 MG/30ML LIQD Take by mouth. As dierected      No current facility-administered medications on file prior to visit.     Review of  Systems  Constitutional: Negative for activity change, appetite change, fatigue, fever and unexpected weight change.  HENT: Negative for congestion, ear pain, rhinorrhea, sinus pressure and sore throat.   Eyes: Negative for pain, redness and visual disturbance.  Respiratory: Negative for cough, shortness of breath and wheezing.   Cardiovascular: Negative for chest pain and palpitations.  Gastrointestinal: Negative for abdominal pain, blood in stool, constipation and diarrhea.       Trouble cleaning herself after BMs  Endocrine: Negative for polydipsia and polyuria.  Genitourinary: Negative for dysuria, frequency and urgency.  Musculoskeletal: Negative for arthralgias, back pain and myalgias.       Right wrist pain and swelling   Skin: Negative for pallor and rash.  Allergic/Immunologic: Negative for environmental allergies.  Neurological: Negative for dizziness, syncope and headaches.  Hematological: Negative for adenopathy. Does not bruise/bleed easily.  Psychiatric/Behavioral: Negative for decreased concentration and dysphoric mood. The patient is not  nervous/anxious.        Dementia- confusion at times        Objective:   Physical Exam Constitutional:      General: She is not in acute distress.    Appearance: Normal appearance. She is normal weight. She is not ill-appearing.     Comments: Frail appearing elderly female   HENT:     Head: Atraumatic.     Mouth/Throat:     Mouth: Mucous membranes are moist.     Pharynx: Oropharynx is clear.  Eyes:     Extraocular Movements: Extraocular movements intact.     Conjunctiva/sclera: Conjunctivae normal.     Pupils: Pupils are equal, round, and reactive to light.  Neck:     Musculoskeletal: Normal range of motion and neck supple. No neck rigidity.  Cardiovascular:     Rate and Rhythm: Normal rate and regular rhythm.  Pulmonary:     Effort: Pulmonary effort is normal. No respiratory distress.     Breath sounds: No wheezing or rales.  Musculoskeletal:        General: Swelling, tenderness and signs of injury present. No deformity.     Right wrist: She exhibits tenderness, bony tenderness, swelling and laceration. She exhibits normal range of motion, no effusion, no crepitus and no deformity.     Comments: R wrist - mild to moderate swelling with ecchymosis (now yellow and resolving)  Tender over distal radius and ulna No crepitus or step off or obv deformity  Pain with full extension and pronation Nl perf and sensation  No hand swelling or tenderness  Superficial laceration 3-4 cm curved -well healing w/o sign of infection   Lymphadenopathy:     Cervical: No cervical adenopathy.  Skin:    General: Skin is warm and dry.     Capillary Refill: Capillary refill takes less than 2 seconds.     Findings: No rash.     Comments: Old bruise over R wrist  Healing laceration dorsally 3-4 cm -no oozing/erythema warmth  Neurological:     Mental Status: She is alert. Mental status is at baseline.  Psychiatric:        Mood and Affect: Mood normal.           Assessment & Plan:    Problem List Items Addressed This Visit      Nervous and Auditory   Dementia, senile ([place])    Pt continues to not recognize daughter (argues about it but does not seem agitated or anxious)  After disc with family - declined any medication for  dementia at this time and age         Musculoskeletal and Integument   Fracture, ulna, distal - Primary    Distal ulnar fracture as well as radial impaction  Some pain and swelling  Sustained in fall approx a week ago  Ref to ortho urgently  Ace given for protection on way there       Relevant Orders   Ambulatory referral to Orthopedic Surgery     Other   Right wrist pain   Relevant Orders   DG Wrist Complete Right (Completed)   Laceration of wrist, right    Healing from inj a week ago  Continue soap /water/neosporin and loose cover  Td shot today      Relevant Orders   Td : Tetanus/diphtheria >7yo Preservative  free (Completed)   Thrombocytopenia (HCC)    Mild - 131 pl count last No excessive bleeding

## 2018-12-06 NOTE — Assessment & Plan Note (Signed)
Distal ulnar fracture as well as radial impaction  Some pain and swelling  Sustained in fall approx a week ago  Ref to ortho urgently  Ace given for protection on way there

## 2018-12-06 NOTE — Assessment & Plan Note (Signed)
Healing from inj a week ago  Continue soap /water/neosporin and loose cover  Td shot today

## 2018-12-06 NOTE — Patient Instructions (Addendum)
Tetanus shot today   Keep wound clean with soap and water Lightly cover if needed Antibiotic ointment is ok   There is a wrist fracture  We will refer you to orthopedics now

## 2018-12-06 NOTE — Assessment & Plan Note (Signed)
Mild - 131 pl count last No excessive bleeding

## 2018-12-06 NOTE — Assessment & Plan Note (Signed)
Pt continues to not recognize daughter (argues about it but does not seem agitated or anxious)  After disc with family - declined any medication for dementia at this time and age

## 2018-12-07 ENCOUNTER — Other Ambulatory Visit: Payer: Self-pay | Admitting: *Deleted

## 2018-12-07 MED ORDER — MECLIZINE HCL 25 MG PO TABS: 25.0000 mg | ORAL_TABLET | Freq: Three times a day (TID) | ORAL | 0 refills | Status: DC | PRN

## 2018-12-07 NOTE — Telephone Encounter (Signed)
Pt had an acute fall appt yesterday. Last filled on 12/11/16 #270 tab with 0 refills

## 2018-12-09 DIAGNOSIS — S52691A Other fracture of lower end of right ulna, initial encounter for closed fracture: Secondary | ICD-10-CM | POA: Diagnosis not present

## 2018-12-09 DIAGNOSIS — M25531 Pain in right wrist: Secondary | ICD-10-CM | POA: Diagnosis not present

## 2019-01-18 ENCOUNTER — Other Ambulatory Visit: Payer: Self-pay | Admitting: Family Medicine

## 2019-01-19 NOTE — Telephone Encounter (Signed)
meds are not due for a refill yet. All of them are to early

## 2019-01-23 ENCOUNTER — Telehealth: Payer: Self-pay

## 2019-01-23 ENCOUNTER — Other Ambulatory Visit: Payer: Self-pay

## 2019-01-23 ENCOUNTER — Encounter: Payer: Self-pay | Admitting: Family Medicine

## 2019-01-23 ENCOUNTER — Ambulatory Visit (INDEPENDENT_AMBULATORY_CARE_PROVIDER_SITE_OTHER): Payer: MEDICARE | Admitting: Family Medicine

## 2019-01-23 ENCOUNTER — Other Ambulatory Visit: Payer: Self-pay | Admitting: Family Medicine

## 2019-01-23 VITALS — BP 126/74 | HR 80 | Temp 98.8°F | Ht 60.5 in | Wt 112.4 lb

## 2019-01-23 DIAGNOSIS — B029 Zoster without complications: Secondary | ICD-10-CM | POA: Insufficient documentation

## 2019-01-23 MED ORDER — VALACYCLOVIR HCL 1 G PO TABS: 1000.0000 mg | ORAL_TABLET | Freq: Three times a day (TID) | ORAL | 0 refills | Status: DC

## 2019-01-23 MED ORDER — RALOXIFENE HCL 60 MG PO TABS: 60.0000 mg | ORAL_TABLET | Freq: Every day | ORAL | 1 refills | Status: DC

## 2019-01-23 MED ORDER — ALLOPURINOL 100 MG PO TABS: 200.0000 mg | ORAL_TABLET | Freq: Every day | ORAL | 1 refills | Status: DC

## 2019-01-23 MED ORDER — LOSARTAN POTASSIUM 100 MG PO TABS: 100.0000 mg | ORAL_TABLET | Freq: Every day | ORAL | 1 refills | Status: DC

## 2019-01-23 MED ORDER — METOPROLOL TARTRATE 50 MG PO TABS: 50.0000 mg | ORAL_TABLET | Freq: Two times a day (BID) | ORAL | 1 refills | Status: DC

## 2019-01-23 MED ORDER — PAROXETINE HCL 30 MG PO TABS: 30.0000 mg | ORAL_TABLET | Freq: Every morning | ORAL | 1 refills | Status: DC

## 2019-01-23 NOTE — Assessment & Plan Note (Addendum)
Left side T10-11 dermatomes  Pt has little to no discomfort (already on narcotic pain medication)  Has not had a zoster vaccine Disc zoster and what to expect  Handout given  Disc cleaning with soap and water/ (abx oint to abraded areas if needed)  Px for valcyclovir 1 g tid for 7d  inst to alert if more pain/ signs of infection or other changes

## 2019-01-23 NOTE — Progress Notes (Signed)
Subjective:    Patient ID: Michelle Gregory, female    DOB: [birth year], 85 y.o.   MRN: [id number]  HPI Here for a rash on hip and back   This came up last night  Not itching or hurting at all  Perhaps a little sensitive  She already takes noco for chronic pain  Has not had a shingles vaccine Has not been around anyone with shingles    Feels ok /no fever or fatigue or headache  Also needs medicines refilled today in light of covid outbreak and wanting to avoid trips out - doing mail order  Patient Active Problem List   Diagnosis Date Noted  . Shingles 01/23/2019  . Right wrist pain 12/06/2018  . Laceration of wrist, right 12/06/2018  . Fracture, ulna, distal 12/06/2018  . Thrombocytopenia ([place]) 12/06/2018  . Dementia, senile ([place]) 11/22/2018  . Hand injury, left, initial encounter 09/01/2018  . H/O falling 05/03/2018  . Episodic confusion 05/03/2018  . Sacral fracture ([place]) 04/03/2018  . Fall at home 03/29/2018  . Anxiety disorder 03/29/2018  . Fatigue 03/29/2018  . Chronic pain 02/09/2018  . Cerumen impaction 12/06/2017  . H/O Clostridium difficile infection 10/13/2017  . Recurrent UTI (urinary tract infection) 09/06/2017  . Auditory hallucinations 03/03/2017  . Female cystocele 04/13/2016  . Bowel habit changes 12/30/2015  . Compression fracture 12/30/2015  . Routine general medical examination at a health care facility 04/09/2015  . UTI (urinary tract infection) 05/09/2014  . Candidal intertrigo 03/28/2014  . Encounter for Medicare annual wellness exam 03/21/2013  . Gout 09/19/2012  . Osteopenia 05/05/2011  . Degenerative lumbar disc 01/20/2011  . VARICOSE VEINS, LOWER EXTREMITIES 09/04/2010  . Hyperglycemia 01/08/2010  . Hyperlipidemia 05/09/2008  . Essential hypertension 05/09/2008  . HEMORRHOIDS, INTERNAL 05/09/2008  . Allergic rhinitis 05/09/2008  . Mild reactive airways disease 05/09/2008  . GERD 05/09/2008  . IBS 05/09/2008  . Osteoarthritis 05/09/2008   . History of malignant neoplasm of large intestine 05/09/2008   Past Medical History:  Diagnosis Date  . Allergic rhinitis   . Anxiety   . C. difficile diarrhea 10/2017  . Cancer ([place])   . Depression   . GERD (gastroesophageal reflux disease)   . History of colon cancer   . History of recurrent UTIs   . Hyperlipidemia   . Hypertension   . Osteoarthritis   . Spinal compression fracture ([place])   . Vertigo    Past Surgical History:  Procedure Laterality Date  . APPENDECTOMY    . CATARACT EXTRACTION    . COLON RESECTION    . TONSILLECTOMY     Social History   Tobacco Use  . Smoking status: Never Smoker  . Smokeless tobacco: Never Used  Substance Use Topics  . Alcohol use: No    Alcohol/week: 0.0 standard drinks  . Drug use: No   Family History  Problem Relation Age of Onset  . Prostate cancer Father   . Heart attack Mother   . Gout Mother    Allergies  Allergen Reactions  . Amoxicillin-Pot Clavulanate Diarrhea    Has patient had a PCN reaction causing immediate rash, facial/tongue/throat swelling, SOB or lightheadedness with hypotension: Yes Has patient had a PCN reaction causing severe rash involving mucus membranes or skin necrosis: No Has patient had a PCN reaction that required hospitalization: No Has patient had a PCN reaction occurring within the last 10 years: No If all of the above answers are "NO", then may proceed with Cephalosporin use.   Marland Kitchen  Keflex [Cephalexin] Diarrhea  . Septra [Sulfamethoxazole-Trimethoprim] Nausea Only   Current Outpatient Medications on File Prior to Visit  Medication Sig Dispense Refill  . ALPRAZolam (XANAX) 0.5 MG tablet 1/2 pill by mouth up to twice daily as needed for anxiety 10 tablet 0  . Ascorbic Acid (VITAMIN C) 100 MG tablet Take 100 mg by mouth daily.    Marland Kitchen aspirin 81 MG tablet Take 81 mg by mouth daily.      . calcium-vitamin D (CALCIUM 500 +D) 500 MG tablet Take 1 tablet by mouth daily.     Marland Kitchen CRANBERRY PO Take 1  capsule by mouth 2 (two) times daily.    Marland Kitchen gabapentin (NEURONTIN) 300 MG capsule Take 300 mg by mouth 2 (two) times daily.   6  . glucosamine-chondroitin 500-400 MG tablet Take 1 tablet by mouth daily.    Marland Kitchen HYDROcodone-acetaminophen (NORCO/VICODIN) 5-325 MG tablet Take 1 tablet by mouth every 4 (four) hours as needed for moderate pain. 20 tablet 0  . meclizine (ANTIVERT) 25 MG tablet Take 1 tablet (25 mg total) by mouth 3 (three) times daily as needed for dizziness. 270 tablet 0  . multivitamin (THERAGRAN) per tablet Take 1 tablet by mouth daily.      . Probiotic Product (PROBIOTIC DAILY PO) Take 1 tablet by mouth daily.    . [DISCONTINUED] Glucosamine-Chondroitin 1500-1200 MG/30ML LIQD Take by mouth. As dierected      No current facility-administered medications on file prior to visit.     Review of Systems  Constitutional: Negative for activity change, appetite change, fatigue, fever and unexpected weight change.  HENT: Negative for congestion, ear pain, rhinorrhea, sinus pressure and sore throat.   Eyes: Negative for pain, redness and visual disturbance.  Respiratory: Negative for cough, shortness of breath and wheezing.   Cardiovascular: Negative for chest pain and palpitations.  Gastrointestinal: Negative for abdominal pain, blood in stool, constipation and diarrhea.  Endocrine: Negative for polydipsia and polyuria.  Genitourinary: Negative for dysuria, frequency and urgency.  Musculoskeletal: Positive for arthralgias. Negative for back pain and myalgias.  Skin: Positive for rash. Negative for pallor.  Allergic/Immunologic: Negative for environmental allergies.  Neurological: Negative for dizziness, syncope and headaches.  Hematological: Negative for adenopathy. Does not bruise/bleed easily.  Psychiatric/Behavioral: Negative for decreased concentration and dysphoric mood. The patient is not nervous/anxious.        Objective:   Physical Exam Constitutional:      General: She is  not in acute distress.    Appearance: Normal appearance. She is normal weight. She is not ill-appearing.     Comments: Frail appearing   HENT:     Head: Normocephalic and atraumatic.     Nose: Nose normal.     Mouth/Throat:     Mouth: Mucous membranes are moist.     Pharynx: Oropharynx is clear.  Eyes:     General: No scleral icterus.    Extraocular Movements: Extraocular movements intact.     Conjunctiva/sclera: Conjunctivae normal.     Pupils: Pupils are equal, round, and reactive to light.  Neck:     Musculoskeletal: Normal range of motion.  Cardiovascular:     Rate and Rhythm: Normal rate and regular rhythm.     Heart sounds: Normal heart sounds.  Pulmonary:     Effort: Pulmonary effort is normal. No respiratory distress.     Breath sounds: Normal breath sounds. No wheezing or rales.  Abdominal:     General: Abdomen is flat. Bowel sounds are normal.  There is no distension.     Palpations: Abdomen is soft.  Lymphadenopathy:     Cervical: No cervical adenopathy.  Skin:    General: Skin is warm and dry.     Comments: Erythematous rash with vesicles and some scabbing over T10 and 11 dermatomes on L side of the body Mildly tender  No other rash  No rash on the R side of body at all   Neurological:     Mental Status: She is alert.     Cranial Nerves: No cranial nerve deficit.     Coordination: Coordination normal.  Psychiatric:        Mood and Affect: Mood normal.     Comments: Repeats herself  Pleasant and answers questions appropriately           Assessment & Plan:   Problem List Items Addressed This Visit      Other   Shingles - Primary    Left side T10-11 dermatomes  Pt has little to no discomfort (already on narcotic pain medication)  Has not had a zoster vaccine Disc zoster and what to expect  Handout given  Disc cleaning with soap and water/ (abx oint to abraded areas if needed)  Px for valcyclovir 1 g tid for 7d  inst to alert if more pain/ signs of  infection or other changes         Relevant Medications   valACYclovir (VALTREX) 1000 MG tablet

## 2019-01-23 NOTE — Telephone Encounter (Signed)
Opened in error

## 2019-01-23 NOTE — Patient Instructions (Addendum)
I sent a px for valtrex to the pharmacy -this is to lessen the shingles virus   The pain medicine you are on is likely controlling pain  Keep rash clean and dry (soap and water)  Antibiotic ointment is ok for abraded areas     Shingles  Shingles, which is also known as herpes zoster, is an infection that causes a painful skin rash and fluid-filled blisters. It is caused by a virus. Shingles only develops in people who:  Have had chickenpox.  Have been given a medicine to protect against chickenpox (have been vaccinated). Shingles is rare in this group. What are the causes? Shingles is caused by varicella-zoster virus (VZV). This is the same virus that causes chickenpox. After a person is exposed to VZV, the virus stays in the body in an inactive (dormant) state. Shingles develops if the virus is reactivated. This can happen many years after the first (initial) exposure to VZV. It is not known what causes this virus to be reactivated. What increases the risk? People who have had chickenpox or received the chickenpox vaccine are at risk for shingles. Shingles infection is more common in people who:  Are older than age 40.  Have a weakened disease-fighting system (immune system), such as people with: ? HIV. ? AIDS. ? Cancer.  Are taking medicines that weaken the immune system, such as transplant medicines.  Are experiencing a lot of stress. What are the signs or symptoms? Early symptoms of this condition include itching, tingling, and pain in an area on your skin. Pain may be described as burning, stabbing, or throbbing. A few days or weeks after early symptoms start, a painful red rash appears. The rash is usually on one side of the body and has a band-like or belt-like pattern. The rash eventually turns into fluid-filled blisters that break open, change into scabs, and dry up in about 2-3 weeks. At any time during the infection, you may also develop:  A fever.  Chills.  A  headache.  An upset stomach. How is this diagnosed? This condition is diagnosed with a skin exam. Skin or fluid samples may be taken from the blisters before a diagnosis is made. These samples are examined under a microscope or sent to a lab for testing. How is this treated? The rash may last for several weeks. There is not a specific cure for this condition. Your health care provider will probably prescribe medicines to help you manage pain, recover more quickly, and avoid long-term problems. Medicines may include:  Antiviral drugs.  Anti-inflammatory drugs.  Pain medicines.  Anti-itching medicines (antihistamines). If the area involved is on your face, you may be referred to a specialist, such as an eye doctor (ophthalmologist) or an ear, nose, and throat (ENT) doctor (otolaryngologist) to help you avoid eye problems, chronic pain, or disability. Follow these instructions at home: Medicines  Take over-the-counter and prescription medicines only as told by your health care provider.  Apply an anti-itch cream or numbing cream to the affected area as told by your health care provider. Relieving itching and discomfort   Apply cold, wet cloths (cold compresses) to the area of the rash or blisters as told by your health care provider.  Cool baths can be soothing. Try adding baking soda or dry oatmeal to the water to reduce itching. Do not bathe in hot water. Blister and rash care  Keep your rash covered with a loose bandage (dressing). Wear loose-fitting clothing to help ease the pain of  material rubbing against the rash.  Keep your rash and blisters clean by washing the area with mild soap and cool water as told by your health care provider.  Check your rash every day for signs of infection. Check for: ? More redness, swelling, or pain. ? Fluid or blood. ? Warmth. ? Pus or a bad smell.  Do not scratch your rash or pick at your blisters. To help avoid scratching: ? Keep your  fingernails clean and cut short. ? Wear gloves or mittens while you sleep, if scratching is a problem. General instructions  Rest as told by your health care provider.  Keep all follow-up visits as told by your health care provider. This is important.  Wash your hands often with soap and water. If soap and water are not available, use hand sanitizer. Doing this lowers your chance of getting a bacterial skin infection.  Before your blisters change into scabs, your shingles infection can cause chickenpox in people who have never had it or have never been vaccinated against it. To prevent this from happening, avoid contact with other people, especially: ? Babies. ? Pregnant women. ? Children who have eczema. ? Elderly people who have transplants. ? People who have chronic illnesses, such as cancer or AIDS. Contact a health care provider if:  Your pain is not relieved with prescribed medicines.  Your pain does not get better after the rash heals.  You have signs of infection in the rash area, such as: ? More redness, swelling, or pain around the rash. ? Fluid or blood coming from the rash. ? The rash area feeling warm to the touch. ? Pus or a bad smell coming from the rash. Get help right away if:  The rash is on your face or nose.  You have facial pain, pain around your eye area, or loss of feeling on one side of your face.  You have difficulty seeing.  You have ear pain or have ringing in your ear.  You have a loss of taste.  Your condition gets worse. Summary  Shingles, which is also known as herpes zoster, is an infection that causes a painful skin rash and fluid-filled blisters.  This condition is diagnosed with a skin exam. Skin or fluid samples may be taken from the blisters and examined before the diagnosis is made.  Keep your rash covered with a loose bandage (dressing). Wear loose-fitting clothing to help ease the pain of material rubbing against the  rash.  Before your blisters change into scabs, your shingles infection can cause chickenpox in people who have never had it or have never been vaccinated against it. This information is not intended to replace advice given to you by your health care provider. Make sure you discuss any questions you have with your health care provider. Document Released: 10/19/2005 Document Revised: 06/23/2017 Document Reviewed: 06/23/2017 Elsevier Interactive Patient Education  2019 [employer].

## 2019-01-25 ENCOUNTER — Other Ambulatory Visit: Payer: Self-pay | Admitting: *Deleted

## 2019-01-25 NOTE — Telephone Encounter (Signed)
Please ask her (family) how often she is taking it ?- it is written for tid prn but I hope she does not need to take it that often Her last refill was for 270 pills- that should last her quite a while

## 2019-01-25 NOTE — Telephone Encounter (Signed)
Received fax from [facility] requesting to obtain a prescription for Meclizine to prepare for future refills.  Any prescriptions that are too soon to fill will not be filled or shipped at this time and will be kept on file for the patient until the next available fill date.  Last office visit 01/23/2019 for herpes zoster.  Last refilled 12/07/2018 for #270 with no refills.

## 2019-01-26 NOTE — Telephone Encounter (Signed)
She can contact us when closer to being out-don't think we need to refill at this time  Thanks

## 2019-01-26 NOTE — Telephone Encounter (Signed)
Rx declined  

## 2019-01-26 NOTE — Telephone Encounter (Signed)
Pt has plenty of meds and only takes it as needed. This refill request came from the pharmacy trying to get another refill to store on file since last refill didn't have any additional refills on it, it was #270 tablets with 0 refills filled on 12/07/18

## 2019-01-27 ENCOUNTER — Telehealth: Payer: Self-pay | Admitting: Family Medicine

## 2019-01-27 NOTE — Telephone Encounter (Signed)
Pt's daughter want to know why her mom is taking Valacyclovir because she think it is making her confused and hallucinating. Please advise

## 2019-01-27 NOTE — Telephone Encounter (Signed)
Daughter notified of Dr. Marliss Coots comments. Pt's daughter said that the side eff are bad pt is really confused and hallucinating so she said she will stop the valcyclovir but if sxs don't continue to improve once stopping med she will let us know

## 2019-01-27 NOTE — Telephone Encounter (Signed)
It is a 7 day course of valcyclovir for shingles - it is to lessen symptoms and shorten course  If they think that the side effects are not worth the benefit please stop it  Thanks

## 2019-01-30 ENCOUNTER — Other Ambulatory Visit (INDEPENDENT_AMBULATORY_CARE_PROVIDER_SITE_OTHER): Payer: MEDICARE

## 2019-01-30 ENCOUNTER — Telehealth: Payer: Self-pay | Admitting: *Deleted

## 2019-01-30 ENCOUNTER — Other Ambulatory Visit: Payer: Self-pay

## 2019-01-30 DIAGNOSIS — F0390 Unspecified dementia without behavioral disturbance: Secondary | ICD-10-CM | POA: Diagnosis not present

## 2019-01-30 DIAGNOSIS — N390 Urinary tract infection, site not specified: Secondary | ICD-10-CM | POA: Diagnosis not present

## 2019-01-30 LAB — POC URINALSYSI DIPSTICK (AUTOMATED)
Bilirubin, UA: NEGATIVE
Blood, UA: NEGATIVE
Glucose, UA: NEGATIVE
Ketones, UA: NEGATIVE
Leukocytes, UA: NEGATIVE
Nitrite, UA: NEGATIVE
Protein, UA: NEGATIVE
Spec Grav, UA: 1.02 (ref 1.010–1.025)
Urobilinogen, UA: 0.2 E.U./dL
pH, UA: 7 (ref 5.0–8.0)

## 2019-01-30 NOTE — Telephone Encounter (Signed)
Daughter will bring urine sample due to the severity of her hallucinations and confusion. She told family that her daughter took her to a party and was certain it happened. Daughter will drop off urine sample today

## 2019-01-30 NOTE — Telephone Encounter (Signed)
Pt's daughter stated she is returning your call

## 2019-01-30 NOTE — Telephone Encounter (Signed)
Michelle Gregory stated she did have her phone with you  It didn't ring please call her back

## 2019-01-30 NOTE — Telephone Encounter (Signed)
Pt's daughter is calling and need to speak to nurse concerning pt may have a UTI. Pt is still hallucinating really bad. Please call pt.

## 2019-01-30 NOTE — Telephone Encounter (Signed)
Pt's daughter returned Shapale's call.  She can be reached at [phone].

## 2019-01-30 NOTE — Telephone Encounter (Signed)
This is recurrent for her  They can bring in a sample (either sterilized jar or urine collection cup if they have one)  Thanks

## 2019-01-30 NOTE — Telephone Encounter (Signed)
Routing to triage nurse.

## 2019-01-30 NOTE — Telephone Encounter (Signed)
See other message regarding patient's symptoms. Phone note sent to Dr. Glori Bickers.

## 2019-01-30 NOTE — Telephone Encounter (Signed)
Patient's daughter called stating that her mom is confused and hallucinating. Michelle Gregory stated that her mom has had UTI's in the past, but there is no odor to the urine. Michelle Gregory wanted to know if she can bring a urine specimen to the office to be checked?

## 2019-01-30 NOTE — Telephone Encounter (Signed)
Left VM requesting [name] to call the office back

## 2019-01-30 NOTE — Telephone Encounter (Signed)
Best number [phone] Jill Poling your call

## 2019-01-31 LAB — URINE CULTURE
MICRO NUMBER:: 362365
SPECIMEN QUALITY:: ADEQUATE

## 2019-01-31 NOTE — Telephone Encounter (Signed)
Michelle Gregory notified of normal urinalysis but advised we did send urine for culture. Daughter said she is very concern because pt is still hallucinating really bad. Just this morning daughter went to give pt her meds and pt asked if the party was still going on and daughter asked her what party and she said "the party we just left" pt is admant that daughter is taking her to the large parties no matter what daughter tells her.

## 2019-01-31 NOTE — Telephone Encounter (Signed)
Is she on any new /sedating medications or strong pain medicines?  I suspect that the hallucinating may be part of advanced age dementia (and while it is of course worse with infections, etc) it may also be advancing with age If other physical symptoms/ appetite change/fever etc let me know  Also if anxious or depressed I can schedule labs (we can do in the car) if she wants to check some general chemistries/blood count /thyroid to see if anything else is going on  I have occasionally seen a clear ua with pos culture so I am waiting on that also

## 2019-01-31 NOTE — Telephone Encounter (Signed)
Daughter notified of DR. Tower's comments. She said pt is only taking meds on med list. She said she does have anxiety because her confusion is so bad she doesn't realized that [name] is her daughter, and she keeps saying her daughter abandoned her. Daughter said she will wait for the urine cx to come back tomorrow but if it's clear she would like labs and to figure out what to do because she said she is aware pt has dementia but this confusion/ hallucinating was very sudden and daughter is very worried

## 2019-01-31 NOTE — Telephone Encounter (Signed)
Aware- will make that the plan , call if things change before then

## 2019-02-01 ENCOUNTER — Other Ambulatory Visit: Payer: Self-pay

## 2019-02-01 ENCOUNTER — Other Ambulatory Visit (INDEPENDENT_AMBULATORY_CARE_PROVIDER_SITE_OTHER): Payer: MEDICARE

## 2019-02-01 ENCOUNTER — Telehealth (INDEPENDENT_AMBULATORY_CARE_PROVIDER_SITE_OTHER): Payer: MEDICARE | Admitting: Family Medicine

## 2019-02-01 DIAGNOSIS — R5382 Chronic fatigue, unspecified: Secondary | ICD-10-CM

## 2019-02-01 DIAGNOSIS — M810 Age-related osteoporosis without current pathological fracture: Secondary | ICD-10-CM | POA: Diagnosis not present

## 2019-02-01 DIAGNOSIS — R440 Auditory hallucinations: Secondary | ICD-10-CM

## 2019-02-01 DIAGNOSIS — R7303 Prediabetes: Secondary | ICD-10-CM

## 2019-02-01 DIAGNOSIS — I10 Essential (primary) hypertension: Secondary | ICD-10-CM

## 2019-02-01 DIAGNOSIS — N390 Urinary tract infection, site not specified: Secondary | ICD-10-CM

## 2019-02-01 DIAGNOSIS — D696 Thrombocytopenia, unspecified: Secondary | ICD-10-CM | POA: Diagnosis not present

## 2019-02-01 DIAGNOSIS — R410 Disorientation, unspecified: Secondary | ICD-10-CM | POA: Insufficient documentation

## 2019-02-01 NOTE — Telephone Encounter (Signed)
-----   Message from Tammi Sou, [place] sent at 02/01/2019 10:49 AM EDT ----- Daughter is very worried since urine cx was negative for infection. Daughter would like to do labs this afternoon, lab appt scheduled for today at 3:00pm, please put orders in

## 2019-02-02 ENCOUNTER — Telehealth: Payer: Self-pay | Admitting: *Deleted

## 2019-02-02 LAB — COMPREHENSIVE METABOLIC PANEL
ALT: 10 U/L (ref 0–35)
AST: 16 U/L (ref 0–37)
Albumin: 3.8 g/dL (ref 3.5–5.2)
Alkaline Phosphatase: 58 U/L (ref 39–117)
BUN: 24 mg/dL — ABNORMAL HIGH (ref 6–23)
CO2: 33 mEq/L — ABNORMAL HIGH (ref 19–32)
Calcium: 9.3 mg/dL (ref 8.4–10.5)
Chloride: 101 mEq/L (ref 96–112)
Creatinine, Ser: 0.88 mg/dL (ref 0.40–1.20)
GFR: 59.84 mL/min — ABNORMAL LOW (ref >60.00)
Glucose, Bld: 99 mg/dL (ref 70–99)
Potassium: 4.4 mEq/L (ref 3.5–5.1)
Sodium: 141 mEq/L (ref 135–145)
Total Bilirubin: 0.5 mg/dL (ref 0.2–1.2)
Total Protein: 6 g/dL (ref 6.0–8.3)

## 2019-02-02 LAB — CBC WITH DIFFERENTIAL/PLATELET
Basophils Absolute: 0.1 10*3/uL (ref 0.0–0.1)
Basophils Relative: 1.3 % (ref 0.0–3.0)
Eosinophils Absolute: 0.4 10*3/uL (ref 0.0–0.7)
Eosinophils Relative: 5.3 % — ABNORMAL HIGH (ref 0.0–5.0)
HCT: 40.5 % (ref 36.0–46.0)
Hemoglobin: 13.7 g/dL (ref 12.0–15.0)
Lymphocytes Relative: 20.7 % (ref 12.0–46.0)
Lymphs Abs: 1.6 10*3/uL (ref 0.7–4.0)
MCHC: 33.9 g/dL (ref 30.0–36.0)
MCV: 99.2 fl (ref 78.0–100.0)
Monocytes Absolute: 0.8 10*3/uL (ref 0.1–1.0)
Monocytes Relative: 10 % (ref 3.0–12.0)
Neutro Abs: 4.8 10*3/uL (ref 1.4–7.7)
Neutrophils Relative %: 62.7 % (ref 43.0–77.0)
Platelets: 136 10*3/uL — ABNORMAL LOW (ref 150.0–400.0)
RBC: 4.08 Mil/uL (ref 3.87–5.11)
RDW: 13.7 % (ref 11.5–15.5)
WBC: 7.6 10*3/uL (ref 4.0–10.5)

## 2019-02-02 LAB — VITAMIN B12: Vitamin B-12: 412 pg/mL (ref 211–911)

## 2019-02-02 LAB — TSH: TSH: 1.99 u[IU]/mL (ref 0.35–4.50)

## 2019-02-02 LAB — VITAMIN D 25 HYDROXY (VIT D DEFICIENCY, FRACTURES): VITD: 50.19 ng/mL (ref 30.00–100.00)

## 2019-02-02 LAB — HEMOGLOBIN A1C: Hgb A1c MFr Bld: 5.6 % (ref 4.6–6.5)

## 2019-02-02 NOTE — Telephone Encounter (Signed)
Patient's daughter,[name],returned Shapale's call.  She's very anxious to know the results of her mother's labs.  Please call [name] back today at [phone].

## 2019-02-02 NOTE — Telephone Encounter (Signed)
Left VM requesting pt's daughter to call regarding lab results

## 2019-02-03 NOTE — Telephone Encounter (Signed)
Addressed through result notes  

## 2019-02-07 NOTE — Telephone Encounter (Signed)
Pt's daughter want call back from CMA to discuss her mom.

## 2019-02-07 NOTE — Telephone Encounter (Signed)
Dr. Glori Bickers called pt's daughter directly and discuss all of her concerns with her

## 2019-03-08 DIAGNOSIS — G894 Chronic pain syndrome: Secondary | ICD-10-CM | POA: Diagnosis not present

## 2019-03-08 DIAGNOSIS — M455 Ankylosing spondylitis of thoracolumbar region: Secondary | ICD-10-CM | POA: Diagnosis not present

## 2019-03-08 DIAGNOSIS — M5136 Other intervertebral disc degeneration, lumbar region: Secondary | ICD-10-CM | POA: Diagnosis not present

## 2019-03-19 ENCOUNTER — Encounter (HOSPITAL_BASED_OUTPATIENT_CLINIC_OR_DEPARTMENT_OTHER): Payer: Self-pay | Admitting: Emergency Medicine

## 2019-03-19 ENCOUNTER — Other Ambulatory Visit: Payer: Self-pay

## 2019-03-19 ENCOUNTER — Emergency Department (HOSPITAL_BASED_OUTPATIENT_CLINIC_OR_DEPARTMENT_OTHER)
Admission: EM | Admit: 2019-03-19 | Discharge: 2019-03-19 | Disposition: A | Discharge status: Home / Self Care | Payer: MEDICARE | Attending: Emergency Medicine | Admitting: Emergency Medicine

## 2019-03-19 ENCOUNTER — Emergency Department (HOSPITAL_BASED_OUTPATIENT_CLINIC_OR_DEPARTMENT_OTHER): Payer: MEDICARE

## 2019-03-19 DIAGNOSIS — N390 Urinary tract infection, site not specified: Secondary | ICD-10-CM | POA: Insufficient documentation

## 2019-03-19 DIAGNOSIS — I10 Essential (primary) hypertension: Secondary | ICD-10-CM | POA: Diagnosis not present

## 2019-03-19 DIAGNOSIS — Z79899 Other long term (current) drug therapy: Secondary | ICD-10-CM | POA: Diagnosis not present

## 2019-03-19 DIAGNOSIS — R4182 Altered mental status, unspecified: Secondary | ICD-10-CM | POA: Diagnosis not present

## 2019-03-19 DIAGNOSIS — R410 Disorientation, unspecified: Secondary | ICD-10-CM | POA: Diagnosis not present

## 2019-03-19 DIAGNOSIS — F0390 Unspecified dementia without behavioral disturbance: Secondary | ICD-10-CM | POA: Insufficient documentation

## 2019-03-19 DIAGNOSIS — R4020 Unspecified coma: Secondary | ICD-10-CM | POA: Diagnosis not present

## 2019-03-19 LAB — COMPREHENSIVE METABOLIC PANEL
ALT: 17 U/L (ref 0–44)
AST: 39 U/L (ref 15–41)
Albumin: 3.6 g/dL (ref 3.5–5.0)
Alkaline Phosphatase: 54 U/L (ref 38–126)
Anion gap: 8 (ref 5–15)
BUN: 40 mg/dL — ABNORMAL HIGH (ref 8–23)
CO2: 29 mmol/L (ref 22–32)
Calcium: 8.9 mg/dL (ref 8.9–10.3)
Chloride: 100 mmol/L (ref 98–111)
Creatinine, Ser: 1.14 mg/dL — ABNORMAL HIGH (ref 0.44–1.00)
GFR calc Af Amer: 48 mL/min — ABNORMAL LOW (ref >60)
GFR calc non Af Amer: 41 mL/min — ABNORMAL LOW (ref >60)
Glucose, Bld: 105 mg/dL — ABNORMAL HIGH (ref 70–99)
Potassium: 4.7 mmol/L (ref 3.5–5.1)
Sodium: 137 mmol/L (ref 135–145)
Total Bilirubin: 0.9 mg/dL (ref 0.3–1.2)
Total Protein: 6.2 g/dL — ABNORMAL LOW (ref 6.5–8.1)

## 2019-03-19 LAB — URINALYSIS, MICROSCOPIC (REFLEX)
RBC / HPF: NONE SEEN RBC/hpf (ref 0–5)
Squamous Epithelial / HPF: NONE SEEN (ref 0–5)

## 2019-03-19 LAB — URINALYSIS, ROUTINE W REFLEX MICROSCOPIC
Bilirubin Urine: NEGATIVE
Glucose, UA: NEGATIVE mg/dL
Hgb urine dipstick: NEGATIVE
Ketones, ur: NEGATIVE mg/dL
Nitrite: POSITIVE — AB
Protein, ur: NEGATIVE mg/dL
Specific Gravity, Urine: 1.02 (ref 1.005–1.030)
pH: 5.5 (ref 5.0–8.0)

## 2019-03-19 LAB — CBC WITH DIFFERENTIAL/PLATELET
Abs Immature Granulocytes: 0.02 10*3/uL (ref 0.00–0.07)
Basophils Absolute: 0 10*3/uL (ref 0.0–0.1)
Basophils Relative: 0 %
Eosinophils Absolute: 0.3 10*3/uL (ref 0.0–0.5)
Eosinophils Relative: 4 %
HCT: 38.2 % (ref 36.0–46.0)
Hemoglobin: 12.3 g/dL (ref 12.0–15.0)
Immature Granulocytes: 0 %
Lymphocytes Relative: 16 %
Lymphs Abs: 1.4 10*3/uL (ref 0.7–4.0)
MCH: 33 pg (ref 26.0–34.0)
MCHC: 32.2 g/dL (ref 30.0–36.0)
MCV: 102.4 fL — ABNORMAL HIGH (ref 80.0–100.0)
Monocytes Absolute: 0.8 10*3/uL (ref 0.1–1.0)
Monocytes Relative: 9 %
Neutro Abs: 6.4 10*3/uL (ref 1.7–7.7)
Neutrophils Relative %: 71 %
Platelets: 126 10*3/uL — ABNORMAL LOW (ref 150–400)
RBC: 3.73 MIL/uL — ABNORMAL LOW (ref 3.87–5.11)
RDW: 12.9 % (ref 11.5–15.5)
WBC: 9 10*3/uL (ref 4.0–10.5)
nRBC: 0 % (ref 0.0–0.2)

## 2019-03-19 LAB — LIPASE, BLOOD: Lipase: 22 U/L (ref 11–51)

## 2019-03-19 MED ORDER — SODIUM CHLORIDE 0.9 % IV SOLN
1.0000 g | Freq: Once | INTRAVENOUS | Status: AC
  Administered 2019-03-19: 13:00:00 1 g via INTRAVENOUS
  Filled 2019-03-19: qty 10

## 2019-03-19 MED ORDER — SODIUM CHLORIDE 0.9 % IV BOLUS
1000.0000 mL | Freq: Once | INTRAVENOUS | Status: AC
  Administered 2019-03-19: 1000 mL via INTRAVENOUS

## 2019-03-19 MED ORDER — CEPHALEXIN 500 MG PO CAPS: 500.0000 mg | ORAL_CAPSULE | Freq: Two times a day (BID) | ORAL | 0 refills | Status: DC

## 2019-03-19 NOTE — ED Triage Notes (Signed)
Pt daughter pt has been confused and hallucinating since Friday. She fell within the last week and did hit her head. Pt c/o back pain. She does have chronic back pain.

## 2019-03-19 NOTE — ED Notes (Signed)
D/c with family. Rx x 1 given

## 2019-03-19 NOTE — ED Provider Notes (Signed)
[facility] EMERGENCY DEPARTMENT Provider Note   CSN: [id number] Arrival date & time: 03/19/19  1126    History   Chief Complaint Chief Complaint  Patient presents with   Altered Mental Status    HPI INGE WALDROUP is a 85 y.o. female.     The history is provided by the patient and a caregiver.  Altered Mental Status  Presenting symptoms: confusion   Severity:  Mild Most recent episode:  2 days ago Timing:  Intermittent Progression:  Waxing and waning Chronicity:  New Context: not dementia   Context comment:  Patient hit her head 3 days ago while walking without her walker and has had some irritability and confusion since. Had a bad night sleeping. No headaches. Family concerned for UTI.  Associated symptoms: decreased appetite   Associated symptoms: no abdominal pain, no fever, no headaches, no light-headedness, no palpitations, no rash, no seizures, no vomiting and no weakness     Past Medical History:  Diagnosis Date   Allergic rhinitis    Anxiety    C. difficile diarrhea 10/2017   Cancer ([place])    Depression    GERD (gastroesophageal reflux disease)    History of colon cancer    History of recurrent UTIs    Hyperlipidemia    Hypertension    Osteoarthritis    Spinal compression fracture [facility])    Vertigo     Patient Active Problem List   Diagnosis Date Noted   Delirium 02/01/2019   Shingles 01/23/2019   Right wrist pain 12/06/2018   Laceration of wrist, right 12/06/2018   Fracture, ulna, distal 12/06/2018   Thrombocytopenia ([place]) 12/06/2018   Dementia, senile ([place]) 11/22/2018   Hand injury, left, initial encounter 09/01/2018   H/O falling 05/03/2018   Episodic confusion 05/03/2018   Sacral fracture ([place]) 04/03/2018   Fall at home 03/29/2018   Anxiety disorder 03/29/2018   Fatigue 03/29/2018   Chronic pain 02/09/2018   Cerumen impaction 12/06/2017   H/O Clostridium difficile infection 10/13/2017   Recurrent  UTI (urinary tract infection) 09/06/2017   Auditory hallucinations 03/03/2017   Female cystocele 04/13/2016   Bowel habit changes 12/30/2015   Compression fracture 12/30/2015   Routine general medical examination at a health care facility 04/09/2015   UTI (urinary tract infection) 05/09/2014   Candidal intertrigo 03/28/2014   Encounter for Medicare annual wellness exam 03/21/2013   Gout 09/19/2012   Age related osteoporosis 05/05/2011   Degenerative lumbar disc 01/20/2011   VARICOSE VEINS, LOWER EXTREMITIES 09/04/2010   Prediabetes 01/08/2010   Hyperlipidemia 05/09/2008   Essential hypertension 05/09/2008   HEMORRHOIDS, INTERNAL 05/09/2008   Allergic rhinitis 05/09/2008   Mild reactive airways disease 05/09/2008   GERD 05/09/2008   IBS 05/09/2008   Osteoarthritis 05/09/2008   History of malignant neoplasm of large intestine 05/09/2008    Past Surgical History:  Procedure Laterality Date   APPENDECTOMY     CATARACT EXTRACTION     COLON RESECTION     TONSILLECTOMY       OB History   No obstetric history on file.      Home Medications    Prior to Admission medications   Medication Sig Start Date End Date Taking? Authorizing Provider  allopurinol (ZYLOPRIM) 100 MG tablet Take 2 tablets (200 mg total) by mouth daily. 01/23/19   Tower, [name], MD  ALPRAZolam Duanne Moron) 0.5 MG tablet 1/2 pill by mouth up to twice daily as needed for anxiety 04/07/18   Velvet Bathe, MD  Ascorbic Acid (VITAMIN C) 100 MG tablet Take 100 mg by mouth daily.    [provider]  aspirin 81 MG tablet Take 81 mg by mouth daily.      [provider]  calcium-vitamin D (CALCIUM 500 +D) 500 MG tablet Take 1 tablet by mouth daily.     [provider]  cephALEXin (KEFLEX) 500 MG capsule Take 1 capsule (500 mg total) by mouth 2 (two) times daily for 10 days. 03/19/19 03/29/19  Ismael Karge, DO  CRANBERRY PO Take 1 capsule by mouth 2 (two) times daily.     [provider]  gabapentin (NEURONTIN) 300 MG capsule Take 300 mg by mouth 2 (two) times daily.  09/06/17   [provider]  glucosamine-chondroitin 500-400 MG tablet Take 1 tablet by mouth daily.    [provider]  HYDROcodone-acetaminophen (NORCO/VICODIN) 5-325 MG tablet Take 1 tablet by mouth every 4 (four) hours as needed for moderate pain. 04/07/18   Velvet Bathe, MD  losartan (COZAAR) 100 MG tablet Take 1 tablet (100 mg total) by mouth daily. 01/23/19   Tower, [name], MD  meclizine (ANTIVERT) 25 MG tablet Take 1 tablet (25 mg total) by mouth 3 (three) times daily as needed for dizziness. 12/07/18   Tower, [name], MD  metoprolol tartrate (LOPRESSOR) 50 MG tablet Take 1 tablet (50 mg total) by mouth 2 (two) times daily. 01/23/19   Tower, [name], MD  multivitamin [facility]) per tablet Take 1 tablet by mouth daily.      [provider]  PARoxetine (PAXIL) 30 MG tablet Take 1 tablet (30 mg total) by mouth every morning. 01/23/19   Tower, [name], MD  Probiotic Product (PROBIOTIC DAILY PO) Take 1 tablet by mouth daily.    [provider]  raloxifene (EVISTA) 60 MG tablet Take 1 tablet (60 mg total) by mouth daily. 01/23/19   Tower, [name], MD  valACYclovir (VALTREX) 1000 MG tablet Take 1 tablet (1,000 mg total) by mouth 3 (three) times daily. 01/23/19   Tower, [name], MD  Glucosamine-Chondroitin 1500-1200 MG/30ML LIQD Take by mouth. As dierected   11/02/19  [provider]    Family History Family History  Problem Relation Age of Onset   Prostate cancer Father    Heart attack Mother    Gout Mother     Social History Social History   Tobacco Use   Smoking status: Never Smoker   Smokeless tobacco: Never Used  Substance Use Topics   Alcohol use: No    Alcohol/week: 0.0 standard drinks   Drug use: No     Allergies   Amoxicillin-pot clavulanate; Keflex [cephalexin]; and Septra [sulfamethoxazole-trimethoprim]   Review of  Systems Review of Systems  Constitutional: Positive for decreased appetite. Negative for chills and fever.  HENT: Negative for ear pain and sore throat.   Eyes: Negative for pain and visual disturbance.  Respiratory: Negative for cough and shortness of breath.   Cardiovascular: Negative for chest pain and palpitations.  Gastrointestinal: Negative for abdominal pain and vomiting.  Genitourinary: Negative for dysuria and hematuria.  Musculoskeletal: Negative for arthralgias and back pain.  Skin: Positive for color change (bruise to left side of head). Negative for rash.  Neurological: Negative for dizziness, tremors, seizures, syncope, facial asymmetry, speech difficulty, weakness, light-headedness, numbness and headaches.  Psychiatric/Behavioral: Positive for confusion.  All other systems reviewed and are negative.    Physical Exam Updated Vital Signs  ED Triage Vitals  Enc Vitals Group  BP 03/19/19 1135 136/64     Pulse Rate 03/19/19 1135 79     Resp 03/19/19 1137 18     Temp 03/19/19 1135 98.4 F (36.9 C)     Temp Source 03/19/19 1135 Oral     SpO2 03/19/19 1135 (!) 88 %     Weight --      Height --      Head Circumference --      Peak Flow --      Pain Score --      Pain Loc --      Pain Edu? --      Excl. in [place]? --     Physical Exam Vitals signs and nursing note reviewed.  Constitutional:      General: She is not in acute distress.    Appearance: She is well-developed.  HENT:     Head: Normocephalic.     Nose: Nose normal.     Mouth/Throat:     Mouth: Mucous membranes are moist.  Eyes:     Extraocular Movements: Extraocular movements intact.     Conjunctiva/sclera: Conjunctivae normal.     Pupils: Pupils are equal, round, and reactive to light.  Neck:     Musculoskeletal: Normal range of motion and neck supple.  Cardiovascular:     Rate and Rhythm: Normal rate and regular rhythm.     Pulses: Normal pulses.     Heart sounds: Normal heart sounds. No  murmur.  Pulmonary:     Effort: Pulmonary effort is normal. No respiratory distress.     Breath sounds: Normal breath sounds.  Abdominal:     Palpations: Abdomen is soft.     Tenderness: There is no abdominal tenderness.  Musculoskeletal: Normal range of motion.  Skin:    General: Skin is warm and dry.     Findings: Bruising (to left side of forhead) present.  Neurological:     General: No focal deficit present.     Mental Status: She is alert.     Cranial Nerves: No cranial nerve deficit.     Sensory: No sensory deficit.     Motor: No weakness.     Coordination: Coordination normal.     Comments: 5+ out of 5 strength throughout, normal sensation, no drift, normal finger-to-nose finger, patient is alert and oriented      ED Treatments / Results  Labs (all labs ordered are listed, but only abnormal results are displayed) Labs Reviewed  CBC WITH DIFFERENTIAL/PLATELET - Abnormal; Notable for the following components:      Result Value   RBC 3.73 (*)    MCV 102.4 (*)    Platelets 126 (*)    All other components within normal limits  COMPREHENSIVE METABOLIC PANEL - Abnormal; Notable for the following components:   Glucose, Bld 105 (*)    BUN 40 (*)    Creatinine, Ser 1.14 (*)    Total Protein 6.2 (*)    GFR calc non Af Amer 41 (*)    GFR calc Af Amer 48 (*)    All other components within normal limits  URINALYSIS, ROUTINE W REFLEX MICROSCOPIC - Abnormal; Notable for the following components:   APPearance CLOUDY (*)    Nitrite POSITIVE (*)    Leukocytes,Ua TRACE (*)    All other components within normal limits  URINALYSIS, MICROSCOPIC (REFLEX) - Abnormal; Notable for the following components:   Bacteria, UA MANY (*)    All other components within normal limits  URINE  CULTURE  LIPASE, BLOOD    EKG EKG Interpretation  Date/Time:  Sunday Mar 19 2019 11:39:34 EDT Ventricular Rate:  79 PR Interval:    QRS Duration: 77 QT Interval:  357 QTC Calculation: 410 R  Axis:   37 Text Interpretation:  Sinus rhythm Borderline low voltage, extremity leads Confirmed by Lennice Sites [id number]) on 03/19/2019 11:54:12 AM Also confirmed by Lennice Sites [phone]), editor Philomena Doheny [phone])  on 03/19/2019 11:58:56 AM   Radiology No results found.  Procedures Procedures (including critical care time)  Medications Ordered in ED Medications  cefTRIAXone (ROCEPHIN) 1 g in sodium chloride 0.9 % 100 mL IVPB (has no administration in time range)  sodium chloride 0.9 % bolus 1,000 mL (1,000 mLs Intravenous New Bag/Given 03/19/19 1157)     Initial Impression / Assessment and Plan / ED Course  I have reviewed the triage vital signs and the nursing notes.  Pertinent labs & imaging results that were available during my care of the patient were reviewed by me and considered in my medical decision making (see chart for details).     CANDA PODGORSKI is a 85 year old female with history of anxiety, high cholesterol, hypertension, possible dementia who presents to the ED with altered mental status.  Family concern for UTI.  Patient with normal vitals.  No fever.  Triage vitals show hypoxia but throughout my evaluation patient with normal oxygenation on room air.  Patient had mechanical fall about 3 days ago and has bruising to the left side of her head.  Normal neurological exam.  Family states that she has been more confused than normal since that fall.  Patient has had poor sleep and appears to be talking to herself at night.  Patient overall appears alert and pleasant on exam.  Is able to answer questions.  She knows her name and where she is.  She knows her daughter's name.  Patient has no fever.  Will evaluate for cause of change in mental status although appears that patient is likely at her baseline.  Could be dementia related.  Could be concussion from recent fall.  Will obtain CT of the head to rule out intracranial injury.  Will check urinalysis, chest x-ray, basic labs.   Patient has not been eating or drinking and could be dehydrated.  Will give normal saline bolus and reevaluate. No concern for stroke.  Patient with no leukocytosis.  Creatinine mildly elevated at 1.14.  Otherwise no significant electrolyte abnormality, anemia.  Chest x-ray with no signs of infection.  CT of the head without any signs of injury and no acute findings.  Urinalysis is positive for infection.  Patient felt better after IV fluids.  Normal vital signs.  Shared decision was made with family and they prefer a trial of outpatient antibiotics given that she is had improvement with fluids.  They do not want admission at this time and believe that is reasonable given no concerns for sepsis at this time.  Patient is eating [employer] upon my reevaluation.  Patient was given first dose of IV antibiotics here with Rocephin.  Will treat with Keflex.  Urine culture has been sent.  Patient will follow-up with primary care doctor and told to return to the ED if symptoms worsen.  This chart was dictated using voice recognition software.  Despite best efforts to proofread,  errors can occur which can change the documentation meaning.    Final Clinical Impressions(s) / ED Diagnoses   Final diagnoses:  Lower urinary tract  infectious disease    ED Discharge Orders         Ordered    cephALEXin (KEFLEX) 500 MG capsule  2 times daily     03/19/19 [place], [place], DO 03/19/19 1322

## 2019-03-19 NOTE — ED Notes (Signed)
Bruising left parietal area, superficial abrasion to area as well, pt and daughter report fall 2 days ago.  Bruising left shoulder, left knee bruising and abrasion.

## 2019-03-20 ENCOUNTER — Other Ambulatory Visit: Payer: Self-pay | Admitting: Family Medicine

## 2019-03-20 ENCOUNTER — Telehealth: Payer: Self-pay

## 2019-03-20 DIAGNOSIS — F411 Generalized anxiety disorder: Secondary | ICD-10-CM

## 2019-03-20 MED ORDER — NITROFURANTOIN MONOHYD MACRO 100 MG PO CAPS: 100.0000 mg | ORAL_CAPSULE | Freq: Two times a day (BID) | ORAL | 0 refills | Status: DC

## 2019-03-20 MED ORDER — ALPRAZOLAM 0.5 MG PO TABS: ORAL_TABLET | ORAL | 0 refills | Status: DC

## 2019-03-20 MED ORDER — ALPRAZOLAM 0.5 MG PO TABS: 0.2500 mg | ORAL_TABLET | Freq: Every evening | ORAL | 0 refills | Status: DC | PRN

## 2019-03-20 NOTE — Telephone Encounter (Signed)
Daughter notified of Dr. Marliss Coots instructions and recommendations and verbalized understanding. Daughter notified Rxs sent and advised to keep [place] posted

## 2019-03-20 NOTE — Telephone Encounter (Signed)
I pended macrobid to send-please ask if she has ever had problems with it before sending- and let me know if there are any issues/send it if not  We may know more when culture returns  Xanax would be (for now) what I recommend for sleep Give 1/2 at bedtime (a whole is ok if needed) and watch closely for sedation of falls  Let me know how this works

## 2019-03-20 NOTE — Telephone Encounter (Signed)
Please refer to 2nd phone encounter for today (03/20/19) detailing this call and PCP's response and guidance.

## 2019-03-20 NOTE — Telephone Encounter (Signed)
They gave her keflex which is generally a good choice but I see it gave her diarrhea in the past.  How is she doing with it and how is she doing in general?

## 2019-03-20 NOTE — Telephone Encounter (Signed)
Before I send in abx, pt need refill of xanax if you are okay with her using it

## 2019-03-20 NOTE — Telephone Encounter (Signed)
Answering service called before 9:00. Pt's daughter called because Michelle Gregory was seen in ER. She's requesting a call back to discuss antibiotic given to patient. She wants to make sure it's okay for patient to take. Can reach [employer] at [phone]

## 2019-03-20 NOTE — Telephone Encounter (Signed)
Spoke with Daughter [name] and advised of Dr. Marliss Coots comments. Daughter hasn't gotten abx filled yet because she was given an IV abx in the ER and they told her to start the oral abx today . Daughter wanted to ask if Dr. Glori Bickers thinks she needs to change abx because [name] is very concerned about the abx giving pt diarrhea. Pt is very difficult to handle right now and if she has diarrhea right now it would be to hard on daughter. Daughter said that pt is very confused and hallucinating. She also isn't sleeping so daughter gave her 1/2 xanax at 2:30am when pt woke up confused. Pt slept from 2:30am till 6:00am and then woke up used the bathroom and asked her family "what she needed to do for the day" they advised pt she just needed to go back to sleep and pt did she slept for about 2 more hrs and woke up at 8:00am today even more confused. Pt said that her family needs to "get the kids out of her room".  Daughter has a few questions for Dr. Glori Bickers given situation:  1: if Dr. Glori Bickers is okay with changing abx to one pt can tolerate because the diarrhea from the keflex will be to much on top of pt's confusion and hallucinations   2: if there is a medication that Dr. Glori Bickers can prescribed to help her sleep, she is very confused and hasn't been sleeping well. Daughter gave her a 1/2 xanax but she want's something she can given her on a regular basis if they needed it and she knows xanax may not be the best option for pt to help with sleep.  [facility]

## 2019-03-20 NOTE — Telephone Encounter (Signed)
[facility] Call Center Patient Name: ANJANETTE GILKEY Gender: Female DOB: 23-Mar-1923 Age: 85 Y 22 D Return Phone Number: [phone] (Primary), [phone] (Secondary) Address: City/State/Zip: [place] [address] Client [facility] Night - Client Client Site [place] Physician Tower, Roque Lias - MD Contact Type Call Who Is Calling Patient / Member / Family / Caregiver Call Type Triage / Clinical Caller Name [name] Relationship To Patient Daughter Return Phone Number [phone] (Primary) Chief Complaint Hallucinations Reason for Call Symptomatic / Request for [place] states she is calling on behalf of [place]. She had to take her to the ER over the weekend for a UTI. She got antibiotics and would like to know if those are safe to take and that DR. Tower has approved of them. She is also is needing something for her sleep. She has not been sleeping in the last three nights. She is hallucinating. She is just walking up and down and is unusual. Translation No Nurse Assessment Nurse: Laurance Flatten, RN, Geni Bers Date/Time (Eastern Time): 03/20/2019 7:53:36 AM Confirm and document reason for call. If symptomatic, describe symptoms. ---Caller stated her mother had to be taken her to the ER over the weekend for a UTI. She got antibiotics but she is wants to speak with the doctor to make sure the medication (Keflex) is ok for her to take. She also was given Rocephin IV while in the ED. Caller stated that she is also not sleeping well and wanted to know if he could prescribe something for sleep. No fever at present. Has the patient had close contact with a person known or suspected to have the novel coronavirus illness OR traveled / lives in area with major community spread (including international travel) in the last 14 days from the  onset of symptoms? * If Asymptomatic, screen for exposure and travel within the last 14 days. ---Not Applicable Does the patient have any new or worsening symptoms? ---No Please document clinical information provided and list any resource used. ---Called backline number and informed them that the caller would like to speak to someone in the office regarding her mother. Was informed that they will call her right back, verified contact information. Informed caller that someone will return her call. Caller verbalized understanding. Guidelines Guideline Title Affirmed Question Affirmed Notes Nurse Date/Time (Eastern Time) PLEASE NOTE: All timestamps contained within this report are represented as [place] Standard Time. CONFIDENTIALTY NOTICE: This fax transmission is intended only for the addressee. It contains information that is legally privileged, confidential or otherwise protected from use or disclosure. If you are not the intended recipient, you are strictly prohibited from reviewing, disclosing, copying using or disseminating any of this information or taking any action in reliance on or regarding this information. If you have received this fax in error, please notify us immediately by telephone so that we can arrange for its return to [place]. Phone: [phone], Toll-Free: [phone], Fax: [phone] Page: 2 of 2 Call Id: [id number] [place]. Time [name] Time) Disposition Final User 03/20/2019 8:03:35 AM Clinical Call Yes Laurance Flatten, RN, Geni Bers

## 2019-03-20 NOTE — Telephone Encounter (Signed)
I sent them thanks

## 2019-03-21 ENCOUNTER — Telehealth: Payer: Self-pay | Admitting: Family Medicine

## 2019-03-21 LAB — URINE CULTURE: Culture: >=100000 — AB

## 2019-03-21 NOTE — Telephone Encounter (Signed)
Pt's daughter notified of Dr. Marliss Coots response and verbalized understanding. She will give it more time but she will update [place] if pt doesn't improve

## 2019-03-21 NOTE — Telephone Encounter (Signed)
Michelle Gregory Pt daughter called [facility] number [phone]  Michelle Gregory wanted to know if she should be concerned pt is sleeping all the time.  She almost went to sleep eating today. The med is working the confusion is better today than yesterday

## 2019-03-21 NOTE — Telephone Encounter (Signed)
At her age this is not entirely unusual.  In addition to that if she has uti (fighting infection) that could be wearing her out as could the antibiotic Please keep me posted however  Thanks

## 2019-03-22 ENCOUNTER — Telehealth: Payer: Self-pay | Admitting: *Deleted

## 2019-03-22 NOTE — Telephone Encounter (Signed)
Post ED Visit - Positive Culture Follow-up  Culture report reviewed by antimicrobial stewardship pharmacist: [place] Team []  Elenor Quinones, Pharm.D. []  Heide Guile, Pharm.D., BCPS AQ-ID []  Parks Neptune, Pharm.D., BCPS []  Alycia Rossetti, Pharm.D., BCPS []  [place], Pharm.D., BCPS, AAHIVP []  Legrand Como, Pharm.D., BCPS, AAHIVP [x]  Salome Arnt, PharmD, BCPS []  Johnnette Gourd, PharmD, BCPS []  Hughes Better, PharmD, BCPS []  Leeroy Cha, PharmD []  Laqueta Linden, PharmD, BCPS []  Albertina Parr, PharmD  [place] Team []  Leodis Sias, PharmD []  Lindell Spar, PharmD []  Royetta Asal, PharmD []  Graylin Shiver, Rph []  Rema Fendt) Glennon Mac, PharmD []  Arlyn Dunning, PharmD []  Netta Cedars, PharmD []  Dia Sitter, PharmD []  Leone Haven, PharmD []  Gretta Arab, PharmD []  Theodis Shove, PharmD []  Peggyann Juba, PharmD []  Reuel Boom, PharmD   Positive urine culture Treated with Cephalexin, organism sensitive to the same and no further patient follow-up is required at this time.  Harlon Flor [facility] 03/22/2019, 9:45 AM

## 2019-03-24 ENCOUNTER — Other Ambulatory Visit: Payer: MEDICARE

## 2019-03-24 ENCOUNTER — Other Ambulatory Visit: Payer: Self-pay

## 2019-03-24 ENCOUNTER — Telehealth: Payer: Self-pay | Admitting: Family Medicine

## 2019-03-24 ENCOUNTER — Other Ambulatory Visit: Payer: Self-pay | Admitting: Family Medicine

## 2019-03-24 DIAGNOSIS — R197 Diarrhea, unspecified: Secondary | ICD-10-CM | POA: Insufficient documentation

## 2019-03-24 NOTE — Telephone Encounter (Signed)
Daughter Michelle Gregory notified of Dr. Marliss Coots comments. Daughter said stool isn't all water but it is very loose. She started with the diarrhea right after lunch yesterday and had 4 episodes of diarrhea, and then when she recently woke up she had diarrhea once again today. Daughter said she hasn't had any other sxs and no fever. Daughter said in general pt's confusion is worsening she is confused most of the day but she isn't hallucinating anymore. Daughter said the fatigue is better they have been giving her 1/2 tab of xanax before bedtime and she will sleep through the night now so the day time fatigue is better. Daughter Michelle Gregory said the only reason she called and is concern is that pt has had C-diff in the past after abx use so she was just concern that she could be the cause of the diarrhea

## 2019-03-24 NOTE — Telephone Encounter (Signed)
[facility] number [phone]  [name] called stating pt has had diarrhea that started yesterday afternoon and she wanted to know what she needed to do

## 2019-03-24 NOTE — Telephone Encounter (Signed)
Order is in  Will cc to [employer]

## 2019-03-24 NOTE — Telephone Encounter (Signed)
Any/all antibiotics have the potential for causing diarrhea/so that is always possible.  How bad is it (watery?) how frequent?  Any fever? Any other symptoms ? How is she feeling in general (right now on macrobid for uti- I think day 4)

## 2019-03-24 NOTE — Telephone Encounter (Signed)
Daughter notified of Dr. Marliss Coots comments. She will try and get stool test back today, please put order in

## 2019-03-24 NOTE — Telephone Encounter (Signed)
I would like to order c diff test- can she come by and pick up stool container and get it potentially back by the end of the day (4:00) ?

## 2019-03-25 LAB — C. DIFFICILE GDH AND TOXIN A/B
GDH ANTIGEN: NOT DETECTED
MICRO NUMBER:: 500424
SPECIMEN QUALITY:: ADEQUATE
TOXIN A AND B: NOT DETECTED

## 2019-03-30 ENCOUNTER — Other Ambulatory Visit: Payer: MEDICARE

## 2019-03-30 ENCOUNTER — Other Ambulatory Visit: Payer: Self-pay

## 2019-03-30 DIAGNOSIS — N390 Urinary tract infection, site not specified: Secondary | ICD-10-CM

## 2019-03-31 LAB — URINE CULTURE
MICRO NUMBER:: 514632
SPECIMEN QUALITY:: ADEQUATE

## 2019-06-01 ENCOUNTER — Other Ambulatory Visit: Payer: Self-pay

## 2019-06-21 ENCOUNTER — Emergency Department (HOSPITAL_COMMUNITY): Payer: MEDICARE

## 2019-06-21 ENCOUNTER — Encounter (HOSPITAL_COMMUNITY): Payer: Self-pay | Admitting: Student

## 2019-06-21 ENCOUNTER — Inpatient Hospital Stay (HOSPITAL_COMMUNITY)
Admission: EM | Admit: 2019-06-21 | Discharge: 2019-06-27 | DRG: 481 | Disposition: A | Discharge status: Rehabilitation Facility | Payer: MEDICARE | Attending: Internal Medicine | Admitting: Internal Medicine

## 2019-06-21 DIAGNOSIS — B9620 Unspecified Escherichia coli [E. coli] as the cause of diseases classified elsewhere: Secondary | ICD-10-CM | POA: Diagnosis not present

## 2019-06-21 DIAGNOSIS — R52 Pain, unspecified: Secondary | ICD-10-CM | POA: Diagnosis not present

## 2019-06-21 DIAGNOSIS — Z20828 Contact with and (suspected) exposure to other viral communicable diseases: Secondary | ICD-10-CM | POA: Diagnosis present

## 2019-06-21 DIAGNOSIS — Z01810 Encounter for preprocedural cardiovascular examination: Secondary | ICD-10-CM

## 2019-06-21 DIAGNOSIS — Z881 Allergy status to other antibiotic agents status: Secondary | ICD-10-CM

## 2019-06-21 DIAGNOSIS — J309 Allergic rhinitis, unspecified: Secondary | ICD-10-CM | POA: Diagnosis present

## 2019-06-21 DIAGNOSIS — T84021A Dislocation of internal left hip prosthesis, initial encounter: Secondary | ICD-10-CM | POA: Diagnosis present

## 2019-06-21 DIAGNOSIS — S72142A Displaced intertrochanteric fracture of left femur, initial encounter for closed fracture: Principal | ICD-10-CM

## 2019-06-21 DIAGNOSIS — F0281 Dementia in other diseases classified elsewhere with behavioral disturbance: Secondary | ICD-10-CM | POA: Diagnosis not present

## 2019-06-21 DIAGNOSIS — R000 Tachycardia, unspecified: Secondary | ICD-10-CM | POA: Diagnosis not present

## 2019-06-21 DIAGNOSIS — G3109 Other frontotemporal dementia: Secondary | ICD-10-CM | POA: Diagnosis not present

## 2019-06-21 DIAGNOSIS — Y92230 Patient room in hospital as the place of occurrence of the external cause: Secondary | ICD-10-CM | POA: Diagnosis not present

## 2019-06-21 DIAGNOSIS — R296 Repeated falls: Secondary | ICD-10-CM | POA: Diagnosis present

## 2019-06-21 DIAGNOSIS — M109 Gout, unspecified: Secondary | ICD-10-CM | POA: Diagnosis present

## 2019-06-21 DIAGNOSIS — K219 Gastro-esophageal reflux disease without esophagitis: Secondary | ICD-10-CM | POA: Diagnosis present

## 2019-06-21 DIAGNOSIS — F0392 Unspecified dementia, unspecified severity, with psychotic disturbance: Secondary | ICD-10-CM | POA: Diagnosis not present

## 2019-06-21 DIAGNOSIS — I10 Essential (primary) hypertension: Secondary | ICD-10-CM | POA: Diagnosis present

## 2019-06-21 DIAGNOSIS — Z9849 Cataract extraction status, unspecified eye: Secondary | ICD-10-CM

## 2019-06-21 DIAGNOSIS — F0390 Unspecified dementia without behavioral disturbance: Secondary | ICD-10-CM | POA: Diagnosis not present

## 2019-06-21 DIAGNOSIS — Z79899 Other long term (current) drug therapy: Secondary | ICD-10-CM

## 2019-06-21 DIAGNOSIS — D696 Thrombocytopenia, unspecified: Secondary | ICD-10-CM | POA: Diagnosis present

## 2019-06-21 DIAGNOSIS — R42 Dizziness and giddiness: Secondary | ICD-10-CM | POA: Diagnosis present

## 2019-06-21 DIAGNOSIS — F329 Major depressive disorder, single episode, unspecified: Secondary | ICD-10-CM | POA: Diagnosis present

## 2019-06-21 DIAGNOSIS — D62 Acute posthemorrhagic anemia: Secondary | ICD-10-CM | POA: Diagnosis not present

## 2019-06-21 DIAGNOSIS — R410 Disorientation, unspecified: Secondary | ICD-10-CM

## 2019-06-21 DIAGNOSIS — S3992XA Unspecified injury of lower back, initial encounter: Secondary | ICD-10-CM | POA: Diagnosis not present

## 2019-06-21 DIAGNOSIS — W1830XD Fall on same level, unspecified, subsequent encounter: Secondary | ICD-10-CM | POA: Diagnosis not present

## 2019-06-21 DIAGNOSIS — G8929 Other chronic pain: Secondary | ICD-10-CM | POA: Diagnosis present

## 2019-06-21 DIAGNOSIS — M549 Dorsalgia, unspecified: Secondary | ICD-10-CM | POA: Diagnosis not present

## 2019-06-21 DIAGNOSIS — I959 Hypotension, unspecified: Secondary | ICD-10-CM | POA: Diagnosis not present

## 2019-06-21 DIAGNOSIS — W19XXXA Unspecified fall, initial encounter: Secondary | ICD-10-CM | POA: Diagnosis not present

## 2019-06-21 DIAGNOSIS — F419 Anxiety disorder, unspecified: Secondary | ICD-10-CM | POA: Diagnosis not present

## 2019-06-21 DIAGNOSIS — N3000 Acute cystitis without hematuria: Secondary | ICD-10-CM

## 2019-06-21 DIAGNOSIS — R159 Full incontinence of feces: Secondary | ICD-10-CM | POA: Diagnosis present

## 2019-06-21 DIAGNOSIS — I9589 Other hypotension: Secondary | ICD-10-CM | POA: Diagnosis not present

## 2019-06-21 DIAGNOSIS — K521 Toxic gastroenteritis and colitis: Secondary | ICD-10-CM | POA: Diagnosis not present

## 2019-06-21 DIAGNOSIS — F418 Other specified anxiety disorders: Secondary | ICD-10-CM | POA: Diagnosis present

## 2019-06-21 DIAGNOSIS — S72142S Displaced intertrochanteric fracture of left femur, sequela: Secondary | ICD-10-CM | POA: Diagnosis not present

## 2019-06-21 DIAGNOSIS — E876 Hypokalemia: Secondary | ICD-10-CM | POA: Diagnosis not present

## 2019-06-21 DIAGNOSIS — R0902 Hypoxemia: Secondary | ICD-10-CM | POA: Diagnosis not present

## 2019-06-21 DIAGNOSIS — E861 Hypovolemia: Secondary | ICD-10-CM | POA: Diagnosis not present

## 2019-06-21 DIAGNOSIS — Z419 Encounter for procedure for purposes other than remedying health state, unspecified: Secondary | ICD-10-CM

## 2019-06-21 DIAGNOSIS — R7303 Prediabetes: Secondary | ICD-10-CM | POA: Diagnosis present

## 2019-06-21 DIAGNOSIS — F0391 Unspecified dementia with behavioral disturbance: Secondary | ICD-10-CM | POA: Diagnosis not present

## 2019-06-21 DIAGNOSIS — M25462 Effusion, left knee: Secondary | ICD-10-CM | POA: Diagnosis not present

## 2019-06-21 DIAGNOSIS — M25552 Pain in left hip: Secondary | ICD-10-CM | POA: Diagnosis not present

## 2019-06-21 DIAGNOSIS — E860 Dehydration: Secondary | ICD-10-CM | POA: Diagnosis not present

## 2019-06-21 DIAGNOSIS — M8088XD Other osteoporosis with current pathological fracture, vertebra(e), subsequent encounter for fracture with routine healing: Secondary | ICD-10-CM | POA: Diagnosis present

## 2019-06-21 DIAGNOSIS — M545 Low back pain: Secondary | ICD-10-CM | POA: Diagnosis present

## 2019-06-21 DIAGNOSIS — Z7982 Long term (current) use of aspirin: Secondary | ICD-10-CM | POA: Diagnosis not present

## 2019-06-21 DIAGNOSIS — Y92009 Unspecified place in unspecified non-institutional (private) residence as the place of occurrence of the external cause: Secondary | ICD-10-CM | POA: Diagnosis not present

## 2019-06-21 DIAGNOSIS — F05 Delirium due to known physiological condition: Secondary | ICD-10-CM | POA: Diagnosis not present

## 2019-06-21 DIAGNOSIS — S8991XA Unspecified injury of right lower leg, initial encounter: Secondary | ICD-10-CM | POA: Diagnosis not present

## 2019-06-21 DIAGNOSIS — S0990XA Unspecified injury of head, initial encounter: Secondary | ICD-10-CM | POA: Diagnosis not present

## 2019-06-21 DIAGNOSIS — S72002A Fracture of unspecified part of neck of left femur, initial encounter for closed fracture: Secondary | ICD-10-CM | POA: Diagnosis not present

## 2019-06-21 DIAGNOSIS — D649 Anemia, unspecified: Secondary | ICD-10-CM | POA: Diagnosis present

## 2019-06-21 DIAGNOSIS — M8008XA Age-related osteoporosis with current pathological fracture, vertebra(e), initial encounter for fracture: Secondary | ICD-10-CM | POA: Diagnosis not present

## 2019-06-21 DIAGNOSIS — Z87440 Personal history of urinary (tract) infections: Secondary | ICD-10-CM | POA: Diagnosis not present

## 2019-06-21 DIAGNOSIS — Z8249 Family history of ischemic heart disease and other diseases of the circulatory system: Secondary | ICD-10-CM

## 2019-06-21 DIAGNOSIS — Z85038 Personal history of other malignant neoplasm of large intestine: Secondary | ICD-10-CM | POA: Diagnosis not present

## 2019-06-21 DIAGNOSIS — Z03818 Encounter for observation for suspected exposure to other biological agents ruled out: Secondary | ICD-10-CM | POA: Diagnosis not present

## 2019-06-21 DIAGNOSIS — E785 Hyperlipidemia, unspecified: Secondary | ICD-10-CM | POA: Diagnosis present

## 2019-06-21 DIAGNOSIS — S72142G Displaced intertrochanteric fracture of left femur, subsequent encounter for closed fracture with delayed healing: Secondary | ICD-10-CM | POA: Diagnosis not present

## 2019-06-21 DIAGNOSIS — N390 Urinary tract infection, site not specified: Secondary | ICD-10-CM | POA: Diagnosis not present

## 2019-06-21 DIAGNOSIS — R404 Transient alteration of awareness: Secondary | ICD-10-CM | POA: Diagnosis not present

## 2019-06-21 DIAGNOSIS — Z888 Allergy status to other drugs, medicaments and biological substances status: Secondary | ICD-10-CM

## 2019-06-21 DIAGNOSIS — S2243XD Multiple fractures of ribs, bilateral, subsequent encounter for fracture with routine healing: Secondary | ICD-10-CM | POA: Diagnosis not present

## 2019-06-21 DIAGNOSIS — Z9049 Acquired absence of other specified parts of digestive tract: Secondary | ICD-10-CM

## 2019-06-21 DIAGNOSIS — S72142D Displaced intertrochanteric fracture of left femur, subsequent encounter for closed fracture with routine healing: Secondary | ICD-10-CM | POA: Diagnosis not present

## 2019-06-21 DIAGNOSIS — A499 Bacterial infection, unspecified: Secondary | ICD-10-CM | POA: Diagnosis not present

## 2019-06-21 DIAGNOSIS — W1830XA Fall on same level, unspecified, initial encounter: Secondary | ICD-10-CM | POA: Diagnosis present

## 2019-06-21 DIAGNOSIS — Z8042 Family history of malignant neoplasm of prostate: Secondary | ICD-10-CM

## 2019-06-21 DIAGNOSIS — M79661 Pain in right lower leg: Secondary | ICD-10-CM | POA: Diagnosis not present

## 2019-06-21 DIAGNOSIS — R51 Headache: Secondary | ICD-10-CM | POA: Diagnosis not present

## 2019-06-21 DIAGNOSIS — S199XXA Unspecified injury of neck, initial encounter: Secondary | ICD-10-CM | POA: Diagnosis not present

## 2019-06-21 DIAGNOSIS — T3695XA Adverse effect of unspecified systemic antibiotic, initial encounter: Secondary | ICD-10-CM | POA: Diagnosis not present

## 2019-06-21 DIAGNOSIS — Z880 Allergy status to penicillin: Secondary | ICD-10-CM

## 2019-06-21 DIAGNOSIS — R32 Unspecified urinary incontinence: Secondary | ICD-10-CM | POA: Diagnosis present

## 2019-06-21 DIAGNOSIS — R197 Diarrhea, unspecified: Secondary | ICD-10-CM | POA: Diagnosis not present

## 2019-06-21 LAB — CK: Total CK: 39 U/L (ref 38–234)

## 2019-06-21 LAB — URINALYSIS, ROUTINE W REFLEX MICROSCOPIC
Bilirubin Urine: NEGATIVE
Glucose, UA: NEGATIVE mg/dL
Hgb urine dipstick: NEGATIVE
Ketones, ur: 5 mg/dL — AB
Nitrite: NEGATIVE
Protein, ur: 100 mg/dL — AB
Specific Gravity, Urine: 1.018 (ref 1.005–1.030)
WBC, UA: >50 WBC/hpf — ABNORMAL HIGH (ref 0–5)
pH: 7 (ref 5.0–8.0)

## 2019-06-21 LAB — COMPREHENSIVE METABOLIC PANEL
ALT: 14 U/L (ref 0–44)
AST: 24 U/L (ref 15–41)
Albumin: 3.7 g/dL (ref 3.5–5.0)
Alkaline Phosphatase: 44 U/L (ref 38–126)
Anion gap: 12 (ref 5–15)
BUN: 22 mg/dL (ref 8–23)
CO2: 22 mmol/L (ref 22–32)
Calcium: 8.6 mg/dL — ABNORMAL LOW (ref 8.9–10.3)
Chloride: 104 mmol/L (ref 98–111)
Creatinine, Ser: 0.74 mg/dL (ref 0.44–1.00)
GFR calc Af Amer: >60 mL/min (ref >60)
GFR calc non Af Amer: >60 mL/min (ref >60)
Glucose, Bld: 145 mg/dL — ABNORMAL HIGH (ref 70–99)
Potassium: 3.9 mmol/L (ref 3.5–5.1)
Sodium: 138 mmol/L (ref 135–145)
Total Bilirubin: 1 mg/dL (ref 0.3–1.2)
Total Protein: 5.9 g/dL — ABNORMAL LOW (ref 6.5–8.1)

## 2019-06-21 LAB — MAGNESIUM: Magnesium: 1.5 mg/dL — ABNORMAL LOW (ref 1.7–2.4)

## 2019-06-21 LAB — CBC WITH DIFFERENTIAL/PLATELET
Abs Immature Granulocytes: 0.05 10*3/uL (ref 0.00–0.07)
Basophils Absolute: 0 10*3/uL (ref 0.0–0.1)
Basophils Relative: 0 %
Eosinophils Absolute: 0 10*3/uL (ref 0.0–0.5)
Eosinophils Relative: 0 %
HCT: 36.4 % (ref 36.0–46.0)
Hemoglobin: 12.1 g/dL (ref 12.0–15.0)
Immature Granulocytes: 0 %
Lymphocytes Relative: 7 %
Lymphs Abs: 0.9 10*3/uL (ref 0.7–4.0)
MCH: 33.2 pg (ref 26.0–34.0)
MCHC: 33.2 g/dL (ref 30.0–36.0)
MCV: 99.7 fL (ref 80.0–100.0)
Monocytes Absolute: 0.9 10*3/uL (ref 0.1–1.0)
Monocytes Relative: 7 %
Neutro Abs: 11.8 10*3/uL — ABNORMAL HIGH (ref 1.7–7.7)
Neutrophils Relative %: 86 %
Platelets: 142 10*3/uL — ABNORMAL LOW (ref 150–400)
RBC: 3.65 MIL/uL — ABNORMAL LOW (ref 3.87–5.11)
RDW: 12.7 % (ref 11.5–15.5)
WBC: 13.7 10*3/uL — ABNORMAL HIGH (ref 4.0–10.5)
nRBC: 0 % (ref 0.0–0.2)

## 2019-06-21 LAB — PHOSPHORUS: Phosphorus: 3 mg/dL (ref 2.5–4.6)

## 2019-06-21 LAB — SARS CORONAVIRUS 2 BY RT PCR (HOSPITAL ORDER, PERFORMED IN ~~LOC~~ HOSPITAL LAB): SARS Coronavirus 2: NEGATIVE

## 2019-06-21 LAB — ABO/RH: ABO/RH(D): O POS

## 2019-06-21 MED ORDER — FENTANYL CITRATE (PF) 100 MCG/2ML IJ SOLN
50.0000 ug | Freq: Once | INTRAMUSCULAR | Status: AC
  Administered 2019-06-21: 50 ug via INTRAVENOUS
  Filled 2019-06-21: qty 2

## 2019-06-21 MED ORDER — SODIUM CHLORIDE 0.9 % IV BOLUS
500.0000 mL | Freq: Once | INTRAVENOUS | Status: AC
  Administered 2019-06-21: 500 mL via INTRAVENOUS

## 2019-06-21 MED ORDER — MORPHINE SULFATE (PF) 4 MG/ML IV SOLN
4.0000 mg | Freq: Once | INTRAVENOUS | Status: AC
  Administered 2019-06-21: 4 mg via INTRAVENOUS
  Filled 2019-06-21: qty 1

## 2019-06-21 MED ORDER — SODIUM CHLORIDE 0.9 % IV BOLUS: 1000.0000 mL | Freq: Once | INTRAVENOUS | Status: DC

## 2019-06-21 NOTE — ED Notes (Signed)
Patient transported to X-ray 

## 2019-06-21 NOTE — ED Notes (Signed)
Patient transported to CT 

## 2019-06-21 NOTE — H&P (Signed)
History and Physical    CIELLE AGUILA MPN:[id number] DOB: Jun 03, 1923 DOA: 06/21/2019  PCP: Abner Greenspan, MD   Patient coming from: Home.  I have personally briefly reviewed patient's old medical records in [place]  Chief Complaint: Fall.  HPI: Michelle Gregory is a 85 y.o. female with medical history significant of allergic rhinitis, anxiety, depression, history of C. difficile diarrhea, GERD, hyperlipidemia, colon cancer, history of recurrent UTIs, hypertension, osteoarthritis, history of compression who was brought to the emergency department via EMS after having a fall at home.  The patient is unable to provide further history as she is currently confused following opioid administration.  Please see the ED notes for further detail.  ED Course: Initial vital signs temperature 98.1 F, pulse 88, respirations 16, blood pressure 115/81 mmHg and O2 sat 93% on room air.  Orthopedic surgery was contacted by emergency department and are planning surgical intervention on 06/22/2019.  She received morphine sulfate 4 mg IVP x3.  Fentanyl 50 mcg IVP x1.  Ceftriaxone 1 g IVPB x1.  CBC shows white count of 10.7, hemoglobin 8.3 g/dL and platelets 243.  PT and INR were normal.  Sodium 132, chloride 92 mmol/L.  Glucose 314, BUN 34 creatinine 1.05 mg/dL.  Her left hip x-ray shows dislocation of the prosthetic joint with periosteal fracture.  Review of Systems:  Unable to obtain.  Past Medical History:  Diagnosis Date   Allergic rhinitis    Anxiety    C. difficile diarrhea 10/2017   Cancer ([place])    Depression    GERD (gastroesophageal reflux disease)    History of colon cancer    History of recurrent UTIs    Hyperlipidemia    Hypertension    Osteoarthritis    Spinal compression fracture (HCC)    Vertigo     Past Surgical History:  Procedure Laterality Date   APPENDECTOMY     CATARACT EXTRACTION     COLON RESECTION     TONSILLECTOMY       reports that she has never  smoked. She has never used smokeless tobacco. She reports that she does not drink alcohol or use drugs.  Allergies  Allergen Reactions   Amoxicillin-Pot Clavulanate Diarrhea    Has patient had a PCN reaction causing immediate rash, facial/tongue/throat swelling, SOB or lightheadedness with hypotension: Yes Has patient had a PCN reaction causing severe rash involving mucus membranes or skin necrosis: No Has patient had a PCN reaction that required hospitalization: No Has patient had a PCN reaction occurring within the last 10 years: No If all of the above answers are "NO", then may proceed with Cephalosporin use.    Keflex [Cephalexin] Diarrhea   Septra [Sulfamethoxazole-Trimethoprim] Nausea Only    Family History  Problem Relation Age of Onset   Prostate cancer Father    Heart attack Mother    Gout Mother    Prior to Admission medications   Medication Sig Start Date End Date Taking? Authorizing Provider  allopurinol (ZYLOPRIM) 100 MG tablet Take 2 tablets (200 mg total) by mouth daily. Patient taking differently: Take 100 mg by mouth 2 (two) times daily.  01/23/19  Yes Tower, [name], MD  ALPRAZolam Duanne Moron) 0.5 MG tablet 1/2-1  pill by mouth up to twice daily as needed for anxiety or sleep 03/20/19  Yes Tower, [name], MD  Ascorbic Acid (VITAMIN C) 100 MG tablet Take 100 mg by mouth daily.   Yes [provider]  aspirin 81 MG tablet  Take 81 mg by mouth daily.     Yes [provider]  calcium-vitamin D (CALCIUM 500 +D) 500 MG tablet Take 1 tablet by mouth daily.    Yes [provider]  CRANBERRY PO Take 1 capsule by mouth 2 (two) times daily.   Yes [provider]  gabapentin (NEURONTIN) 300 MG capsule Take 300 mg by mouth 2 (two) times daily.  09/06/17  Yes [provider]  glucosamine-chondroitin 500-400 MG tablet Take 1 tablet by mouth daily.   Yes [provider]  HYDROcodone-acetaminophen (NORCO) 10-325 MG tablet Take 1  tablet by mouth 3 (three) times daily as needed for moderate pain.   Yes [provider]  losartan (COZAAR) 100 MG tablet Take 1 tablet (100 mg total) by mouth daily. 01/23/19  Yes Tower, [name], MD  meclizine (ANTIVERT) 25 MG tablet Take 1 tablet (25 mg total) by mouth 3 (three) times daily as needed for dizziness. 12/07/18  Yes Tower, [name], MD  metoprolol tartrate (LOPRESSOR) 50 MG tablet Take 1 tablet (50 mg total) by mouth 2 (two) times daily. 01/23/19  Yes Tower, [name], MD  multivitamin [facility]) per tablet Take 1 tablet by mouth daily.     Yes [provider]  PARoxetine (PAXIL) 30 MG tablet Take 1 tablet (30 mg total) by mouth every morning. 01/23/19  Yes Tower, [name], MD  Probiotic Product (PROBIOTIC DAILY PO) Take 1 tablet by mouth daily.   Yes [provider]  raloxifene (EVISTA) 60 MG tablet Take 1 tablet (60 mg total) by mouth daily. 01/23/19  Yes Tower, [name], MD  HYDROcodone-acetaminophen (NORCO/VICODIN) 5-325 MG tablet Take 1 tablet by mouth every 4 (four) hours as needed for moderate pain. Patient not taking: Reported on 06/21/2019 04/07/18   Velvet Bathe, MD  nitrofurantoin, macrocrystal-monohydrate, (MACROBID) 100 MG capsule Take 1 capsule (100 mg total) by mouth 2 (two) times daily. Patient not taking: Reported on 06/21/2019 03/20/19   Tower, [name], MD  valACYclovir (VALTREX) 1000 MG tablet Take 1 tablet (1,000 mg total) by mouth 3 (three) times daily. Patient not taking: Reported on 06/21/2019 01/23/19   Abner Greenspan, MD  Glucosamine-Chondroitin 1500-1200 MG/30ML LIQD Take by mouth. As dierected   11/02/19  [provider]    Physical Exam: Vitals:   06/21/19 1648 06/21/19 1700 06/21/19 1913 06/21/19 2030  BP: 135/78 110/63 104/65 137/86  Pulse: 80 90 75 93  Resp:  (!) 22 17 (!) 21  Temp: 98.8 F (37.1 C)     TempSrc: Oral     SpO2: 93% 96% 97% 100%    Constitutional: Frail, elderly female. Eyes: PERRL, lids and conjunctivae  normal ENMT: Mucous membranes are mildly dry.  Posterior pharynx clear of any exudate or lesions.  Neck: normal, supple, no masses, no thyromegaly Respiratory: Decreased breath sounds in bases, otherwise clear to auscultation bilaterally, no wheezing, no crackles. Normal respiratory effort. No accessory muscle use.  Cardiovascular: Regular rate and rhythm, no murmurs / rubs / gallops. No extremity edema. 2+ pedal pulses. No carotid bruits.  Abdomen: Soft, no tenderness, no masses palpated. No hepatosplenomegaly. Bowel sounds positive.  Musculoskeletal: no clubbing / cyanosis.  Shortened and internally rotated LLE.  Decreased ROM of LLE.  Normal muscle tone.  Skin: no rashes, lesions, ulcers on limited dermatological examination. Neurologic: CN 2-12 grossly intact. Sensation intact, DTR normal. Strength 5/5 in all 4.  Psychiatric: Normal judgment and insight. Alert and oriented x 3. Normal mood.  Labs on Admission: I have personally reviewed following labs and imaging studies  CBC: Recent Labs  Lab 06/21/19 1911  WBC 13.7*  NEUTROABS 11.8*  HGB 12.1  HCT 36.4  MCV 99.7  PLT 425*   Basic Metabolic Panel: Recent Labs  Lab 06/21/19 1911  NA 138  K 3.9  CL 104  CO2 22  GLUCOSE 145*  BUN 22  CREATININE 0.74  CALCIUM 8.6*   GFR: CrCl cannot be calculated (Unknown ideal weight.). Liver Function Tests: Recent Labs  Lab 06/21/19 1911  AST 24  ALT 14  ALKPHOS 44  BILITOT 1.0  PROT 5.9*  ALBUMIN 3.7   No results for input(s): LIPASE, AMYLASE in the last 168 hours. No results for input(s): AMMONIA in the last 168 hours. Coagulation Profile: No results for input(s): INR, PROTIME in the last 168 hours. Cardiac Enzymes: Recent Labs  Lab 06/21/19 1911  CKTOTAL 39   BNP (last 3 results) No results for input(s): PROBNP in the last 8760 hours. HbA1C: No results for input(s): HGBA1C in the last 72 hours. CBG: No results for input(s): GLUCAP in the last 168 hours. Lipid  Profile: No results for input(s): CHOL, HDL, LDLCALC, TRIG, CHOLHDL, LDLDIRECT in the last 72 hours. Thyroid Function Tests: No results for input(s): TSH, T4TOTAL, FREET4, T3FREE, THYROIDAB in the last 72 hours. Anemia Panel: No results for input(s): VITAMINB12, FOLATE, FERRITIN, TIBC, IRON, RETICCTPCT in the last 72 hours. Urine analysis:    Component Value Date/Time   COLORURINE YELLOW 03/19/2019 1151   APPEARANCEUR CLOUDY (A) 03/19/2019 1151   LABSPEC 1.020 03/19/2019 1151   PHURINE 5.5 03/19/2019 1151   GLUCOSEU NEGATIVE 03/19/2019 1151   HGBUR NEGATIVE 03/19/2019 1151   HGBUR trace-lysed 12/12/2010 1509   BILIRUBINUR NEGATIVE 03/19/2019 1151   BILIRUBINUR Negative 01/30/2019 1155   KETONESUR NEGATIVE 03/19/2019 1151   PROTEINUR NEGATIVE 03/19/2019 1151   UROBILINOGEN 0.2 01/30/2019 1155   UROBILINOGEN 0.2 12/12/2010 1509   NITRITE POSITIVE (A) 03/19/2019 1151   LEUKOCYTESUR TRACE (A) 03/19/2019 1151    Radiological Exams on Admission: Dg Hip Unilat With Pelvis 2-3 Views Left  Result Date: 06/21/2019 CLINICAL DATA:  85 year old who fell earlier this evening and was found by her family on the floor at home. LEFT hip injury with pain. Initial encounter. EXAM: DG HIP (WITH OR WITHOUT PELVIS) 2-3V LEFT COMPARISON:  04/02/2018. FINDINGS: Comminuted intertrochanteric LEFT femoral neck fracture into multiple parts. Hip joint anatomically aligned with severe SUPERIOR joint space narrowing. Included AP pelvis demonstrates no fractures elsewhere. Sacroiliac joints and symphysis pubis anatomically aligned. Severe joint space narrowing involving the contralateral RIGHT hip. IMPRESSION: 1. Acute traumatic comminuted intertrochanteric LEFT femoral neck fracture. 2. Anatomic alignment the LEFT hip joint with severe SUPERIOR joint space narrowing. 3. Severe osteoarthritis involving the contralateral RIGHT hip. Electronically Signed   By: Evangeline Dakin M.D.   On: 06/21/2019 18:46    EKG:  Independently reviewed.  Vent. rate 92 BPM PR interval * ms QRS duration 88 ms QT/QTc 384/475 ms P-R-T axes 78 75 86 Sinus arrhythmia.  Assessment/Plan Principal Problem:   Closed left hip fracture, initial encounter ([place]) Admit to telemetry/inpatient. Keep n.p.o. Analgesics as needed. Hip fracture protocol per order set. Orthopedic surgery to evaluate in a.m. Consult PT, dietitian and social services.  Active Problems:   Hypomagnesemia Replaced. Follow-up level as needed.    Hyperlipidemia Currently not on medical therapy.    Essential hypertension Continue losartan 100 mg p.o. daily. Continue metoprolol 50 mg p.o. twice  daily. Monitor BP, HR, renal function electrolytes.    GERD Protonix 40 mg p.o. daily.    Prediabetes  Carbohydrate modified diet. CBG monitoring before meals.    Gout Continue allopurinol.    Dementia, senile ([place]) Supportive care. Minimize opioid use as possible.    Thrombocytopenia (HCC) Monitor platelet count.    Anxiety with depression Continue Paxil 30 mg p.o. daily. Hold alprazolam while getting narcotics.   DVT prophylaxis: Heparin SQ. Code Status: Full code. Family Communication: Disposition Plan: Admit for orthopedic surgery intervention. Consults called:  Dr. Lyla Glassing (orthopedic surgery) Admission status: Inpatient/telemetry.   Reubin Milan MD [facility]  If 7PM-7AM, please contact night-coverage [email or web address]  06/21/2019, 11:00 PM   This document was prepared using Dragon voice recognition software and may contain some unintended transcription errors.

## 2019-06-21 NOTE — ED Notes (Signed)
Family at bedside. 

## 2019-06-21 NOTE — ED Notes (Signed)
Pt aware that urine sample is needed. Unable to provide at this time  

## 2019-06-21 NOTE — ED Notes (Signed)
Pt. Documented in error see above note in chart. 

## 2019-06-21 NOTE — ED Notes (Signed)
Pt back from radiology 

## 2019-06-21 NOTE — ED Notes (Signed)
Per daughter, pt stays with daughter at night, is dropped at her own residence from appx 12p-5p.  Daughter expressed concern that pt may have UTI. Daughter reports that pt is acting more confused than usual, unable to describe what made her fall.

## 2019-06-21 NOTE — Progress Notes (Signed)
Consult received for L IT femur fx. EDP plans hospitalist admit. Surgery tomorrow. NPO after MN. Hold chemical DVT ppx. Bed rest for now. Will see patient tomorrow.

## 2019-06-21 NOTE — ED Notes (Signed)
EKG given to EDP,Messick,MD., for review.

## 2019-06-21 NOTE — ED Triage Notes (Signed)
Pt BIBA from home.    Per EMS-  Pt had mechanical fall appx 3 hr PTA.  Unwitnessed fall, on floor for appx 3 hrs before being found by family.  Pt c/o left hip/thigh pain, inward rotation to same leg.  No shortening.  Pt AOx3, confused about time. Family reports pt is at baseline.

## 2019-06-21 NOTE — ED Notes (Signed)
Urine and culture sent to lab  

## 2019-06-21 NOTE — ED Provider Notes (Signed)
[facility] DEPT Provider Note   CSN: [id number] Arrival date & time: 06/21/19  1625     History   Chief Complaint Chief Complaint  Patient presents with  . Fall    HPI Michelle Gregory is a 85 y.o. female with a history of hypertension, hyperlipidemia, anxiety, age-related osteoporosis, and dementia who presents to the emergency department status post fall with complaints of left hip pain.  Patient was at home alone, she states that she was walking from the kitchen to the living room, and fell.  She is unsure of what caused the fall and does not really remember.  She states she did hit her head but did not lose consciousness.  She was unable to get up on her own.  Ultimately remained on the floor for approximately 3 hours until family return.  She is complaining of pain primarily in her left hip, she is also having some lower back and right lower leg pain.  She notes that she does have a skin tear to the left elbow region.  Pain in the left hip is worse with movement.  No alleviating factors.  Initially her right leg felt a bit asleep but this has resolved.  She lives at home primarily alone, but her daughter lives next door and states that the house at night.  Patient is not on blood thinners.  She does not recall any chest pain, shortness breath, dizziness, or lightheadedness prior to the fall, but again is unsure why this occurred.  Her daughter is concerned that she may have a UTI she frequently falls when she has these.  Patient denies chest pain, abdominal pain, dyspnea, vomiting, current numbness, focal weakness, or loss of consciousness.  Last p.o. intake was around 1030 this morning per family report.  Family states that she is seems to be at her mental status baseline.     HPI  Past Medical History:  Diagnosis Date  . Allergic rhinitis   . Anxiety   . C. difficile diarrhea 10/2017  . Cancer ([place])   . Depression   . GERD (gastroesophageal reflux  disease)   . History of colon cancer   . History of recurrent UTIs   . Hyperlipidemia   . Hypertension   . Osteoarthritis   . Spinal compression fracture ([place])   . Vertigo     Patient Active Problem List   Diagnosis Date Noted  . Diarrhea 03/24/2019  . Delirium 02/01/2019  . Shingles 01/23/2019  . Right wrist pain 12/06/2018  . Laceration of wrist, right 12/06/2018  . Fracture, ulna, distal 12/06/2018  . Thrombocytopenia ([place]) 12/06/2018  . Dementia, senile ([place]) 11/22/2018  . Hand injury, left, initial encounter 09/01/2018  . H/O falling 05/03/2018  . Episodic confusion 05/03/2018  . Sacral fracture ([place]) 04/03/2018  . Fall at home 03/29/2018  . Anxiety disorder 03/29/2018  . Fatigue 03/29/2018  . Chronic pain 02/09/2018  . Cerumen impaction 12/06/2017  . H/O Clostridium difficile infection 10/13/2017  . Recurrent UTI (urinary tract infection) 09/06/2017  . Auditory hallucinations 03/03/2017  . Female cystocele 04/13/2016  . Bowel habit changes 12/30/2015  . Compression fracture 12/30/2015  . Routine general medical examination at a health care facility 04/09/2015  . UTI (urinary tract infection) 05/09/2014  . Candidal intertrigo 03/28/2014  . Encounter for Medicare annual wellness exam 03/21/2013  . Gout 09/19/2012  . Age related osteoporosis 05/05/2011  . Degenerative lumbar disc 01/20/2011  . VARICOSE VEINS, LOWER EXTREMITIES 09/04/2010  .  Prediabetes 01/08/2010  . Hyperlipidemia 05/09/2008  . Essential hypertension 05/09/2008  . HEMORRHOIDS, INTERNAL 05/09/2008  . Allergic rhinitis 05/09/2008  . Mild reactive airways disease 05/09/2008  . GERD 05/09/2008  . IBS 05/09/2008  . Osteoarthritis 05/09/2008  . History of malignant neoplasm of large intestine 05/09/2008    Past Surgical History:  Procedure Laterality Date  . APPENDECTOMY    . CATARACT EXTRACTION    . COLON RESECTION    . TONSILLECTOMY       OB History   No obstetric history on file.       Home Medications    Prior to Admission medications   Medication Sig Start Date End Date Taking? Authorizing Provider  allopurinol (ZYLOPRIM) 100 MG tablet Take 2 tablets (200 mg total) by mouth daily. 01/23/19   Tower, [name], MD  ALPRAZolam Duanne Moron) 0.5 MG tablet 1/2-1  pill by mouth up to twice daily as needed for anxiety or sleep 03/20/19   Tower, [name], MD  Ascorbic Acid (VITAMIN C) 100 MG tablet Take 100 mg by mouth daily.    [provider]  aspirin 81 MG tablet Take 81 mg by mouth daily.      [provider]  calcium-vitamin D (CALCIUM 500 +D) 500 MG tablet Take 1 tablet by mouth daily.     [provider]  CRANBERRY PO Take 1 capsule by mouth 2 (two) times daily.    [provider]  gabapentin (NEURONTIN) 300 MG capsule Take 300 mg by mouth 2 (two) times daily.  09/06/17   [provider]  glucosamine-chondroitin 500-400 MG tablet Take 1 tablet by mouth daily.    [provider]  HYDROcodone-acetaminophen (NORCO/VICODIN) 5-325 MG tablet Take 1 tablet by mouth every 4 (four) hours as needed for moderate pain. 04/07/18   Velvet Bathe, MD  losartan (COZAAR) 100 MG tablet Take 1 tablet (100 mg total) by mouth daily. 01/23/19   Tower, [name], MD  meclizine (ANTIVERT) 25 MG tablet Take 1 tablet (25 mg total) by mouth 3 (three) times daily as needed for dizziness. 12/07/18   Tower, [name], MD  metoprolol tartrate (LOPRESSOR) 50 MG tablet Take 1 tablet (50 mg total) by mouth 2 (two) times daily. 01/23/19   Tower, [name], MD  multivitamin [facility]) per tablet Take 1 tablet by mouth daily.      [provider]  nitrofurantoin, macrocrystal-monohydrate, (MACROBID) 100 MG capsule Take 1 capsule (100 mg total) by mouth 2 (two) times daily. 03/20/19   Tower, [name], MD  PARoxetine (PAXIL) 30 MG tablet Take 1 tablet (30 mg total) by mouth every morning. 01/23/19   Tower, [name], MD  Probiotic Product (PROBIOTIC DAILY PO) Take 1 tablet by  mouth daily.    [provider]  raloxifene (EVISTA) 60 MG tablet Take 1 tablet (60 mg total) by mouth daily. 01/23/19   Tower, [name], MD  valACYclovir (VALTREX) 1000 MG tablet Take 1 tablet (1,000 mg total) by mouth 3 (three) times daily. 01/23/19   Tower, [name], MD  Glucosamine-Chondroitin 1500-1200 MG/30ML LIQD Take by mouth. As dierected   11/02/19  [provider]    Family History Family History  Problem Relation Age of Onset  . Prostate cancer Father   . Heart attack Mother   . Gout Mother     Social History Social History   Tobacco Use  . Smoking status: Never Smoker  . Smokeless tobacco: Never Used  Substance Use Topics  .  Alcohol use: No    Alcohol/week: 0.0 standard drinks  . Drug use: No     Allergies   Amoxicillin-pot clavulanate, Keflex [cephalexin], and Septra [sulfamethoxazole-trimethoprim]   Review of Systems Review of Systems  Constitutional: Negative for chills and fever.  Eyes: Negative for visual disturbance.  Respiratory: Negative for shortness of breath.   Cardiovascular: Negative for chest pain.  Gastrointestinal: Negative for vomiting.  Musculoskeletal: Positive for arthralgias and back pain.  Neurological: Negative for dizziness, syncope, weakness, numbness and headaches.  All other systems reviewed and are negative.    Physical Exam Updated Vital Signs BP 135/78 (BP Location: Left Arm)   Pulse 80   Temp 98.8 F (37.1 C) (Oral)   Resp 17   LMP 11/02/1980   SpO2 93%   Physical Exam Vitals signs and nursing note reviewed.  Constitutional:      General: She is not in acute distress.    Appearance: She is well-developed.  HENT:     Head: Normocephalic and atraumatic. No raccoon eyes or Battle's sign.     Right Ear: No hemotympanum.     Left Ear: No hemotympanum.  Eyes:     General:        Right eye: No discharge.        Left eye: No discharge.     Conjunctiva/sclera: Conjunctivae normal.     Pupils: Pupils  are equal, round, and reactive to light.  Neck:     Comments: C-collar in place maintained.  Cardiovascular:     Rate and Rhythm: Normal rate and regular rhythm.     Pulses:          Radial pulses are 2+ on the right side and 2+ on the left side.       Dorsalis pedis pulses are 2+ on the right side and 2+ on the left side.     Heart sounds: No murmur.  Pulmonary:     Effort: No respiratory distress.     Breath sounds: Normal breath sounds. No wheezing or rales.  Chest:     Chest wall: No deformity or tenderness.  Abdominal:     General: There is no distension.     Palpations: Abdomen is soft.     Tenderness: There is no abdominal tenderness.  Musculoskeletal:     Comments: Upper extremities: Patient has an abrasion to the distal left posterior humerus without active bleeding.  No significant ecchymosis noted.  She has intact active range of motion throughout without point/focal bony tenderness. Back: No thoracic spine tenderness.  Diffuse tenderness throughout the lumbar spine region which patient states is mild and may be her typical back pain.  No palpable step-off.  No point/focal vertebral tenderness. Lower extremities: Left lower extremity is shortened and internally rotated.  Otherwise no obvious deformities.  No significant open wounds.  Patient has intact active range of motion throughout her right lower extremity.  Left lower extremity range of motion limited at the hip and the knee, intact at the ankle.  Patient is tender to palpation diffusely throughout the left hip extending to the proximal 1/3rd of the femur.  Patient has some mild tenderness to the mid right lower leg.  Lower extremities are otherwise nontender.  Skin:    General: Skin is warm and dry.     Findings: No rash.  Neurological:     Comments: Alert.  Clear speech.  CN III through XII grossly intact.  Sensation grossly intact x4.  5 out of 5  symmetric grip strength.  5 out of 5 strength with plantar dorsiflexion  bilaterally.  Psychiatric:        Behavior: Behavior normal.    ED Treatments / Results  Labs (all labs ordered are listed, but only abnormal results are displayed) Labs Reviewed  CBC WITH DIFFERENTIAL/PLATELET - Abnormal; Notable for the following components:      Result Value   WBC 13.7 (*)    RBC 3.65 (*)    Platelets 142 (*)    Neutro Abs 11.8 (*)    All other components within normal limits  COMPREHENSIVE METABOLIC PANEL - Abnormal; Notable for the following components:   Glucose, Bld 145 (*)    Calcium 8.6 (*)    Total Protein 5.9 (*)    All other components within normal limits  URINALYSIS, ROUTINE W REFLEX MICROSCOPIC - Abnormal; Notable for the following components:   Color, Urine AMBER (*)    APPearance CLOUDY (*)    Ketones, ur 5 (*)    Protein, ur 100 (*)    Leukocytes,Ua LARGE (*)    WBC, UA >50 (*)    Bacteria, UA RARE (*)    All other components within normal limits  MAGNESIUM - Abnormal; Notable for the following components:   Magnesium 1.5 (*)    All other components within normal limits  SARS CORONAVIRUS 2 (HOSPITAL ORDER, [facility])  URINE CULTURE  CK  PHOSPHORUS  PROTIME-INR  TYPE AND SCREEN  ABO/RH    EKG EKG Interpretation  Date/Time:  Wednesday June 21 2019 22:25:40 EDT Ventricular Rate:  92 PR Interval:    QRS Duration: 88 QT Interval:  384 QTC Calculation: 475 R Axis:   75 Text Interpretation:  Unknown rhythm, irregular rate Confirmed by Dene Gentry [phone]) on 06/21/2019 10:28:45 PM   Radiology Dg Hip Unilat With Pelvis 2-3 Views Left  Result Date: 06/21/2019 CLINICAL DATA:  85 year old who fell earlier this evening and was found by her family on the floor at home. LEFT hip injury with pain. Initial encounter. EXAM: DG HIP (WITH OR WITHOUT PELVIS) 2-3V LEFT COMPARISON:  04/02/2018. FINDINGS: Comminuted intertrochanteric LEFT femoral neck fracture into multiple parts. Hip joint anatomically aligned with  severe SUPERIOR joint space narrowing. Included AP pelvis demonstrates no fractures elsewhere. Sacroiliac joints and symphysis pubis anatomically aligned. Severe joint space narrowing involving the contralateral RIGHT hip. IMPRESSION: 1. Acute traumatic comminuted intertrochanteric LEFT femoral neck fracture. 2. Anatomic alignment the LEFT hip joint with severe SUPERIOR joint space narrowing. 3. Severe osteoarthritis involving the contralateral RIGHT hip. Electronically Signed   By: Evangeline Dakin M.D.   On: 06/21/2019 18:46    Procedures Procedures (including critical care time)  Medications Ordered in ED Medications - No data to display   Initial Impression / Assessment and Plan / ED Course  I have reviewed the triage vital signs and the nursing notes.  Pertinent labs & imaging results that were available during my care of the patient were reviewed by me and considered in my medical decision making (see chart for details).   Patient presents to the emergency department status post fall of unclear etiology. Patient nontoxic-appearing, no apparent distress, vitals WNL. She reports head injury without loss of consciousness, cervical spine collar applied by EMS, will proceed with CT head/C-spine.  No thoracic spine tenderness.  Diffuse L-spine tenderness which may be from her chronic back pain related to compression fractures, will proceed with CT of the lumbar spine.  Minimal tenderness to  the right lower leg, will obtain plain film.  She has obvious deformity of the left lower extremity, suspect left hip fracture, neurovascularly intact distally. Plan for basic labs to include urinalysis, will also obtain CK given patient was on the floor for several hours.  Preop chest x-ray and EKG also obtained given suspicion for fracture.  Fentanyl ordered for pain.    L hip Fx: 1. Acute traumatic comminuted intertrochanteric LEFT femoral neck fracture. 2. Anatomic alignment the LEFT hip joint with severe  SUPERIOR joint space narrowing. 3. Severe osteoarthritis involving the contralateral RIGHT hip.   Will discuss with orthopedic surgery.  Patient and her daughter are requesting Dr. Anne Fu group- [facility].   Discussed w/ Dr. Lyla Glassing on call for [facility]-requesting hospitalist admission, n.p.o. after midnight, he will perform repair tomorrow.  He is also requesting x-ray of the left femur.  Preliminary reads of additional imaging reviewed- difficulty with imaging software.  CT head without acute intracranial normality.   CT cervical spine without fracture, spondylosis present CT L-spine: No acute abnormality, old prior fractures. DG R tib/fib: Negative for acute process.  Osteoarthritis present.  Labs reviewed:  CBC: Mild leukocytosis.  No significant anemia.  Mild thrombocytopenia similar to prior. CMP with hyperglycemia and mild hypocalcemia. CK: WNL Type and screen obtained. COVID Negative.   Michelle Gregory was evaluated in Emergency Department on 06/21/19 for the symptoms described in the history of present illness. He/she was evaluated in the context of the global COVID-19 pandemic, which necessitated consideration that the patient might be at risk for infection with the SARS-CoV-2 virus that causes COVID-19. Institutional protocols and algorithms that pertain to the evaluation of patients at risk for COVID-19 are in a state of rapid change based on information released by regulatory bodies including the CDC and federal and state organizations. These policies and algorithms were followed during the patient's care in the ED.  UA pending.  Consult hospitalist service for admission.  22:24: CONSULT: Discussed with hospitalist Dr. Olevia Bowens who accepts admission.   UA resulted after admission- consistent w/ UTI- will start Rocephin.   Final Clinical Impressions(s) / ED Diagnoses   Final diagnoses:  Fall, initial encounter  Acute cystitis without hematuria  Closed  displaced intertrochanteric fracture of left femur, initial encounter [facility])    ED Discharge Orders    None       Amaryllis Dyke, PA-C 06/22/19 0017    Valarie Merino, MD 06/26/19 0900

## 2019-06-22 ENCOUNTER — Inpatient Hospital Stay (HOSPITAL_COMMUNITY): Payer: MEDICARE | Admitting: Certified Registered"

## 2019-06-22 ENCOUNTER — Other Ambulatory Visit: Payer: Self-pay

## 2019-06-22 ENCOUNTER — Inpatient Hospital Stay (HOSPITAL_COMMUNITY): Payer: MEDICARE

## 2019-06-22 ENCOUNTER — Encounter (HOSPITAL_COMMUNITY): Payer: Self-pay

## 2019-06-22 ENCOUNTER — Telehealth: Payer: Self-pay | Admitting: Family Medicine

## 2019-06-22 ENCOUNTER — Encounter (HOSPITAL_COMMUNITY)
Admission: EM | Disposition: A | Discharge status: Rehabilitation Facility | Payer: Self-pay | Source: Home / Self Care | Attending: Internal Medicine

## 2019-06-22 DIAGNOSIS — S72142D Displaced intertrochanteric fracture of left femur, subsequent encounter for closed fracture with routine healing: Secondary | ICD-10-CM

## 2019-06-22 HISTORY — PX: INTRAMEDULLARY (IM) NAIL INTERTROCHANTERIC: SHX5875

## 2019-06-22 LAB — CBC
HCT: 30.2 % — ABNORMAL LOW (ref 36.0–46.0)
Hemoglobin: 9.7 g/dL — ABNORMAL LOW (ref 12.0–15.0)
MCH: 33.1 pg (ref 26.0–34.0)
MCHC: 32.1 g/dL (ref 30.0–36.0)
MCV: 103.1 fL — ABNORMAL HIGH (ref 80.0–100.0)
Platelets: UNDETERMINED 10*3/uL (ref 150–400)
RBC: 2.93 MIL/uL — ABNORMAL LOW (ref 3.87–5.11)
RDW: 13 % (ref 11.5–15.5)
WBC: 14.2 10*3/uL — ABNORMAL HIGH (ref 4.0–10.5)
nRBC: 0 % (ref 0.0–0.2)

## 2019-06-22 LAB — GLUCOSE, CAPILLARY: Glucose-Capillary: 133 mg/dL — ABNORMAL HIGH (ref 70–99)

## 2019-06-22 LAB — CREATININE, SERUM
Creatinine, Ser: 1.03 mg/dL — ABNORMAL HIGH (ref 0.44–1.00)
GFR calc Af Amer: 54 mL/min — ABNORMAL LOW (ref >60)
GFR calc non Af Amer: 46 mL/min — ABNORMAL LOW (ref >60)

## 2019-06-22 LAB — PROTIME-INR
INR: 1.1 (ref 0.8–1.2)
INR: 1.1 (ref 0.8–1.2)
Prothrombin Time: 14.4 seconds (ref 11.4–15.2)
Prothrombin Time: 13.6 seconds (ref 11.4–15.2)

## 2019-06-22 SURGERY — FIXATION, FRACTURE, INTERTROCHANTERIC, WITH INTRAMEDULLARY ROD
Anesthesia: General | Laterality: Left

## 2019-06-22 MED ORDER — SODIUM CHLORIDE 0.9 % IV SOLN
1.0000 g | Freq: Once | INTRAVENOUS | Status: AC
  Administered 2019-06-22: 1 g via INTRAVENOUS
  Filled 2019-06-22: qty 10

## 2019-06-22 MED ORDER — PHENYLEPHRINE 40 MCG/ML (10ML) SYRINGE FOR IV PUSH (FOR BLOOD PRESSURE SUPPORT)
PREFILLED_SYRINGE | INTRAVENOUS | Status: DC | PRN
  Administered 2019-06-22 (×2): 80 ug via INTRAVENOUS
  Administered 2019-06-22 (×2): 120 ug via INTRAVENOUS
  Administered 2019-06-22: 100 ug via INTRAVENOUS
  Administered 2019-06-22 (×3): 80 ug via INTRAVENOUS
  Administered 2019-06-22: 60 ug via INTRAVENOUS
  Administered 2019-06-22: 80 ug via INTRAVENOUS

## 2019-06-22 MED ORDER — PAROXETINE HCL 20 MG PO TABS
30.0000 mg | ORAL_TABLET | Freq: Every morning | ORAL | Status: DC
  Administered 2019-06-23 – 2019-06-24 (×2): 30 mg via ORAL
  Filled 2019-06-22 (×4): qty 1

## 2019-06-22 MED ORDER — LOSARTAN POTASSIUM 50 MG PO TABS
100.0000 mg | ORAL_TABLET | Freq: Every day | ORAL | Status: DC
  Administered 2019-06-23: 100 mg via ORAL
  Filled 2019-06-22: qty 2

## 2019-06-22 MED ORDER — HYDROCODONE-ACETAMINOPHEN 10-325 MG PO TABS
1.0000 | ORAL_TABLET | Freq: Three times a day (TID) | ORAL | Status: DC | PRN
  Administered 2019-06-23 – 2019-06-26 (×2): 1 via ORAL
  Filled 2019-06-22 (×2): qty 1

## 2019-06-22 MED ORDER — PROPOFOL 10 MG/ML IV BOLUS
INTRAVENOUS | Status: AC
  Filled 2019-06-22: qty 20

## 2019-06-22 MED ORDER — ONDANSETRON HCL 4 MG/2ML IJ SOLN
4.0000 mg | Freq: Four times a day (QID) | INTRAMUSCULAR | Status: DC | PRN
  Administered 2019-06-23: 4 mg via INTRAVENOUS
  Filled 2019-06-22: qty 2

## 2019-06-22 MED ORDER — MECLIZINE HCL 25 MG PO TABS: 25.0000 mg | ORAL_TABLET | Freq: Three times a day (TID) | ORAL | Status: DC | PRN

## 2019-06-22 MED ORDER — ENOXAPARIN SODIUM 40 MG/0.4ML ~~LOC~~ SOLN: 40.0000 mg | SUBCUTANEOUS | Status: DC

## 2019-06-22 MED ORDER — SODIUM CHLORIDE 0.9 % IV SOLN
INTRAVENOUS | Status: DC | PRN
  Administered 2019-06-22: 40 ug/min via INTRAVENOUS

## 2019-06-22 MED ORDER — DEXAMETHASONE SODIUM PHOSPHATE 10 MG/ML IJ SOLN
INTRAMUSCULAR | Status: DC | PRN
  Administered 2019-06-22: 8 mg via INTRAVENOUS

## 2019-06-22 MED ORDER — CHLORHEXIDINE GLUCONATE 4 % EX LIQD
60.0000 mL | Freq: Once | CUTANEOUS | Status: DC
  Filled 2019-06-22: qty 60

## 2019-06-22 MED ORDER — CEFAZOLIN SODIUM-DEXTROSE 2-4 GM/100ML-% IV SOLN
2.0000 g | Freq: Four times a day (QID) | INTRAVENOUS | Status: AC
  Administered 2019-06-22 (×2): 2 g via INTRAVENOUS
  Filled 2019-06-22 (×3): qty 100

## 2019-06-22 MED ORDER — CEFAZOLIN SODIUM-DEXTROSE 2-4 GM/100ML-% IV SOLN
2.0000 g | INTRAVENOUS | Status: AC
  Administered 2019-06-22: 2 g via INTRAVENOUS
  Filled 2019-06-22 (×2): qty 100

## 2019-06-22 MED ORDER — ROCURONIUM BROMIDE 10 MG/ML (PF) SYRINGE
PREFILLED_SYRINGE | INTRAVENOUS | Status: DC | PRN
  Administered 2019-06-22: 40 mg via INTRAVENOUS
  Administered 2019-06-22: 10 mg via INTRAVENOUS

## 2019-06-22 MED ORDER — LIDOCAINE 2% (20 MG/ML) 5 ML SYRINGE
INTRAMUSCULAR | Status: DC | PRN
  Administered 2019-06-22: 100 mg via INTRAVENOUS

## 2019-06-22 MED ORDER — PROPOFOL 10 MG/ML IV BOLUS
INTRAVENOUS | Status: DC | PRN
  Administered 2019-06-22: 100 mg via INTRAVENOUS
  Administered 2019-06-22: 60 mg via INTRAVENOUS

## 2019-06-22 MED ORDER — METOCLOPRAMIDE HCL 5 MG PO TABS: 5.0000 mg | ORAL_TABLET | Freq: Three times a day (TID) | ORAL | Status: DC | PRN

## 2019-06-22 MED ORDER — SODIUM CHLORIDE 0.9 % IR SOLN
Status: DC | PRN
  Administered 2019-06-22: 1000 mL

## 2019-06-22 MED ORDER — HEPARIN SODIUM (PORCINE) 5000 UNIT/ML IJ SOLN: 5000.0000 [IU] | Freq: Three times a day (TID) | INTRAMUSCULAR | Status: DC

## 2019-06-22 MED ORDER — DOCUSATE SODIUM 100 MG PO CAPS
100.0000 mg | ORAL_CAPSULE | Freq: Two times a day (BID) | ORAL | Status: DC
  Administered 2019-06-22 – 2019-06-26 (×8): 100 mg via ORAL
  Filled 2019-06-22 (×9): qty 1

## 2019-06-22 MED ORDER — ONDANSETRON HCL 4 MG PO TABS
4.0000 mg | ORAL_TABLET | Freq: Four times a day (QID) | ORAL | Status: DC | PRN
  Administered 2019-06-25: 4 mg via ORAL
  Filled 2019-06-22: qty 1

## 2019-06-22 MED ORDER — MENTHOL 3 MG MT LOZG
1.0000 | LOZENGE | OROMUCOSAL | Status: DC | PRN
  Filled 2019-06-22: qty 9

## 2019-06-22 MED ORDER — ROCURONIUM BROMIDE 10 MG/ML (PF) SYRINGE
PREFILLED_SYRINGE | INTRAVENOUS | Status: AC
  Filled 2019-06-22: qty 10

## 2019-06-22 MED ORDER — SUCCINYLCHOLINE CHLORIDE 200 MG/10ML IV SOSY
PREFILLED_SYRINGE | INTRAVENOUS | Status: DC | PRN
  Administered 2019-06-22: 100 mg via INTRAVENOUS

## 2019-06-22 MED ORDER — ASPIRIN EC 81 MG PO TBEC
81.0000 mg | DELAYED_RELEASE_TABLET | Freq: Every day | ORAL | Status: DC
  Administered 2019-06-22 – 2019-06-26 (×5): 81 mg via ORAL
  Filled 2019-06-22 (×5): qty 1

## 2019-06-22 MED ORDER — METOPROLOL TARTRATE 50 MG PO TABS
50.0000 mg | ORAL_TABLET | Freq: Two times a day (BID) | ORAL | Status: DC
  Administered 2019-06-22 – 2019-06-23 (×2): 50 mg via ORAL
  Filled 2019-06-22 (×2): qty 1

## 2019-06-22 MED ORDER — MAGNESIUM SULFATE 2 GM/50ML IV SOLN: 2.0000 g | Freq: Once | INTRAVENOUS | Status: DC

## 2019-06-22 MED ORDER — HYDROMORPHONE HCL 1 MG/ML IJ SOLN
0.5000 mg | INTRAMUSCULAR | Status: DC | PRN
  Administered 2019-06-22 – 2019-06-24 (×4): 0.5 mg via INTRAVENOUS
  Filled 2019-06-22 (×4): qty 0.5

## 2019-06-22 MED ORDER — POVIDONE-IODINE 10 % EX SWAB
2.0000 "application " | Freq: Once | CUTANEOUS | Status: AC
  Administered 2019-06-22: 2 via TOPICAL

## 2019-06-22 MED ORDER — MORPHINE SULFATE (PF) 4 MG/ML IV SOLN
4.0000 mg | Freq: Once | INTRAVENOUS | Status: AC
  Administered 2019-06-22: 4 mg via INTRAVENOUS
  Filled 2019-06-22: qty 1

## 2019-06-22 MED ORDER — TRANEXAMIC ACID-NACL 1000-0.7 MG/100ML-% IV SOLN
1000.0000 mg | INTRAVENOUS | Status: AC
  Administered 2019-06-22: 1000 mg via INTRAVENOUS
  Filled 2019-06-22 (×2): qty 100

## 2019-06-22 MED ORDER — FENTANYL CITRATE (PF) 100 MCG/2ML IJ SOLN
INTRAMUSCULAR | Status: AC
  Filled 2019-06-22: qty 2

## 2019-06-22 MED ORDER — FENTANYL CITRATE (PF) 100 MCG/2ML IJ SOLN
INTRAMUSCULAR | Status: DC | PRN
  Administered 2019-06-22 (×2): 25 ug via INTRAVENOUS
  Administered 2019-06-22: 50 ug via INTRAVENOUS

## 2019-06-22 MED ORDER — ALLOPURINOL 100 MG PO TABS
100.0000 mg | ORAL_TABLET | Freq: Two times a day (BID) | ORAL | Status: DC
  Administered 2019-06-22 – 2019-06-26 (×9): 100 mg via ORAL
  Filled 2019-06-22 (×9): qty 1

## 2019-06-22 MED ORDER — PHENOL 1.4 % MT LIQD
1.0000 | OROMUCOSAL | Status: DC | PRN
  Filled 2019-06-22: qty 177

## 2019-06-22 MED ORDER — SUGAMMADEX SODIUM 200 MG/2ML IV SOLN
INTRAVENOUS | Status: DC | PRN
  Administered 2019-06-22: 120 mg via INTRAVENOUS

## 2019-06-22 MED ORDER — LACTATED RINGERS IV SOLN
INTRAVENOUS | Status: DC
  Administered 2019-06-22: 08:00:00 via INTRAVENOUS

## 2019-06-22 MED ORDER — OXYCODONE HCL 5 MG/5ML PO SOLN: 5.0000 mg | Freq: Once | ORAL | Status: DC | PRN

## 2019-06-22 MED ORDER — PROPOFOL 500 MG/50ML IV EMUL
INTRAVENOUS | Status: DC | PRN
  Administered 2019-06-22: 125 ug/kg/min via INTRAVENOUS

## 2019-06-22 MED ORDER — LIDOCAINE 2% (20 MG/ML) 5 ML SYRINGE
INTRAMUSCULAR | Status: AC
  Filled 2019-06-22: qty 10

## 2019-06-22 MED ORDER — ENOXAPARIN SODIUM 30 MG/0.3ML ~~LOC~~ SOLN
30.0000 mg | SUBCUTANEOUS | Status: DC
  Administered 2019-06-23 – 2019-06-26 (×4): 30 mg via SUBCUTANEOUS
  Filled 2019-06-22 (×5): qty 0.3

## 2019-06-22 MED ORDER — SENNA 8.6 MG PO TABS
1.0000 | ORAL_TABLET | Freq: Two times a day (BID) | ORAL | Status: DC
  Administered 2019-06-22 – 2019-06-26 (×8): 8.6 mg via ORAL
  Filled 2019-06-22 (×9): qty 1

## 2019-06-22 MED ORDER — MIDAZOLAM HCL 2 MG/2ML IJ SOLN
INTRAMUSCULAR | Status: AC
  Filled 2019-06-22: qty 2

## 2019-06-22 MED ORDER — SODIUM CHLORIDE 0.9 % IV SOLN
1.0000 g | INTRAVENOUS | Status: DC
  Administered 2019-06-23 – 2019-06-26 (×4): 1 g via INTRAVENOUS
  Filled 2019-06-22 (×4): qty 1

## 2019-06-22 MED ORDER — METOCLOPRAMIDE HCL 5 MG/ML IJ SOLN: 5.0000 mg | Freq: Three times a day (TID) | INTRAMUSCULAR | Status: DC | PRN

## 2019-06-22 MED ORDER — GABAPENTIN 300 MG PO CAPS
300.0000 mg | ORAL_CAPSULE | Freq: Two times a day (BID) | ORAL | Status: DC
  Administered 2019-06-22 – 2019-06-26 (×9): 300 mg via ORAL
  Filled 2019-06-22 (×9): qty 1

## 2019-06-22 MED ORDER — RALOXIFENE HCL 60 MG PO TABS
60.0000 mg | ORAL_TABLET | Freq: Every day | ORAL | Status: DC
  Administered 2019-06-23 – 2019-06-24 (×2): 60 mg via ORAL
  Filled 2019-06-22 (×3): qty 1

## 2019-06-22 MED ORDER — FENTANYL CITRATE (PF) 100 MCG/2ML IJ SOLN: 25.0000 ug | INTRAMUSCULAR | Status: DC | PRN

## 2019-06-22 MED ORDER — ENSURE PRE-SURGERY PO LIQD
296.0000 mL | Freq: Once | ORAL | Status: DC
  Filled 2019-06-22: qty 296

## 2019-06-22 MED ORDER — ONDANSETRON HCL 4 MG/2ML IJ SOLN: 4.0000 mg | Freq: Once | INTRAMUSCULAR | Status: DC | PRN

## 2019-06-22 MED ORDER — ONDANSETRON HCL 4 MG/2ML IJ SOLN
INTRAMUSCULAR | Status: DC | PRN
  Administered 2019-06-22: 4 mg via INTRAVENOUS

## 2019-06-22 MED ORDER — OXYCODONE HCL 5 MG PO TABS: 5.0000 mg | ORAL_TABLET | Freq: Once | ORAL | Status: DC | PRN

## 2019-06-22 SURGICAL SUPPLY — 44 items
ADH SKN CLS APL DERMABOND .7 (GAUZE/BANDAGES/DRESSINGS) ×2
APL PRP STRL LF DISP 70% ISPRP (MISCELLANEOUS) ×1
BAG SPEC THK2 15X12 ZIP CLS (MISCELLANEOUS)
BAG ZIPLOCK 12X15 (MISCELLANEOUS) IMPLANT
BIT DRILL CANN LG 4.3MM (BIT) IMPLANT
CHLORAPREP W/TINT 26 (MISCELLANEOUS) ×2 IMPLANT
COVER PERINEAL POST (MISCELLANEOUS) ×2 IMPLANT
COVER SURGICAL LIGHT HANDLE (MISCELLANEOUS) ×2 IMPLANT
COVER WAND RF STERILE (DRAPES) IMPLANT
DERMABOND ADVANCED (GAUZE/BANDAGES/DRESSINGS) ×2
DERMABOND ADVANCED .7 DNX12 (GAUZE/BANDAGES/DRESSINGS) ×2 IMPLANT
DRAPE C-ARM 42X120 X-RAY (DRAPES) ×2 IMPLANT
DRAPE C-ARMOR (DRAPES) ×2 IMPLANT
DRAPE IMP U-DRAPE 54X76 (DRAPES) ×4 IMPLANT
DRAPE SHEET LG 3/4 BI-LAMINATE (DRAPES) ×4 IMPLANT
DRAPE STERI IOBAN 125X83 (DRAPES) ×2 IMPLANT
DRAPE U-SHAPE 47X51 STRL (DRAPES) ×4 IMPLANT
DRILL BIT CANN LG 4.3MM (BIT) ×2
DRSG AQUACEL AG ADV 3.5X 6 (GAUZE/BANDAGES/DRESSINGS) ×1 IMPLANT
DRSG MEPILEX BORDER 4X4 (GAUZE/BANDAGES/DRESSINGS) ×5 IMPLANT
DRSG MEPILEX BORDER 4X8 (GAUZE/BANDAGES/DRESSINGS) IMPLANT
ELECT BLADE TIP CTD 4 INCH (ELECTRODE) IMPLANT
FACESHIELD WRAPAROUND (MASK) ×4 IMPLANT
FACESHIELD WRAPAROUND OR TEAM (MASK) ×2 IMPLANT
GAUZE SPONGE 4X4 12PLY STRL (GAUZE/BANDAGES/DRESSINGS) ×2 IMPLANT
GLOVE BIO SURGEON STRL SZ8.5 (GLOVE) ×4 IMPLANT
GLOVE BIOGEL PI IND STRL 8.5 (GLOVE) ×1 IMPLANT
GLOVE BIOGEL PI INDICATOR 8.5 (GLOVE) ×1
GOWN SPEC L3 XXLG W/TWL (GOWN DISPOSABLE) ×2 IMPLANT
GUIDEPIN 3.2X17.5 THRD DISP (PIN) ×1 IMPLANT
GUIDEWIRE BALL NOSE 80CM (WIRE) ×1 IMPLANT
HFN 125 DEG 11MM X 180MM (Orthopedic Implant) ×1 IMPLANT
HIP FRA NAIL LAG SCREW 10.5X90 (Orthopedic Implant) ×2 IMPLANT
KIT BASIN OR (CUSTOM PROCEDURE TRAY) ×2 IMPLANT
KIT TURNOVER KIT A (KITS) IMPLANT
MANIFOLD NEPTUNE II (INSTRUMENTS) ×2 IMPLANT
MARKER SKIN DUAL TIP RULER LAB (MISCELLANEOUS) ×2 IMPLANT
PACK GENERAL/GYN (CUSTOM PROCEDURE TRAY) ×2 IMPLANT
SCREW BONE CORTICAL 5.0X32 (Screw) ×1 IMPLANT
SCREW LAG HIP FRA NAIL 10.5X90 (Orthopedic Implant) IMPLANT
SUT MNCRL AB 3-0 PS2 18 (SUTURE) ×2 IMPLANT
SUT MON AB 2-0 CT1 36 (SUTURE) ×1 IMPLANT
SUT VIC AB 1 CT1 36 (SUTURE) ×2 IMPLANT
TOWEL OR 17X26 10 PK STRL BLUE (TOWEL DISPOSABLE) ×2 IMPLANT

## 2019-06-22 NOTE — Anesthesia Procedure Notes (Signed)
Procedure Name: Intubation Date/Time: 06/22/2019 8:34 AM Performed by: Silas Sacramento, CRNA Pre-anesthesia Checklist: Patient identified, Emergency Drugs available, Suction available and Patient being monitored Patient Re-evaluated:Patient Re-evaluated prior to induction Oxygen Delivery Method: Circle system utilized Preoxygenation: Pre-oxygenation with 100% oxygen Induction Type: IV induction and Rapid sequence Ventilation: Mask ventilation without difficulty Laryngoscope Size: Mac and 3 Grade View: Grade I Tube type: Oral Tube size: 7.0 mm Number of attempts: 1 Airway Equipment and Method: Stylet and Oral airway Placement Confirmation: ETT inserted through vocal cords under direct vision,  positive ETCO2 and breath sounds checked- equal and bilateral Secured at: 21 cm Tube secured with: Tape Dental Injury: Teeth and Oropharynx as per pre-operative assessment

## 2019-06-22 NOTE — Plan of Care (Signed)
Pt with History of Dementia

## 2019-06-22 NOTE — Discharge Instructions (Signed)
 Dr. Aleeha Boline Adult Hip & Knee Specialist [facility] [address] [place], [place] [address] [phone]   POSTOPERATIVE DIRECTIONS    Hip Rehabilitation, Guidelines Following Surgery   WEIGHT BEARING Weight bearing as tolerated with assist device (walker, cane, etc) as directed, use it as long as suggested by your surgeon or therapist, typically at least 4-6 weeks.   HOME CARE INSTRUCTIONS  Remove items at home which could result in a fall. This includes throw rugs or furniture in walking pathways.  Continue medications as instructed at time of discharge.  You may have some home medications which will be placed on hold until you complete the course of blood thinner medication.  4 days after discharge, you may start showering. No tub baths or soaking your incisions. Do not put on socks or shoes without following the instructions of your caregivers.   Sit on chairs with arms. Use the chair arms to help push yourself up when arising.  Arrange for the use of a toilet seat elevator so you are not sitting low.   Walk with walker as instructed.  You may resume a sexual relationship in one month or when given the OK by your caregiver.  Use walker as long as suggested by your caregivers.  Avoid periods of inactivity such as sitting longer than an hour when not asleep. This helps prevent blood clots.  You may return to work once you are cleared by your surgeon.  Do not drive a car for 6 weeks or until released by your surgeon.  Do not drive while taking narcotics.  Wear elastic stockings for two weeks following surgery during the day but you may remove then at night.  Make sure you keep all of your appointments after your operation with all of your doctors and caregivers. You should call the office at the above phone number and make an appointment for approximately two weeks after the date of your surgery. Please pick up a stool softener and laxative  for home use as long as you are requiring pain medications.  ICE to the affected hip every three hours for 30 minutes at a time and then as needed for pain and swelling. Continue to use ice on the hip for pain and swelling from surgery. You may notice swelling that will progress down to the foot and ankle.  This is normal after surgery.  Elevate the leg when you are not up walking on it.   It is important for you to complete the blood thinner medication as prescribed by your doctor.  Continue to use the breathing machine which will help keep your temperature down.  It is common for your temperature to cycle up and down following surgery, especially at night when you are not up moving around and exerting yourself.  The breathing machine keeps your lungs expanded and your temperature down.  RANGE OF MOTION AND STRENGTHENING EXERCISES  These exercises are designed to help you keep full movement of your hip joint. Follow your caregiver's or physical therapist's instructions. Perform all exercises about fifteen times, three times per day or as directed. Exercise both hips, even if you have had only one joint replacement. These exercises can be done on a training (exercise) mat, on the floor, on a table or on a bed. Use whatever works the best and is most comfortable for you. Use music or television while you are exercising so that the exercises are a pleasant break in your day. This   will make your life better with the exercises acting as a break in routine you can look forward to.  Lying on your back, slowly slide your foot toward your buttocks, raising your knee up off the floor. Then slowly slide your foot back down until your leg is straight again.  Lying on your back spread your legs as far apart as you can without causing discomfort.  Lying on your side, raise your upper leg and foot straight up from the floor as far as is comfortable. Slowly lower the leg and repeat.  Lying on your back, tighten up the  muscle in the front of your thigh (quadriceps muscles). You can do this by keeping your leg straight and trying to raise your heel off the floor. This helps strengthen the largest muscle supporting your knee.  Lying on your back, tighten up the muscles of your buttocks both with the legs straight and with the knee bent at a comfortable angle while keeping your heel on the floor.   SKILLED REHAB INSTRUCTIONS: If the patient is transferred to a skilled rehab facility following release from the hospital, a list of the current medications will be sent to the facility for the patient to continue.  When discharged from the skilled rehab facility, please have the facility set up the patient's Home Health Physical Therapy prior to being released. Also, the skilled facility will be responsible for providing the patient with their medications at time of release from the facility to include their pain medication and their blood thinner medication. If the patient is still at the rehab facility at time of the two week follow up appointment, the skilled rehab facility will also need to assist the patient in arranging follow up appointment in our office and any transportation needs.  MAKE SURE YOU:  Understand these instructions.  Will watch your condition.  Will get help right away if you are not doing well or get worse.  Pick up stool softner and laxative for home use following surgery while on pain medications. Daily dry dressing changes as needed. In 4 days, you may remove your dressings and begin taking showers - no tub baths or soaking the incisions. Continue to use ice for pain and swelling after surgery. Do not use any lotions or creams on the incision until instructed by your surgeon.   

## 2019-06-22 NOTE — Transfer of Care (Signed)
Immediate Anesthesia Transfer of Care Note  Patient: Michelle Gregory  Procedure(s) Performed: INTRAMEDULLARY (IM) NAIL INTERTROCHANTRIC (Left )  Patient Location: PACU  Anesthesia Type:General  Level of Consciousness: awake, patient cooperative and responds to stimulation  Airway & Oxygen Therapy: Patient Spontanous Breathing and Patient connected to face mask oxygen  Post-op Assessment: Report given to RN and Post -op Vital signs reviewed and stable  Post vital signs: Reviewed and stable  Last Vitals:  Vitals Value Taken Time  BP 151/70 06/22/19 1030  Temp 36.4 C 06/22/19 1030  Pulse 81 06/22/19 1033  Resp 22 06/22/19 1035  SpO2 99 % 06/22/19 1033  Vitals shown include unvalidated device data.  Last Pain:  Vitals:   06/22/19 1030  TempSrc:   PainSc: (P) 0-No pain      Patients Stated Pain Goal: 4 (22/84/06 9861)  Complications: No apparent anesthesia complications

## 2019-06-22 NOTE — Telephone Encounter (Signed)
[name](daughter) calling to give FYI that the patient is currently admitted to [facility]  Pt fell late yesterday afternoon and broke her hip, pt had emergency surgery this morning to repair a displaced left intertrochanteric femur fracture.   Disposition Plan: Admit for orthopedic surgery intervention. Consults called:  Dr. Lyla Glassing (orthopedic surgery) Admission status: Inpatient/telemetry.  Will send to Dr Glori Bickers as Juluis Rainier and for further instruction per patient daughter request.

## 2019-06-22 NOTE — Anesthesia Postprocedure Evaluation (Signed)
Anesthesia Post Note  Patient: Michelle Gregory  Procedure(s) Performed: INTRAMEDULLARY (IM) NAIL INTERTROCHANTRIC (Left )     Patient location during evaluation: PACU Anesthesia Type: General Level of consciousness: awake Pain management: pain level controlled Vital Signs Assessment: post-procedure vital signs reviewed and stable Respiratory status: spontaneous breathing, nonlabored ventilation, respiratory function stable and patient connected to nasal cannula oxygen Cardiovascular status: blood pressure returned to baseline and stable Postop Assessment: no apparent nausea or vomiting Anesthetic complications: no    Last Vitals:  Vitals:   06/22/19 1330 06/22/19 1446  BP: 105/64 95/62  Pulse: 70 93  Resp: 16 18  Temp: 36.4 C (!) 36.3 C  SpO2: 94% 100%    Last Pain:  Vitals:   06/22/19 1446  TempSrc: Oral  PainSc:                  Audry Pili

## 2019-06-22 NOTE — ED Notes (Signed)
ED TO INPATIENT HANDOFF REPORT  Name/Age/Gender Marc Morgans 85 y.o. female  Code Status Code Status History    Date Active Date Inactive Code Status Order ID Comments User Context   04/03/2018 1635 04/07/2018 1711 Full Code [id number]  Mariel Aloe, MD Inpatient   10/13/2017 1713 10/20/2017 1459 Full Code [id number]  Demetrios Loll, MD Inpatient   Advance Care Planning Activity      Home/SNF/Other Home  Chief Complaint Fall  Level of Care/Admitting Diagnosis ED Disposition    ED Disposition Condition [place]: [facility] [[id number]]  Level of Care: Telemetry [5]  Admit to tele based on following criteria: Monitor QTC interval  Covid Evaluation: Confirmed COVID Negative  Diagnosis: Closed left hip fracture, initial encounter [facility]) [[id number]]  Admitting Physician: Reubin Milan [[id number]]  Attending Physician: Reubin Milan [[id number]]  Estimated length of stay: past midnight tomorrow  Certification:: I certify this patient will need inpatient services for at least 2 midnights  PT Class (Do Not Modify): Inpatient [101]  PT Acc Code (Do Not Modify): Private [1]       Medical History Past Medical History:  Diagnosis Date  . Allergic rhinitis   . Anxiety   . C. difficile diarrhea 10/2017  . Cancer ([place])   . Depression   . GERD (gastroesophageal reflux disease)   . History of colon cancer   . History of recurrent UTIs   . Hyperlipidemia   . Hypertension   . Osteoarthritis   . Spinal compression fracture ([place])   . Vertigo     Allergies Allergies  Allergen Reactions  . Amoxicillin-Pot Clavulanate Diarrhea    Has patient had a PCN reaction causing immediate rash, facial/tongue/throat swelling, SOB or lightheadedness with hypotension: Yes Has patient had a PCN reaction causing severe rash involving mucus membranes or skin necrosis: No Has patient had a PCN reaction that required hospitalization: No Has patient had a PCN  reaction occurring within the last 10 years: No If all of the above answers are "NO", then may proceed with Cephalosporin use.   Marland Kitchen Keflex [Cephalexin] Diarrhea  . Septra [Sulfamethoxazole-Trimethoprim] Nausea Only    IV Location/Drains/Wounds Patient Lines/Drains/Airways Status   Active Line/Drains/Airways    Name:   Placement date:   Placement time:   Site:   Days:   Peripheral IV 06/21/19 Anterior;Left Hand   06/21/19    -    Hand   1          Labs/Imaging Results for orders placed or performed during the hospital encounter of 06/21/19 (from the past 48 hour(s))  Urinalysis, Routine w reflex microscopic     Status: Abnormal   Collection Time: 06/21/19  6:04 PM  Result Value Ref Range   Color, Urine AMBER (A) YELLOW    Comment: BIOCHEMICALS MAY BE AFFECTED BY COLOR   APPearance CLOUDY (A) CLEAR   Specific Gravity, Urine 1.018 1.005 - 1.030   pH 7.0 5.0 - 8.0   Glucose, UA NEGATIVE NEGATIVE mg/dL   Hgb urine dipstick NEGATIVE NEGATIVE   Bilirubin Urine NEGATIVE NEGATIVE   Ketones, ur 5 (A) NEGATIVE mg/dL   Protein, ur 100 (A) NEGATIVE mg/dL   Nitrite NEGATIVE NEGATIVE   Leukocytes,Ua LARGE (A) NEGATIVE   RBC / HPF 0-5 0 - 5 RBC/hpf   WBC, UA >50 (H) 0 - 5 WBC/hpf   Bacteria, UA RARE (A) NONE SEEN   Squamous Epithelial / LPF 0-5 0 - 5  Comment: Performed at [facility], [place] [address], [place], [place] [address]  SARS Coronavirus 2 [facility] order, Performed in [facility] hospital lab) Nasopharyngeal Nasopharyngeal Swab     Status: None   Collection Time: 06/21/19  6:05 PM   Specimen: Nasopharyngeal Swab  Result Value Ref Range   SARS Coronavirus 2 NEGATIVE NEGATIVE    Comment: (NOTE) If result is NEGATIVE SARS-CoV-2 target nucleic acids are NOT DETECTED. The SARS-CoV-2 RNA is generally detectable in upper and lower  respiratory specimens during the acute phase of infection. The lowest  concentration of SARS-CoV-2 viral copies this assay can  detect is 250  copies / mL. A negative result does not preclude SARS-CoV-2 infection  and should not be used as the sole basis for treatment or other  patient management decisions.  A negative result may occur with  improper specimen collection / handling, submission of specimen other  than nasopharyngeal swab, presence of viral mutation(s) within the  areas targeted by this assay, and inadequate number of viral copies  (<250 copies / mL). A negative result must be combined with clinical  observations, patient history, and epidemiological information. If result is POSITIVE SARS-CoV-2 target nucleic acids are DETECTED. The SARS-CoV-2 RNA is generally detectable in upper and lower  respiratory specimens dur ing the acute phase of infection.  Positive  results are indicative of active infection with SARS-CoV-2.  Clinical  correlation with patient history and other diagnostic information is  necessary to determine patient infection status.  Positive results do  not rule out bacterial infection or co-infection with other viruses. If result is PRESUMPTIVE POSTIVE SARS-CoV-2 nucleic acids MAY BE PRESENT.   A presumptive positive result was obtained on the submitted specimen  and confirmed on repeat testing.  While 2019 novel coronavirus  (SARS-CoV-2) nucleic acids may be present in the submitted sample  additional confirmatory testing may be necessary for epidemiological  and / or clinical management purposes  to differentiate between  SARS-CoV-2 and other Sarbecovirus currently known to infect humans.  If clinically indicated additional testing with an alternate test  methodology [phone]) is advised. The SARS-CoV-2 RNA is generally  detectable in upper and lower respiratory sp ecimens during the acute  phase of infection. The expected result is Negative. Fact Sheet for Patients:  StrictlyIdeas.no Fact Sheet for Healthcare  Providers: BankingDealers.co.za This test is not yet approved or cleared by the [place] FDA and has been authorized for detection and/or diagnosis of SARS-CoV-2 by FDA under an Emergency Use Authorization (EUA).  This EUA will remain in effect (meaning this test can be used) for the duration of the COVID-19 declaration under Section 564(b)(1) of the Act, 21 U.S.C. section 360bbb-3(b)(1), unless the authorization is terminated or revoked sooner. Performed at [facility], [place] [address] [place], [place] [address]   CBC WITH DIFFERENTIAL     Status: Abnormal   Collection Time: 06/21/19  7:11 PM  Result Value Ref Range   WBC 13.7 (H) 4.0 - 10.5 K/uL   RBC 3.65 (L) 3.87 - 5.11 MIL/uL   Hemoglobin 12.1 12.0 - 15.0 g/dL   HCT 36.4 36.0 - 46.0 %   MCV 99.7 80.0 - 100.0 fL   MCH 33.2 26.0 - 34.0 pg   MCHC 33.2 30.0 - 36.0 g/dL   RDW 12.7 11.5 - 15.5 %   Platelets 142 (L) 150 - 400 K/uL    Comment: REPEATED TO VERIFY PLATELET COUNT CONFIRMED BY SMEAR SPECIMEN CHECKED FOR CLOTS  Immature Platelet Fraction may be clinically indicated, consider ordering this additional test [id number]    nRBC 0.0 0.0 - 0.2 %   Neutrophils Relative % 86 %   Neutro Abs 11.8 (H) 1.7 - 7.7 K/uL   Lymphocytes Relative 7 %   Lymphs Abs 0.9 0.7 - 4.0 K/uL   Monocytes Relative 7 %   Monocytes Absolute 0.9 0.1 - 1.0 K/uL   Eosinophils Relative 0 %   Eosinophils Absolute 0.0 0.0 - 0.5 K/uL   Basophils Relative 0 %   Basophils Absolute 0.0 0.0 - 0.1 K/uL   Immature Granulocytes 0 %   Abs Immature Granulocytes 0.05 0.00 - 0.07 K/uL    Comment: Performed at [facility], [place] [address] [place], [place] [address]  Type and screen [place]     Status: None   Collection Time: 06/21/19  7:11 PM  Result Value Ref Range   ABO/RH(D) O POS    Antibody Screen NEG    Sample Expiration      06/24/2019,2359 Performed at [facility], [place] [address] [place], [place] [address]   CK     Status: None   Collection Time: 06/21/19  7:11 PM  Result Value Ref Range   Total CK 39 38 - 234 U/L    Comment: Performed at [facility], [place] [address] [place], [place] [address]  Comprehensive metabolic panel     Status: Abnormal   Collection Time: 06/21/19  7:11 PM  Result Value Ref Range   Sodium 138 135 - 145 mmol/L   Potassium 3.9 3.5 - 5.1 mmol/L   Chloride 104 98 - 111 mmol/L   CO2 22 22 - 32 mmol/L   Glucose, Bld 145 (H) 70 - 99 mg/dL   BUN 22 8 - 23 mg/dL   Creatinine, Ser 0.74 0.44 - 1.00 mg/dL   Calcium 8.6 (L) 8.9 - 10.3 mg/dL   Total Protein 5.9 (L) 6.5 - 8.1 g/dL   Albumin 3.7 3.5 - 5.0 g/dL   AST 24 15 - 41 U/L   ALT 14 0 - 44 U/L   Alkaline Phosphatase 44 38 - 126 U/L   Total Bilirubin 1.0 0.3 - 1.2 mg/dL   GFR calc non Af Amer >60 >60 mL/min   GFR calc Af Amer >60 >60 mL/min   Anion gap 12 5 - 15    Comment: Performed at [facility], [place] [address], [place], [place] [address]  ABO/Rh     Status: None   Collection Time: 06/21/19  7:11 PM  Result Value Ref Range   ABO/RH(D)      O POS Performed at [facility], [place] [address] [place], [place] [address]   Magnesium     Status: Abnormal   Collection Time: 06/21/19  7:11 PM  Result Value Ref Range   Magnesium 1.5 (L) 1.7 - 2.4 mg/dL    Comment: Performed at [facility], [place] [address], [place], [place] [address]  Phosphorus     Status: None   Collection Time: 06/21/19  7:11 PM  Result Value Ref Range   Phosphorus 3.0 2.5 - 4.6 mg/dL    Comment: Performed at [facility], [place] [address], [place], [place] [address]   Dg Hip Unilat With Pelvis 2-3 Views Left  Result Date: 06/21/2019 CLINICAL DATA:  85 year old who fell earlier this evening and was found by her family on the floor  at home. LEFT hip injury with pain. Initial encounter.  EXAM: DG HIP (WITH OR WITHOUT PELVIS) 2-3V LEFT COMPARISON:  04/02/2018. FINDINGS: Comminuted intertrochanteric LEFT femoral neck fracture into multiple parts. Hip joint anatomically aligned with severe SUPERIOR joint space narrowing. Included AP pelvis demonstrates no fractures elsewhere. Sacroiliac joints and symphysis pubis anatomically aligned. Severe joint space narrowing involving the contralateral RIGHT hip. IMPRESSION: 1. Acute traumatic comminuted intertrochanteric LEFT femoral neck fracture. 2. Anatomic alignment the LEFT hip joint with severe SUPERIOR joint space narrowing. 3. Severe osteoarthritis involving the contralateral RIGHT hip. Electronically Signed   By: Evangeline Dakin M.D.   On: 06/21/2019 18:46    Pending Labs Unresulted Labs (From admission, onward)    Start     Ordered   06/21/19 [place]  Once,   STAT     06/21/19 1919   06/21/19 1804  Urine culture  ONCE - STAT,   STAT     06/21/19 1805   Signed and Held  CBC  Tomorrow morning,   R     Signed and Held   Signed and Held  Comprehensive metabolic panel  Tomorrow morning,   R     Signed and Held   Signed and Held  Protime-INR  Tomorrow morning,   R     Signed and Held          Vitals/Pain Today's Vitals   06/21/19 1913 06/21/19 1948 06/21/19 2030 06/21/19 2310  BP: 104/65  137/86 128/76  Pulse: 75  93 79  Resp: 17  (!) 21 19  Temp:      TempSrc:      SpO2: 97%  100% 98%  PainSc:  10-Worst pain ever      Isolation Precautions No active isolations  Medications Medications  cefTRIAXone (ROCEPHIN) 1 g in sodium chloride 0.9 % 100 mL IVPB (has no administration in time range)  fentaNYL (SUBLIMAZE) injection 50 mcg (50 mcg Intravenous Given 06/21/19 1824)  morphine 4 MG/ML injection 4 mg (4 mg Intravenous Given 06/21/19 1949)  sodium chloride 0.9 % bolus 500 mL (0 mLs Intravenous Stopped 06/21/19 2139)  sodium chloride 0.9 % bolus 500 mL (500 mLs Intravenous New Bag/Given 06/21/19 2137)     Mobility non-ambulatory

## 2019-06-22 NOTE — Consult Note (Signed)
ORTHOPAEDIC CONSULTATION  REQUESTING PHYSICIAN: Dhungel, Flonnie Overman, MD  PCP:  Abner Greenspan, MD  Chief Complaint: Left intertrochanteric femur fracture  HPI: Michelle Gregory is a 85 y.o. female with a history of dementia and osteoporosis with multiple vertebral compression fractures under the care of Dr. Nelva Bush.  She lives independently with her husband.  She had a ground-level fall yesterday, and injured her left hip.  She had left hip pain and inability to weight-bear.  She was brought to the emergency department at [facility], where x-rays revealed a displaced left intertrochanteric femur fracture.  She was admitted by the hospitalist service for perioperative or stratification and medical optimization.  Orthopedic surgery consultation was placed for management of the left intertrochanteric femur fracture.  Past Medical History:  Diagnosis Date  . Allergic rhinitis   . Anxiety   . C. difficile diarrhea 10/2017  . Cancer ([place])   . Depression   . GERD (gastroesophageal reflux disease)   . History of colon cancer   . History of recurrent UTIs   . Hyperlipidemia   . Hypertension   . Osteoarthritis   . Spinal compression fracture ([place])   . Vertigo    Past Surgical History:  Procedure Laterality Date  . APPENDECTOMY    . CATARACT EXTRACTION    . COLON RESECTION    . TONSILLECTOMY     Social History   Socioeconomic History  . Marital status: Widowed    Spouse name: Not on file  . Number of children: 1  . Years of education: Not on file  . Highest education level: Not on file  Occupational History  . Occupation: retired  Scientific laboratory technician  . Financial resource strain: Not on file  . Food insecurity    Worry: Not on file    Inability: Not on file  . Transportation needs    Medical: Not on file    Non-medical: Not on file  Tobacco Use  . Smoking status: Never Smoker  . Smokeless tobacco: Never Used  Substance and Sexual Activity  . Alcohol use: No   Alcohol/week: 0.0 standard drinks  . Drug use: No  . Sexual activity: Never  Lifestyle  . Physical activity    Days per week: Not on file    Minutes per session: Not on file  . Stress: Not on file  Relationships  . Social Herbalist on phone: Not on file    Gets together: Not on file    Attends religious service: Not on file    Active member of club or organization: Not on file    Attends meetings of clubs or organizations: Not on file    Relationship status: Not on file  Other Topics Concern  . Not on file  Social History Narrative  . Not on file   Family History  Problem Relation Age of Onset  . Prostate cancer Father   . Heart attack Mother   . Gout Mother    Allergies  Allergen Reactions  . Amoxicillin-Pot Clavulanate Diarrhea    Has patient had a PCN reaction causing immediate rash, facial/tongue/throat swelling, SOB or lightheadedness with hypotension: Yes Has patient had a PCN reaction causing severe rash involving mucus membranes or skin necrosis: No Has patient had a PCN reaction that required hospitalization: No Has patient had a PCN reaction occurring within the last 10 years: No If all of the above answers are "NO", then may proceed with Cephalosporin use.   Marland Kitchen  Keflex [Cephalexin] Diarrhea  . Septra [Sulfamethoxazole-Trimethoprim] Nausea Only   Prior to Admission medications   Medication Sig Start Date End Date Taking? Authorizing Provider  allopurinol (ZYLOPRIM) 100 MG tablet Take 2 tablets (200 mg total) by mouth daily. Patient taking differently: Take 100 mg by mouth 2 (two) times daily.  01/23/19  Yes Tower, [name], MD  ALPRAZolam Duanne Moron) 0.5 MG tablet 1/2-1  pill by mouth up to twice daily as needed for anxiety or sleep 03/20/19  Yes Tower, [name], MD  Ascorbic Acid (VITAMIN C) 100 MG tablet Take 100 mg by mouth daily.   Yes [provider]  aspirin 81 MG tablet Take 81 mg by mouth daily.     Yes [provider]   calcium-vitamin D (CALCIUM 500 +D) 500 MG tablet Take 1 tablet by mouth daily.    Yes [provider]  CRANBERRY PO Take 1 capsule by mouth 2 (two) times daily.   Yes [provider]  gabapentin (NEURONTIN) 300 MG capsule Take 300 mg by mouth 2 (two) times daily.  09/06/17  Yes [provider]  glucosamine-chondroitin 500-400 MG tablet Take 1 tablet by mouth daily.   Yes [provider]  HYDROcodone-acetaminophen (NORCO) 10-325 MG tablet Take 1 tablet by mouth 3 (three) times daily as needed for moderate pain.   Yes [provider]  losartan (COZAAR) 100 MG tablet Take 1 tablet (100 mg total) by mouth daily. 01/23/19  Yes Tower, [name], MD  meclizine (ANTIVERT) 25 MG tablet Take 1 tablet (25 mg total) by mouth 3 (three) times daily as needed for dizziness. 12/07/18  Yes Tower, [name], MD  metoprolol tartrate (LOPRESSOR) 50 MG tablet Take 1 tablet (50 mg total) by mouth 2 (two) times daily. 01/23/19  Yes Tower, [name], MD  multivitamin [facility]) per tablet Take 1 tablet by mouth daily.     Yes [provider]  PARoxetine (PAXIL) 30 MG tablet Take 1 tablet (30 mg total) by mouth every morning. 01/23/19  Yes Tower, [name], MD  Probiotic Product (PROBIOTIC DAILY PO) Take 1 tablet by mouth daily.   Yes [provider]  raloxifene (EVISTA) 60 MG tablet Take 1 tablet (60 mg total) by mouth daily. 01/23/19  Yes Tower, [name], MD  HYDROcodone-acetaminophen (NORCO/VICODIN) 5-325 MG tablet Take 1 tablet by mouth every 4 (four) hours as needed for moderate pain. Patient not taking: Reported on 06/21/2019 04/07/18   Velvet Bathe, MD  nitrofurantoin, macrocrystal-monohydrate, (MACROBID) 100 MG capsule Take 1 capsule (100 mg total) by mouth 2 (two) times daily. Patient not taking: Reported on 06/21/2019 03/20/19   Tower, [name], MD  valACYclovir (VALTREX) 1000 MG tablet Take 1 tablet (1,000 mg total) by mouth 3 (three) times daily. Patient not taking:  Reported on 06/21/2019 01/23/19   Abner Greenspan, MD  Glucosamine-Chondroitin 1500-1200 MG/30ML LIQD Take by mouth. As dierected   11/02/19  [provider]   Dg Hip Unilat With Pelvis 2-3 Views Left  Result Date: 06/21/2019 CLINICAL DATA:  85 year old who fell earlier this evening and was found by her family on the floor at home. LEFT hip injury with pain. Initial encounter. EXAM: DG HIP (WITH OR WITHOUT PELVIS) 2-3V LEFT COMPARISON:  04/02/2018. FINDINGS: Comminuted intertrochanteric LEFT femoral neck fracture into multiple parts. Hip joint anatomically aligned with severe SUPERIOR joint space narrowing. Included AP pelvis demonstrates no fractures elsewhere. Sacroiliac joints and symphysis pubis anatomically aligned. Severe joint space narrowing involving the contralateral  RIGHT hip. IMPRESSION: 1. Acute traumatic comminuted intertrochanteric LEFT femoral neck fracture. 2. Anatomic alignment the LEFT hip joint with severe SUPERIOR joint space narrowing. 3. Severe osteoarthritis involving the contralateral RIGHT hip. Electronically Signed   By: Evangeline Dakin M.D.   On: 06/21/2019 18:46    Positive ROS: All other systems have been reviewed and were otherwise negative with the exception of those mentioned in the HPI and as above.  Physical Exam: General: Alert, no acute distress Cardiovascular: No pedal edema Respiratory: No cyanosis, no use of accessory musculature GI: No organomegaly, abdomen is soft and non-tender Skin: No lesions in the area of chief complaint Neurologic: Sensation intact distally Psychiatric: No acute distress, pleasantly confused Lymphatic: No axillary or cervical lymphadenopathy  MUSCULOSKELETAL: Examination of the left lower extremity reveals no skin wounds or lesions.  She is shortened and externally rotated.  She has pain with attempted logrolling of the hip.  She has positive motor function dorsiflexion, plantarflexion, great toe extension.  She reports  intact sensation.  She has palpable pulses.  Assessment: Displaced left intertrochanteric femur fracture. Dementia. Osteoporosis with multiple vertebral compression fractures.  Plan: I discussed the findings with the patient and her daughter.  She has a displaced intertrochanteric femur fracture that requires surgical stabilization.  We discussed that without surgery, the patient will be immobile, which would surely lead to her immediate demise.  We discussed surgical stabilization to allow mobilization.  We discussed the risks, benefits, and alternatives to intramedullary fixation of the left femur.  Please see statement of risk.  We will plan for surgery today.  All questions were solicited and answered.  I spoke to the admitting hospitalist, Dr. Olevia Bowens, over the phone, who agrees that the patient is ready to proceed with surgery.  The risks, benefits, and alternatives were discussed with the patient. There are risks associated with the surgery including, but not limited to, problems with anesthesia (death), infection, differences in leg length/angulation/rotation, fracture of bones, loosening or failure of implants, malunion, nonunion, hematoma (blood accumulation) which may require surgical drainage, blood clots, pulmonary embolism, nerve injury (foot drop), and blood vessel injury. The patient understands these risks and elects to proceed.   Bertram Savin, MD Cell [phone]    06/22/2019 7:49 AM

## 2019-06-22 NOTE — Progress Notes (Signed)
PROGRESS NOTE                                                                                                                                                                                                             Patient Demographics:    Michelle Gregory, is a 85 y.o. female, DOB - 09/14/1923, DZH:[id number]  Admit date - 06/21/2019   Admitting Physician Reubin Milan, MD  Outpatient Primary MD for the patient is Tower, [name], MD  LOS - 1   Chief Complaint  Patient presents with   Fall       Brief Narrative   85 year old female with a history of anxiety, depression, C. difficile, GERD, colon cancer, recurrent UTIs, hypertension, osteoarthritis brought to the ED after sustaining a fall at home with the x-ray showing left intertrochanteric femur fracture.  Patient seen by orthopedics and taken to the OR for intramedullary fixation of the left femur.   Subjective:   Patient seen after returning from the OR.  Denies any pain at present.   Assessment  & Plan :    Principal Problem:   Closed left hip fracture, initial encounter [facility]) Status post left femur intramedullary fixation.  Tolerated surgery well.  Foley in place.  Resume diet.  Pain control with PRN Norco and low-dose Dilaudid if needed. Added bowel regimen. PT in a.m. continue Foley for today and DC in a.m.  Lovenox for DVT prophylaxis.  Active Problems: Hypomagnesemia Replenished  Essential hypertension Blood pressure stable.  Resume home meds  GERD Continue PPI  Gout Continue allopurinol.  Stable  Remaining medical issues are stable.     Code Status : Full  Family Communication  : None at bedside  Disposition Plan  : Possibly needs SNF.  PT eval in a.m.  Barriers For Discharge : Active symptoms  Consults  : Orthopedics (Dr. Lyla Glassing)  Procedures  : IM nailing of left femur fracture  DVT Prophylaxis  :  Lovenox -   Lab  Results  Component Value Date   PLT PLATELET CLUMPS NOTED ON SMEAR, UNABLE TO ESTIMATE 06/22/2019    Antibiotics  :    Anti-infectives (From admission, onward)   Start     Dose/Rate Route Frequency Ordered Stop   06/23/19 0100  cefTRIAXone (ROCEPHIN) 1 g in sodium chloride 0.9 % 100 mL IVPB  1 g 200 mL/hr over 30 Minutes Intravenous Every 24 hours 06/22/19 0727     06/22/19 1430  ceFAZolin (ANCEF) IVPB 2g/100 mL premix     2 g 200 mL/hr over 30 Minutes Intravenous Every 6 hours 06/22/19 1130 06/23/19 0229   06/22/19 0715  ceFAZolin (ANCEF) IVPB 2g/100 mL premix     2 g 200 mL/hr over 30 Minutes Intravenous On call to O.R. 06/22/19 0710 06/22/19 0834   06/22/19 0030  cefTRIAXone (ROCEPHIN) 1 g in sodium chloride 0.9 % 100 mL IVPB     1 g 200 mL/hr over 30 Minutes Intravenous  Once 06/22/19 0017 06/22/19 0131        Objective:   Vitals:   06/22/19 1030 06/22/19 1045 06/22/19 1100 06/22/19 1130  BP: (!) 151/70 130/84 (!) 146/59 110/61  Pulse: 91 78 84 68  Resp: 14 17 15 19   Temp: (!) 97.5 F (36.4 C)  98.1 F (36.7 C) 97.9 F (36.6 C)  TempSrc:    Axillary  SpO2: 100% 98% 98% (!) 85%  Weight:      Height:        Wt Readings from Last 3 Encounters:  06/22/19 54.4 kg  01/23/19 51 kg  09/01/18 53 kg     Intake/Output Summary (Last 24 hours) at 06/22/2019 1223 Last data filed at 06/22/2019 1035 Gross per 24 hour  Intake 1402.49 ml  Output 140 ml  Net 1262.49 ml     Physical Exam  Gen: Elderly thin built female not in distress HEENT: no pallor, moist mucosa, supple neck Chest: clear b/l, no added sounds CVS: N S1&S2, no murmurs, GI: soft, NT, ND, BS+, foley+ Musculoskeletal: warm, no edema, clean dressing over left hip CNS: Alert and oriented    Data Review:    CBC Recent Labs  Lab 06/21/19 1911 06/22/19 1148  WBC 13.7* 14.2*  HGB 12.1 9.7*  HCT 36.4 30.2*  PLT 142* PLATELET CLUMPS NOTED ON SMEAR, UNABLE TO ESTIMATE  MCV 99.7 103.1*  MCH 33.2  33.1  MCHC 33.2 32.1  RDW 12.7 13.0  LYMPHSABS 0.9  --   MONOABS 0.9  --   EOSABS 0.0  --   BASOSABS 0.0  --     Chemistries  Recent Labs  Lab 06/21/19 1911 06/22/19 1149  NA 138  --   K 3.9  --   CL 104  --   CO2 22  --   GLUCOSE 145*  --   BUN 22  --   CREATININE 0.74 1.03*  CALCIUM 8.6*  --   MG 1.5*  --   AST 24  --   ALT 14  --   ALKPHOS 44  --   BILITOT 1.0  --    ------------------------------------------------------------------------------------------------------------------ No results for input(s): CHOL, HDL, LDLCALC, TRIG, CHOLHDL, LDLDIRECT in the last 72 hours.  Lab Results  Component Value Date   HGBA1C 5.6 02/01/2019   ------------------------------------------------------------------------------------------------------------------ No results for input(s): TSH, T4TOTAL, T3FREE, THYROIDAB in the last 72 hours.  Invalid input(s): FREET3 ------------------------------------------------------------------------------------------------------------------ No results for input(s): VITAMINB12, FOLATE, FERRITIN, TIBC, IRON, RETICCTPCT in the last 72 hours.  Coagulation profile Recent Labs  Lab 06/22/19 0247 06/22/19 1148  INR 1.1 1.1    No results for input(s): DDIMER in the last 72 hours.  Cardiac Enzymes No results for input(s): CKMB, TROPONINI, MYOGLOBIN in the last 168 hours.  Invalid input(s): CK ------------------------------------------------------------------------------------------------------------------ No results found for: BNP  Inpatient Medications  Scheduled Meds:  allopurinol  100 mg  Oral BID   aspirin EC  81 mg Oral Daily   docusate sodium  100 mg Oral BID   [START ON 06/23/2019] enoxaparin (LOVENOX) injection  40 mg Subcutaneous Q24H   gabapentin  300 mg Oral BID   [START ON 06/23/2019] losartan  100 mg Oral Daily   metoprolol tartrate  50 mg Oral BID   PARoxetine  30 mg Oral q morning - 10a   [START ON 06/23/2019]  raloxifene  60 mg Oral Daily   senna  1 tablet Oral BID   Continuous Infusions:   ceFAZolin (ANCEF) IV     [START ON 06/23/2019] cefTRIAXone (ROCEPHIN)  IV     PRN Meds:.HYDROcodone-acetaminophen, HYDROmorphone (DILAUDID) injection, meclizine, menthol-cetylpyridinium **OR** phenol, metoCLOPramide **OR** metoCLOPramide (REGLAN) injection, ondansetron **OR** ondansetron (ZOFRAN) IV  Micro Results Recent Results (from the past 240 hour(s))  SARS Coronavirus 2 [facility] order, Performed in [facility] hospital lab) Nasopharyngeal Nasopharyngeal Swab     Status: None   Collection Time: 06/21/19  6:05 PM   Specimen: Nasopharyngeal Swab  Result Value Ref Range Status   SARS Coronavirus 2 NEGATIVE NEGATIVE Final    Comment: (NOTE) If result is NEGATIVE SARS-CoV-2 target nucleic acids are NOT DETECTED. The SARS-CoV-2 RNA is generally detectable in upper and lower  respiratory specimens during the acute phase of infection. The lowest  concentration of SARS-CoV-2 viral copies this assay can detect is 250  copies / mL. A negative result does not preclude SARS-CoV-2 infection  and should not be used as the sole basis for treatment or other  patient management decisions.  A negative result may occur with  improper specimen collection / handling, submission of specimen other  than nasopharyngeal swab, presence of viral mutation(s) within the  areas targeted by this assay, and inadequate number of viral copies  (<250 copies / mL). A negative result must be combined with clinical  observations, patient history, and epidemiological information. If result is POSITIVE SARS-CoV-2 target nucleic acids are DETECTED. The SARS-CoV-2 RNA is generally detectable in upper and lower  respiratory specimens dur ing the acute phase of infection.  Positive  results are indicative of active infection with SARS-CoV-2.  Clinical  correlation with patient history and other diagnostic information is  necessary to  determine patient infection status.  Positive results do  not rule out bacterial infection or co-infection with other viruses. If result is PRESUMPTIVE POSTIVE SARS-CoV-2 nucleic acids MAY BE PRESENT.   A presumptive positive result was obtained on the submitted specimen  and confirmed on repeat testing.  While 2019 novel coronavirus  (SARS-CoV-2) nucleic acids may be present in the submitted sample  additional confirmatory testing may be necessary for epidemiological  and / or clinical management purposes  to differentiate between  SARS-CoV-2 and other Sarbecovirus currently known to infect humans.  If clinically indicated additional testing with an alternate test  methodology [phone]) is advised. The SARS-CoV-2 RNA is generally  detectable in upper and lower respiratory sp ecimens during the acute  phase of infection. The expected result is Negative. Fact Sheet for Patients:  StrictlyIdeas.no Fact Sheet for Healthcare Providers: BankingDealers.co.za This test is not yet approved or cleared by the [place] FDA and has been authorized for detection and/or diagnosis of SARS-CoV-2 by FDA under an Emergency Use Authorization (EUA).  This EUA will remain in effect (meaning this test can be used) for the duration of the COVID-19 declaration under Section 564(b)(1) of the Act, 21 U.S.C. section 360bbb-3(b)(1), unless the authorization is  terminated or revoked sooner. Performed at [facility], [address] [place], [place] [address]     Radiology Reports Pelvis Portable  Result Date: 06/22/2019 CLINICAL DATA:  Postop left femur EXAM: PORTABLE PELVIS 1-2 VIEWS COMPARISON:  06/21/2019 FINDINGS: Internal fixation across the left femoral intertrochanteric fracture. Lesser trochanter remains displaced, otherwise anatomic alignment. No visible hardware complicating feature. IMPRESSION: Internal fixation across the left  intertrochanteric fracture as above. Electronically Signed   By: Rolm Baptise M.D.   On: 06/22/2019 11:32   Chest Portable 1 View  Result Date: 06/22/2019 CLINICAL DATA:  Preop evaluation for upcoming hip surgery EXAM: PORTABLE CHEST 1 VIEW COMPARISON:  03/19/2019 FINDINGS: Cardiac shadow is at the upper limits of normal in size. Aortic calcifications are noted. The lungs are well aerated bilaterally. No acute bony abnormality is seen. Healing rib fractures are noted bilaterally. IMPRESSION: No acute abnormality noted. Electronically Signed   By: Inez Catalina M.D.   On: 06/22/2019 08:01   Dg Hip Unilat With Pelvis 2-3 Views Left  Result Date: 06/21/2019 CLINICAL DATA:  85 year old who fell earlier this evening and was found by her family on the floor at home. LEFT hip injury with pain. Initial encounter. EXAM: DG HIP (WITH OR WITHOUT PELVIS) 2-3V LEFT COMPARISON:  04/02/2018. FINDINGS: Comminuted intertrochanteric LEFT femoral neck fracture into multiple parts. Hip joint anatomically aligned with severe SUPERIOR joint space narrowing. Included AP pelvis demonstrates no fractures elsewhere. Sacroiliac joints and symphysis pubis anatomically aligned. Severe joint space narrowing involving the contralateral RIGHT hip. IMPRESSION: 1. Acute traumatic comminuted intertrochanteric LEFT femoral neck fracture. 2. Anatomic alignment the LEFT hip joint with severe SUPERIOR joint space narrowing. 3. Severe osteoarthritis involving the contralateral RIGHT hip. Electronically Signed   By: Evangeline Dakin M.D.   On: 06/21/2019 18:46    Time Spent in minutes  25   Odaly Peri M.D on 06/22/2019 at 12:23 PM  Between 7am to 7pm - Pager - [phone]  After 7pm go to [email or web address] - password [facility]  [facility] -  Office  [phone]

## 2019-06-22 NOTE — Progress Notes (Signed)
Dr. Fransisco Beau notified that pt temp is 99.8, also has not had metoprolol since she has been in the hospital. No new orders.

## 2019-06-22 NOTE — Anesthesia Preprocedure Evaluation (Addendum)
Anesthesia Evaluation  Patient identified by MRN, date of birth, ID band Patient awake    Reviewed: Allergy & Precautions, NPO status , Patient's Chart, lab work & pertinent test results, reviewed documented beta blocker date and time   History of Anesthesia Complications Negative for: history of anesthetic complications  Airway Mallampati: II  TM Distance: >3 FB Neck ROM: Full    Dental  (+) Dental Advisory Given   Pulmonary neg pulmonary ROS,    Pulmonary exam normal        Cardiovascular hypertension, Pt. on medications and Pt. on home beta blockers Normal cardiovascular exam     Neuro/Psych PSYCHIATRIC DISORDERS Anxiety Depression Dementia  Vertigo     GI/Hepatic Neg liver ROS, GERD  Controlled, Colon cancer s/p resection    Endo/Other  negative endocrine ROS  Renal/GU negative Renal ROS     Musculoskeletal  (+) Arthritis ,  Spinal compression fracture    Abdominal   Peds  Hematology  Thrombocytopenia    Anesthesia Other Findings   Reproductive/Obstetrics                            Anesthesia Physical Anesthesia Plan  ASA: III  Anesthesia Plan: General   Post-op Pain Management:    Induction: Intravenous  PONV Risk Score and Plan: 3 and Treatment may vary due to age or medical condition, Ondansetron and TIVA  Airway Management Planned: Oral ETT  Additional Equipment: None  Intra-op Plan:   Post-operative Plan: Extubation in OR  Informed Consent: I have reviewed the patients History and Physical, chart, labs and discussed the procedure including the risks, benefits and alternatives for the proposed anesthesia with the patient or authorized representative who has indicated his/her understanding and acceptance.     Dental advisory given and Consent reviewed with POA  Plan Discussed with: CRNA and Anesthesiologist  Anesthesia Plan Comments: (Lengthy discussion  with daughter at bedside (and patient) regarding both short and long-term effects of surgery and anesthesia on cognition in elderly patients, especially those with baseline dementia. All questions answered, patient and daughter agreeable to proceed.)       Anesthesia Quick Evaluation

## 2019-06-22 NOTE — Telephone Encounter (Signed)
Thanks so much-I will watch out for records

## 2019-06-22 NOTE — Op Note (Signed)
OPERATIVE REPORT  SURGEON: Rod Can, MD   ASSISTANT: Nehemiah Massed, PA-C.  PREOPERATIVE DIAGNOSIS: Left intertrochanteric femur fracture.   POSTOPERATIVE DIAGNOSIS: Left intertrochanteric femur fracture.   PROCEDURE: Intramedullary fixation, Left femur.   IMPLANTS: Biomet Affixus Hip Fracture Nail, 11 by 180 mm, 125 degrees. 10.5 x 90 mm Hip Fracture Nail Lag Screw. 5 x 32 mm distal interlocking screw 1.  ANESTHESIA:  General  ESTIMATED BLOOD LOSS:-80 mL    ANTIBIOTICS: 2 g Ancef.  DRAINS: None.  COMPLICATIONS: None.   CONDITION: PACU - hemodynamically stable.  BRIEF CLINICAL NOTE: Michelle Gregory is a 85 y.o. female who presented with an intertrochanteric femur fracture. The patient was admitted to the hospitalist service and underwent perioperative risk stratification and medical optimization. The risks, benefits, and alternatives to the procedure were explained, and the patient elected to proceed.  PROCEDURE IN DETAIL: Surgical site was marked by myself. The patient was taken to the operating room and anesthesia was induced on the bed. The patient was then transferred to the [facility] table and the nonoperative lower extremity was scissored underneath the operative side. The fracture was reduced with traction, internal rotation, and adduction. The hip was prepped and draped in the normal sterile surgical fashion. Timeout was called verifying side and site of surgery. Preop antibiotics were given with 60 minutes of beginning the procedure.  Fluoroscopy was used to define the patient's anatomy. A 4 cm incision was made just proximal to the tip of the greater trochanter. The awl was used to obtain the standard starting point for a trochanteric entry nail under fluoroscopic control. The guidepin was placed. The entry reamer was used to open the proximal femur.  In order to improve the reduction, I made a stab incision over the anterior trochanter and placed a bone hook onto the femoral  neck subperiosteally.   On the back table, the nail was assembled onto the jig. The nail was placed into the femur without any difficulty. Through a separate stab incision, the cannula was placed down to the bone in preparation for the cephalomedullary device. A guidepin was placed into the femoral head using AP and lateral fluoroscopy views. The pin was measured, and then reaming was performed to the appropriate depth. The lag screw was inserted to the appropriate depth. The fracture was compressed through the jig. The setscrew was tightened and then loosened one quarter turn. A separate stab incision was created, and the distal interlocking screw was placed using standard AO technique. The jig was removed. Final AP and lateral fluoroscopy views were obtained to confirm fracture reduction and hardware placement. Tip apex distance was appropriate. There was no chondral penetration.  The wounds were copiously irrigated with saline. The wound was closed in layers with #1 Vicryl for the fascia, 2-0 Monocryl for the deep dermal layer, and 3-0 Monocryl subcuticular stitch. Glue was applied to the skin. Once the glue was fully hardened, sterile dressing was applied. The patient was then awakened from anesthesia and taken to the PACU in stable condition. Sponge needle and instrument counts were correct at the end of the case 2. There were no known complications.  We will readmit the patient to the hospitalist. Weightbearing status will be weightbearing as tolerated with a walker. We will begin Lovenox for DVT prophylaxis. The patient will work with physical therapy and undergo disposition planning.  Please note that a surgical assistant was a medical necessity for this procedure to perform it in a safe and expeditious manner. Environmental consultant  was necessary to maintain the reduction, to allow for anatomic placement of the prosthesis, and for wound closure.

## 2019-06-23 ENCOUNTER — Encounter (HOSPITAL_COMMUNITY): Payer: Self-pay | Admitting: Orthopedic Surgery

## 2019-06-23 LAB — CBC
HCT: 24.2 % — ABNORMAL LOW (ref 36.0–46.0)
Hemoglobin: 8 g/dL — ABNORMAL LOW (ref 12.0–15.0)
MCH: 33.1 pg (ref 26.0–34.0)
MCHC: 33.1 g/dL (ref 30.0–36.0)
MCV: 100 fL (ref 80.0–100.0)
Platelets: 110 10*3/uL — ABNORMAL LOW (ref 150–400)
RBC: 2.42 MIL/uL — ABNORMAL LOW (ref 3.87–5.11)
RDW: 13.1 % (ref 11.5–15.5)
WBC: 16 10*3/uL — ABNORMAL HIGH (ref 4.0–10.5)
nRBC: 0 % (ref 0.0–0.2)

## 2019-06-23 LAB — BASIC METABOLIC PANEL
Anion gap: 16 — ABNORMAL HIGH (ref 5–15)
BUN: 32 mg/dL — ABNORMAL HIGH (ref 8–23)
CO2: 21 mmol/L — ABNORMAL LOW (ref 22–32)
Calcium: 8.4 mg/dL — ABNORMAL LOW (ref 8.9–10.3)
Chloride: 103 mmol/L (ref 98–111)
Creatinine, Ser: 1.1 mg/dL — ABNORMAL HIGH (ref 0.44–1.00)
GFR calc Af Amer: 50 mL/min — ABNORMAL LOW (ref >60)
GFR calc non Af Amer: 43 mL/min — ABNORMAL LOW (ref >60)
Glucose, Bld: 111 mg/dL — ABNORMAL HIGH (ref 70–99)
Potassium: 3.4 mmol/L — ABNORMAL LOW (ref 3.5–5.1)
Sodium: 140 mmol/L (ref 135–145)

## 2019-06-23 MED ORDER — HYDROCODONE-ACETAMINOPHEN 5-325 MG PO TABS: 1.0000 | ORAL_TABLET | ORAL | 0 refills | Status: DC | PRN

## 2019-06-23 MED ORDER — SODIUM CHLORIDE 0.9 % IV BOLUS
1000.0000 mL | Freq: Once | INTRAVENOUS | Status: AC
  Administered 2019-06-23: 1000 mL via INTRAVENOUS

## 2019-06-23 MED ORDER — LACTATED RINGERS IV SOLN
INTRAVENOUS | Status: DC
  Administered 2019-06-23 – 2019-06-24 (×3): via INTRAVENOUS

## 2019-06-23 MED ORDER — SODIUM CHLORIDE 0.9 % IV SOLN: 1.0000 g | INTRAVENOUS | Status: DC

## 2019-06-23 MED ORDER — POTASSIUM CHLORIDE CRYS ER 20 MEQ PO TBCR
40.0000 meq | EXTENDED_RELEASE_TABLET | Freq: Once | ORAL | Status: AC
  Administered 2019-06-23: 40 meq via ORAL
  Filled 2019-06-23: qty 2

## 2019-06-23 MED ORDER — ENOXAPARIN SODIUM 30 MG/0.3ML ~~LOC~~ SOLN: 30.0000 mg | SUBCUTANEOUS | 0 refills | Status: DC

## 2019-06-23 NOTE — Progress Notes (Addendum)
PROGRESS NOTE                                                                                                                                                                                                             Patient Demographics:    Michelle Gregory, is a 85 y.o. female, DOB - 11/15/1922, [id number]  Admit date - 06/21/2019   Admitting Physician Reubin Milan, MD  Outpatient Primary MD for the patient is Tower, [name], MD  LOS - 2   Chief Complaint  Patient presents with   Fall       Brief Narrative   85 year old female with a history of anxiety, depression, C. difficile, GERD, colon cancer, recurrent UTIs, hypertension, osteoarthritis brought to the ED after sustaining a fall at home with the x-ray showing left intertrochanteric femur fracture.  Patient seen by orthopedics and taken to the OR for intramedullary fixation of the left femur.   Subjective:   Seen and examined.  Was confused this morning.  No overnight events.   Assessment  & Plan :    Principal Problem:   Closed left hip fracture, initial encounter [facility]) Status post left femur intramedullary fixation.  Tolerated surgery well  Pain control with PRN Norco.  Added bowel regimen.  DVT prophylaxis with subcu Lovenox. Discontinue Foley.  Start therapy with PT.   Active Problems: Acute blood loss anemia/thrombocytopenia Hemoglobin dropped to 8 this morning.  Baseline around 12.  Monitor hemoglobin and platelet closely.  Leukocytosis WC of 16 K.  Possibly reactive and associated UTI.  Monitor closely.  Urinary tract infection Urine culture growing GNR.  Follow sensitivity.  On empiric Rocephin.  Hypokalemia/hypomagnesemia Replenished   Essential hypertension Blood pressure stable.  Continue home meds.  GERD Continue PPI  Gout Continue allopurinol.  Stable      Code Status : Full  Family Communication  : None at  bedside  Disposition Plan  : Possibly needs SNF (should be ready for discharge in 1-2 days)..  PT evaluation pending.  Barriers For Discharge : Active symptoms  Consults  : Orthopedics (Dr. Lyla Glassing)  Procedures  : IM nailing of left femur fracture  DVT Prophylaxis  :  Lovenox -   Lab Results  Component Value Date   PLT 110 (L) 06/23/2019    Antibiotics  :  Anti-infectives (From admission, onward)   Start     Dose/Rate Route Frequency Ordered Stop   06/23/19 0100  cefTRIAXone (ROCEPHIN) 1 g in sodium chloride 0.9 % 100 mL IVPB     1 g 200 mL/hr over 30 Minutes Intravenous Every 24 hours 06/22/19 0727     06/22/19 1430  ceFAZolin (ANCEF) IVPB 2g/100 mL premix     2 g 200 mL/hr over 30 Minutes Intravenous Every 6 hours 06/22/19 1130 06/22/19 2209   06/22/19 0715  ceFAZolin (ANCEF) IVPB 2g/100 mL premix     2 g 200 mL/hr over 30 Minutes Intravenous On call to O.R. 06/22/19 0710 06/22/19 0834   06/22/19 0030  cefTRIAXone (ROCEPHIN) 1 g in sodium chloride 0.9 % 100 mL IVPB     1 g 200 mL/hr over 30 Minutes Intravenous  Once 06/22/19 0017 06/22/19 0131        Objective:   Vitals:   06/22/19 1330 06/22/19 1446 06/22/19 2304 06/23/19 0606  BP: 105/64 95/62 108/63 (!) 118/59  Pulse: 70 93 (!) 108 (!) 106  Resp: 16 18 16 16   Temp: 97.6 F (36.4 C) (!) 97.3 F (36.3 C) 98.4 F (36.9 C) 99 F (37.2 C)  TempSrc: Oral Oral Oral Oral  SpO2: 94% 100% 100% 98%  Weight:      Height:        Wt Readings from Last 3 Encounters:  06/22/19 54.4 kg  01/23/19 51 kg  09/01/18 53 kg     Intake/Output Summary (Last 24 hours) at 06/23/2019 1043 Last data filed at 06/23/2019 0609 Gross per 24 hour  Intake 420 ml  Output 550 ml  Net -130 ml    Physical exam Elderly female not in distress HEENT: Moist mucosa, supple neck Chest: Clear bilaterally CVs: Normal S1-S2 GI: Soft, nondistended, nontender, bowel sounds present, Foley draining clear urine Musculoskeletal: Warm, no  edema, clean dressing over the left hip  CNS: Alert and oriented    Data Review:    CBC Recent Labs  Lab 06/21/19 1911 06/22/19 1148 06/23/19 0429  WBC 13.7* 14.2* 16.0*  HGB 12.1 9.7* 8.0*  HCT 36.4 30.2* 24.2*  PLT 142* PLATELET CLUMPS NOTED ON SMEAR, UNABLE TO ESTIMATE 110*  MCV 99.7 103.1* 100.0  MCH 33.2 33.1 33.1  MCHC 33.2 32.1 33.1  RDW 12.7 13.0 13.1  LYMPHSABS 0.9  --   --   MONOABS 0.9  --   --   EOSABS 0.0  --   --   BASOSABS 0.0  --   --     Chemistries  Recent Labs  Lab 06/21/19 1911 06/22/19 1149 06/23/19 0429  NA 138  --  140  K 3.9  --  3.4*  CL 104  --  103  CO2 22  --  21*  GLUCOSE 145*  --  111*  BUN 22  --  32*  CREATININE 0.74 1.03* 1.10*  CALCIUM 8.6*  --  8.4*  MG 1.5*  --   --   AST 24  --   --   ALT 14  --   --   ALKPHOS 44  --   --   BILITOT 1.0  --   --    ------------------------------------------------------------------------------------------------------------------ No results for input(s): CHOL, HDL, LDLCALC, TRIG, CHOLHDL, LDLDIRECT in the last 72 hours.  Lab Results  Component Value Date   HGBA1C 5.6 02/01/2019   ------------------------------------------------------------------------------------------------------------------ No results for input(s): TSH, T4TOTAL, T3FREE, THYROIDAB in the last 72 hours.  Invalid  input(s): FREET3 ------------------------------------------------------------------------------------------------------------------ No results for input(s): VITAMINB12, FOLATE, FERRITIN, TIBC, IRON, RETICCTPCT in the last 72 hours.  Coagulation profile Recent Labs  Lab 06/22/19 0247 06/22/19 1148  INR 1.1 1.1    No results for input(s): DDIMER in the last 72 hours.  Cardiac Enzymes No results for input(s): CKMB, TROPONINI, MYOGLOBIN in the last 168 hours.  Invalid input(s): CK ------------------------------------------------------------------------------------------------------------------ No  results found for: BNP  Inpatient Medications  Scheduled Meds:  allopurinol  100 mg Oral BID   aspirin EC  81 mg Oral Daily   docusate sodium  100 mg Oral BID   enoxaparin (LOVENOX) injection  30 mg Subcutaneous Q24H   gabapentin  300 mg Oral BID   losartan  100 mg Oral Daily   metoprolol tartrate  50 mg Oral BID   PARoxetine  30 mg Oral q morning - 10a   raloxifene  60 mg Oral Daily   senna  1 tablet Oral BID   Continuous Infusions:  cefTRIAXone (ROCEPHIN)  IV 1 g (06/23/19 0031)   PRN Meds:.HYDROcodone-acetaminophen, HYDROmorphone (DILAUDID) injection, meclizine, menthol-cetylpyridinium **OR** phenol, metoCLOPramide **OR** metoCLOPramide (REGLAN) injection, ondansetron **OR** ondansetron (ZOFRAN) IV  Micro Results Recent Results (from the past 240 hour(s))  Urine culture     Status: Abnormal (Preliminary result)   Collection Time: 06/21/19  6:04 PM   Specimen: Urine, Clean Catch  Result Value Ref Range Status   Specimen Description   Final    URINE, CLEAN CATCH Performed at [facility], [place] [address], [place], [place] [address]    Special Requests   Final    NONE Performed at [facility], [place] [address] [place], [place] [address]    Culture (A)  Final    >=100,000 COLONIES/mL GRAM NEGATIVE RODS IDENTIFICATION AND SUSCEPTIBILITIES TO FOLLOW REPEATING TO CONFIRM Performed at [facility], [place] [address], [place], [place] [address]    Report Status PENDING  Incomplete  SARS Coronavirus 2 [facility] order, Performed in [facility] hospital lab) Nasopharyngeal Nasopharyngeal Swab     Status: None   Collection Time: 06/21/19  6:05 PM   Specimen: Nasopharyngeal Swab  Result Value Ref Range Status   SARS Coronavirus 2 NEGATIVE NEGATIVE Final    Comment: (NOTE) If result is NEGATIVE SARS-CoV-2 target nucleic acids are NOT DETECTED. The SARS-CoV-2 RNA is generally detectable in upper and lower  respiratory specimens  during the acute phase of infection. The lowest  concentration of SARS-CoV-2 viral copies this assay can detect is 250  copies / mL. A negative result does not preclude SARS-CoV-2 infection  and should not be used as the sole basis for treatment or other  patient management decisions.  A negative result may occur with  improper specimen collection / handling, submission of specimen other  than nasopharyngeal swab, presence of viral mutation(s) within the  areas targeted by this assay, and inadequate number of viral copies  (<250 copies / mL). A negative result must be combined with clinical  observations, patient history, and epidemiological information. If result is POSITIVE SARS-CoV-2 target nucleic acids are DETECTED. The SARS-CoV-2 RNA is generally detectable in upper and lower  respiratory specimens dur ing the acute phase of infection.  Positive  results are indicative of active infection with SARS-CoV-2.  Clinical  correlation with patient history and other diagnostic information is  necessary to determine patient infection status.  Positive results do  not rule out bacterial infection or co-infection with other viruses. If result is  PRESUMPTIVE POSTIVE SARS-CoV-2 nucleic acids MAY BE PRESENT.   A presumptive positive result was obtained on the submitted specimen  and confirmed on repeat testing.  While 2019 novel coronavirus  (SARS-CoV-2) nucleic acids may be present in the submitted sample  additional confirmatory testing may be necessary for epidemiological  and / or clinical management purposes  to differentiate between  SARS-CoV-2 and other Sarbecovirus currently known to infect humans.  If clinically indicated additional testing with an alternate test  methodology [phone]) is advised. The SARS-CoV-2 RNA is generally  detectable in upper and lower respiratory sp ecimens during the acute  phase of infection. The expected result is Negative. Fact Sheet for Patients:   StrictlyIdeas.no Fact Sheet for Healthcare Providers: BankingDealers.co.za This test is not yet approved or cleared by the [place] FDA and has been authorized for detection and/or diagnosis of SARS-CoV-2 by FDA under an Emergency Use Authorization (EUA).  This EUA will remain in effect (meaning this test can be used) for the duration of the COVID-19 declaration under Section 564(b)(1) of the Act, 21 U.S.C. section 360bbb-3(b)(1), unless the authorization is terminated or revoked sooner. Performed at [facility], [place] [address] [place], [place] [address]     Radiology Reports Dg Tibia/fibula Right  Result Date: 06/23/2019 CLINICAL DATA:  Right lower leg injury and pain after a fall 06/21/2019. EXAM: RIGHT TIBIA AND FIBULA - 2 VIEW COMPARISON:  None. FINDINGS: No acute bony or joint abnormality is identified. The patient has advanced tricompartmental osteoarthritis of the right knee. Soft tissues are unremarkable. IMPRESSION: No acute abnormality. Advanced osteoarthritis right knee. Electronically Signed   By: Inge Rise M.D.   On: 06/23/2019 09:55   Ct Head Wo Contrast  Result Date: 06/22/2019 CLINICAL DATA:  Fall.  Headache. EXAM: CT HEAD WITHOUT CONTRAST CT CERVICAL SPINE WITHOUT CONTRAST TECHNIQUE: Multidetector CT imaging of the head and cervical spine was performed following the standard protocol without intravenous contrast. Multiplanar CT image reconstructions of the cervical spine were also generated. COMPARISON:  CT head 03/19/2019 FINDINGS: CT HEAD FINDINGS Brain: Generalized atrophy.  Mild white matter changes. Negative for acute infarct, hemorrhage, mass.  No midline shift. Vascular: Negative for hyperdense vessel. Atherosclerotic calcification. Skull: Negative for skull fracture Sinuses/Orbits: Mild mucosal edema paranasal sinuses. Bilateral cataract surgery. Other: None CT CERVICAL SPINE FINDINGS  Alignment: Mild anterolisthesis C4-5. Straightening of the cervical lordosis. Skull base and vertebrae: Negative for fracture Soft tissues and spinal canal: Atherosclerotic carotid bifurcation bilaterally. No soft tissue mass Disc levels: Multilevel cervical spondylosis with disc degeneration and facet degeneration. Disc space narrowing and spurring most prominent C4-5 and C5-6. Moderate spinal stenosis at C5-6. Upper chest: Lung apices clear bilaterally. Apically scarring bilaterally. Other: None IMPRESSION: 1. Atrophy and chronic microvascular ischemia. No acute intracranial abnormality. 2. Cervical spondylosis without acute fracture. Electronically Signed   By: Franchot Gallo M.D.   On: 06/22/2019 15:48   Ct Cervical Spine Wo Contrast  Result Date: 06/22/2019 CLINICAL DATA:  Fall.  Headache. EXAM: CT HEAD WITHOUT CONTRAST CT CERVICAL SPINE WITHOUT CONTRAST TECHNIQUE: Multidetector CT imaging of the head and cervical spine was performed following the standard protocol without intravenous contrast. Multiplanar CT image reconstructions of the cervical spine were also generated. COMPARISON:  CT head 03/19/2019 FINDINGS: CT HEAD FINDINGS Brain: Generalized atrophy.  Mild white matter changes. Negative for acute infarct, hemorrhage, mass.  No midline shift. Vascular: Negative for hyperdense vessel. Atherosclerotic calcification. Skull: Negative for skull fracture Sinuses/Orbits: Mild mucosal edema paranasal sinuses.  Bilateral cataract surgery. Other: None CT CERVICAL SPINE FINDINGS Alignment: Mild anterolisthesis C4-5. Straightening of the cervical lordosis. Skull base and vertebrae: Negative for fracture Soft tissues and spinal canal: Atherosclerotic carotid bifurcation bilaterally. No soft tissue mass Disc levels: Multilevel cervical spondylosis with disc degeneration and facet degeneration. Disc space narrowing and spurring most prominent C4-5 and C5-6. Moderate spinal stenosis at C5-6. Upper chest: Lung apices  clear bilaterally. Apically scarring bilaterally. Other: None IMPRESSION: 1. Atrophy and chronic microvascular ischemia. No acute intracranial abnormality. 2. Cervical spondylosis without acute fracture. Electronically Signed   By: Franchot Gallo M.D.   On: 06/22/2019 15:48   Ct Lumbar Spine Wo Contrast  Result Date: 06/22/2019 CLINICAL DATA:  Fall.  Back pain.  History of fracture. EXAM: CT LUMBAR SPINE WITHOUT CONTRAST TECHNIQUE: Multidetector CT imaging of the lumbar spine was performed without intravenous contrast administration. Multiplanar CT image reconstructions were also generated. COMPARISON:  CT lumbar 04/02/2018 FINDINGS: Segmentation: Normal Alignment: Approximately 10 mm retrolisthesis L1 relative to L3. Burst fracture L2 unchanged. Mild anterolisthesis L3-4 L4-5 Vertebrae: Moderate compression fracture T12 is chronic and unchanged Fracture of the inferior endplate anteriorly of L1 is chronic and unchanged Severe burst fracture of L2 is unchanged with cement in the anterior vertebral body. There is extensive retropulsion of bone into the canal unchanged. No acute fracture.  Chronic fractures of the sacrum bilaterally. Paraspinal and other soft tissues: Negative for paraspinous mass or edema. Disc levels: T11-12: Mild to moderate spinal stenosis due to T12 fracture and spurring T12-L1: Disc and facet degeneration without stenosis L1-2: Severe spinal stenosis due to retropulsion of L2 into the canal. Spinal stenosis unchanged from the prior study. Bilateral facet degeneration contributes to stenosis L2-3: Mild disc and facet degeneration without significant spinal stenosis L3-4: Severe facet degeneration and diffuse disc bulging. Moderate spinal stenosis unchanged L4-5: Severe facet degeneration. Diffuse disc bulging. Moderate spinal stenosis and moderate to severe subarticular stenosis bilaterally without interval change. L5-S1: Severe facet degeneration and mild disc degeneration. Decompressive  laminectomy without significant spinal stenosis. IMPRESSION: Negative for acute lumbar fracture Chronic compression fractures T12, L1, L2. Chronic burst fracture L2 with retropulsion of bone in the canal and severe spinal stenosis unchanged from 1 year ago. Multilevel degenerative change and spinal stenosis throughout the lumbar spine as above. Electronically Signed   By: Franchot Gallo M.D.   On: 06/22/2019 15:55   Pelvis Portable  Result Date: 06/22/2019 CLINICAL DATA:  Postop left femur EXAM: PORTABLE PELVIS 1-2 VIEWS COMPARISON:  06/21/2019 FINDINGS: Internal fixation across the left femoral intertrochanteric fracture. Lesser trochanter remains displaced, otherwise anatomic alignment. No visible hardware complicating feature. IMPRESSION: Internal fixation across the left intertrochanteric fracture as above. Electronically Signed   By: Rolm Baptise M.D.   On: 06/22/2019 11:32   Chest Portable 1 View  Result Date: 06/22/2019 CLINICAL DATA:  Preop evaluation for upcoming hip surgery EXAM: PORTABLE CHEST 1 VIEW COMPARISON:  03/19/2019 FINDINGS: Cardiac shadow is at the upper limits of normal in size. Aortic calcifications are noted. The lungs are well aerated bilaterally. No acute bony abnormality is seen. Healing rib fractures are noted bilaterally. IMPRESSION: No acute abnormality noted. Electronically Signed   By: Inez Catalina M.D.   On: 06/22/2019 08:01   Dg Knee Complete 4 Views Left  Result Date: 06/22/2019 CLINICAL DATA:  Left hip fracture EXAM: LEFT KNEE - COMPLETE 4+ VIEW COMPARISON:  None. FINDINGS: Moderate left knee joint effusion. No acute fracture or traumatic malalignment. Mild tricompartmental degenerative changes with  bilateral meniscal chondrocalcinosis. Additional mineralization along the lateral joint line demonstrates a in arcs and whorled chondroid matrix which may reflect an intra-articular body which could be sequela of synovial chondromatosis or prior injury versus other  heterotopic ossification. Extensive atherosclerotic calcification. Remaining soft tissues are unremarkable. IMPRESSION: 1. Mild tricompartmental degenerative changes. No acute osseous abnormality. 2. Moderate left knee joint effusion. 3. Nonspecific mineralization along the lateral joint line, synovial chondromatosis versus posttraumatic loose body versus heterotopic calcification Electronically Signed   By: Lovena Le M.D.   On: 06/22/2019 19:59   Dg C-arm 1-60 Min-no Report  Result Date: 06/22/2019 Fluoroscopy was utilized by the requesting physician.  No radiographic interpretation.   Dg Hip Operative Unilat W Or W/o Pelvis Left  Result Date: 06/22/2019 CLINICAL DATA:  Femur fracture EXAM: OPERATIVE left HIP (WITH PELVIS IF PERFORMED) 2 VIEWS TECHNIQUE: Fluoroscopic spot image(s) were submitted for interpretation post-operatively. COMPARISON:  06/21/2019 FINDINGS: Two low resolution intraoperative spot views of the left hip. Intramedullary rod and screw fixation of comminuted left intertrochanteric fracture with slightly displaced lesser trochanteric fracture fragment. Total fluoroscopy time was 48 seconds IMPRESSION: Intraoperative fluoroscopic assistance provided during surgical fixation of left femur fracture Electronically Signed   By: Donavan Foil M.D.   On: 06/22/2019 15:00   Dg Hip Unilat With Pelvis 2-3 Views Left  Result Date: 06/21/2019 CLINICAL DATA:  85 year old who fell earlier this evening and was found by her family on the floor at home. LEFT hip injury with pain. Initial encounter. EXAM: DG HIP (WITH OR WITHOUT PELVIS) 2-3V LEFT COMPARISON:  04/02/2018. FINDINGS: Comminuted intertrochanteric LEFT femoral neck fracture into multiple parts. Hip joint anatomically aligned with severe SUPERIOR joint space narrowing. Included AP pelvis demonstrates no fractures elsewhere. Sacroiliac joints and symphysis pubis anatomically aligned. Severe joint space narrowing involving the contralateral  RIGHT hip. IMPRESSION: 1. Acute traumatic comminuted intertrochanteric LEFT femoral neck fracture. 2. Anatomic alignment the LEFT hip joint with severe SUPERIOR joint space narrowing. 3. Severe osteoarthritis involving the contralateral RIGHT hip. Electronically Signed   By: Evangeline Dakin M.D.   On: 06/21/2019 18:46   Dg Femur Min 2 Views Left  Result Date: 06/23/2019 CLINICAL DATA:  Fall.  Left hip fracture EXAM: Left femur-two view: COMPARISON:  Hip series performed earlier today FINDINGS: The previously described left femoral intertrochanteric fracture with comminution and varus angulation again noted. No additional bony abnormality within the left femur. The left kidney is not completely visualized. IMPRESSION: Comminuted left proximal femoral intertrochanteric fracture with varus angulation. Electronically Signed   By: Rolm Baptise M.D.   On: 06/22/2019 16:12    Time Spent in minutes  25   Nastasha Reising M.D on 06/23/2019 at 10:43 AM  Between 7am to 7pm - Pager - [phone]  After 7pm go to [email or web address] - password [facility]  [facility] -  Office  [phone]

## 2019-06-23 NOTE — Progress Notes (Addendum)
   Vital Signs MEWS/VS Documentation      06/23/2019 0606 06/23/2019 0900 06/23/2019 1409 06/23/2019 1435   MEWS Score:  1  1  3  4    MEWS Score Color:  Green  Green  Yellow  Red   Resp:  16  -  (!) 23  -   Pulse:  (!) 106  -  91  -   BP:  (!) 118/59  -  (!) 71/45  (!) 62/39   Temp:  99 F (37.2 C)  -  97.9 F (36.6 C)  -   O2 Device:  Nasal Cannula  -  Room Air  -   O2 Flow Rate (L/min):  2 L/min  -  -  -   Level of Consciousness:  -  Alert  -  -     MEWS score Red. MD made aware. Orders received for 1L NS bolus to treat hypotension and decreased urine output. Will monitor VS per MEWS protocol.       Arlean Thies, Janett Billow L 06/23/2019,2:47 PM

## 2019-06-23 NOTE — Progress Notes (Signed)
    Subjective:  Patient reports pain as mild to moderate.  Denies N/V/CP/SOB. No c/o.  Objective:   VITALS:   Vitals:   06/22/19 1330 06/22/19 1446 06/22/19 2304 06/23/19 0606  BP: 105/64 95/62 108/63 (!) 118/59  Pulse: 70 93 (!) 108 (!) 106  Resp: 16 18 16 16   Temp: 97.6 F (36.4 C) (!) 97.3 F (36.3 C) 98.4 F (36.9 C) 99 F (37.2 C)  TempSrc: Oral Oral Oral Oral  SpO2: 94% 100% 100% 98%  Weight:      Height:        NAD, pleasantly confused Sensation intact distally Intact pulses distally Dorsiflexion/Plantar flexion intact Incision: dressing C/D/I Compartment soft   Lab Results  Component Value Date   WBC 16.0 (H) 06/23/2019   HGB 8.0 (L) 06/23/2019   HCT 24.2 (L) 06/23/2019   MCV 100.0 06/23/2019   PLT 110 (L) 06/23/2019   BMET    Component Value Date/Time   NA 140 06/23/2019 0429   K 3.4 (L) 06/23/2019 0429   CL 103 06/23/2019 0429   CO2 21 (L) 06/23/2019 0429   GLUCOSE 111 (H) 06/23/2019 0429   BUN 32 (H) 06/23/2019 0429   CREATININE 1.10 (H) 06/23/2019 0429   CALCIUM 8.4 (L) 06/23/2019 0429   GFRNONAA 43 (L) 06/23/2019 0429   GFRAA 50 (L) 06/23/2019 0429     Assessment/Plan: 1 Day Post-Op   Principal Problem:   Closed left hip fracture, initial encounter (HCC) Active Problems:   Hyperlipidemia   Essential hypertension   GERD   Prediabetes   Gout   Dementia, senile (HCC)   Thrombocytopenia (HCC)   Anxiety with depression   WBAT with walker DVT ppx: Lovenox, SCDs, TEDS PO pain control PT/OT Dispo: D/C planning   Michelle Gregory 06/23/2019, 1:17 PM   Michelle Can, MD Cell: [phone] [place] [facility] now [facility]  [facility] [address], [address], [place], [place] [address] Phone: [phone] [email or web address] Facebook  [employer]

## 2019-06-23 NOTE — Progress Notes (Signed)
Foley catheter removed at 1230 today. Patient has not voided since foley cath removal. Bladder scan performed and showed less than 100cc of urine in the bladder. MD notified of low urine output. No new orders at this time. Plan to continue with IVF at 100cc/hr and continue to monitor urine output.    1915: patient did void about 70cc of urine via purewick, that was about the average amount shown via bladder scanner. BP also back down a little to 89/50. Night RN instructed to continue to monitor urine output and BP closely and follow MEWS protocol.   Demba Nigh, Fraser Din 06/23/2019 7:50 PM

## 2019-06-23 NOTE — Care Management Important Message (Signed)
Important Message  Patient Details IM Letter given to Cookie McGibboney RN to present to the Patient Name: Michelle Gregory MRN: [id number] Date of Birth: [birth year]   Medicare Important Message Given:  Yes     Kerin Salen 06/23/2019, 10:45 AM

## 2019-06-23 NOTE — Evaluation (Signed)
Physical Therapy Evaluation Patient Details Name: Michelle Gregory MRN: [id number] DOB: 02/21/1923 Today's Date: 06/23/2019   History of Present Illness  85 year old female with a history of anxiety, depression, C. difficile, GERD, colon cancer, recurrent UTIs, hypertension, dementia, osteoarthritis, spinal compression fractures brought to the ED after sustaining a fall at home with the x-ray showing left intertrochanteric femur fracture.  Pt s/p left femur intramedullary fixation on 06/22/19  Clinical Impression  Patient is s/p above surgery resulting in functional limitations due to the deficits listed below (see PT Problem List). Patient will benefit from skilled PT to increase their independence and safety with mobility to allow discharge to the venue listed below.  Pt with hx of compression fractures and taking pain meds daily per daughter.  Pt premedicated before session however unable to tolerate mobility due to pain.  Pt typically supervision-independent at baseline.  Pt would benefit from SNF upon d/c.      Follow Up Recommendations SNF    Equipment Recommendations  Rolling walker with 5" wheels    Recommendations for Other Services       Precautions / Restrictions Precautions Precautions: Fall Restrictions Weight Bearing Restrictions: Yes Other Position/Activity Restrictions: WBAT LLE      Mobility  Bed Mobility Overal bed mobility: Needs Assistance Bed Mobility: Supine to Sit;Sit to Supine     Supine to sit: Total assist;+2 for physical assistance Sit to supine: Total assist;+2 for physical assistance   General bed mobility comments: pain a limiting factor. unable to tolerate bearing weight onto left hip so unable to complete sitting upright.  total assist required due to pain, pt attempting to assist with upper body  Transfers                 General transfer comment: unable due to 10/10 pain   Ambulation/Gait                Stairs             Wheelchair Mobility    Modified Rankin (Stroke Patients Only)       Balance Overall balance assessment: History of Falls(daughter reports pt usually only falls if she has UTI)                                           Pertinent Vitals/Pain Pain Assessment: 0-10 Pain Score: 10-Worst pain ever Pain Location: LLE with bed mobility Pain Descriptors / Indicators: Grimacing;Moaning;Operative site guarding Pain Intervention(s): Limited activity within patient's tolerance;Monitored during session;Premedicated before session;Repositioned    Home Living Family/patient expects to be discharged to:: Unsure Living Arrangements: Alone Available Help at Discharge: Family Type of Home: House Home Access: Stairs to enter Entrance Stairs-Rails: Right;Left;Can reach both [employer]wer of Steps: 5 Home Layout: One level Home Equipment: Cane - single point;Walker - 2 wheels;Walker - 4 wheels;Shower seat;Grab bars - tub/shower;Grab bars - toilet      Prior Function Level of Independence: Independent;Needs assistance   Gait / Transfers Assistance Needed: no AD used  ADL's / Homemaking Assistance Needed: daughter assist with tub transfers  Comments: pt stays with daughter overnight at daughter's house. Pt brought back to her house during the day     Hand Dominance        Extremity/Trunk Assessment   Upper Extremity Assessment Upper Extremity Assessment: Generalized weakness    Lower Extremity Assessment Lower Extremity Assessment: Generalized weakness;RLE  deficits/detail RLE Deficits / Details: required assist for any R LE movement and repositioning RLE: Unable to fully assess due to pain       Communication   Communication: HOH  Cognition Arousal/Alertness: Awake/alert Behavior During Therapy: WFL for tasks assessed/performed                                   General Comments: appropriately conversational (hx of dementia per  chart)      General Comments      Exercises     Assessment/Plan    PT Assessment Patient needs continued PT services  PT Problem List Decreased strength;Decreased mobility;Decreased activity tolerance;Decreased balance;Decreased knowledge of use of DME;Pain       PT Treatment Interventions DME instruction;Therapeutic exercise;Gait training;Balance training;Functional mobility training;Therapeutic activities;Patient/family education    PT Goals (Current goals can be found in the Care Plan section)  Acute Rehab PT Goals Patient Stated Goal: reduce pain PT Goal Formulation: With patient/family Time For Goal Achievement: 07/07/19 Potential to Achieve Goals: Fair    Frequency Min 3X/week   Barriers to discharge        Co-evaluation PT/OT/SLP Co-Evaluation/Treatment: Yes Reason for Co-Treatment: To address functional/ADL transfers;For patient/therapist safety PT goals addressed during session: Mobility/safety with mobility OT goals addressed during session: ADL's and self-care       AM-PAC PT "6 Clicks" Mobility  Outcome Measure Help needed turning from your back to your side while in a flat bed without using bedrails?: Total Help needed moving from lying on your back to sitting on the side of a flat bed without using bedrails?: Total Help needed moving to and from a bed to a chair (including a wheelchair)?: Total Help needed standing up from a chair using your arms (e.g., wheelchair or bedside chair)?: Total Help needed to walk in hospital room?: Total Help needed climbing 3-5 steps with a railing? : Total 6 Click Score: 6    End of Session   Activity Tolerance: Patient limited by pain Patient left: in bed;with call bell/phone within reach;with bed alarm set;with family/visitor present Nurse Communication: Patient requests pain meds PT Visit Diagnosis: Difficulty in walking, not elsewhere classified (R26.2)    Time: [id number] PT Time Calculation (min) (ACUTE  ONLY): 19 min   Charges:   PT Evaluation $PT Eval Low Complexity: [place], PT, DPT Acute Rehabilitation Services Office: [phone] Pager: [phone]   Trena Platt 06/23/2019, 1:31 PM

## 2019-06-23 NOTE — Evaluation (Addendum)
Occupational Therapy Evaluation Patient Details Name: Michelle Gregory MRN: [id number] DOB: 24-Apr-1923 Today's Date: 06/23/2019    History of Present Illness 85 year old female with a history of anxiety, depression, C. difficile, GERD, colon cancer, recurrent UTIs, hypertension, osteoarthritis brought to the ED after sustaining a fall at home with the x-ray showing left intertrochanteric femur fracture.  S/p Intramedullary fixation, Left femur.   Clinical Impression   Pt admitted with the above diagnoses and presents with below problem list. Pt will benefit from continued acute OT to address the below listed deficits and maximize independence with basic ADLs prior to d/c to venue below. PTA pt was independent with ADLs, assist for shower transfers. Pt stays with daughter overnight and then returns back home for the day. Pt limited by 10/10 pain LLE and back. Discussed with pt's daughter working with nurse and MD to figure out a pain medication plan to allow pt to mobilize. Pt unable to bear weight onto LLE, came to partial EOB position with +2 total A before needing to return to supine. Daughter did voice concern with pt going to SNF, that she would not be allowed to visit her. Should pt's pain level reduce and she can mobilize at a level to match family support at home she could potentially d/c home with 24/7 assist. ST SNF seems the more likely d/c route at this point.     Follow Up Recommendations  SNF;Supervision/Assistance - 24 hour    Equipment Recommendations  Other (comment)(TBD)    Recommendations for Other Services       Precautions / Restrictions Precautions Precautions: Fall Restrictions Weight Bearing Restrictions: Yes Other Position/Activity Restrictions: WBAT LLE      Mobility Bed Mobility Overal bed mobility: Needs Assistance Bed Mobility: Supine to Sit;Sit to Supine     Supine to sit: Total assist;+2 for physical assistance Sit to supine: Total assist;+2 for  physical assistance   General bed mobility comments: pain a limiting factor. unable to tolerate bearing weight onto LLe  Transfers                 General transfer comment: unable due to 10/10 pain     Balance                                           ADL either performed or assessed with clinical judgement   ADL Overall ADL's : Needs assistance/impaired Eating/Feeding: Set up;Sitting   Grooming: Set up;Minimal assistance;Sitting   Upper Body Bathing: Maximal assistance;Bed level   Lower Body Bathing: Total assistance;+2 for physical assistance   Upper Body Dressing : Maximal assistance;Bed level   Lower Body Dressing: Total assistance;+2 for physical assistance;Bed level                 General ADL Comments: Partially sat EOB. Unable to bear weight onto LLE. Returned to supine d/t pain.     Vision         Perception     Praxis      Pertinent Vitals/Pain Pain Assessment: 0-10 Pain Score: 10-Worst pain ever Pain Location: LLE with bed mobility Pain Descriptors / Indicators: Grimacing;Moaning;Operative site guarding Pain Intervention(s): Limited activity within patient's tolerance;Monitored during session;Repositioned;Premedicated before session     Hand Dominance     Extremity/Trunk Assessment Upper Extremity Assessment Upper Extremity Assessment: Generalized weakness   Lower Extremity Assessment Lower Extremity Assessment: Defer to  PT evaluation       Communication Communication Communication: HOH   Cognition Arousal/Alertness: Awake/alert Behavior During Therapy: WFL for tasks assessed/performed                                   General Comments: appropriately conversational   General Comments       Exercises     Shoulder Instructions      Home Living Family/patient expects to be discharged to:: Unsure Living Arrangements: Alone Available Help at Discharge: Family Type of Home: House Home  Access: Stairs to enter [employer] of Steps: 5 Entrance Stairs-Rails: Right;Left;Can reach both Home Layout: One level     Bathroom Shower/Tub: Teacher, early years/pre: Handicapped height     Home Equipment: Cane - single point;Walker - 2 wheels;Walker - 4 wheels;Shower seat;Grab bars - tub/shower;Grab bars - toilet          Prior Functioning/Environment Level of Independence: Independent;Needs assistance  Gait / Transfers Assistance Needed: no AD used ADL's / Homemaking Assistance Needed: daughter assist with tub transfers   Comments: pt stays with daughter overnight at daughter's house. Pt brought back to her house during the day        OT Problem List: Decreased activity tolerance;Impaired balance (sitting and/or standing);Decreased knowledge of use of DME or AE;Decreased knowledge of precautions;Pain      OT Treatment/Interventions: Self-care/ADL training;Energy conservation;DME and/or AE instruction;Therapeutic activities;Patient/family education;Balance training    OT Goals(Current goals can be found in the care plan section) Acute Rehab OT Goals Patient Stated Goal: reduce pain OT Goal Formulation: With patient/family Time For Goal Achievement: 07/07/19 Potential to Achieve Goals: Good ADL Goals Pt Will Perform Grooming: with supervision;sitting Pt Will Perform Upper Body Bathing: with set-up;with supervision Pt Will Perform Lower Body Bathing: with mod assist;sit to/from stand Pt Will Perform Upper Body Dressing: with set-up;with min guard assist;sitting Pt Will Perform Lower Body Dressing: with mod assist;sit to/from stand Pt Will Transfer to Toilet: with mod assist;ambulating;stand pivot transfer Pt Will Perform Toileting - Clothing Manipulation and hygiene: with mod assist Additional ADL Goal #1: Pt will complete bed mobility at min guard level to prepare for OOB/EOB ADLs.  OT Frequency: Min 2X/week   Barriers to D/C:             Co-evaluation PT/OT/SLP Co-Evaluation/Treatment: Yes Reason for Co-Treatment: For patient/therapist safety;To address functional/ADL transfers   OT goals addressed during session: ADL's and self-care      AM-PAC OT "6 Clicks" Daily Activity     Outcome Measure Help from another person eating meals?: A Little Help from another person taking care of personal grooming?: A Lot Help from another person toileting, which includes using toliet, bedpan, or urinal?: Total Help from another person bathing (including washing, rinsing, drying)?: Total Help from another person to put on and taking off regular upper body clothing?: A Lot Help from another person to put on and taking off regular lower body clothing?: Total 6 Click Score: 10   End of Session Nurse Communication: Patient requests pain meds;Other (comment)(daughter asking about pain med schedule)  Activity Tolerance: Patient limited by pain Patient left: in bed;with call bell/phone within reach;with bed alarm set;with family/visitor present  OT Visit Diagnosis: Unsteadiness on feet (R26.81);Pain;Muscle weakness (generalized) (M62.81) Pain - Right/Left: Left Pain - part of body: Leg  [id number] OT Time Calculation (min): 19 min Charges:  OT General Charges $OT Visit: 1 Visit OT Evaluation $OT Eval Low Complexity: [place], OT Acute Rehabilitation Services Pager: [phone] Office: [phone]   Hortencia Pilar 06/23/2019, 11:31 AM

## 2019-06-24 DIAGNOSIS — I9589 Other hypotension: Secondary | ICD-10-CM

## 2019-06-24 DIAGNOSIS — E861 Hypovolemia: Secondary | ICD-10-CM

## 2019-06-24 DIAGNOSIS — B9620 Unspecified Escherichia coli [E. coli] as the cause of diseases classified elsewhere: Secondary | ICD-10-CM

## 2019-06-24 DIAGNOSIS — N390 Urinary tract infection, site not specified: Secondary | ICD-10-CM

## 2019-06-24 LAB — CBC
HCT: 21.6 % — ABNORMAL LOW (ref 36.0–46.0)
HCT: 28.2 % — ABNORMAL LOW (ref 36.0–46.0)
Hemoglobin: 6.9 g/dL — CL (ref 12.0–15.0)
Hemoglobin: 9.1 g/dL — ABNORMAL LOW (ref 12.0–15.0)
MCH: 33.3 pg (ref 26.0–34.0)
MCH: 32.2 pg (ref 26.0–34.0)
MCHC: 31.9 g/dL (ref 30.0–36.0)
MCHC: 32.3 g/dL (ref 30.0–36.0)
MCV: 104.3 fL — ABNORMAL HIGH (ref 80.0–100.0)
MCV: 99.6 fL (ref 80.0–100.0)
Platelets: 80 10*3/uL — ABNORMAL LOW (ref 150–400)
Platelets: 78 10*3/uL — ABNORMAL LOW (ref 150–400)
RBC: 2.07 MIL/uL — ABNORMAL LOW (ref 3.87–5.11)
RBC: 2.83 MIL/uL — ABNORMAL LOW (ref 3.87–5.11)
RDW: 13.2 % (ref 11.5–15.5)
RDW: 15 % (ref 11.5–15.5)
WBC: 9.5 10*3/uL (ref 4.0–10.5)
WBC: 9.3 10*3/uL (ref 4.0–10.5)
nRBC: 0 % (ref 0.0–0.2)
nRBC: 0 % (ref 0.0–0.2)

## 2019-06-24 LAB — URINE CULTURE: Culture: >=100000 — AB

## 2019-06-24 LAB — BASIC METABOLIC PANEL
Anion gap: 11 (ref 5–15)
BUN: 32 mg/dL — ABNORMAL HIGH (ref 8–23)
CO2: 23 mmol/L (ref 22–32)
Calcium: 8 mg/dL — ABNORMAL LOW (ref 8.9–10.3)
Chloride: 106 mmol/L (ref 98–111)
Creatinine, Ser: 0.86 mg/dL (ref 0.44–1.00)
GFR calc Af Amer: >60 mL/min (ref >60)
GFR calc non Af Amer: 58 mL/min — ABNORMAL LOW (ref >60)
Glucose, Bld: 105 mg/dL — ABNORMAL HIGH (ref 70–99)
Potassium: 3.7 mmol/L (ref 3.5–5.1)
Sodium: 140 mmol/L (ref 135–145)

## 2019-06-24 LAB — PREPARE RBC (CROSSMATCH)

## 2019-06-24 MED ORDER — SODIUM CHLORIDE 0.9% IV SOLUTION
Freq: Once | INTRAVENOUS | Status: AC
  Administered 2019-06-24: 09:00:00 via INTRAVENOUS

## 2019-06-24 NOTE — Progress Notes (Signed)
Physical Therapy Treatment Patient Details Name: Michelle Gregory MRN: [id number] DOB: 04/20/1923 Today's Date: 06/24/2019    History of Present Illness 85 year old female with a history of anxiety, depression, C. difficile, GERD, colon cancer, recurrent UTIs, hypertension, dementia, osteoarthritis, spinal compression fractures brought to the ED after sustaining a fall at home with the x-ray showing left intertrochanteric femur fracture.  Pt s/p left femur intramedullary fixation on 06/22/19    PT Comments    Pt talkative and pleasantly confused but able to follow all commnads and help assist with bed mobility and sit to stand with pivot transfer to the recliner. Was painful, but pt understands and tries.  Now sitting up in chair resting .   Follow Up Recommendations  SNF     Equipment Recommendations  Rolling walker with 5" wheels    Recommendations for Other Services       Precautions / Restrictions Precautions Precautions: Fall Restrictions LLE Weight Bearing: Weight bearing as tolerated Other Position/Activity Restrictions: WBAT LLE    Mobility  Bed Mobility Overal bed mobility: Needs Assistance Bed Mobility: Supine to Sit;Sit to Supine     Supine to sit: Total assist;+2 for physical assistance Sit to supine: Total assist;+2 for physical assistance   General bed mobility comments: total assist, however pt did assis ta little and improved sittng today. Still leaning to the R a good amount to avoid sitting on left hip and wanted to lie back down at one moment.  Transfers Overall transfer level: Needs assistance Equipment used: Rolling walker (2 wheeled) Transfers: Stand Pivot Transfers   Stand pivot transfers: Max assist;+2 physical assistance       General transfer comment: difficult to come to standing at RW, but once there pt did fairly well with moderate assist to static hold for a moment. then max cues and physical assistance to pivot to recliner. Pt did assist  thought in this pivot and did try to take a few steps with the pivot  Ambulation/Gait                 Stairs             Wheelchair Mobility    Modified Rankin (Stroke Patients Only)       Balance                                            Cognition Arousal/Alertness: Awake/alert Behavior During Therapy: WFL for tasks assessed/performed Overall Cognitive Status: History of cognitive impairments - at baseline                                 General Comments: followed commands, however talking about things not in room and thinking we wre family/friends she new, etc.      Exercises      General Comments        Pertinent Vitals/Pain Pain Assessment: Faces Faces Pain Scale: Hurts whole lot(especially with any LLE movement or mobility) Pain Location: LLE with bed mobility Pain Descriptors / Indicators: Guarding;Grimacing Pain Intervention(s): Monitored during session;Premedicated before session;Ice applied;Repositioned    Home Living                      Prior Function            PT Goals (current goals  can now be found in the care plan section) Progress towards PT goals: Progressing toward goals    Frequency    Min 3X/week      PT Plan Current plan remains appropriate    Co-evaluation              AM-PAC PT "6 Clicks" Mobility   Outcome Measure  Help needed turning from your back to your side while in a flat bed without using bedrails?: A Lot Help needed moving from lying on your back to sitting on the side of a flat bed without using bedrails?: A Lot Help needed moving to and from a bed to a chair (including a wheelchair)?: A Lot Help needed standing up from a chair using your arms (e.g., wheelchair or bedside chair)?: Total Help needed to walk in hospital room?: Total Help needed climbing 3-5 steps with a railing? : Total 6 Click Score: 9    End of Session   Activity Tolerance: Patient  limited by pain Patient left: with call bell/phone within reach;in chair;with chair alarm set Nurse Communication: Patient requests pain meds;Mobility status PT Visit Diagnosis: Difficulty in walking, not elsewhere classified (R26.2)     Time: 1500-1530 PT Time Calculation (min) (ACUTE ONLY): 30 min  Charges:  $Therapeutic Activity: 23-37 mins                     Clide Dales, PT Acute Rehabilitation Services Pager: [phone] Office: [phone] 06/24/2019    Clide Dales 06/24/2019, 4:19 PM

## 2019-06-24 NOTE — Progress Notes (Addendum)
CRITICAL VALUE ALERT  Critical Value: Hgb 6.9  Date & Time Notied: 06/24/19 @ [id number]  Provider Notified: MD notified  Orders Received/Actions taken:none

## 2019-06-24 NOTE — Progress Notes (Signed)
Urging pt to be mobile and to eat. Pt refuses to be OOB or eat. DTR at [facility] and is encouraging pt to eat and follow directions. Will continue to push po's and encourage to be mobile.

## 2019-06-24 NOTE — Progress Notes (Signed)
Pt BP pill was held d/t low numbers. Now very high at 160/80, 168/86. On call MD aware.

## 2019-06-24 NOTE — Progress Notes (Signed)
Spoke to Dr Clementeen Graham r/t lovenox and will proceed w/ dose today  And monitor.

## 2019-06-24 NOTE — Progress Notes (Addendum)
PROGRESS NOTE                                                                                                                                                                                                             Patient Demographics:    Michelle Gregory, is a 85 y.o. female, DOB - 1923/03/04, [id number]  Admit date - 06/21/2019   Admitting Physician Reubin Milan, MD  Outpatient Primary MD for the patient is Tower, [name], MD  LOS - 3   Chief Complaint  Patient presents with   Fall       Brief Narrative   85 year old female with a history of anxiety, depression, C. difficile, GERD, colon cancer, recurrent UTIs, hypertension, osteoarthritis brought to the ED after sustaining a fall at home with the x-ray showing left intertrochanteric femur fracture.  Patient seen by orthopedics and taken to the OR for intramedullary fixation of the left femur.   Subjective:   Seen and examined.  Again quite confused this morning.  Yesterday had very low urine output during the day given IV fluid bolus followed by maintenance fluid.  Also had low blood pressure for which home BP meds were held.  Has good urine output this morning.  Noted for drop in H&H.   Assessment  & Plan :    Principal Problem:   Closed left hip fracture, initial encounter [facility]) Status post left femur intramedullary fixation.  Tolerated surgery well. Pain control with PRN Norco.  (Monitor dosing due to confusion).  Continue bowel regimen.  DVT prophylaxis with subcu Lovenox. Foley discontinued.  PT recommends SNF.   Active Problems: Acute blood loss anemia/thrombocytopenia Further drop in hemoglobin to 6.9.  (Baseline of 12).  Likely postsurgical blood loss.  Will transfuse with 1 unit PRBC.  Monitor platelets closely (also noted to drop to 80).  Hypotension and oliguria Possibly due to hypovolemia.  Blood pressure meds held and given IV fluids with  improvement in both.  Monitor closely.   Leukocytosis Resolved.  Reactive + associated with UTI.  E. coli urinary tract infection Pansensitive.  On empiric Rocephin.  Hypokalemia/hypomagnesemia Replenished   GERD Continue PPI  Gout Continue allopurinol.  Stable  Delirium Likely sundowning and associated with infection/pain.  Monitor closely.  Use narcotics only as needed.  Avoid benzos.    Code Status :  Full  Family Communication  : None at bedside.  We will update daughter.  Disposition Plan  : SNF possibly on 8/24 if stable (anemia, delirium).  Barriers For Discharge : Active symptoms  Consults  : Orthopedics (Dr. Lyla Glassing)  Procedures  : IM nailing of left femur fracture  DVT Prophylaxis  :  Lovenox -   Lab Results  Component Value Date   PLT 80 (L) 06/24/2019    Antibiotics  :    Anti-infectives (From admission, onward)   Start     Dose/Rate Route Frequency Ordered Stop   06/23/19 1100  cefTRIAXone (ROCEPHIN) 1 g in sodium chloride 0.9 % 100 mL IVPB  Status:  Discontinued     1 g 200 mL/hr over 30 Minutes Intravenous Every 24 hours 06/23/19 1048 06/23/19 1049   06/23/19 0100  cefTRIAXone (ROCEPHIN) 1 g in sodium chloride 0.9 % 100 mL IVPB     1 g 200 mL/hr over 30 Minutes Intravenous Every 24 hours 06/22/19 0727     06/22/19 1430  ceFAZolin (ANCEF) IVPB 2g/100 mL premix     2 g 200 mL/hr over 30 Minutes Intravenous Every 6 hours 06/22/19 1130 06/22/19 2209   06/22/19 0715  ceFAZolin (ANCEF) IVPB 2g/100 mL premix     2 g 200 mL/hr over 30 Minutes Intravenous On call to O.R. 06/22/19 0710 06/22/19 0834   06/22/19 0030  cefTRIAXone (ROCEPHIN) 1 g in sodium chloride 0.9 % 100 mL IVPB     1 g 200 mL/hr over 30 Minutes Intravenous  Once 06/22/19 0017 06/22/19 0131        Objective:   Vitals:   06/24/19 0658 06/24/19 0925 06/24/19 0945 06/24/19 1016  BP: (!) 148/72 (!) 177/94 (!) 162/87 (!) 161/84  Pulse: (!) 103 (!) 120 (!) 110 (!) 102  Resp: 16 18  16 18   Temp: 98.3 F (36.8 C) 98.5 F (36.9 C) 98.5 F (36.9 C) 98.4 F (36.9 C)  TempSrc:  Axillary Axillary Axillary  SpO2: (!) 89% 100% 100% 99%  Weight:      Height:        Wt Readings from Last 3 Encounters:  06/22/19 54.4 kg  01/23/19 51 kg  09/01/18 53 kg     Intake/Output Summary (Last 24 hours) at 06/24/2019 1028 Last data filed at 06/24/2019 0600 Gross per 24 hour  Intake 2604.18 ml  Output 220 ml  Net 2384.18 ml   Physical exam Elderly female, confused HEENT: Moist mucosa, supple neck Chest: Clear bilaterally CVs: Normal S1-S2, no murmurs GI: Soft, nondistended nontender, bowel sounds present Musculoskeletal: Warm, clean dressing over the left hip CNS: AAO x0.     Data Review:    CBC Recent Labs  Lab 06/21/19 1911 06/22/19 1148 06/23/19 0429 06/24/19 0425  WBC 13.7* 14.2* 16.0* 9.5  HGB 12.1 9.7* 8.0* 6.9*  HCT 36.4 30.2* 24.2* 21.6*  PLT 142* PLATELET CLUMPS NOTED ON SMEAR, UNABLE TO ESTIMATE 110* 80*  MCV 99.7 103.1* 100.0 104.3*  MCH 33.2 33.1 33.1 33.3  MCHC 33.2 32.1 33.1 31.9  RDW 12.7 13.0 13.1 13.2  LYMPHSABS 0.9  --   --   --   MONOABS 0.9  --   --   --   EOSABS 0.0  --   --   --   BASOSABS 0.0  --   --   --     Chemistries  Recent Labs  Lab 06/21/19 1911 06/22/19 1149 06/23/19 0429 06/24/19 0425  NA 138  --  140 140  K 3.9  --  3.4* 3.7  CL 104  --  103 106  CO2 22  --  21* 23  GLUCOSE 145*  --  111* 105*  BUN 22  --  32* 32*  CREATININE 0.74 1.03* 1.10* 0.86  CALCIUM 8.6*  --  8.4* 8.0*  MG 1.5*  --   --   --   AST 24  --   --   --   ALT 14  --   --   --   ALKPHOS 44  --   --   --   BILITOT 1.0  --   --   --    ------------------------------------------------------------------------------------------------------------------ No results for input(s): CHOL, HDL, LDLCALC, TRIG, CHOLHDL, LDLDIRECT in the last 72 hours.  Lab Results  Component Value Date   HGBA1C 5.6 02/01/2019    ------------------------------------------------------------------------------------------------------------------ No results for input(s): TSH, T4TOTAL, T3FREE, THYROIDAB in the last 72 hours.  Invalid input(s): FREET3 ------------------------------------------------------------------------------------------------------------------ No results for input(s): VITAMINB12, FOLATE, FERRITIN, TIBC, IRON, RETICCTPCT in the last 72 hours.  Coagulation profile Recent Labs  Lab 06/22/19 0247 06/22/19 1148  INR 1.1 1.1    No results for input(s): DDIMER in the last 72 hours.  Cardiac Enzymes No results for input(s): CKMB, TROPONINI, MYOGLOBIN in the last 168 hours.  Invalid input(s): CK ------------------------------------------------------------------------------------------------------------------ No results found for: BNP  Inpatient Medications  Scheduled Meds:  allopurinol  100 mg Oral BID   aspirin EC  81 mg Oral Daily   docusate sodium  100 mg Oral BID   enoxaparin (LOVENOX) injection  30 mg Subcutaneous Q24H   gabapentin  300 mg Oral BID   PARoxetine  30 mg Oral q morning - 10a   raloxifene  60 mg Oral Daily   senna  1 tablet Oral BID   Continuous Infusions:  cefTRIAXone (ROCEPHIN)  IV Stopped (06/24/19 0200)   lactated ringers 100 mL/hr at 06/24/19 0600   PRN Meds:.HYDROcodone-acetaminophen, HYDROmorphone (DILAUDID) injection, meclizine, menthol-cetylpyridinium **OR** phenol, metoCLOPramide **OR** metoCLOPramide (REGLAN) injection, ondansetron **OR** ondansetron (ZOFRAN) IV  Micro Results Recent Results (from the past 240 hour(s))  Urine culture     Status: Abnormal   Collection Time: 06/21/19  6:04 PM   Specimen: Urine, Clean Catch  Result Value Ref Range Status   Specimen Description   Final    URINE, CLEAN CATCH Performed at [facility], [place] [address], [place], [place] [address]    Special Requests   Final    NONE Performed at  [facility], [place] [address] [place], [place] [address]    Culture >=100,000 COLONIES/mL ESCHERICHIA COLI (A)  Final   Report Status 06/24/2019 FINAL  Final   Organism ID, Bacteria ESCHERICHIA COLI (A)  Final      Susceptibility   Escherichia coli - MIC*    AMPICILLIN 4 SENSITIVE Sensitive     CEFAZOLIN <=4 SENSITIVE Sensitive     CEFTRIAXONE <=1 SENSITIVE Sensitive     CIPROFLOXACIN <=0.25 SENSITIVE Sensitive     GENTAMICIN <=1 SENSITIVE Sensitive     IMIPENEM <=0.25 SENSITIVE Sensitive     NITROFURANTOIN 32 SENSITIVE Sensitive     TRIMETH/SULFA <=20 SENSITIVE Sensitive     AMPICILLIN/SULBACTAM <=2 SENSITIVE Sensitive     PIP/TAZO <=4 SENSITIVE Sensitive     Extended ESBL NEGATIVE Sensitive     * >=100,000 COLONIES/mL ESCHERICHIA COLI  SARS Coronavirus 2 [facility] order, Performed in [facility] hospital lab) Nasopharyngeal Nasopharyngeal Swab  Status: None   Collection Time: 06/21/19  6:05 PM   Specimen: Nasopharyngeal Swab  Result Value Ref Range Status   SARS Coronavirus 2 NEGATIVE NEGATIVE Final    Comment: (NOTE) If result is NEGATIVE SARS-CoV-2 target nucleic acids are NOT DETECTED. The SARS-CoV-2 RNA is generally detectable in upper and lower  respiratory specimens during the acute phase of infection. The lowest  concentration of SARS-CoV-2 viral copies this assay can detect is 250  copies / mL. A negative result does not preclude SARS-CoV-2 infection  and should not be used as the sole basis for treatment or other  patient management decisions.  A negative result may occur with  improper specimen collection / handling, submission of specimen other  than nasopharyngeal swab, presence of viral mutation(s) within the  areas targeted by this assay, and inadequate number of viral copies  (<250 copies / mL). A negative result must be combined with clinical  observations, patient history, and epidemiological information. If result is POSITIVE SARS-CoV-2  target nucleic acids are DETECTED. The SARS-CoV-2 RNA is generally detectable in upper and lower  respiratory specimens dur ing the acute phase of infection.  Positive  results are indicative of active infection with SARS-CoV-2.  Clinical  correlation with patient history and other diagnostic information is  necessary to determine patient infection status.  Positive results do  not rule out bacterial infection or co-infection with other viruses. If result is PRESUMPTIVE POSTIVE SARS-CoV-2 nucleic acids MAY BE PRESENT.   A presumptive positive result was obtained on the submitted specimen  and confirmed on repeat testing.  While 2019 novel coronavirus  (SARS-CoV-2) nucleic acids may be present in the submitted sample  additional confirmatory testing may be necessary for epidemiological  and / or clinical management purposes  to differentiate between  SARS-CoV-2 and other Sarbecovirus currently known to infect humans.  If clinically indicated additional testing with an alternate test  methodology [phone]) is advised. The SARS-CoV-2 RNA is generally  detectable in upper and lower respiratory sp ecimens during the acute  phase of infection. The expected result is Negative. Fact Sheet for Patients:  StrictlyIdeas.no Fact Sheet for Healthcare Providers: BankingDealers.co.za This test is not yet approved or cleared by the [place] FDA and has been authorized for detection and/or diagnosis of SARS-CoV-2 by FDA under an Emergency Use Authorization (EUA).  This EUA will remain in effect (meaning this test can be used) for the duration of the COVID-19 declaration under Section 564(b)(1) of the Act, 21 U.S.C. section 360bbb-3(b)(1), unless the authorization is terminated or revoked sooner. Performed at [facility], [place] [address], [place], [place] [address]     Radiology Reports Dg Tibia/fibula Right  Result Date:  06/23/2019 CLINICAL DATA:  Right lower leg injury and pain after a fall 06/21/2019. EXAM: RIGHT TIBIA AND FIBULA - 2 VIEW COMPARISON:  None. FINDINGS: No acute bony or joint abnormality is identified. The patient has advanced tricompartmental osteoarthritis of the right knee. Soft tissues are unremarkable. IMPRESSION: No acute abnormality. Advanced osteoarthritis right knee. Electronically Signed   By: Inge Rise M.D.   On: 06/23/2019 09:55   Ct Head Wo Contrast  Result Date: 06/22/2019 CLINICAL DATA:  Fall.  Headache. EXAM: CT HEAD WITHOUT CONTRAST CT CERVICAL SPINE WITHOUT CONTRAST TECHNIQUE: Multidetector CT imaging of the head and cervical spine was performed following the standard protocol without intravenous contrast. Multiplanar CT image reconstructions of the cervical spine were also generated. COMPARISON:  CT head 03/19/2019 FINDINGS: CT HEAD  FINDINGS Brain: Generalized atrophy.  Mild white matter changes. Negative for acute infarct, hemorrhage, mass.  No midline shift. Vascular: Negative for hyperdense vessel. Atherosclerotic calcification. Skull: Negative for skull fracture Sinuses/Orbits: Mild mucosal edema paranasal sinuses. Bilateral cataract surgery. Other: None CT CERVICAL SPINE FINDINGS Alignment: Mild anterolisthesis C4-5. Straightening of the cervical lordosis. Skull base and vertebrae: Negative for fracture Soft tissues and spinal canal: Atherosclerotic carotid bifurcation bilaterally. No soft tissue mass Disc levels: Multilevel cervical spondylosis with disc degeneration and facet degeneration. Disc space narrowing and spurring most prominent C4-5 and C5-6. Moderate spinal stenosis at C5-6. Upper chest: Lung apices clear bilaterally. Apically scarring bilaterally. Other: None IMPRESSION: 1. Atrophy and chronic microvascular ischemia. No acute intracranial abnormality. 2. Cervical spondylosis without acute fracture. Electronically Signed   By: Franchot Gallo M.D.   On: 06/22/2019 15:48     Ct Cervical Spine Wo Contrast  Result Date: 06/22/2019 CLINICAL DATA:  Fall.  Headache. EXAM: CT HEAD WITHOUT CONTRAST CT CERVICAL SPINE WITHOUT CONTRAST TECHNIQUE: Multidetector CT imaging of the head and cervical spine was performed following the standard protocol without intravenous contrast. Multiplanar CT image reconstructions of the cervical spine were also generated. COMPARISON:  CT head 03/19/2019 FINDINGS: CT HEAD FINDINGS Brain: Generalized atrophy.  Mild white matter changes. Negative for acute infarct, hemorrhage, mass.  No midline shift. Vascular: Negative for hyperdense vessel. Atherosclerotic calcification. Skull: Negative for skull fracture Sinuses/Orbits: Mild mucosal edema paranasal sinuses. Bilateral cataract surgery. Other: None CT CERVICAL SPINE FINDINGS Alignment: Mild anterolisthesis C4-5. Straightening of the cervical lordosis. Skull base and vertebrae: Negative for fracture Soft tissues and spinal canal: Atherosclerotic carotid bifurcation bilaterally. No soft tissue mass Disc levels: Multilevel cervical spondylosis with disc degeneration and facet degeneration. Disc space narrowing and spurring most prominent C4-5 and C5-6. Moderate spinal stenosis at C5-6. Upper chest: Lung apices clear bilaterally. Apically scarring bilaterally. Other: None IMPRESSION: 1. Atrophy and chronic microvascular ischemia. No acute intracranial abnormality. 2. Cervical spondylosis without acute fracture. Electronically Signed   By: Franchot Gallo M.D.   On: 06/22/2019 15:48   Ct Lumbar Spine Wo Contrast  Result Date: 06/22/2019 CLINICAL DATA:  Fall.  Back pain.  History of fracture. EXAM: CT LUMBAR SPINE WITHOUT CONTRAST TECHNIQUE: Multidetector CT imaging of the lumbar spine was performed without intravenous contrast administration. Multiplanar CT image reconstructions were also generated. COMPARISON:  CT lumbar 04/02/2018 FINDINGS: Segmentation: Normal Alignment: Approximately 10 mm retrolisthesis L1  relative to L3. Burst fracture L2 unchanged. Mild anterolisthesis L3-4 L4-5 Vertebrae: Moderate compression fracture T12 is chronic and unchanged Fracture of the inferior endplate anteriorly of L1 is chronic and unchanged Severe burst fracture of L2 is unchanged with cement in the anterior vertebral body. There is extensive retropulsion of bone into the canal unchanged. No acute fracture.  Chronic fractures of the sacrum bilaterally. Paraspinal and other soft tissues: Negative for paraspinous mass or edema. Disc levels: T11-12: Mild to moderate spinal stenosis due to T12 fracture and spurring T12-L1: Disc and facet degeneration without stenosis L1-2: Severe spinal stenosis due to retropulsion of L2 into the canal. Spinal stenosis unchanged from the prior study. Bilateral facet degeneration contributes to stenosis L2-3: Mild disc and facet degeneration without significant spinal stenosis L3-4: Severe facet degeneration and diffuse disc bulging. Moderate spinal stenosis unchanged L4-5: Severe facet degeneration. Diffuse disc bulging. Moderate spinal stenosis and moderate to severe subarticular stenosis bilaterally without interval change. L5-S1: Severe facet degeneration and mild disc degeneration. Decompressive laminectomy without significant spinal stenosis. IMPRESSION: Negative for acute  lumbar fracture Chronic compression fractures T12, L1, L2. Chronic burst fracture L2 with retropulsion of bone in the canal and severe spinal stenosis unchanged from 1 year ago. Multilevel degenerative change and spinal stenosis throughout the lumbar spine as above. Electronically Signed   By: Franchot Gallo M.D.   On: 06/22/2019 15:55   Pelvis Portable  Result Date: 06/22/2019 CLINICAL DATA:  Postop left femur EXAM: PORTABLE PELVIS 1-2 VIEWS COMPARISON:  06/21/2019 FINDINGS: Internal fixation across the left femoral intertrochanteric fracture. Lesser trochanter remains displaced, otherwise anatomic alignment. No visible  hardware complicating feature. IMPRESSION: Internal fixation across the left intertrochanteric fracture as above. Electronically Signed   By: Rolm Baptise M.D.   On: 06/22/2019 11:32   Chest Portable 1 View  Result Date: 06/22/2019 CLINICAL DATA:  Preop evaluation for upcoming hip surgery EXAM: PORTABLE CHEST 1 VIEW COMPARISON:  03/19/2019 FINDINGS: Cardiac shadow is at the upper limits of normal in size. Aortic calcifications are noted. The lungs are well aerated bilaterally. No acute bony abnormality is seen. Healing rib fractures are noted bilaterally. IMPRESSION: No acute abnormality noted. Electronically Signed   By: Inez Catalina M.D.   On: 06/22/2019 08:01   Dg Knee Complete 4 Views Left  Result Date: 06/22/2019 CLINICAL DATA:  Left hip fracture EXAM: LEFT KNEE - COMPLETE 4+ VIEW COMPARISON:  None. FINDINGS: Moderate left knee joint effusion. No acute fracture or traumatic malalignment. Mild tricompartmental degenerative changes with bilateral meniscal chondrocalcinosis. Additional mineralization along the lateral joint line demonstrates a in arcs and whorled chondroid matrix which may reflect an intra-articular body which could be sequela of synovial chondromatosis or prior injury versus other heterotopic ossification. Extensive atherosclerotic calcification. Remaining soft tissues are unremarkable. IMPRESSION: 1. Mild tricompartmental degenerative changes. No acute osseous abnormality. 2. Moderate left knee joint effusion. 3. Nonspecific mineralization along the lateral joint line, synovial chondromatosis versus posttraumatic loose body versus heterotopic calcification Electronically Signed   By: Lovena Le M.D.   On: 06/22/2019 19:59   Dg C-arm 1-60 Min-no Report  Result Date: 06/22/2019 Fluoroscopy was utilized by the requesting physician.  No radiographic interpretation.   Dg Hip Operative Unilat W Or W/o Pelvis Left  Result Date: 06/22/2019 CLINICAL DATA:  Femur fracture EXAM: OPERATIVE  left HIP (WITH PELVIS IF PERFORMED) 2 VIEWS TECHNIQUE: Fluoroscopic spot image(s) were submitted for interpretation post-operatively. COMPARISON:  06/21/2019 FINDINGS: Two low resolution intraoperative spot views of the left hip. Intramedullary rod and screw fixation of comminuted left intertrochanteric fracture with slightly displaced lesser trochanteric fracture fragment. Total fluoroscopy time was 48 seconds IMPRESSION: Intraoperative fluoroscopic assistance provided during surgical fixation of left femur fracture Electronically Signed   By: Donavan Foil M.D.   On: 06/22/2019 15:00   Dg Hip Unilat With Pelvis 2-3 Views Left  Result Date: 06/21/2019 CLINICAL DATA:  85 year old who fell earlier this evening and was found by her family on the floor at home. LEFT hip injury with pain. Initial encounter. EXAM: DG HIP (WITH OR WITHOUT PELVIS) 2-3V LEFT COMPARISON:  04/02/2018. FINDINGS: Comminuted intertrochanteric LEFT femoral neck fracture into multiple parts. Hip joint anatomically aligned with severe SUPERIOR joint space narrowing. Included AP pelvis demonstrates no fractures elsewhere. Sacroiliac joints and symphysis pubis anatomically aligned. Severe joint space narrowing involving the contralateral RIGHT hip. IMPRESSION: 1. Acute traumatic comminuted intertrochanteric LEFT femoral neck fracture. 2. Anatomic alignment the LEFT hip joint with severe SUPERIOR joint space narrowing. 3. Severe osteoarthritis involving the contralateral RIGHT hip. Electronically Signed   By: Evangeline Dakin  M.D.   On: 06/21/2019 18:46   Dg Femur Min 2 Views Left  Result Date: 06/23/2019 CLINICAL DATA:  Fall.  Left hip fracture EXAM: Left femur-two view: COMPARISON:  Hip series performed earlier today FINDINGS: The previously described left femoral intertrochanteric fracture with comminution and varus angulation again noted. No additional bony abnormality within the left femur. The left kidney is not completely visualized.  IMPRESSION: Comminuted left proximal femoral intertrochanteric fracture with varus angulation. Electronically Signed   By: Rolm Baptise M.D.   On: 06/22/2019 16:12    Time Spent in minutes 35   Treg Diemer M.D on 06/24/2019 at 10:28 AM  Between 7am to 7pm - Pager - [phone]  After 7pm go to [email or web address] - password [email or web address]  [facility] -  Office  [phone]

## 2019-06-24 NOTE — Progress Notes (Addendum)
Pt had foley out yesterday and output is being monitored. Had  < 50 cc out put and a wet pad. Scanned her bladder it had 340cc . MD aware.   IN and out cath done had 450cc

## 2019-06-25 LAB — CBC
HCT: 28.6 % — ABNORMAL LOW (ref 36.0–46.0)
Hemoglobin: 9.4 g/dL — ABNORMAL LOW (ref 12.0–15.0)
MCH: 32.2 pg (ref 26.0–34.0)
MCHC: 32.9 g/dL (ref 30.0–36.0)
MCV: 97.9 fL (ref 80.0–100.0)
Platelets: 99 10*3/uL — ABNORMAL LOW (ref 150–400)
RBC: 2.92 MIL/uL — ABNORMAL LOW (ref 3.87–5.11)
RDW: 15.1 % (ref 11.5–15.5)
WBC: 10 10*3/uL (ref 4.0–10.5)
nRBC: 0 % (ref 0.0–0.2)

## 2019-06-25 MED ORDER — LOSARTAN POTASSIUM 50 MG PO TABS: 100.0000 mg | ORAL_TABLET | Freq: Every day | ORAL | Status: DC

## 2019-06-25 MED ORDER — METOPROLOL TARTRATE 50 MG PO TABS
50.0000 mg | ORAL_TABLET | Freq: Two times a day (BID) | ORAL | Status: DC
  Administered 2019-06-25 – 2019-06-26 (×4): 50 mg via ORAL
  Filled 2019-06-25 (×4): qty 1

## 2019-06-25 MED ORDER — LOSARTAN POTASSIUM 50 MG PO TABS
100.0000 mg | ORAL_TABLET | Freq: Every day | ORAL | Status: DC
  Administered 2019-06-25 – 2019-06-26 (×2): 100 mg via ORAL
  Filled 2019-06-25 (×2): qty 2

## 2019-06-25 NOTE — Progress Notes (Signed)
CRITICAL VALUE ALERT  Critical Value:  BP 179/101  Date & Time Notied: [id number] 06/25/2019 Provider Notified: MD Kennon Holter  Orders Received/Actions taken: assessment and monitoring

## 2019-06-25 NOTE — Progress Notes (Signed)
PROGRESS NOTE                                                                                                                                                                                                             Patient Demographics:    Michelle Gregory, is a 85 y.o. female, DOB - July 11, 1923, [id number]  Admit date - 06/21/2019   Admitting Physician Reubin Milan, MD  Outpatient Primary MD for the patient is Tower, [name], MD  LOS - 4   Chief Complaint  Patient presents with  . Fall       Brief Narrative   85 year old female with a history of anxiety, depression, C. difficile, GERD, colon cancer, recurrent UTIs, hypertension, osteoarthritis brought to the ED after sustaining a fall at home with the x-ray showing left intertrochanteric femur fracture.  Patient seen by orthopedics and taken to the OR for intramedullary fixation of the left femur.   Subjective:   Much alert and oriented today.  Elevated blood pressure.  Reports some pain in her left hip.   Assessment  & Plan :    Principal Problem:   Closed left hip fracture, initial encounter [facility]) Status post left femur intramedullary fixation.  Tolerated surgery well. Pain control with PRN Norco. Continue bowel regimen.  DVT prophylaxis with subcu Lovenox. PT recommends SNF.   Active Problems: Acute blood loss anemia/thrombocytopenia Postsurgical blood loss with drop in hemoglobin to 6.9.  (Baseline of 12).  Improved to >9 with 1 unit PRBC. Platelets improved to 99.  Hypotension and oliguria Possibly due to hypovolemia.  Blood pressure meds were held and given IV fluids with improvement.  Now has elevated blood pressure.  Resumed home meds.     Leukocytosis Resolved.  Reactive + associated with UTI.  E. coli urinary tract infection Pansensitive.  Empiric Rocephin (will complete 3-day dose today).  Hypokalemia/hypomagnesemia Replenished    GERD Continue PPI  Gout Continue allopurinol.  Stable  Delirium Likely sundowning and associated with infection/pain.  Improved today.   Code Status : Full  Family Communication  : Spoke with daughter at bedside on 8/22.  Disposition Plan  : SNF possibly on 8/24 if continues to improve.  Barriers For Discharge : Active symptoms  Consults  : Orthopedics (Dr. Lyla Glassing)  Procedures  : IM nailing of left femur fracture  DVT Prophylaxis  :  Lovenox -   Lab Results  Component Value Date   PLT 99 (L) 06/25/2019    Antibiotics  :    Anti-infectives (From admission, onward)   Start     Dose/Rate Route Frequency Ordered Stop   06/23/19 1100  cefTRIAXone (ROCEPHIN) 1 g in sodium chloride 0.9 % 100 mL IVPB  Status:  Discontinued     1 g 200 mL/hr over 30 Minutes Intravenous Every 24 hours 06/23/19 1048 06/23/19 1049   06/23/19 0100  cefTRIAXone (ROCEPHIN) 1 g in sodium chloride 0.9 % 100 mL IVPB     1 g 200 mL/hr over 30 Minutes Intravenous Every 24 hours 06/22/19 0727     06/22/19 1430  ceFAZolin (ANCEF) IVPB 2g/100 mL premix     2 g 200 mL/hr over 30 Minutes Intravenous Every 6 hours 06/22/19 1130 06/22/19 2209   06/22/19 0715  ceFAZolin (ANCEF) IVPB 2g/100 mL premix     2 g 200 mL/hr over 30 Minutes Intravenous On call to O.R. 06/22/19 0710 06/22/19 0834   06/22/19 0030  cefTRIAXone (ROCEPHIN) 1 g in sodium chloride 0.9 % 100 mL IVPB     1 g 200 mL/hr over 30 Minutes Intravenous  Once 06/22/19 0017 06/22/19 0131        Objective:   Vitals:   06/24/19 1600 06/24/19 2121 06/24/19 2149 06/25/19 0541  BP:  (!) 160/80 (!) 168/86 (!) 162/86  Pulse: (!) 103 95  (!) 101  Resp:  18  20  Temp:  100 F (37.8 C) 99.1 F (37.3 C) 98.8 F (37.1 C)  TempSrc:  Oral Oral Oral  SpO2:  95%  94%  Weight:      Height:        Wt Readings from Last 3 Encounters:  06/22/19 54.4 kg  01/23/19 51 kg  09/01/18 53 kg     Intake/Output Summary (Last 24 hours) at 06/25/2019 1249  Last data filed at 06/25/2019 0400 Gross per 24 hour  Intake 2220.23 ml  Output 950 ml  Net 1270.23 ml   Physical exam Elderly female not in distress, better oriented today. HEENT: Moist mucosa, supple neck Chest: Clear CVs: Normal S1-S2 GI: Soft, nondistended, nontender Musculoskeletal: Clean dressing over left hip     Data Review:    CBC Recent Labs  Lab 06/21/19 1911 06/22/19 1148 06/23/19 0429 06/24/19 0425 06/24/19 1655 06/25/19 0550  WBC 13.7* 14.2* 16.0* 9.5 9.3 10.0  HGB 12.1 9.7* 8.0* 6.9* 9.1* 9.4*  HCT 36.4 30.2* 24.2* 21.6* 28.2* 28.6*  PLT 142* PLATELET CLUMPS NOTED ON SMEAR, UNABLE TO ESTIMATE 110* 80* 78* 99*  MCV 99.7 103.1* 100.0 104.3* 99.6 97.9  MCH 33.2 33.1 33.1 33.3 32.2 32.2  MCHC 33.2 32.1 33.1 31.9 32.3 32.9  RDW 12.7 13.0 13.1 13.2 15.0 15.1  LYMPHSABS 0.9  --   --   --   --   --   MONOABS 0.9  --   --   --   --   --   EOSABS 0.0  --   --   --   --   --   BASOSABS 0.0  --   --   --   --   --     Chemistries  Recent Labs  Lab 06/21/19 1911 06/22/19 1149 06/23/19 0429 06/24/19 0425  NA 138  --  140 140  K 3.9  --  3.4* 3.7  CL 104  --  103 106  CO2 22  --  21* 23  GLUCOSE 145*  --  111* 105*  BUN 22  --  32* 32*  CREATININE 0.74 1.03* 1.10* 0.86  CALCIUM 8.6*  --  8.4* 8.0*  MG 1.5*  --   --   --   AST 24  --   --   --   ALT 14  --   --   --   ALKPHOS 44  --   --   --   BILITOT 1.0  --   --   --    ------------------------------------------------------------------------------------------------------------------ No results for input(s): CHOL, HDL, LDLCALC, TRIG, CHOLHDL, LDLDIRECT in the last 72 hours.  Lab Results  Component Value Date   HGBA1C 5.6 02/01/2019   ------------------------------------------------------------------------------------------------------------------ No results for input(s): TSH, T4TOTAL, T3FREE, THYROIDAB in the last 72 hours.  Invalid input(s): FREET3  ------------------------------------------------------------------------------------------------------------------ No results for input(s): VITAMINB12, FOLATE, FERRITIN, TIBC, IRON, RETICCTPCT in the last 72 hours.  Coagulation profile Recent Labs  Lab 06/22/19 0247 06/22/19 1148  INR 1.1 1.1    No results for input(s): DDIMER in the last 72 hours.  Cardiac Enzymes No results for input(s): CKMB, TROPONINI, MYOGLOBIN in the last 168 hours.  Invalid input(s): CK ------------------------------------------------------------------------------------------------------------------ No results found for: BNP  Inpatient Medications  Scheduled Meds: . allopurinol  100 mg Oral BID  . aspirin EC  81 mg Oral Daily  . docusate sodium  100 mg Oral BID  . enoxaparin (LOVENOX) injection  30 mg Subcutaneous Q24H  . gabapentin  300 mg Oral BID  . losartan  100 mg Oral Daily  . metoprolol tartrate  50 mg Oral BID  . senna  1 tablet Oral BID   Continuous Infusions: . cefTRIAXone (ROCEPHIN)  IV Stopped (06/25/19 0127)  . lactated ringers 100 mL/hr at 06/25/19 0400   PRN Meds:.HYDROcodone-acetaminophen, HYDROmorphone (DILAUDID) injection, meclizine, menthol-cetylpyridinium **OR** phenol, ondansetron **OR** ondansetron (ZOFRAN) IV  Micro Results Recent Results (from the past 240 hour(s))  Urine culture     Status: Abnormal   Collection Time: 06/21/19  6:04 PM   Specimen: Urine, Clean Catch  Result Value Ref Range Status   Specimen Description   Final    URINE, CLEAN CATCH Performed at [facility], [place] [address] [place], [place] [address]    Special Requests   Final    NONE Performed at [facility], [place] [address], [place], [place] [address]    Culture >=100,000 COLONIES/mL ESCHERICHIA COLI (A)  Final   Report Status 06/24/2019 FINAL  Final   Organism ID, Bacteria ESCHERICHIA COLI (A)  Final      Susceptibility   Escherichia coli - MIC*     AMPICILLIN 4 SENSITIVE Sensitive     CEFAZOLIN <=4 SENSITIVE Sensitive     CEFTRIAXONE <=1 SENSITIVE Sensitive     CIPROFLOXACIN <=0.25 SENSITIVE Sensitive     GENTAMICIN <=1 SENSITIVE Sensitive     IMIPENEM <=0.25 SENSITIVE Sensitive     NITROFURANTOIN 32 SENSITIVE Sensitive     TRIMETH/SULFA <=20 SENSITIVE Sensitive     AMPICILLIN/SULBACTAM <=2 SENSITIVE Sensitive     PIP/TAZO <=4 SENSITIVE Sensitive     Extended ESBL NEGATIVE Sensitive     * >=100,000 COLONIES/mL ESCHERICHIA COLI  SARS Coronavirus 2 [facility] order, Performed in [facility] hospital lab) Nasopharyngeal Nasopharyngeal Swab     Status: None   Collection Time: 06/21/19  6:05 PM   Specimen: Nasopharyngeal Swab  Result Value Ref Range Status   SARS Coronavirus  2 NEGATIVE NEGATIVE Final    Comment: (NOTE) If result is NEGATIVE SARS-CoV-2 target nucleic acids are NOT DETECTED. The SARS-CoV-2 RNA is generally detectable in upper and lower  respiratory specimens during the acute phase of infection. The lowest  concentration of SARS-CoV-2 viral copies this assay can detect is 250  copies / mL. A negative result does not preclude SARS-CoV-2 infection  and should not be used as the sole basis for treatment or other  patient management decisions.  A negative result may occur with  improper specimen collection / handling, submission of specimen other  than nasopharyngeal swab, presence of viral mutation(s) within the  areas targeted by this assay, and inadequate number of viral copies  (<250 copies / mL). A negative result must be combined with clinical  observations, patient history, and epidemiological information. If result is POSITIVE SARS-CoV-2 target nucleic acids are DETECTED. The SARS-CoV-2 RNA is generally detectable in upper and lower  respiratory specimens dur ing the acute phase of infection.  Positive  results are indicative of active infection with SARS-CoV-2.  Clinical  correlation with patient history and  other diagnostic information is  necessary to determine patient infection status.  Positive results do  not rule out bacterial infection or co-infection with other viruses. If result is PRESUMPTIVE POSTIVE SARS-CoV-2 nucleic acids MAY BE PRESENT.   A presumptive positive result was obtained on the submitted specimen  and confirmed on repeat testing.  While 2019 novel coronavirus  (SARS-CoV-2) nucleic acids may be present in the submitted sample  additional confirmatory testing may be necessary for epidemiological  and / or clinical management purposes  to differentiate between  SARS-CoV-2 and other Sarbecovirus currently known to infect humans.  If clinically indicated additional testing with an alternate test  methodology [phone]) is advised. The SARS-CoV-2 RNA is generally  detectable in upper and lower respiratory sp ecimens during the acute  phase of infection. The expected result is Negative. Fact Sheet for Patients:  StrictlyIdeas.no Fact Sheet for Healthcare Providers: BankingDealers.co.za This test is not yet approved or cleared by the [place] FDA and has been authorized for detection and/or diagnosis of SARS-CoV-2 by FDA under an Emergency Use Authorization (EUA).  This EUA will remain in effect (meaning this test can be used) for the duration of the COVID-19 declaration under Section 564(b)(1) of the Act, 21 U.S.C. section 360bbb-3(b)(1), unless the authorization is terminated or revoked sooner. Performed at [facility], [place] [address] [place], [place] [address]     Radiology Reports Dg Tibia/fibula Right  Result Date: 06/23/2019 CLINICAL DATA:  Right lower leg injury and pain after a fall 06/21/2019. EXAM: RIGHT TIBIA AND FIBULA - 2 VIEW COMPARISON:  None. FINDINGS: No acute bony or joint abnormality is identified. The patient has advanced tricompartmental osteoarthritis of the right knee. Soft  tissues are unremarkable. IMPRESSION: No acute abnormality. Advanced osteoarthritis right knee. Electronically Signed   By: Inge Rise M.D.   On: 06/23/2019 09:55   Ct Head Wo Contrast  Result Date: 06/22/2019 CLINICAL DATA:  Fall.  Headache. EXAM: CT HEAD WITHOUT CONTRAST CT CERVICAL SPINE WITHOUT CONTRAST TECHNIQUE: Multidetector CT imaging of the head and cervical spine was performed following the standard protocol without intravenous contrast. Multiplanar CT image reconstructions of the cervical spine were also generated. COMPARISON:  CT head 03/19/2019 FINDINGS: CT HEAD FINDINGS Brain: Generalized atrophy.  Mild white matter changes. Negative for acute infarct, hemorrhage, mass.  No midline shift. Vascular: Negative for hyperdense vessel. Atherosclerotic  calcification. Skull: Negative for skull fracture Sinuses/Orbits: Mild mucosal edema paranasal sinuses. Bilateral cataract surgery. Other: None CT CERVICAL SPINE FINDINGS Alignment: Mild anterolisthesis C4-5. Straightening of the cervical lordosis. Skull base and vertebrae: Negative for fracture Soft tissues and spinal canal: Atherosclerotic carotid bifurcation bilaterally. No soft tissue mass Disc levels: Multilevel cervical spondylosis with disc degeneration and facet degeneration. Disc space narrowing and spurring most prominent C4-5 and C5-6. Moderate spinal stenosis at C5-6. Upper chest: Lung apices clear bilaterally. Apically scarring bilaterally. Other: None IMPRESSION: 1. Atrophy and chronic microvascular ischemia. No acute intracranial abnormality. 2. Cervical spondylosis without acute fracture. Electronically Signed   By: Franchot Gallo M.D.   On: 06/22/2019 15:48   Ct Cervical Spine Wo Contrast  Result Date: 06/22/2019 CLINICAL DATA:  Fall.  Headache. EXAM: CT HEAD WITHOUT CONTRAST CT CERVICAL SPINE WITHOUT CONTRAST TECHNIQUE: Multidetector CT imaging of the head and cervical spine was performed following the standard protocol without  intravenous contrast. Multiplanar CT image reconstructions of the cervical spine were also generated. COMPARISON:  CT head 03/19/2019 FINDINGS: CT HEAD FINDINGS Brain: Generalized atrophy.  Mild white matter changes. Negative for acute infarct, hemorrhage, mass.  No midline shift. Vascular: Negative for hyperdense vessel. Atherosclerotic calcification. Skull: Negative for skull fracture Sinuses/Orbits: Mild mucosal edema paranasal sinuses. Bilateral cataract surgery. Other: None CT CERVICAL SPINE FINDINGS Alignment: Mild anterolisthesis C4-5. Straightening of the cervical lordosis. Skull base and vertebrae: Negative for fracture Soft tissues and spinal canal: Atherosclerotic carotid bifurcation bilaterally. No soft tissue mass Disc levels: Multilevel cervical spondylosis with disc degeneration and facet degeneration. Disc space narrowing and spurring most prominent C4-5 and C5-6. Moderate spinal stenosis at C5-6. Upper chest: Lung apices clear bilaterally. Apically scarring bilaterally. Other: None IMPRESSION: 1. Atrophy and chronic microvascular ischemia. No acute intracranial abnormality. 2. Cervical spondylosis without acute fracture. Electronically Signed   By: Franchot Gallo M.D.   On: 06/22/2019 15:48   Ct Lumbar Spine Wo Contrast  Result Date: 06/22/2019 CLINICAL DATA:  Fall.  Back pain.  History of fracture. EXAM: CT LUMBAR SPINE WITHOUT CONTRAST TECHNIQUE: Multidetector CT imaging of the lumbar spine was performed without intravenous contrast administration. Multiplanar CT image reconstructions were also generated. COMPARISON:  CT lumbar 04/02/2018 FINDINGS: Segmentation: Normal Alignment: Approximately 10 mm retrolisthesis L1 relative to L3. Burst fracture L2 unchanged. Mild anterolisthesis L3-4 L4-5 Vertebrae: Moderate compression fracture T12 is chronic and unchanged Fracture of the inferior endplate anteriorly of L1 is chronic and unchanged Severe burst fracture of L2 is unchanged with cement in the  anterior vertebral body. There is extensive retropulsion of bone into the canal unchanged. No acute fracture.  Chronic fractures of the sacrum bilaterally. Paraspinal and other soft tissues: Negative for paraspinous mass or edema. Disc levels: T11-12: Mild to moderate spinal stenosis due to T12 fracture and spurring T12-L1: Disc and facet degeneration without stenosis L1-2: Severe spinal stenosis due to retropulsion of L2 into the canal. Spinal stenosis unchanged from the prior study. Bilateral facet degeneration contributes to stenosis L2-3: Mild disc and facet degeneration without significant spinal stenosis L3-4: Severe facet degeneration and diffuse disc bulging. Moderate spinal stenosis unchanged L4-5: Severe facet degeneration. Diffuse disc bulging. Moderate spinal stenosis and moderate to severe subarticular stenosis bilaterally without interval change. L5-S1: Severe facet degeneration and mild disc degeneration. Decompressive laminectomy without significant spinal stenosis. IMPRESSION: Negative for acute lumbar fracture Chronic compression fractures T12, L1, L2. Chronic burst fracture L2 with retropulsion of bone in the canal and severe spinal stenosis unchanged from 1  year ago. Multilevel degenerative change and spinal stenosis throughout the lumbar spine as above. Electronically Signed   By: Franchot Gallo M.D.   On: 06/22/2019 15:55   Pelvis Portable  Result Date: 06/22/2019 CLINICAL DATA:  Postop left femur EXAM: PORTABLE PELVIS 1-2 VIEWS COMPARISON:  06/21/2019 FINDINGS: Internal fixation across the left femoral intertrochanteric fracture. Lesser trochanter remains displaced, otherwise anatomic alignment. No visible hardware complicating feature. IMPRESSION: Internal fixation across the left intertrochanteric fracture as above. Electronically Signed   By: Rolm Baptise M.D.   On: 06/22/2019 11:32   Chest Portable 1 View  Result Date: 06/22/2019 CLINICAL DATA:  Preop evaluation for upcoming hip  surgery EXAM: PORTABLE CHEST 1 VIEW COMPARISON:  03/19/2019 FINDINGS: Cardiac shadow is at the upper limits of normal in size. Aortic calcifications are noted. The lungs are well aerated bilaterally. No acute bony abnormality is seen. Healing rib fractures are noted bilaterally. IMPRESSION: No acute abnormality noted. Electronically Signed   By: Inez Catalina M.D.   On: 06/22/2019 08:01   Dg Knee Complete 4 Views Left  Result Date: 06/22/2019 CLINICAL DATA:  Left hip fracture EXAM: LEFT KNEE - COMPLETE 4+ VIEW COMPARISON:  None. FINDINGS: Moderate left knee joint effusion. No acute fracture or traumatic malalignment. Mild tricompartmental degenerative changes with bilateral meniscal chondrocalcinosis. Additional mineralization along the lateral joint line demonstrates a in arcs and whorled chondroid matrix which may reflect an intra-articular body which could be sequela of synovial chondromatosis or prior injury versus other heterotopic ossification. Extensive atherosclerotic calcification. Remaining soft tissues are unremarkable. IMPRESSION: 1. Mild tricompartmental degenerative changes. No acute osseous abnormality. 2. Moderate left knee joint effusion. 3. Nonspecific mineralization along the lateral joint line, synovial chondromatosis versus posttraumatic loose body versus heterotopic calcification Electronically Signed   By: Lovena Le M.D.   On: 06/22/2019 19:59   Dg C-arm 1-60 Min-no Report  Result Date: 06/22/2019 Fluoroscopy was utilized by the requesting physician.  No radiographic interpretation.   Dg Hip Operative Unilat W Or W/o Pelvis Left  Result Date: 06/22/2019 CLINICAL DATA:  Femur fracture EXAM: OPERATIVE left HIP (WITH PELVIS IF PERFORMED) 2 VIEWS TECHNIQUE: Fluoroscopic spot image(s) were submitted for interpretation post-operatively. COMPARISON:  06/21/2019 FINDINGS: Two low resolution intraoperative spot views of the left hip. Intramedullary rod and screw fixation of comminuted  left intertrochanteric fracture with slightly displaced lesser trochanteric fracture fragment. Total fluoroscopy time was 48 seconds IMPRESSION: Intraoperative fluoroscopic assistance provided during surgical fixation of left femur fracture Electronically Signed   By: Donavan Foil M.D.   On: 06/22/2019 15:00   Dg Hip Unilat With Pelvis 2-3 Views Left  Result Date: 06/21/2019 CLINICAL DATA:  85 year old who fell earlier this evening and was found by her family on the floor at home. LEFT hip injury with pain. Initial encounter. EXAM: DG HIP (WITH OR WITHOUT PELVIS) 2-3V LEFT COMPARISON:  04/02/2018. FINDINGS: Comminuted intertrochanteric LEFT femoral neck fracture into multiple parts. Hip joint anatomically aligned with severe SUPERIOR joint space narrowing. Included AP pelvis demonstrates no fractures elsewhere. Sacroiliac joints and symphysis pubis anatomically aligned. Severe joint space narrowing involving the contralateral RIGHT hip. IMPRESSION: 1. Acute traumatic comminuted intertrochanteric LEFT femoral neck fracture. 2. Anatomic alignment the LEFT hip joint with severe SUPERIOR joint space narrowing. 3. Severe osteoarthritis involving the contralateral RIGHT hip. Electronically Signed   By: Evangeline Dakin M.D.   On: 06/21/2019 18:46   Dg Femur Min 2 Views Left  Result Date: 06/23/2019 CLINICAL DATA:  Fall.  Left hip fracture  EXAM: Left femur-two view: COMPARISON:  Hip series performed earlier today FINDINGS: The previously described left femoral intertrochanteric fracture with comminution and varus angulation again noted. No additional bony abnormality within the left femur. The left kidney is not completely visualized. IMPRESSION: Comminuted left proximal femoral intertrochanteric fracture with varus angulation. Electronically Signed   By: Rolm Baptise M.D.   On: 06/22/2019 16:12    Time Spent in minutes 25  Sylva Overley M.D on 06/25/2019 at 12:49 PM  Between 7am to 7pm - Pager -  [phone]  After 7pm go to [email or web address] - password [facility]  [facility] -  Office  [phone]

## 2019-06-26 ENCOUNTER — Telehealth: Payer: Self-pay

## 2019-06-26 DIAGNOSIS — R197 Diarrhea, unspecified: Secondary | ICD-10-CM

## 2019-06-26 LAB — TYPE AND SCREEN
ABO/RH(D): O POS
Antibody Screen: NEGATIVE
Unit division: 0

## 2019-06-26 LAB — CBC
HCT: 28.8 % — ABNORMAL LOW (ref 36.0–46.0)
Hemoglobin: 9.5 g/dL — ABNORMAL LOW (ref 12.0–15.0)
MCH: 32.2 pg (ref 26.0–34.0)
MCHC: 33 g/dL (ref 30.0–36.0)
MCV: 97.6 fL (ref 80.0–100.0)
Platelets: UNDETERMINED 10*3/uL (ref 150–400)
RBC: 2.95 MIL/uL — ABNORMAL LOW (ref 3.87–5.11)
RDW: 14.3 % (ref 11.5–15.5)
WBC: 10.9 10*3/uL — ABNORMAL HIGH (ref 4.0–10.5)
nRBC: 0 % (ref 0.0–0.2)

## 2019-06-26 LAB — BPAM RBC
Blood Product Expiration Date: 202009152359
ISSUE DATE / TIME: 202008220942
Unit Type and Rh: 5100

## 2019-06-26 MED ORDER — HALOPERIDOL LACTATE 5 MG/ML IJ SOLN: 2.0000 mg | Freq: Once | INTRAMUSCULAR | Status: DC

## 2019-06-26 MED ORDER — ALPRAZOLAM 0.25 MG PO TABS
0.2500 mg | ORAL_TABLET | Freq: Two times a day (BID) | ORAL | Status: DC | PRN
  Administered 2019-06-26: 0.5 mg via ORAL
  Filled 2019-06-26: qty 2

## 2019-06-26 NOTE — TOC Progression Note (Signed)
Transition of Care [facility]) - Progression Note    Patient Details  Name: Michelle Gregory MRN: [id number] Date of Birth: 1922-11-17  [facility]) CM/SW Contact  Purcell Mouton, RN Phone Number: 06/26/2019, 11:09 AM  Clinical Narrative:    Pt's daughter [name] at bedside is asking for CIR for pt. Explained to [name] that pt will need to qualify for CIR that it is intensive PT. Will ask MD for consult.        Expected Discharge Plan and Services                                                 Social Determinants of Health (SDOH) Interventions    Readmission Risk Interventions No flowsheet data found.

## 2019-06-26 NOTE — Progress Notes (Signed)
Physical Therapy Treatment Patient Details Name: Michelle Gregory MRN: [id number] DOB: 1923-05-17 Today's Date: 06/26/2019    History of Present Illness 85 year old female with a history of anxiety, depression, C. difficile, GERD, colon cancer, recurrent UTIs, hypertension, dementia, osteoarthritis, spinal compression fractures brought to the ED after sustaining a fall at home with the x-ray showing left intertrochanteric femur fracture.  Pt s/p left femur intramedullary fixation on 06/22/19    PT Comments    Pt assisted to sitting and had BM on bed pad.  Pt assisted with transfer to [facility] and then recliner.  Pt fatigued after using [facility].  Daughter requesting CIR upon d/c.  Pt will need SNF if declined by CIR.  Follow Up Recommendations  SNF;CIR     Equipment Recommendations  Rolling walker with 5" wheels    Recommendations for Other Services       Precautions / Restrictions Precautions Precautions: Fall Restrictions Other Position/Activity Restrictions: WBAT LLE    Mobility  Bed Mobility Overal bed mobility: Needs Assistance Bed Mobility: Supine to Sit     Supine to sit: Mod assist     General bed mobility comments: cues for self assist, pt able to move R LE however assist provided L LE due to pain, pt assisted with trunk upright however required assist to scoot to EOB so utilized bed pad, pt continues to lean to right side to avoid sitting on left hip  Transfers Overall transfer level: Needs assistance Equipment used: Rolling walker (2 wheeled) Transfers: Risk manager;Sit to/from Stand Sit to Stand: Max assist;From elevated surface;+2 physical assistance Stand pivot transfers: Max assist;+2 physical assistance       General transfer comment: verbal cues for hand placement, pt with posterior lean upon standing requiring assist and cues to correct; also increased cues to take smalls steps for pivoting transfers; pt assisted to [facility] and then to  recliner  Ambulation/Gait             General Gait Details: deferred due to assist level and pt fatigue after using [facility]   Stairs             Wheelchair Mobility    Modified Rankin (Stroke Patients Only)       Balance Overall balance assessment: Needs assistance;History of Falls         Standing balance support: Bilateral upper extremity supported Standing balance-Leahy Scale: Zero Standing balance comment: requiring UE support and max physical external support                            Cognition Arousal/Alertness: Awake/alert Behavior During Therapy: WFL for tasks assessed/performed                                   General Comments: appropriately conversational (hx of dementia per chart); pleasant and follows commands      Exercises      General Comments        Pertinent Vitals/Pain Pain Assessment: Faces Faces Pain Scale: Hurts little more Pain Location: LLE with bed mobility Pain Descriptors / Indicators: Guarding;Grimacing Pain Intervention(s): Repositioned;Premedicated before session;Monitored during session    Home Living                      Prior Function            PT Goals (current goals can now be  found in the care plan section) Progress towards PT goals: Progressing toward goals    Frequency    Min 3X/week      PT Plan Current plan remains appropriate    Co-evaluation              AM-PAC PT "6 Clicks" Mobility   Outcome Measure  Help needed turning from your back to your side while in a flat bed without using bedrails?: A Lot Help needed moving from lying on your back to sitting on the side of a flat bed without using bedrails?: A Lot Help needed moving to and from a bed to a chair (including a wheelchair)?: A Lot Help needed standing up from a chair using your arms (e.g., wheelchair or bedside chair)?: Total Help needed to walk in hospital room?: Total Help needed climbing  3-5 steps with a railing? : Total 6 Click Score: 9    End of Session Equipment Utilized During Treatment: Gait belt Activity Tolerance: Patient limited by pain Patient left: with call bell/phone within reach;in chair;with chair alarm set;with family/visitor present   PT Visit Diagnosis: Difficulty in walking, not elsewhere classified (R26.2)     Time: [id number] PT Time Calculation (min) (ACUTE ONLY): 27 min  Charges:  $Therapeutic Activity: 23-37 mins                     Carmelia Bake, PT, DPT Acute Rehabilitation Services Office: [phone] Pager: [place] E 06/26/2019, 12:59 PM

## 2019-06-26 NOTE — Progress Notes (Signed)
Inpatient Rehabilitation Admissions Coordinator   Inpatient rehab consult received. I reviewed chart and spoke with pt's daughter by phone for rehab assessment. Pt was independent with adls without assistive device pta. Daughter can provide 24/7 assist at d/c and prefers an inpt rehab admit rather than SNF for her brother in law received his rehab at [place] in th past. I will discuss with Dr. Naaman [name] and follow up tomorrow. I contacted physical therapist, Valetta Fuller to discuss her progress.  Danne Baxter, RN, MSN Rehab Admissions Coordinator [phone] 06/26/2019 4:25 PM

## 2019-06-26 NOTE — Progress Notes (Signed)
Pt pulled out IV and some surgical dressing of her hip. Climbing over bed rails, and stripping naked several times during shift. Very agitated and restless. Uncooperative and does not follow commands. extremely confused. MD aware. Potential to harm self.

## 2019-06-26 NOTE — Telephone Encounter (Signed)
Michelle Gregory (DPR signed) called pt is on 4th floor at [place] said that nurse needs last BP reading in office; pts BP readings vary so Michelle Gregory gave phone to [place] at [place] and I helped her find review flowsheet in pts chart and Hinton Dyer found all the BP readings listed. Nothing further needed.

## 2019-06-26 NOTE — Care Management Important Message (Signed)
Important Message  Patient Details  Name: Michelle Gregory MRN: [id number] Date of Birth: Aug 15, 1923   Medicare Important Message Given:  Yes. CMA printed out IM for the Case Management Nurse or CSW to give to patient.     Auda Finfrock 06/26/2019, 8:50 AM

## 2019-06-26 NOTE — Progress Notes (Signed)
PROGRESS NOTE                                                                                                                                                                                                             Patient Demographics:    Michelle Gregory, is a 85 y.o. female, DOB - 1923-06-09, [id number]  Admit date - 06/21/2019   Admitting Physician Reubin Milan, MD  Outpatient Primary MD for the patient is Tower, Michelle Fanny, MD  LOS - 5   Chief Complaint  Patient presents with  . Fall       Brief Narrative   85 year old female with a history of anxiety, depression, C. difficile, GERD, colon cancer, recurrent UTIs, hypertension, osteoarthritis brought to the ED after sustaining a fall at home with the x-ray showing left intertrochanteric femur fracture.  Patient seen by orthopedics and taken to the OR for intramedullary fixation of the left femur.   Subjective:   Was confused overnight.  Reported several episodes of diarrhea.   Assessment  & Plan :    Principal Problem:   Closed left hip fracture, initial encounter [facility]) Status post left femur intramedullary fixation.  Tolerated surgery well. Pain control with PRN Norco. Continue bowel regimen.  DVT prophylaxis with subcu Lovenox. PT recommends SNF versus CIR.  Consulted.   Active Problems: Acute blood loss anemia/thrombocytopenia Postsurgical blood loss with drop in hemoglobin to 6.9.  (Baseline of 12).  Improved to >9 with 1 unit PRBC. Platelets level improved as well.  Hypotension and oliguria Possibly due to hypovolemia.  Blood pressure meds were held and given IV fluids with improvement.  Now resumed.  Diarrhea Likely secondary to antibiotic use.  Antibiotic now discontinued.  Will monitor for now  Leukocytosis   Reactive + associated with UTI.  E. coli urinary tract infection Pansensitive.  Completed 5 days of Rocephin.   Hypokalemia/hypomagnesemia Replenished   GERD Continue PPI  Gout Continue allopurinol.  Stable  Delirium Likely sundowning and associated with infection/pain.  Monitor.   Code Status : Full  Family Communication  : Spoke with daughter at bedside   Disposition Plan  : SNF versus CIR possibly on 8/25.  Barriers For Discharge : Active symptoms  Consults  : Orthopedics (Dr. Lyla Glassing)  Procedures  : IM nailing of left femur fracture  DVT Prophylaxis  :  Lovenox -   Lab Results  Component Value Date   PLT PLATELET CLUMPS NOTED ON SMEAR, UNABLE TO ESTIMATE 06/26/2019    Antibiotics  :    Anti-infectives (From admission, onward)   Start     Dose/Rate Route Frequency Ordered Stop   06/23/19 1100  cefTRIAXone (ROCEPHIN) 1 g in sodium chloride 0.9 % 100 mL IVPB  Status:  Discontinued     1 g 200 mL/hr over 30 Minutes Intravenous Every 24 hours 06/23/19 1048 06/23/19 1049   06/23/19 0100  cefTRIAXone (ROCEPHIN) 1 g in sodium chloride 0.9 % 100 mL IVPB  Status:  Discontinued     1 g 200 mL/hr over 30 Minutes Intravenous Every 24 hours 06/22/19 0727 06/26/19 0927   06/22/19 1430  ceFAZolin (ANCEF) IVPB 2g/100 mL premix     2 g 200 mL/hr over 30 Minutes Intravenous Every 6 hours 06/22/19 1130 06/22/19 2209   06/22/19 0715  ceFAZolin (ANCEF) IVPB 2g/100 mL premix     2 g 200 mL/hr over 30 Minutes Intravenous On call to O.R. 06/22/19 0710 06/22/19 0834   06/22/19 0030  cefTRIAXone (ROCEPHIN) 1 g in sodium chloride 0.9 % 100 mL IVPB     1 g 200 mL/hr over 30 Minutes Intravenous  Once 06/22/19 0017 06/22/19 0131        Objective:   Vitals:   06/25/19 1431 06/26/19 0555 06/26/19 1215 06/26/19 1325  BP: (!) 141/89 (!) 142/84 94/62 106/63  Pulse: 84 95 70   Resp: 16 18 20    Temp: 98.4 F (36.9 C) 98.3 F (36.8 C) 97.8 F (36.6 C)   TempSrc: Oral Oral Oral   SpO2: 95% 96% 94%   Weight:      Height:        Wt Readings from Last 3 Encounters:  06/22/19 54.4 kg   01/23/19 51 kg  09/01/18 53 kg     Intake/Output Summary (Last 24 hours) at 06/26/2019 1452 Last data filed at 06/26/2019 0600 Gross per 24 hour  Intake 220 ml  Output 500 ml  Net -280 ml   Physical exam Confused, not in distress HEENT: Moist mucosa, supple neck Chest: Clear CVs: Normal S1-S2 GI: Soft, nontender, nondistended Musculoskeletal: Clean dressing over left hip      Data Review:    CBC Recent Labs  Lab 06/21/19 1911  06/23/19 0429 06/24/19 0425 06/24/19 1655 06/25/19 0550 06/26/19 0504  WBC 13.7*   < > 16.0* 9.5 9.3 10.0 10.9*  HGB 12.1   < > 8.0* 6.9* 9.1* 9.4* 9.5*  HCT 36.4   < > 24.2* 21.6* 28.2* 28.6* 28.8*  PLT 142*   < > 110* 80* 78* 99* PLATELET CLUMPS NOTED ON SMEAR, UNABLE TO ESTIMATE  MCV 99.7   < > 100.0 104.3* 99.6 97.9 97.6  MCH 33.2   < > 33.1 33.3 32.2 32.2 32.2  MCHC 33.2   < > 33.1 31.9 32.3 32.9 33.0  RDW 12.7   < > 13.1 13.2 15.0 15.1 14.3  LYMPHSABS 0.9  --   --   --   --   --   --   MONOABS 0.9  --   --   --   --   --   --   EOSABS 0.0  --   --   --   --   --   --   BASOSABS 0.0  --   --   --   --   --   --    < > =  values in this interval not displayed.    Chemistries  Recent Labs  Lab 06/21/19 1911 06/22/19 1149 06/23/19 0429 06/24/19 0425  NA 138  --  140 140  K 3.9  --  3.4* 3.7  CL 104  --  103 106  CO2 22  --  21* 23  GLUCOSE 145*  --  111* 105*  BUN 22  --  32* 32*  CREATININE 0.74 1.03* 1.10* 0.86  CALCIUM 8.6*  --  8.4* 8.0*  MG 1.5*  --   --   --   AST 24  --   --   --   ALT 14  --   --   --   ALKPHOS 44  --   --   --   BILITOT 1.0  --   --   --    ------------------------------------------------------------------------------------------------------------------ No results for input(s): CHOL, HDL, LDLCALC, TRIG, CHOLHDL, LDLDIRECT in the last 72 hours.  Lab Results  Component Value Date   HGBA1C 5.6 02/01/2019    ------------------------------------------------------------------------------------------------------------------ No results for input(s): TSH, T4TOTAL, T3FREE, THYROIDAB in the last 72 hours.  Invalid input(s): FREET3 ------------------------------------------------------------------------------------------------------------------ No results for input(s): VITAMINB12, FOLATE, FERRITIN, TIBC, IRON, RETICCTPCT in the last 72 hours.  Coagulation profile Recent Labs  Lab 06/22/19 0247 06/22/19 1148  INR 1.1 1.1    No results for input(s): DDIMER in the last 72 hours.  Cardiac Enzymes No results for input(s): CKMB, TROPONINI, MYOGLOBIN in the last 168 hours.  Invalid input(s): CK ------------------------------------------------------------------------------------------------------------------ No results found for: BNP  Inpatient Medications  Scheduled Meds: . allopurinol  100 mg Oral BID  . aspirin EC  81 mg Oral Daily  . docusate sodium  100 mg Oral BID  . enoxaparin (LOVENOX) injection  30 mg Subcutaneous Q24H  . gabapentin  300 mg Oral BID  . haloperidol lactate  2 mg Intravenous Once  . losartan  100 mg Oral Daily  . metoprolol tartrate  50 mg Oral BID  . senna  1 tablet Oral BID   Continuous Infusions:  PRN Meds:.HYDROcodone-acetaminophen, HYDROmorphone (DILAUDID) injection, meclizine, menthol-cetylpyridinium **OR** phenol, ondansetron **OR** ondansetron (ZOFRAN) IV  Micro Results Recent Results (from the past 240 hour(s))  Urine culture     Status: Abnormal   Collection Time: 06/21/19  6:04 PM   Specimen: Urine, Clean Catch  Result Value Ref Range Status   Specimen Description   Final    URINE, CLEAN CATCH Performed at [facility], [place] [address] [place], [place] [address]    Special Requests   Final    NONE Performed at [facility], [place] [address] [place], [place] [address]    Culture >=100,000 COLONIES/mL  ESCHERICHIA COLI (A)  Final   Report Status 06/24/2019 FINAL  Final   Organism ID, Bacteria ESCHERICHIA COLI (A)  Final      Susceptibility   Escherichia coli - MIC*    AMPICILLIN 4 SENSITIVE Sensitive     CEFAZOLIN <=4 SENSITIVE Sensitive     CEFTRIAXONE <=1 SENSITIVE Sensitive     CIPROFLOXACIN <=0.25 SENSITIVE Sensitive     GENTAMICIN <=1 SENSITIVE Sensitive     IMIPENEM <=0.25 SENSITIVE Sensitive     NITROFURANTOIN 32 SENSITIVE Sensitive     TRIMETH/SULFA <=20 SENSITIVE Sensitive     AMPICILLIN/SULBACTAM <=2 SENSITIVE Sensitive     PIP/TAZO <=4 SENSITIVE Sensitive     Extended ESBL NEGATIVE Sensitive     * >=100,000 COLONIES/mL ESCHERICHIA COLI  SARS Coronavirus 2 [facility]  order, Performed in [facility] hospital lab) Nasopharyngeal Nasopharyngeal Swab     Status: None   Collection Time: 06/21/19  6:05 PM   Specimen: Nasopharyngeal Swab  Result Value Ref Range Status   SARS Coronavirus 2 NEGATIVE NEGATIVE Final    Comment: (NOTE) If result is NEGATIVE SARS-CoV-2 target nucleic acids are NOT DETECTED. The SARS-CoV-2 RNA is generally detectable in upper and lower  respiratory specimens during the acute phase of infection. The lowest  concentration of SARS-CoV-2 viral copies this assay can detect is 250  copies / mL. A negative result does not preclude SARS-CoV-2 infection  and should not be used as the sole basis for treatment or other  patient management decisions.  A negative result may occur with  improper specimen collection / handling, submission of specimen other  than nasopharyngeal swab, presence of viral mutation(s) within the  areas targeted by this assay, and inadequate number of viral copies  (<250 copies / mL). A negative result must be combined with clinical  observations, patient history, and epidemiological information. If result is POSITIVE SARS-CoV-2 target nucleic acids are DETECTED. The SARS-CoV-2 RNA is generally detectable in upper and lower   respiratory specimens dur ing the acute phase of infection.  Positive  results are indicative of active infection with SARS-CoV-2.  Clinical  correlation with patient history and other diagnostic information is  necessary to determine patient infection status.  Positive results do  not rule out bacterial infection or co-infection with other viruses. If result is PRESUMPTIVE POSTIVE SARS-CoV-2 nucleic acids MAY BE PRESENT.   A presumptive positive result was obtained on the submitted specimen  and confirmed on repeat testing.  While 2019 novel coronavirus  (SARS-CoV-2) nucleic acids may be present in the submitted sample  additional confirmatory testing may be necessary for epidemiological  and / or clinical management purposes  to differentiate between  SARS-CoV-2 and other Sarbecovirus currently known to infect humans.  If clinically indicated additional testing with an alternate test  methodology [phone]) is advised. The SARS-CoV-2 RNA is generally  detectable in upper and lower respiratory sp ecimens during the acute  phase of infection. The expected result is Negative. Fact Sheet for Patients:  StrictlyIdeas.no Fact Sheet for Healthcare Providers: BankingDealers.co.za This test is not yet approved or cleared by the [place] FDA and has been authorized for detection and/or diagnosis of SARS-CoV-2 by FDA under an Emergency Use Authorization (EUA).  This EUA will remain in effect (meaning this test can be used) for the duration of the COVID-19 declaration under Section 564(b)(1) of the Act, 21 U.S.C. section 360bbb-3(b)(1), unless the authorization is terminated or revoked sooner. Performed at [facility], [place] [address], [place], [place] [address]     Radiology Reports Dg Tibia/fibula Right  Result Date: 06/23/2019 CLINICAL DATA:  Right lower leg injury and pain after a fall 06/21/2019. EXAM: RIGHT TIBIA  AND FIBULA - 2 VIEW COMPARISON:  None. FINDINGS: No acute bony or joint abnormality is identified. The patient has advanced tricompartmental osteoarthritis of the right knee. Soft tissues are unremarkable. IMPRESSION: No acute abnormality. Advanced osteoarthritis right knee. Electronically Signed   By: Inge Rise M.D.   On: 06/23/2019 09:55   Ct Head Wo Contrast  Result Date: 06/22/2019 CLINICAL DATA:  Fall.  Headache. EXAM: CT HEAD WITHOUT CONTRAST CT CERVICAL SPINE WITHOUT CONTRAST TECHNIQUE: Multidetector CT imaging of the head and cervical spine was performed following the standard protocol without intravenous contrast. Multiplanar CT image reconstructions of  the cervical spine were also generated. COMPARISON:  CT head 03/19/2019 FINDINGS: CT HEAD FINDINGS Brain: Generalized atrophy.  Mild white matter changes. Negative for acute infarct, hemorrhage, mass.  No midline shift. Vascular: Negative for hyperdense vessel. Atherosclerotic calcification. Skull: Negative for skull fracture Sinuses/Orbits: Mild mucosal edema paranasal sinuses. Bilateral cataract surgery. Other: None CT CERVICAL SPINE FINDINGS Alignment: Mild anterolisthesis C4-5. Straightening of the cervical lordosis. Skull base and vertebrae: Negative for fracture Soft tissues and spinal canal: Atherosclerotic carotid bifurcation bilaterally. No soft tissue mass Disc levels: Multilevel cervical spondylosis with disc degeneration and facet degeneration. Disc space narrowing and spurring most prominent C4-5 and C5-6. Moderate spinal stenosis at C5-6. Upper chest: Lung apices clear bilaterally. Apically scarring bilaterally. Other: None IMPRESSION: 1. Atrophy and chronic microvascular ischemia. No acute intracranial abnormality. 2. Cervical spondylosis without acute fracture. Electronically Signed   By: Franchot Gallo M.D.   On: 06/22/2019 15:48   Ct Cervical Spine Wo Contrast  Result Date: 06/22/2019 CLINICAL DATA:  Fall.  Headache. EXAM: CT  HEAD WITHOUT CONTRAST CT CERVICAL SPINE WITHOUT CONTRAST TECHNIQUE: Multidetector CT imaging of the head and cervical spine was performed following the standard protocol without intravenous contrast. Multiplanar CT image reconstructions of the cervical spine were also generated. COMPARISON:  CT head 03/19/2019 FINDINGS: CT HEAD FINDINGS Brain: Generalized atrophy.  Mild white matter changes. Negative for acute infarct, hemorrhage, mass.  No midline shift. Vascular: Negative for hyperdense vessel. Atherosclerotic calcification. Skull: Negative for skull fracture Sinuses/Orbits: Mild mucosal edema paranasal sinuses. Bilateral cataract surgery. Other: None CT CERVICAL SPINE FINDINGS Alignment: Mild anterolisthesis C4-5. Straightening of the cervical lordosis. Skull base and vertebrae: Negative for fracture Soft tissues and spinal canal: Atherosclerotic carotid bifurcation bilaterally. No soft tissue mass Disc levels: Multilevel cervical spondylosis with disc degeneration and facet degeneration. Disc space narrowing and spurring most prominent C4-5 and C5-6. Moderate spinal stenosis at C5-6. Upper chest: Lung apices clear bilaterally. Apically scarring bilaterally. Other: None IMPRESSION: 1. Atrophy and chronic microvascular ischemia. No acute intracranial abnormality. 2. Cervical spondylosis without acute fracture. Electronically Signed   By: Franchot Gallo M.D.   On: 06/22/2019 15:48   Ct Lumbar Spine Wo Contrast  Result Date: 06/22/2019 CLINICAL DATA:  Fall.  Back pain.  History of fracture. EXAM: CT LUMBAR SPINE WITHOUT CONTRAST TECHNIQUE: Multidetector CT imaging of the lumbar spine was performed without intravenous contrast administration. Multiplanar CT image reconstructions were also generated. COMPARISON:  CT lumbar 04/02/2018 FINDINGS: Segmentation: Normal Alignment: Approximately 10 mm retrolisthesis L1 relative to L3. Burst fracture L2 unchanged. Mild anterolisthesis L3-4 L4-5 Vertebrae: Moderate  compression fracture T12 is chronic and unchanged Fracture of the inferior endplate anteriorly of L1 is chronic and unchanged Severe burst fracture of L2 is unchanged with cement in the anterior vertebral body. There is extensive retropulsion of bone into the canal unchanged. No acute fracture.  Chronic fractures of the sacrum bilaterally. Paraspinal and other soft tissues: Negative for paraspinous mass or edema. Disc levels: T11-12: Mild to moderate spinal stenosis due to T12 fracture and spurring T12-L1: Disc and facet degeneration without stenosis L1-2: Severe spinal stenosis due to retropulsion of L2 into the canal. Spinal stenosis unchanged from the prior study. Bilateral facet degeneration contributes to stenosis L2-3: Mild disc and facet degeneration without significant spinal stenosis L3-4: Severe facet degeneration and diffuse disc bulging. Moderate spinal stenosis unchanged L4-5: Severe facet degeneration. Diffuse disc bulging. Moderate spinal stenosis and moderate to severe subarticular stenosis bilaterally without interval change. L5-S1: Severe facet degeneration and  mild disc degeneration. Decompressive laminectomy without significant spinal stenosis. IMPRESSION: Negative for acute lumbar fracture Chronic compression fractures T12, L1, L2. Chronic burst fracture L2 with retropulsion of bone in the canal and severe spinal stenosis unchanged from 1 year ago. Multilevel degenerative change and spinal stenosis throughout the lumbar spine as above. Electronically Signed   By: Franchot Gallo M.D.   On: 06/22/2019 15:55   Pelvis Portable  Result Date: 06/22/2019 CLINICAL DATA:  Postop left femur EXAM: PORTABLE PELVIS 1-2 VIEWS COMPARISON:  06/21/2019 FINDINGS: Internal fixation across the left femoral intertrochanteric fracture. Lesser trochanter remains displaced, otherwise anatomic alignment. No visible hardware complicating feature. IMPRESSION: Internal fixation across the left intertrochanteric  fracture as above. Electronically Signed   By: Rolm Baptise M.D.   On: 06/22/2019 11:32   Chest Portable 1 View  Result Date: 06/22/2019 CLINICAL DATA:  Preop evaluation for upcoming hip surgery EXAM: PORTABLE CHEST 1 VIEW COMPARISON:  03/19/2019 FINDINGS: Cardiac shadow is at the upper limits of normal in size. Aortic calcifications are noted. The lungs are well aerated bilaterally. No acute bony abnormality is seen. Healing rib fractures are noted bilaterally. IMPRESSION: No acute abnormality noted. Electronically Signed   By: Inez Catalina M.D.   On: 06/22/2019 08:01   Dg Knee Complete 4 Views Left  Result Date: 06/22/2019 CLINICAL DATA:  Left hip fracture EXAM: LEFT KNEE - COMPLETE 4+ VIEW COMPARISON:  None. FINDINGS: Moderate left knee joint effusion. No acute fracture or traumatic malalignment. Mild tricompartmental degenerative changes with bilateral meniscal chondrocalcinosis. Additional mineralization along the lateral joint line demonstrates a in arcs and whorled chondroid matrix which may reflect an intra-articular body which could be sequela of synovial chondromatosis or prior injury versus other heterotopic ossification. Extensive atherosclerotic calcification. Remaining soft tissues are unremarkable. IMPRESSION: 1. Mild tricompartmental degenerative changes. No acute osseous abnormality. 2. Moderate left knee joint effusion. 3. Nonspecific mineralization along the lateral joint line, synovial chondromatosis versus posttraumatic loose body versus heterotopic calcification Electronically Signed   By: Lovena Le M.D.   On: 06/22/2019 19:59   Dg C-arm 1-60 Min-no Report  Result Date: 06/22/2019 Fluoroscopy was utilized by the requesting physician.  No radiographic interpretation.   Dg Hip Operative Unilat W Or W/o Pelvis Left  Result Date: 06/22/2019 CLINICAL DATA:  Femur fracture EXAM: OPERATIVE left HIP (WITH PELVIS IF PERFORMED) 2 VIEWS TECHNIQUE: Fluoroscopic spot image(s) were  submitted for interpretation post-operatively. COMPARISON:  06/21/2019 FINDINGS: Two low resolution intraoperative spot views of the left hip. Intramedullary rod and screw fixation of comminuted left intertrochanteric fracture with slightly displaced lesser trochanteric fracture fragment. Total fluoroscopy time was 48 seconds IMPRESSION: Intraoperative fluoroscopic assistance provided during surgical fixation of left femur fracture Electronically Signed   By: Donavan Foil M.D.   On: 06/22/2019 15:00   Dg Hip Unilat With Pelvis 2-3 Views Left  Result Date: 06/21/2019 CLINICAL DATA:  85 year old who fell earlier this evening and was found by her family on the floor at home. LEFT hip injury with pain. Initial encounter. EXAM: DG HIP (WITH OR WITHOUT PELVIS) 2-3V LEFT COMPARISON:  04/02/2018. FINDINGS: Comminuted intertrochanteric LEFT femoral neck fracture into multiple parts. Hip joint anatomically aligned with severe SUPERIOR joint space narrowing. Included AP pelvis demonstrates no fractures elsewhere. Sacroiliac joints and symphysis pubis anatomically aligned. Severe joint space narrowing involving the contralateral RIGHT hip. IMPRESSION: 1. Acute traumatic comminuted intertrochanteric LEFT femoral neck fracture. 2. Anatomic alignment the LEFT hip joint with severe SUPERIOR joint space narrowing. 3. Severe osteoarthritis  involving the contralateral RIGHT hip. Electronically Signed   By: Evangeline Dakin M.D.   On: 06/21/2019 18:46   Dg Femur Min 2 Views Left  Result Date: 06/23/2019 CLINICAL DATA:  Fall.  Left hip fracture EXAM: Left femur-two view: COMPARISON:  Hip series performed earlier today FINDINGS: The previously described left femoral intertrochanteric fracture with comminution and varus angulation again noted. No additional bony abnormality within the left femur. The left kidney is not completely visualized. IMPRESSION: Comminuted left proximal femoral intertrochanteric fracture with varus  angulation. Electronically Signed   By: Rolm Baptise M.D.   On: 06/22/2019 16:12    Time Spent in minutes 25  Emerson Schreifels M.D on 06/26/2019 at 2:52 PM  Between 7am to 7pm - Pager - [phone]  After 7pm go to [email or web address] - password [facility]  [facility] -  Office  [phone]

## 2019-06-27 ENCOUNTER — Inpatient Hospital Stay (HOSPITAL_COMMUNITY)
Admission: RE | Admit: 2019-06-27 | Discharge: 2019-07-08 | DRG: 560 | Disposition: A | Discharge status: Home Health | Payer: MEDICARE | Source: Other Acute Inpatient Hospital | Attending: Physical Medicine and Rehabilitation | Admitting: Physical Medicine and Rehabilitation

## 2019-06-27 ENCOUNTER — Other Ambulatory Visit: Payer: Self-pay

## 2019-06-27 ENCOUNTER — Encounter (HOSPITAL_COMMUNITY): Payer: Self-pay | Admitting: *Deleted

## 2019-06-27 DIAGNOSIS — F05 Delirium due to known physiological condition: Secondary | ICD-10-CM

## 2019-06-27 DIAGNOSIS — R32 Unspecified urinary incontinence: Secondary | ICD-10-CM | POA: Diagnosis present

## 2019-06-27 DIAGNOSIS — Z881 Allergy status to other antibiotic agents status: Secondary | ICD-10-CM

## 2019-06-27 DIAGNOSIS — R159 Full incontinence of feces: Secondary | ICD-10-CM | POA: Diagnosis present

## 2019-06-27 DIAGNOSIS — F0391 Unspecified dementia with behavioral disturbance: Secondary | ICD-10-CM | POA: Diagnosis present

## 2019-06-27 DIAGNOSIS — N390 Urinary tract infection, site not specified: Secondary | ICD-10-CM | POA: Diagnosis present

## 2019-06-27 DIAGNOSIS — D649 Anemia, unspecified: Secondary | ICD-10-CM

## 2019-06-27 DIAGNOSIS — G3109 Other frontotemporal dementia: Secondary | ICD-10-CM | POA: Diagnosis not present

## 2019-06-27 DIAGNOSIS — S72142S Displaced intertrochanteric fracture of left femur, sequela: Secondary | ICD-10-CM | POA: Diagnosis not present

## 2019-06-27 DIAGNOSIS — D62 Acute posthemorrhagic anemia: Secondary | ICD-10-CM | POA: Diagnosis present

## 2019-06-27 DIAGNOSIS — I10 Essential (primary) hypertension: Secondary | ICD-10-CM

## 2019-06-27 DIAGNOSIS — S72002A Fracture of unspecified part of neck of left femur, initial encounter for closed fracture: Secondary | ICD-10-CM

## 2019-06-27 DIAGNOSIS — Z7982 Long term (current) use of aspirin: Secondary | ICD-10-CM

## 2019-06-27 DIAGNOSIS — Z87440 Personal history of urinary (tract) infections: Secondary | ICD-10-CM

## 2019-06-27 DIAGNOSIS — N3000 Acute cystitis without hematuria: Secondary | ICD-10-CM

## 2019-06-27 DIAGNOSIS — S72142D Displaced intertrochanteric fracture of left femur, subsequent encounter for closed fracture with routine healing: Principal | ICD-10-CM

## 2019-06-27 DIAGNOSIS — F0390 Unspecified dementia without behavioral disturbance: Secondary | ICD-10-CM

## 2019-06-27 DIAGNOSIS — Z8781 Personal history of (healed) traumatic fracture: Secondary | ICD-10-CM | POA: Diagnosis present

## 2019-06-27 DIAGNOSIS — D696 Thrombocytopenia, unspecified: Secondary | ICD-10-CM | POA: Diagnosis present

## 2019-06-27 DIAGNOSIS — G8929 Other chronic pain: Secondary | ICD-10-CM | POA: Diagnosis present

## 2019-06-27 DIAGNOSIS — B9620 Unspecified Escherichia coli [E. coli] as the cause of diseases classified elsewhere: Secondary | ICD-10-CM | POA: Diagnosis present

## 2019-06-27 DIAGNOSIS — Z85038 Personal history of other malignant neoplasm of large intestine: Secondary | ICD-10-CM | POA: Diagnosis not present

## 2019-06-27 DIAGNOSIS — E785 Hyperlipidemia, unspecified: Secondary | ICD-10-CM | POA: Diagnosis present

## 2019-06-27 DIAGNOSIS — Z79899 Other long term (current) drug therapy: Secondary | ICD-10-CM

## 2019-06-27 DIAGNOSIS — S72142G Displaced intertrochanteric fracture of left femur, subsequent encounter for closed fracture with delayed healing: Secondary | ICD-10-CM | POA: Diagnosis not present

## 2019-06-27 DIAGNOSIS — R000 Tachycardia, unspecified: Secondary | ICD-10-CM | POA: Diagnosis not present

## 2019-06-27 DIAGNOSIS — R410 Disorientation, unspecified: Secondary | ICD-10-CM

## 2019-06-27 DIAGNOSIS — K219 Gastro-esophageal reflux disease without esophagitis: Secondary | ICD-10-CM | POA: Diagnosis present

## 2019-06-27 DIAGNOSIS — E860 Dehydration: Secondary | ICD-10-CM | POA: Diagnosis present

## 2019-06-27 DIAGNOSIS — E876 Hypokalemia: Secondary | ICD-10-CM

## 2019-06-27 DIAGNOSIS — F419 Anxiety disorder, unspecified: Secondary | ICD-10-CM | POA: Diagnosis present

## 2019-06-27 DIAGNOSIS — F0281 Dementia in other diseases classified elsewhere with behavioral disturbance: Secondary | ICD-10-CM | POA: Diagnosis not present

## 2019-06-27 DIAGNOSIS — S329XXA Fracture of unspecified parts of lumbosacral spine and pelvis, initial encounter for closed fracture: Secondary | ICD-10-CM | POA: Diagnosis present

## 2019-06-27 DIAGNOSIS — W1830XD Fall on same level, unspecified, subsequent encounter: Secondary | ICD-10-CM | POA: Diagnosis not present

## 2019-06-27 DIAGNOSIS — F329 Major depressive disorder, single episode, unspecified: Secondary | ICD-10-CM | POA: Diagnosis present

## 2019-06-27 DIAGNOSIS — M25552 Pain in left hip: Secondary | ICD-10-CM | POA: Diagnosis not present

## 2019-06-27 DIAGNOSIS — F0392 Unspecified dementia, unspecified severity, with psychotic disturbance: Secondary | ICD-10-CM | POA: Diagnosis not present

## 2019-06-27 DIAGNOSIS — F03918 Unspecified dementia, unspecified severity, with other behavioral disturbance: Secondary | ICD-10-CM

## 2019-06-27 DIAGNOSIS — S72142A Displaced intertrochanteric fracture of left femur, initial encounter for closed fracture: Secondary | ICD-10-CM

## 2019-06-27 DIAGNOSIS — A499 Bacterial infection, unspecified: Secondary | ICD-10-CM | POA: Diagnosis not present

## 2019-06-27 MED ORDER — DIPHENHYDRAMINE HCL 12.5 MG/5ML PO ELIX: 12.5000 mg | ORAL_SOLUTION | Freq: Four times a day (QID) | ORAL | Status: DC | PRN

## 2019-06-27 MED ORDER — FLEET ENEMA 7-19 GM/118ML RE ENEM: 1.0000 | ENEMA | Freq: Once | RECTAL | Status: DC | PRN

## 2019-06-27 MED ORDER — PROCHLORPERAZINE 25 MG RE SUPP: 12.5000 mg | Freq: Four times a day (QID) | RECTAL | Status: DC | PRN

## 2019-06-27 MED ORDER — ACETAMINOPHEN 325 MG PO TABS
325.0000 mg | ORAL_TABLET | ORAL | Status: DC | PRN
  Administered 2019-06-28 – 2019-07-08 (×10): 650 mg via ORAL
  Filled 2019-06-27 (×11): qty 2

## 2019-06-27 MED ORDER — ALUM & MAG HYDROXIDE-SIMETH 200-200-20 MG/5ML PO SUSP: 30.0000 mL | ORAL | Status: DC | PRN

## 2019-06-27 MED ORDER — SENNA 8.6 MG PO TABS
1.0000 | ORAL_TABLET | Freq: Two times a day (BID) | ORAL | Status: DC
  Filled 2019-06-27 (×2): qty 1

## 2019-06-27 MED ORDER — OLANZAPINE 5 MG PO TBDP
5.0000 mg | ORAL_TABLET | Freq: Two times a day (BID) | ORAL | Status: DC | PRN
  Filled 2019-06-27: qty 1

## 2019-06-27 MED ORDER — PHENOL 1.4 % MT LIQD: 1.0000 | OROMUCOSAL | Status: DC | PRN

## 2019-06-27 MED ORDER — DOCUSATE SODIUM 100 MG PO CAPS
100.0000 mg | ORAL_CAPSULE | Freq: Two times a day (BID) | ORAL | Status: DC
  Filled 2019-06-27 (×2): qty 1

## 2019-06-27 MED ORDER — POLYETHYLENE GLYCOL 3350 17 G PO PACK: 17.0000 g | PACK | Freq: Every day | ORAL | Status: DC | PRN

## 2019-06-27 MED ORDER — GABAPENTIN 300 MG PO CAPS
300.0000 mg | ORAL_CAPSULE | Freq: Two times a day (BID) | ORAL | Status: DC
  Administered 2019-06-27 – 2019-07-08 (×22): 300 mg via ORAL
  Filled 2019-06-27 (×22): qty 1

## 2019-06-27 MED ORDER — MENTHOL 3 MG MT LOZG
1.0000 | LOZENGE | OROMUCOSAL | Status: DC | PRN
  Filled 2019-06-27: qty 9

## 2019-06-27 MED ORDER — MECLIZINE HCL 25 MG PO TABS: 25.0000 mg | ORAL_TABLET | Freq: Three times a day (TID) | ORAL | Status: DC | PRN

## 2019-06-27 MED ORDER — GUAIFENESIN-DM 100-10 MG/5ML PO SYRP: 5.0000 mL | ORAL_SOLUTION | Freq: Four times a day (QID) | ORAL | Status: DC | PRN

## 2019-06-27 MED ORDER — ASPIRIN EC 81 MG PO TBEC
81.0000 mg | DELAYED_RELEASE_TABLET | Freq: Every day | ORAL | Status: DC
  Administered 2019-06-28 – 2019-07-08 (×11): 81 mg via ORAL
  Filled 2019-06-27 (×11): qty 1

## 2019-06-27 MED ORDER — HYDROCODONE-ACETAMINOPHEN 10-325 MG PO TABS
1.0000 | ORAL_TABLET | Freq: Three times a day (TID) | ORAL | Status: DC | PRN
  Administered 2019-06-27 – 2019-07-01 (×8): 1 via ORAL
  Filled 2019-06-27 (×8): qty 1

## 2019-06-27 MED ORDER — METOPROLOL TARTRATE 50 MG PO TABS
50.0000 mg | ORAL_TABLET | Freq: Two times a day (BID) | ORAL | Status: DC
  Administered 2019-06-27 – 2019-07-08 (×21): 50 mg via ORAL
  Filled 2019-06-27 (×22): qty 1

## 2019-06-27 MED ORDER — PROCHLORPERAZINE MALEATE 5 MG PO TABS: 5.0000 mg | ORAL_TABLET | Freq: Four times a day (QID) | ORAL | Status: DC | PRN

## 2019-06-27 MED ORDER — ALLOPURINOL 100 MG PO TABS
100.0000 mg | ORAL_TABLET | Freq: Two times a day (BID) | ORAL | Status: DC
  Administered 2019-06-27 – 2019-07-01 (×8): 100 mg via ORAL
  Filled 2019-06-27 (×8): qty 1

## 2019-06-27 MED ORDER — SENNA 8.6 MG PO TABS: 1.0000 | ORAL_TABLET | Freq: Two times a day (BID) | ORAL | 0 refills | Status: DC

## 2019-06-27 MED ORDER — ENOXAPARIN SODIUM 40 MG/0.4ML ~~LOC~~ SOLN
40.0000 mg | SUBCUTANEOUS | Status: DC
  Administered 2019-06-27 – 2019-07-07 (×11): 40 mg via SUBCUTANEOUS
  Filled 2019-06-27 (×11): qty 0.4

## 2019-06-27 MED ORDER — LOSARTAN POTASSIUM 50 MG PO TABS
100.0000 mg | ORAL_TABLET | Freq: Every day | ORAL | Status: DC
  Administered 2019-06-28 – 2019-06-30 (×3): 100 mg via ORAL
  Filled 2019-06-27 (×5): qty 2

## 2019-06-27 MED ORDER — BISACODYL 10 MG RE SUPP: 10.0000 mg | Freq: Every day | RECTAL | Status: DC | PRN

## 2019-06-27 MED ORDER — PROCHLORPERAZINE EDISYLATE 10 MG/2ML IJ SOLN: 5.0000 mg | Freq: Four times a day (QID) | INTRAMUSCULAR | Status: DC | PRN

## 2019-06-27 MED ORDER — ALPRAZOLAM 0.25 MG PO TABS
0.2500 mg | ORAL_TABLET | Freq: Two times a day (BID) | ORAL | Status: DC | PRN
  Filled 2019-06-27: qty 2

## 2019-06-27 NOTE — Progress Notes (Signed)
Discharge report given to [facility] at [facility]. Condition stable. Eulas Post, RN

## 2019-06-27 NOTE — H&P (Signed)
Physical Medicine and Rehabilitation Admission H&P    Chief Complaint  Patient presents with  . Functional deficits due to hip fracture    HPI: Michelle Gregory is a 85 year old female with history of GERD, anxiety, HTN, vertigo, chronic LBP, colon cancer, episodic confusion, multiple falls--right distal radius fracture 2/20 and recurrent fall on 06/21/2019 with subsequent left IT femur fracture and inability to walk.  History taken from chart review.  She was seen by Ortho and underwent IM nail of left femur on 06/22/2019.  Post op to be WBAT.  Hospital course complicated by acute blood loss anemia, leukocytosis, as well as electrolyte abnormalities and UTI.  E coli UTI noted at admission and she was treated with 5 day course of IV rocephin. Hospital course further complicated by hypotension requiring fluid bolus 8/21, diarrhea felt to be due to antibiotics but continues on Senna as well as ongoing issues with confusion, agitation, delirium--worse 8/24.  Therapy ongoing and patient continues to be limited by pain, posterior lean with standing as well as cognitive deficits. Family requesting ?CIR to SNF as patient showing improvement in participation and was felt to be good CIR candidate.    Review of Systems  Constitutional: Negative for chills and fever.  HENT: Positive for hearing loss. Negative for tinnitus.   Eyes: Negative for blurred vision (better now).  Respiratory: Negative for shortness of breath.   Cardiovascular: Negative for chest pain (not at this time) and leg swelling.  Gastrointestinal: Negative for constipation, diarrhea, heartburn and nausea.  Genitourinary: Positive for frequency.  Musculoskeletal: Positive for back pain, joint pain and myalgias.  Neurological: Positive for weakness. Negative for dizziness and headaches.  Psychiatric/Behavioral: Positive for memory loss. The patient is not nervous/anxious.   All other systems reviewed and are negative.  Past Medical  History:  Diagnosis Date  . Allergic rhinitis   . Anxiety   . C. difficile diarrhea 10/2017  . Cancer ([place])   . Depression   . GERD (gastroesophageal reflux disease)   . History of colon cancer   . History of recurrent UTIs   . Hyperlipidemia   . Hypertension   . Osteoarthritis   . Spinal compression fracture ([place])   . Vertigo     Past Surgical History:  Procedure Laterality Date  . APPENDECTOMY    . CATARACT EXTRACTION    . COLON RESECTION    . INTRAMEDULLARY (IM) NAIL INTERTROCHANTERIC Left 06/22/2019   Procedure: INTRAMEDULLARY (IM) NAIL INTERTROCHANTRIC;  Surgeon: Rod Can, MD;  Location: [facility];  Service: Orthopedics;  Laterality: Left;  . TONSILLECTOMY       Family History  Problem Relation Age of Onset  . Prostate cancer Father   . Heart attack Mother   . Gout Mother     Social History:  reports that she has never smoked. She has never used smokeless tobacco. She reports that she does not drink alcohol or use drugs.    Allergies  Allergen Reactions  . Amoxicillin-Pot Clavulanate Diarrhea    Has patient had a PCN reaction causing immediate rash, facial/tongue/throat swelling, SOB or lightheadedness with hypotension: Yes Has patient had a PCN reaction causing severe rash involving mucus membranes or skin necrosis: No Has patient had a PCN reaction that required hospitalization: No Has patient had a PCN reaction occurring within the last 10 years: No If all of the above answers are "NO", then may proceed with Cephalosporin use.   Marland Kitchen Keflex [Cephalexin] Diarrhea  . Septra [  Sulfamethoxazole-Trimethoprim] Nausea Only    Medications Prior to Admission  Medication Sig Dispense Refill  . allopurinol (ZYLOPRIM) 100 MG tablet Take 2 tablets (200 mg total) by mouth daily. (Patient taking differently: Take 100 mg by mouth 2 (two) times daily. ) 180 tablet 1  . ALPRAZolam (XANAX) 0.5 MG tablet 1/2-1  pill by mouth up to twice daily as needed for anxiety or sleep 30  tablet 0  . Ascorbic Acid (VITAMIN C) 100 MG tablet Take 100 mg by mouth daily.    Marland Kitchen aspirin 81 MG tablet Take 81 mg by mouth daily.      . calcium-vitamin D (CALCIUM 500 +D) 500 MG tablet Take 1 tablet by mouth daily.     Marland Kitchen CRANBERRY PO Take 1 capsule by mouth 2 (two) times daily.    Marland Kitchen gabapentin (NEURONTIN) 300 MG capsule Take 300 mg by mouth 2 (two) times daily.   6  . glucosamine-chondroitin 500-400 MG tablet Take 1 tablet by mouth daily.    Marland Kitchen HYDROcodone-acetaminophen (NORCO) 10-325 MG tablet Take 1 tablet by mouth 3 (three) times daily as needed for moderate pain.    Marland Kitchen losartan (COZAAR) 100 MG tablet Take 1 tablet (100 mg total) by mouth daily. 90 tablet 1  . meclizine (ANTIVERT) 25 MG tablet Take 1 tablet (25 mg total) by mouth 3 (three) times daily as needed for dizziness. 270 tablet 0  . metoprolol tartrate (LOPRESSOR) 50 MG tablet Take 1 tablet (50 mg total) by mouth 2 (two) times daily. 180 tablet 1  . multivitamin (THERAGRAN) per tablet Take 1 tablet by mouth daily.      Marland Kitchen PARoxetine (PAXIL) 30 MG tablet Take 1 tablet (30 mg total) by mouth every morning. 90 tablet 1  . Probiotic Product (PROBIOTIC DAILY PO) Take 1 tablet by mouth daily.    . raloxifene (EVISTA) 60 MG tablet Take 1 tablet (60 mg total) by mouth daily. 90 tablet 1  . nitrofurantoin, macrocrystal-monohydrate, (MACROBID) 100 MG capsule Take 1 capsule (100 mg total) by mouth 2 (two) times daily. (Patient not taking: Reported on 06/21/2019) 14 capsule 0  . valACYclovir (VALTREX) 1000 MG tablet Take 1 tablet (1,000 mg total) by mouth 3 (three) times daily. (Patient not taking: Reported on 06/21/2019) 21 tablet 0  . [DISCONTINUED] HYDROcodone-acetaminophen (NORCO/VICODIN) 5-325 MG tablet Take 1 tablet by mouth every 4 (four) hours as needed for moderate pain. (Patient not taking: Reported on 06/21/2019) 20 tablet 0    Drug Regimen Review  Drug regimen was reviewed and remains appropriate with no significant issues identified   Home: Home Living Family/patient expects to be discharged to:: Unsure Living Arrangements: (satys at her home 1130 am until 6 pm daily; othersie at her ) Available Help at Discharge: Family, Available 24 hours/day(daughter can provide 24/7 supervision at her home; next door) Type of Home: House Home Access: Stairs to enter [employer] of Steps: 5 Entrance Stairs-Rails: Right, Left, Can reach both Home Layout: One level Bathroom Shower/Tub: Chiropodist: Handicapped height Bathroom Accessibility: Yes Home Equipment: [place] - single point, Environmental consultant - 2 wheels, Environmental consultant - 4 wheels, Shower seat, Grab bars - tub/shower, Grab bars - toilet  Lives With: Daughter   Functional History: Prior Function Level of Independence: Independent, Needs assistance Gait / Transfers Assistance Needed: no AD used ADL's / Homemaking Assistance Needed: daughter assist with tub transfers Comments: pt stays with daughter overnight at daughter's house. Pt brought back to her house during the day  Functional  Status:  Mobility: Bed Mobility Overal bed mobility: Needs Assistance Bed Mobility: Supine to Sit Supine to sit: Mod assist Sit to supine: Total assist, +2 for physical assistance General bed mobility comments: cues for self assist, pt able to move R LE however assist provided L LE due to pain, pt assisted with trunk upright however required assist to scoot to EOB so utilized bed pad, pt continues to lean to right side to avoid sitting on left hip Transfers Overall transfer level: Needs assistance Equipment used: Rolling walker (2 wheeled) Transfers: Stand Pivot Transfers, Sit to/from Stand Sit to Stand: Max assist, From elevated surface, +2 physical assistance Stand pivot transfers: Max assist, +2 physical assistance General transfer comment: verbal cues for hand placement, pt with posterior lean upon standing requiring assist and cues to correct; also increased cues to take  smalls steps for pivoting transfers; pt assisted to [facility] and then to recliner Ambulation/Gait General Gait Details: deferred due to assist level and pt fatigue after using BSC    ADL: ADL Overall ADL's : Needs assistance/impaired Eating/Feeding: Set up, Sitting Grooming: Set up, Minimal assistance, Sitting Upper Body Bathing: Maximal assistance, Bed level Lower Body Bathing: Total assistance, +2 for physical assistance Upper Body Dressing : Maximal assistance, Bed level Lower Body Dressing: Total assistance, +2 for physical assistance, Bed level General ADL Comments: Partially sat EOB. Unable to bear weight onto LLE. Returned to supine d/t pain.  Cognition: Cognition Overall Cognitive Status: History of cognitive impairments - at baseline Orientation Level: Oriented to person, Disoriented to place, Disoriented to time, Disoriented to situation Cognition Arousal/Alertness: Awake/alert Behavior During Therapy: WFL for tasks assessed/performed Overall Cognitive Status: History of cognitive impairments - at baseline General Comments: appropriately conversational (hx of dementia per chart); pleasant and follows commands   Blood pressure 140/88, pulse 94, temperature 97.7 F (36.5 C), temperature source Oral, resp. rate 16, height 5' (1.524 m), weight 54.4 kg, last menstrual period 11/02/1980, SpO2 100 %. Physical Exam  Nursing note and vitals reviewed. Constitutional: She appears well-developed. She appears cachectic. She is cooperative. No distress.  Fatigued appearing.   HENT:  Head: Normocephalic and atraumatic.  Eyes: EOM are normal. Right eye exhibits no discharge. Left eye exhibits no discharge.  Neck: No tracheal deviation present. No thyromegaly present.  Respiratory: Effort normal. No respiratory distress.  GI: She exhibits no distension.  Musculoskeletal:     Comments: Left hip with edema and tenderness  Neurological: She is alert.  Disoriented HOH Able to follow basic  commands without difficulty.  Motor: Bilateral upper extremities: 4/5 proximal distal Right lower extremity: 4-/5 hip flexion, knee extension, 4/5 ankle dorsiflexion Left lower extremity: 2/5 hip flexion, knee extension (pain inhibition), 3/5 ankle dorsiflexion  Skin: She is not diaphoretic.  Left hip incision C/D/I  Left proximal lower extremity with ecchymosis   Psychiatric: She has a normal mood and affect. Her behavior is normal.    Results for orders placed or performed during the hospital encounter of 06/21/19 (from the past 48 hour(s))  CBC     Status: Abnormal   Collection Time: 06/26/19  5:04 AM  Result Value Ref Range   WBC 10.9 (H) 4.0 - 10.5 K/uL   RBC 2.95 (L) 3.87 - 5.11 MIL/uL   Hemoglobin 9.5 (L) 12.0 - 15.0 g/dL   HCT 28.8 (L) 36.0 - 46.0 %   MCV 97.6 80.0 - 100.0 fL   MCH 32.2 26.0 - 34.0 pg   MCHC 33.0 30.0 - 36.0 g/dL  RDW 14.3 11.5 - 15.5 %   Platelets PLATELET CLUMPS NOTED ON SMEAR, UNABLE TO ESTIMATE 150 - 400 K/uL    Comment: Immature Platelet Fraction may be clinically indicated, consider ordering this additional test [id number]    nRBC 0.0 0.0 - 0.2 %    Comment: Performed at [facility], [place] [address], [place], [place] [address]   No results found.     Medical Problem List and Plan: 1.  Deficits with mobility, self-care, delirium, agitation secondary to left hip fracture status post IM nail.  Admit to CIR 2.  Antithrombotics: -DVT/anticoagulation:  Pharmaceutical: Lovenox  -antiplatelet therapy: ASA 3. Chronic LBP/Pain Management: Continue gabapentin bid with Norco prn  Monitor with increased mobility 4. Mood: LCSW to follow for evaluation and support.   -antipsychotic agents: Continue ativan prn for agitation/sleep 5. Neuropsych: This patient is not capable of making decisions on her own behalf. 6. Skin/Wound Care: Monitor wound daily for healing.  7. Fluids/Electrolytes/Nutrition: Monitor I/Os. Offer fluids frequently as  getting dehydrated. BUN 22--->32 on last check.  BMP ordered for tomorrow a.m. 8. HTN: Continue Metoprolol bid and Cozaar daily.  Monitor with increased mobility..   9. Delirum: Question dementia worsened in hospital setting--per records has issues with confusion at nights and early am. Sleep wake chart. Anticipate worsening with change of setting.  Will add zyprexa prn at bedtime  10. Diarrhea?: Has history of C diff/colon cancer.  Discontinue Senokot.  11. ABLA: Continue to monitor with serial checks.  CBC ordered for tomorrow a.m. 12. Thrombocytopenia: Monitor with serial checks as on Lovenox. Monitor for signs of bleeding.  CBC ordered for tomorrow a.m. 13.  E coli UTI: Completed 5 days of rocephin --recheck urine culture for clearing due to ongoing delirium. Monitor WBC/temps for signs of infection.   Bary Leriche, PA-C 06/27/2019

## 2019-06-27 NOTE — Progress Notes (Signed)
Received pt. As a new admission.Pt. and her daughter have been oriented to rehab.

## 2019-06-27 NOTE — H&P (Signed)
Physical Medicine and Rehabilitation Admission H&P    Chief Complaint  Patient presents with  . Functional deficits due to hip fracture    HPI: Michelle Gregory is a 85 year old female with history of GERD, anxiety, HTN, vertigo, chronic LBP, colon cancer, episodic confusion, multiple falls--right distal radius fracture 2/20 and recurrent fall on 06/21/2019 with subsequent left IT femur fracture and inability to walk.  History taken from chart review.  She was seen by Ortho and underwent IM nail of left femur on 06/22/2019.  Post op to be WBAT.  Hospital course complicated by acute blood loss anemia, leukocytosis, as well as electrolyte abnormalities and UTI.  E coli UTI noted at admission and she was treated with 5 day course of IV rocephin. Hospital course further complicated by hypotension requiring fluid bolus 8/21, diarrhea felt to be due to antibiotics but continues on Senna as well as ongoing issues with confusion, agitation, delirium--worse 8/24.  Therapy ongoing and patient continues to be limited by pain, posterior lean with standing as well as cognitive deficits. Family requesting ?CIR to SNF as patient showing improvement in participation and was felt to be good CIR candidate.    Review of Systems  Constitutional: Negative for chills and fever.  HENT: Positive for hearing loss. Negative for tinnitus.   Eyes: Negative for blurred vision (better now).  Respiratory: Negative for shortness of breath.   Cardiovascular: Negative for chest pain (not at this time) and leg swelling.  Gastrointestinal: Negative for constipation, diarrhea, heartburn and nausea.  Genitourinary: Positive for frequency.  Musculoskeletal: Positive for back pain, joint pain and myalgias.  Neurological: Positive for weakness. Negative for dizziness and headaches.  Psychiatric/Behavioral: Positive for memory loss. The patient is not nervous/anxious.   All other systems reviewed and are negative.  Past Medical  History:  Diagnosis Date  . Allergic rhinitis   . Anxiety   . C. difficile diarrhea 10/2017  . Cancer ([place])   . Depression   . GERD (gastroesophageal reflux disease)   . History of colon cancer   . History of recurrent UTIs   . Hyperlipidemia   . Hypertension   . Osteoarthritis   . Spinal compression fracture ([place])   . Vertigo     Past Surgical History:  Procedure Laterality Date  . APPENDECTOMY    . CATARACT EXTRACTION    . COLON RESECTION    . INTRAMEDULLARY (IM) NAIL INTERTROCHANTERIC Left 06/22/2019   Procedure: INTRAMEDULLARY (IM) NAIL INTERTROCHANTRIC;  Surgeon: Rod Can, MD;  Location: [facility];  Service: Orthopedics;  Laterality: Left;  . TONSILLECTOMY       Family History  Problem Relation Age of Onset  . Prostate cancer Father   . Heart attack Mother   . Gout Mother     Social History:  reports that she has never smoked. She has never used smokeless tobacco. She reports that she does not drink alcohol or use drugs.    Allergies  Allergen Reactions  . Amoxicillin-Pot Clavulanate Diarrhea    Has patient had a PCN reaction causing immediate rash, facial/tongue/throat swelling, SOB or lightheadedness with hypotension: Yes Has patient had a PCN reaction causing severe rash involving mucus membranes or skin necrosis: No Has patient had a PCN reaction that required hospitalization: No Has patient had a PCN reaction occurring within the last 10 years: No If all of the above answers are "NO", then may proceed with Cephalosporin use.   Marland Kitchen Keflex [Cephalexin] Diarrhea  . Septra [  Sulfamethoxazole-Trimethoprim] Nausea Only    Medications Prior to Admission  Medication Sig Dispense Refill  . allopurinol (ZYLOPRIM) 100 MG tablet Take 2 tablets (200 mg total) by mouth daily. (Patient taking differently: Take 100 mg by mouth 2 (two) times daily. ) 180 tablet 1  . ALPRAZolam (XANAX) 0.5 MG tablet 1/2-1  pill by mouth up to twice daily as needed for anxiety or sleep 30  tablet 0  . Ascorbic Acid (VITAMIN C) 100 MG tablet Take 100 mg by mouth daily.    Marland Kitchen aspirin 81 MG tablet Take 81 mg by mouth daily.      . calcium-vitamin D (CALCIUM 500 +D) 500 MG tablet Take 1 tablet by mouth daily.     Marland Kitchen CRANBERRY PO Take 1 capsule by mouth 2 (two) times daily.    Marland Kitchen gabapentin (NEURONTIN) 300 MG capsule Take 300 mg by mouth 2 (two) times daily.   6  . glucosamine-chondroitin 500-400 MG tablet Take 1 tablet by mouth daily.    Marland Kitchen HYDROcodone-acetaminophen (NORCO) 10-325 MG tablet Take 1 tablet by mouth 3 (three) times daily as needed for moderate pain.    Marland Kitchen losartan (COZAAR) 100 MG tablet Take 1 tablet (100 mg total) by mouth daily. 90 tablet 1  . meclizine (ANTIVERT) 25 MG tablet Take 1 tablet (25 mg total) by mouth 3 (three) times daily as needed for dizziness. 270 tablet 0  . metoprolol tartrate (LOPRESSOR) 50 MG tablet Take 1 tablet (50 mg total) by mouth 2 (two) times daily. 180 tablet 1  . multivitamin (THERAGRAN) per tablet Take 1 tablet by mouth daily.      Marland Kitchen PARoxetine (PAXIL) 30 MG tablet Take 1 tablet (30 mg total) by mouth every morning. 90 tablet 1  . Probiotic Product (PROBIOTIC DAILY PO) Take 1 tablet by mouth daily.    . raloxifene (EVISTA) 60 MG tablet Take 1 tablet (60 mg total) by mouth daily. 90 tablet 1  . nitrofurantoin, macrocrystal-monohydrate, (MACROBID) 100 MG capsule Take 1 capsule (100 mg total) by mouth 2 (two) times daily. (Patient not taking: Reported on 06/21/2019) 14 capsule 0  . valACYclovir (VALTREX) 1000 MG tablet Take 1 tablet (1,000 mg total) by mouth 3 (three) times daily. (Patient not taking: Reported on 06/21/2019) 21 tablet 0  . [DISCONTINUED] HYDROcodone-acetaminophen (NORCO/VICODIN) 5-325 MG tablet Take 1 tablet by mouth every 4 (four) hours as needed for moderate pain. (Patient not taking: Reported on 06/21/2019) 20 tablet 0    Drug Regimen Review  Drug regimen was reviewed and remains appropriate with no significant issues identified   Home: Home Living Family/patient expects to be discharged to:: Unsure Living Arrangements: (satys at her home 1130 am until 6 pm daily; othersie at her ) Available Help at Discharge: Family, Available 24 hours/day(daughter can provide 24/7 supervision at her home; next door) Type of Home: House Home Access: Stairs to enter [employer] of Steps: 5 Entrance Stairs-Rails: Right, Left, Can reach both Home Layout: One level Bathroom Shower/Tub: Chiropodist: Handicapped height Bathroom Accessibility: Yes Home Equipment: [place] - single point, Environmental consultant - 2 wheels, Environmental consultant - 4 wheels, Shower seat, Grab bars - tub/shower, Grab bars - toilet  Lives With: Daughter   Functional History: Prior Function Level of Independence: Independent, Needs assistance Gait / Transfers Assistance Needed: no AD used ADL's / Homemaking Assistance Needed: daughter assist with tub transfers Comments: pt stays with daughter overnight at daughter's house. Pt brought back to her house during the day  Functional  Status:  Mobility: Bed Mobility Overal bed mobility: Needs Assistance Bed Mobility: Supine to Sit Supine to sit: Mod assist Sit to supine: Total assist, +2 for physical assistance General bed mobility comments: cues for self assist, pt able to move R LE however assist provided L LE due to pain, pt assisted with trunk upright however required assist to scoot to EOB so utilized bed pad, pt continues to lean to right side to avoid sitting on left hip Transfers Overall transfer level: Needs assistance Equipment used: Rolling walker (2 wheeled) Transfers: Stand Pivot Transfers, Sit to/from Stand Sit to Stand: Max assist, From elevated surface, +2 physical assistance Stand pivot transfers: Max assist, +2 physical assistance General transfer comment: verbal cues for hand placement, pt with posterior lean upon standing requiring assist and cues to correct; also increased cues to take  smalls steps for pivoting transfers; pt assisted to [facility] and then to recliner Ambulation/Gait General Gait Details: deferred due to assist level and pt fatigue after using BSC    ADL: ADL Overall ADL's : Needs assistance/impaired Eating/Feeding: Set up, Sitting Grooming: Set up, Minimal assistance, Sitting Upper Body Bathing: Maximal assistance, Bed level Lower Body Bathing: Total assistance, +2 for physical assistance Upper Body Dressing : Maximal assistance, Bed level Lower Body Dressing: Total assistance, +2 for physical assistance, Bed level General ADL Comments: Partially sat EOB. Unable to bear weight onto LLE. Returned to supine d/t pain.  Cognition: Cognition Overall Cognitive Status: History of cognitive impairments - at baseline Orientation Level: Oriented to person, Disoriented to place, Disoriented to time, Disoriented to situation Cognition Arousal/Alertness: Awake/alert Behavior During Therapy: WFL for tasks assessed/performed Overall Cognitive Status: History of cognitive impairments - at baseline General Comments: appropriately conversational (hx of dementia per chart); pleasant and follows commands   Blood pressure 140/88, pulse 94, temperature 97.7 F (36.5 C), temperature source Oral, resp. rate 16, height 5' (1.524 m), weight 54.4 kg, last menstrual period 11/02/1980, SpO2 100 %. Physical Exam  Nursing note and vitals reviewed. Constitutional: She appears well-developed. She appears cachectic. She is cooperative. No distress.  Fatigued appearing.   HENT:  Head: Normocephalic and atraumatic.  Eyes: EOM are normal. Right eye exhibits no discharge. Left eye exhibits no discharge.  Neck: No tracheal deviation present. No thyromegaly present.  Respiratory: Effort normal. No respiratory distress.  GI: She exhibits no distension.  Musculoskeletal:     Comments: Left hip with edema and tenderness  Neurological: She is alert.  Disoriented HOH Able to follow basic  commands without difficulty.  Motor: Bilateral upper extremities: 4/5 proximal distal Right lower extremity: 4-/5 hip flexion, knee extension, 4/5 ankle dorsiflexion Left lower extremity: 2/5 hip flexion, knee extension (pain inhibition), 3/5 ankle dorsiflexion  Skin: She is not diaphoretic.  Left hip incision C/D/I  Left proximal lower extremity with ecchymosis   Psychiatric: She has a normal mood and affect. Her behavior is normal.    Results for orders placed or performed during the hospital encounter of 06/21/19 (from the past 48 hour(s))  CBC     Status: Abnormal   Collection Time: 06/26/19  5:04 AM  Result Value Ref Range   WBC 10.9 (H) 4.0 - 10.5 K/uL   RBC 2.95 (L) 3.87 - 5.11 MIL/uL   Hemoglobin 9.5 (L) 12.0 - 15.0 g/dL   HCT 28.8 (L) 36.0 - 46.0 %   MCV 97.6 80.0 - 100.0 fL   MCH 32.2 26.0 - 34.0 pg   MCHC 33.0 30.0 - 36.0 g/dL  RDW 14.3 11.5 - 15.5 %   Platelets PLATELET CLUMPS NOTED ON SMEAR, UNABLE TO ESTIMATE 150 - 400 K/uL    Comment: Immature Platelet Fraction may be clinically indicated, consider ordering this additional test [id number]    nRBC 0.0 0.0 - 0.2 %    Comment: Performed at [facility], [place] [address], [place], [place] [address]   No results found.     Medical Problem List and Plan: 1.  Deficits with mobility, self-care, delirium, agitation secondary to left hip fracture status post IM nail.  Admit to CIR 2.  Antithrombotics: -DVT/anticoagulation:  Pharmaceutical: Lovenox  -antiplatelet therapy: ASA 3. Chronic LBP/Pain Management: Continue gabapentin bid with Norco prn  Monitor with increased mobility 4. Mood: LCSW to follow for evaluation and support.   -antipsychotic agents: Continue ativan prn for agitation/sleep 5. Neuropsych: This patient is not capable of making decisions on her own behalf. 6. Skin/Wound Care: Monitor wound daily for healing.  7. Fluids/Electrolytes/Nutrition: Monitor I/Os. Offer fluids frequently as  getting dehydrated. BUN 22--->32 on last check.  BMP ordered for tomorrow a.m. 8. HTN: Continue Metoprolol bid and Cozaar daily.  Monitor with increased mobility..   9. Delirum: Question dementia worsened in hospital setting--per records has issues with confusion at nights and early am. Sleep wake chart. Anticipate worsening with change of setting.  Will add zyprexa prn at bedtime  10. Diarrhea?: Has history of C diff/colon cancer.  Discontinue Senokot.  11. ABLA: Continue to monitor with serial checks.  CBC ordered for tomorrow a.m. 12. Thrombocytopenia: Monitor with serial checks as on Lovenox. Monitor for signs of bleeding.  CBC ordered for tomorrow a.m. 13.  E coli UTI: Completed 5 days of rocephin --recheck urine culture for clearing due to ongoing delirium. Monitor WBC/temps for signs of infection.   Post Admission Physician Evaluation: 1. Preadmission assessment reviewed and changes made below. 2. Functional deficits secondary  to left hip fracture status post IM nail. 3. Patient is admitted to receive collaborative, interdisciplinary care between the physiatrist, rehab nursing staff, and therapy team. 4. Patient has experienced substantial functional loss from his/her baseline which was documented above under the "Functional History" and "Functional Status" headings.  Judging by the patient's diagnosis, physical exam, and functional history, the patient has potential for functional progress which will result in measurable gains while on inpatient rehab.  These gains will be of substantial and practical use upon discharge  in facilitating mobility and self-care at the household level. 5. Physiatrist will provide 24 hour management of medical needs as well as oversight of the therapy plan/treatment and provide guidance as appropriate regarding the interaction of the two. 6. 24 hour rehab nursing will assist with bladder management, safety, skin/wound care, disease management, medication  administration, pain management and patient education  and help integrate therapy concepts, techniques,education, etc. 7. PT will assess and treat for/with: Lower extremity strength, range of motion, stamina, balance, functional mobility, safety, adaptive techniques and equipment, wound care, coping skills, pain control, education. Goals are: Min/Mod A. 8. OT will assess and treat for/with: ADL's, functional mobility, safety, upper extremity strength, adaptive techniques and equipment, wound mgt, ego support, and community reintegration.   Goals are: Min/Mod A. Therapy may proceed with showering this patient. 9. SLP will assess and treat for/with: Cognition.  Goals are: Min A. 10. Case Management and Social Worker will assess and treat for psychological issues and discharge planning. 11. Team conference will be held weekly to assess progress toward goals and to  determine barriers to discharge. 12. Patient will receive at least 3 hours of therapy per day at least 5 days per week. 13. ELOS: 13-17 days.       14. Prognosis:  good  I have personally performed a face to face diagnostic evaluation, including, but not limited to relevant history and physical exam findings, of this patient and developed relevant assessment and plan.  Additionally, I have reviewed and concur with the physician assistant's documentation above.  Delice Lesch, MD, ABPMR Bary Leriche, PA-C 06/27/2019

## 2019-06-27 NOTE — Discharge Summary (Signed)
Physician Discharge Summary  JOVANNI MONTE [id number] DOB: Apr 11, 1923 DOA: 06/21/2019  PCP: Abner Greenspan, MD  Admit date: 06/21/2019 Discharge date: 06/27/2019  Admitted From: Home Disposition: CIR  Recommendations for Outpatient Follow-up:  1. Follow-up with orthopedics (Dr. Lyla Glassing) in 2 weeks   Equipment/Devices: As per therapy  Discharge Condition: Fair CODE STATUS: Full code (daughter will clarify living will with the patient and inform) Diet recommendation: Regular    Discharge Diagnoses:  Principal Problem:   Closed left hip fracture, initial encounter ([place])  Active Problems:   Hyperlipidemia   Essential hypertension   GERD   Prediabetes   Gout   Dementia, senile (HCC)   Thrombocytopenia (HCC)   Anxiety with depression   Senile dementia with delirium without behavioral disturbance (HCC)   E-coli UTI   Acute blood loss as cause of postoperative anemia  Brief narrative/HPI 85 year old female with a history of anxiety, depression, C. difficile, GERD, colon cancer, recurrent UTIs, hypertension, osteoarthritis brought to the ED after sustaining a fall at home with the x-ray showing left intertrochanteric femur fracture.  Patient seen by orthopedics and taken to the OR for intramedullary fixation of the left femur.  Hospital course Principal Problem:   Closed left hip fracture, initial encounter [facility]) Status post left femur intramedullary fixation.  Tolerated surgery well. Pain control with PRN Norco. Continue bowel regimen.  DVT prophylaxis with subcu Lovenox. PT recommends CIR.  Stable to be discharged there with outpatient follow-up with orthopedics in 2 weeks.    Active Problems: Acute blood loss anemia/thrombocytopenia Postsurgical blood loss with drop in hemoglobin to 6.9.  (Baseline of 12).  Improved to >9 with 1 unit PRBC. Platelets level improved as well.  Hypotension and oliguria Possibly due to hypovolemia.  Blood pressure meds were held and  given IV fluids with improvement.  Now stable.  Diarrhea Likely secondary to antibiotic use.    Resolved after antibiotic discontinued.  Leukocytosis   Reactive + associated with UTI.  E. coli urinary tract infection Pansensitive.  Completed 5 days of Rocephin.  Hypokalemia/hypomagnesemia Replenished   GERD Continue PPI  Gout Continue allopurinol.  Stable  Delirium Likely sundowning and associated with infection/pain.   Monitor.     Family Communication  : Spoke with daughter   Disposition Plan  :  CIR  Consults  : Orthopedics (Dr. Lyla Glassing)  Procedures  : IM nailing of left femur fracture Discharge Instructions   Allergies as of 06/27/2019      Reactions   Amoxicillin-pot Clavulanate Diarrhea   Has patient had a PCN reaction causing immediate rash, facial/tongue/throat swelling, SOB or lightheadedness with hypotension: Yes Has patient had a PCN reaction causing severe rash involving mucus membranes or skin necrosis: No Has patient had a PCN reaction that required hospitalization: No Has patient had a PCN reaction occurring within the last 10 years: No If all of the above answers are "NO", then may proceed with Cephalosporin use.   Keflex [cephalexin] Diarrhea   Septra [sulfamethoxazole-trimethoprim] Nausea Only      Medication List    STOP taking these medications   nitrofurantoin (macrocrystal-monohydrate) 100 MG capsule Commonly known as: MACROBID     TAKE these medications   allopurinol 100 MG tablet Commonly known as: ZYLOPRIM Take 2 tablets (200 mg total) by mouth daily. What changed:   how much to take  when to take this   ALPRAZolam 0.5 MG tablet Commonly known as: XANAX 1/2-1  pill by mouth up to twice daily as needed  for anxiety or sleep   aspirin 81 MG tablet Take 81 mg by mouth daily.   calcium-vitamin D 500 MG tablet Take 1 tablet by mouth daily.   CRANBERRY PO Take 1 capsule by mouth 2 (two) times daily.    enoxaparin 30 MG/0.3ML injection Commonly known as: LOVENOX Inject 0.3 mLs (30 mg total) into the skin daily.   gabapentin 300 MG capsule Commonly known as: NEURONTIN Take 300 mg by mouth 2 (two) times daily.   HYDROcodone-acetaminophen 5-325 MG tablet Commonly known as: NORCO/VICODIN Take 1 tablet by mouth every 4 (four) hours as needed for moderate pain. What changed: Another medication with the same name was removed. Continue taking this medication, and follow the directions you see here.   losartan 100 MG tablet Commonly known as: COZAAR Take 1 tablet (100 mg total) by mouth daily.   meclizine 25 MG tablet Commonly known as: ANTIVERT Take 1 tablet (25 mg total) by mouth 3 (three) times daily as needed for dizziness.   metoprolol tartrate 50 MG tablet Commonly known as: LOPRESSOR Take 1 tablet (50 mg total) by mouth 2 (two) times daily.   multivitamin per tablet Take 1 tablet by mouth daily.   PARoxetine 30 MG tablet Commonly known as: PAXIL Take 1 tablet (30 mg total) by mouth every morning.   PROBIOTIC DAILY PO Take 1 tablet by mouth daily.   raloxifene 60 MG tablet Commonly known as: EVISTA Take 1 tablet (60 mg total) by mouth daily.   senna 8.6 MG Tabs tablet Commonly known as: SENOKOT Take 1 tablet (8.6 mg total) by mouth 2 (two) times daily.   valACYclovir 1000 MG tablet Commonly known as: VALTREX Take 1 tablet (1,000 mg total) by mouth 3 (three) times daily.   vitamin C 100 MG tablet Take 100 mg by mouth daily.     ASK your doctor about these medications   glucosamine-chondroitin 500-400 MG tablet Take 1 tablet by mouth daily. Ask about: Which instructions should I use?      Follow-up Information    Swinteck, Aaron Edelman, MD. Schedule an appointment as soon as possible for a visit in 2 weeks.   Specialty: Orthopedic Surgery Why: For wound re-check Contact information: [address] [place] [address] [phone]           Allergies  Allergen Reactions  . Amoxicillin-Pot Clavulanate Diarrhea    Has patient had a PCN reaction causing immediate rash, facial/tongue/throat swelling, SOB or lightheadedness with hypotension: Yes Has patient had a PCN reaction causing severe rash involving mucus membranes or skin necrosis: No Has patient had a PCN reaction that required hospitalization: No Has patient had a PCN reaction occurring within the last 10 years: No If all of the above answers are "NO", then may proceed with Cephalosporin use.   Marland Kitchen Keflex [Cephalexin] Diarrhea  . Septra [Sulfamethoxazole-Trimethoprim] Nausea Only     Procedures/Studies: Dg Tibia/fibula Right  Result Date: 06/23/2019 CLINICAL DATA:  Right lower leg injury and pain after a fall 06/21/2019. EXAM: RIGHT TIBIA AND FIBULA - 2 VIEW COMPARISON:  None. FINDINGS: No acute bony or joint abnormality is identified. The patient has advanced tricompartmental osteoarthritis of the right knee. Soft tissues are unremarkable. IMPRESSION: No acute abnormality. Advanced osteoarthritis right knee. Electronically Signed   By: Inge Rise M.D.   On: 06/23/2019 09:55   Ct Head Wo Contrast  Result Date: 06/22/2019 CLINICAL DATA:  Fall.  Headache. EXAM: CT HEAD WITHOUT CONTRAST CT CERVICAL SPINE WITHOUT CONTRAST  TECHNIQUE: Multidetector CT imaging of the head and cervical spine was performed following the standard protocol without intravenous contrast. Multiplanar CT image reconstructions of the cervical spine were also generated. COMPARISON:  CT head 03/19/2019 FINDINGS: CT HEAD FINDINGS Brain: Generalized atrophy.  Mild white matter changes. Negative for acute infarct, hemorrhage, mass.  No midline shift. Vascular: Negative for hyperdense vessel. Atherosclerotic calcification. Skull: Negative for skull fracture Sinuses/Orbits: Mild mucosal edema paranasal sinuses. Bilateral cataract surgery. Other: None CT CERVICAL SPINE FINDINGS Alignment: Mild anterolisthesis  C4-5. Straightening of the cervical lordosis. Skull base and vertebrae: Negative for fracture Soft tissues and spinal canal: Atherosclerotic carotid bifurcation bilaterally. No soft tissue mass Disc levels: Multilevel cervical spondylosis with disc degeneration and facet degeneration. Disc space narrowing and spurring most prominent C4-5 and C5-6. Moderate spinal stenosis at C5-6. Upper chest: Lung apices clear bilaterally. Apically scarring bilaterally. Other: None IMPRESSION: 1. Atrophy and chronic microvascular ischemia. No acute intracranial abnormality. 2. Cervical spondylosis without acute fracture. Electronically Signed   By: Franchot Gallo M.D.   On: 06/22/2019 15:48   Ct Cervical Spine Wo Contrast  Result Date: 06/22/2019 CLINICAL DATA:  Fall.  Headache. EXAM: CT HEAD WITHOUT CONTRAST CT CERVICAL SPINE WITHOUT CONTRAST TECHNIQUE: Multidetector CT imaging of the head and cervical spine was performed following the standard protocol without intravenous contrast. Multiplanar CT image reconstructions of the cervical spine were also generated. COMPARISON:  CT head 03/19/2019 FINDINGS: CT HEAD FINDINGS Brain: Generalized atrophy.  Mild white matter changes. Negative for acute infarct, hemorrhage, mass.  No midline shift. Vascular: Negative for hyperdense vessel. Atherosclerotic calcification. Skull: Negative for skull fracture Sinuses/Orbits: Mild mucosal edema paranasal sinuses. Bilateral cataract surgery. Other: None CT CERVICAL SPINE FINDINGS Alignment: Mild anterolisthesis C4-5. Straightening of the cervical lordosis. Skull base and vertebrae: Negative for fracture Soft tissues and spinal canal: Atherosclerotic carotid bifurcation bilaterally. No soft tissue mass Disc levels: Multilevel cervical spondylosis with disc degeneration and facet degeneration. Disc space narrowing and spurring most prominent C4-5 and C5-6. Moderate spinal stenosis at C5-6. Upper chest: Lung apices clear bilaterally. Apically  scarring bilaterally. Other: None IMPRESSION: 1. Atrophy and chronic microvascular ischemia. No acute intracranial abnormality. 2. Cervical spondylosis without acute fracture. Electronically Signed   By: Franchot Gallo M.D.   On: 06/22/2019 15:48   Ct Lumbar Spine Wo Contrast  Result Date: 06/22/2019 CLINICAL DATA:  Fall.  Back pain.  History of fracture. EXAM: CT LUMBAR SPINE WITHOUT CONTRAST TECHNIQUE: Multidetector CT imaging of the lumbar spine was performed without intravenous contrast administration. Multiplanar CT image reconstructions were also generated. COMPARISON:  CT lumbar 04/02/2018 FINDINGS: Segmentation: Normal Alignment: Approximately 10 mm retrolisthesis L1 relative to L3. Burst fracture L2 unchanged. Mild anterolisthesis L3-4 L4-5 Vertebrae: Moderate compression fracture T12 is chronic and unchanged Fracture of the inferior endplate anteriorly of L1 is chronic and unchanged Severe burst fracture of L2 is unchanged with cement in the anterior vertebral body. There is extensive retropulsion of bone into the canal unchanged. No acute fracture.  Chronic fractures of the sacrum bilaterally. Paraspinal and other soft tissues: Negative for paraspinous mass or edema. Disc levels: T11-12: Mild to moderate spinal stenosis due to T12 fracture and spurring T12-L1: Disc and facet degeneration without stenosis L1-2: Severe spinal stenosis due to retropulsion of L2 into the canal. Spinal stenosis unchanged from the prior study. Bilateral facet degeneration contributes to stenosis L2-3: Mild disc and facet degeneration without significant spinal stenosis L3-4: Severe facet degeneration and diffuse disc bulging. Moderate spinal stenosis unchanged L4-5:  Severe facet degeneration. Diffuse disc bulging. Moderate spinal stenosis and moderate to severe subarticular stenosis bilaterally without interval change. L5-S1: Severe facet degeneration and mild disc degeneration. Decompressive laminectomy without significant  spinal stenosis. IMPRESSION: Negative for acute lumbar fracture Chronic compression fractures T12, L1, L2. Chronic burst fracture L2 with retropulsion of bone in the canal and severe spinal stenosis unchanged from 1 year ago. Multilevel degenerative change and spinal stenosis throughout the lumbar spine as above. Electronically Signed   By: Franchot Gallo M.D.   On: 06/22/2019 15:55   Pelvis Portable  Result Date: 06/22/2019 CLINICAL DATA:  Postop left femur EXAM: PORTABLE PELVIS 1-2 VIEWS COMPARISON:  06/21/2019 FINDINGS: Internal fixation across the left femoral intertrochanteric fracture. Lesser trochanter remains displaced, otherwise anatomic alignment. No visible hardware complicating feature. IMPRESSION: Internal fixation across the left intertrochanteric fracture as above. Electronically Signed   By: Rolm Baptise M.D.   On: 06/22/2019 11:32   Chest Portable 1 View  Result Date: 06/22/2019 CLINICAL DATA:  Preop evaluation for upcoming hip surgery EXAM: PORTABLE CHEST 1 VIEW COMPARISON:  03/19/2019 FINDINGS: Cardiac shadow is at the upper limits of normal in size. Aortic calcifications are noted. The lungs are well aerated bilaterally. No acute bony abnormality is seen. Healing rib fractures are noted bilaterally. IMPRESSION: No acute abnormality noted. Electronically Signed   By: Inez Catalina M.D.   On: 06/22/2019 08:01   Dg Knee Complete 4 Views Left  Result Date: 06/22/2019 CLINICAL DATA:  Left hip fracture EXAM: LEFT KNEE - COMPLETE 4+ VIEW COMPARISON:  None. FINDINGS: Moderate left knee joint effusion. No acute fracture or traumatic malalignment. Mild tricompartmental degenerative changes with bilateral meniscal chondrocalcinosis. Additional mineralization along the lateral joint line demonstrates a in arcs and whorled chondroid matrix which may reflect an intra-articular body which could be sequela of synovial chondromatosis or prior injury versus other heterotopic ossification. Extensive  atherosclerotic calcification. Remaining soft tissues are unremarkable. IMPRESSION: 1. Mild tricompartmental degenerative changes. No acute osseous abnormality. 2. Moderate left knee joint effusion. 3. Nonspecific mineralization along the lateral joint line, synovial chondromatosis versus posttraumatic loose body versus heterotopic calcification Electronically Signed   By: Lovena Le M.D.   On: 06/22/2019 19:59   Dg C-arm 1-60 Min-no Report  Result Date: 06/22/2019 Fluoroscopy was utilized by the requesting physician.  No radiographic interpretation.   Dg Hip Operative Unilat W Or W/o Pelvis Left  Result Date: 06/22/2019 CLINICAL DATA:  Femur fracture EXAM: OPERATIVE left HIP (WITH PELVIS IF PERFORMED) 2 VIEWS TECHNIQUE: Fluoroscopic spot image(s) were submitted for interpretation post-operatively. COMPARISON:  06/21/2019 FINDINGS: Two low resolution intraoperative spot views of the left hip. Intramedullary rod and screw fixation of comminuted left intertrochanteric fracture with slightly displaced lesser trochanteric fracture fragment. Total fluoroscopy time was 48 seconds IMPRESSION: Intraoperative fluoroscopic assistance provided during surgical fixation of left femur fracture Electronically Signed   By: Donavan Foil M.D.   On: 06/22/2019 15:00   Dg Hip Unilat With Pelvis 2-3 Views Left  Result Date: 06/21/2019 CLINICAL DATA:  85 year old who fell earlier this evening and was found by her family on the floor at home. LEFT hip injury with pain. Initial encounter. EXAM: DG HIP (WITH OR WITHOUT PELVIS) 2-3V LEFT COMPARISON:  04/02/2018. FINDINGS: Comminuted intertrochanteric LEFT femoral neck fracture into multiple parts. Hip joint anatomically aligned with severe SUPERIOR joint space narrowing. Included AP pelvis demonstrates no fractures elsewhere. Sacroiliac joints and symphysis pubis anatomically aligned. Severe joint space narrowing involving the contralateral RIGHT hip. IMPRESSION: 1.  Acute  traumatic comminuted intertrochanteric LEFT femoral neck fracture. 2. Anatomic alignment the LEFT hip joint with severe SUPERIOR joint space narrowing. 3. Severe osteoarthritis involving the contralateral RIGHT hip. Electronically Signed   By: Evangeline Dakin M.D.   On: 06/21/2019 18:46   Dg Femur Min 2 Views Left  Result Date: 06/23/2019 CLINICAL DATA:  Fall.  Left hip fracture EXAM: Left femur-two view: COMPARISON:  Hip series performed earlier today FINDINGS: The previously described left femoral intertrochanteric fracture with comminution and varus angulation again noted. No additional bony abnormality within the left femur. The left kidney is not completely visualized. IMPRESSION: Comminuted left proximal femoral intertrochanteric fracture with varus angulation. Electronically Signed   By: Rolm Baptise M.D.   On: 06/22/2019 16:12       Subjective: Diarrhea resolved after antibiotic was discontinued yesterday.  Discharge Exam: Vitals:   06/26/19 2014 06/27/19 0606  BP: 116/68 140/88  Pulse: 94 94  Resp: 16 16  Temp: 98.3 F (36.8 C) 97.7 F (36.5 C)  SpO2: 95% 100%   Vitals:   06/26/19 1215 06/26/19 1325 06/26/19 2014 06/27/19 0606  BP: 94/62 106/63 116/68 140/88  Pulse: 70  94 94  Resp: 20  16 16   Temp: 97.8 F (36.6 C)  98.3 F (36.8 C) 97.7 F (36.5 C)  TempSrc: Oral  Oral Oral  SpO2: 94%  95% 100%  Weight:      Height:        General: Sleepy but arousable, not in distress HEENT: Moist mucosa, supple neck Chest: Clear CVS: Normal S1 and S2 GI: Soft, nondistended, nontender Musculoskeletal: Warm, clean dressing over left hip    The results of significant diagnostics from this hospitalization (including imaging, microbiology, ancillary and laboratory) are listed below for reference.     Microbiology: Recent Results (from the past 240 hour(s))  Urine culture     Status: Abnormal   Collection Time: 06/21/19  6:04 PM   Specimen: Urine, Clean Catch  Result  Value Ref Range Status   Specimen Description   Final    URINE, CLEAN CATCH Performed at [facility], [place] [address] [place], [place] [address]    Special Requests   Final    NONE Performed at [facility], [place] [address], [place], [place] [address]    Culture >=100,000 COLONIES/mL ESCHERICHIA COLI (A)  Final   Report Status 06/24/2019 FINAL  Final   Organism ID, Bacteria ESCHERICHIA COLI (A)  Final      Susceptibility   Escherichia coli - MIC*    AMPICILLIN 4 SENSITIVE Sensitive     CEFAZOLIN <=4 SENSITIVE Sensitive     CEFTRIAXONE <=1 SENSITIVE Sensitive     CIPROFLOXACIN <=0.25 SENSITIVE Sensitive     GENTAMICIN <=1 SENSITIVE Sensitive     IMIPENEM <=0.25 SENSITIVE Sensitive     NITROFURANTOIN 32 SENSITIVE Sensitive     TRIMETH/SULFA <=20 SENSITIVE Sensitive     AMPICILLIN/SULBACTAM <=2 SENSITIVE Sensitive     PIP/TAZO <=4 SENSITIVE Sensitive     Extended ESBL NEGATIVE Sensitive     * >=100,000 COLONIES/mL ESCHERICHIA COLI  SARS Coronavirus 2 [facility] order, Performed in [facility] hospital lab) Nasopharyngeal Nasopharyngeal Swab     Status: None   Collection Time: 06/21/19  6:05 PM   Specimen: Nasopharyngeal Swab  Result Value Ref Range Status   SARS Coronavirus 2 NEGATIVE NEGATIVE Final    Comment: (NOTE) If result is NEGATIVE SARS-CoV-2 target nucleic acids are NOT DETECTED. The SARS-CoV-2  RNA is generally detectable in upper and lower  respiratory specimens during the acute phase of infection. The lowest  concentration of SARS-CoV-2 viral copies this assay can detect is 250  copies / mL. A negative result does not preclude SARS-CoV-2 infection  and should not be used as the sole basis for treatment or other  patient management decisions.  A negative result may occur with  improper specimen collection / handling, submission of specimen other  than nasopharyngeal swab, presence of viral mutation(s) within the  areas targeted by  this assay, and inadequate number of viral copies  (<250 copies / mL). A negative result must be combined with clinical  observations, patient history, and epidemiological information. If result is POSITIVE SARS-CoV-2 target nucleic acids are DETECTED. The SARS-CoV-2 RNA is generally detectable in upper and lower  respiratory specimens dur ing the acute phase of infection.  Positive  results are indicative of active infection with SARS-CoV-2.  Clinical  correlation with patient history and other diagnostic information is  necessary to determine patient infection status.  Positive results do  not rule out bacterial infection or co-infection with other viruses. If result is PRESUMPTIVE POSTIVE SARS-CoV-2 nucleic acids MAY BE PRESENT.   A presumptive positive result was obtained on the submitted specimen  and confirmed on repeat testing.  While 2019 novel coronavirus  (SARS-CoV-2) nucleic acids may be present in the submitted sample  additional confirmatory testing may be necessary for epidemiological  and / or clinical management purposes  to differentiate between  SARS-CoV-2 and other Sarbecovirus currently known to infect humans.  If clinically indicated additional testing with an alternate test  methodology [phone]) is advised. The SARS-CoV-2 RNA is generally  detectable in upper and lower respiratory sp ecimens during the acute  phase of infection. The expected result is Negative. Fact Sheet for Patients:  StrictlyIdeas.no Fact Sheet for Healthcare Providers: BankingDealers.co.za This test is not yet approved or cleared by the [place] FDA and has been authorized for detection and/or diagnosis of SARS-CoV-2 by FDA under an Emergency Use Authorization (EUA).  This EUA will remain in effect (meaning this test can be used) for the duration of the COVID-19 declaration under Section 564(b)(1) of the Act, 21 U.S.C. section  360bbb-3(b)(1), unless the authorization is terminated or revoked sooner. Performed at [facility] [place] [address], [place], [place] [address]      Labs: BNP (last 3 results) No results for input(s): BNP in the last 8760 hours. Basic Metabolic Panel: Recent Labs  Lab 06/21/19 1911 06/22/19 1149 06/23/19 0429 06/24/19 0425  NA 138  --  140 140  K 3.9  --  3.4* 3.7  CL 104  --  103 106  CO2 22  --  21* 23  GLUCOSE 145*  --  111* 105*  BUN 22  --  32* 32*  CREATININE 0.74 1.03* 1.10* 0.86  CALCIUM 8.6*  --  8.4* 8.0*  MG 1.5*  --   --   --   PHOS 3.0  --   --   --    Liver Function Tests: Recent Labs  Lab 06/21/19 1911  AST 24  ALT 14  ALKPHOS 44  BILITOT 1.0  PROT 5.9*  ALBUMIN 3.7   No results for input(s): LIPASE, AMYLASE in the last 168 hours. No results for input(s): AMMONIA in the last 168 hours. CBC: Recent Labs  Lab 06/21/19 1911  06/23/19 0429 06/24/19 0425 06/24/19 1655 06/25/19 0550 06/26/19 0504  WBC  13.7*   < > 16.0* 9.5 9.3 10.0 10.9*  NEUTROABS 11.8*  --   --   --   --   --   --   HGB 12.1   < > 8.0* 6.9* 9.1* 9.4* 9.5*  HCT 36.4   < > 24.2* 21.6* 28.2* 28.6* 28.8*  MCV 99.7   < > 100.0 104.3* 99.6 97.9 97.6  PLT 142*   < > 110* 80* 78* 99* PLATELET CLUMPS NOTED ON SMEAR, UNABLE TO ESTIMATE   < > = values in this interval not displayed.   Cardiac Enzymes: Recent Labs  Lab 06/21/19 1911  CKTOTAL 39   BNP: Invalid input(s): POCBNP CBG: Recent Labs  Lab 06/22/19 1140  GLUCAP 133*   D-Dimer No results for input(s): DDIMER in the last 72 hours. Hgb A1c No results for input(s): HGBA1C in the last 72 hours. Lipid Profile No results for input(s): CHOL, HDL, LDLCALC, TRIG, CHOLHDL, LDLDIRECT in the last 72 hours. Thyroid function studies No results for input(s): TSH, T4TOTAL, T3FREE, THYROIDAB in the last 72 hours.  Invalid input(s): FREET3 Anemia work up No results for input(s): VITAMINB12, FOLATE, FERRITIN,  TIBC, IRON, RETICCTPCT in the last 72 hours. Urinalysis    Component Value Date/Time   COLORURINE AMBER (A) 06/21/2019 1804   APPEARANCEUR CLOUDY (A) 06/21/2019 1804   LABSPEC 1.018 06/21/2019 1804   PHURINE 7.0 06/21/2019 1804   GLUCOSEU NEGATIVE 06/21/2019 1804   HGBUR NEGATIVE 06/21/2019 1804   HGBUR trace-lysed 12/12/2010 1509   BILIRUBINUR NEGATIVE 06/21/2019 1804   BILIRUBINUR Negative 01/30/2019 1155   KETONESUR 5 (A) 06/21/2019 1804   PROTEINUR 100 (A) 06/21/2019 1804   UROBILINOGEN 0.2 01/30/2019 1155   UROBILINOGEN 0.2 12/12/2010 1509   NITRITE NEGATIVE 06/21/2019 1804   LEUKOCYTESUR LARGE (A) 06/21/2019 1804   Sepsis Labs Invalid input(s): PROCALCITONIN,  WBC,  LACTICIDVEN Microbiology Recent Results (from the past 240 hour(s))  Urine culture     Status: Abnormal   Collection Time: 06/21/19  6:04 PM   Specimen: Urine, Clean Catch  Result Value Ref Range Status   Specimen Description   Final    URINE, CLEAN CATCH Performed at [facility] [facility], [place] [address] [place],  [address]    Special Requests   Final    NONE Performed at [facility], [place] [address] [place], [place] [address]    Culture >=100,000 COLONIES/mL ESCHERICHIA COLI (A)  Final   Report Status 06/24/2019 FINAL  Final   Organism ID, Bacteria ESCHERICHIA COLI (A)  Final      Susceptibility   Escherichia coli - MIC*    AMPICILLIN 4 SENSITIVE Sensitive     CEFAZOLIN <=4 SENSITIVE Sensitive     CEFTRIAXONE <=1 SENSITIVE Sensitive     CIPROFLOXACIN <=0.25 SENSITIVE Sensitive     GENTAMICIN <=1 SENSITIVE Sensitive     IMIPENEM <=0.25 SENSITIVE Sensitive     NITROFURANTOIN 32 SENSITIVE Sensitive     TRIMETH/SULFA <=20 SENSITIVE Sensitive     AMPICILLIN/SULBACTAM <=2 SENSITIVE Sensitive     PIP/TAZO <=4 SENSITIVE Sensitive     Extended ESBL NEGATIVE Sensitive     * >=100,000 COLONIES/mL ESCHERICHIA COLI  SARS Coronavirus 2 [facility] order, [facility] lab) Nasopharyngeal Nasopharyngeal Swab     Status: None   Collection Time: 06/21/19  6:05 PM   Specimen: Nasopharyngeal Swab  Result Value Ref Range Status   SARS Coronavirus 2 NEGATIVE NEGATIVE Final    Comment: (NOTE) If  result is NEGATIVE SARS-CoV-2 target nucleic acids are NOT DETECTED. The SARS-CoV-2 RNA is generally detectable in upper and lower  respiratory specimens during the acute phase of infection. The lowest  concentration of SARS-CoV-2 viral copies this assay can detect is 250  copies / mL. A negative result does not preclude SARS-CoV-2 infection  and should not be used as the sole basis for treatment or other  patient management decisions.  A negative result may occur with  improper specimen collection / handling, submission of specimen other  than nasopharyngeal swab, presence of viral mutation(s) within the  areas targeted by this assay, and inadequate number of viral copies  (<250 copies / mL). A negative result must be combined with clinical  observations, patient history, and epidemiological information. If result is POSITIVE SARS-CoV-2 target nucleic acids are DETECTED. The SARS-CoV-2 RNA is generally detectable in upper and lower  respiratory specimens dur ing the acute phase of infection.  Positive  results are indicative of active infection with SARS-CoV-2.  Clinical  correlation with patient history and other diagnostic information is  necessary to determine patient infection status.  Positive results do  not rule out bacterial infection or co-infection with other viruses. If result is PRESUMPTIVE POSTIVE SARS-CoV-2 nucleic acids MAY BE PRESENT.   A presumptive positive result was obtained on the submitted specimen  and confirmed on repeat testing.  While 2019 novel coronavirus  (SARS-CoV-2) nucleic acids may be present in the submitted sample  additional confirmatory testing may be necessary for epidemiological  and / or clinical management  purposes  to differentiate between  SARS-CoV-2 and other Sarbecovirus currently known to infect humans.  If clinically indicated additional testing with an alternate test  methodology [phone]) is advised. The SARS-CoV-2 RNA is generally  detectable in upper and lower respiratory sp ecimens during the acute  phase of infection. The expected result is Negative. Fact Sheet for Patients:  StrictlyIdeas.no Fact Sheet for Healthcare Providers: BankingDealers.co.za This test is not yet approved or cleared by the [place] FDA and has been authorized for detection and/or diagnosis of SARS-CoV-2 by FDA under an Emergency Use Authorization (EUA).  This EUA will remain in effect (meaning this test can be used) for the duration of the COVID-19 declaration under Section 564(b)(1) of the Act, 21 U.S.C. section 360bbb-3(b)(1), unless the authorization is terminated or revoked sooner. Performed at [facility], [place] [address], [place], [place] [address]      Time coordinating discharge: 35 minutes  SIGNED:   Louellen Molder, MD  [facility] 06/27/2019, 10:16 AM Pager   If 7PM-7AM, please contact night-coverage [email or web address] Password [email or web address]

## 2019-06-27 NOTE — PMR Pre-admission (Signed)
PMR Admission Coordinator Pre-Admission Assessment  Patient: Michelle Gregory is an 85 y.o., female MRN: [id number] DOB: Apr 29, 1923 Height: 5' (152.4 cm) Weight: 54.4 kg  Insurance Information HMO:     PPO:      PCP:      IPA:      80/20:      OTHER:  PRIMARY: Medicare [place]   A and b   Policy#: [id number]      Subscriber: pt Pre-Cert#: no Auth required Benefits:  Phone #: [phone]     Name: 8/25 no Auth required; treat as Medicare Traditional Eff. Date: 01/31/1990    Deduct: $1408      Out of Pocket Max: none      Life Max: none CIR: 100%      SNF: 20 full days Outpatient: 80%     Co-Pay: 20% Home Health: 100%      Co-Pay: none DME: 80%     Co-Pay: 20% Providers: pt choice  SECONDARY: [employer]      Policy#: [id number]      Subscriber: pt supplement policy Plan F [id number]  Medicaid Application Date:       Case Manager:  Disability Application Date:       Case Worker:   The "Data Collection Information Summary" for patients in Inpatient Rehabilitation Facilities with attached "Privacy Act [place] Records" was provided and verbally reviewed with: Family  Emergency Contact Information Contact Information    Name Relation Home Work [place] Daughter [phone]   Posey Pronto [phone]        Current Medical History  Patient Admitting Diagnosis: Hip fracture  History of Present Illness: 85 year old female with medical history of allergic rhinitis, anxiety, depression, C. Diff, GERD, hyperlipidemia, colon cancer, recurrent UTIs, HTN, osteoarthritis, and compression fractures with chronic pain. Presented 06/21/2019 via EMS after fall at home. Found to have left intertrochanteric femur fracture.   Dr. Lyla Glassing took to OR on 8/20 with IM nailing. Placed on Lovenox for DVT prophylaxis.  Postsurgical blood loss in Hgb to 6.9 with baseline of 12. Transfused 1 unit PRBCs on 8/22.   Hypotension and oliguria with BP meds  held and given IV fluids with improvement. BP meds resumed.  Diarrhea noted felt due to antibiotic use. Antibiotic D/C'd. E. Coli UTI pansensitive and 5 days of Rocephin completed. Gout treated with allopurinol. Some delirium noted 8/24 felt due to sundowning and associated infection and pain. Slowly resolving.      Patient's medical record from [facility] has been reviewed by the rehabilitation admission coordinator and physician.  Past Medical History  Past Medical History:  Diagnosis Date  . Allergic rhinitis   . Anxiety   . C. difficile diarrhea 10/2017  . Cancer ([place])   . Depression   . GERD (gastroesophageal reflux disease)   . History of colon cancer   . History of recurrent UTIs   . Hyperlipidemia   . Hypertension   . Osteoarthritis   . Spinal compression fracture ([place])   . Vertigo     Family History   family history includes Gout in her mother; Heart attack in her mother; Prostate cancer in her father.  Prior Rehab/Hospitalizations Has the patient had prior rehab or hospitalizations prior to admission? Yes   Has been to [facility] 3 times over the years  Has the patient had major surgery during 100 days prior to admission? Yes   Current Medications  Current Facility-Administered Medications:  .  allopurinol (ZYLOPRIM) tablet 100 mg, 100 mg, Oral, BID, Swinteck, Aaron Edelman, MD, 100 mg at 06/26/19 2017 .  ALPRAZolam Duanne Moron) tablet 0.25-0.5 mg, 0.25-0.5 mg, Oral, BID PRN, Schorr, Rhetta Mura, NP, 0.5 mg at 06/26/19 2017 .  aspirin EC tablet 81 mg, 81 mg, Oral, Daily, Swinteck, Aaron Edelman, MD, 81 mg at 06/26/19 0935 .  docusate sodium (COLACE) capsule 100 mg, 100 mg, Oral, BID, Swinteck, Aaron Edelman, MD, 100 mg at 06/26/19 2018 .  enoxaparin (LOVENOX) injection 30 mg, 30 mg, Subcutaneous, Q24H, Dhungel, Nishant, MD, 30 mg at 06/26/19 0949 .  gabapentin (NEURONTIN) capsule 300 mg, 300 mg, Oral, BID, Swinteck, Aaron Edelman, MD, 300 mg at 06/26/19 2017 .  haloperidol lactate  (HALDOL) injection 2 mg, 2 mg, Intravenous, Once, Blount, Lolita Cram, NP, Stopped at 06/26/19 [phone] .  HYDROcodone-acetaminophen (NORCO) 10-325 MG per tablet 1 tablet, 1 tablet, Oral, TID PRN, Rod Can, MD, 1 tablet at 06/26/19 0935 .  HYDROmorphone (DILAUDID) injection 0.5 mg, 0.5 mg, Intravenous, Q2H PRN, Rod Can, MD, 0.5 mg at 06/24/19 1409 .  losartan (COZAAR) tablet 100 mg, 100 mg, Oral, Daily, Dhungel, Nishant, MD, 100 mg at 06/26/19 0936 .  meclizine (ANTIVERT) tablet 25 mg, 25 mg, Oral, TID PRN, Swinteck, Aaron Edelman, MD .  menthol-cetylpyridinium (CEPACOL) lozenge 3 mg, 1 lozenge, Oral, PRN **OR** phenol (CHLORASEPTIC) mouth spray 1 spray, 1 spray, Mouth/Throat, PRN, Swinteck, Aaron Edelman, MD .  metoprolol tartrate (LOPRESSOR) tablet 50 mg, 50 mg, Oral, BID, Dhungel, Nishant, MD, 50 mg at 06/26/19 2017 .  ondansetron (ZOFRAN) tablet 4 mg, 4 mg, Oral, Q6H PRN, 4 mg at 06/25/19 0851 **OR** ondansetron (ZOFRAN) injection 4 mg, 4 mg, Intravenous, Q6H PRN, Rod Can, MD, 4 mg at 06/23/19 1002 .  senna (SENOKOT) tablet 8.6 mg, 1 tablet, Oral, BID, Swinteck, Aaron Edelman, MD, 8.6 mg at 06/26/19 2018  Patients Current Diet:  Diet Order            Diet regular Room service appropriate? Yes; Fluid consistency: Thin  Diet effective now              Precautions / Restrictions Precautions Precautions: Fall Restrictions Weight Bearing Restrictions: Yes LLE Weight Bearing: Weight bearing as tolerated Other Position/Activity Restrictions: WBAT LLE   Has the patient had 2 or more falls or a fall with injury in the past year? Yes  Prior Activity Level Community (5-7x/wk): goes to daughter's home next door daily; does not drive  Prior Functional Level Self Care: Did the patient need help bathing, dressing, using the toilet or eating? Needed some help daughter assisted with tub transfers  Indoor Mobility: Did the patient need assistance with walking from room to room (with or without device)?  Independent  Stairs: Did the patient need assistance with internal or external stairs (with or without device)? Needed some help  Functional Cognition: Did the patient need help planning regular tasks such as shopping or remembering to take medications? Needed some help  Home Assistive Devices / Equipment Home Assistive Devices/Equipment: Other (Comment) Home Equipment: Cane - single point, Walker - 2 wheels, Walker - 4 wheels, Shower seat, Grab bars - tub/shower, Grab bars - toilet  Prior Device Use: Indicate devices/aids used by the patient prior to current illness, exacerbation or injury? no AD  Current Functional Level Cognition  Overall Cognitive Status: History of cognitive impairments - at baseline Orientation Level: Oriented to person, Disoriented to place, Disoriented to time, Disoriented to situation General Comments: appropriately conversational (hx of dementia per chart); pleasant and follows commands  Extremity Assessment (includes Sensation/Coordination)  Upper Extremity Assessment: Generalized weakness  Lower Extremity Assessment: Generalized weakness, RLE deficits/detail RLE Deficits / Details: required assist for any R LE movement and repositioning RLE: Unable to fully assess due to pain    ADLs  Overall ADL's : Needs assistance/impaired Eating/Feeding: Set up, Sitting Grooming: Set up, Minimal assistance, Sitting Upper Body Bathing: Maximal assistance, Bed level Lower Body Bathing: Total assistance, +2 for physical assistance Upper Body Dressing : Maximal assistance, Bed level Lower Body Dressing: Total assistance, +2 for physical assistance, Bed level General ADL Comments: Partially sat EOB. Unable to bear weight onto LLE. Returned to supine d/t pain.    Mobility  Overal bed mobility: Needs Assistance Bed Mobility: Supine to Sit Supine to sit: Mod assist Sit to supine: Total assist, +2 for physical assistance General bed mobility comments: cues for self  assist, pt able to move R LE however assist provided L LE due to pain, pt assisted with trunk upright however required assist to scoot to EOB so utilized bed pad, pt continues to lean to right side to avoid sitting on left hip    Transfers  Overall transfer level: Needs assistance Equipment used: Rolling walker (2 wheeled) Transfers: Stand Pivot Transfers, Sit to/from Stand Sit to Stand: Max assist, From elevated surface, +2 physical assistance Stand pivot transfers: Max assist, +2 physical assistance General transfer comment: verbal cues for hand placement, pt with posterior lean upon standing requiring assist and cues to correct; also increased cues to take smalls steps for pivoting transfers; pt assisted to [facility] and then to recliner    Ambulation / Gait / Stairs / Wheelchair Mobility  Ambulation/Gait General Gait Details: deferred due to assist level and pt fatigue after using BSC    Posture / Balance Balance Overall balance assessment: Needs assistance, History of Falls Standing balance support: Bilateral upper extremity supported Standing balance-Leahy Scale: Zero Standing balance comment: requiring UE support and max physical external support    Special needs/care consideration BiPAP/CPAP n/a CPM  N/a Continuous Drip IV  N/a Dialysis n/a Life Vest  N/a Oxygen  N/a Special Bed fall prevention Trach Size  N/a Wound Vac n/a Skin surgical incision; left elbow skin tear Bowel mgmt:  incontinent LBM 8/24; noted diarrhea from Rocephin usage presumed Bladder mgmt: external catheter; treatment for UTI on acute hospital  Diabetic mgmt: n/a Behavioral consideration mild dementia at baseline; increased confusion at night since hospitalized; treatment for UTI since admit. Takes Xanax at hs every 2 to 3 months for sleep per daughter pta. Take Norco 3 to 4 times per day pta for chronic back pain Chemo/radiation  N/a Visitor designee is daughter, [place]: (satys at her home 1130 am until 6 pm daily; othersie at her )  Lives With: Daughter Available Help at Discharge: Family, Available 24 hours/day(daughter can provide 24/7 supervision at her home; next door) Type of Home: House Home Layout: One level Home Access: Stairs to enter Entrance Stairs-Rails: Right, Left, Can reach both Entrance Stairs-Number of Steps: 5 Bathroom Shower/Tub: Chiropodist: Handicapped height Bathroom Accessibility: Yes How Accessible: Accessible via walker [place]: No  Stays at daughter's home next door except for 1130 am until 6 pm goes to her own home and was I without AD  Discharge Living Setting Plans for Discharge Living Setting: Lives with (comment)(to stay 24/7 at her daughter's home at d/c) Type of Home at Discharge: House Discharge Home Layout:  One level Discharge Home Access: Stairs to enter Entrance Stairs-Rails: None Entrance Stairs-Number of Steps: 2 Discharge Bathroom Shower/Tub: Tub/shower unit, Curtain Discharge Bathroom Toilet: Standard(son in Sports coach to install handicapped height toilet) Discharge Bathroom Accessibility: Yes How Accessible: Accessible via walker Does the patient have any problems obtaining your medications?: No   To stay at her daughter's home at d/c where daughter and son in law will provide 24/7 assist  Social/Family/Support Systems Patient Roles: Parent Contact Information: [name], daughter Anticipated Caregiver: daughter and son in law Anticipated Caregiver's Contact Information: cell [phone] Ability/Limitations of Caregiver: daughter has no limitations Caregiver Availability: 24/7 Discharge Plan Discussed with Primary Caregiver: Yes Is Caregiver In Agreement with Plan?: Yes Does Caregiver/Family have Issues with Lodging/Transportation while Pt is in Rehab?: No  Goals/Additional Needs Patient/Family Goal for Rehab: supervision PT, supervision to min OT,  supervision SLP Expected length of stay: ELOS 10 to 14 days Special Service Needs: Patietn with mild baseline dementia at baseline; worse since hospitalized; UTI and worse at bedtime Pt/Family Agrees to Admission and willing to participate: Yes Program Orientation Provided & Reviewed with Pt/Caregiver Including Roles  & Responsibilities: Yes  Decrease burden of Care through IP rehab admission: n/a  Possible need for SNF placement upon discharge: not anticipated; has been to [facility] 3 times in past. Daughter does not want this due to Covid issues and pt's baseline dementia and lack of visiting hours  Patient Condition: I have reviewed medical records from [facility] , spoken with CM, and daughter. I discussed via phone for inpatient rehabilitation assessment.  Patient will benefit from ongoing PT, OT and SLP, can actively participate in 3 hours of therapy a day 5 days of the week, and can make measurable gains during the admission.  Patient will also benefit from the coordinated team approach during an Inpatient Acute Rehabilitation admission.  The patient will receive intensive therapy as well as Rehabilitation physician, nursing, social worker, and care management interventions.  Due to bladder management, bowel management, safety, skin/wound care, disease management, medication administration, pain management and patient education the patient requires 24 hour a day rehabilitation nursing.  The patient is currently mod assist with mobility and basic ADLs.  Discharge setting and therapy post discharge at home with home health is anticipated.  Patient has agreed to participate in the Acute Inpatient Rehabilitation Program and will admit today.  Preadmission Screen Completed By:  Cleatrice Burke, 06/27/2019 10:01 AM ______________________________________________________________________   Discussed status with Dr. Naaman [name]  on  06/27/2019 at  21 and received approval for admission  today.  Admission Coordinator:  Cleatrice Burke, RN MSN, time  1030 Date  06/27/2019   Assessment/Plan: Diagnosis: left IT hip fx 1. Does the need for close, 24 hr/day Medical supervision in concert with the patient's rehab needs make it unreasonable for this patient to be served in a less intensive setting? Yes 2. Co-Morbidities requiring supervision/potential complications: HTN, oliguria, diarrhea, UTI 3. Due to bladder management, bowel management, safety, skin/wound care, disease management, medication administration, pain management and patient education, does the patient require 24 hr/day rehab nursing? Yes 4. Does the patient require coordinated care of a physician, rehab nurse, PT (1-2 hrs/day, 5 days/week), OT (1-2 hrs/day, 5 days/week) and SLP (1-2 hrs/day, 5 days/week) to address physical and functional deficits in the context of the above medical diagnosis(es)? Yes Addressing deficits in the following areas: balance, endurance, locomotion, strength, transferring, bowel/bladder control, bathing, dressing, feeding, grooming, toileting, cognition and psychosocial support 5.  Can the patient actively participate in an intensive therapy program of at least 3 hrs of therapy 5 days a week? Yes 6. The potential for patient to make measurable gains while on inpatient rehab is excellent 7. Anticipated functional outcomes upon discharge from inpatients are: supervision PT, supervision and min assist OT, supervision SLP 8. Estimated rehab length of stay to reach the above functional goals is: 10-14 days 9. Anticipated D/C setting: Home 10. Anticipated post D/C treatments: [place] therapy 11. Overall Rehab/Functional Prognosis: excellent  MD Signature: [name], MD, [facility] 06/27/2019

## 2019-06-27 NOTE — Progress Notes (Signed)
Michelle Staggers, MD  Physician  Physical Medicine and Rehabilitation  PMR Pre-admission  Signed  Date of Service:  06/27/2019 10:01 AM      Related encounter: ED to Hosp-Admission (Discharged) from 06/21/2019 in [place]      Signed         Show:Clear all [x] Manual[x] Template[] Copied  Added by: [x] Rakiyah Esch, Vertis Kelch, RN[x] Michelle Staggers, MD  [] Hover for details PMR Admission Coordinator Pre-Admission Assessment  Patient: Michelle Gregory is an 85 y.o., female MRN: [id number] DOB: 08-21-1923 Height: 5' (152.4 cm) Weight: 54.4 kg  Insurance Information HMO:     PPO:      PCP:      IPA:      80/20:      OTHER:  PRIMARY: [employer] [place]   A and b   Policy#: [id number]      Subscriber: pt Pre-Cert#: no Auth required Benefits:  Phone #: [phone]     Name: 8/25 no Auth required; treat as Medicare Traditional Eff. Date: 01/31/1990    Deduct: $1408      Out of Pocket Max: none      Life Max: none CIR: 100%      SNF: 20 full days Outpatient: 80%     Co-Pay: 20% Home Health: 100%      Co-Pay: none DME: 80%     Co-Pay: 20% Providers: pt choice  SECONDARY: [employer]      Policy#: [id number]      Subscriber: pt supplement policy Plan F [id number]  Medicaid Application Date:       Case Manager:  Disability Application Date:       Case Worker:   The "Data Collection Information Summary" for patients in Inpatient Rehabilitation Facilities with attached "Privacy Act [place] Records" was provided and verbally reviewed with: Family  Emergency Contact Information         Contact Information    Name Relation Home Work [place] Daughter [phone]   Posey Pronto [phone]        Current Medical History  Patient Admitting Diagnosis: Hip fracture  History of Present Illness: 85 year old female with medical history of allergic rhinitis, anxiety,  depression, C. Diff, GERD, hyperlipidemia, colon cancer, recurrent UTIs, HTN, osteoarthritis, and compression fractures with chronic pain. Presented 06/21/2019 via EMS after fall at home. Found to have left intertrochanteric femur fracture.   Dr. Lyla Glassing took to OR on 8/20 with IM nailing. Placed on Lovenox for DVT prophylaxis.  Postsurgical blood loss in Hgb to 6.9 with baseline of 12. Transfused 1 unit PRBCs on 8/22.   Hypotension and oliguria with BP meds held and given IV fluids with improvement. BP meds resumed.  Diarrhea noted felt due to antibiotic use. Antibiotic D/C'd. E. Coli UTI pansensitive and 5 days of Rocephin completed. Gout treated with allopurinol. Some delirium noted 8/24 felt due to sundowning and associated infection and pain. Slowly resolving.      Patient's medical record from [facility] has been reviewed by the rehabilitation admission coordinator and physician.  Past Medical History      Past Medical History:  Diagnosis Date  . Allergic rhinitis   . Anxiety   . C. difficile diarrhea 10/2017  . Cancer ([place])   . Depression   . GERD (gastroesophageal reflux disease)   . History of colon cancer   . History of recurrent UTIs   . Hyperlipidemia   . Hypertension   .  Osteoarthritis   . Spinal compression fracture ([place])   . Vertigo     Family History   family history includes Gout in her mother; Heart attack in her mother; Prostate cancer in her father.  Prior Rehab/Hospitalizations Has the patient had prior rehab or hospitalizations prior to admission? Yes   Has been to [facility] 3 times over the years  Has the patient had major surgery during 100 days prior to admission? Yes             Current Medications  Current Facility-Administered Medications:  .  allopurinol (ZYLOPRIM) tablet 100 mg, 100 mg, Oral, BID, Swinteck, Aaron Edelman, MD, 100 mg at 06/26/19 2017 .  ALPRAZolam Duanne Moron) tablet 0.25-0.5 mg, 0.25-0.5 mg, Oral, BID  PRN, Schorr, Rhetta Mura, NP, 0.5 mg at 06/26/19 2017 .  aspirin EC tablet 81 mg, 81 mg, Oral, Daily, Swinteck, Aaron Edelman, MD, 81 mg at 06/26/19 0935 .  docusate sodium (COLACE) capsule 100 mg, 100 mg, Oral, BID, Swinteck, Aaron Edelman, MD, 100 mg at 06/26/19 2018 .  enoxaparin (LOVENOX) injection 30 mg, 30 mg, Subcutaneous, Q24H, Dhungel, Nishant, MD, 30 mg at 06/26/19 0949 .  gabapentin (NEURONTIN) capsule 300 mg, 300 mg, Oral, BID, Swinteck, Aaron Edelman, MD, 300 mg at 06/26/19 2017 .  haloperidol lactate (HALDOL) injection 2 mg, 2 mg, Intravenous, Once, Blount, Lolita Cram, NP, Stopped at 06/26/19 [phone] .  HYDROcodone-acetaminophen (NORCO) 10-325 MG per tablet 1 tablet, 1 tablet, Oral, TID PRN, Rod Can, MD, 1 tablet at 06/26/19 0935 .  HYDROmorphone (DILAUDID) injection 0.5 mg, 0.5 mg, Intravenous, Q2H PRN, Rod Can, MD, 0.5 mg at 06/24/19 1409 .  losartan (COZAAR) tablet 100 mg, 100 mg, Oral, Daily, Dhungel, Nishant, MD, 100 mg at 06/26/19 0936 .  meclizine (ANTIVERT) tablet 25 mg, 25 mg, Oral, TID PRN, Swinteck, Aaron Edelman, MD .  menthol-cetylpyridinium (CEPACOL) lozenge 3 mg, 1 lozenge, Oral, PRN **OR** phenol (CHLORASEPTIC) mouth spray 1 spray, 1 spray, Mouth/Throat, PRN, Swinteck, Aaron Edelman, MD .  metoprolol tartrate (LOPRESSOR) tablet 50 mg, 50 mg, Oral, BID, Dhungel, Nishant, MD, 50 mg at 06/26/19 2017 .  ondansetron (ZOFRAN) tablet 4 mg, 4 mg, Oral, Q6H PRN, 4 mg at 06/25/19 0851 **OR** ondansetron (ZOFRAN) injection 4 mg, 4 mg, Intravenous, Q6H PRN, Rod Can, MD, 4 mg at 06/23/19 1002 .  senna (SENOKOT) tablet 8.6 mg, 1 tablet, Oral, BID, Swinteck, Aaron Edelman, MD, 8.6 mg at 06/26/19 2018  Patients Current Diet:     Diet Order                  Diet regular Room service appropriate? Yes; Fluid consistency: Thin  Diet effective now               Precautions / Restrictions Precautions Precautions: Fall Restrictions Weight Bearing Restrictions: Yes LLE Weight Bearing: Weight bearing  as tolerated Other Position/Activity Restrictions: WBAT LLE   Has the patient had 2 or more falls or a fall with injury in the past year? Yes  Prior Activity Level Community (5-7x/wk): goes to daughter's home next door daily; does not drive  Prior Functional Level Self Care: Did the patient need help bathing, dressing, using the toilet or eating? Needed some help daughter assisted with tub transfers  Indoor Mobility: Did the patient need assistance with walking from room to room (with or without device)? Independent  Stairs: Did the patient need assistance with internal or external stairs (with or without device)? Needed some help  Functional Cognition: Did the patient need help planning regular  tasks such as shopping or remembering to take medications? Needed some help  Home Assistive Devices / Equipment Home Assistive Devices/Equipment: Other (Comment) Home Equipment: Cane - single point, Walker - 2 wheels, Walker - 4 wheels, Shower seat, Grab bars - tub/shower, Grab bars - toilet  Prior Device Use: Indicate devices/aids used by the patient prior to current illness, exacerbation or injury? no AD  Current Functional Level Cognition  Overall Cognitive Status: History of cognitive impairments - at baseline Orientation Level: Oriented to person, Disoriented to place, Disoriented to time, Disoriented to situation General Comments: appropriately conversational (hx of dementia per chart); pleasant and follows commands    Extremity Assessment (includes Sensation/Coordination)  Upper Extremity Assessment: Generalized weakness  Lower Extremity Assessment: Generalized weakness, RLE deficits/detail RLE Deficits / Details: required assist for any R LE movement and repositioning RLE: Unable to fully assess due to pain    ADLs  Overall ADL's : Needs assistance/impaired Eating/Feeding: Set up, Sitting Grooming: Set up, Minimal assistance, Sitting Upper Body Bathing: Maximal  assistance, Bed level Lower Body Bathing: Total assistance, +2 for physical assistance Upper Body Dressing : Maximal assistance, Bed level Lower Body Dressing: Total assistance, +2 for physical assistance, Bed level General ADL Comments: Partially sat EOB. Unable to bear weight onto LLE. Returned to supine d/t pain.    Mobility  Overal bed mobility: Needs Assistance Bed Mobility: Supine to Sit Supine to sit: Mod assist Sit to supine: Total assist, +2 for physical assistance General bed mobility comments: cues for self assist, pt able to move R LE however assist provided L LE due to pain, pt assisted with trunk upright however required assist to scoot to EOB so utilized bed pad, pt continues to lean to right side to avoid sitting on left hip    Transfers  Overall transfer level: Needs assistance Equipment used: Rolling walker (2 wheeled) Transfers: Stand Pivot Transfers, Sit to/from Stand Sit to Stand: Max assist, From elevated surface, +2 physical assistance Stand pivot transfers: Max assist, +2 physical assistance General transfer comment: verbal cues for hand placement, pt with posterior lean upon standing requiring assist and cues to correct; also increased cues to take smalls steps for pivoting transfers; pt assisted to [facility] and then to recliner    Ambulation / Gait / Stairs / Wheelchair Mobility  Ambulation/Gait General Gait Details: deferred due to assist level and pt fatigue after using BSC    Posture / Balance Balance Overall balance assessment: Needs assistance, History of Falls Standing balance support: Bilateral upper extremity supported Standing balance-Leahy Scale: Zero Standing balance comment: requiring UE support and max physical external support    Special needs/care consideration BiPAP/CPAP n/a CPM  N/a Continuous Drip IV  N/a Dialysis n/a Life Vest  N/a Oxygen  N/a Special Bed fall prevention Trach Size  N/a Wound Vac n/a Skin surgical incision; left  elbow skin tear Bowel mgmt:  incontinent LBM 8/24; noted diarrhea from Rocephin usage presumed Bladder mgmt: external catheter; treatment for UTI on acute hospital  Diabetic mgmt: n/a Behavioral consideration mild dementia at baseline; increased confusion at night since hospitalized; treatment for UTI since admit. Takes Xanax at hs every 2 to 3 months for sleep per daughter pta. Take Norco 3 to 4 times per day pta for chronic back pain Chemo/radiation  N/a Visitor designee is daughter, Jerome: (satys at her home 1130 am until 6 pm daily; othersie at her )  Lives With: Daughter Available Help at  Discharge: Family, Available 24 hours/day(daughter can provide 24/7 supervision at her home; next door) Type of Home: House Home Layout: One level Home Access: Stairs to enter Entrance Stairs-Rails: Right, Left, Can reach both Entrance Stairs-Number of Steps: 5 Bathroom Shower/Tub: Chiropodist: Handicapped height Bathroom Accessibility: Yes How Accessible: Accessible via walker [place]: No  Stays at daughter's home next door except for 1130 am until 6 pm goes to her own home and was I without AD  Discharge Living Setting Plans for Discharge Living Setting: Lives with (comment)(to stay 24/7 at her daughter's home at d/c) Type of Home at Discharge: House Discharge Home Layout: One level Discharge Home Access: Stairs to enter Entrance Stairs-Rails: None Entrance Stairs-Number of Steps: 2 Discharge Bathroom Shower/Tub: Tub/shower unit, Curtain Discharge Bathroom Toilet: Standard(son in Sports coach to install handicapped height toilet) Discharge Bathroom Accessibility: Yes How Accessible: Accessible via walker Does the patient have any problems obtaining your medications?: No   To stay at her daughter's home at d/c where daughter and son in law will provide 24/7 assist  [place] Patient  Roles: Parent Contact Information: [name], daughter Anticipated Caregiver: daughter and son in law Anticipated Caregiver's Contact Information: cell [phone] Ability/Limitations of Caregiver: daughter has no limitations Caregiver Availability: 24/7 Discharge Plan Discussed with Primary Caregiver: Yes Is Caregiver In Agreement with Plan?: Yes Does Caregiver/Family have Issues with Lodging/Transportation while Pt is in Rehab?: No  Goals/Additional Needs Patient/Family Goal for Rehab: supervision PT, supervision to min OT, supervision SLP Expected length of stay: ELOS 10 to 14 days Special Service Needs: Patietn with mild baseline dementia at baseline; worse since hospitalized; UTI and worse at bedtime Pt/Family Agrees to Admission and willing to participate: Yes Program Orientation Provided & Reviewed with Pt/Caregiver Including Roles  & Responsibilities: Yes  Decrease burden of Care through IP rehab admission: n/a  Possible need for SNF placement upon discharge: not anticipated; has been to [facility] 3 times in past. Daughter does not want this due to Covid issues and pt's baseline dementia and lack of visiting hours  Patient Condition: I have reviewed [facility] from [facility] , spoken with CM, and daughter. I discussed via phone for inpatient rehabilitation assessment.  Patient will benefit from ongoing PT, OT and SLP, can actively participate in 3 hours of therapy a day 5 days of the week, and can make measurable gains during the admission.  Patient will also benefit from the coordinated team approach during an Inpatient Acute Rehabilitation admission.  The patient will receive intensive therapy as well as Rehabilitation physician, nursing, social worker, and care management interventions.  Due to bladder management, bowel management, safety, skin/wound care, disease management, medication administration, pain management and patient education the patient requires  24 hour a day rehabilitation nursing.  The patient is currently mod assist with mobility and basic ADLs.  Discharge setting and therapy post discharge at home with home health is anticipated.  Patient has agreed to participate in the Acute Inpatient Rehabilitation Program and will admit today.  Preadmission Screen Completed By:  Cleatrice Burke, 06/27/2019 10:01 AM ______________________________________________________________________   Discussed status with Dr. Naaman [name]  on  06/27/2019 at  33 and received approval for admission today.  Admission Coordinator:  Cleatrice Burke, RN MSN, time  1030 Date  06/27/2019   Assessment/Plan: Diagnosis: left IT hip fx 1. Does the need for close, 24 hr/day Medical supervision in concert with the patient's rehab needs make it unreasonable for this patient  to be served in a less intensive setting? Yes 2. Co-Morbidities requiring supervision/potential complications: HTN, oliguria, diarrhea, UTI 3. Due to bladder management, bowel management, safety, skin/wound care, disease management, medication administration, pain management and patient education, does the patient require 24 hr/day rehab nursing? Yes 4. Does the patient require coordinated care of a physician, rehab nurse, PT (1-2 hrs/day, 5 days/week), OT (1-2 hrs/day, 5 days/week) and SLP (1-2 hrs/day, 5 days/week) to address physical and functional deficits in the context of the above medical diagnosis(es)? Yes Addressing deficits in the following areas: balance, endurance, locomotion, strength, transferring, bowel/bladder control, bathing, dressing, feeding, grooming, toileting, cognition and psychosocial support 5. Can the patient actively participate in an intensive therapy program of at least 3 hrs of therapy 5 days a week? Yes 6. The potential for patient to make measurable gains while on inpatient rehab is excellent 7. Anticipated functional outcomes upon discharge from inpatients are:  supervision PT, supervision and min assist OT, supervision SLP 8. Estimated rehab length of stay to reach the above functional goals is: 10-14 days 9. Anticipated D/C setting: Home 10. Anticipated post D/C treatments: [place] therapy 11. Overall Rehab/Functional Prognosis: excellent  MD Signature: Michelle Staggers, MD, [place] [facility] 06/27/2019         Revision History

## 2019-06-27 NOTE — Progress Notes (Signed)
Inpatient Rehabilitation Admissions Coordinator  Discussed with Dr. Naaman [name]. I can admit pt to inpt rehab today. I contacted pt's daughter, [name] , by phone and she is in agreement. I will contact Dr. Clementeen Graham to make the arrangements. I will alert RN CM, Myraette.  Danne Baxter, RN, MSN Rehab Admissions Coordinator [phone] 06/27/2019 9:37 AM

## 2019-06-27 NOTE — Progress Notes (Signed)
MEWs yellow, pt pulse was in 120s and fluctuated between low 90s to high 120s ( all other vitals within normal parameters). Pt only had Lopressor scheduled, which was given. Pt has PRNs for pain and anxiety, but refused both of them, stating she had no pain nor anxiety at the moment. Charge and on-call provider were informed. 12-lead EKG ordered and performed. Vitals taken right after EKG and were stable (HR  was 78). Pt is calm and resting. Charge and on-call provider were notified, will continue to monitor.

## 2019-06-28 ENCOUNTER — Inpatient Hospital Stay (HOSPITAL_COMMUNITY): Payer: MEDICARE | Admitting: Speech Pathology

## 2019-06-28 ENCOUNTER — Inpatient Hospital Stay (HOSPITAL_COMMUNITY): Payer: MEDICARE

## 2019-06-28 ENCOUNTER — Inpatient Hospital Stay (HOSPITAL_COMMUNITY): Payer: MEDICARE | Admitting: Occupational Therapy

## 2019-06-28 DIAGNOSIS — S72142G Displaced intertrochanteric fracture of left femur, subsequent encounter for closed fracture with delayed healing: Secondary | ICD-10-CM

## 2019-06-28 DIAGNOSIS — F0391 Unspecified dementia with behavioral disturbance: Secondary | ICD-10-CM

## 2019-06-28 DIAGNOSIS — F03918 Unspecified dementia, unspecified severity, with other behavioral disturbance: Secondary | ICD-10-CM

## 2019-06-28 DIAGNOSIS — F05 Delirium due to known physiological condition: Secondary | ICD-10-CM

## 2019-06-28 LAB — CBC WITH DIFFERENTIAL/PLATELET
Abs Immature Granulocytes: 0.1 10*3/uL — ABNORMAL HIGH (ref 0.00–0.07)
Basophils Absolute: 0 10*3/uL (ref 0.0–0.1)
Basophils Relative: 0 %
Eosinophils Absolute: 0.3 10*3/uL (ref 0.0–0.5)
Eosinophils Relative: 4 %
HCT: 28.1 % — ABNORMAL LOW (ref 36.0–46.0)
Hemoglobin: 9.2 g/dL — ABNORMAL LOW (ref 12.0–15.0)
Immature Granulocytes: 1 %
Lymphocytes Relative: 15 %
Lymphs Abs: 1.2 10*3/uL (ref 0.7–4.0)
MCH: 32.5 pg (ref 26.0–34.0)
MCHC: 32.7 g/dL (ref 30.0–36.0)
MCV: 99.3 fL (ref 80.0–100.0)
Monocytes Absolute: 0.8 10*3/uL (ref 0.1–1.0)
Monocytes Relative: 10 %
Neutro Abs: 5.7 10*3/uL (ref 1.7–7.7)
Neutrophils Relative %: 70 %
Platelets: 150 10*3/uL (ref 150–400)
RBC: 2.83 MIL/uL — ABNORMAL LOW (ref 3.87–5.11)
RDW: 13.9 % (ref 11.5–15.5)
WBC: 8.1 10*3/uL (ref 4.0–10.5)
nRBC: 0 % (ref 0.0–0.2)

## 2019-06-28 LAB — COMPREHENSIVE METABOLIC PANEL
ALT: 12 U/L (ref 0–44)
AST: 14 U/L — ABNORMAL LOW (ref 15–41)
Albumin: 2.3 g/dL — ABNORMAL LOW (ref 3.5–5.0)
Alkaline Phosphatase: 43 U/L (ref 38–126)
Anion gap: 8 (ref 5–15)
BUN: 23 mg/dL (ref 8–23)
CO2: 29 mmol/L (ref 22–32)
Calcium: 7.8 mg/dL — ABNORMAL LOW (ref 8.9–10.3)
Chloride: 99 mmol/L (ref 98–111)
Creatinine, Ser: 0.66 mg/dL (ref 0.44–1.00)
GFR calc Af Amer: >60 mL/min (ref >60)
GFR calc non Af Amer: >60 mL/min (ref >60)
Glucose, Bld: 112 mg/dL — ABNORMAL HIGH (ref 70–99)
Potassium: 3.1 mmol/L — ABNORMAL LOW (ref 3.5–5.1)
Sodium: 136 mmol/L (ref 135–145)
Total Bilirubin: 1 mg/dL (ref 0.3–1.2)
Total Protein: 4.4 g/dL — ABNORMAL LOW (ref 6.5–8.1)

## 2019-06-28 MED ORDER — CALCIUM CARBONATE 1250 (500 CA) MG PO TABS
1.0000 | ORAL_TABLET | Freq: Every day | ORAL | Status: DC
  Administered 2019-06-29 – 2019-07-08 (×10): 500 mg via ORAL
  Filled 2019-06-28 (×10): qty 1

## 2019-06-28 MED ORDER — POTASSIUM CHLORIDE CRYS ER 20 MEQ PO TBCR
40.0000 meq | EXTENDED_RELEASE_TABLET | Freq: Two times a day (BID) | ORAL | Status: AC
  Administered 2019-06-28 – 2019-06-29 (×2): 40 meq via ORAL
  Filled 2019-06-28 (×3): qty 2

## 2019-06-28 MED ORDER — DIPHENOXYLATE-ATROPINE 2.5-0.025 MG PO TABS
1.0000 | ORAL_TABLET | Freq: Once | ORAL | Status: AC
  Administered 2019-06-28: 1 via ORAL
  Filled 2019-06-28: qty 1

## 2019-06-28 NOTE — Care Management Note (Signed)
[facility] Individual Statement of Services  Patient Name:  Michelle Gregory  Date:  06/28/2019  Welcome to the [facility].  Our goal is to provide you with an individualized program based on your diagnosis and situation, designed to meet your specific needs.  With this comprehensive rehabilitation program, you will be expected to participate in at least 3 hours of rehabilitation therapies Monday-Friday, with modified therapy programming on the weekends.  Your rehabilitation program will include the following services:  Physical Therapy (PT), Occupational Therapy (OT), Speech Therapy (ST), 24 hour per day rehabilitation nursing, Case Management (Social Worker), Rehabilitation Medicine, Nutrition Services and Pharmacy Services  Weekly team conferences will be held on Wednesday to discuss your progress.  Your Social Worker will talk with you frequently to get your input and to update you on team discussions.  Team conferences with you and your family in attendance may also be held.  Expected length of stay: 7-10 days  Overall anticipated outcome: CGA-some min for ADL's  Depending on your progress and recovery, your program may change. Your Social Worker will coordinate services and will keep you informed of any changes. Your Social Worker's name and contact numbers are listed  below.  The following services may also be recommended but are not provided by the [facility]:    [place] will be made to provide these services after discharge if needed.  Arrangements include referral to agencies that provide these services.  Your insurance has been verified to be:  Medicare [place] Your primary doctor is:  [facility]  Pertinent information will be shared with your doctor and your insurance company.  Social Worker:  Ovidio Kin, [place] or ([phone]  Information discussed with and copy given to patient by: Elease Hashimoto, 06/28/2019, 9:12 AM

## 2019-06-28 NOTE — Progress Notes (Signed)
Inpatient Rehabilitation  Patient information reviewed and entered into eRehab system by Sofya Moustafa M. Ashelyn Mccravy, M.A., CCC/SLP, PPS Coordinator.  Information including medical coding, functional ability and quality indicators will be reviewed and updated through discharge.    

## 2019-06-28 NOTE — Evaluation (Signed)
Physical Therapy Assessment and Plan  Patient Details  Name: Michelle Gregory MRN: [id number] Date of Birth: 11-Sep-1923  PT Diagnosis: Abnormal posture, Abnormality of gait, Difficulty walking, Impaired cognition, Impaired sensation, Muscle weakness, Pain in joint and Pain in L hip Rehab Potential: Good ELOS: 7-10 days   Today's Date: 06/28/2019 PT Individual Time: 0800-0905 PT Individual Time Calculation (min): 65 min    Problem List:  Patient Active Problem List   Diagnosis Date Noted  . Senile dementia with delirium without behavioral disturbance ([place]) 06/27/2019  . E-coli UTI 06/27/2019  . Acute blood loss as cause of postoperative anemia 06/27/2019  . Pelvic fracture ([place]) 06/27/2019  . Acute delirium   . Closed left hip fracture, initial encounter ([place]) 06/21/2019  . Anxiety with depression 06/21/2019  . Diarrhea 03/24/2019  . Delirium 02/01/2019  . Shingles 01/23/2019  . Right wrist pain 12/06/2018  . Laceration of wrist, right 12/06/2018  . Fracture, ulna, distal 12/06/2018  . Thrombocytopenia ([place]) 12/06/2018  . Dementia, senile ([place]) 11/22/2018  . Hand injury, left, initial encounter 09/01/2018  . H/O falling 05/03/2018  . Episodic confusion 05/03/2018  . Sacral fracture ([place]) 04/03/2018  . Fall at home 03/29/2018  . Anxiety disorder 03/29/2018  . Fatigue 03/29/2018  . Chronic pain 02/09/2018  . Cerumen impaction 12/06/2017  . H/O Clostridium difficile infection 10/13/2017  . Recurrent UTI (urinary tract infection) 09/06/2017  . Auditory hallucinations 03/03/2017  . Female cystocele 04/13/2016  . Bowel habit changes 12/30/2015  . Compression fracture 12/30/2015  . Routine general medical examination at a health care facility 04/09/2015  . UTI (urinary tract infection) 05/09/2014  . Candidal intertrigo 03/28/2014  . Encounter for Medicare annual wellness exam 03/21/2013  . Gout 09/19/2012  . Age related osteoporosis 05/05/2011  . Degenerative lumbar disc  01/20/2011  . VARICOSE VEINS, LOWER EXTREMITIES 09/04/2010  . Prediabetes 01/08/2010  . Hyperlipidemia 05/09/2008  . Essential hypertension 05/09/2008  . HEMORRHOIDS, INTERNAL 05/09/2008  . Allergic rhinitis 05/09/2008  . Mild reactive airways disease 05/09/2008  . GERD 05/09/2008  . IBS 05/09/2008  . Osteoarthritis 05/09/2008  . History of malignant neoplasm of large intestine 05/09/2008    Past Medical History:  Past Medical History:  Diagnosis Date  . Allergic rhinitis   . Anxiety   . C. difficile diarrhea 10/2017  . Cancer ([place])   . Depression   . GERD (gastroesophageal reflux disease)   . History of colon cancer   . History of recurrent UTIs   . Hyperlipidemia   . Hypertension   . Osteoarthritis   . Spinal compression fracture ([place])   . Vertigo    Past Surgical History:  Past Surgical History:  Procedure Laterality Date  . APPENDECTOMY    . CATARACT EXTRACTION    . COLON RESECTION    . INTRAMEDULLARY (IM) NAIL INTERTROCHANTERIC Left 06/22/2019   Procedure: INTRAMEDULLARY (IM) NAIL INTERTROCHANTRIC;  Surgeon: Rod Can, MD;  Location: [facility];  Service: Orthopedics;  Laterality: Left;  . TONSILLECTOMY      Assessment & Plan Clinical Impression: Patient is a 85 y.o. year old female with history of GERD, anxiety, HTN, vertigo, chronic LBP, colon cancer, episodic confusion, multiple falls--right distal radius fracture 2/20 and recurrent fall on 06/21/2019 with subsequent left IT femur fracture and inability to walk.  History taken from chart review.  She was seen by Ortho and underwent IM nail of left femur on 06/22/2019.  Post op to be WBAT.  Hospital course complicated by acute blood  loss anemia, leukocytosis, as well as electrolyte abnormalities and UTI.  E coli UTI noted at admission and she was treated with 5 day course of IV rocephin. Hospital course further complicated by hypotension requiring fluid bolus 8/21, diarrhea felt to be due to antibiotics but continues  on [place] as well as ongoing issues with confusion, agitation, delirium--worse 8/24.  Therapy ongoing and patient continues to be limited by pain, posterior lean with standing as well as cognitive deficits. Family requesting ?CIR to SNF as patient showing improvement in participation and was felt to be good CIR candidate. Patient transferred to CIR on 06/27/2019 .   Patient currently requires mod with mobility secondary to muscle weakness and muscle joint tightness, decreased cardiorespiratoy endurance, decreased memory and decreased sitting balance, decreased standing balance, decreased postural control and decreased balance strategies.  Prior to hospitalization, patient was modified independent  with mobility and lived with Alone, Daughter in a House home.  Home access is 5Stairs to enter.  Patient will benefit from skilled PT intervention to maximize safe functional mobility, minimize fall risk and decrease caregiver burden for planned discharge home with 24 hour assist.  Anticipate patient will benefit from follow up [facility] at discharge.  PT - End of Session Activity Tolerance: Tolerates 30+ min activity with multiple rests Endurance Deficit: Yes Endurance Deficit Description: requires long rest breaks between activities due to SOB and elevated HR, 120s, with minimal activity. PT Assessment Rehab Potential (ACUTE/IP ONLY): Good PT Barriers to Discharge: Home environment access/layout PT Barriers to Discharge Comments: 5 STE a 1 level home, need to confirm home set up and daughters ability to assist patient at d/c PT Patient demonstrates impairments in the following area(s): Balance;Edema;Endurance;Motor;Nutrition;Pain;Safety;Sensory;Skin Integrity PT Transfers Functional Problem(s): Bed Mobility;Bed to Chair;Car;Furniture;Floor PT Locomotion Functional Problem(s): Ambulation;Wheelchair Mobility;Stairs PT Plan PT Intensity: Minimum of 1-2 x/day ,45 to 90 minutes PT Frequency: 5 out of 7 days PT  Duration Estimated Length of Stay: 7-10 days PT Treatment/Interventions: Ambulation/gait training;Discharge planning;Functional mobility training;Psychosocial support;Therapeutic Activities;Visual/perceptual remediation/compensation;Balance/vestibular training;Disease management/prevention;Neuromuscular re-education;Skin care/wound management;Therapeutic Exercise;Wheelchair propulsion/positioning;Cognitive remediation/compensation;DME/adaptive equipment instruction;Pain management;Splinting/orthotics;UE/LE Strength taining/ROM;Community reintegration;Functional electrical stimulation;Patient/family education;Stair training;UE/LE Coordination activities PT Transfers Anticipated Outcome(s): CGA using LRAD PT Locomotion Anticipated Outcome(s): CGA using LRAD 50 ft PT Recommendation Recommendations for Other Services: Neuropsych consult Follow Up Recommendations: Home health PT Patient destination: Home Equipment Details: Patient reports she has a RW and transport chair at home. Need to confirm as patient is a poor historian  Skilled Therapeutic Intervention In addition to the PT evaluation below, the patient performed the following skilled PT interventions: Patient in bed upon PT arrival. Patient alert and agreeable to PT session. She reported mild pain in her L hip at beginning of session. She requested pain medicine and RN provided Tylenol during session. PT provided repositioning, distraction, and rest breaks for pain management throughout session. Vitals: BP 115/69, HR 103, SPO2 97% on RA at beginning of session.    Therapeutic Activity: Bed Mobility: Patient performed rolling R and L with min A and supine to/from sit with mod A in a flat bed without use of bed rails. Provided verbal cues for reaching for the edge of the bed when rolling and bringing LEs off the bed then pushing through B elbows to sit up. Transfers: Patient performed sit to/from stand x1, stand pivot x1, and a car transfer x1 with  mod-min A using a RW. Provided verbal cues for hand placement, scooting forward and leaning forward to stand, reaching back to sit, and sequencing when  turning. HR in 120's following each transfer and recovered to 95-105 in ~1 min with sitting rest breaks.  Gait Training:  Patient ambulated 6 feet, limited by L hip pain and decreased activity tolerance, using RW with min A. Ambulated as described below. Provided verbal cues as described below. HR 123, BP 112/70, SPO2 98% on RA after. HR recovered to 103 after 1 min sitting rest break.  Wheelchair Mobility:  Patient propelled wheelchair 45 feet, limited by decreased UE strength/endurance, with mod A using B UEs. Provided verbal cues for propulsion technique. Patient reported she has never propelled a w/c before and needed increased time to follow cues for a novel task.  Patient in w/c at end of session with breaks locked, seat belt alarm set, and all needs within reach.   Instructed pt in results of PT evaluation as detailed below, PT POC, rehab potential, rehab goals, and discharge recommendations. Additionally discussed CIR's policies regarding fall safety and use of chair alarm and/or quick release belt. Pt verbalized understanding and in agreement. Will update pt's family members as they become available.    PT Evaluation Precautions/Restrictions Precautions Precautions: Fall Restrictions Weight Bearing Restrictions: Yes LLE Weight Bearing: Weight bearing as tolerated Home Living/Prior Functioning Home Living Available Help at Discharge: Family Type of Home: House Home Access: Stairs to enter Technical brewer of Steps: 5 Entrance Stairs-Rails: Right;Left;Can reach both Home Layout: One level Bathroom Shower/Tub: Chiropodist: Handicapped height Additional Comments: Home set-up per chart review due to patient's decreased recall/cognition  Lives With: Alone;Daughter Prior Function  Able to Take Stairs?:  Yes Comments: pt stays with daughter overnight at daughter's house. Pt brought back to her house during the day Vision/Perception  Perception Perception: Within Functional Limits Praxis Praxis: Intact  Cognition Overall Cognitive Status: Impaired/Different from baseline(although some baseline memory deficits present) Arousal/Alertness: Awake/alert Orientation Level: Disoriented to place;Disoriented to time;Oriented to person;Disoriented to situation Attention: Focused;Sustained Focused Attention: Appears intact Sustained Attention: Appears intact Memory: Impaired Memory Impairment: Decreased recall of new information;Decreased long term memory;Storage deficit;Retrieval deficit Decreased Long Term Memory: Verbal basic;Functional basic Decreased Short Term Memory: Verbal basic;Functional basic Awareness: Impaired Awareness Impairment: Intellectual impairment Problem Solving: Impaired Problem Solving Impairment: Verbal basic;Functional basic Executive Function: (all impaired due to lower level deficits) Safety/Judgment: Appears intact Sensation Sensation Light Touch: Impaired by gross assessment Proprioception: Appears Intact Additional Comments: Reported her fingers feel numb "like they are asleep," reports this is new since surgery Coordination Gross Motor Movements are Fluid and Coordinated: No Coordination and Movement Description: generalized weakness, decreased L LE ROM and pain from L femur fracture Motor  Motor Motor: Other (comment) Motor - Skilled Clinical Observations: generalized weakness, decreased L LE ROM and pain from L femur fracture  Mobility Bed Mobility Bed Mobility: Rolling Right;Rolling Left;Supine to Sit;Sit to Supine Rolling Right: Minimal Assistance - Patient > 75% Rolling Left: Minimal Assistance - Patient > 75% Supine to Sit: Moderate Assistance - Patient 50-74% Sit to Supine: Moderate Assistance - Patient 50-74% Transfers Transfers: Sit to  Stand;Stand to Sit;Stand Pivot Transfers Sit to Stand: Moderate Assistance - Patient 50-74% Stand to Sit: Moderate Assistance - Patient 50-74% Stand Pivot Transfers: Moderate Assistance - Patient 50 - 74% Stand Pivot Transfer Details: Visual cues/gestures for sequencing;Verbal cues for gait pattern;Verbal cues for safe use of DME/AE;Verbal cues for sequencing;Manual facilitation for weight shifting;Verbal cues for precautions/safety Stand Pivot Transfer Details (indicate cue type and reason): Provided cues for hand placement on RW, stepping with L LE first to  offweight the leg for pain managment, and reaching back to sit. Provided manual facilitation of forward weight shift due to posterior lean in standing. Transfer Programmer, multimedia): Manufacturing systems engineer Ambulation: Yes Gait Assistance: Minimal Assistance - Patient > 75% Gait Distance (Feet): 6 Feet Assistive device: Rolling walker Gait Assistance Details: Verbal cues for gait pattern;Verbal cues for safe use of DME/AE;Verbal cues for sequencing;Manual facilitation for weight shifting Gait Assistance Details: Provided cues for step-to gait pattern leading with L LE, WBAT on L LE, staying in the middle of the RW, and provided manual facilitation for forward weight shift due to posterior lean in standing Gait Gait: Yes Gait Pattern: Decreased stance time - left;Decreased step length - right;Step-to pattern;Decreased stride length;Decreased hip/knee flexion - right;Decreased hip/knee flexion - left;Decreased weight shift to left;Left flexed knee in stance;Antalgic;Decreased trunk rotation;Narrow base of support;Trunk flexed Gait velocity: decreased Stairs / Additional Locomotion Stairs: No Architect: Yes Wheelchair Assistance: Moderate Assistance - Patient 50 - 74% Wheelchair Propulsion: Both upper extremities Wheelchair Parts Management: Needs assistance Distance: 85'  Trunk/Postural Assessment   Cervical Assessment Cervical Assessment: Exceptions to WFL(forward head) Thoracic Assessment Thoracic Assessment: Exceptions to WFL(kyphosis) Lumbar Assessment Lumbar Assessment: Exceptions to WFL(posterior pelvic tilt) Postural Control Postural Control: Deficits on evaluation(decreased/delayed)  Balance Balance Balance Assessed: Yes Static Sitting Balance Static Sitting - Balance Support: Feet supported;Left upper extremity supported;Right upper extremity supported(R trunk lean initially due to L hip pain, able to tolerate sitting in midline after cues) Static Sitting - Level of Assistance: 5: Stand by assistance Dynamic Sitting Balance Dynamic Sitting - Balance Support: Right upper extremity supported;Left upper extremity supported;During functional activity Dynamic Sitting - Level of Assistance: 4: Min assist Dynamic Sitting - Balance Activities: Lateral lean/weight shifting;Forward lean/weight shifting;Reaching for Consulting civil engineer Standing - Balance Support: Bilateral upper extremity supported Static Standing - Level of Assistance: 4: Min assist Dynamic Standing Balance Dynamic Standing - Balance Support: Bilateral upper extremity supported;During functional activity Dynamic Standing - Level of Assistance: 4: Min assist Dynamic Standing - Balance Activities: Forward lean/weight shifting;Lateral lean/weight shifting Extremity Assessment  RUE Assessment RUE Assessment: Exceptions to [facility] Active Range of Motion (AROM) Comments: discomfort with shoulder flexion >90 degrees, reports she doesn't reach overhead typically General Strength Comments: Grossly in sitting 4/5 throughout LUE Assessment LUE Assessment: Exceptions to [facility] Active Range of Motion (AROM) Comments: discomfort with shoulder flexion >90 degrees, reports she doesn't reach overhead typically General Strength Comments: Grossly in sitting 4/5 throughout RLE Assessment RLE Assessment: Exceptions to  [facility] Active Range of Motion (AROM) Comments: tigh hamstrings an dheel cords, DF to nutral General Strength Comments: Grossly in sitting 4+/5 throughout LLE Assessment LLE Assessment: Exceptions to [facility] Active Range of Motion (AROM) Comments: limited hip flexion and knee flexion due to pain, increased hip internal rotation in all positions and with all mobility General Strength Comments: Grossly at least 3+/5 throughout, limited formal assessment due to L LE pain    Refer to Care Plan for Long Term Goals  Recommendations for other services: Neuropsych  Discharge Criteria: Patient will be discharged from PT if patient refuses treatment 3 consecutive times without medical reason, if treatment goals not met, if there is a change in medical status, if patient makes no progress towards goals or if patient is discharged from hospital.  The above assessment, treatment plan, treatment alternatives and goals were discussed and mutually agreed upon: by patient  Chanceler Pullin L Shuree Brossart PT, DPT  06/28/2019, 1:02 PM

## 2019-06-28 NOTE — Patient Care Conference (Signed)
Inpatient RehabilitationTeam Conference and Plan of Care Update Date: 06/28/2019   Time: 10:25 AM    Patient Name: Michelle Gregory      Medical Record Number: [id number]  Date of Birth: 1923/01/28 Sex: Female         Room/Bed: 4M06C/4M06C-01 Payor Info: Payor: MEDICARE RAILROAD / Plan: MEDICARE RAILROAD / Product Type: *No Product type* /    Admitting Diagnosis: 4. Gen Team  Hip FX  Admit Date/Time:  06/27/2019 11:29 AM Admission Comments: No comment available   Primary Diagnosis:  <principal problem not specified> Principal Problem: <principal problem not specified>  Patient Active Problem List   Diagnosis Date Noted  . Dementia with behavioral disturbance ([place])   . Senile dementia with delirium without behavioral disturbance ([place]) 06/27/2019  . E-coli UTI 06/27/2019  . Acute blood loss as cause of postoperative anemia 06/27/2019  . Pelvic fracture ([place]) 06/27/2019  . Subacute confusional state   . Closed displaced intertrochanteric fracture of left femur ([place]) 06/21/2019  . Anxiety with depression 06/21/2019  . Diarrhea 03/24/2019  . Delirium 02/01/2019  . Shingles 01/23/2019  . Right wrist pain 12/06/2018  . Laceration of wrist, right 12/06/2018  . Fracture, ulna, distal 12/06/2018  . Thrombocytopenia ([place]) 12/06/2018  . Dementia, senile ([place]) 11/22/2018  . Hand injury, left, initial encounter 09/01/2018  . H/O falling 05/03/2018  . Episodic confusion 05/03/2018  . Sacral fracture ([place]) 04/03/2018  . Fall at home 03/29/2018  . Anxiety disorder 03/29/2018  . Fatigue 03/29/2018  . Chronic pain 02/09/2018  . Cerumen impaction 12/06/2017  . H/O Clostridium difficile infection 10/13/2017  . Recurrent UTI (urinary tract infection) 09/06/2017  . Auditory hallucinations 03/03/2017  . Female cystocele 04/13/2016  . Bowel habit changes 12/30/2015  . Compression fracture 12/30/2015  . Routine general medical examination at a health care facility 04/09/2015  . UTI (urinary tract  infection) 05/09/2014  . Candidal intertrigo 03/28/2014  . Encounter for Medicare annual wellness exam 03/21/2013  . Gout 09/19/2012  . Age related osteoporosis 05/05/2011  . Degenerative lumbar disc 01/20/2011  . VARICOSE VEINS, LOWER EXTREMITIES 09/04/2010  . Prediabetes 01/08/2010  . Hyperlipidemia 05/09/2008  . Essential hypertension 05/09/2008  . HEMORRHOIDS, INTERNAL 05/09/2008  . Allergic rhinitis 05/09/2008  . Mild reactive airways disease 05/09/2008  . GERD 05/09/2008  . IBS 05/09/2008  . Osteoarthritis 05/09/2008  . History of malignant neoplasm of large intestine 05/09/2008    Expected Discharge Date: Expected Discharge Date: (10 to 14 days)  Team Members Present: Physician leading conference: Dr. Courtney Heys Social Worker Present: Ovidio Kin, LCSW Nurse Present: Rayetta Humphrey, RN PT Present: Apolinar Junes, PT OT Present: Roanna Epley, [place], OT SLP Present: Weston Anna, SLP PPS Coordinator present : Gunnar Fusi, SLP     Current Status/Progress Goal Weekly Team Focus  Medical     managing BP and HR which was elevated 8/25   medical stability   HR and BP  Bowel/Bladder   pt incontinent of both, LBM 8/25 (loose stools), current order for urine culture  to have less incontinent episodes  assess toileting q shift and prn   Swallow/Nutrition/ Hydration             ADL's     new eval        Mobility     new eval        Communication             Safety/Cognition/ Behavioral Observations  Eval Pending  Pain   pt has had complaint of pain (left hip and leg)  address pain meds in timely manner, pain <5  assess pain q shift and prn   Skin   stage 1 on left buttocks, skin tear on left elbow, incision flor surgery on left hip/leg, large ecchymosis on posterior left thigh  address current skin issues and prevent further/new breakdown  assess skin q shift and prn      *See Care Plan and progress notes for long and short-term goals.      Barriers to Discharge  Current Status/Progress Possible Resolutions Date Resolved   Physician                    Nursing                  PT  Home environment access/layout  5 STE a 1 level home, need to confirm home set up and daughters ability to assist patient at d/c              OT                  SLP                SW                Discharge Planning/Teaching Needs:    HOme with daughter and son in-law who can provide 24 hr care if needed.     Team Discussion:  New eval-medically adjusting BP still running high. HR elevated last night. Needs many rest breaks. Daughter to be here daily to provide support and comfort.  Revisions to Treatment Plan:  New eval        I attest that I was present, lead the team conference, and concur with the assessment and plan of the team. Teleconference held due to COVID 19   Jaiden Dinkins, [place] Rhyme 06/29/2019, 9:01 AM

## 2019-06-28 NOTE — Evaluation (Signed)
Speech Language Pathology Assessment and Plan  Patient Details  Name: Michelle Gregory MRN: [id number] Date of Birth: 1923-07-30  SLP Diagnosis: Cognitive Impairments  Rehab Potential: Good ELOS: 7-10 days    Today's Date: 06/28/2019 SLP Individual Time: 4098-1191 SLP Individual Time Calculation (min): 60 min   Problem List:  Patient Active Problem List   Diagnosis Date Noted  . Senile dementia with delirium without behavioral disturbance ([place]) 06/27/2019  . E-coli UTI 06/27/2019  . Acute blood loss as cause of postoperative anemia 06/27/2019  . Pelvic fracture ([place]) 06/27/2019  . Acute delirium   . Closed left hip fracture, initial encounter ([place]) 06/21/2019  . Anxiety with depression 06/21/2019  . Diarrhea 03/24/2019  . Delirium 02/01/2019  . Shingles 01/23/2019  . Right wrist pain 12/06/2018  . Laceration of wrist, right 12/06/2018  . Fracture, ulna, distal 12/06/2018  . Thrombocytopenia ([place]) 12/06/2018  . Dementia, senile ([place]) 11/22/2018  . Hand injury, left, initial encounter 09/01/2018  . H/O falling 05/03/2018  . Episodic confusion 05/03/2018  . Sacral fracture ([place]) 04/03/2018  . Fall at home 03/29/2018  . Anxiety disorder 03/29/2018  . Fatigue 03/29/2018  . Chronic pain 02/09/2018  . Cerumen impaction 12/06/2017  . H/O Clostridium difficile infection 10/13/2017  . Recurrent UTI (urinary tract infection) 09/06/2017  . Auditory hallucinations 03/03/2017  . Female cystocele 04/13/2016  . Bowel habit changes 12/30/2015  . Compression fracture 12/30/2015  . Routine general medical examination at a health care facility 04/09/2015  . UTI (urinary tract infection) 05/09/2014  . Candidal intertrigo 03/28/2014  . Encounter for Medicare annual wellness exam 03/21/2013  . Gout 09/19/2012  . Age related osteoporosis 05/05/2011  . Degenerative lumbar disc 01/20/2011  . VARICOSE VEINS, LOWER EXTREMITIES 09/04/2010  . Prediabetes 01/08/2010  . Hyperlipidemia 05/09/2008   . Essential hypertension 05/09/2008  . HEMORRHOIDS, INTERNAL 05/09/2008  . Allergic rhinitis 05/09/2008  . Mild reactive airways disease 05/09/2008  . GERD 05/09/2008  . IBS 05/09/2008  . Osteoarthritis 05/09/2008  . History of malignant neoplasm of large intestine 05/09/2008   Past Medical History:  Past Medical History:  Diagnosis Date  . Allergic rhinitis   . Anxiety   . C. difficile diarrhea 10/2017  . Cancer ([place])   . Depression   . GERD (gastroesophageal reflux disease)   . History of colon cancer   . History of recurrent UTIs   . Hyperlipidemia   . Hypertension   . Osteoarthritis   . Spinal compression fracture ([place])   . Vertigo    Past Surgical History:  Past Surgical History:  Procedure Laterality Date  . APPENDECTOMY    . CATARACT EXTRACTION    . COLON RESECTION    . INTRAMEDULLARY (IM) NAIL INTERTROCHANTERIC Left 06/22/2019   Procedure: INTRAMEDULLARY (IM) NAIL INTERTROCHANTRIC;  Surgeon: Rod Can, MD;  Location: [facility];  Service: Orthopedics;  Laterality: Left;  . TONSILLECTOMY      Assessment / Plan / Recommendation Clinical Impression   HPI: [name] is a 85 year old female with history of GERD, anxiety, HTN, vertigo, chronic LBP, colon cancer, episodic confusion, multiple falls--right distal radius fracture 2/20 and recurrent fall on 06/21/2019 with subsequent left IT femur fracture and inability to walk.  History taken from chart review.  She was seen by Ortho and underwent IM nail of left femur on 06/22/2019.  Post op to be WBAT.  Hospital course complicated by acute blood loss anemia, leukocytosis, as well as electrolyte abnormalities and UTI.  E  coli UTI noted at admission and she was treated with 5 day course of IV rocephin. Hospital course further complicated by hypotension requiring fluid bolus 8/21, diarrhea felt to be due to antibiotics but continues on Senna as well as ongoing issues with confusion, agitation, delirium--worse 8/24.  Therapy  ongoing and patient continues to be limited by pain, posterior lean with standing as well as cognitive deficits. Pt was admitted to CIR 06/27/19 and ST evaluation was completed 06/28/19 with results as follows:  Pt presents with moderate cognitive impairments primarily in the area of orientation, short term memory, problem solving, and awareness. According to chart, pt has baseline dementia and memory deficits, however no family members were present to provide details regarding baseline deficits. Pt demonstrated decreased intellectual awareness related to cognitive function, only able to state limitations with mobility as impaired since this admission. Pt oriented to self and situation, however not place or time (stated she was in [place] in some offices and thought it was January or February). to Pt's standard scores on subtests of the Cognistat are indicative of moderate-severe impairments present in short-term recall, basic and semi-complex problem solving, as well as reasoning/executive functioning. Pt would benefit from skilled ST interventions while inpatient in order to maximize functional independence and reduce burden of care upon discharge.    Skilled Therapeutic Interventions          Cognitive-linguistic evaluation was completed - please see details above. Mod A verbal and visual cues provided for basic problem solving during visuospatial tasks and math calculations. Fluctuating Mod (category) to Max (multiple choice) cues provided for short term recall. Max A verbal/contextual cues also provided for orientation to place and time.    SLP Assessment  Patient will need skilled [facility] during CIR admission    Recommendations  Oral Care Recommendations: Oral care BID Patient destination: Home Follow up Recommendations: Home Health SLP;24 hour supervision/assistance Equipment Recommended: None recommended by SLP    SLP Frequency 3 to 5 out of 7 days   SLP  Duration  SLP Intensity  SLP Treatment/Interventions 7-10 days  Minumum of 1-2 x/day, 30 to 90 minutes  Cognitive remediation/compensation;Cueing hierarchy;Environmental controls;Internal/external aids;Functional tasks;Patient/family education;Therapeutic Activities    Pain Pain Assessment Pain Scale: 0-10 Pain Score: 5  Pain Type: Surgical pain Pain Location: Hip Pain Orientation: Left Pain Descriptors / Indicators: Aching Pain Frequency: Intermittent Pain Onset: On-going Patients Stated Pain Goal: 0 Pain Intervention(s): Medication (See eMAR);Repositioned Multiple Pain Sites: No  Prior Functioning Type of Home: House  Lives With: Alone;Daughter Available Help at Discharge: Family  Short Term Goals: Week 1: SLP Short Term Goal 1 (Week 1): Pt recall basic biographical and/or daily information with Mod A for use of external aids. SLP Short Term Goal 2 (Week 1): Pt will orient to place, time, and situation with Mod A multimodal cues. SLP Short Term Goal 3 (Week 1): Pt will complete basic, familiar tasks with Mod A cues for problem solving. SLP Short Term Goal 4 (Week 1): Pt will demonstrate intellectual awareness by stating at least 1 physical and 1 cognitive deficit with Mod A cues.  Refer to Care Plan for Long Term Goals  Recommendations for other services: None   Discharge Criteria: Patient will be discharged from SLP if patient refuses treatment 3 consecutive times without medical reason, if treatment goals not met, if there is a change in medical status, if patient makes no progress towards goals or if patient is discharged from hospital.  The above  assessment, treatment plan, treatment alternatives and goals were discussed and mutually agreed upon: No family available/patient unable  Arbutus Leas 06/28/2019, 1:17 PM

## 2019-06-28 NOTE — Evaluation (Signed)
Occupational Therapy Assessment and Plan  Patient Details  Name: Michelle Gregory MRN: [id number] Date of Birth: 08/05/1923  OT Diagnosis: acute pain and muscle weakness (generalized) Rehab Potential: Rehab Potential (ACUTE ONLY): Good ELOS: ~10-12 days   Today's Date: 06/28/2019 OT Individual Time: 1300-1410 OT Individual Time Calculation (min): 70 min     Problem List:  Patient Active Problem List   Diagnosis Date Noted  . Senile dementia with delirium without behavioral disturbance ([facility]) 06/27/2019  . E-coli UTI 06/27/2019  . Acute blood loss as cause of postoperative anemia 06/27/2019  . Pelvic fracture ([facility]) 06/27/2019  . Acute delirium   . Closed left hip fracture, initial encounter ([facility]) 06/21/2019  . Anxiety with depression 06/21/2019  . Diarrhea 03/24/2019  . Delirium 02/01/2019  . Shingles 01/23/2019  . Right wrist pain 12/06/2018  . Laceration of wrist, right 12/06/2018  . Fracture, ulna, distal 12/06/2018  . Thrombocytopenia ([facility]) 12/06/2018  . Dementia, senile ([facility]) 11/22/2018  . Hand injury, left, initial encounter 09/01/2018  . H/O falling 05/03/2018  . Episodic confusion 05/03/2018  . Sacral fracture ([facility]) 04/03/2018  . Fall at home 03/29/2018  . Anxiety disorder 03/29/2018  . Fatigue 03/29/2018  . Chronic pain 02/09/2018  . Cerumen impaction 12/06/2017  . H/O Clostridium difficile infection 10/13/2017  . Recurrent UTI (urinary tract infection) 09/06/2017  . Auditory hallucinations 03/03/2017  . Female cystocele 04/13/2016  . Bowel habit changes 12/30/2015  . Compression fracture 12/30/2015  . Routine general medical examination at a health care facility 04/09/2015  . UTI (urinary tract infection) 05/09/2014  . Candidal intertrigo 03/28/2014  . Encounter for Medicare annual wellness exam 03/21/2013  . Gout 09/19/2012  . Age related osteoporosis 05/05/2011  . Degenerative lumbar disc 01/20/2011  . VARICOSE VEINS, LOWER EXTREMITIES 09/04/2010  .  Prediabetes 01/08/2010  . Hyperlipidemia 05/09/2008  . Essential hypertension 05/09/2008  . HEMORRHOIDS, INTERNAL 05/09/2008  . Allergic rhinitis 05/09/2008  . Mild reactive airways disease 05/09/2008  . GERD 05/09/2008  . IBS 05/09/2008  . Osteoarthritis 05/09/2008  . History of malignant neoplasm of large intestine 05/09/2008    Past Medical History:  Past Medical History:  Diagnosis Date  . Allergic rhinitis   . Anxiety   . C. difficile diarrhea 10/2017  . Cancer ([facility])   . Depression   . GERD (gastroesophageal reflux disease)   . History of colon cancer   . History of recurrent UTIs   . Hyperlipidemia   . Hypertension   . Osteoarthritis   . Spinal compression fracture ([facility])   . Vertigo    Past Surgical History:  Past Surgical History:  Procedure Laterality Date  . APPENDECTOMY    . CATARACT EXTRACTION    . COLON RESECTION    . INTRAMEDULLARY (IM) NAIL INTERTROCHANTERIC Left 06/22/2019   Procedure: INTRAMEDULLARY (IM) NAIL INTERTROCHANTRIC;  Surgeon: Swinteck, Brian, MD;  Location: [facility];  Service: Orthopedics;  Laterality: Left;  . TONSILLECTOMY      Assessment & Plan Clinical Impression: Patient is a 85 y.o. year old female history of GERD, anxiety, HTN, vertigo, chronic LBP, colon cancer, episodic confusion, multiple falls--right distal radius fracture 2/20 and recurrent fall on 06/21/2019 with subsequent left IT femur fracture and inability to walk.  History taken from chart review.  She was seen by Ortho and underwent IM nail of left femur on 06/22/2019.  Post op to be WBAT.  Hospital course complicated by acute blood loss anemia, leukocytosis, as well as electrolyte abnormalities and UTI.    E coli UTI noted at admission and she was treated with 5 day course of IV rocephin. Hospital course further complicated by hypotension requiring fluid bolus 8/21, diarrhea felt to be due to antibiotics but continues on Senna as well as ongoing issues with confusion, agitation,  delirium--worse 8/24.  Therapy ongoing and patient continues to be limited by pain, posterior lean with standing as well as cognitive deficits. Patient transferred to CIR on 06/27/2019 .    Patient currently requires mod with basic self-care skills and basic mobility  secondary to muscle weakness, decreased cardiorespiratoy endurance and decreased standing balance, decreased balance strategies and acute pain.  Pt does have a h/o sundowning and dementia PTA. Prior to hospitalization, patient could complete ADLS with  distant supervision - mod I   Patient will benefit from skilled intervention to decrease level of assist with basic self-care skills and increase independence with basic self-care skills prior to discharge home with care partner.  Anticipate patient will require 24 hour supervision and intermittent supervision and follow up home health.  OT - End of Session Activity Tolerance: Tolerates 30+ min activity with multiple rests Endurance Deficit: Yes Endurance Deficit Description: requires long rest breaks between activities due to SOB and elevated HR, 120s, with minimal activity. OT Assessment Rehab Potential (ACUTE ONLY): Good OT Patient demonstrates impairments in the following area(s): Balance;Sensory;Edema;Endurance;Motor;Pain;Safety OT Basic ADL's Functional Problem(s): Bathing;Dressing;Toileting OT Transfers Functional Problem(s): Toilet;Tub/Shower OT Additional Impairment(s): None OT Plan OT Intensity: Minimum of 1-2 x/day, 45 to 90 minutes OT Frequency: 5 out of 7 days OT Duration/Estimated Length of Stay: ~10-12 days OT Treatment/Interventions: Balance/vestibular training;Disease mangement/prevention;Self Care/advanced ADL retraining;Therapeutic Exercise;Cognitive remediation/compensation;DME/adaptive equipment instruction;Pain management;Skin care/wound managment;UE/LE Strength taining/ROM;Community reintegration;Patient/family education;UE/LE Coordination activities;Discharge  planning;Functional mobility training;Psychosocial support;Therapeutic Activities OT Self Feeding Anticipated Outcome(s): n/a OT Basic Self-Care Anticipated Outcome(s): supervision OT Toileting Anticipated Outcome(s): supervision OT Bathroom Transfers Anticipated Outcome(s): supervision OT Recommendation Patient destination: Home Follow Up Recommendations: Home health OT Equipment Recommended: To be determined   Skilled Therapeutic Intervention Ot eval initiated with Ot goals, purpose and role discussed with pt and pt's daughter. Self care retraining at shower level. Pt required mod A to come to EOB with extra time and bed rail due to pain. Pt perform stand pivot transfer with mod A without AD. Pt transfer onto shower bench with mod A with grab bars to shower. Pt required A to bathe LB due to pain and incontinence in her brief and then again in the shower. Pt dressed in a house dress, pull up and shoes (what she wore PTA). Pt with difficulty with snapping buttons today. Daughter present and reports it took her a long time to perform tasks before. Pt able to perform sit to stands with min to mod A with cues for hand placement for optimal success. Pt was going to attempt to try to ambulate short distance but then needed to go to BR again - Pt unaware of loose stools and needing extra time due to double voiding. Total A for hygiene.  Ambulated ~8 feet with RW with min -mod A with facilitation to help sequence with RW.   Pt able to do grooming at the sink. Pt left eating lunch with daughter .   OT Evaluation Precautions/Restrictions  Precautions Precautions: Fall Restrictions Weight Bearing Restrictions: Yes LLE Weight Bearing: Weight bearing as tolerated General Chart Reviewed: Yes Family/Caregiver Present: Yes(daughter) Vital Signs Therapy Vitals Temp: 98.6 F (37 C) Temp Source: Oral Pulse Rate: 91 Resp: 16 BP: (!) 95/54 Patient  Position (if appropriate): Sitting Oxygen  Therapy O2 Device: Room Air Pain Pain Assessment Pain Scale: 0-10 Pain Score: 5  Pain Type: Surgical pain Pain Location: Hip Pain Orientation: Left Pain Descriptors / Indicators: Aching Pain Frequency: Intermittent Pain Onset: On-going Patients Stated Pain Goal: 0 Pain Intervention(s): Medication (See eMAR);Repositioned Multiple Pain Sites: No Home Living/Prior Functioning Home Living Family/patient expects to be discharged to:: Private residence Living Arrangements: Alone, Children Available Help at Discharge: Family Type of Home: House Home Access: Stairs to enter Entrance Stairs-Number of Steps: 5 Entrance Stairs-Rails: Right, Left, Can reach both Home Layout: One level Bathroom Shower/Tub: Tub/shower unit Bathroom Toilet: Handicapped height Additional Comments: pt's daughter and son in law live next door  Lives With: Alone, Daughter Prior Function  Able to Take Stairs?: Yes Comments: pt stays with daughter overnight at daughter's house. Pt brought back to her house during the day ADL ADL Eating: Set up Where Assessed-Eating: Wheelchair Grooming: Setup Where Assessed-Grooming: Wheelchair Upper Body Bathing: Setup Where Assessed-Upper Body Bathing: Shower Lower Body Bathing: Maximal assistance Where Assessed-Lower Body Bathing: Shower Upper Body Dressing: Moderate assistance Where Assessed-Upper Body Dressing: Wheelchair Lower Body Dressing: Maximal assistance Where Assessed-Lower Body Dressing: Wheelchair Toileting: Maximal assistance Where Assessed-Toileting: Toilet Toilet Transfer: Moderate assistance Toilet Transfer Method: Stand pivot Toilet Transfer Equipment: Grab bars Walk-In Shower Transfer: Moderate assistance Walk-In Shower Transfer Method: Stand pivot Walk-In Shower Equipment: Shower seat without back, Transfer tub bench Vision Baseline Vision/History: Wears glasses Wears Glasses: At all times Patient Visual Report: Other (comment)(difficult to  assess due to patient's decreased cognition, initially stated "I can't see as well" then stated "my glasses need cleaned") Vision Assessment?: No apparent visual deficits Perception  Perception: Within Functional Limits Praxis Praxis: Intact Cognition Overall Cognitive Status: History of cognitive impairments - at baseline Arousal/Alertness: Awake/alert Orientation Level: Person;Place;Situation Person: Oriented Place: Oriented Situation: Oriented Year: 2020 Month: August Day of Week: Incorrect(Tuesday) Memory: Impaired Memory Impairment: Decreased recall of new information;Decreased long term memory;Storage deficit;Retrieval deficit Decreased Long Term Memory: Verbal basic;Functional basic Decreased Short Term Memory: Verbal basic;Functional basic Immediate Memory Recall: (declined and didnt push it) Attention: Focused;Sustained Focused Attention: Appears intact Sustained Attention: Appears intact Awareness: Impaired Awareness Impairment: Intellectual impairment Problem Solving: Impaired Problem Solving Impairment: Verbal basic;Functional basic Executive Function: (all impaired due to lower level deficits) Safety/Judgment: Appears intact Sensation Sensation Light Touch: Appears Intact Proprioception: Appears Intact Additional Comments: Reported her fingers feel numb "like they are asleep," reports this is new since surgery Coordination Gross Motor Movements are Fluid and Coordinated: No Fine Motor Movements are Fluid and Coordinated: Yes Coordination and Movement Description: generalized weakness, decreased L LE ROM and pain from L femur fracture Motor  Motor Motor: Other (comment) Motor - Skilled Clinical Observations: generalized weakness, decreased L LE ROM and pain from L femur fracture Mobility  Bed Mobility Bed Mobility: Rolling Right;Rolling Left;Supine to Sit;Sit to Supine Rolling Right: Minimal Assistance - Patient > 75% Rolling Left: Minimal Assistance -  Patient > 75% Supine to Sit: Moderate Assistance - Patient 50-74% Sit to Supine: Moderate Assistance - Patient 50-74% Transfers Sit to Stand: Moderate Assistance - Patient 50-74% Stand to Sit: Moderate Assistance - Patient 50-74%  Trunk/Postural Assessment  Cervical Assessment Cervical Assessment: (forward flexed) Thoracic Assessment Thoracic Assessment: (kyphosis) Lumbar Assessment Lumbar Assessment: (posterior pelvic tilt) Postural Control Postural Control: Deficits on evaluation Righting Reactions: delayed  Balance Balance Balance Assessed: Yes Static Sitting Balance Static Sitting - Balance Support: Feet supported;Left upper extremity supported;Right upper extremity supported Static   Sitting - Level of Assistance: 5: Stand by assistance Dynamic Sitting Balance Dynamic Sitting - Balance Support: Right upper extremity supported;Left upper extremity supported;During functional activity Dynamic Sitting - Level of Assistance: 4: Min assist Dynamic Sitting - Balance Activities: Lateral lean/weight shifting;Forward lean/weight shifting;Reaching for objects Static Standing Balance Static Standing - Balance Support: Bilateral upper extremity supported Static Standing - Level of Assistance: 4: Min assist Dynamic Standing Balance Dynamic Standing - Balance Support: Bilateral upper extremity supported;During functional activity Dynamic Standing - Level of Assistance: 3: Mod assist;4: Min assist Dynamic Standing - Balance Activities: Forward lean/weight shifting;Lateral lean/weight shifting Extremity/Trunk Assessment RUE Assessment RUE Assessment: Within Functional Limits Active Range of Motion (AROM) Comments: discomfort with shoulder flexion >90 degrees, reports she doesn't reach overhead typically General Strength Comments: Grossly in sitting 4/5 throughout LUE Assessment LUE Assessment: Exceptions to WFL Active Range of Motion (AROM) Comments: discomfort with shoulder flexion >90  degrees, reports she doesn't reach overhead typically General Strength Comments: Grossly in sitting 4/5 throughout     Refer to Care Plan for Long Term Goals  Recommendations for other services: None    Discharge Criteria: Patient will be discharged from OT if patient refuses treatment 3 consecutive times without medical reason, if treatment goals not met, if there is a change in medical status, if patient makes no progress towards goals or if patient is discharged from hospital.  The above assessment, treatment plan, treatment alternatives and goals were discussed and mutually agreed upon: by patient  ,  Lynsey 06/28/2019, 3:18 PM  

## 2019-06-28 NOTE — Progress Notes (Signed)
[place] PHYSICAL MEDICINE & REHABILITATION PROGRESS NOTE   Subjective/Complaints: Pt reports she wants to go home; spend time with her family- finally admits wants to wash up, brush her teeth, and "can take meds" if has pain.  I explained she has meds if needed- according to nursing, she's confused and somewhat paranoid. Incontinent of bowel and bladder at times (LBM this AM in bed- which upset her) and they are holding bowel meds due to looser stools.  Had an episode last night where HR at rest was ~120- - wasn't anxious or upset or in pain, per chart.  Per PT, pt's HR 90-100 at rest, but jumps to 110-120 with therapy.    Objective:   No results found. Recent Labs    06/26/19 0504 06/28/19 0536  WBC 10.9* 8.1  HGB 9.5* 9.2*  HCT 28.8* 28.1*  PLT PLATELET CLUMPS NOTED ON SMEAR, UNABLE TO ESTIMATE 150   Recent Labs    06/28/19 0536  NA 136  K 3.1*  CL 99  CO2 29  GLUCOSE 112*  BUN 23  CREATININE 0.66  CALCIUM 7.8*    Intake/Output Summary (Last 24 hours) at 06/28/2019 1609 Last data filed at 06/28/2019 0820 Gross per 24 hour  Intake 340 ml  Output 800 ml  Net -460 ml     Physical Exam: Vital Signs Blood pressure (!) 95/54, pulse 91, temperature 98.6 F (37 C), temperature source Oral, resp. rate 16, height 5' (1.524 m), weight 52.9 kg, last menstrual period 11/02/1980, SpO2 97 %.  Physical Exam  labs and vitals reviewed. Constitutional: She appears well-developed. She appears cachectic. Appears frail, sitting up in bed picking at breakfast- most of breakfast pushed around plate; not eaten, NAD; denies significant pain.   HENT:  Head: Normocephalic and atraumatic.  Eyes: EOM are normal. Wearing eyeglasses Neck: No tracheal deviation present. No thyromegaly present. Cardiac: RRR- nml rate- no M/R/G heard Respiratory: CTA B/L GI: soft, NT, ND, (+)BS- hypoactive  Musculoskeletal:     Comments: Left hip with edema and tenderness  Neurological: She is awake  and alert, but Ox1- thinks it's 2010 and unclear where she is..  Disoriented HOH Able to follow basic commands without difficulty. Got upset when mentioned family  Motor: Bilateral upper extremities: 4/5 proximal distal Right lower extremity: 4-/5 hip flexion, knee extension, 4/5 ankle dorsiflexion Left lower extremity: 2/5 hip flexion, knee extension (pain inhibition), 3/5 ankle dorsiflexion  Skin: She is not diaphoretic.  Left hip incision C/D/I - dressing covering hip- no seepage seen Left proximal lower extremity with ecchymosis   Psychiatric: She has a normal mood and affect. Her behavior is normal.      Assessment/Plan: 1. Functional deficits secondary to L  Hip fracture s/p IM nail  which require 3+ hours per day of interdisciplinary therapy in a comprehensive inpatient rehab setting.  Physiatrist is providing close team supervision and 24 hour management of active medical problems listed below.  Physiatrist and rehab team continue to assess barriers to discharge/monitor patient progress toward functional and medical goals  Care Tool:  Bathing    Body parts bathed by patient: Right arm, Left arm, Chest, Abdomen, Right upper leg, Left upper leg, Face   Body parts bathed by helper: Front perineal area, Buttocks, Right lower leg, Left lower leg     Bathing assist Assist Level: Moderate Assistance - Patient 50 - 74%     Upper Body Dressing/Undressing Upper body dressing   What is the patient wearing?: Dress  Upper body assist Assist Level: Moderate Assistance - Patient 50 - 74%    Lower Body Dressing/Undressing Lower body dressing      What is the patient wearing?: Underwear/pull up     Lower body assist Assist for lower body dressing: Total Assistance - Patient < 25%     Toileting Toileting    Toileting assist Assist for toileting: Maximal Assistance - Patient 25 - 49%     Transfers Chair/bed transfer  Transfers assist     Chair/bed transfer  assist level: Moderate Assistance - Patient 50 - 74% Chair/bed transfer assistive device: Museum/gallery exhibitions officer assist      Assist level: Minimal Assistance - Patient > 75% Assistive device: Walker-rolling Max distance: 6   Walk 10 feet activity   Assist  Walk 10 feet activity did not occur: Safety/medical concerns(decreased strenght/activity tolerance and pain)        Walk 50 feet activity   Assist Walk 50 feet with 2 turns activity did not occur: Safety/medical concerns(decreased strenght/activity tolerance and pain)         Walk 150 feet activity   Assist Walk 150 feet activity did not occur: Safety/medical concerns(decreased strenght/activity tolerance and pain)         Walk 10 feet on uneven surface  activity   Assist Walk 10 feet on uneven surfaces activity did not occur: Safety/medical concerns(decreased strenght/activity tolerance and pain)         Wheelchair     Assist Will patient use wheelchair at discharge?: Yes Type of Wheelchair: Manual    Wheelchair assist level: Moderate Assistance - Patient 50 - 74% Max wheelchair distance: 52'    Wheelchair 50 feet with 2 turns activity    Assist    Wheelchair 50 feet with 2 turns activity did not occur: Safety/medical concerns(decreased strenght/activity tolerance)       Wheelchair 150 feet activity     Assist  Wheelchair 150 feet activity did not occur: Safety/medical concerns(decreased strength/activity tolerance and pain)       Blood pressure (!) 95/54, pulse 91, temperature 98.6 F (37 C), temperature source Oral, resp. rate 16, height 5' (1.524 m), weight 52.9 kg, last menstrual period 11/02/1980, SpO2 97 %.  ASSESSMENT and PLAN:  1.  Deficits with mobility, self-care, delirium, agitation secondary to left hip fracture status post IM nail. WBAT on LLE             Admit to CIR 2.  Antithrombotics: -DVT/anticoagulation:  Pharmaceutical: Lovenox              -antiplatelet therapy: ASA 3. Chronic LBP/Pain Management: Continue gabapentin bid with Norco prn             Monitor with increased mobility 4. Mood: LCSW to follow for evaluation and support.              -antipsychotic agents: Continue ativan prn for agitation/sleep 5. Neuropsych: This patient is not capable of making decisions on her own behalf. 6. Skin/Wound Care: Monitor wound daily for healing.  7. Fluids/Electrolytes/Nutrition: Monitor I/Os. Offer fluids frequently as getting dehydrated. BUN 22--->32 on last check.  BMP ordered for tomorrow a.m. 8. HTN: Continue Metoprolol bid and Cozaar daily.  Monitor with increased mobility..   9. Delirum: Question dementia worsened in hospital setting--per records has issues with confusion at nights and early am. Sleep wake chart. Anticipate worsening with change of setting.  Will add zyprexa prn at bedtime    10.  Diarrhea?: Has history of C diff/colon cancer.  Discontinue Senokot.   8/26- having regular BMs- on miralax prn 11. ABLA: Continue to monitor with serial checks.  CBC ordered for tomorrow a.m. 12. Thrombocytopenia: Monitor with serial checks as on Lovenox. Monitor for signs of bleeding.  CBC ordered for tomorrow a.m. 13.  E coli UTI: Completed 5 days of rocephin --recheck urine culture for clearing due to ongoing delirium. Monitor WBC/temps for signs of infection.   14. Hypokalemia  8/26- will replete her K+ from 3.1 today- will give KCl x 2 doses 40 mEq and recheck BMP. 15. Hypocalcemia  8/26- Ca 7.8--replete  LOS: 1 days A FACE TO FACE EVALUATION WAS PERFORMED  Cherrell Maybee 06/28/2019, 4:09 PM

## 2019-06-28 NOTE — Progress Notes (Signed)
Social Work Assessment and Plan   Patient Details  Name: Michelle Gregory MRN: [id number] Date of Birth: August 17, 1923  Today's Date: 06/28/2019  Problem List:  Patient Active Problem List   Diagnosis Date Noted  . Senile dementia with delirium without behavioral disturbance ([place]) 06/27/2019  . E-coli UTI 06/27/2019  . Acute blood loss as cause of postoperative anemia 06/27/2019  . Pelvic fracture ([place]) 06/27/2019  . Acute delirium   . Closed left hip fracture, initial encounter ([place]) 06/21/2019  . Anxiety with depression 06/21/2019  . Diarrhea 03/24/2019  . Delirium 02/01/2019  . Shingles 01/23/2019  . Right wrist pain 12/06/2018  . Laceration of wrist, right 12/06/2018  . Fracture, ulna, distal 12/06/2018  . Thrombocytopenia ([place]) 12/06/2018  . Dementia, senile ([place]) 11/22/2018  . Hand injury, left, initial encounter 09/01/2018  . H/O falling 05/03/2018  . Episodic confusion 05/03/2018  . Sacral fracture ([place]) 04/03/2018  . Fall at home 03/29/2018  . Anxiety disorder 03/29/2018  . Fatigue 03/29/2018  . Chronic pain 02/09/2018  . Cerumen impaction 12/06/2017  . H/O Clostridium difficile infection 10/13/2017  . Recurrent UTI (urinary tract infection) 09/06/2017  . Auditory hallucinations 03/03/2017  . Female cystocele 04/13/2016  . Bowel habit changes 12/30/2015  . Compression fracture 12/30/2015  . Routine general medical examination at a health care facility 04/09/2015  . UTI (urinary tract infection) 05/09/2014  . Candidal intertrigo 03/28/2014  . Encounter for Medicare annual wellness exam 03/21/2013  . Gout 09/19/2012  . Age related osteoporosis 05/05/2011  . Degenerative lumbar disc 01/20/2011  . VARICOSE VEINS, LOWER EXTREMITIES 09/04/2010  . Prediabetes 01/08/2010  . Hyperlipidemia 05/09/2008  . Essential hypertension 05/09/2008  . HEMORRHOIDS, INTERNAL 05/09/2008  . Allergic rhinitis 05/09/2008  . Mild reactive airways disease 05/09/2008  . GERD 05/09/2008  .  IBS 05/09/2008  . Osteoarthritis 05/09/2008  . History of malignant neoplasm of large intestine 05/09/2008   Past Medical History:  Past Medical History:  Diagnosis Date  . Allergic rhinitis   . Anxiety   . C. difficile diarrhea 10/2017  . Cancer ([place])   . Depression   . GERD (gastroesophageal reflux disease)   . History of colon cancer   . History of recurrent UTIs   . Hyperlipidemia   . Hypertension   . Osteoarthritis   . Spinal compression fracture ([place])   . Vertigo    Past Surgical History:  Past Surgical History:  Procedure Laterality Date  . APPENDECTOMY    . CATARACT EXTRACTION    . COLON RESECTION    . INTRAMEDULLARY (IM) NAIL INTERTROCHANTERIC Left 06/22/2019   Procedure: INTRAMEDULLARY (IM) NAIL INTERTROCHANTRIC;  Surgeon: Rod Can, MD;  Location: [facility];  Service: Orthopedics;  Laterality: Left;  . TONSILLECTOMY     Social History:  reports that she has never smoked. She has never used smokeless tobacco. She reports that she does not drink alcohol or use drugs.  Family / Support Systems Marital Status: Widow/Widower Patient Roles: Parent Children: [name] Coble-daughter [id number]  [phone]-cell Other Supports: Posey Pronto in-law [phone]-home Anticipated Caregiver: daughter and son in-law Ability/Limitations of Caregiver: Daughter is able to assist was prior to admission Caregiver Availability: 24/7 Family Dynamics: Close knit family who are involved and were assisting with some of her care prior to admission-tub transfers and transportation. Daughter reports she stays at night with them, then goes to her home during the day.  Social History Preferred language: English Religion: Lutheran Cultural Background: No issues Education: HS Read: Yes Write: Yes  Employment Status: Retired Public relations account executive Issues: No issues Guardian/Conservator: None-according to MD pt is not capable of making her own decisions-will look toward her daughter how  is her POA. Daughter plans to be here daily to provide support.   Abuse/Neglect Abuse/Neglect Assessment Can Be Completed: Yes Physical Abuse: Denies Verbal Abuse: Denies Sexual Abuse: Denies Exploitation of patient/patient's resources: Denies Self-Neglect: Denies  Emotional Status Pt's affect, behavior and adjustment status: Pt is trying to get used to the new unit and will do her best. She reports being tired after therapies and wants to rest. She was mod/i prior to admission and wants to get back to this level. Daughter is aware she will need some care when she goes home from here. Recent Psychosocial Issues: other health issues-dementia and compression fractures Psychiatric History: History of anxiety-depression takes medications off and on-daughter reports she does well at home but in an unfamilar environment she has difficulty. She will need time to adjust to the new unit and people. Daughter is grateful she is here and she can visit. Substance Abuse History: No issues  Patient / Family Perceptions, Expectations & Goals Pt/Family understanding of illness & functional limitations: Pt can state she fell and broke her leg. Daughter talks with the MD and feels she has a good understanding of her Mom's fracture and treatment plan going forward. She plans to be here daily to see her in therapies. Premorbid pt/family roles/activities: mom, retiree, church member, etc Anticipated changes in roles/activities/participation: resume Pt/family expectations/goals: Pt states: " I want to go home soon."   Daughter states: " I hope she does well here and can be more mobile before she comes home."  [employer]: Other (Comment)(been to [facility] three times) Premorbid Home Care/DME Agencies: Other (Comment)(has rw, cane and tub seat) Transportation available at discharge: [place] Resource referrals recommended: Support group (specify)  Discharge  Planning Living Arrangements: Alone, Children Support Systems: Children, Water engineer, Social worker community Type of Residence: Private residence Insurance Resources: [employer], Multimedia programmer (specify)([employer]) Financial Resources: Radio broadcast assistant Screen Referred: No Living Expenses: Own Money Management: Family Does the patient have any problems obtaining your medications?: No Home Management: Daughter does home management-cooks and cleans Patient/Family Preliminary Plans: Plans to go to daughter';s home and stay fulltime. She would stay at night and then go to her home during the day but until she is healed she will be with daughter. Daughter and son in-law will assist her and daughter plans to be here daily to see her in therapies and provide support. Social Work Anticipated Follow Up Needs: HH/OP, Support Group  Clinical Impression Pleasantly confused female who is willing to go to therapies and try her best. She has difficulty hearing with the masks and shields on. Her daughter and son in-law were already assisting some prior to admission and will once discharged from rehab. Will await therapy evaluations and work on discharge needs.  Elease Hashimoto 06/28/2019, 1:27 PM

## 2019-06-29 ENCOUNTER — Inpatient Hospital Stay (HOSPITAL_COMMUNITY): Payer: MEDICARE

## 2019-06-29 ENCOUNTER — Inpatient Hospital Stay (HOSPITAL_COMMUNITY): Payer: MEDICARE | Admitting: Physical Therapy

## 2019-06-29 ENCOUNTER — Inpatient Hospital Stay (HOSPITAL_COMMUNITY): Payer: MEDICARE | Admitting: Speech Pathology

## 2019-06-29 DIAGNOSIS — G3109 Other frontotemporal dementia: Secondary | ICD-10-CM

## 2019-06-29 DIAGNOSIS — E876 Hypokalemia: Secondary | ICD-10-CM

## 2019-06-29 DIAGNOSIS — F0281 Dementia in other diseases classified elsewhere with behavioral disturbance: Secondary | ICD-10-CM

## 2019-06-29 LAB — URINE CULTURE: Culture: NO GROWTH

## 2019-06-29 NOTE — Progress Notes (Signed)
Occupational Therapy Session Note  Patient Details  Name: Michelle Gregory MRN: [id number] Date of Birth: [birth year]  Today's Date: 06/29/2019 OT Individual Time: [id number] OT Individual Time Calculation (min): 70 min    Short Term Goals: Week 1:  OT Short Term Goal 1 (Week 1): Pt will perform sit to stands with min cues for proper hand placement in prep for ADL OT Short Term Goal 2 (Week 1): Pt will perform toilet/ BSC transfers with RW with min guard OT Short Term Goal 3 (Week 1): Pt will perform toileting tasks with mod A OT Short Term Goal 4 (Week 1): Pt will don LB clothing with min A with AE prn  Skilled Therapeutic Interventions/Progress Updates:    Pt resting in bed upon arrival and pleasantly confused.  Pt initially thought she was in [place] but quickly oriented when corrected.  Pt with no clean clothing but agreeable to getting OOB and going up to sink.  Max A for bed mobility and mod A for sit<>stand from EOB.  Stand pivot transfers with RW at min A.  Pt incontinent of bowel when standing requiring tot A for hygiene.  Pt sat on BSC to complete bowel movement and empty bladder.  Pt required max A for threading pullups and pulling over hips.  Pt wears house dress.  Pt completed grooming tasks at sink. Pt sit<>stand X 6 with min/mod A. Pt requres to return to bed at end of session.  Min A for transfers and max A for sit>supine in bed. Pt' daughter arrived and requested she bring in slacks and short/bouse to wear when going down to gym. Pt remained in bed with bed alarm activated and daughter present.   Therapy Documentation Precautions:  Precautions Precautions: Fall Restrictions Weight Bearing Restrictions: Yes LLE Weight Bearing: Weight bearing as tolerated Pain:  Pt c/o increased pain with movement; med admin prior to therapy     Therapy/Group: Individual Therapy  Leroy Libman 06/29/2019, 11:28 AM

## 2019-06-29 NOTE — Progress Notes (Signed)
Speech Language Pathology Daily Session Note  Patient Details  Name: [name] MRN: [id number] Date of Birth: [birth year]  Today's Date: 06/29/2019 SLP Individual Time: 0830-0930 SLP Individual Time Calculation (min): 60 min  Short Term Goals: Week 1: SLP Short Term Goal 1 (Week 1): Pt recall basic biographical and/or daily information with Mod A for use of external aids. SLP Short Term Goal 2 (Week 1): Pt will orient to place, time, and situation with Mod A multimodal cues. SLP Short Term Goal 3 (Week 1): Pt will complete basic, familiar tasks with Mod A cues for problem solving. SLP Short Term Goal 4 (Week 1): Pt will demonstrate intellectual awareness by stating at least 1 physical and 1 cognitive deficit with Mod A cues.  Skilled Therapeutic Interventions:  Skilled treatment session focused on cognition goals. SLP facilitated session by providing gentle Max A cues for sustained attention to task d/t frequent off-topic historical attempts at conversation. Additionally, pt unable to retain basic 1 step directions during simple game. Pt was oriented to herself and place but demonstrated frequent confusion as she stated that her daughter was in her 7's and that pt resided with her mother in the evenings. Pt left upright in bed, bed alarm on and all needs within reach. Continue per current plan of care.      Pain    Therapy/Group: Individual Therapy  Katti Pelle 06/29/2019, 10:45 AM

## 2019-06-29 NOTE — Plan of Care (Addendum)
  Problem: RH BLADDER ELIMINATION Goal: RH STG MANAGE BLADDER WITH ASSISTANCE Description: STG Manage Bladder With Assistance Outcome: Not Progressing; incontinence;  Mushy stools;

## 2019-06-29 NOTE — Progress Notes (Signed)
Physical Therapy Session Note  Patient Details  Name: Michelle Gregory MRN: [id number] Date of Birth: Feb 19, 1923  Today's Date: 06/29/2019 PT Individual Time: [id number] PT Individual Time Calculation (min): 58 min   Short Term Goals: Week 1:  PT Short Term Goal 1 (Week 1): STG=LTG due to short ELOS.  Skilled Therapeutic Interventions/Progress Updates:    Pt received supine in bed with her daughter present and pt agreeable to therapy session. Pt reporting she is not agreeable to leave the room as she does not have pants to put on - offered paper clothes but pt deferred. Performed supine L LE hip abduction/adduction 2x10 reps with pt demonstrating ~30degrees of movement excursion limited by pain then performed heel slides 2x10 reps with assist for increased ROM - cuing throughout for increased muscle activation and increased ROM. Supine>sit, HOB half elevated and using bedrails, with mod assist for trunk upright and B LE management - cuing for increased patient independence and sequencing of task. Sit>stand EOB>RW with mod assist for lifting into standing when pt suddenly reports being incontinent of BM. Ambulated ~77ft to toilet using RW with mod assist for balance and cuing for proper AD management and increased safety - demonstrates sustained B LE hip/knee flexion with anterior trunk flexion, decreased B LE step length, and decreased gait speed. Standing with B UE support on RW and min assist for balance during total assist LB clothing management. Sit<>stand toilet<>RW with min assist for lowering and mod assist for lifting into standing - initially with significant L hip pain upon sitting on toilet but with assist for repositioning able to sit comfortably. Standing with UE support on grab bar and RW with CGA/min assist for balance during total assist peri-care. Ambulated ~42ft toilet>w/c using RW with mod assist for balance - continuing to demonstrate above gait impairments. Standing hand hygiene at sink  with min/mod assist for balance and pt continuing to demonstrate sustained hip/knee flexion in standing with trunk flexion and forearm support on sink. While sitting in w/c, HR 108-113bpm after performing the above activities - pt denies symptoms. Performed sit<>stand w/c<>RW with mod assist for lifting/lowering as pt demonstrating increased fatigue. Standing with B UE support on RW and mod assist for balance during x5 reps of L LE hip flexion. Pt's daughter requesting pt return to bed at end of session. Stand pivot w/c>EOB using RW with mod assist for lifting/lowering and balance. Lateral seated scooting on EOB with close supervision. Sit>supine with max assist for B LE management and trunk descent. Pt left supine in bed with needs in reach, bed alarm on, and her daughter present.   Therapy Documentation Precautions:  Precautions Precautions: Fall Restrictions Weight Bearing Restrictions: Yes LLE Weight Bearing: Weight bearing as tolerated  Pain: Reports L hip pain during exercises and weightbearing - RN reports pain medication administered prior to therapy session - provided seated rest breaks for pain management throughout session.   Therapy/Group: Individual Therapy  Tawana Scale, PT, DPT 06/29/2019, 12:58 PM

## 2019-06-29 NOTE — Progress Notes (Signed)
Patient c/o pins and needles sensation on right fingers claims "something not right." No c/o pain. Able to feed self.Continued to monitor.

## 2019-06-29 NOTE — Progress Notes (Signed)
Physical Therapy Session Note  Patient Details  Name: Michelle Gregory MRN: [id number] Date of Birth: 07-Dec-1922  Today's Date: 06/29/2019 PT Individual Time: [id number] PT Individual Time Calculation (min): 60 min   Short Term Goals: Week 1:  PT Short Term Goal 1 (Week 1): STG=LTG due to short ELOS.  Skilled Therapeutic Interventions/Progress Updates:   Pt received supine in bed and agreeable to PT. Supine>sit transfer with mod assist and moderate cues for LE positioning and proper use of bed features. Pt reports need for BM. Stand pivot transfer to [facility] with mod assist and moderate cues doe AD management to allow improved WB through BUE and manage pain in the L hip. Stand pivot trasnfer to toilet with mod A and moderate cues for sequecning and safety. Pt repeated attempting to manage brief without UE support on RW or rail in bathroom, regardless of instruction from PT. Pt noted to have small incontinent bowel movement. Pt able to void once on toilet. Sit<>stand x 2 with min-mod assist for pt and PT to perform pericare and clothing management. Continued unsafe use of AD while performing personal hygiene. Hand hygiene at sink, sitting in [facility] with supervision assist. PT instructed pt in gait training, 31f x 2 with youth height RW; improved AD management in turns, and decreased antalgic gait pattern compared to standard height RW. Pt returned to room and performed stand pivot transfer to bed with min A and RW. Sit>supine completed with min assist to manage the LLE, and left supine in bed with call bell in reach and all needs met.        Therapy Documentation Precautions:  Precautions Precautions: Fall Restrictions Weight Bearing Restrictions: Yes LLE Weight Bearing: Weight bearing as tolerated Pain: Pain Assessment Pain Score: 0-No pain     Therapy/Group: Individual Therapy  ALorie Phenix8/27/2020, 5:22 PM
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
## 2019-06-30 ENCOUNTER — Inpatient Hospital Stay (HOSPITAL_COMMUNITY): Payer: MEDICARE | Admitting: Occupational Therapy

## 2019-06-30 ENCOUNTER — Inpatient Hospital Stay (HOSPITAL_COMMUNITY): Payer: MEDICARE | Admitting: Physical Therapy

## 2019-06-30 ENCOUNTER — Inpatient Hospital Stay (HOSPITAL_COMMUNITY): Payer: MEDICARE | Admitting: Speech Pathology

## 2019-06-30 LAB — CBC WITH DIFFERENTIAL/PLATELET
Abs Immature Granulocytes: 0.04 10*3/uL (ref 0.00–0.07)
Basophils Absolute: 0 10*3/uL (ref 0.0–0.1)
Basophils Relative: 1 %
Eosinophils Absolute: 0.3 10*3/uL (ref 0.0–0.5)
Eosinophils Relative: 5 %
HCT: 28.4 % — ABNORMAL LOW (ref 36.0–46.0)
Hemoglobin: 9.3 g/dL — ABNORMAL LOW (ref 12.0–15.0)
Immature Granulocytes: 1 %
Lymphocytes Relative: 19 %
Lymphs Abs: 1.1 10*3/uL (ref 0.7–4.0)
MCH: 33.1 pg (ref 26.0–34.0)
MCHC: 32.7 g/dL (ref 30.0–36.0)
MCV: 101.1 fL — ABNORMAL HIGH (ref 80.0–100.0)
Monocytes Absolute: 0.5 10*3/uL (ref 0.1–1.0)
Monocytes Relative: 8 %
Neutro Abs: 3.9 10*3/uL (ref 1.7–7.7)
Neutrophils Relative %: 66 %
Platelets: 123 10*3/uL — ABNORMAL LOW (ref 150–400)
RBC: 2.81 MIL/uL — ABNORMAL LOW (ref 3.87–5.11)
RDW: 14.4 % (ref 11.5–15.5)
WBC: 5.8 10*3/uL (ref 4.0–10.5)
nRBC: 0 % (ref 0.0–0.2)

## 2019-06-30 LAB — COMPREHENSIVE METABOLIC PANEL
ALT: 12 U/L (ref 0–44)
AST: 17 U/L (ref 15–41)
Albumin: 2.6 g/dL — ABNORMAL LOW (ref 3.5–5.0)
Alkaline Phosphatase: 37 U/L — ABNORMAL LOW (ref 38–126)
Anion gap: 9 (ref 5–15)
BUN: 19 mg/dL (ref 8–23)
CO2: 27 mmol/L (ref 22–32)
Calcium: 8.5 mg/dL — ABNORMAL LOW (ref 8.9–10.3)
Chloride: 100 mmol/L (ref 98–111)
Creatinine, Ser: 0.76 mg/dL (ref 0.44–1.00)
GFR calc Af Amer: >60 mL/min (ref >60)
GFR calc non Af Amer: >60 mL/min (ref >60)
Glucose, Bld: 165 mg/dL — ABNORMAL HIGH (ref 70–99)
Potassium: 4.1 mmol/L (ref 3.5–5.1)
Sodium: 136 mmol/L (ref 135–145)
Total Bilirubin: 1.1 mg/dL (ref 0.3–1.2)
Total Protein: 4.8 g/dL — ABNORMAL LOW (ref 6.5–8.1)

## 2019-06-30 LAB — BASIC METABOLIC PANEL
Anion gap: 6 (ref 5–15)
BUN: 20 mg/dL (ref 8–23)
CO2: 29 mmol/L (ref 22–32)
Calcium: 8.5 mg/dL — ABNORMAL LOW (ref 8.9–10.3)
Chloride: 102 mmol/L (ref 98–111)
Creatinine, Ser: 0.6 mg/dL (ref 0.44–1.00)
GFR calc Af Amer: >60 mL/min (ref >60)
GFR calc non Af Amer: >60 mL/min (ref >60)
Glucose, Bld: 104 mg/dL — ABNORMAL HIGH (ref 70–99)
Potassium: 4.5 mmol/L (ref 3.5–5.1)
Sodium: 137 mmol/L (ref 135–145)

## 2019-06-30 MED ORDER — FOSFOMYCIN TROMETHAMINE 3 G PO PACK
3.0000 g | PACK | Freq: Once | ORAL | Status: AC
  Administered 2019-06-30: 3 g via ORAL
  Filled 2019-06-30: qty 3

## 2019-06-30 NOTE — Progress Notes (Signed)
Followed up on patient for recheck as reported to be feeling weak. Patient up in bed taking pills--appropriate and NAD. Denies any pain--didn't want any pain meds. Apparently walked to BR and back this am with ST and aide --required mod to total assist with them and apparently tired her out. ncisions Left thigh examined--C/D/I with skin glue. POD# 8 so left open to air.  Labs discussed with Dr. Dagoberto Ligas and hypokalemia resolved and CBC with recurrent mild drop in platelets. Will order vitals every 4 hours X 48 hrs for now--rule out UTI.

## 2019-06-30 NOTE — Progress Notes (Signed)
Physical Therapy Session Note  Patient Details  Name: Michelle Gregory MRN: [id number] Date of Birth: 02-07-1923  Today's Date: 06/30/2019 PT Individual Time: 1300-1355 PT Individual Time Calculation (min): 55 min   Short Term Goals: Week 1:  PT Short Term Goal 1 (Week 1): STG=LTG due to short ELOS.  Skilled Therapeutic Interventions/Progress Updates:      Pt received supine in bed and agreeable to PT. Supine>sit transfer with mod assist at trunk and the LLE, and cues for improved use of UE to push into sitting. Stand pivot transfer to [facility] with min assist and RW. Min cues for gait pattern and posture. Pt transferred to rehab gym in [facility]. Seated BLE therex: LAQ, heel slides, calf raises, hip abduction in pain free range. Each completed x 12 BLE with prolonged rest break following each bout. Gait training with RW 2 x 15 with min assist for safety. Pt noted to kep BLE in flexed posture, L>R, and required cues for AD management in turns. Sit<>stand x 6 throughout treatment with min-mod assist and moderate cues for set up to improve safety and pain control. Car transfer training with mod assist to pivot to edge of seat. Pt unable to bring LLE into car due to discomfort in L Hip with rotation into seat. Patient returned to room and left sitting in [facility] Dt with call bell in reach and all needs met.      Therapy Documentation Precautions:  Precautions Precautions: Fall Restrictions Weight Bearing Restrictions: Yes LLE Weight Bearing: Weight bearing as tolerated    Vital Signs: Therapy Vitals Temp: 98.6 F (37 C) Temp Source: Oral Pulse Rate: 92 Resp: 18 BP: 133/72 Patient Position (if appropriate): Lying Oxygen Therapy SpO2: 96 % O2 Device: Room Air Pain:   denies at rest   Therapy/Group: Individual Therapy  Lorie Phenix 06/30/2019, 2:08 PM

## 2019-06-30 NOTE — Progress Notes (Addendum)
Speech Language Pathology Daily Session Note  Patient Details  Name: KALLE WIEBOLD MRN: [id number] Date of Birth: 1923-09-08  Today's Date: 06/30/2019 SLP Individual Time: [id number]; [id number] SLP Individual Time Calculation (min): 59 min; 10 min  Short Term Goals: Week 1: SLP Short Term Goal 1 (Week 1): Pt recall basic biographical and/or daily information with Mod A for use of external aids. SLP Short Term Goal 2 (Week 1): Pt will orient to place, time, and situation with Mod A multimodal cues. SLP Short Term Goal 3 (Week 1): Pt will complete basic, familiar tasks with Mod A cues for problem solving. SLP Short Term Goal 4 (Week 1): Pt will demonstrate intellectual awareness by stating at least 1 physical and 1 cognitive deficit with Mod A cues.  Skilled Therapeutic Interventions:  Skilled treatment session focused on cognition goals. SLP facilitated session by providing orientation information to place and situation. Pt with continued confusion as she states that she had waited up last night for her "mother to come and she never did." Pt difficult to redirect to tasks during session d/t decreased mental flexibility. Pt stated that her hot tea was actually coffee and she hated coffee. SLP inspected and pt did have hot tea with tea bag in cup, however pt not receptive. SLP further facilitated with transfer to bathroom. Pt very inpatient and made attempts to get out of bed before NT was able to get into room. NT and SLP utilized rolling walker however pt was very distracted by self-directed ideas such as letting go of walker to fix bed. Pt required Mod A for sit to stand from bed and also from toilet. Pt unsafe while up ambulating with RW with this writer d/t inability to follow directions such as holding onto walker. Pt with no ability to learn new information this session. Pt left upright in bed, bed alarm on and all needs within reach.   Pt's daughter requested to speak with this Probation officer. Pt's  daughter with questions regarding rationale for "speech" therapy. Explanation and POC shared with daughter regarding cognition. Daughter reports significant confusion at baseline d/t dementia for example pt felt that daughter was her mother and pt frequently states that she has 2 daughters (the pt's actual daughter and then the pt confabulates a younger version of the actual daughter). Education provided to pt's daughter that new learning (such as need to use walker) is likely poor that this point in pt's recovery and that pt will need continuous supervision at discharge d/t lack of safety awareness as well as general dementia related impairments such as seeking people who are deceased. LTGs have been downgraded to reflect lack of progress.       Pain    Therapy/Group: Individual Therapy  Mykaela Arena 06/30/2019, 12:26 PM

## 2019-06-30 NOTE — Progress Notes (Signed)
[facility] PHYSICAL MEDICINE & REHABILITATION PROGRESS NOTE   Subjective/Complaints: Pt reports to "leave her alone"- she says she feels weak and doesn't want to do anymore therapy, at " least for a few days".  She refused to turn over for physician to check her hip incision- she said "do it later".   Checked CMP, CBC and U/A and Cx- U/A somewhat Positive with large Leuks, negative nitrites, and rare bacteria and >50 WBCs- will d/w PA based on her current behavior/temp, will possibly start ABX. Of note, her WBC is 5.8  Objective:   No results found. Recent Labs    06/28/19 0536 06/30/19 0921  WBC 8.1 5.8  HGB 9.2* 9.3*  HCT 28.1* 28.4*  PLT 150 123*   Recent Labs    06/30/19 0502 06/30/19 0921  NA 137 136  K 4.5 4.1  CL 102 100  CO2 29 27  GLUCOSE 104* 165*  BUN 20 19  CREATININE 0.60 0.76  CALCIUM 8.5* 8.5*    Intake/Output Summary (Last 24 hours) at 06/30/2019 1156 Last data filed at 06/30/2019 0745 Gross per 24 hour  Intake 440 ml  Output -  Net 440 ml     Physical Exam: Vital Signs Blood pressure (!) 146/87, pulse 99, temperature 97.7 F (36.5 C), temperature source Oral, resp. rate 17, height 5' (1.524 m), weight 52.9 kg, last menstrual period 11/02/1980, SpO2 96 %.  Physical Exam  labs and vitals reviewed. Constitutional: She appears well-developed. She appears cachectic. Appears frail,lying on L side in bed, refuses to turn, eyes closed, NAD no breakfast plate there- don't know if she ate NAD HENT:  Head: Normocephalic and atraumatic.  Eyes: EOM are normal. Wearing eyeglasses Neck: No tracheal deviation present. No thyromegaly present. Cardiac: RRR- nml rate- no M/R/G heard Respiratory: CTA B/L GI: soft, NT, ND, (+)BS- hypoactive  Musculoskeletal:     Comments: Left hip with edema and tenderness still Neurological: She is awake and alert, but Ox1-not sure where she is or the date/year. Just wouldn't guess Disoriented HOH Able to follow basic  commands without difficulty. Got upset when mentioned family  Motor: Bilateral upper extremities: 4/5 proximal distal Right lower extremity: 4-/5 hip flexion, knee extension, 4/5 ankle dorsiflexion Left lower extremity: 2/5 hip flexion, knee extension (pain inhibition), 3/5 ankle dorsiflexion  Skin: She is not diaphoretic.  Left hip incision C/D/I - dressing covering hip- no seepage seen Left proximal lower extremity with ecchymosis   Psychiatric: She has a normal mood and affect. Her behavior is normal for a confused pt.     Assessment/Plan: 1. Functional deficits secondary to L  Hip fracture s/p IM nail  which require 3+ hours per day of interdisciplinary therapy in a comprehensive inpatient rehab setting.  Physiatrist is providing close team supervision and 24 hour management of active medical problems listed below.  Physiatrist and rehab team continue to assess barriers to discharge/monitor patient progress toward functional and medical goals  Care Tool:  Bathing    Body parts bathed by patient: Right arm, Left arm, Chest, Abdomen, Right upper leg, Left upper leg, Face   Body parts bathed by helper: Front perineal area, Buttocks, Right lower leg, Left lower leg     Bathing assist Assist Level: Moderate Assistance - Patient 50 - 74%     Upper Body Dressing/Undressing Upper body dressing   What is the patient wearing?: Dress    Upper body assist Assist Level: Moderate Assistance - Patient 50 - 74%    Lower  Body Dressing/Undressing Lower body dressing      What is the patient wearing?: Underwear/pull up     Lower body assist Assist for lower body dressing: Total Assistance - Patient < 25%     Toileting Toileting    Toileting assist Assist for toileting: Total Assistance - Patient < 25%     Transfers Chair/bed transfer  Transfers assist     Chair/bed transfer assist level: Moderate Assistance - Patient 50 - 74% Chair/bed transfer assistive device:  Programmer, multimedia   Ambulation assist      Assist level: Moderate Assistance - Patient 50 - 74% Assistive device: Walker-rolling Max distance: 93ft   Walk 10 feet activity   Assist  Walk 10 feet activity did not occur: Safety/medical concerns(decreased strenght/activity tolerance and pain)  Assist level: Moderate Assistance - Patient - 50 - 74% Assistive device: Walker-rolling   Walk 50 feet activity   Assist Walk 50 feet with 2 turns activity did not occur: Safety/medical concerns(decreased strenght/activity tolerance and pain)         Walk 150 feet activity   Assist Walk 150 feet activity did not occur: Safety/medical concerns(decreased strenght/activity tolerance and pain)         Walk 10 feet on uneven surface  activity   Assist Walk 10 feet on uneven surfaces activity did not occur: Safety/medical concerns(decreased strenght/activity tolerance and pain)         Wheelchair     Assist Will patient use wheelchair at discharge?: Yes Type of Wheelchair: Manual    Wheelchair assist level: Moderate Assistance - Patient 50 - 74% Max wheelchair distance: 14'    Wheelchair 50 feet with 2 turns activity    Assist    Wheelchair 50 feet with 2 turns activity did not occur: Safety/medical concerns(decreased strenght/activity tolerance)       Wheelchair 150 feet activity     Assist  Wheelchair 150 feet activity did not occur: Safety/medical concerns(decreased strength/activity tolerance and pain)       Blood pressure (!) 146/87, pulse 99, temperature 97.7 F (36.5 C), temperature source Oral, resp. rate 17, height 5' (1.524 m), weight 52.9 kg, last menstrual period 11/02/1980, SpO2 96 %.  ASSESSMENT and PLAN:  1.  Deficits with mobility, self-care, delirium, agitation secondary to left hip fracture status post IM nail. WBAT on LLE             Admit to CIR 2.  Antithrombotics: -DVT/anticoagulation:  Pharmaceutical:  Lovenox             -antiplatelet therapy: ASA 3. Chronic LBP/Pain Management: Continue gabapentin bid with Norco prn             Monitor with increased mobility 4. Mood: LCSW to follow for evaluation and support.              -antipsychotic agents: Continue ativan prn for agitation/sleep 5. Neuropsych: This patient is not capable of making decisions on her own behalf. 6. Skin/Wound Care: Monitor wound daily for healing.  7. Fluids/Electrolytes/Nutrition: Monitor I/Os. Offer fluids frequently as getting dehydrated. BUN 22--->32 on last check.  BMP ordered for tomorrow a.m.  8/27- BUN/Cr OK 8/26- labs again Fri. 8. HTN: Continue Metoprolol bid and Cozaar daily.  Monitor with increased mobility..   9. Delirum: Question dementia worsened in hospital setting--per records has issues with confusion at nights and early am. Sleep wake chart. Anticipate worsening with change of setting.  Will add zyprexa prn at bedtime  10. Diarrhea?: Has history of C diff/colon cancer.  Discontinue Senokot.   8/26- having regular BMs- on miralax prn 11. ABLA: Continue to monitor with serial checks.  CBC ordered for tomorrow a.m. 12. Thrombocytopenia: Monitor with serial checks as on Lovenox. Monitor for signs of bleeding.  CBC ordered for tomorrow a.m. 13.  E coli UTI: Completed 5 days of rocephin --recheck urine culture for clearing due to ongoing delirium. Monitor WBC/temps for signs of infection.   14. Hypokalemia  8/26- will replete her K+ from 3.1 today- will give KCl x 2 doses 40 mEq and recheck BMP.  8/27- labs tomorrow  8/28- K+ improved 15. Hypocalcemia  8/26- Ca 7.8--replete 16. Possible UTI- 8/28- will try Fosfomycin, since allergic to Bactrim and PCN.   LOS: 3 days A FACE TO FACE EVALUATION WAS PERFORMED  Woodfin Kiss 06/30/2019, 11:56 AM

## 2019-06-30 NOTE — IPOC Note (Signed)
Overall Plan of Care [facility]) Patient Details Name: Michelle Gregory MRN: [id number] DOB: 06/28/1923  Admitting Diagnosis: <principal problem not specified>  Hospital Problems: Active Problems:   Closed displaced intertrochanteric fracture of left femur (HCC)   Pelvic fracture (HCC)   Subacute confusional state   Dementia with behavioral disturbance (HCC)   Hypokalemia     Functional Problem List: Nursing Bladder, Endurance, Pain, Safety  PT Balance, Edema, Endurance, Motor, Nutrition, Pain, Safety, Sensory, Skin Integrity  OT Balance, Sensory, Edema, Endurance, Motor, Pain, Safety  SLP Perception, Cognition  TR         Basic ADL's: OT Bathing, Dressing, Toileting     Advanced  ADL's: OT       Transfers: PT Bed Mobility, Bed to Chair, Car, [employer], Floor  OT Toilet, Metallurgist: PT Ambulation, Emergency planning/management officer, Stairs     Additional Impairments: OT None  SLP Social Cognition   Problem Solving, Memory, Awareness  TR      Anticipated Outcomes Item Anticipated Outcome  Self Feeding n/a  Swallowing      Basic self-care  supervision  Toileting  supervision   Bathroom Transfers supervision  Bowel/Bladder  Continent to bowel and bladder with mod. assist.  Transfers  CGA using LRAD  Locomotion  CGA using LRAD 50 ft  Communication     Cognition  Min A  Pain  Less than 3,on 1 to 10 score.  Safety/Judgment  Free from falls.   Therapy Plan: PT Intensity: Minimum of 1-2 x/day ,45 to 90 minutes PT Frequency: 5 out of 7 days PT Duration Estimated Length of Stay: 7-10 days OT Intensity: Minimum of 1-2 x/day, 45 to 90 minutes OT Frequency: 5 out of 7 days OT Duration/Estimated Length of Stay: ~10-12 days SLP Intensity: Minumum of 1-2 x/day, 30 to 90 minutes SLP Frequency: 3 to 5 out of 7 days SLP Duration/Estimated Length of Stay: 7-10 days   Due to the current state of emergency, patients may not be receiving their 3-hours of  Medicare-mandated therapy.   Team Interventions: Nursing Interventions Patient/Family Education, Skin Care/Wound Management, Pain Management  PT interventions Ambulation/gait training, Discharge planning, Functional mobility training, Psychosocial support, Therapeutic Activities, Visual/perceptual remediation/compensation, Balance/vestibular training, Disease management/prevention, Neuromuscular re-education, Skin care/wound management, Therapeutic Exercise, Wheelchair propulsion/positioning, Cognitive remediation/compensation, DME/adaptive equipment instruction, Pain management, Splinting/orthotics, UE/LE Strength taining/ROM, Community reintegration, Technical sales engineer stimulation, Patient/family education, IT trainer, UE/LE Coordination activities  OT Interventions Training and development officer, Disease mangement/prevention, Self Care/advanced ADL retraining, Therapeutic Exercise, Cognitive remediation/compensation, DME/adaptive equipment instruction, Pain management, Skin care/wound managment, UE/LE Strength taining/ROM, Community reintegration, Barrister's clerk education, UE/LE Coordination activities, Discharge planning, Functional mobility training, Psychosocial support, Therapeutic Activities  SLP Interventions Cognitive remediation/compensation, English as a second language teacher, Environmental controls, Internal/external aids, Functional tasks, Patient/family education, Therapeutic Activities  TR Interventions    SW/CM Interventions Discharge Planning, Psychosocial Support, Patient/Family Education   Barriers to Discharge MD  Medical stability, Incontinence, Lack of/limited family support, Weight, Medication compliance, Behavior and dementia  Nursing      PT Home environment access/layout 5 STE a 1 level home, need to confirm home set up and daughters ability to assist patient at d/c  OT      SLP      SW       Team Discharge Planning: Destination: PT-Home ,OT- Home , SLP-Home Projected Follow-up:  PT-Home health PT, OT-  Home health OT, SLP-Home Health SLP, 24 hour supervision/assistance Projected Equipment Needs: PT- , OT- To be determined, SLP-None recommended by SLP  Equipment Details: PT-Patient reports she has a RW and transport chair at home. Need to confirm as patient is a poor historian, OT-  Patient/family involved in discharge planning: PT- Patient,  OT-Patient, Family member/caregiver, SLP-Patient unable/family or caregive not available  MD ELOS: 7-10 days Medical Rehab Prognosis:  Fair Assessment: Pt is a 85 yr old female with L intertrochanteric femur/hip fracture s/p IM nail WBAT who also has dementia/poor memory, additional confusion since not in home setting, frailty, and possible UTI - c/o feeling weak on 8/28- work up in progress. She's taking pain meds prn for L hip and incision is healing, slowly.     See Team Conference Notes for weekly updates to the plan of care

## 2019-06-30 NOTE — Plan of Care (Signed)
  Problem: RH Cognition - SLP Goal: RH LTG Patient will demonstrate orientation with cues Description:  LTG:  Patient will demonstrate orientation to person/place/time/situation with cues (SLP)   Flowsheets (Taken 06/30/2019 1227) LTG Patient will demonstrate orientation to:  Place  Time  Situation LTG: Patient will demonstrate orientation using cueing (SLP): Maximal Assistance - Patient 25 - 49% Note: Downgraded d/t lack of progress    Problem: RH Problem Solving Goal: LTG Patient will demonstrate problem solving for (SLP) Description: LTG:  Patient will demonstrate problem solving for basic/complex daily situations with cues  (SLP) Flowsheets (Taken 06/30/2019 1227) LTG: Patient will demonstrate problem solving for (SLP): Basic daily situations LTG Patient will demonstrate problem solving for: Maximal Assistance - Patient 25 - 49%   Problem: RH Memory Goal: LTG Patient will follow step by step directions w/cues (SLP) Description: LTG: Patient will follow step by step directions with cues (SLP). Flowsheets (Taken 06/30/2019 1227) LTG: Patient will follow step by step directions: 1 step LTG: Patient will follow step by step directions w/cues: Maximal Assistance - Patient 25 - 49% Note: Downgraded d/t lack of progress Goal: LTG Patient will use memory compensatory aids to (SLP) Description: LTG:  Patient will use memory compensatory aids to recall biographical/new, daily complex information with cues (SLP) Flowsheets (Taken 06/30/2019 1227) LTG: Patient will use memory compensatory aids to (SLP): Maximal Assistance - Patient 25 - 49% Note: Downgraded d/t lack of progress

## 2019-06-30 NOTE — Progress Notes (Signed)
Occupational Therapy Session Note  Patient Details  Name: Michelle Gregory MRN: [id number] Date of Birth: Mar 17, 1923  Today's Date: 06/30/2019 OT Individual Time: 1002-1058 OT Individual Time Calculation (min): 56 min   Short Term Goals: Week 1:  OT Short Term Goal 1 (Week 1): Pt will perform sit to stands with min cues for proper hand placement in prep for ADL OT Short Term Goal 2 (Week 1): Pt will perform toilet/ BSC transfers with RW with min guard OT Short Term Goal 3 (Week 1): Pt will perform toileting tasks with mod A OT Short Term Goal 4 (Week 1): Pt will don LB clothing with min A with AE prn  Skilled Therapeutic Interventions/Progress Updates:    Pt greeted in bed and reported she had just received medication for pelvic pain. Initially refusing therapy due to feeling "wore out." After OT established rapport with pt, she was agreeable to get OOB to complete some grooming tasks at the sink. Supine<sit completed with Mod A for guiding L LE. Min A for sit<stand and for ambulation to w/c placed at sink using RW. She wanted to sit vs stand to complete oral care, face washing, and hand washing. Two rest breaks and Min A required to meet demands of stated tasks. Afterwards pt ambulated back to bed and returned to supine with Min-Mod A for L LE. She was repositioned for comfort with L LE elevated. Pt left with all needs within reach and bed alarm set. Dtr arrived at end of tx, reporting she brought blouses and slacks for pt to wear during therapies. Tx focus placed on OOB tolerance, functional ambulation, and self care retraining.   Pt needed A with orientation today, stating she thought she was in "[place]" vs hospital, disoriented to time and general situation as well. OT provided this education for her.   Therapy Documentation Precautions:  Precautions Precautions: Fall Restrictions Weight Bearing Restrictions: Yes LLE Weight Bearing: Weight bearing as tolerated Vital Signs: Therapy  Vitals Pulse Rate: 99 Resp: 17 BP: (!) 146/87 Patient Position (if appropriate): Sitting Oxygen Therapy O2 Device: Room Air Pain: Pt declined for OT to supply cryotherapy at end of tx, stating she was ok with just the pain medicine she had taken.    ADL: ADL Eating: Set up Where Assessed-Eating: Wheelchair Grooming: Setup Where Assessed-Grooming: Wheelchair Upper Body Bathing: Setup Where Assessed-Upper Body Bathing: Shower Lower Body Bathing: Maximal assistance Where Assessed-Lower Body Bathing: Shower Upper Body Dressing: Moderate assistance Where Assessed-Upper Body Dressing: Wheelchair Lower Body Dressing: Maximal assistance Where Assessed-Lower Body Dressing: Wheelchair Toileting: Maximal assistance Where Assessed-Toileting: Glass blower/designer: Moderate assistance Toilet Transfer Method: Stand pivot Toilet Transfer Equipment: Energy manager: Moderate assistance Social research officer, government Method: Radiographer, therapeutic: Shower seat without back, Transfer tub bench      Therapy/Group: Individual Therapy  Huy Majid A Robben Jagiello 06/30/2019, 12:24 PM

## 2019-06-30 NOTE — Plan of Care (Signed)
  Problem: RH BLADDER ELIMINATION Goal: RH STG MANAGE BLADDER WITH ASSISTANCE Description: STG Manage Bladder With Assistance Outcome: Progressing   Problem: RH SKIN INTEGRITY Goal: RH STG MAINTAIN SKIN INTEGRITY WITH ASSISTANCE Description: STG Maintain Skin Integrity With Mod.Assistance. Outcome: Progressing   Problem: RH SAFETY Goal: RH STG ADHERE TO SAFETY PRECAUTIONS W/ASSISTANCE/DEVICE Description: STG Adhere to Safety Precautions With Mod.Assistance/Device. Outcome: Progressing   Problem: RH PAIN MANAGEMENT Goal: RH STG PAIN MANAGED AT OR BELOW PT'S PAIN GOAL Description: Less than 3 Outcome: Progressing

## 2019-07-01 ENCOUNTER — Inpatient Hospital Stay (HOSPITAL_COMMUNITY): Payer: MEDICARE

## 2019-07-01 DIAGNOSIS — A499 Bacterial infection, unspecified: Secondary | ICD-10-CM

## 2019-07-01 DIAGNOSIS — N390 Urinary tract infection, site not specified: Secondary | ICD-10-CM

## 2019-07-01 DIAGNOSIS — S72142S Displaced intertrochanteric fracture of left femur, sequela: Secondary | ICD-10-CM

## 2019-07-01 LAB — URINALYSIS, ROUTINE W REFLEX MICROSCOPIC
Bilirubin Urine: NEGATIVE
Glucose, UA: NEGATIVE mg/dL
Ketones, ur: NEGATIVE mg/dL
Nitrite: NEGATIVE
Protein, ur: NEGATIVE mg/dL
Specific Gravity, Urine: 1.014 (ref 1.005–1.030)
pH: 6 (ref 5.0–8.0)

## 2019-07-01 MED ORDER — ALLOPURINOL 100 MG PO TABS: 100.0000 mg | ORAL_TABLET | Freq: Two times a day (BID) | ORAL | Status: DC

## 2019-07-01 MED ORDER — PSYLLIUM 95 % PO PACK
1.0000 | PACK | Freq: Every day | ORAL | Status: DC
  Administered 2019-07-01 – 2019-07-08 (×8): 1 via ORAL
  Filled 2019-07-01 (×8): qty 1

## 2019-07-01 MED ORDER — HYDROCODONE-ACETAMINOPHEN 5-325 MG PO TABS
1.0000 | ORAL_TABLET | Freq: Three times a day (TID) | ORAL | Status: DC | PRN
  Administered 2019-07-02 – 2019-07-04 (×4): 1 via ORAL
  Filled 2019-07-01 (×4): qty 1

## 2019-07-01 MED ORDER — SACCHAROMYCES BOULARDII 250 MG PO CAPS
250.0000 mg | ORAL_CAPSULE | Freq: Two times a day (BID) | ORAL | Status: DC
  Administered 2019-07-01 – 2019-07-08 (×14): 250 mg via ORAL
  Filled 2019-07-01 (×14): qty 1

## 2019-07-01 NOTE — Progress Notes (Signed)
BP 94/52, HR 105. No signs of distress. MD Naaman [name] notified. Order to hold Cozaar and continue metoprolol. Continue to monitor

## 2019-07-01 NOTE — Plan of Care (Signed)
  Problem: Consults Goal: Skin Care Protocol Initiated - if Braden Score 18 or less Description: If consults are not indicated, leave blank or document N/A Outcome: Progressing   Problem: RH BOWEL ELIMINATION Goal: RH STG MANAGE BOWEL WITH ASSISTANCE Description: STG Manage Bowel with Min.Assistance. Outcome: Progressing   Problem: RH BLADDER ELIMINATION Goal: RH STG MANAGE BLADDER WITH ASSISTANCE Description: STG Manage Bladder With Assistance Outcome: Progressing   Problem: RH SKIN INTEGRITY Goal: RH STG SKIN FREE OF INFECTION/BREAKDOWN Description: With Mod. Assist  Outcome: Progressing Goal: RH STG MAINTAIN SKIN INTEGRITY WITH ASSISTANCE Description: STG Maintain Skin Integrity With Mod.Assistance. Outcome: Progressing Goal: RH STG ABLE TO PERFORM INCISION/WOUND CARE W/ASSISTANCE Description: STG Able To Perform Incision/Wound Care With Min.Assistance. Outcome: Progressing   Problem: RH SAFETY Goal: RH STG ADHERE TO SAFETY PRECAUTIONS W/ASSISTANCE/DEVICE Description: STG Adhere to Safety Precautions With Mod.Assistance/Device. Outcome: Progressing   Problem: RH PAIN MANAGEMENT Goal: RH STG PAIN MANAGED AT OR BELOW PT'S PAIN GOAL Description: Less than 3 Outcome: Progressing   Problem: Consults Goal: RH GENERAL PATIENT EDUCATION Description: See Patient Education module for education specifics. Outcome: Progressing

## 2019-07-01 NOTE — Progress Notes (Signed)
[place] PHYSICAL MEDICINE & REHABILITATION PROGRESS NOTE   Subjective/Complaints: Pt up on [facility] with RN> feels that stool has been loose. RN says it's a little mushy. Pain seems controlled. Intermittent confusion  ROS: Patient denies fever, rash, sore throat, blurred vision, nausea, vomiting, diarrhea, cough, shortness of breath or chest pain,  headache, or mood change.   Objective:   No results found. Recent Labs    06/30/19 0921  WBC 5.8  HGB 9.3*  HCT 28.4*  PLT 123*   Recent Labs    06/30/19 0502 06/30/19 0921  NA 137 136  K 4.5 4.1  CL 102 100  CO2 29 27  GLUCOSE 104* 165*  BUN 20 19  CREATININE 0.60 0.76  CALCIUM 8.5* 8.5*    Intake/Output Summary (Last 24 hours) at 07/01/2019 0800 Last data filed at 07/01/2019 0757 Gross per 24 hour  Intake 600 ml  Output 350 ml  Net 250 ml     Physical Exam: Vital Signs Blood pressure (!) 94/52, pulse (!) 105, temperature 98 F (36.7 C), temperature source Oral, resp. rate 16, height 5' (1.524 m), weight 52.9 kg, last menstrual period 11/02/1980, SpO2 98 %.  Physical Exam  Constitutional: No distress . Vital signs reviewed. HEENT: EOMI, oral membranes moist Neck: supple Cardiovascular: RRR without murmur. No JVD    Respiratory: CTA Bilaterally without wheezes or rales. Normal effort    GI: BS +, non-tender, non-distended  Musculoskeletal:     Comments: Left hip with edema and tenderness still Neurological: alert. Oriented to self and hospital  [facility] Motor: Bilateral upper extremities: 4/5 proximal distal Right lower extremity: 4-/5 hip flexion, knee extension, 4/5 ankle dorsiflexion Left lower extremity: 2/5 hip flexion, knee extension (pain inhibition), 3/5 ankle dorsiflexion  Skin: She is not diaphoretic.  Left hip incision C/D/I - dressing in place Left proximal lower extremity with ecchymosis   Psychiatric: She has a normal mood and affect. Her behavior is normal for a confused  pt.     Assessment/Plan: 1. Functional deficits secondary to L  Hip fracture s/p IM nail  which require 3+ hours per day of interdisciplinary therapy in a comprehensive inpatient rehab setting.  Physiatrist is providing close team supervision and 24 hour management of active medical problems listed below.  Physiatrist and rehab team continue to assess barriers to discharge/monitor patient progress toward functional and medical goals  Care Tool:  Bathing    Body parts bathed by patient: Right arm, Left arm, Chest, Abdomen, Right upper leg, Left upper leg, Face   Body parts bathed by helper: Front perineal area, Buttocks, Right lower leg, Left lower leg     Bathing assist Assist Level: Moderate Assistance - Patient 50 - 74%     Upper Body Dressing/Undressing Upper body dressing   What is the patient wearing?: Dress    Upper body assist Assist Level: Moderate Assistance - Patient 50 - 74%    Lower Body Dressing/Undressing Lower body dressing      What is the patient wearing?: Underwear/pull up     Lower body assist Assist for lower body dressing: Total Assistance - Patient < 25%     Toileting Toileting    Toileting assist Assist for toileting: Total Assistance - Patient < 25%     Transfers Chair/bed transfer  Transfers assist     Chair/bed transfer assist level: Moderate Assistance - Patient 50 - 74% Chair/bed transfer assistive device: Programmer, multimedia   Ambulation assist  Assist level: Moderate Assistance - Patient 50 - 74% Assistive device: Walker-rolling Max distance: 5ft   Walk 10 feet activity   Assist  Walk 10 feet activity did not occur: Safety/medical concerns(decreased strenght/activity tolerance and pain)  Assist level: Moderate Assistance - Patient - 50 - 74% Assistive device: Walker-rolling   Walk 50 feet activity   Assist Walk 50 feet with 2 turns activity did not occur: Safety/medical concerns(decreased  strenght/activity tolerance and pain)         Walk 150 feet activity   Assist Walk 150 feet activity did not occur: Safety/medical concerns(decreased strenght/activity tolerance and pain)         Walk 10 feet on uneven surface  activity   Assist Walk 10 feet on uneven surfaces activity did not occur: Safety/medical concerns(decreased strenght/activity tolerance and pain)         Wheelchair     Assist Will patient use wheelchair at discharge?: Yes Type of Wheelchair: Manual    Wheelchair assist level: Moderate Assistance - Patient 50 - 74% Max wheelchair distance: 15'    Wheelchair 50 feet with 2 turns activity    Assist    Wheelchair 50 feet with 2 turns activity did not occur: Safety/medical concerns(decreased strenght/activity tolerance)       Wheelchair 150 feet activity     Assist  Wheelchair 150 feet activity did not occur: Safety/medical concerns(decreased strength/activity tolerance and pain)       Blood pressure (!) 94/52, pulse (!) 105, temperature 98 F (36.7 C), temperature source Oral, resp. rate 16, height 5' (1.524 m), weight 52.9 kg, last menstrual period 11/02/1980, SpO2 98 %.  ASSESSMENT and PLAN:  1.  Deficits with mobility, self-care, delirium, agitation secondary to left hip fracture status post IM nail. WBAT on LLE             -Continue CIR therapies including PT, OT  2.  Antithrombotics: -DVT/anticoagulation:  Pharmaceutical: Lovenox             -antiplatelet therapy: ASA 3. Chronic LBP/Pain Management: Continue gabapentin bid with Norco prn             reduce norco to 5/325 given AMS (home dose)  -may need to hold all together 4. Mood: LCSW to follow for evaluation and support.              -antipsychotic agents: Continue ativan prn for agitation/sleep  -limit given confusion/age 85. Neuropsych: This patient is not capable of making decisions on her own behalf. 6. Skin/Wound Care: Monitor wound daily for healing.  7.  Fluids/Electrolytes/Nutrition: Monitor I/Os. Offer fluids frequently as getting dehydrated. BUN 22--->32 on last check.  BMP ordered for tomorrow a.m.  8/27- BUN/Cr OK 8/26- labs again Fri. 8. HTN: Continue Metoprolol bid and Cozaar daily.  Monitor with increased mobility..   9. Delirum: Question dementia worsened in hospital setting--per records has issues with confusion at nights and early am. Sleep wake chart. Anticipate worsening with change of setting.    - zyprexa prn at bedtime    10. Diarrhea?: Has history of C diff/colon cancer.  Discontinue Senokot.   8/26- having regular BMs- on miralax prn 11. ABLA: Continue to monitor with serial checks.  CBC ordered for tomorrow a.m. 12. Thrombocytopenia: Monitor with serial checks as on Lovenox. Monitor for signs of bleeding.     -platelets 123 8/28  -recheck Monday. 13.  E coli UTI: Completed 5 days of rocephin --recheck urine culture for clearing due to ongoing delirium.  Monitor WBC/temps for signs of infection.   14. Hypokalemia  8/26- will replete her K+ from 3.1 today- will give KCl x 2 doses 40 mEq and recheck BMP.   8/28- K+ improved 15. Hypocalcemia  8/26- Ca 7.8--replete 16. Possible UTI- UA suspicious  -ucs pending from 8/28  -Fosfomycin  X 1 on 8/28 since allergic to Bactrim and PCN.   LOS: 4 days A FACE TO [place] 07/01/2019, 8:00 AM

## 2019-07-01 NOTE — Progress Notes (Signed)
Occupational Therapy Session Note  Patient Details  Name: Michelle Gregory MRN: [id number] Date of Birth: 13-Dec-1922  Today's Date: 07/01/2019 OT Individual Time: 1300-1415 OT Individual Time Calculation (min): 75 min    Short Term Goals: Week 1:  OT Short Term Goal 1 (Week 1): Pt will perform sit to stands with min cues for proper hand placement in prep for ADL OT Short Term Goal 2 (Week 1): Pt will perform toilet/ BSC transfers with RW with min guard OT Short Term Goal 3 (Week 1): Pt will perform toileting tasks with mod A OT Short Term Goal 4 (Week 1): Pt will don LB clothing with min A with AE prn  Skilled Therapeutic Interventions/Progress Updates:    Pt received sitting up in w/c with daughter present. Pt completed ambulatory transfer into bathroom with RW, requiring mod A to transfer sit > stand. Pt completed clothing management in standing with min A. Pt had incontinent BM and then continent on BSC. Pt completed peri hygiene initially but OT took over to assist with cleanliness 2/2 mess. Pt donned new depends with mod A, having to remove shoes and pants. Pt completed ambulatory transfer back to w/c with min A. Pt was transported to the therapy gym with total A for time management. Pt completed functional stepping task with unilateral stance to promote increase hip flexion and even weightbearing through BLE. Pt c/o R hand weakness/tingling, carpal tunnel test administered with negative result, but other clinical indicators present. Pt also demonstrated clinical signs of trigger finger in her L 3rd digit. Pt completed step up and down onto 4 in step with cueing for leading with R foot up and L foot down 3x 8 repetitions. Min A provided throughout for balance support. Pt completed 50 ft of functional mobility to her w/c, CGA provided throughout, as well as min cueing for RW management. Pt was transported back to room and she completed stand pivot transfer back to bed with CGA. Pt was left supine  with her daughter present and all needs met.   Therapy Documentation Precautions:  Precautions Precautions: Fall Restrictions Weight Bearing Restrictions: Yes LLE Weight Bearing: Weight bearing as tolerated   Therapy/Group: Individual Therapy  Curtis Sites 07/01/2019, 7:25 AM

## 2019-07-01 NOTE — Progress Notes (Signed)
Patient continuing to be incontinent of stool-loose. Patient denies any abdominal pain, no fever noted this evening. Per family patient was continent at home. MD Naaman [name] notified. New orders received. Continue to monitor.

## 2019-07-01 NOTE — Progress Notes (Addendum)
Physical Therapy Session Note  Patient Details  Name: Michelle Gregory MRN: [id number] Date of Birth: [birth year]  Today's Date: 07/01/2019 PT Individual Time: 0915-1030 and [id number] PT Individual Time Calculation (min): 75 min and 30 min  Short Term Goals: Week 1:  PT Short Term Goal 1 (Week 1): STG=LTG due to short ELOS.  Skilled Therapeutic Interventions/Progress Updates:     Session 1: Patient in bed upon PT arrival. Patient alert and agreeable to PT session. Patient expressed concern about bowl incontinence during her stay, stated that she has never has this before and did not understand why she did not know when she needed to go to the bathroom. PT provided general education on medication side effects and informed RN about patient's concerns.   Therapeutic Activity: Bed Mobility: Patient performed supine to sit with min A for trunk support. Provided verbal cues for moving B LEs off the edge of the bed and pushing throughout her elbows to sit up. Transfers: Patient performed sit to/from stand x4 with min A-CGA using the RW and a toilet transfer x1 with min A using the grab bar and RW. Provided verbal cues for hand placement on RW, leaning forward to stand, reaching back to sit, and having L LE slightly forward to off weight that leg for pain management. Required total A for peri-care and total A for LB dressing to don a new brief during toileting. Patient donned a new shirt sitting in the w/c with mod A to place arms through the sleeves and total A for buttoning the buttons on the shirt. She performed LB dressing to don pants in sitting with total A for threading her L LE and max A for R LE due to decreased forward flexion in sitting due to L hip pain, and mod A for pulling pants up in standing.    Patient washed her hands and brushed her teeth seated in the w/c with set-up A from therapist during session.   Gait Training:  Patient ambulated 15 feet x2 to and from the bathroom using RW with  CGA. Ambulated with significant L hip internal rotation, decreased gait speed, decreased step length, increased hip and knee flexion, forward trunk lean, and downward head gaze. Provided verbal cues for erect posture, increased B knee extension, sequencing for step-to gait pattern leading with the L, and staying in the middle of the RW. Patient went up/down 8 3" steps using B rails with min A, HR 112 SPO2 95% after. Provided demonstration and cues for leading with the R when ascending and L when descending and placing foot far enough forward onto the next step when descending, as patient reported having difficulty seeing where to place her foot.   Wheelchair Mobility:  Patient was transported with total A in the w/c during session for energy conservation and time management.  Patient in w/c at end of session with breaks locked, seat belt alarm set, and all needs within reach.   Session 2: Patient in bed with her daughter, Michelle Gregory, at bedside upon PT arrival. Patient alert and agreeable to PT session.  Patient reported 5-6/10 L hip pain and denied pain meds initially due to fear of "getting hooked on them." PT educated on benefits of taking pain medicine in order to manage pain and participate more in therapies. Patient stated understanding and requested to receive pain medicine following PT session, RN made aware.  Discussed home set up, ELOS, and POC with patient's daughter. Her daughter states that there is a very  large step to enter her home followed by one smaller step. Previously the patient's son-in-law has carried her up this step. Discussed options of using a step stool or putting in an additional step for the patient to use to get into the home. The patient and he daughter expressed concerns about the patient's incontinence during her stay. PT asked about any medications that have caused this before and the daughter stated Tylenol upsets her stomach. RN made aware and agreed to discuss medications  with the patient and he daughter.   Therapeutic Exercise: Patient performed the following exercises in supine with verbal and tactile cues for proper technique. -L heel slides 2x10 -L hip abduction 2x10 -L hip external rotation x5  Patient remains in internal rotation at the L hip in supine. Educated on performing external rotation in the bed and positioned L LE in neutral with a pillow at end of session.   Patient in bed with her daughter at bedside at end of session with breaks locked, bed alarm set, and all needs within reach.    Therapy Documentation Precautions:  Precautions Precautions: Fall Restrictions Weight Bearing Restrictions: Yes LLE Weight Bearing: Weight bearing as tolerated   Therapy/Group: Individual Therapy  Michelle Gregory L Taline Nass PT, DPT  07/01/2019, 12:35 PM

## 2019-07-02 ENCOUNTER — Inpatient Hospital Stay (HOSPITAL_COMMUNITY): Payer: MEDICARE | Admitting: Physical Therapy

## 2019-07-02 LAB — URINE CULTURE: Culture: <10000 — AB

## 2019-07-02 MED ORDER — LOSARTAN POTASSIUM 50 MG PO TABS
50.0000 mg | ORAL_TABLET | Freq: Every day | ORAL | Status: DC
  Administered 2019-07-02 – 2019-07-08 (×7): 50 mg via ORAL
  Filled 2019-07-02 (×6): qty 1

## 2019-07-02 NOTE — Progress Notes (Signed)
Physical Therapy Session Note  Patient Details  Name: AYRIN PIZZIMENTI MRN: [id number] Date of Birth: 02-Dec-1922  Today's Date: 07/02/2019 PT Individual Time: 1156-1221 PT Individual Time Calculation (min): 25 min   Short Term Goals: Week 1:  PT Short Term Goal 1 (Week 1): STG=LTG due to short ELOS.  Skilled Therapeutic Interventions/Progress Updates:  Pt OOB to recliner. Pt ready to get back in bed. Pt transferred sit to stand with rolling walker and min A with verbal cues. Pt ambulated about 10 feet with rolling walker and min A with verbal cues. Pt transferred edge of bed to supine with mod A and verbal cues. Pt moved up in bed with max A. Performed L LE AAROM, 2 sets x 10 reps each, heel slides, hip abd/add and SAQs. Pt left sitting up in bed with call bell within reach and bed alarm on.    Therapy Documentation Precautions:  Precautions Precautions: Fall Restrictions Weight Bearing Restrictions: Yes LLE Weight Bearing: Weight bearing as tolerated General:   Pain: Pt c/o mod pain L hip.   Therapy/Group: Individual Therapy  Dub Amis 07/02/2019, 12:48 PM

## 2019-07-02 NOTE — Progress Notes (Signed)
Patient's MEWS score was a yellow due to tachycardia. Patient has been tachycardic since admission & is on metoprolol. She was just given her night dose a few minutes ago. No signs of distress noted. Will repeat vitals as per the MEWS protocol in 2 hrs.

## 2019-07-02 NOTE — Plan of Care (Signed)
  Problem: Consults Goal: Skin Care Protocol Initiated - if Braden Score 18 or less Description: If consults are not indicated, leave blank or document N/A Outcome: Progressing   Problem: RH BOWEL ELIMINATION Goal: RH STG MANAGE BOWEL WITH ASSISTANCE Description: STG Manage Bowel with Min.Assistance. Outcome: Progressing   Problem: RH BLADDER ELIMINATION Goal: RH STG MANAGE BLADDER WITH ASSISTANCE Description: STG Manage Bladder With min Assistance Outcome: Progressing   Problem: RH SKIN INTEGRITY Goal: RH STG SKIN FREE OF INFECTION/BREAKDOWN Description: With Mod. Assist  Outcome: Progressing Goal: RH STG MAINTAIN SKIN INTEGRITY WITH ASSISTANCE Description: STG Maintain Skin Integrity With Mod.Assistance. Outcome: Progressing Goal: RH STG ABLE TO PERFORM INCISION/WOUND CARE W/ASSISTANCE Description: STG Able To Perform Incision/Wound Care With Min.Assistance. Outcome: Progressing   Problem: RH SAFETY Goal: RH STG ADHERE TO SAFETY PRECAUTIONS W/ASSISTANCE/DEVICE Description: STG Adhere to Safety Precautions With Mod.Assistance/Device. Outcome: Progressing   Problem: RH PAIN MANAGEMENT Goal: RH STG PAIN MANAGED AT OR BELOW PT'S PAIN GOAL Description: Less than 3 Outcome: Progressing   Problem: Consults Goal: RH GENERAL PATIENT EDUCATION Description: See Patient Education module for education specifics. Outcome: Progressing

## 2019-07-02 NOTE — Progress Notes (Signed)
Physical Therapy Session Note  Patient Details  Name: Michelle Gregory MRN: [id number] Date of Birth: 09/04/1923  Today's Date: 07/02/2019 PT Individual Time: 0904-1024 PT Individual Time Calculation (min): 80 min   Short Term Goals: Week 1:  PT Short Term Goal 1 (Week 1): STG=LTG due to short ELOS.  Skilled Therapeutic Interventions/Progress Updates:   Pt received in bed reporting her LLE feels "hot". Also noted to be significantly internally rotated at rest.  Pt able to externally rotate and PT and RN inspected each incision-warm to touch but no redness or significant edema.  Not tender to touch.  After pt received medication therapist assisted pt with gentle AAROM to LLE to prepare LLE for movement OOB.  Performed 10 reps each: L hip ER, hip ABD and hip flexion with therapist providing assistance under heel.  Performed supine > sit with rail with supervision and verbal cues.  Assisted pt with donning shoes and performed sit > stand from bed with min A and ambulated to sink with RW with min A.  Pt able to perform static standing at sink with one UE support while performing oral care with PT providing min A for balance.    In the gym reviewed exercises pt could perform at home at counter top for strengthening, weight shifting and WB through LLE.  Pt performed 10 reps of each with bilat UE support on tall table: heel raises, staggered stance toe raises, alternating hip flexion marching, hip ABD and hip extension with 2 seated rest breaks due to fatigue and feeling lightheaded.  BP assessed once seated with some decline in diastolic BP noted.  Following standing LE exercises transitioned to standing in // bars and performing 3 reps step ups and step down on 6" step to begin to work towards stepping up a larger step for home entry with bilat UE support and min-mod A from PT.  Required verbal cues for safe sequence and weight shift to LLE.   Returned to room and pt left with CNA to assist with  toileting.  Therapy Documentation Precautions:  Precautions Precautions: Fall Restrictions Weight Bearing Restrictions: Yes LLE Weight Bearing: Weight bearing as tolerated General:   Vital Signs: Therapy Vitals Temp: 98.3 F (36.8 C) Temp Source: Oral Pulse Rate: (!) 105 Resp: 16 BP: (!) 105/51 Patient Position (if appropriate): Lying Oxygen Therapy SpO2: 97 % O2 Device: Room Air Pain: Pain Assessment Pain Scale: 0-10 Pain Score: 5  Pain Type: Acute pain Pain Location: Hip Pain Orientation: Left Pain Radiating Towards: back Pain Descriptors / Indicators: Aching;Sore Pain Onset: Gradual Pain Intervention(s): Medication (See eMAR);Repositioned     Therapy/Group: Individual Therapy   Rico Junker, PT, DPT 07/02/19    3:01 PM    07/02/2019, 3:01 PM

## 2019-07-02 NOTE — Progress Notes (Addendum)
[place] PHYSICAL MEDICINE & REHABILITATION PROGRESS NOTE   Subjective/Complaints: Feels sore, especially left hip. Ok night per [employer]. Still some HS confusion but more appropriate otherwise. Had another episode of loose stool this morning after a few yesterday--no odor, abd pain, fever  ROS: Patient denies fever, rash, sore throat, blurred vision, nausea, vomiting,   cough, shortness of breath or chest pain,  headache, or mood change.     Objective:   No results found. Recent Labs    06/30/19 0921  WBC 5.8  HGB 9.3*  HCT 28.4*  PLT 123*   Recent Labs    06/30/19 0502 06/30/19 0921  NA 137 136  K 4.5 4.1  CL 102 100  CO2 29 27  GLUCOSE 104* 165*  BUN 20 19  CREATININE 0.60 0.76  CALCIUM 8.5* 8.5*    Intake/Output Summary (Last 24 hours) at 07/02/2019 0857 Last data filed at 07/02/2019 0757 Gross per 24 hour  Intake 600 ml  Output -  Net 600 ml     Physical Exam: Vital Signs Blood pressure 110/63, pulse (!) 107, temperature 98.2 F (36.8 C), temperature source Oral, resp. rate 18, height 5' (1.524 m), weight 52.9 kg, last menstrual period 11/02/1980, SpO2 97 %.  Physical Exam  Constitutional: No distress . Vital signs reviewed. HEENT: EOMI, oral membranes moist Neck: supple Cardiovascular: RRR without murmur. No JVD    Respiratory: CTA Bilaterally without wheezes or rales. Normal effort    GI: BS +, non-tender, non-distended  Musculoskeletal:     Comments: Left hip with edema and tenderness and associated bruising Neurological:  Oriented to self and hospital, reason she's here. More attentive and alert today. Improved insight HOH Motor: Bilateral upper extremities: 4/5 proximal distal Right lower extremity: 4-/5 hip flexion, knee extension, 4/5 ankle dorsiflexion Left lower extremity: 2/5 hip flexion, knee extension (ongoing pain inhibition), 3/5 ankle dorsiflexion  Skin: She is not diaphoretic.  Left hip incision C/D/I Left proximal lower extremity with  bruising Psychiatric:pleasant and cooperative.      Assessment/Plan: 1. Functional deficits secondary to L  Hip fracture s/p IM nail  which require 3+ hours per day of interdisciplinary therapy in a comprehensive inpatient rehab setting.  Physiatrist is providing close team supervision and 24 hour management of active medical problems listed below.  Physiatrist and rehab team continue to assess barriers to discharge/monitor patient progress toward functional and medical goals  Care Tool:  Bathing    Body parts bathed by patient: Right arm, Left arm, Chest, Abdomen, Right upper leg, Left upper leg, Face   Body parts bathed by helper: Front perineal area, Buttocks, Right lower leg, Left lower leg     Bathing assist Assist Level: Moderate Assistance - Patient 50 - 74%     Upper Body Dressing/Undressing Upper body dressing   What is the patient wearing?: Dress    Upper body assist Assist Level: Moderate Assistance - Patient 50 - 74%    Lower Body Dressing/Undressing Lower body dressing      What is the patient wearing?: Underwear/pull up     Lower body assist Assist for lower body dressing: Total Assistance - Patient < 25%     Toileting Toileting    Toileting assist Assist for toileting: Total Assistance - Patient < 25%     Transfers Chair/bed transfer  Transfers assist     Chair/bed transfer assist level: Minimal Assistance - Patient > 75% Chair/bed transfer assistive device: Programmer, multimedia   Ambulation assist  Assist level: Contact Guard/Touching assist Assistive device: Walker-rolling Max distance: 15'   Walk 10 feet activity   Assist  Walk 10 feet activity did not occur: Safety/medical concerns(decreased strenght/activity tolerance and pain)  Assist level: Contact Guard/Touching assist Assistive device: Walker-rolling   Walk 50 feet activity   Assist Walk 50 feet with 2 turns activity did not occur: Safety/medical  concerns(decreased strenght/activity tolerance and pain)         Walk 150 feet activity   Assist Walk 150 feet activity did not occur: Safety/medical concerns(decreased strenght/activity tolerance and pain)         Walk 10 feet on uneven surface  activity   Assist Walk 10 feet on uneven surfaces activity did not occur: Safety/medical concerns(decreased strenght/activity tolerance and pain)         Wheelchair     Assist Will patient use wheelchair at discharge?: Yes Type of Wheelchair: Manual    Wheelchair assist level: Moderate Assistance - Patient 50 - 74% Max wheelchair distance: 46'    Wheelchair 50 feet with 2 turns activity    Assist    Wheelchair 50 feet with 2 turns activity did not occur: Safety/medical concerns(decreased strenght/activity tolerance)       Wheelchair 150 feet activity     Assist  Wheelchair 150 feet activity did not occur: Safety/medical concerns(decreased strength/activity tolerance and pain)       Blood pressure 110/63, pulse (!) 107, temperature 98.2 F (36.8 C), temperature source Oral, resp. rate 18, height 5' (1.524 m), weight 52.9 kg, last menstrual period 11/02/1980, SpO2 97 %.  ASSESSMENT and PLAN:  1.  Deficits with mobility, self-care, delirium, agitation secondary to left hip fracture status post IM nail. WBAT on LLE             -Continue CIR therapies including PT, OT  2.  Antithrombotics: -DVT/anticoagulation:  Pharmaceutical: Lovenox             -antiplatelet therapy: ASA 3. Chronic LBP/Pain Management: Continue gabapentin bid with Norco prn             reduced norco to 5/325 given AMS   -limit hydrocodone use as possible  -kpad for left hip 4. Mood: LCSW to follow for evaluation and support.              -antipsychotic agents: Continue ativan prn for agitation/sleep  -limit given confusion/age 26. Neuropsych: This patient is not capable of making decisions on her own behalf. 6. Skin/Wound Care:  Monitor wound daily for healing.  7. Fluids/Electrolytes/Nutrition: Monitor I/Os. Offer fluids frequently as getting dehydrated. BUN 22--->32 on last check.  BMP ordered for tomorrow a.m.  8/27- BUN/Cr OK 8/26- labs again Fri. 8. HTN: Continue Metoprolol bid and Cozaar daily.  Monitor with increased mobility..    -8/30: reduce cozaar to 50mg  daily for soft BP's 9. Delirum: Question dementia worsened in hospital setting  -improved with reduction in hydrocodone  -rx'ed potential UTI as below  -still some HS confusion  - zyprexa prn at bedtime   -continue to monitor 10. Diarrhea/loose stools: Has history of C diff/colon cancer.   -Discontinued Senokot.   -held allopurinol 8/29  -continue florastor and metamucil  -encourage adequate PO intake  -no signs of infectious diarrhea at present  -check labs tomorrow 11. ABLA: Continue to monitor with serial checks.  CBC ordered for tomorrow a.m. 12. Thrombocytopenia: Monitor with serial checks as on Lovenox. Monitor for signs of bleeding.     -platelets 123 8/28  -  recheck Monday. 13.  E coli UTI: Completed 5 days of rocephin --recheck urine culture for clearing due to ongoing delirium. Monitor WBC/temps for signs of infection.   14. Hypokalemia  8/26- will replete her K+ from 3.1 today- will give KCl x 2 doses 40 mEq and recheck BMP.   8/28- K+ improved  -labs tomorrow 8/31 15. Hypocalcemia  8/26- Ca 7.8--replete 16. Possible UTI- UA suspicious  -ucs still pending from 8/28  -Fosfomycin  X 1 on 8/28 since allergic to Bactrim and PCN.  -afebrile   LOS: 5 days A FACE TO FACE EVALUATION WAS PERFORMED  [name] 07/02/2019, 8:57 AM

## 2019-07-03 ENCOUNTER — Inpatient Hospital Stay (HOSPITAL_COMMUNITY): Payer: MEDICARE

## 2019-07-03 ENCOUNTER — Inpatient Hospital Stay (HOSPITAL_COMMUNITY): Payer: MEDICARE | Admitting: Speech Pathology

## 2019-07-03 ENCOUNTER — Inpatient Hospital Stay (HOSPITAL_COMMUNITY): Payer: MEDICARE | Admitting: Occupational Therapy

## 2019-07-03 LAB — CBC
HCT: 30.2 % — ABNORMAL LOW (ref 36.0–46.0)
Hemoglobin: 9.4 g/dL — ABNORMAL LOW (ref 12.0–15.0)
MCH: 32.9 pg (ref 26.0–34.0)
MCHC: 31.1 g/dL (ref 30.0–36.0)
MCV: 105.6 fL — ABNORMAL HIGH (ref 80.0–100.0)
Platelets: 186 10*3/uL (ref 150–400)
RBC: 2.86 MIL/uL — ABNORMAL LOW (ref 3.87–5.11)
RDW: 15.8 % — ABNORMAL HIGH (ref 11.5–15.5)
WBC: 6 10*3/uL (ref 4.0–10.5)
nRBC: 0 % (ref 0.0–0.2)

## 2019-07-03 LAB — BASIC METABOLIC PANEL
Anion gap: 10 (ref 5–15)
BUN: 18 mg/dL (ref 8–23)
CO2: 29 mmol/L (ref 22–32)
Calcium: 8.6 mg/dL — ABNORMAL LOW (ref 8.9–10.3)
Chloride: 99 mmol/L (ref 98–111)
Creatinine, Ser: 0.75 mg/dL (ref 0.44–1.00)
GFR calc Af Amer: >60 mL/min (ref >60)
GFR calc non Af Amer: >60 mL/min (ref >60)
Glucose, Bld: 96 mg/dL (ref 70–99)
Potassium: 4.4 mmol/L (ref 3.5–5.1)
Sodium: 138 mmol/L (ref 135–145)

## 2019-07-03 NOTE — Plan of Care (Signed)
  Problem: RH Cognition - SLP Goal: RH LTG Patient will demonstrate orientation with cues Description:  LTG:  Patient will demonstrate orientation to person/place/time/situation with cues (SLP)   Flowsheets (Taken 07/03/2019 1202) LTG: Patient will demonstrate orientation using cueing (SLP): Minimal Assistance - Patient > 75% Note: Upgraded due to progress 07/03/19   Problem: RH Problem Solving Goal: LTG Patient will demonstrate problem solving for (SLP) Description: LTG:  Patient will demonstrate problem solving for basic/complex daily situations with cues  (SLP) Flowsheets (Taken 07/03/2019 1202) LTG Patient will demonstrate problem solving for: Minimal Assistance - Patient > 75% Note: Upgraded due to progress 07/03/19   Problem: RH Memory Goal: LTG Patient will follow step by step directions w/cues (SLP) Description: LTG: Patient will follow step by step directions with cues (SLP). Flowsheets (Taken 07/03/2019 1202) LTG: Patient will follow step by step directions w/cues: Minimal Assistance - Patient > 75% Note: Upgraded due to progress 07/03/19 Goal: LTG Patient will use memory compensatory aids to (SLP) Description: LTG:  Patient will use memory compensatory aids to recall biographical/new, daily complex information with cues (SLP) Flowsheets (Taken 07/03/2019 1202) LTG: Patient will use memory compensatory aids to (SLP): Moderate Assistance - Patient 50 - 74% Note: Upgraded due to progress 07/03/19   Problem: RH Awareness Goal: LTG: Patient will demonstrate awareness during functional activites type of (SLP) Description: LTG: Patient will demonstrate awareness during functional activites type of (SLP) Flowsheets (Taken 07/03/2019 1202) LTG: Patient will demonstrate awareness during cognitive/linguistic activities with assistance of (SLP): Moderate Assistance - Patient 50 - 74% Note: Upgraded due to progress 07/03/19

## 2019-07-03 NOTE — Progress Notes (Signed)
Speech Language Pathology Daily Session Note  Patient Details  Name: Michelle Gregory MRN: [id number] Date of Birth: 07-31-1923  Today's Date: 07/03/2019 SLP Individual Time: 1100-1200 SLP Individual Time Calculation (min): 60 min  Short Term Goals: Week 1: SLP Short Term Goal 1 (Week 1): Pt recall basic biographical and/or daily information with Mod A for use of external aids. SLP Short Term Goal 2 (Week 1): Pt will orient to place, time, and situation with Mod A multimodal cues. SLP Short Term Goal 3 (Week 1): Pt will complete basic, familiar tasks with Mod A cues for problem solving. SLP Short Term Goal 4 (Week 1): Pt will demonstrate intellectual awareness by stating at least 1 physical and 1 cognitive deficit with Mod A cues.  Skilled Therapeutic Interventions: Pt was seen for skilled ST services focused on cognition. Pt was oriented to self, place, and situation upon our greeting with Supervision A; Min A question cues and feedback provided to orient to time. SLP facilitated session with Min A for labeling an August 2020 calendar, sequencing days on numbers, and identifying today's date on said calendar. Pt located and interpreted information on a weekly weather forecast with Mod A for basic problem solving from SLP. Pt expressed frustration at not knowing when she would be able to leave hospital, so SLP explained medical and therapy team process for determining d/c date and notified her next conference is this Wednesday. Pt's daughter arrived during session and all urgent questions were answered regarding current visitor restrictions (due to COVID-19) and pt's ST goals. Although pt was oriented and coherent during today's session, her daughter reported pt often appears to be more confused than baseline throughout this admission. SLP educated daughter regarding fluctuations in orientation and cognitive function, discussed "sundowning" and potential impact of disruption in routine that can cause  increased confusion. Prior to leaving, pt demonstrated how to use call bell with Min A prompts from clinician. Pt was left in bed with alarm set, all needs within reach and daughter still present. Continue per current plan of care.       Pain Pain Assessment Pain Score: 0-No pain  Therapy/Group: Individual Therapy  Arbutus Leas 07/03/2019, 1:14 PM

## 2019-07-03 NOTE — Progress Notes (Signed)
[facility] PH[facility]ICAL MEDICINE [facility] RE[facility]ABILITATION PROGRESS NOTE   Subjective/Complaints:   Pt reports she fell asleep holding her tea and poured it on herself. Except for being sleepy, denies any issues.    ROS: Patient denies fever, rash, sore throat, blurred vision, nausea, vomiting,   cough, shortness of breath or chest pain,  headache, or mood change.     Objective:   No results found. Recent Labs    06/30/19 0921 07/03/19 0645  WBC 5.8 6.0  HGB 9.3* 9.4*  HCT 28.4* 30.2*  PLT 123* 186   Recent Labs    06/30/19 0921 07/03/19 0517  NA 136 138  K 4.1 4.4  CL 100 99  CO2 27 29  GLUCOSE 165* 96  BUN 19 18  CREATININE 0.76 0.75  CALCIUM 8.5* 8.6*    Intake/Output Summary (Last 24 hours) at 07/03/2019 0848 Last data filed at 07/03/2019 0847 Gross per 24 hour  Intake 360 ml  Output -  Net 360 ml     Physical Exam: Vital Signs Blood pressure 121/71, pulse 98, temperature 98 F (36.7 C), temperature source Oral, resp. rate 20, height 5' (1.524 m), weight 52.9 kg, last menstrual period 11/02/1980, SpO2 95 %.  Physical Exam  Constitutional: No distress . Vital signs reviewed. Literally fell asleep with warm tea in her hand- spilled some on herself- was not hot; her fork in 1 hand and tea in other, asleep, NAD HEENT: EOMI, oral membranes moist Neck: supple Cardiovascular: RRR without murmur. No JVD    Respiratory: CTA Bilaterally without wheezes or rales. Normal effort    GI: BS +, non-tender, non-distended  Musculoskeletal:     Comments: Left hip with edema and tenderness and associated bruising Neurological:  Oriented to self and hospital,Ox2 HOH Motor: Bilateral upper extremities: 4/5 proximal distal Right lower extremity: 4-/5 hip flexion, knee extension, 4/5 ankle dorsiflexion Left lower extremity: 2/5 hip flexion, knee extension (ongoing pain inhibition), 3/5 ankle dorsiflexion  Skin: She is not diaphoretic.  Left hip incision C/D/I Left proximal lower  extremity with bruising Psychiatric:pleasant and cooperative.      Assessment/Plan: 1. Functional deficits secondary to L  Hip fracture s/p IM nail  which require 3+ hours per day of interdisciplinary therapy in a comprehensive inpatient rehab setting.  Physiatrist is providing close team supervision and 24 hour management of active medical problems listed below.  Physiatrist and rehab team continue to assess barriers to discharge/monitor patient progress toward functional and medical goals  Care Tool:  Bathing    Body parts bathed by patient: Right arm, Left arm, Chest, Abdomen, Right upper leg, Left upper leg, Face   Body parts bathed by helper: Front perineal area, Buttocks, Right lower leg, Left lower leg     Bathing assist Assist Level: Moderate Assistance - Patient 50 - 74%     Upper Body Dressing/Undressing Upper body dressing   What is the patient wearing?: Dress    Upper body assist Assist Level: Moderate Assistance - Patient 50 - 74%    Lower Body Dressing/Undressing Lower body dressing      What is the patient wearing?: Underwear/pull up     Lower body assist Assist for lower body dressing: Total Assistance - Patient < 25%     Toileting Toileting    Toileting assist Assist for toileting: Total Assistance - Patient < 25%     Transfers Chair/bed transfer  Transfers assist     Chair/bed transfer assist level: Minimal Assistance - Patient > 75%  Chair/bed transfer assistive device: Museum/gallery exhibitions officer assist      Assist level: Contact Guard/Touching assist Assistive device: Walker-rolling Max distance: 10   Walk 10 feet activity   Assist  Walk 10 feet activity did not occur: Safety/medical concerns(decreased strenght/activity tolerance and pain)  Assist level: Contact Guard/Touching assist Assistive device: Walker-rolling   Walk 50 feet activity   Assist Walk 50 feet with 2 turns activity did not occur:  Safety/medical concerns(decreased strenght/activity tolerance and pain)         Walk 150 feet activity   Assist Walk 150 feet activity did not occur: Safety/medical concerns(decreased strenght/activity tolerance and pain)         Walk 10 feet on uneven surface  activity   Assist Walk 10 feet on uneven surfaces activity did not occur: Safety/medical concerns(decreased strenght/activity tolerance and pain)         Wheelchair     Assist Will patient use wheelchair at discharge?: Yes Type of Wheelchair: Manual    Wheelchair assist level: Moderate Assistance - Patient 50 - 74% Max wheelchair distance: 39'    Wheelchair 50 feet with 2 turns activity    Assist    Wheelchair 50 feet with 2 turns activity did not occur: Safety/medical concerns(decreased strenght/activity tolerance)       Wheelchair 150 feet activity     Assist  Wheelchair 150 feet activity did not occur: Safety/medical concerns(decreased strength/activity tolerance and pain)       Blood pressure 121/71, pulse 98, temperature 98 F (36.7 C), temperature source Oral, resp. rate 20, height 5' (1.524 m), weight 52.9 kg, last menstrual period 11/02/1980, SpO2 95 %.  ASSESSMENT and PLAN:  1.  Deficits with mobility, self-care, delirium, agitation secondary to left hip fracture status post IM nail. WBAT on LLE             -Continue CIR therapies including PT, OT  2.  Antithrombotics: -DVT/anticoagulation:  Pharmaceutical: Lovenox             -antiplatelet therapy: ASA 3. Chronic LBP/Pain Management: Continue gabapentin bid with Norco prn             reduced norco to 5/325 given AMS   -limit hydrocodone use as possible  -kpad for left hip 4. Mood: LCSW to follow for evaluation and support.              -antipsychotic agents: Continue ativan prn for agitation/sleep  -limit given confusion/age 8. Neuropsych: This patient is not capable of making decisions on her own behalf. 6. Skin/Wound  Care: Monitor wound daily for healing.  7. Fluids/Electrolytes/Nutrition: Monitor I/Os. Offer fluids frequently as getting dehydrated. BUN 22--->32 on last check.  BMP ordered for tomorrow a.m.  8/27- BUN/Cr OK 8/26- labs again Fri. 8. HTN: Continue Metoprolol bid and Cozaar daily.  Monitor with increased mobility..    -8/30: reduce cozaar to 50mg  daily for soft BP's 9. Delirum: Question dementia worsened in hospital setting  -improved with reduction in hydrocodone  -rx'ed potential UTI as below  -still some HS confusion  - zyprexa prn at bedtime   -continue to monitor  8/31- still sleepy early in AM- working with therapy; con't meds  10. Diarrhea/loose stools: Has history of C diff/colon cancer.   -Discontinued Senokot.   -held allopurinol 8/29  -continue florastor and metamucil  -encourage adequate PO intake  -no signs of infectious diarrhea at present  -check labs tomorrow  8/31- BUN 18; Cr nml;  K+ 4.4 11. ABLA: Continue to monitor with serial checks.  CBC ordered for tomorrow a.m. 12. Thrombocytopenia: Monitor with serial checks as on Lovenox. Monitor for signs of bleeding.     -platelets 123 8/28  -recheck Monday. 13.  E coli UTI: Completed 5 days of rocephin --recheck urine culture for clearing due to ongoing delirium. Monitor WBC/temps for signs of infection.   14. Hypokalemia  8/26- will replete her K+ from 3.1 today- will give KCl x 2 doses 40 mEq and recheck BMP.   8/28- K+ improved  -labs tomorrow 8/31  8/31- K+ 4.4 15. Hypocalcemia  8/26- Ca 7.8--replete 16. Possible UTI- UA suspicious  -ucs still pending from 8/28  -Fosfomycin  X 1 on 8/28 since allergic to Bactrim and PCN.  -afebrile  8/31- U Cx grew nothing- <10k    LOS: 6 days A FACE TO FACE EVALUATION WAS PERFORMED  Artemis Loyal 07/03/2019, 8:48 AM

## 2019-07-03 NOTE — Progress Notes (Signed)
Occupational Therapy Session Note  Patient Details  Name: Michelle Gregory MRN: [id number] Date of Birth: 1923-01-10  Today's Date: 07/03/2019 OT Individual Time: 1300-1415 OT Individual Time Calculation (min): 75 min    Short Term Goals: Week 1:  OT Short Term Goal 1 (Week 1): Pt will perform sit to stands with min cues for proper hand placement in prep for ADL OT Short Term Goal 2 (Week 1): Pt will perform toilet/ BSC transfers with RW with min guard OT Short Term Goal 3 (Week 1): Pt will perform toileting tasks with mod A OT Short Term Goal 4 (Week 1): Pt will don LB clothing with min A with AE prn  Skilled Therapeutic Interventions/Progress Updates:    Treatment session with focus on self-care retraining, dynamic standing balance, and functional transfers. Pt received in bed agreeable to bathing and dressing this session.  Pt's daughter present and encouraged pt to engage in bathing at shower level.  Pt reports urgent need to have BM, while therapist gathering items in prep for shower.  Therefore completed stand pivot transfer to [facility].  Mod assist for initial sit > stand from EOB and min assist during stand pivot to [facility] with RW.  Pt incontinent of bowel, then completing BM and void on toilet.  Ambulated to room shower with RW with min assist.  Engaged in bathing with min cues for sequencing and assistance when washing buttocks and perineal area.  Min assist transfers in/out of shower with min cues for technique.  Engaged in dressing at sit > stand level at sink with assistance when threading BLE in to underwear and pants.  Pt reports typically wearing "house dresses" at home and pullup, daughter assists with shirts if pt going out in the community.  Pt able to pull underwear and pants over hips with increased time and min assist to maintain standing balance.  Pt left upright in w/c with call bell in reach and daughter present for supervision.  Therapy Documentation Precautions:   Precautions Precautions: Fall Restrictions Weight Bearing Restrictions: Yes LLE Weight Bearing: Weight bearing as tolerated General:   Vital Signs: Therapy Vitals Temp: 98.8 F (37.1 C) Temp Source: Oral Pulse Rate: 100 Resp: 18 BP: (!) 104/56 Patient Position (if appropriate): Lying Oxygen Therapy SpO2: (!) 81 % O2 Device: Room Air Pain: Pain Assessment Pain Score: 0-No pain   Therapy/Group: Individual Therapy  Simonne Come 07/03/2019, 2:56 PM

## 2019-07-03 NOTE — Progress Notes (Signed)
Social Work Patient ID: Michelle Gregory, female   DOB: 09/27/1923, 85 y.o.   MRN: [id number]  Met with pt and duaghter who is here to visit pt. She feels son in-law needs to come in and this would help pt since they are very close. Will see about having him come in to see pt. Encouraged daughter to come during her therapies and see how she is doing and what she will need to do to assist her at home. She will come in this week to observe Mom in therapies.  

## 2019-07-03 NOTE — Progress Notes (Signed)
Physical Therapy Session Note  Patient Details  Name: Michelle Gregory MRN: [id number] Date of Birth: [birth year]  Today's Date: 07/03/2019 PT Individual Time: 0915-1015 PT Individual Time Calculation (min): 60 min   Short Term Goals: Week 1:  PT Short Term Goal 1 (Week 1): STG=LTG due to short ELOS.  Skilled Therapeutic Interventions/Progress Updates:     Patient in bed upon PT arrival. Patient alert and agreeable to PT session, however reported "I feel funny" and stated it started when she woke up. Vitals: BP: 105/67, HR 109-113, SPO2 97%, Temp 97.7 in supine. Patient also reported her hands were very cold this morning and her pain in her L hip was 8/10. She declined asking for pain medication despite PT's encouragement for pain management with therapy. PT provided repositioning, relaxation, and rest breaks as pain interventions throughout session.   Therapeutic Activity: Bed Mobility: Patient performed supine to/from sit x2 with min A for LE and trunk management with HOB slightly elevated. Provided verbal cues for bringing feet off the bed before pushing through elbows to sit up. She performed scooting up in bed x1 with mod A, provided cues for pushing through R heel and pulling up on bed rails with B UEs. Patient reported worsening of her reported symptoms "feeling funny" in sitting during first trial and requested to return to supine. Vitals in sitting: BP 112/66, HR 106, SPO2 98%. During second trial patient reported improved symptoms.  Transfers: She performed a stand pivot transfer to/from the [facility] with min A using a RW, HR 108-110 after each trial. Patient requested to lie back down and continue with bed level exercises due to not feeling well, however denied previous sensation when PT asked for clarification. Required total A for peri-care and LB dressing during toileting.   Therapeutic Exercise: Patient performed the following exercises with verbal and tactile cues for proper technique. -L  SLR 2x10 with min A -L heel slides 2x10 -L hip abduction 2x10  Patient in bed at end of session with breaks locked, bed alarm set, and all needs within reach. RN made aware of patient's symptoms and vitals during session   Therapy Documentation Precautions:  Precautions Precautions: Fall Restrictions Weight Bearing Restrictions: Yes LLE Weight Bearing: Weight bearing as tolerated    Therapy/Group: Individual Therapy  Jamine Wingate L Padraic Marinos PT, DPT  07/03/2019, 12:43 PM

## 2019-07-03 NOTE — Plan of Care (Signed)
  Problem: Consults Goal: Skin Care Protocol Initiated - if Braden Score 18 or less Description: If consults are not indicated, leave blank or document N/A Outcome: Progressing   Problem: RH BOWEL ELIMINATION Goal: RH STG MANAGE BOWEL WITH ASSISTANCE Description: STG Manage Bowel with Min.Assistance. Outcome: Progressing   Problem: RH BLADDER ELIMINATION Goal: RH STG MANAGE BLADDER WITH ASSISTANCE Description: STG Manage Bladder With min Assistance Outcome: Progressing   Problem: RH SKIN INTEGRITY Goal: RH STG SKIN FREE OF INFECTION/BREAKDOWN Description: With Mod. Assist  Outcome: Progressing Goal: RH STG MAINTAIN SKIN INTEGRITY WITH ASSISTANCE Description: STG Maintain Skin Integrity With Mod.Assistance. Outcome: Progressing Goal: RH STG ABLE TO PERFORM INCISION/WOUND CARE W/ASSISTANCE Description: STG Able To Perform Incision/Wound Care With Min.Assistance. Outcome: Progressing   Problem: RH SAFETY Goal: RH STG ADHERE TO SAFETY PRECAUTIONS W/ASSISTANCE/DEVICE Description: STG Adhere to Safety Precautions With Mod.Assistance/Device. Outcome: Progressing   Problem: RH PAIN MANAGEMENT Goal: RH STG PAIN MANAGED AT OR BELOW PT'S PAIN GOAL Description: Less than 3 Outcome: Progressing   Problem: Consults Goal: RH GENERAL PATIENT EDUCATION Description: See Patient Education module for education specifics. Outcome: Progressing

## 2019-07-03 NOTE — Progress Notes (Signed)
Patients vitals Q2hrs were WNL, will start 4hr vitals. No acute distress noted

## 2019-07-04 ENCOUNTER — Inpatient Hospital Stay (HOSPITAL_COMMUNITY): Payer: MEDICARE

## 2019-07-04 ENCOUNTER — Inpatient Hospital Stay (HOSPITAL_COMMUNITY): Payer: MEDICARE | Admitting: Speech Pathology

## 2019-07-04 NOTE — Progress Notes (Signed)
Speech Language Pathology Daily Session Note  Patient Details  Name: Michelle Gregory MRN: [id number] Date of Birth: [birth year]  Today's Date: 07/04/2019 SLP Individual Time: [id number] SLP Individual Time Calculation (min): 45 min  Short Term Goals: Week 1: SLP Short Term Goal 1 (Week 1): Pt recall basic biographical and/or daily information with Mod A for use of external aids. SLP Short Term Goal 2 (Week 1): Pt will orient to place, time, and situation with Mod A multimodal cues. SLP Short Term Goal 3 (Week 1): Pt will complete basic, familiar tasks with Mod A cues for problem solving. SLP Short Term Goal 4 (Week 1): Pt will demonstrate intellectual awareness by stating at least 1 physical and 1 cognitive deficit with Mod A cues.  Skilled Therapeutic Interventions: Pt was seen for skilled ST services focused on cognition. Pt was received sitting upright in wheelchair eating lunch. Pt's daughter was also present. NT arrived during session to take vitals; pt able to recall NT's name and follow 1-step directions with Min A while he took her vitals. SLP facilitated session with overall Mod A cues for basic problem solving and sequencing functional 4-step action picture cards (ex: frying an egg, making the bed, etc.) Pt reports her vision is poor and believed to influence performance during this task. SLP provided verbal descriptions of pictures as needed. Pt was oriented to self, place, and situation, however required Min A question cues to identify time (month, date, day of the week). SLP and pt created September calendar as visual aid to assist with orientation. Pt was left in wheelchair with alarm set, all needs within reach, and RN present. Continue per current plan of care.      Pain Pain Assessment Pain Scale: 0-10 Pain Score: 0-No pain Faces Pain Scale: Hurts a little bit Pain Type: Acute pain Pain Location: Hip Pain Orientation: Left Pain Radiating Towards: leg Pain Intervention(s):  Medication (See eMAR) PAINAD (Pain Assessment in Advanced Dementia) Breathing: normal Negative Vocalization: none Facial Expression: smiling or inexpressive Body Language: relaxed Consolability: no need to console PAINAD Score: 0  Therapy/Group: Individual Therapy  Arbutus Leas 07/04/2019, 1:51 PM

## 2019-07-04 NOTE — Progress Notes (Signed)
Occupational Therapy Weekly Progress Note  Patient Details  Name: Michelle Gregory MRN: [id number] Date of Birth: 12/30/1922  Beginning of progress report period: June 28, 2019 End of progress report period: July 04, 2019  Patient has met 1 of 4 short term goals.  Pt progress with BADLs has been slow since admission.  Pt requires mod/max verbal cues for sequencing and encouragement.  Pt requires max verbal cues for safety awareness.  Pt currently requires mod A for bathing at shower level, mod A for UB dressing and max A for LB dressing tasks.  Pt performs toilet transfers with min A but requires tot A for toileting tasks.  Pt's daughter has been present for therapy sessions and pt's son-in-law has been encouraged to participate as well.   Patient continues to demonstrate the following deficits: muscle weakness, decreased cardiorespiratoy endurance and decreased standing balance, decreased balance strategies and acute pain and therefore will continue to benefit from skilled OT intervention to enhance overall performance with BADL and Reduce care partner burden.  Patient progressing toward long term goals..  Continue plan of care.  OT Short Term Goals Week 1:  OT Short Term Goal 1 (Week 1): Pt will perform sit to stands with min cues for proper hand placement in prep for ADL OT Short Term Goal 1 - Progress (Week 1): Progressing toward goal OT Short Term Goal 2 (Week 1): Pt will perform toilet/ BSC transfers with RW with min guard OT Short Term Goal 2 - Progress (Week 1): Met OT Short Term Goal 3 (Week 1): Pt will perform toileting tasks with mod A OT Short Term Goal 3 - Progress (Week 1): Progressing toward goal OT Short Term Goal 4 (Week 1): Pt will don LB clothing with min A with AE prn OT Short Term Goal 4 - Progress (Week 1): Progressing toward goal Week 2:  OT Short Term Goal 1 (Week 2): Pt will perform sit to stands with min cues for proper hand placement in prep for ADL OT Short Term  Goal 2 (Week 2): Pt will perform toileting tasks with mod A OT Short Term Goal 3 (Week 2): Pt will don LB clothing with min A with AE prn OT Short Term Goal 4 (Week 2): Pt will complete bathing tasks with min A sit<>stand at sink of shower    Leroy Libman 07/04/2019, 6:51 AM

## 2019-07-04 NOTE — Progress Notes (Signed)
Physical Therapy Weekly Progress Note  Patient Details  Name: Michelle Gregory MRN: [id number] Date of Birth: [birth year]  Beginning of progress report period: June 28, 2019 End of progress report period: July 04, 2019  Today's Date: 07/04/2019 PT Individual Time: 1000-1115 PT Individual Time Calculation (min): 75 min   Patient is progressing towards long term goals, no short term goals set due to short estimated length of stay.  She currently requires min A for bed mobility, transfers, and going up one six inch step, and CGA for gait 15 feet x2.   Patient continues to demonstrate the following deficits muscle weakness and muscle joint tightness, decreased cardiorespiratoy endurance, decreased memory and decreased sitting balance, decreased standing balance, decreased postural control and decreased balance strategies and therefore will continue to benefit from skilled PT intervention to increase functional independence with mobility.  Patient progressing toward long term goals..  Continue plan of care.  PT Short Term Goals Week 1:  PT Short Term Goal 1 (Week 1): STG=LTG due to short ELOS. PT Short Term Goal 1 - Progress (Week 1): Progressing toward goal Week 2:  PT Short Term Goal 1 (Week 2): STG=LTG due to short ELOS.  Skilled Therapeutic Interventions/Progress Updates:     Patient in bed upon PT arrival. Patient alert and agreeable to PT session. She reported 3/10 L hip pain and RN provided pain medicine at beginning of session. PT provided rest breaks, repositioning, and distraction as pain interventions throughout session. Vitals in supine: 111/69, HR 96, SPO2 97% on RA.  Therapeutic Activity: Bed Mobility: Patient performed supine to sit with CGA and heavy cues for technique and increased time in a flat bed without use of bed rail. Provided verbal cues for sliding B heels off the bed then pushing through elbows to sit up. She doffed a hospital gown and donned a button down shirt with  mod A and pants with max A to thread LEs through and pull them up in standing with CGA for balance. Transfers: Patient performed sit to/from stand x3 and stand pivot x2 with min A-CGA using a RW. Provided verbal cues for scooting forward to stand, hand placement on RW, and reaching back to sit with intermittent hand-over-hand to assist finding arm rest when reaching back.  Gait Training:  Patient ambulated 76 feet and 105 feet using RW with CGA for safety/balance. Ambulated with reciprocal gait pattern with decreased step length on R, decreased stance time on L, increased hip internal rotation on L, increased knee and hip flexion with forward trunk lean, and decreased step height and length. Provided verbal cues for erect posture, increased knee extension in stance, increased L hip external rotation to neutral, and looking ahead.  Wheelchair Mobility:  Provided patient with a 16x16 hemi-height w/c and patient propelled wheelchair 100 feet with a 90 degree and 180 degree turn with min A using B UE and intermittent use of R LE. Provided verbal cues for steering, turning technique, using foot for realigning and assisting UEs, patient reported that she preferred to use B UEs and demonstrated increased speed with only UE use.   Patient in w/c with L ELR at end of session with breaks locked, chair alarm set, and all needs within reach.    Therapy Documentation Precautions:  Precautions Precautions: Fall Restrictions Weight Bearing Restrictions: Yes LLE Weight Bearing: Weight bearing as tolerated   Therapy/Group: Individual Therapy  Diksha Tagliaferro L Natesha Hassey PT, DPT  07/04/2019, 12:22 PM

## 2019-07-04 NOTE — Progress Notes (Signed)
Occupational Therapy Session Note  Patient Details  Name: Michelle Gregory MRN: [id number] Date of Birth: [birth year]  Today's Date: 07/04/2019 OT Individual Time: [id number] OT Individual Time Calculation (min): 60 min  and Today's Date: 07/04/2019 OT Missed Time: 15 Minutes Missed Time Reason: Pain   Short Term Goals: Week 2:  OT Short Term Goal 1 (Week 2): Pt will perform sit to stands with min cues for proper hand placement in prep for ADL OT Short Term Goal 2 (Week 2): Pt will perform toileting tasks with mod A OT Short Term Goal 3 (Week 2): Pt will don LB clothing with min A with AE prn OT Short Term Goal 4 (Week 2): Pt will complete bathing tasks with min A sit<>stand at sink of shower  Skilled Therapeutic Interventions/Progress Updates:    Pt resting in bed upon arrival with K-pad on B hands.  K-pad unit was not turned on.  Water added and activated.  Pt pleased with warmth.  Pt declined donning clothes because she stated she wore blouse and pants previous day.  Focus on bed mobility and sit<>stand/standing balance.  Pt perseverated on how cold her hands were.  Sit<>stand and bed mobility with min A and multiple rest breaks.  Pt declined bathing 2/2 no clean clothing to change into. Pt declined going up to sink to brush teeth. Pt required max verbal cues for orientation and redirection to tasks.  Pt with tangential conversation throughout session with multiple episodes of repetition. Pt remained in bed with bed alarm activated and all needs within reach.   Therapy Documentation Precautions:  Precautions Precautions: Fall Restrictions Weight Bearing Restrictions: Yes LLE Weight Bearing: Weight bearing as tolerated General: General OT Amount of Missed Time: 15 Minutes Pain: Pt c/o 8/10 pain in LLE with activity; pt c/o BUE/hands are "cold with pins & needles" and K-pad provided   Therapy/Group: Individual Therapy  Leroy Libman 07/04/2019, 9:29 AM

## 2019-07-04 NOTE — Progress Notes (Signed)
Social Work Patient ID: Michelle Gregory, female   DOB: 29-Aug-1923, 85 y.o.   MRN: [id number]  Met with pt and daughter to discuss pt's ned for a ramp for the step into her home. Easier than having the son in-law lift her up it, may cayuse her pain now due to fractured hip. Pt is doing better today and wants to know if she will have urgency with her bowels form now on. Will ask PA to discuss with both while daughter is here. Will update after team conference tomorrow.

## 2019-07-04 NOTE — Progress Notes (Signed)
[facility] PHYSICAL MEDICINE & REHABILITATION PROGRESS NOTE   Subjective/Complaints:   Pt reports ate breakfast; The [facility]pad "spilled" water all over floor per nurse and per sleep chart, slept 11pm to 6am this AM.  Per reports she feels 'Not organized right now"- not sure if was due to dream or a real feeling.     ROS: Patient denies fever, rash, sore throat, blurred vision, nausea, vomiting,   cough, shortness of breath or chest pain,  headache, or mood change.     Objective:   No results found. Recent Labs    07/03/19 0645  WBC 6.0  HGB 9.4*  HCT 30.2*  PLT 186   Recent Labs    07/03/19 0517  NA 138  K 4.4  CL 99  CO2 29  GLUCOSE 96  BUN 18  CREATININE 0.75  CALCIUM 8.6*    Intake/Output Summary (Last 24 hours) at 07/04/2019 0858 Last data filed at 07/04/2019 0700 Gross per 24 hour  Intake 360 ml  Output -  Net 360 ml     Physical Exam: Vital Signs Blood pressure 136/68, pulse 98, temperature 98.2 F (36.8 C), resp. rate 20, height 5' (1.524 m), weight 52.9 kg, last menstrual period 11/02/1980, SpO2 99 %.  Physical Exam  Constitutional: No distress . Vital signs reviewed. Sitting up in bed, finished breakfast, nursing at bedside, brighter affect today, NAD HEENT: EOMI, oral membranes moist Neck: supple Cardiovascular: RRR without murmur. No JVD    Respiratory: CTA Bilaterally without wheezes or rales. Normal effort    GI: BS +, non-tender, non-distended  Musculoskeletal:     Comments: Left hip with less edema and tenderness and associated bruising Neurological:  Oriented to self and hospital,Ox2 HOH Motor: Bilateral upper extremities: 4/5 proximal distal Right lower extremity: 4-/5 hip flexion, knee extension, 4/5 ankle dorsiflexion Left lower extremity: 2/5 hip flexion, knee extension (ongoing pain inhibition), 3/5 ankle dorsiflexion  Skin: She is not diaphoretic.  Left hip incision C/D/I Left proximal lower extremity with  bruising Psychiatric:pleasant and cooperative.      Assessment/Plan: 1. Functional deficits secondary to L  Hip fracture s/p IM nail WBAT  which require 3+ hours per day of interdisciplinary therapy in a comprehensive inpatient rehab setting.  Physiatrist is providing close team supervision and 24 hour management of active medical problems listed below.  Physiatrist and rehab team continue to assess barriers to discharge/monitor patient progress toward functional and medical goals  Care Tool:  Bathing    Body parts bathed by patient: Right arm, Left arm, Chest, Abdomen, Right upper leg, Left upper leg, Face   Body parts bathed by helper: Front perineal area, Buttocks, Right lower leg, Left lower leg     Bathing assist Assist Level: Moderate Assistance - Patient 50 - 74%     Upper Body Dressing/Undressing Upper body dressing   What is the patient wearing?: Button up shirt    Upper body assist Assist Level: Moderate Assistance - Patient 50 - 74%    Lower Body Dressing/Undressing Lower body dressing      What is the patient wearing?: Underwear/pull up, Pants     Lower body assist Assist for lower body dressing: Maximal Assistance - Patient 25 - 49%     Toileting Toileting    Toileting assist Assist for toileting: Total Assistance - Patient < 25%     Transfers Chair/bed transfer  Transfers assist     Chair/bed transfer assist level: Minimal Assistance - Patient > 75% Chair/bed transfer assistive  device: Museum/gallery exhibitions officer assist      Assist level: Contact Guard/Touching assist Assistive device: Walker-rolling Max distance: 10   Walk 10 feet activity   Assist  Walk 10 feet activity did not occur: Safety/medical concerns(decreased strenght/activity tolerance and pain)  Assist level: Contact Guard/Touching assist Assistive device: Walker-rolling   Walk 50 feet activity   Assist Walk 50 feet with 2 turns activity did  not occur: Safety/medical concerns(decreased strenght/activity tolerance and pain)         Walk 150 feet activity   Assist Walk 150 feet activity did not occur: Safety/medical concerns(decreased strenght/activity tolerance and pain)         Walk 10 feet on uneven surface  activity   Assist Walk 10 feet on uneven surfaces activity did not occur: Safety/medical concerns(decreased strenght/activity tolerance and pain)         Wheelchair     Assist Will patient use wheelchair at discharge?: Yes Type of Wheelchair: Manual    Wheelchair assist level: Moderate Assistance - Patient 50 - 74% Max wheelchair distance: 3'    Wheelchair 50 feet with 2 turns activity    Assist    Wheelchair 50 feet with 2 turns activity did not occur: Safety/medical concerns(decreased strenght/activity tolerance)       Wheelchair 150 feet activity     Assist  Wheelchair 150 feet activity did not occur: Safety/medical concerns(decreased strength/activity tolerance and pain)       Blood pressure 136/68, pulse 98, temperature 98.2 F (36.8 C), resp. rate 20, height 5' (1.524 m), weight 52.9 kg, last menstrual period 11/02/1980, SpO2 99 %.  ASSESSMENT and PLAN:  1.  Deficits with mobility, self-care, delirium, agitation secondary to left hip fracture status post IM nail. WBAT on LLE             -Continue CIR therapies including PT, OT  2.  Antithrombotics: -DVT/anticoagulation:  Pharmaceutical: Lovenox             -antiplatelet therapy: ASA 3. Chronic LBP/Pain Management: Continue gabapentin bid with Norco prn             reduced norco to 5/325 given AMS   -limit hydrocodone use as possible  -kpad for left hip  9/1- Kpad was leaking- nursing fixed 4. Mood: LCSW to follow for evaluation and support.              -antipsychotic agents: Continue ativan prn for agitation/sleep  -limit given confusion/age 67. Neuropsych: This patient is not capable of making decisions on her own  behalf. 6. Skin/Wound Care: Monitor wound daily for healing.  7. Fluids/Electrolytes/Nutrition: Monitor I/Os. Offer fluids frequently as getting dehydrated. BUN 22--->32 on last check.  BMP ordered for tomorrow a.m.  8/27- BUN/Cr OK 8/26- labs again Fri. 8. HTN: Continue Metoprolol bid and Cozaar daily.  Monitor with increased mobility..    -8/30: reduce cozaar to 50mg  daily for soft BP's 9. Delirum: Question dementia worsened in hospital setting  -improved with reduction in hydrocodone  -rx'ed potential UTI as below  -still some HS confusion  - zyprexa prn at bedtime   -continue to monitor  8/31- still sleepy early in AM- working with therapy; con't meds  10. Diarrhea/loose stools: Has history of C diff/colon cancer.   -Discontinued Senokot.   -held allopurinol 8/29  -continue florastor and metamucil  -encourage adequate PO intake  -no signs of infectious diarrhea at present  -check labs tomorrow  8/31- BUN 18; Cr  nml; K+ 4.4 11. ABLA: Continue to monitor with serial checks.  CBC ordered for tomorrow a.m. 12. Thrombocytopenia: Monitor with serial checks as on Lovenox. Monitor for signs of bleeding.     -platelets 123 8/28  -recheck Monday. 13.  E coli UTI: Completed 5 days of rocephin --recheck urine culture for clearing due to ongoing delirium. Monitor WBC/temps for signs of infection.   14. Hypokalemia  8/26- will replete her K+ from 3.1 today- will give KCl x 2 doses 40 mEq and recheck BMP.   8/28- K+ improved  -labs tomorrow 8/31  8/31- K+ 4.4 15. Hypocalcemia  8/26- Ca 7.8--replete 16. Possible UTI- UA suspicious  -ucs still pending from 8/28  -Fosfomycin  X 1 on 8/28 since allergic to Bactrim and PCN.  -afebrile  8/31- U Cx grew nothing- <10k    LOS: 7 days A FACE TO FACE EVALUATION WAS PERFORMED  Shykeria Sakamoto 07/04/2019, 8:58 AM

## 2019-07-05 ENCOUNTER — Inpatient Hospital Stay (HOSPITAL_COMMUNITY): Payer: MEDICARE | Admitting: Speech Pathology

## 2019-07-05 ENCOUNTER — Inpatient Hospital Stay (HOSPITAL_COMMUNITY): Payer: MEDICARE

## 2019-07-05 DIAGNOSIS — R000 Tachycardia, unspecified: Secondary | ICD-10-CM

## 2019-07-05 LAB — BASIC METABOLIC PANEL
Anion gap: 11 (ref 5–15)
BUN: 20 mg/dL (ref 8–23)
CO2: 28 mmol/L (ref 22–32)
Calcium: 8.5 mg/dL — ABNORMAL LOW (ref 8.9–10.3)
Chloride: 101 mmol/L (ref 98–111)
Creatinine, Ser: 0.73 mg/dL (ref 0.44–1.00)
GFR calc Af Amer: >60 mL/min (ref >60)
GFR calc non Af Amer: >60 mL/min (ref >60)
Glucose, Bld: 100 mg/dL — ABNORMAL HIGH (ref 70–99)
Potassium: 4 mmol/L (ref 3.5–5.1)
Sodium: 140 mmol/L (ref 135–145)

## 2019-07-05 NOTE — Progress Notes (Signed)
Social Work Patient ID: Michelle Gregory, female   DOB: July 17, 1923, 85 y.o.   MRN: [id number]    Diagnosis code:S72.142A, S32.9XXA & M19.90  Height: 5'0  Weight: 119 lbs   Patient has l-femur and pelvic fracture which requires her trunk to be positioned in ways not feasible with a normal bed.  Head must be elevated at least 30 %. She requires frequent and immediate changes in body position which cannot be achieved with a normal bed, due to pain issues.

## 2019-07-05 NOTE — Progress Notes (Signed)
Physical Therapy Discharge Summary  Patient Details  Name: Michelle Gregory MRN: [id number] Date of Birth: 12-Oct-1923  Today's Date: 07/07/2019 PT Individual Time: 0805-0918 PT Individual Time Calculation (min): 73 min    Patient has met 10 of 11 long term goals due to improved activity tolerance, improved balance, improved postural control, increased strength, increased range of motion, decreased pain and ability to compensate for deficits.  Patient to discharge at an ambulatory level [place].   Patient's care partner is independent to provide the necessary physical assistance at discharge.  Reasons goals not met: Patient continues to require intermittent min assist for w/c propulsion in the smaller spaces of a home environment.  Recommendation:  Patient will benefit from ongoing skilled PT services in home health setting to continue to advance safe functional mobility, address ongoing impairments in strength, ROM, balance, functional mobility, edema control, patient/cargiver education, and minimize fall risk.  Equipment: hospital bed   Reasons for discharge: treatment goals met  Patient/family agrees with progress made and goals achieved: Yes  Skilled Therapeutic Interventions/Progress Updates:  Pt received supine in bed eating breakfast and agreeable to therapy session but requesting to finish her meal. Patient alert and oriented this morning. While pt completed her meal, therapist educated pt regarding discharge plan, recommendation for follow-up therapy, and recommendations for AD and DME at home. Performed supine>sit, HOB elevated and using bedrails, with supervision and increased time for L LE management. Sit<>stands using RW with CGA for steadying throughout session and intermittent min cuing for proper UE placement for safety with AD. Ambulated ~156f using RW to car room with CGA for steadying - pt continues to demonstrate increased trunk/hip/knee flexion in a crouched gait posture  with increased L LE hip internal rotation - cuing throughout for improved gait mechanics. Performed ambulatory simulated car transfer (sedan height) using RW with CGA for steadying and min assist for placing L LE in/out of car - cuing for scooting back further into the seat to improve ability to place L LE in/out. Ambulated ~165fup/down ramp with min assist for balance and cuing for AD management especially on descent to ensure it didn't roll faster than pt was ambulating. Transported in w/c to main therapy gym. Ascended/descended 5 (6" height) steps using bilateral handrails with CGA for steadying - pt ascended forwards and descended backwards leading with R LE in both directions - min cuing for moving L UE on handrail to improve her support/balance. Performed B UE w/c propulsion ~5073fack to room with min assist for turning during small spaces and otherwise supervision during straight path with pt demonstrating L veering but able to correct with verbal cuing and increased time. Pt transported remainder of distance back to room and left seated in w/c with needs in reach and seat belt alarm on.  PT Discharge Precautions/Restrictions Precautions Precautions: Fall Restrictions Weight Bearing Restrictions: Yes LLE Weight Bearing: Weight bearing as tolerated Pain Pain Assessment Pain Scale: 0-10 Pain Score: 6  Pain Type: Acute pain;Surgical pain Pain Location: Leg Pain Orientation: Left Pain Descriptors / Indicators: Aching Pain Onset: On-going Pain Intervention(s): Rest;Relaxation;Emotional support;Repositioned;RN made aware;Medication (See eMAR) Vision/Perception  Perception Perception: Within Functional Limits Praxis Praxis: Intact  Cognition Overall Cognitive Status: History of cognitive impairments - at baseline Arousal/Alertness: Awake/alert Orientation Level: Oriented to person;Oriented to place;Oriented to time;Oriented to situation;Oriented X4 Attention: Focused;Sustained Focused  Attention: Appears intact Sustained Attention: Appears intact Memory: Impaired Memory Impairment: Decreased recall of new information;Decreased long term memory;Storage deficit;Retrieval deficit Decreased Short Term  Memory: Verbal basic;Functional basic Awareness: Impaired Awareness Impairment: Emergent impairment Problem Solving: Impaired Problem Solving Impairment: Verbal basic;Functional basic Safety/Judgment: Appears intact Sensation Sensation Light Touch: Appears Intact Hot/Cold: Not tested Proprioception: Appears Intact Stereognosis: Not tested Additional Comments: Reported her fingers feel numb "like they are asleep," reports this is new since surgery Coordination Gross Motor Movements are Fluid and Coordinated: No Fine Motor Movements are Fluid and Coordinated: Yes Coordination and Movement Description: generalized weakness, decreased L LE ROM and pain from L femur fracture Motor  Motor Motor: Other (comment) Motor - Skilled Clinical Observations: generalized weakness, decreased L LE ROM and pain from L femur fracture Motor - Discharge Observations: generalized weakness, decreased L LE ROM and strength with associated pain from L femur fracture  Mobility Bed Mobility Bed Mobility: Supine to Sit Supine to Sit: Supervision/Verbal cueing Transfers Transfers: Sit to Stand;Stand Pivot Transfers;Stand to Sit Sit to Stand: Contact Guard/Touching assist Stand to Sit: Contact Guard/Touching assist Stand Pivot Transfers: Contact Guard/Touching assist Stand Pivot Transfer Details: Verbal cues for safe use of DME/AE Transfer (Assistive device): Rolling walker Locomotion  Gait Ambulation: Yes Gait Assistance: Contact Guard/Touching assist Gait Distance (Feet): 150 Feet Assistive device: Rolling walker Gait Assistance Details: Verbal cues for gait pattern Gait Gait: Yes Gait Pattern: Impaired Gait Pattern: Decreased stance time - left;Decreased step length - right;Decreased  stride length;Decreased hip/knee flexion - left;Decreased weight shift to left;Left flexed knee in stance;Antalgic;Decreased trunk rotation;Narrow base of support;Trunk flexed;Decreased step length - left;Right flexed knee in stance Gait velocity: decreased Stairs / Additional Locomotion Stairs: Yes Stairs Assistance: Contact Guard/Touching assist Stair Management Technique: Two rails Number of Stairs: 5 Height of Stairs: 6 Ramp: Minimal Assistance - Patient >75% Wheelchair Mobility Wheelchair Mobility: Yes Wheelchair Assistance: Minimal assistance - Patient >75% Wheelchair Propulsion: Both upper extremities Wheelchair Parts Management: Needs assistance Distance: 13f  Trunk/Postural Assessment  Cervical Assessment Cervical Assessment: Exceptions to WFL(forward head) Thoracic Assessment Thoracic Assessment: Exceptions to WFL(kyphosis) Lumbar Assessment Lumbar Assessment: Exceptions to WFL(posterior pelvic tilt) Postural Control Postural Control: Deficits on evaluation(decreased/delayed)  Balance Balance Balance Assessed: Yes Static Sitting Balance Static Sitting - Balance Support: Feet supported;Bilateral upper extremity supported Static Sitting - Level of Assistance: 5: Stand by assistance Dynamic Sitting Balance Dynamic Sitting - Balance Support: During functional activity Dynamic Sitting - Level of Assistance: 5: Stand by assistance Static Standing Balance Static Standing - Balance Support: During functional activity;Bilateral upper extremity supported Static Standing - Level of Assistance: 4: Min assist;5: Stand by assistance Dynamic Standing Balance Dynamic Standing - Balance Support: During functional activity;Bilateral upper extremity supported Dynamic Standing - Level of Assistance: 5: Stand by assistance;4: Min assist Extremity Assessment      RLE Assessment RLE Assessment: Exceptions to [facility] Strength Comments: Grossly in sitting 4+/5 throughout LLE  Assessment LLE Assessment: Exceptions to W[facility] RenaissanceActive Range of Motion (AROM) Comments: limited hip flexion and knee flexion due to pain, increased hip internal rotation in all positions and with all mobility General Strength Comments: Grossly at least 3+/5 throughout, limited formal assessment due to L LE pain    Cherie L Grunenberg 07/06/2019, 5:15 PM  CPage Spiro PT, DPT 07/07/2019 7:51am

## 2019-07-05 NOTE — Progress Notes (Signed)
Physical Therapy Session Note  Patient Details  Name: Michelle Gregory MRN: [id number] Date of Birth: January 25, 1923  Today's Date: 07/05/2019 PT Individual Time:  -      Short Term Goals: Week 1:  PT Short Term Goal 1 (Week 1): STG=LTG due to short ELOS. PT Short Term Goal 1 - Progress (Week 1): Progressing toward goal  Skilled Therapeutic Interventions/Progress Updates:  Pt seated in w/c.  Dtr [name] here.  She explained that they plan to use a sturdy step stool on high entry step to decrease height she has to step up for 1 step.  Therapeutic exercise performed with LE to increase strength for functional mobility. Use of ketron at level 70 cm/sec x 30 cycles x 2 from w/c level, bil LEs.  Focus on R hip neutral abd/adduction. Seated R/L long arc quad knee extension; bil heel lifts, bil toe lifts  x 15 each. Standing with bil UEs support on heavy table: 10 x 1 L hip flexion, hip abduction.    Sit>< stand with min assist.  Basic transfer with min assist.  W/c propulsion using bil UEs on level tile x 50' with min assist for steering.  Gait training iwht RW on level tile x 100' with CGA, RW.  Cues for upright stance and foreward gaze.  Up/down (3) 6" high steps, bil raiils, CGA step-to method, descending backwards without cues.     Sit> supine with min assist for LLE.  At end of session, pt resting in bed with alarm set and needs at hand.  bil LLs on pillow for comfort, heels off of bed.  Therapy Documentation Precautions:  Precautions Precautions: Fall Restrictions Weight Bearing Restrictions: Yes LLE Weight Bearing: Weight bearing as tolerated  Pain: Pain Assessment Pain Scale: 0-10 Pain Score: 9 Pain Type: Surgical pain Pain Location: Leg Pain Orientation: Left Pain Descriptors / Indicators: Aching Pain Frequency: Intermittent Pain Onset: With Activity Patients Stated Pain Goal: 4 Pain Intervention(s): Medication (See eMAR)       Therapy/Group: Individual  Therapy  Kirah Stice 07/05/2019, 2:11 PM

## 2019-07-05 NOTE — Patient Care Conference (Signed)
Inpatient RehabilitationTeam Conference and Plan of Care Update Date: 07/05/2019   Time: 10:20 AM    Patient Name: Michelle Gregory      Medical Record Number: [id number]  Date of Birth: 11/07/1922 Sex: Female         Room/Bed: 4M06C/4M06C-01 Payor Info: Payor: MEDICARE RAILROAD / Plan: MEDICARE RAILROAD / Product Type: *No Product type* /    Admitting Diagnosis: 4. Gen Team  Hip FX; 13-15days   Admit Date/Time:  06/27/2019 11:29 AM Admission Comments: No comment available   Primary Diagnosis:  <principal problem not specified> Principal Problem: <principal problem not specified>  Patient Active Problem List   Diagnosis Date Noted  . Tachycardia with heart rate 100-120 beats per minute   . Hypokalemia   . Dementia with behavioral disturbance ([place])   . Senile dementia with delirium without behavioral disturbance ([place]) 06/27/2019  . E-coli UTI 06/27/2019  . Acute blood loss as cause of postoperative anemia 06/27/2019  . Pelvic fracture ([place]) 06/27/2019  . Subacute confusional state   . Closed displaced intertrochanteric fracture of left femur ([place]) 06/21/2019  . Anxiety with depression 06/21/2019  . Diarrhea 03/24/2019  . Delirium 02/01/2019  . Shingles 01/23/2019  . Right wrist pain 12/06/2018  . Laceration of wrist, right 12/06/2018  . Fracture, ulna, distal 12/06/2018  . Thrombocytopenia ([place]) 12/06/2018  . Dementia, senile ([place]) 11/22/2018  . Hand injury, left, initial encounter 09/01/2018  . H/O falling 05/03/2018  . Episodic confusion 05/03/2018  . Sacral fracture ([place]) 04/03/2018  . Fall at home 03/29/2018  . Anxiety disorder 03/29/2018  . Fatigue 03/29/2018  . Chronic pain 02/09/2018  . Cerumen impaction 12/06/2017  . H/O Clostridium difficile infection 10/13/2017  . Recurrent UTI (urinary tract infection) 09/06/2017  . Auditory hallucinations 03/03/2017  . Female cystocele 04/13/2016  . Bowel habit changes 12/30/2015  . Compression fracture 12/30/2015  . Routine  general medical examination at a health care facility 04/09/2015  . UTI (urinary tract infection) 05/09/2014  . Candidal intertrigo 03/28/2014  . Encounter for Medicare annual wellness exam 03/21/2013  . Gout 09/19/2012  . Age related osteoporosis 05/05/2011  . Degenerative lumbar disc 01/20/2011  . VARICOSE VEINS, LOWER EXTREMITIES 09/04/2010  . Prediabetes 01/08/2010  . Hyperlipidemia 05/09/2008  . Essential hypertension 05/09/2008  . HEMORRHOIDS, INTERNAL 05/09/2008  . Allergic rhinitis 05/09/2008  . Mild reactive airways disease 05/09/2008  . GERD 05/09/2008  . IBS 05/09/2008  . Osteoarthritis 05/09/2008  . History of malignant neoplasm of large intestine 05/09/2008    Expected Discharge Date: Expected Discharge Date: 07/08/19  Team Members Present: Physician leading conference: Dr. Courtney Heys Social Worker Present: Ovidio Kin, LCSW Nurse Present: Rayetta Humphrey, RN PT Present: Apolinar Junes, PT OT Present: Roanna Epley, [place], OT SLP Present: Stormy Fabian, SLP PPS Coordinator present : Gunnar Fusi, SLP     Current Status/Progress Goal Weekly Team Focus  Medical   dementia with additonal confusion; medically frail since 85 yr old; tx'd UTI with Fosfomycin  d/c home per pt request  d/c home   Bowel/Bladder   Continent of Bladder/bowe, LBM 07/04/19  Maintain continence  Address toileting needs QS and prn, provide stool softeners and laxatives   Swallow/Nutrition/ Hydration             ADL's   bed mobility and functional tranfsers-min A/mod A; functional amb with RW-min A; bathing-mod A; LB dressing-max A  supervision overall; LB dressing-CGA  education, activity tolerance, BADL training, funcitonal transfers, standing balance  Mobility   min A-CGA overall, gait up to 105 ft with RW, 1 step with min A using // bars  supervision bed mobility, CGA transfers and gait 50', min A 5 steps with B rails  strength, ROM, balance, functional mobility, activity  tolerance, patient/family education   Communication             Safety/Cognition/ Behavioral Observations  Min-Mod (fluctuating confusion)  Min  Orientation with aids, basic problem solving, awareness   Pain   Medicated with prn Norco and Tylenol as ordered,pain score 6/10  pain score < 3  Assess QS and prn maintaining pain as tolerated   Skin   Surgical left Hip after fall, healing well , skin glue to surgical site, swelling subsiding, tenderness remains  prevent post infection  Assess QS/PRN provide treatment to wound site, moitor for signs of infection and complications      *See Care Plan and progress notes for long and short-term goals.     Barriers to Discharge  Current Status/Progress Possible Resolutions Date Resolved   Physician    Decreased caregiver support;Home environment access/layout;Weight;Medication compliance;Lack of/limited family support;Incontinence  n/a  min assist overall with safety cues  safety, confusion/dementia      Nursing                  PT  Inaccessible home environment  Per patient's daughter there is a very large step to enter the home, reports the patient's son-in-law lifted her up into the house PTA  Recommended putting in a ramp to CSW and daughter for safer home access           OT                  SLP                SW                Discharge Planning/Teaching Needs:  HOme with daughter and son in-law daughter is here daily and will need to begin hands on care-training. Aware ramp is recommended for home      Team Discussion:  Progressing toward her goals of supervision-min level. Downgraded some ADL goals to min assist-CGA. Pain managed and cognitive issues at baseline. Didn't have a UTI. Will begin family educaiton with daughter in preparation for discharge Sat.  Revisions to Treatment Plan:  DC 9/5    Continued Need for Acute Rehabilitation Level of Care: The patient requires daily medical management by a physician with specialized  training in physical medicine and rehabilitation for the following conditions: Daily direction of a multidisciplinary physical rehabilitation program to ensure safe treatment while eliciting the highest outcome that is of practical value to the patient.: Yes Daily medical management of patient stability for increased activity during participation in an intensive rehabilitation regime.: Yes Daily analysis of laboratory values and/or radiology reports with any subsequent need for medication adjustment of medical intervention for : Post surgical problems;Mood/behavior problems;Blood pressure problems;Wound care problems;Other   I attest that I was present, lead the team conference, and concur with the assessment and plan of the team. Teleconference held due to COVID 19   Jesse Nosbisch, [place] 07/05/2019, 1:12 PM

## 2019-07-05 NOTE — Progress Notes (Signed)
Physical Therapy Session Note  Patient Details  Name: Michelle Gregory MRN: [id number] Date of Birth: 05-Jan-1923  Today's Date: 07/05/2019 PT Individual Time: 1035-1105 PT Individual Time Calculation (min): 30 min   Short Term Goals: Week 2:  PT Short Term Goal 1 (Week 2): STG=LTG due to short ELOS.  Skilled Therapeutic Interventions/Progress Updates:     Patient in bed with her daughter at bedside upon PT arrival. Patient alert and agreeable to PT session. Patient reported 2/10 L hip pain. PT provided repositioning, distractions, and rest breaks for pain interventions throughout session. Patients daughter was concerned about the patient "dragging" her L leg when walking yesterday evening due to fatigue. PT discussed patient's progress with ambulation during PT session yesterday and how she will experience muscle fatigue due to increased activity. Educated on energy conservation and pacing activity throughout the day at home with patient and her daughter. Education was well received.   Therapeutic Activity: Bed Mobility: Patient performed supine to sit with min A to facilitate LE and trunk motion due to patient having difficulty following cues this morning. Performed in a flat bed without use of rails. Patient stated after that she felt fatigued and mildly SOB after and stated "I need to use the rail to sit up." HR 122 sitting EOB after, recovered to 95 in <1 min. Provided verbal cues for sliding heels off the bed then using B elbows to push up to sitting. Transfers: Patient performed sit to/from stand x1 and stand pivot x1 with close supervision for safety. Provided verbal cues for hand placement on RW and reaching back to sit. Educated patient's daughter on safe guarding techniques and hand placement.   Gait Training:  Patient ambulated up/down a 6" step x2 with B HHA provided by PT and daughter to simulate stepping up on a step stool to enter her home with a large step in front. Patient then  performed stepping up/down 1 6" steps and a 4" step with B HHA on first step and RW on second step at top or bottom of steps x2 with min A-CGA. Provided cues for leading with the R when ascending steps and L when descending steps.    Wheelchair Mobility:  Patient was transported with total A in w/c during session for time management and energy conservation.   Patient in w/c at end of session with breaks locked, seat belt alarm set, and all needs within reach.    Therapy Documentation Precautions:  Precautions Precautions: Fall Restrictions Weight Bearing Restrictions: Yes LLE Weight Bearing: Weight bearing as tolerated    Therapy/Group: Individual Therapy  Leaner Morici L Mallory Schaad PT, DPT  07/05/2019, 4:32 PM

## 2019-07-05 NOTE — Progress Notes (Signed)
[facility] PHYSICAL MEDICINE & REHABILITATION PROGRESS NOTE   Subjective/Complaints:   Pt reports she needs assistance cutting up food- which I helped with. She perseverated on wanting to go home.   Notes she's ready for PT and OT, so she can get home.  ROS: Patient denies fever, rash, sore throat, blurred vision, nausea, vomiting,   cough, shortness of breath or chest pain,  headache, or mood change.     Objective:   No results found. Recent Labs    07/03/19 0645  WBC 6.0  HGB 9.4*  HCT 30.2*  PLT 186   Recent Labs    07/03/19 0517 07/05/19 0531  NA 138 140  K 4.4 4.0  CL 99 101  CO2 29 28  GLUCOSE 96 100*  BUN 18 20  CREATININE 0.75 0.73  CALCIUM 8.6* 8.5*   No intake or output data in the 24 hours ending 07/05/19 0852   Physical Exam: Vital Signs Blood pressure 130/74, pulse (!) 112, temperature 97.7 F (36.5 C), temperature source Oral, resp. rate 18, height 5' (1.524 m), weight 52.9 kg, last menstrual period 11/02/1980, SpO2 98 %.  Physical Exam  Constitutional: No distress . Vital signs reviewed. Sitting up in bed, eating breakfast- needed food cut; perseverating on going home, NAD HEENT: EOMI, oral membranes moist Neck: supple Cardiovascular: mildly tachycardic; regular rhythm, frustrated about food   Respiratory: CTA Bilaterally  GI: BS +, non-tender, non-distended  Musculoskeletal:     Comments: Left hip with less edema and tenderness and associated bruising Neurological:  Oriented to self and hospital, Ox2 HOH Motor: Bilateral upper extremities: 4/5 proximal distal Right lower extremity: 4-/5 hip flexion, knee extension, 4/5 ankle dorsiflexion Left lower extremity: 2/5 hip flexion, knee extension (ongoing pain inhibition), 3/5 ankle dorsiflexion  Skin: She is not diaphoretic.  Left hip incision C/D/I Left proximal lower extremity with bruising Psychiatric:frustrated about food- needing to cut it      Assessment/Plan: 1. Functional  deficits secondary to L  Hip fracture s/p IM nail WBAT  which require 3+ hours per day of interdisciplinary therapy in a comprehensive inpatient rehab setting.  Physiatrist is providing close team supervision and 24 hour management of active medical problems listed below.  Physiatrist and rehab team continue to assess barriers to discharge/monitor patient progress toward functional and medical goals  Care Tool:  Bathing    Body parts bathed by patient: Right arm, Left arm, Chest, Abdomen, Right upper leg, Left upper leg, Face   Body parts bathed by helper: Front perineal area, Buttocks, Right lower leg, Left lower leg     Bathing assist Assist Level: Moderate Assistance - Patient 50 - 74%     Upper Body Dressing/Undressing Upper body dressing   What is the patient wearing?: Button up shirt    Upper body assist Assist Level: Moderate Assistance - Patient 50 - 74%    Lower Body Dressing/Undressing Lower body dressing      What is the patient wearing?: Underwear/pull up, Pants     Lower body assist Assist for lower body dressing: Maximal Assistance - Patient 25 - 49%     Toileting Toileting    Toileting assist Assist for toileting: Total Assistance - Patient < 25%     Transfers Chair/bed transfer  Transfers assist     Chair/bed transfer assist level: Minimal Assistance - Patient > 75% Chair/bed transfer assistive device: Programmer, multimedia   Ambulation assist      Assist level: Contact Guard/Touching assist  Assistive device: Walker-rolling Max distance: 105'   Walk 10 feet activity   Assist  Walk 10 feet activity did not occur: Safety/medical concerns(decreased strenght/activity tolerance and pain)  Assist level: Contact Guard/Touching assist Assistive device: Walker-rolling   Walk 50 feet activity   Assist Walk 50 feet with 2 turns activity did not occur: Safety/medical concerns(decreased strenght/activity tolerance and  pain)  Assist level: Contact Guard/Touching assist Assistive device: Walker-rolling    Walk 150 feet activity   Assist Walk 150 feet activity did not occur: Safety/medical concerns(decreased strenght/activity tolerance and pain)         Walk 10 feet on uneven surface  activity   Assist Walk 10 feet on uneven surfaces activity did not occur: Safety/medical concerns(decreased strenght/activity tolerance and pain)         Wheelchair     Assist Will patient use wheelchair at discharge?: Yes Type of Wheelchair: Manual    Wheelchair assist level: Minimal Assistance - Patient > 75% Max wheelchair distance: 100'    Wheelchair 50 feet with 2 turns activity    Assist    Wheelchair 50 feet with 2 turns activity did not occur: Safety/medical concerns(decreased strenght/activity tolerance)   Assist Level: Minimal Assistance - Patient > 75%   Wheelchair 150 feet activity     Assist  Wheelchair 150 feet activity did not occur: Safety/medical concerns(decreased strength/activity tolerance and pain)       Blood pressure 130/74, pulse (!) 112, temperature 97.7 F (36.5 C), temperature source Oral, resp. rate 18, height 5' (1.524 m), weight 52.9 kg, last menstrual period 11/02/1980, SpO2 98 %.  ASSESSMENT and PLAN:  1.  Deficits with mobility, self-care, delirium, agitation secondary to left hip fracture status post IM nail. WBAT on LLE             -Continue CIR therapies including PT, OT  2.  Antithrombotics: -DVT/anticoagulation:  Pharmaceutical: Lovenox             -antiplatelet therapy: ASA 3. Chronic LBP/Pain Management: Continue gabapentin bid with Norco prn             reduced norco to 5/325 given AMS   -limit hydrocodone use as possible  -kpad for left hip  9/1- Kpad was leaking- nursing fixed 4. Mood: LCSW to follow for evaluation and support.              -antipsychotic agents: Continue ativan prn for agitation/sleep  -limit given confusion/age 89.  Neuropsych: This patient is not capable of making decisions on her own behalf. 6. Skin/Wound Care: Monitor wound daily for healing.  7. Fluids/Electrolytes/Nutrition: Monitor I/Os. Offer fluids frequently as getting dehydrated. BUN 22--->32 on last check.  BMP ordered for tomorrow a.m.  8/27- BUN/Cr OK 8/26- labs again Fri. 8. HTN: Continue Metoprolol bid and Cozaar daily.  Monitor with increased mobility..    -8/30: reduce cozaar to 50mg  daily for soft BP's 9. Delirum: Question dementia worsened in hospital setting  -improved with reduction in hydrocodone  -rx'ed potential UTI as below  -still some HS confusion  - zyprexa prn at bedtime   -continue to monitor  8/31- still sleepy early in AM- working with therapy; con't meds  10. Diarrhea/loose stools: Has history of C diff/colon cancer.   -Discontinued Senokot.   -held allopurinol 8/29  -continue florastor and metamucil  -encourage adequate PO intake  -no signs of infectious diarrhea at present  -check labs tomorrow  8/31- BUN 18; Cr nml; K+ 4.4  9/2- labs nml-  K+ 4.0; Bun 20; Cr 0.73 11. ABLA: Continue to monitor with serial checks.  CBC ordered for tomorrow a.m. 12. Thrombocytopenia: Monitor with serial checks as on Lovenox. Monitor for signs of bleeding.     -platelets 123 8/28  -recheck Monday. 13.  E coli UTI: Completed 5 days of rocephin --recheck urine culture for clearing due to ongoing delirium. Monitor WBC/temps for signs of infection.   14. Hypokalemia  8/26- will replete her K+ from 3.1 today- will give KCl x 2 doses 40 mEq and recheck BMP.   8/28- K+ improved  -labs tomorrow 8/31  8/31- K+ 4.4  9/2- K+ 4.0 15. Hypocalcemia  8/26- Ca 7.8--replete 16. Possible UTI- UA suspicious  -ucs still pending from 8/28  -Fosfomycin  X 1 on 8/28 since allergic to Bactrim and PCN.  -afebrile  8/31- U Cx grew nothing- <10k   17. Tachycardia  9/2- has been going up gradually over last 2-3 days- was in 90s- up to 112 in last 24  hours- on Lovenox, so unlikely has DVT/PE- sats 98+% on RA- will monitor closely. NOT DRY. LOS: 8 days A FACE TO FACE EVALUATION WAS PERFORMED  Michelle Gregory 07/05/2019, 8:52 AM

## 2019-07-05 NOTE — Progress Notes (Signed)
Speech Language Pathology Daily Session Note  Patient Details  Name: Michelle Gregory MRN: [id number] Date of Birth: 1923/01/22  Today's Date: 07/05/2019 SLP Individual Time: 1131-1201 SLP Individual Time Calculation (min): 30 min  Short Term Goals: Week 1: SLP Short Term Goal 1 (Week 1): Pt recall basic biographical and/or daily information with Mod A for use of external aids. SLP Short Term Goal 2 (Week 1): Pt will orient to place, time, and situation with Mod A multimodal cues. SLP Short Term Goal 3 (Week 1): Pt will complete basic, familiar tasks with Mod A cues for problem solving. SLP Short Term Goal 4 (Week 1): Pt will demonstrate intellectual awareness by stating at least 1 physical and 1 cognitive deficit with Mod A cues.  Skilled Therapeutic Interventions:  Skilled treatment session focused on cognition goals and education with pt's daughter. SLP facilitated session by providing Total A to utilize monthly calendar to locate date d/t reported vision deficits. Pt unable to utilize calendar. Daughter present and education provided on creating routine/schedule of daily activities, use of large print calendar for orientation and created list of activities that pt might enjoy performing such as folding clothes at seated level. Pt's daughter had questions regarding follow-up appointments. All answered to this writer's ability. Pt left upright in wheelchair, lap belt alarm on and all needs within reach. Continue per current plan of care.      Pain    Therapy/Group: Individual Therapy  Andi Mahaffy 07/05/2019, 12:12 PM

## 2019-07-05 NOTE — Progress Notes (Signed)
Occupational Therapy Session Note  Patient Details  Name: Michelle Gregory MRN: [id number] Date of Birth: [birth year]  Today's Date: 07/05/2019 OT Individual Time: [id number] OT Individual Time Calculation (min): 60 min    Short Term Goals: Week 2:  OT Short Term Goal 1 (Week 2): Pt will perform sit to stands with min cues for proper hand placement in prep for ADL OT Short Term Goal 2 (Week 2): Pt will perform toileting tasks with mod A OT Short Term Goal 3 (Week 2): Pt will don LB clothing with min A with AE prn OT Short Term Goal 4 (Week 2): Pt will complete bathing tasks with min A sit<>stand at sink of shower  Skilled Therapeutic Interventions/Progress Updates:    Pt resting in bed upon arrival and initially declined to get OOB and don clothing.  Pt declined bathing this morning because she was cold.  Pt agreed after encourgement to change clothing.  OT intervention with focus on bed mobility, sit<>stand, standing balance, toilting, activity tolerance, and safety awareness to increase independence with BADLs.  Pt required min A for supine>sit EOB.  Pt required assistance with threading pants but able to pull pants over hips while standing.  Pt required assistance with donning socks.  Pt required assistance fastening buttons on shirt.  Pt amb with RW (CGA) to sink to brush teeth while standing at sink.  Pt stated she needed to use toilet but was incontinent of bowel before sitting on BSC.  Pt requires tot A for toileting tasks.  Pt stood at sink to brush teeth. Pt amb with back to bed and required assistance lifting LLE onto bed.  Pt required min A for repositioning in bed.  Pt remained in bed with all needs within reach and bed alarm activated.   Therapy Documentation Precautions:  Precautions Precautions: Fall Restrictions Weight Bearing Restrictions: Yes LLE Weight Bearing: Weight bearing as tolerated   Pain:  Pt c/o ongoing L hip pain with activity (unrated);  repositioned   Therapy/Group: Individual Therapy  Leroy Libman 07/05/2019, 10:52 AM

## 2019-07-05 NOTE — Plan of Care (Signed)
  Problem: RH Dressing Goal: LTG Patient will perform lower body dressing w/assist (OT) Description: LTG: Patient will perform lower body dressing with assist, with/without cues in positioning using equipment (OT) Flowsheets (Taken 07/05/2019 1445) LTG: Pt will perform lower body dressing with assistance level of: (downgraded due to slow progress) Minimal Assistance - Patient > 75% Note: downgraded due to slow progress   Problem: RH Toileting Goal: LTG Patient will perform toileting task (3/3 steps) with assistance level (OT) Description: LTG: Patient will perform toileting task (3/3 steps) with assistance level (OT)  Flowsheets (Taken 07/05/2019 1445) LTG: Pt will perform toileting task (3/3 steps) with assistance level: (downgraded due to slow progress) Moderate Assistance - Patient 50 - 74% Note: downgraded due to slow progress

## 2019-07-05 NOTE — Progress Notes (Signed)
Social Work Patient ID: Michelle Gregory, female   DOB: 1923-06-13, 85 y.o.   MRN: [id number]     Diagnosis codes: S72.142A, S32.9XXA & M19.90  Height: 5'0          Weight:  119 lbs          Patient suffers from L-femur fx and Pelvic Fx   which impairs her ability to perform daily activities like ADL's and tolieting   in the home.  A walker  will not resolve issue with performing activities of daily living.  A wheelchair will allow patient to safely perform daily activities.  Patient is not able to propel themselves in the home using a standard weight wheelchair due to endurance and fatigue .  Patient can self propel in the lightweight wheelchair.

## 2019-07-05 NOTE — Progress Notes (Signed)
Social Work Patient ID: Michelle Gregory, female   DOB: 1923-09-30, 85 y.o.   MRN: [id number]  Met with pt and daughter to discuss team conference goals CGA-min assist level and discharge date 9/5. Pt states: " That is the best news I have heard all day."  Daughter has begun family education today and will be here tomorrow to go through again. Daughter reports husband is working on a ramp for the large step into home. Discussed equipment needs and follow up.

## 2019-07-06 ENCOUNTER — Inpatient Hospital Stay (HOSPITAL_COMMUNITY): Payer: MEDICARE | Admitting: Speech Pathology

## 2019-07-06 ENCOUNTER — Inpatient Hospital Stay (HOSPITAL_COMMUNITY): Payer: MEDICARE

## 2019-07-06 ENCOUNTER — Inpatient Hospital Stay (HOSPITAL_COMMUNITY): Payer: MEDICARE | Admitting: Physical Therapy

## 2019-07-06 NOTE — Progress Notes (Signed)
Occupational Therapy Session Note  Patient Details  Name: Michelle Gregory MRN: [id number] Date of Birth: Jan 12, 1923  Today's Date: 07/06/2019 OT Individual Time: [id number] OT Individual Time Calculation (min): 70 min    Short Term Goals: Week 2:  OT Short Term Goal 1 (Week 2): Pt will perform sit to stands with min cues for proper hand placement in prep for ADL OT Short Term Goal 2 (Week 2): Pt will perform toileting tasks with mod A OT Short Term Goal 3 (Week 2): Pt will don LB clothing with min A with AE prn OT Short Term Goal 4 (Week 2): Pt will complete bathing tasks with min A sit<>stand at sink of shower  Skilled Therapeutic Interventions/Progress Updates:    Pt resting in w/c upon arrival.  Pt oriented to person, place, and situation.  Pt recalled that she was discharging home on Saturday.  OT intervention with focus on functional amb with RW, sit<>stand, standing balance, activity tolerance, ongoing educaiton, and safety awareness. Pt engaged in standing tasks involving Cornhole and hanging clothes pins on basketball net.  Pt able to toss bean bags overhand without LOB.  Pt able to reach outside BOS while hanging clothes pins.  Pt amb with RW in Day Room and Gym at supervision level.  Pt required CGA for dynamic standing tasks.  Pt requires multiple rest breaks during session.  Pt's daughter present upon return to room.  Reinforced recommendation regarding bathing.  Recommended that pt not take tub baths in the future.  Pt's daughter verbalized understanding of recommendation. Pt remained in w/c with all needs within reach, belt alarm activated, and daughter present.   Therapy Documentation Precautions:  Precautions Precautions: Fall Restrictions Weight Bearing Restrictions: Yes LLE Weight Bearing: Weight bearing as tolerated  Pain:  Pt denies pain this morning   Therapy/Group: Individual Therapy  Leroy Libman 07/06/2019, 12:04 PM

## 2019-07-06 NOTE — Progress Notes (Signed)
Speech Language Pathology Daily Session Note  Patient Details  Name: Michelle Gregory MRN: [id number] Date of Birth: 1923/05/11  Today's Date: 07/06/2019 SLP Individual Time: 1302-1402 SLP Individual Time Calculation (min): 60 min  Short Term Goals: Week 1: SLP Short Term Goal 1 (Week 1): Pt recall basic biographical and/or daily information with Mod A for use of external aids. SLP Short Term Goal 2 (Week 1): Pt will orient to place, time, and situation with Mod A multimodal cues. SLP Short Term Goal 3 (Week 1): Pt will complete basic, familiar tasks with Mod A cues for problem solving. SLP Short Term Goal 4 (Week 1): Pt will demonstrate intellectual awareness by stating at least 1 physical and 1 cognitive deficit with Mod A cues.  Skilled Therapeutic Interventions:  Skilled treatment session focused on cognition goals. SLP facilitiated session by providing Mod A for sustained attention to basic task of sorting beads by color. Pt with off-topic comments and questions and then required cues to re-initiate task. Pt's daughter present and assisted with tray set-up. Pt able to recall number of days until discharge but not able to recall daily information from this day. During off-topic comments, pt demonstrated decreased recall of past events. Pt left upright in wheelchair, lap belt alarm in place, daughter present and all needs within reach. Continue per current plan of care.      Pain Pain Assessment Pain Scale: 0-10 Pain Score: 0-No pain  Therapy/Group: Individual Therapy  Aubreyanna Dorrough 07/06/2019, 2:03 PM

## 2019-07-06 NOTE — Progress Notes (Signed)
Physical Therapy Session Note  Patient Details  Name: Michelle Gregory MRN: [id number] Date of Birth: 02-Dec-1922  Today's Date: 07/06/2019 PT Missed Time: 45 Minutes Missed Time Reason: Patient fatigue    Upon therapist arrival patient was asleep, supine in bed. Partially awakens to her name, but keeps her eyes closed, and upon encouragement to participate in therapy session she states she doesn't want to get up this morning. Reoriented patient to the time and location and once she remembered she was at CIR she continued to defer therapy requesting to "just lay down and go to sleep." Patient left supine in bed with needs in reach and bed alarm on.   Tawana Scale, PT, DPT 07/06/2019, 6:18 PM

## 2019-07-06 NOTE — Progress Notes (Signed)
Physical Therapy Session Note  Patient Details  Name: Michelle Gregory MRN: [id number] Date of Birth: 05/09/1923  Today's Date: 07/06/2019 PT Individual Time: 0925-1025 PT Individual Time Calculation (min): 60 min   Short Term Goals: Week 2:  PT Short Term Goal 1 (Week 2): STG=LTG due to short ELOS.  Skilled Therapeutic Interventions/Progress Updates:     Patient on [facility] with NT upon PT arrival. Patient alert and agreeable to PT session. Patient reported 3/10 L hip pain during session, reported that she received pain medicine prior to session. PT provided repositioning, rest breaks, and distraction as pain interventions throughout session. Patient with increased confusion about time and place, but pleasant affect and verbose this morning. She was able to recall that she is going home on Saturday and is looking forward to being home.   Therapeutic Activity: Transfers: Patient performed sit to/from stand x1, toilet transfers from [facility] x1 and from toilet x1 with CGA with RW and a car transfer with min A for LE managment.  Reclined seat in car to allow increased clearance for LE to come into the car due to decreased L hip flexion. Provided verbal cues for hand placement on RW x2, patient able to recall without cueing x2, reaching back to sit with each transfer, and leaning back in the car seat to bring her LEs into the vehicle. During the car transfer the patient had difficulty following cues and slide forward in the seat when leaning back, required min A for scooting back once LEs where in the vehicle.  Required total A for peri care and mod A for LB dressing during toileting. During toileting patient expressed that she felt urgency when needing to have a BM and was incontinent of bowl before she was able to ambulate to the toilet. She became frustrated with this and PT comforted patient and provided education on managing BMs with urgency using a BSC and a bowl program.   Gait Training:  Patient ambulated  65 feet using RW with CGA and w/c follow. Ambulated with decreased gait speed, decreased step length R>L, decreased weight shift to L, antalgic gait on L, increased L hip internal rotation, increased hip and knee flexion, forward trunk lean, and downward head gaze. Provided verbal cues for erect posture, looking ahead, and L hip rotation to neutral, patient was more aware of hip internal rotation today, pointing it out to therapist.  Wheelchair Mobility:  Patient propelled wheelchair 55 feet with supervision using B UEs. Provided verbal cues for propulsion technique. Required total A for management of leg rests and breaks throughout session.   Patient in w/c at end of session with breaks locked, seat belt alarm set, and all needs within reach.    Therapy Documentation Precautions:  Precautions Precautions: Fall Restrictions Weight Bearing Restrictions: Yes LLE Weight Bearing: Weight bearing as tolerated    Therapy/Group: Individual Therapy  Ebunoluwa Gernert L Madalin Hughart PT, DPT  07/06/2019, 3:58 PM

## 2019-07-06 NOTE — Progress Notes (Signed)
[facility] PHYSICAL MEDICINE & REHABILITATION PROGRESS NOTE   Subjective/Complaints:   Pt reports supposedly didn't sleep well overnight- was scared there would be a storm- per sleep chart, slept from 11pm to 7:30 am. Just woke up and has headache, per pt. Has no other complaints.  Per nursing, just giving AM meds and getting her set up to eat. Said she can't cut food- "I don't want to be a bother; I'm not lazy".  Per  Nurse, hip incision looking great.  ROS: Patient denies fever, rash, sore throat, blurred vision, nausea, vomiting,   cough, shortness of breath or chest pain,   or mood change.     Objective:   No results found. No results for input(s): WBC, HGB, HCT, PLT in the last 72 hours. Recent Labs    07/05/19 0531  NA 140  K 4.0  CL 101  CO2 28  GLUCOSE 100*  BUN 20  CREATININE 0.73  CALCIUM 8.5*    Intake/Output Summary (Last 24 hours) at 07/06/2019 1000 Last data filed at 07/06/2019 0938 Gross per 24 hour  Intake 360 ml  Output -  Net 360 ml     Physical Exam: Vital Signs Blood pressure (!) 138/101, pulse 94, temperature 97.6 F (36.4 C), temperature source Oral, resp. rate 18, height 5' (1.524 m), weight 52.7 kg, last menstrual period 11/02/1980, SpO2 100 %.  Physical Exam  Constitutional: No distress . Vital signs reviewed. Sitting up in bed, just woke up; holding head intermittently; c/o being sleepy- appears a little more vague/out of it than normal, NAD HEENT: EOMI, oral membranes moist Neck: supple Cardiovascular: mildly tachycardic; regular rhythm, frustrated about food   Respiratory: CTA Bilaterally  GI: BS +, non-tender, non-distended  Musculoskeletal:     Comments: Left hip with less edema and tenderness and associated bruising Neurological:  Oriented to self and hospital, Ox2 HOH Motor: Bilateral upper extremities: 4/5 proximal distal Right lower extremity: 4-/5 hip flexion, knee extension, 4/5 ankle dorsiflexion Left lower extremity: 2/5  hip flexion, knee extension (ongoing pain inhibition), 3/5 ankle dorsiflexion  Skin: She is not diaphoretic.  Left hip incision C/D/I Left proximal lower extremity with bruising Psychiatric:frustrated about food- needing to cut it      Assessment/Plan: 1. Functional deficits secondary to L  Hip fracture s/p IM nail WBAT  which require 3+ hours per day of interdisciplinary therapy in a comprehensive inpatient rehab setting.  Physiatrist is providing close team supervision and 24 hour management of active medical problems listed below.  Physiatrist and rehab team continue to assess barriers to discharge/monitor patient progress toward functional and medical goals  Care Tool:  Bathing  Bathing activity did not occur: Refused Body parts bathed by patient: Right arm, Left arm, Chest, Abdomen, Right upper leg, Left upper leg, Face   Body parts bathed by helper: Front perineal area, Buttocks, Right lower leg, Left lower leg     Bathing assist Assist Level: Moderate Assistance - Patient 50 - 74%     Upper Body Dressing/Undressing Upper body dressing   What is the patient wearing?: Button up shirt    Upper body assist Assist Level: Moderate Assistance - Patient 50 - 74%    Lower Body Dressing/Undressing Lower body dressing      What is the patient wearing?: Underwear/pull up, Pants     Lower body assist Assist for lower body dressing: Moderate Assistance - Patient 50 - 74%     Toileting Toileting    Toileting assist Assist for  toileting: Maximal Assistance - Patient 25 - 49%     Transfers Chair/bed transfer  Transfers assist     Chair/bed transfer assist level: Contact Guard/Touching assist Chair/bed transfer assistive device: Programmer, multimedia   Ambulation assist      Assist level: Contact Guard/Touching assist Assistive device: Walker-rolling Max distance: 100   Walk 10 feet activity   Assist  Walk 10 feet activity did not occur:  Safety/medical concerns(decreased strenght/activity tolerance and pain)  Assist level: Contact Guard/Touching assist Assistive device: Walker-rolling   Walk 50 feet activity   Assist Walk 50 feet with 2 turns activity did not occur: Safety/medical concerns(decreased strenght/activity tolerance and pain)  Assist level: Contact Guard/Touching assist Assistive device: Walker-rolling    Walk 150 feet activity   Assist Walk 150 feet activity did not occur: Safety/medical concerns(decreased strenght/activity tolerance and pain)         Walk 10 feet on uneven surface  activity   Assist Walk 10 feet on uneven surfaces activity did not occur: Safety/medical concerns(decreased strenght/activity tolerance and pain)         Wheelchair     Assist Will patient use wheelchair at discharge?: Yes Type of Wheelchair: Manual    Wheelchair assist level: Minimal Assistance - Patient > 75% Max wheelchair distance: 50    Wheelchair 50 feet with 2 turns activity    Assist    Wheelchair 50 feet with 2 turns activity did not occur: Safety/medical concerns(decreased strenght/activity tolerance)   Assist Level: Minimal Assistance - Patient > 75%   Wheelchair 150 feet activity     Assist  Wheelchair 150 feet activity did not occur: Safety/medical concerns(decreased strength/activity tolerance and pain)       Blood pressure (!) 138/101, pulse 94, temperature 97.6 F (36.4 C), temperature source Oral, resp. rate 18, height 5' (1.524 m), weight 52.7 kg, last menstrual period 11/02/1980, SpO2 100 %.  ASSESSMENT and PLAN:  1.  Deficits with mobility, self-care, delirium, agitation secondary to left hip fracture status post IM nail. WBAT on LLE             -Continue CIR therapies including PT, OT  2.  Antithrombotics: -DVT/anticoagulation:  Pharmaceutical: Lovenox             -antiplatelet therapy: ASA 3. Chronic LBP/Pain Management: Continue gabapentin bid with Norco prn              reduced norco to 5/325 given AMS   -limit hydrocodone use as possible  -kpad for left hip  9/1- Kpad was leaking- nursing fixed 4. Mood: LCSW to follow for evaluation and support.              -antipsychotic agents: Continue ativan prn for agitation/sleep  -limit given confusion/age 51. Neuropsych: This patient is not capable of making decisions on her own behalf. 6. Skin/Wound Care: Monitor wound daily for healing.  7. Fluids/Electrolytes/Nutrition: Monitor I/Os. Offer fluids frequently as getting dehydrated. BUN 22--->32 on last check.  BMP ordered for tomorrow a.m.  8/27- BUN/Cr OK 8/26- labs again Fri. 8. HTN: Continue Metoprolol bid and Cozaar daily.  Monitor with increased mobility..    -8/30: reduce cozaar to 50mg  daily for soft BP's 9. Delirum: Question dementia worsened in hospital setting  -improved with reduction in hydrocodone  -rx'ed potential UTI as below  -still some HS confusion  - zyprexa prn at bedtime   -continue to monitor  8/31- still sleepy early in AM- working with therapy; con't meds  10. Diarrhea/loose stools: Has history of C diff/colon cancer.   -Discontinued Senokot.   -held allopurinol 8/29  -continue florastor and metamucil  -encourage adequate PO intake  -no signs of infectious diarrhea at present  -check labs tomorrow  8/31- BUN 18; Cr nml; K+ 4.4  9/2- labs nml- K+ 4.0; Bun 20; Cr 0.73 11. ABLA: Continue to monitor with serial checks.  CBC ordered for tomorrow a.m. 12. Thrombocytopenia: Monitor with serial checks as on Lovenox. Monitor for signs of bleeding.     -platelets 123 8/28  -recheck Monday. 13.  E coli UTI: Completed 5 days of rocephin --recheck urine culture for clearing due to ongoing delirium. Monitor WBC/temps for signs of infection.   14. Hypokalemia  8/26- will replete her K+ from 3.1 today- will give KCl x 2 doses 40 mEq and recheck BMP.   8/28- K+ improved  -labs tomorrow 8/31  8/31- K+ 4.4  9/2- K+ 4.0 15.  Hypocalcemia  8/26- Ca 7.8--replete 16. Possible UTI- UA suspicious  -ucs still pending from 8/28  -Fosfomycin  X 1 on 8/28 since allergic to Bactrim and PCN.  -afebrile  8/31- U Cx grew nothing- <10k   17. Tachycardia  9/2- has been going up gradually over last 2-3 days- was in 90s- up to 112 in last 24 hours- on Lovenox, so unlikely has DVT/PE- sats 98+% on RA- will monitor closely. NOT DRY.  9/3- Pulse back down to 94 this AM.  LOS: 9 days A FACE TO FACE EVALUATION WAS PERFORMED  Kazue Cerro 07/06/2019, 10:00 AM

## 2019-07-07 ENCOUNTER — Telehealth: Payer: Self-pay | Admitting: Family Medicine

## 2019-07-07 ENCOUNTER — Inpatient Hospital Stay (HOSPITAL_COMMUNITY): Payer: MEDICARE | Admitting: Physical Therapy

## 2019-07-07 ENCOUNTER — Inpatient Hospital Stay (HOSPITAL_COMMUNITY): Payer: MEDICARE | Admitting: Occupational Therapy

## 2019-07-07 ENCOUNTER — Inpatient Hospital Stay (HOSPITAL_COMMUNITY): Payer: MEDICARE

## 2019-07-07 ENCOUNTER — Inpatient Hospital Stay (HOSPITAL_COMMUNITY): Payer: MEDICARE | Admitting: Speech Pathology

## 2019-07-07 MED ORDER — HYDROCODONE-ACETAMINOPHEN 5-325 MG PO TABS: 1.0000 | ORAL_TABLET | Freq: Three times a day (TID) | ORAL | 0 refills | Status: DC | PRN

## 2019-07-07 MED ORDER — PSYLLIUM 95 % PO PACK: 1.0000 | PACK | Freq: Every day | ORAL | 0 refills | Status: DC

## 2019-07-07 MED ORDER — SACCHAROMYCES BOULARDII 250 MG PO CAPS: 250.0000 mg | ORAL_CAPSULE | Freq: Two times a day (BID) | ORAL | Status: DC

## 2019-07-07 MED ORDER — ACETAMINOPHEN 325 MG PO TABS: 325.0000 mg | ORAL_TABLET | ORAL | Status: DC | PRN

## 2019-07-07 MED ORDER — ALLOPURINOL 100 MG PO TABS: 100.0000 mg | ORAL_TABLET | Freq: Every day | ORAL | 1 refills | Status: DC

## 2019-07-07 MED ORDER — LOSARTAN POTASSIUM 50 MG PO TABS: 50.0000 mg | ORAL_TABLET | Freq: Every day | ORAL | 0 refills | Status: DC

## 2019-07-07 MED ORDER — OLANZAPINE 5 MG PO TBDP: 5.0000 mg | ORAL_TABLET | Freq: Two times a day (BID) | ORAL | 0 refills | Status: DC | PRN

## 2019-07-07 NOTE — Telephone Encounter (Signed)
I reviewed her d/c summary and am ok with the med changes made.. Thanks for the heads up

## 2019-07-07 NOTE — Progress Notes (Signed)
Occupational Therapy Session Note  Patient Details  Name: Michelle Gregory MRN: [id number] Date of Birth: [birth year]  Today's Date: 07/07/2019 OT Individual Time: 1300-1326 OT Individual Time Calculation (min): 26 min    Short Term Goals: Week 2:  OT Short Term Goal 1 (Week 2): Pt will perform sit to stands with min cues for proper hand placement in prep for ADL OT Short Term Goal 2 (Week 2): Pt will perform toileting tasks with mod A OT Short Term Goal 3 (Week 2): Pt will don LB clothing with min A with AE prn OT Short Term Goal 4 (Week 2): Pt will complete bathing tasks with min A sit<>stand at sink of shower  Skilled Therapeutic Interventions/Progress Updates:    Pt in bed eating lunch upon arrival with daughter present.  OT intervention with focus on bed mobility, functional amb with RW, toileting, discharge planning, and ongoing family education.  Pt's daughter provides the appropriate level of supervision.  Daughter states that family was providing some assistance with dressing tasks prior to admission.  Pt and daughter pleased with progress and ready for discharge tomorrow.   Therapy Documentation Precautions:  Precautions Precautions: Fall Restrictions Weight Bearing Restrictions: Yes LLE Weight Bearing: Weight bearing as tolerated  Pain:  Pt denies pain this afternoon   Therapy/Group: Individual Therapy  Leroy Libman 07/07/2019, 1:27 PM

## 2019-07-07 NOTE — Progress Notes (Signed)
Occupational Therapy Discharge Summary  Patient Details  Name: Michelle Gregory MRN: [id number] Date of Birth: 08/23/1923  Patient has met 8 of 8 long term goals due to improved activity tolerance, improved balance, postural control and ability to compensate for deficits.  Pt made steady progress with BADLs during this admission.  Pt requires min A for LB dressing tasks and mod A for toileting tasks. Pt is supervision for bathing and toilet transfers with RW.  Pt fatigues quickly and requires multiple rest breaks. Pt oriented to person, place, and situation.  Pt has history of cognitive deficits. Pt's daughter has been present and provides appropriate level of supervisoin/assistance.  Patient to discharge at [facility] Assist level.  Patient's care partner is independent to provide the necessary physical and cognitive assistance at discharge.    Recommendation:  Patient will benefit from ongoing skilled OT services in home health setting to continue to advance functional skills in the area of BADL and Reduce care partner burden.  Equipment: BSC  Reasons for discharge: treatment goals met and discharge from hospital  Patient/family agrees with progress made and goals achieved: Yes  OT Discharge Vision Baseline Vision/History: Wears glasses Wears Glasses: At all times Patient Visual Report: No change from baseline Vision Assessment?: No apparent visual deficits Perception  Perception: Within Functional Limits Praxis Praxis: Intact Cognition Overall Cognitive Status: History of cognitive impairments - at baseline Orientation Level: Oriented to person;Oriented to place;Oriented to situation;Disoriented to time Focused Attention: Appears intact Sustained Attention: Appears intact Memory: Impaired Memory Impairment: Decreased recall of new information;Decreased long term memory;Storage deficit;Retrieval deficit Decreased Short Term Memory: Verbal basic;Functional basic Awareness  Impairment: Emergent impairment Problem Solving: Impaired Problem Solving Impairment: Verbal basic;Functional basic Safety/Judgment: Appears intact Sensation Sensation Light Touch: Appears Intact Hot/Cold: Appears Intact Proprioception: Appears Intact Stereognosis: Not tested Additional Comments: Reported her fingers feel numb "like they are asleep," reports this is new since surgery Coordination Gross Motor Movements are Fluid and Coordinated: No Fine Motor Movements are Fluid and Coordinated: Yes Motor  Motor Motor - Skilled Clinical Observations: generalized weakness, decreased L LE ROM and pain from L femur fracture    Trunk/Postural Assessment  Cervical Assessment Cervical Assessment: Exceptions to WFL(forward head) Thoracic Assessment Thoracic Assessment: (kyphosis) Lumbar Assessment Lumbar Assessment: (posterior pelvic tilt) Postural Control Righting Reactions: delayed  Balance Static Sitting Balance Static Sitting - Balance Support: Feet supported;Left upper extremity supported;Right upper extremity supported Static Sitting - Level of Assistance: 5: Stand by assistance Dynamic Sitting Balance Dynamic Sitting - Balance Support: During functional activity Dynamic Sitting - Level of Assistance: 5: Stand by assistance Extremity/Trunk Assessment RUE Assessment Active Range of Motion (AROM) Comments: pt reports discomfot with shoulder flexion>90 degrees General Strength Comments: Grossly in sitting 4/5 throughout LUE Assessment Active Range of Motion (AROM) Comments: pt resports discomfort with shoulder flexion>90 degrees General Strength Comments: Grossly in sitting 4/5 throughout   Leroy Libman 07/07/2019, 6:31 AM

## 2019-07-07 NOTE — Progress Notes (Signed)
Social Work Discharge Note   The overall goal for the admission was met for: DC SAT 9/5  Discharge location: Yes-HOME WITH DAUGHTER AND SON IN-[place]-24 HR CARE  Length of Stay: Yes-11 DAYS  Discharge activity level: Yes-CGA-MIN LEVEL  Home/community participation: Yes  Services provided included: MD, RD, PT, OT, SLP, RN, CM, Pharmacy and SW  Financial Services: Medicare and Private Insurance: [facility]  Follow-up services arranged: Home Health: [place], DME: [facility] B[facility] & 3 IN 1 and Patient/Family has no preference for HH/DME agencies  Comments (or additional information):DAUGHTER HAAS BEEN HERE DAILY AND PARTICIPATING IN THERAPIES. FEELS COMFORTABLE BETWEEN SHE AND HER HUSBAND CAN PROVIDE 24 HR CARE OF PT  Patient/Family verbalized understanding of follow-up arrangements: Yes  Individual responsible for coordination of the follow-up plan: [name]-DAUGHTER  Confirmed correct DME delivered: Elease Hashimoto 07/07/2019    Elease Hashimoto

## 2019-07-07 NOTE — Discharge Summary (Signed)
Physician Discharge Summary  Patient ID: CRYSAL TOALSON MRN: [id number] DOB/AGE: 83-May-1924 83 y.o.  Admit date: 06/27/2019 Discharge date: 07/08/2019  Discharge Diagnoses:  Principal Problem:   Closed displaced intertrochanteric fracture of left femur [facility]) Active Problems:   Essential hypertension   Pelvic fracture (HCC)   Subacute confusional state   Dementia with behavioral disturbance (HCC)   Anemia   Discharged Condition: stable   Significant Diagnostic Studies:N/A   Labs:  Basic Metabolic Panel: BMP Latest Ref Rng & Units 07/05/2019 07/03/2019 06/30/2019  Glucose 70 - 99 mg/dL 100(H) 96 165(H)  BUN 8 - 23 mg/dL 20 18 19   Creatinine 0.44 - 1.00 mg/dL 0.73 0.75 0.76  Sodium 135 - 145 mmol/L 140 138 136  Potassium 3.5 - 5.1 mmol/L 4.0 4.4 4.1  Chloride 98 - 111 mmol/L 101 99 100  CO2 22 - 32 mmol/L 28 29 27   Calcium 8.9 - 10.3 mg/dL 8.5(L) 8.6(L) 8.5(L)    CBC: CBC Latest Ref Rng & Units 07/03/2019 06/30/2019 06/28/2019  WBC 4.0 - 10.5 K/uL 6.0 5.8 8.1  Hemoglobin 12.0 - 15.0 g/dL 9.4(L) 9.3(L) 9.2(L)  Hematocrit 36.0 - 46.0 % 30.2(L) 28.4(L) 28.1(L)  Platelets 150 - 400 K/uL 186 123(L) 150    CBG: No results for input(s): GLUCAP in the last 168 hours.  Brief HPI:   Michelle Gregory is a 85 y.o. female with history of GERD, anxiety, HTN, colon cancer, episodic confusion, fall 06/21/19 with subsequent left intratrochanteric femur fracture and difficulty walking.  She underwent IM nailing of left femur on zero 8/20 and is weightbearing as tolerated.  Hospital course significant for a BLA, leukocytosis, electrolyte abnormalities as well as issues with confusion felt to be due to UTI.  Confusion and agitation due to delirium were noted to be resolving.  Patient continued to be limited by pain as well as deficits in mobility and ADL tasks.  Family requested CIR as patient was showing improvement in activity tolerance.    Hospital Course: [facility] was admitted to rehab  06/27/2019 for inpatient therapies to consist of PT, ST and OT at least three hours five days a week. Past admission physiatrist, therapy team and rehab RN have worked together to provide customized collaborative inpatient rehab.  Sleep-wake disruption has been ongoing during the stay.  Agitation has resolved with improvement in mood.  Serial check of labs showed electrolyte abnormalities have resolved.  Hypokalemia was supplemented x2 2 doses on 8/26 with follow-up labs showing resolution.  She did develop prerenal azotemia and  fluids were offered frequently to help maintain adequate hydration status.  Cozaar was decreased to 50 mg daily due to low blood pressures.  Xanax was discontinued and Zyprexa has been used on PRN basis to help with nighttime confusion.  Follow-up CBC shows H&H is stable  Allopurinol was held on 8/29 due to worsening of renal status.  Diarrhea has improved with discontinuation of Senokot and addition of Florastor as well as Metamucil.  She did did develop malaise on zero 8/28 with UA suspicious for UTI.  She was treated with fosfomycin x1. Tachycardia has been monitored and heart rate has improved.  She was maintained on subcu Lovenox for DVT prophylaxis throughout his stay.  Left hip incisions are noted to be C/D/INR healing well without any signs or symptoms of infection.  Hydrocodone was decreased to 5 mg TID prn due to delirium and to help improve cognitive status.  Her pain control has improved and is currently being  managed with use of Tylenol as needed basis.  She is continent of bowel and bladder.  She has made gains during rehab stay and is currently as CGA to  [place] assist level.  She will continue to receive follow up HHPT and [place] [facility] after discharge after discharge.  Rehab course: During patient's stay in rehab weekly team conferences were held to monitor patient's progress, set goals and discuss barriers to discharge. At admission, patient required mod  assist with basic self-care task and with mobility.  She exhibited moderate cognitive impairments affecting orientation, short-term memory, problem-solving and awareness of deficits.  She  has had improvement in activity tolerance, balance, postural control as well as ability to compensate for deficits.  She requires supervision for bathing and toileting transfers.  She requires mod assist for lower body dressing and is at min assist overall.  She continues to be limited by poor endurance.  Family education has been completed with daughter regarding all aspects of safety and care   Disposition: Home  Diet: Regular  Special Instructions: 1.  Offer fluids/supplements throughout the day 2.  WBAT.  Needs supervision with mobility.   Allergies as of 07/08/2019      Reactions   Amoxicillin-pot Clavulanate Diarrhea   Has patient had a PCN reaction causing immediate rash, facial/tongue/throat swelling, SOB or lightheadedness with hypotension: Yes Has patient had a PCN reaction causing severe rash involving mucus membranes or skin necrosis: No Has patient had a PCN reaction that required hospitalization: No Has patient had a PCN reaction occurring within the last 10 years: No If all of the above answers are "NO", then may proceed with Cephalosporin use.   Keflex [cephalexin] Diarrhea   Septra [sulfamethoxazole-trimethoprim] Nausea Only      Medication List    STOP taking these medications   ALPRAZolam 0.5 MG tablet Commonly known as: XANAX   enoxaparin 30 MG/0.3ML injection Commonly known as: LOVENOX   PARoxetine 30 MG tablet Commonly known as: PAXIL   raloxifene 60 MG tablet Commonly known as: EVISTA   senna 8.6 MG Tabs tablet Commonly known as: SENOKOT   valACYclovir 1000 MG tablet Commonly known as: VALTREX     TAKE these medications   acetaminophen 325 MG tablet Commonly known as: TYLENOL Take 1-2 tablets (325-650 mg total) by mouth every 4 (four) hours as needed for mild  pain.   allopurinol 100 MG tablet Commonly known as: ZYLOPRIM Take 1 tablet (100 mg total) by mouth daily. What changed: how much to take   aspirin 81 MG tablet Take 81 mg by mouth daily.   calcium-vitamin D 500 MG tablet Take 1 tablet by mouth daily.   CRANBERRY PO Take 1 capsule by mouth 2 (two) times daily.   gabapentin 300 MG capsule Commonly known as: NEURONTIN Take 300 mg by mouth 2 (two) times daily.   HYDROcodone-acetaminophen 5-325 MG tablet Commonly known as: NORCO/VICODIN Take 1 tablet by mouth every 8 (eight) hours as needed for severe pain. What changed:   when to take this  reasons to take this   losartan 50 MG tablet Commonly known as: COZAAR Take 1 tablet (50 mg total) by mouth daily. What changed:   medication strength  how much to take   meclizine 25 MG tablet Commonly known as: ANTIVERT Take 1 tablet (25 mg total) by mouth 3 (three) times daily as needed for dizziness.   metoprolol tartrate 50 MG tablet Commonly known as: LOPRESSOR Take 1 tablet (50 mg  total) by mouth 2 (two) times daily.   multivitamin per tablet Take 1 tablet by mouth daily.   OLANZapine zydis 5 MG disintegrating tablet Commonly known as: ZYPREXA Take 1 tablet (5 mg total) by mouth 2 (two) times daily as needed (agitation).   PROBIOTIC DAILY PO Take 1 tablet by mouth daily.   psyllium 95 % Pack Commonly known as: HYDROCIL/METAMUCIL Take 1 packet by mouth daily.   vitamin C 100 MG tablet Take 100 mg by mouth daily.     ASK your doctor about these medications   glucosamine-chondroitin 500-400 MG tablet Take 1 tablet by mouth daily. Ask about: Which instructions should I use?      Follow-up Information    Lovorn, Jinny Blossom, MD Follow up.   Specialty: Physical Medicine and Rehabilitation Why: office will call you with follow up appointment Contact information: [id number] [address] [place] [address] [phone]        Suella Broad, MD. Call.    Specialty: Physical Medicine and Rehabilitation Why: for routine check.  Contact information: [address] [place] [address] [id number]        Abner Greenspan, MD. Call on 07/11/2019.   Specialties: Family Medicine, Radiology Why: for post hospital follow up.  Contact information: [place] [address] [phone]        Rod Can, MD. Call on 07/10/2019.   Specialty: Orthopedic Surgery Why: for post op check.  Contact information: [address] [place] [address] [id number]           Signed: Bary Leriche 07/09/2019, 10:28 PM

## 2019-07-07 NOTE — Progress Notes (Signed)
[facility] PHYSICAL MEDICINE & REHABILITATION PROGRESS NOTE   Subjective/Complaints:   Pt reports didn't sleep well- per nursing sleep chart, was up and down all night. Getting breakfast just now- asking nurse to cut up food again- appropriately.   ROS: Patient denies fever, rash, sore throat, blurred vision, nausea, vomiting,   cough, shortness of breath or chest pain,   or mood change.     Objective:   No results found. No results for input(s): WBC, HGB, HCT, PLT in the last 72 hours. Recent Labs    07/05/19 0531  NA 140  K 4.0  CL 101  CO2 28  GLUCOSE 100*  BUN 20  CREATININE 0.73  CALCIUM 8.5*    Intake/Output Summary (Last 24 hours) at 07/07/2019 0904 Last data filed at 07/06/2019 1328 Gross per 24 hour  Intake 480 ml  Output -  Net 480 ml     Physical Exam: Vital Signs Blood pressure (!) 121/59, pulse 99, temperature 98.6 F (37 C), resp. rate 18, height 5' (1.524 m), weight 52.7 kg, last menstrual period 11/02/1980, SpO2 97 %.  Physical Exam  Constitutional: No distress . Vital signs reviewed. Sitting up in bed, sleepy again, asking when Saturday is so can go home; looks great, NAD HEENT: EOMI, oral membranes moist Neck: supple Cardiovascular: mildly tachycardic; regular rhythm, frustrated about food   Respiratory: CTA Bilaterally  GI: BS +, non-tender, non-distended  Musculoskeletal:     Comments: yellow associated bruising on L hip Neurological:  Oriented to self and hospital, Ox2 HOH Motor: Bilateral upper extremities: 4/5 proximal distal Right lower extremity: 4-/5 hip flexion, knee extension, 4/5 ankle dorsiflexion Left lower extremity: 2/5 hip flexion, knee extension (ongoing pain inhibition), 3/5 ankle dorsiflexion  Skin: She is not diaphoretic.  Left hip incision almost completely healed incisions x2- minimal associated bruising-yellow and minimal edema  Psychiatric:fbrighter affect     Assessment/Plan: 1. Functional deficits secondary  to L  Hip fracture s/p IM nail WBAT  which require 3+ hours per day of interdisciplinary therapy in a comprehensive inpatient rehab setting.  Physiatrist is providing close team supervision and 24 hour management of active medical problems listed below.  Physiatrist and rehab team continue to assess barriers to discharge/monitor patient progress toward functional and medical goals  Care Tool:  Bathing  Bathing activity did not occur: Refused Body parts bathed by patient: Right arm, Left arm, Chest, Abdomen, Right upper leg, Left upper leg, Face   Body parts bathed by helper: Front perineal area, Buttocks, Right lower leg, Left lower leg     Bathing assist Assist Level: Moderate Assistance - Patient 50 - 74%     Upper Body Dressing/Undressing Upper body dressing   What is the patient wearing?: Button up shirt    Upper body assist Assist Level: Moderate Assistance - Patient 50 - 74%    Lower Body Dressing/Undressing Lower body dressing      What is the patient wearing?: Underwear/pull up, Pants     Lower body assist Assist for lower body dressing: Moderate Assistance - Patient 50 - 74%     Toileting Toileting    Toileting assist Assist for toileting: Maximal Assistance - Patient 25 - 49%     Transfers Chair/bed transfer  Transfers assist     Chair/bed transfer assist level: Contact Guard/Touching assist Chair/bed transfer assistive device: Programmer, multimedia   Ambulation assist      Assist level: Contact Guard/Touching assist Assistive device: Walker-rolling Max distance: 36'  Walk 10 feet activity   Assist  Walk 10 feet activity did not occur: Safety/medical concerns(decreased strenght/activity tolerance and pain)  Assist level: Contact Guard/Touching assist Assistive device: Walker-rolling   Walk 50 feet activity   Assist Walk 50 feet with 2 turns activity did not occur: Safety/medical concerns(decreased strenght/activity tolerance  and pain)  Assist level: Contact Guard/Touching assist Assistive device: Walker-rolling    Walk 150 feet activity   Assist Walk 150 feet activity did not occur: Safety/medical concerns(decreased strenght/activity tolerance and pain)         Walk 10 feet on uneven surface  activity   Assist Walk 10 feet on uneven surfaces activity did not occur: Safety/medical concerns(decreased strenght/activity tolerance and pain)         Wheelchair     Assist Will patient use wheelchair at discharge?: Yes Type of Wheelchair: Manual    Wheelchair assist level: Minimal Assistance - Patient > 75% Max wheelchair distance: 50    Wheelchair 50 feet with 2 turns activity    Assist    Wheelchair 50 feet with 2 turns activity did not occur: Safety/medical concerns(decreased strenght/activity tolerance)   Assist Level: Minimal Assistance - Patient > 75%   Wheelchair 150 feet activity     Assist  Wheelchair 150 feet activity did not occur: Safety/medical concerns(decreased strength/activity tolerance and pain)       Blood pressure (!) 121/59, pulse 99, temperature 98.6 F (37 C), resp. rate 18, height 5' (1.524 m), weight 52.7 kg, last menstrual period 11/02/1980, SpO2 97 %.  ASSESSMENT and PLAN:  1.  Deficits with mobility, self-care, delirium, agitation secondary to left hip fracture status post IM nail. WBAT on LLE             -Continue CIR therapies including PT, OT  2.  Antithrombotics: -DVT/anticoagulation:  Pharmaceutical: Lovenox             -antiplatelet therapy: ASA 3. Chronic LBP/Pain Management: Continue gabapentin bid with Norco prn             reduced norco to 5/325 given AMS   -limit hydrocodone use as possible  -kpad for left hip  9/1- Kpad was leaking- nursing fixed  9/4- less complaints of pain 4. Mood: LCSW to follow for evaluation and support.              -antipsychotic agents: Continue ativan prn for agitation/sleep  -limit given  confusion/age 85. Neuropsych: This patient is not capable of making decisions on her own behalf. 6. Skin/Wound Care: Monitor wound daily for healing.  7. Fluids/Electrolytes/Nutrition: Monitor I/Os. Offer fluids frequently as getting dehydrated. BUN 22--->32 on last check.  BMP ordered for tomorrow a.m.  8/27- BUN/Cr OK 8/26- labs again Fri. 8. HTN: Continue Metoprolol bid and Cozaar daily.  Monitor with increased mobility..    -8/30: reduce cozaar to 50mg  daily for soft BP's 9. Delirum: Question dementia worsened in hospital setting  -improved with reduction in hydrocodone  -rx'ed potential UTI as below  -still some HS confusion  - zyprexa prn at bedtime   -continue to monitor  8/31- still sleepy early in AM- working with therapy; con't meds  10. Diarrhea/loose stools: Has history of C diff/colon cancer.   -Discontinued Senokot.   -held allopurinol 8/29  -continue florastor and metamucil  -encourage adequate PO intake  -no signs of infectious diarrhea at present  -check labs tomorrow  8/31- BUN 18; Cr nml; K+ 4.4  9/2- labs nml- K+ 4.0; Bun 20; Cr  0.73 11. ABLA: Continue to monitor with serial checks.  CBC ordered for tomorrow a.m. 12. Thrombocytopenia: Monitor with serial checks as on Lovenox. Monitor for signs of bleeding.     -platelets 123 8/28  -recheck Monday. 13.  E coli UTI: Completed 5 days of rocephin --recheck urine culture for clearing due to ongoing delirium. Monitor WBC/temps for signs of infection.   14. Hypokalemia  8/26- will replete her K+ from 3.1 today- will give KCl x 2 doses 40 mEq and recheck BMP.   8/28- K+ improved  -labs tomorrow 8/31  8/31- K+ 4.4  9/2- K+ 4.0 15. Hypocalcemia  8/26- Ca 7.8--replete 16. Possible UTI- UA suspicious  -ucs still pending from 8/28  -Fosfomycin  X 1 on 8/28 since allergic to Bactrim and PCN.  -afebrile  8/31- U Cx grew nothing- <10k   17. Tachycardia  9/2- has been going up gradually over last 2-3 days- was in 90s- up  to 112 in last 24 hours- on Lovenox, so unlikely has DVT/PE- sats 98+% on RA- will monitor closely. NOT DRY.  9/3- Pulse back down to 94 this AM.  9/4- Pulse 99- stable- sats 98%  18. Dispo- d/c tomorrow- Saturday 9/5 LOS: 10 days A FACE TO FACE EVALUATION WAS PERFORMED  Treyshawn Muldrew 07/07/2019, 9:04 AM

## 2019-07-07 NOTE — Discharge Instructions (Signed)
Inpatient Rehab Discharge Instructions  Michelle Gregory Discharge date and time: 07/08/19    Activities/Precautions/ Functional Status: Activity: activity as tolerated Diet: regular diet Wound Care: keep wound clean and dry   Functional status:  ___ No restrictions     ___ Walk up steps independently _X__ 24/7 supervision/assistance   ___ Walk up steps with assistance ___ Intermittent supervision/assistance  ___ Bathe/dress independently ___ Walk with walker     _X__ Bathe/dress with assistance ___ Walk Independently    ___ Shower independently _X__ Walk with assistance    ___ Shower with assistance ___ No alcohol     ___ Return to work/school ________  Special Instructions: 1. Offer fluids /supplements thorough out the day.   COMMUNITY REFERRALS UPON DISCHARGE:    Home Health:   PT & OT  Agency:[facility]   [place]   Date of last service:07/08/2019  Medical Equipment/Items Ordered:HOSPITAL BED AND 3 IN 1  Agency/Supplier:[facility]   [phone]     My questions have been answered and I understand these instructions. I will adhere to these goals and the provided educational materials after my discharge from the hospital.  Patient/Caregiver Signature _______________________________ Date __________  Clinician Signature _______________________________________ Date __________  Please bring this form and your medication list with you to all your follow-up doctor's appointments.

## 2019-07-07 NOTE — Progress Notes (Signed)
Speech Language Pathology Discharge Summary  Patient Details  Name: Michelle Gregory MRN: [id number] Date of Birth: 12-07-1922  Today's Date: 07/07/2019 SLP Individual Time: 0930-1028 SLP Individual Time Calculation (min): 58 min   Skilled Therapeutic Interventions:  Skilled treatment session focused on cognition. SLP facilitated session by providing basic problem solving task of folding wash clothes and towels. While pt able to perform task, she was repetitive in providing historical stories. She frequently stated the same information word for word with no recall that she had just previously stated information. Pt left upright in her wheelchair, lap belt alarm on and all needs within reach. Continue per current plan of care.      Patient has met 5 of 5 long term goals.  Patient to discharge at overall Min;Mod level.  Reasons goals not met:   N/A  Clinical Impression/Discharge Summary:   Pt has made some progress in decreasing her level of confusion and delirium during skilled ST sessions. Pt's level of required support fluctuates given baseline dementia and delirium. All education has been completed with pt and her daughter. No further skilled ST is indicated at this time.   Care Partner:  Caregiver Able to Provide Assistance: Yes  Type of Caregiver Assistance: Physical;Cognitive  Recommendation:  24 hour supervision/assistance      Equipment:   N/A  Reasons for discharge: Treatment goals met   Patient/Family Agrees with Progress Made and Goals Achieved: Yes    Tanner Vigna 07/07/2019, 10:49 AM

## 2019-07-07 NOTE — Progress Notes (Signed)
Occupational Therapy Session Note  Patient Details  Name: Michelle Gregory MRN: [id number] Date of Birth: [birth year]  Today's Date: 07/07/2019 OT Individual Time: 3779-3968 OT Individual Time Calculation (min): 9 min  and Today's Date: 07/07/2019 OT Missed Time: 21 Minutes Missed Time Reason: Patient fatigue  \Short Term Goals: Week 2:  OT Short Term Goal 1 (Week 2): Pt will perform sit to stands with min cues for proper hand placement in prep for ADL OT Short Term Goal 2 (Week 2): Pt will perform toileting tasks with mod A OT Short Term Goal 3 (Week 2): Pt will don LB clothing with min A with AE prn OT Short Term Goal 4 (Week 2): Pt will complete bathing tasks with min A sit<>stand at sink of shower  Skilled Therapeutic Interventions/Progress Updates:    Pt greeted semi-reclined in bed with daughter present. Daughter requests therapist not get pt OOB 2/2 already having 2 therapies this morning and pt needing to rest. OT discussed dc plan, OT goals, and pt and daughter rediness to go home and do everyday activities. Pt and daughter both feel confident in dc home tomorrow and are very pleased with pt's care and progress. Pt left semi-reclined in bed with daughter present, bed alarm on, and needs met.   Therapy Documentation Precautions:  Precautions Precautions: Fall Restrictions Weight Bearing Restrictions: Yes LLE Weight Bearing: Weight bearing as tolerated General: General OT Amount of Missed Time: 21 Minutes Pain: Pain Assessment Pain Scale: 0-10 Pain Score: 4 Pain Type: Acute pain;Surgical pain Pain Location: Leg Pain Orientation: Left Pain Descriptors / Indicators: Aching Pain Onset: On-going Pain Intervention(s): Rest;Relaxation;   Therapy/Group: Individual Therapy  Valma Cava 07/07/2019, 12:05 PM

## 2019-07-07 NOTE — Telephone Encounter (Signed)
Patient will be discharged tomorrow from [facility].  Patient broke her hip.  Patient's daughter called and said they changed patient's medication while she was in the hospital and patient's daughter wants to be sure Dr.Tower is okay with the changes in medication.

## 2019-07-07 NOTE — Telephone Encounter (Signed)
Returned patient's daughter, [name]'s call and gave her Dr. Marliss Coots comments.   She states that her mother needs a hospital f/u with Dr. Glori Bickers and I have offered virtual follow up given the hip fracture situation and difficulty with bringing mother in the office.  Virtual f/u has been set up for 07/14/19 at 2pm via [email or web address] mobile access.

## 2019-07-08 NOTE — Progress Notes (Signed)
Patient is discharging from unit today. Daughter and patient was given discharge instructions by Algis Liming PA-C. No questions/ concerns noted, patient belongings sent home with daughter and patient.

## 2019-07-09 DIAGNOSIS — D649 Anemia, unspecified: Secondary | ICD-10-CM

## 2019-07-11 ENCOUNTER — Ambulatory Visit (INDEPENDENT_AMBULATORY_CARE_PROVIDER_SITE_OTHER): Payer: MEDICARE | Admitting: Family Medicine

## 2019-07-11 ENCOUNTER — Encounter: Payer: Self-pay | Admitting: Family Medicine

## 2019-07-11 ENCOUNTER — Telehealth: Payer: Self-pay

## 2019-07-11 ENCOUNTER — Other Ambulatory Visit: Payer: Self-pay

## 2019-07-11 VITALS — BP 94/62 | HR 72 | Temp 98.7°F | Ht 60.5 in

## 2019-07-11 DIAGNOSIS — S72142S Displaced intertrochanteric fracture of left femur, sequela: Secondary | ICD-10-CM | POA: Diagnosis not present

## 2019-07-11 DIAGNOSIS — F0390 Unspecified dementia without behavioral disturbance: Secondary | ICD-10-CM | POA: Diagnosis not present

## 2019-07-11 DIAGNOSIS — I10 Essential (primary) hypertension: Secondary | ICD-10-CM | POA: Diagnosis not present

## 2019-07-11 DIAGNOSIS — N390 Urinary tract infection, site not specified: Secondary | ICD-10-CM

## 2019-07-11 DIAGNOSIS — R4182 Altered mental status, unspecified: Secondary | ICD-10-CM | POA: Diagnosis not present

## 2019-07-11 DIAGNOSIS — R410 Disorientation, unspecified: Secondary | ICD-10-CM | POA: Diagnosis not present

## 2019-07-11 DIAGNOSIS — W19XXXA Unspecified fall, initial encounter: Secondary | ICD-10-CM | POA: Diagnosis not present

## 2019-07-11 DIAGNOSIS — Z23 Encounter for immunization: Secondary | ICD-10-CM | POA: Diagnosis not present

## 2019-07-11 DIAGNOSIS — M810 Age-related osteoporosis without current pathological fracture: Secondary | ICD-10-CM

## 2019-07-11 DIAGNOSIS — G3109 Other frontotemporal dementia: Secondary | ICD-10-CM

## 2019-07-11 DIAGNOSIS — F0281 Dementia in other diseases classified elsewhere with behavioral disturbance: Secondary | ICD-10-CM

## 2019-07-11 DIAGNOSIS — D62 Acute posthemorrhagic anemia: Secondary | ICD-10-CM

## 2019-07-11 DIAGNOSIS — Y92009 Unspecified place in unspecified non-institutional (private) residence as the place of occurrence of the external cause: Secondary | ICD-10-CM

## 2019-07-11 LAB — POC URINALSYSI DIPSTICK (AUTOMATED)
Bilirubin, UA: NEGATIVE
Blood, UA: NEGATIVE
Glucose, UA: NEGATIVE
Ketones, UA: NEGATIVE
Nitrite, UA: NEGATIVE
Protein, UA: NEGATIVE
Spec Grav, UA: 1.01 (ref 1.010–1.025)
Urobilinogen, UA: 0.2 E.U./dL
pH, UA: 6 (ref 5.0–8.0)

## 2019-07-11 MED ORDER — OLANZAPINE 5 MG PO TBDP: 10.0000 mg | ORAL_TABLET | Freq: Every day | ORAL | 0 refills | Status: DC

## 2019-07-11 NOTE — Progress Notes (Signed)
Subjective:    Patient ID: Michelle Gregory, female    DOB: 10-23-1923, 85 y.o.   MRN: [id number]  HPI Here for f/u after closed L hip fx with surgery  Has dementia and struggled with hallucinations/agitation and not sleeping   Wt Readings from Last 3 Encounters:  07/05/19 116 lb 2.9 oz (52.7 kg)  06/22/19 120 lb (54.4 kg)  01/23/19 112 lb 6 oz (51 kg)   22.32 kg/m   bp is stable today  No cp or palpitations or headaches or edema  No side effects to medicines  BP Readings from Last 3 Encounters:  07/11/19 94/62  07/08/19 115/66  06/27/19 140/88    Taking losartan 50 mg  Metoprolol 50 mg   Pulse Readings from Last 3 Encounters:  07/11/19 72  07/08/19 95  06/27/19 94    ua- trace leukocytes only today Results for orders placed or performed in visit on 07/11/19  POCT Urinalysis Dipstick (Automated)  Result Value Ref Range   Color, UA Yellow    Clarity, UA Clear    Glucose, UA Negative Negative   Bilirubin, UA Negative    Ketones, UA Negative    Spec Grav, UA 1.010 1.010 - 1.025   Blood, UA Negative    pH, UA 6.0 5.0 - 8.0   Protein, UA Negative Negative   Urobilinogen, UA 0.2 0.2 or 1.0 E.U./dL   Nitrite, UA Negative    Leukocytes, UA Trace (A) Negative   h/o recurrent uti  Urine culture 8/28 insignificant growth  Not drinking much- family will work on that   Lab Results  Component Value Date   CREATININE 0.73 07/05/2019   BUN 20 07/05/2019   NA 140 07/05/2019   K 4.0 07/05/2019   CL 101 07/05/2019   CO2 28 07/05/2019   Lab Results  Component Value Date   WBC 6.0 07/03/2019   HGB 9.4 (L) 07/03/2019   HCT 30.2 (L) 07/03/2019   MCV 105.6 (H) 07/03/2019   PLT 186 07/03/2019    She is currently taking zyprexa 5 mg bid prn  Lies in bed all night and talks to people who are not there  Not sleeping well    Pain control is doing well  Did well in the rehab facility -since she got home things are not good (came home Saturday)  Really worn out  In a  hospital bed   PT is going to start tomorrow   SIL is helping care for her  He is very experienced and can transfer her when needed /get bathed/etc   Hydrocodone tid - has not started to wean   Taking allopurinol once daily  No gout issues  Diarrhea is better   Patient Active Problem List   Diagnosis Date Noted  . Dementia with behavioral disturbance ([place])   . Acute blood loss as cause of postoperative anemia 06/27/2019  . Pelvic fracture ([place]) 06/27/2019  . Closed displaced intertrochanteric fracture of left femur ([place]) 06/21/2019  . Anxiety with depression 06/21/2019  . Diarrhea 03/24/2019  . Shingles 01/23/2019  . Fracture, ulna, distal 12/06/2018  . Thrombocytopenia ([place]) 12/06/2018  . Dementia, senile ([place]) 11/22/2018  . Hand injury, left, initial encounter 09/01/2018  . H/O falling 05/03/2018  . Episodic confusion 05/03/2018  . Fall at home 03/29/2018  . Anxiety disorder 03/29/2018  . Fatigue 03/29/2018  . Chronic pain 02/09/2018  . Cerumen impaction 12/06/2017  . H/O Clostridium difficile infection 10/13/2017  . Recurrent UTI (urinary tract infection)  09/06/2017  . Auditory hallucinations 03/03/2017  . Female cystocele 04/13/2016  . Bowel habit changes 12/30/2015  . Compression fracture 12/30/2015  . Routine general medical examination at a health care facility 04/09/2015  . [place] intertrigo 03/28/2014  . Encounter for Medicare annual wellness exam 03/21/2013  . Gout 09/19/2012  . Age related osteoporosis 05/05/2011  . Degenerative lumbar disc 01/20/2011  . VARICOSE VEINS, LOWER EXTREMITIES 09/04/2010  . Prediabetes 01/08/2010  . Hyperlipidemia 05/09/2008  . Essential hypertension 05/09/2008  . HEMORRHOIDS, INTERNAL 05/09/2008  . Allergic rhinitis 05/09/2008  . Mild reactive airways disease 05/09/2008  . GERD 05/09/2008  . IBS 05/09/2008  . Osteoarthritis 05/09/2008  . History of malignant neoplasm of large intestine 05/09/2008   Past Medical  History:  Diagnosis Date  . Allergic rhinitis   . Anxiety   . C. difficile diarrhea 10/2017  . Cancer ([place])   . Depression   . GERD (gastroesophageal reflux disease)   . History of colon cancer   . History of recurrent UTIs   . Hyperlipidemia   . Hypertension   . Osteoarthritis   . Spinal compression fracture ([place])   . Vertigo    Past Surgical History:  Procedure Laterality Date  . APPENDECTOMY    . CATARACT EXTRACTION    . COLON RESECTION    . INTRAMEDULLARY (IM) NAIL INTERTROCHANTERIC Left 06/22/2019   Procedure: INTRAMEDULLARY (IM) NAIL INTERTROCHANTRIC;  Surgeon: Rod Can, MD;  Location: [facility];  Service: Orthopedics;  Laterality: Left;  . TONSILLECTOMY     Social History   Tobacco Use  . Smoking status: Never Smoker  . Smokeless tobacco: Never Used  Substance Use Topics  . Alcohol use: No    Alcohol/week: 0.0 standard drinks  . Drug use: No   Family History  Problem Relation Age of Onset  . Prostate cancer Father   . Heart attack Mother   . Gout Mother    Allergies  Allergen Reactions  . Amoxicillin-Pot Clavulanate Diarrhea    Has patient had a PCN reaction causing immediate rash, facial/tongue/throat swelling, SOB or lightheadedness with hypotension: Yes Has patient had a PCN reaction causing severe rash involving mucus membranes or skin necrosis: No Has patient had a PCN reaction that required hospitalization: No Has patient had a PCN reaction occurring within the last 10 years: No If all of the above answers are "NO", then may proceed with Cephalosporin use.   Marland Kitchen Keflex [Cephalexin] Diarrhea  . Septra [Sulfamethoxazole-Trimethoprim] Nausea Only   Current Outpatient Medications on File Prior to Visit  Medication Sig Dispense Refill  . acetaminophen (TYLENOL) 325 MG tablet Take 1-2 tablets (325-650 mg total) by mouth every 4 (four) hours as needed for mild pain.    Marland Kitchen allopurinol (ZYLOPRIM) 100 MG tablet Take 1 tablet (100 mg total) by mouth daily.  180 tablet 1  . Ascorbic Acid (VITAMIN C) 100 MG tablet Take 100 mg by mouth daily.    Marland Kitchen aspirin 81 MG tablet Take 81 mg by mouth daily.      . calcium-vitamin D (CALCIUM 500 +D) 500 MG tablet Take 1 tablet by mouth daily.     Marland Kitchen CRANBERRY PO Take 1 capsule by mouth 2 (two) times daily.    Marland Kitchen gabapentin (NEURONTIN) 300 MG capsule Take 300 mg by mouth 2 (two) times daily.   6  . HYDROcodone-acetaminophen (NORCO/VICODIN) 5-325 MG tablet Take 1 tablet by mouth every 8 (eight) hours as needed for severe pain. 30 tablet 0  . metoprolol tartrate (  LOPRESSOR) 50 MG tablet Take 1 tablet (50 mg total) by mouth 2 (two) times daily. 180 tablet 1  . multivitamin (THERAGRAN) per tablet Take 1 tablet by mouth daily.      . Probiotic Product (PROBIOTIC DAILY PO) Take 1 tablet by mouth daily.    . psyllium (HYDROCIL/METAMUCIL) 95 % PACK Take 1 packet by mouth daily. 240 each 0  . [DISCONTINUED] Glucosamine-Chondroitin 1500-1200 MG/30ML LIQD Take by mouth. As dierected      No current facility-administered medications on file prior to visit.     Review of Systems  Constitutional: Negative for activity change, appetite change, fatigue, fever and unexpected weight change.  HENT: Negative for congestion, ear pain, rhinorrhea, sinus pressure and sore throat.   Eyes: Negative for pain, redness and visual disturbance.  Respiratory: Negative for cough, shortness of breath and wheezing.   Cardiovascular: Negative for chest pain and palpitations.  Gastrointestinal: Negative for abdominal pain, blood in stool, constipation and diarrhea.  Endocrine: Negative for polydipsia and polyuria.  Genitourinary: Negative for dysuria, frequency and urgency.  Musculoskeletal: Positive for arthralgias and back pain. Negative for myalgias.  Skin: Negative for pallor and rash.  Allergic/Immunologic: Negative for environmental allergies.  Neurological: Negative for dizziness, syncope, facial asymmetry and headaches.       Poor balance   Dementia   Hematological: Negative for adenopathy. Does not bruise/bleed easily.  Psychiatric/Behavioral: Positive for agitation, hallucinations and sleep disturbance. Negative for decreased concentration, dysphoric mood and self-injury. The patient is not nervous/anxious.        Objective:   Physical Exam Constitutional:      General: She is not in acute distress.    Appearance: Normal appearance. She is well-developed and normal weight. She is not ill-appearing.     Comments: Frail appearing elderly female in wheelchair   HENT:     Head: Normocephalic and atraumatic.     Mouth/Throat:     Mouth: Mucous membranes are moist.     Pharynx: Oropharynx is clear.  Eyes:     Conjunctiva/sclera: Conjunctivae normal.     Pupils: Pupils are equal, round, and reactive to light.  Neck:     Musculoskeletal: Normal range of motion and neck supple. No neck rigidity or muscular tenderness.     Thyroid: No thyromegaly.     Vascular: No carotid bruit or JVD.  Cardiovascular:     Rate and Rhythm: Normal rate and regular rhythm.     Pulses: Normal pulses.     Heart sounds: Normal heart sounds. No gallop.   Pulmonary:     Effort: Pulmonary effort is normal. No respiratory distress.     Breath sounds: Normal breath sounds. No wheezing or rales.     Comments: Good air exch Abdominal:     General: Bowel sounds are normal. There is no distension or abdominal bruit.     Palpations: Abdomen is soft. There is no mass.     Tenderness: There is no abdominal tenderness.  Musculoskeletal:     Right lower leg: No edema.     Left lower leg: No edema.     Comments: Healing incision - L upper leg/hip  Lymphadenopathy:     Cervical: No cervical adenopathy.  Skin:    General: Skin is warm and dry.     Findings: No rash.  Neurological:     Mental Status: She is alert. Mental status is at baseline. She is disoriented.     Cranial Nerves: No cranial nerve deficit.  Motor: Weakness present.     Gait:  Gait abnormal.     Deep Tendon Reflexes: Reflexes are normal and symmetric.     Comments: Generalized weakness/ not focal Cannot stand w/o assist of 2 people  Mild hand tremor    Psychiatric:        Attention and Perception: She is inattentive.        Thought Content: Thought content is delusional.        Cognition and Memory: Cognition is impaired. Memory is impaired.     Comments: Talks- sometimes to herself and sometimes to me and family  Not anxious but generally fatigued Picks at her clothing  Not agitated or withdrawn            Assessment & Plan:   Problem List Items Addressed This Visit      Cardiovascular and Mediastinum   Essential hypertension    BP: 94/62  Will hold her losartan  Continue metoprolol with obs  Can cut dose if needed         Nervous and Auditory   Dementia, senile ([place])    S/p hosp and surgery for hip fx/ then rehab stay  Reviewed hospital records, lab results and studies in detail   With each change of location-having more confusion and hallucinations  Several bad nights at home-(only home since Saturday)  Disc pros/cons/risks of zyprexa (including inc in mortality risk)  Will try 10 mg at bedtime (currently on 5) and d/c the day time dose  If not effective-consider mirtazapine for sleep/agitation  Appetite is good  Family does not want to start aricept or other dementia drug at her late stage       Relevant Medications   OLANZapine zydis (ZYPREXA) 5 MG disintegrating tablet   Dementia with behavioral disturbance ([place]) - Primary   Relevant Medications   OLANZapine zydis (ZYPREXA) 5 MG disintegrating tablet   RESOLVED: Delirium    Reviewed hospital records, lab results and studies in detail  Improved at rehab center Dementia with hallucinations continue         Musculoskeletal and Integument   Age related osteoporosis    Continues to hold evista until mobility improves s/p fx hip  Disc clot risk if sedentary      Closed  displaced intertrochanteric fracture of left femur [facility])    Reviewed hospital records, lab results and studies in detail  Pt did well with surgery and is now home from rehab Some anemia as expected Has ortho f/u upcoming In light of MS changes-urged family to begin weaning the hydrocodone and replacing it with low dose tylenol (she is not c/o pain)         Genitourinary   Recurrent UTI (urinary tract infection)    UA is fairly clear as was last urine culture  Enc more fluids -family will see to that         Other   Fall at home    With hip fx Now has atc care Also PT/OT starting visits  Using walker at all times        Acute blood loss as cause of postoperative anemia    Hb of 9.4 Will monitor       Other Visit Diagnoses    Altered mental status, unspecified altered mental status type       Relevant Orders   POCT Urinalysis Dipstick (Automated) (Completed)   Need for influenza vaccination       Relevant Orders   Flu  Vaccine QUAD 6+ mos PF IM (Fluarix Quad PF) (Completed)

## 2019-07-11 NOTE — Telephone Encounter (Signed)
Michelle Gregory (DPR signed) said pt came home from hospital with fx on 07/08/19. Pt has hallucinations on and off due to dementia but since 07/08/19 pt has not slept; pt is talking all the time to people who are not there. Yesterday noticed ankles are swelling. On discharge paxil and evista was stopped and losartan and allopurinol was changed. Michelle Gregory wants pt seen in office. Pt has no covid symptoms, no travel and no known exposure to + covid. Pt has appt today at 4:15 with Dr Glori Bickers. ED precautions given and Michelle Gregory voiced understanding. Michelle Gregory is going to try to bring urine specimen.

## 2019-07-11 NOTE — Patient Instructions (Addendum)
Stop the losartan  If blood pressure stays low let me know   Give the zyprexa (olanzaprine)- 10 mg at night  Watch for sedation/falls If problems or not helpful let me know   Encourage fluids   If this does work out- we may consider mirtazapine    Start to slowly wean the hydrocodone  Cut to twice daily and then once daily as tolerated-watching her pain control status   Update me / call late this week to let me know how things are going   Follow up with orthopedics as planned

## 2019-07-11 NOTE — Assessment & Plan Note (Signed)
Hb of 9.4 Will monitor

## 2019-07-11 NOTE — Assessment & Plan Note (Signed)
Reviewed hospital records, lab results and studies in detail  Pt did well with surgery and is now home from rehab Some anemia as expected Has ortho f/u upcoming In light of MS changes-urged family to begin weaning the hydrocodone and replacing it with low dose tylenol (she is not c/o pain)

## 2019-07-11 NOTE — Assessment & Plan Note (Signed)
UA is fairly clear as was last urine culture  Enc more fluids -family will see to that

## 2019-07-11 NOTE — Assessment & Plan Note (Signed)
BP: 94/62  Will hold her losartan  Continue metoprolol with obs  Can cut dose if needed

## 2019-07-11 NOTE — Assessment & Plan Note (Signed)
Continues to hold evista until mobility improves s/p fx hip  Disc clot risk if sedentary

## 2019-07-11 NOTE — Telephone Encounter (Signed)
Good I will see her then

## 2019-07-11 NOTE — Assessment & Plan Note (Signed)
With hip fx Now has atc care Also PT/OT starting visits  Using walker at all times

## 2019-07-11 NOTE — Assessment & Plan Note (Signed)
S/p hosp and surgery for hip fx/ then rehab stay  Reviewed hospital records, lab results and studies in detail   With each change of location-having more confusion and hallucinations  Several bad nights at home-(only home since Saturday)  Disc pros/cons/risks of zyprexa (including inc in mortality risk)  Will try 10 mg at bedtime (currently on 5) and d/c the day time dose  If not effective-consider mirtazapine for sleep/agitation  Appetite is good  Family does not want to start aricept or other dementia drug at her late stage

## 2019-07-11 NOTE — Assessment & Plan Note (Signed)
Reviewed hospital records, lab results and studies in detail  Improved at rehab center Dementia with hallucinations continue

## 2019-07-12 ENCOUNTER — Telehealth: Payer: Self-pay

## 2019-07-12 MED ORDER — OLANZAPINE 5 MG PO TBDP: 5.0000 mg | ORAL_TABLET | Freq: Every day | ORAL | 0 refills | Status: DC

## 2019-07-12 NOTE — Telephone Encounter (Addendum)
[name] said last night she gave pt olanzapine 10 mg; pt did rest better with less hallucinating but this morning [name] also gave pt hydrocodone apap 5-325 and gabapentin this morning and having difficult time waking pt up. [name] just got pt aroused long enough to drink 2 - 3 swallows and then pt went back to sleep. Pt speech is very slurred but no facial drooping or one sided weakness. Now BP 111/61 and P 80. Dr Glori Bickers said may be too much of med. Go back tonight to Olanzapine 5 mg at hs. Encourage fluids and cb if pt condition worsens or BP goes lower. ED precautions given and [name] voiced understanding.updated med list for Olanzapine. See 07/07/19 phone note that courtney left also.

## 2019-07-12 NOTE — Telephone Encounter (Signed)
Watch her through the day to let the medicine get out of her system and let us know later if still no improvement   Tell me tomorrow how night goes tonight and we will make a plan from there

## 2019-07-12 NOTE — Addendum Note (Signed)
Addended by: Helene Shoe on: 07/12/2019 12:20 PM   Modules accepted: Orders

## 2019-07-12 NOTE — Telephone Encounter (Signed)
[name]( daughter ) stated that the patient seems to be over sedated.  She stated that she can not get her mom to wake up, but she stated she is breathing. While she was on the phone she stated that the patient is in the chair grabbing at things in the air and there is nothing there.,   [name] called requesting to speak to you since she spoke with you yesterday about patient  She is not sure what she should do.   C/B # [phone]

## 2019-07-13 MED ORDER — MIRTAZAPINE 15 MG PO TABS: 15.0000 mg | ORAL_TABLET | Freq: Every day | ORAL | 11 refills | Status: AC

## 2019-07-13 NOTE — Telephone Encounter (Signed)
Daughter [name] notified of Dr. Marliss Coots recommendations. [name] said that since the 5mg  of zyprexa wasn't enough and the 10mg  was to much she wants to just try a new med to see if it works better for her. Daughter wants to try the mirtazapine. [facility] is pharmacy

## 2019-07-13 NOTE — Telephone Encounter (Signed)
Michelle Gregory notified of Dr. Marliss Coots comments and verbalized understanding. Michelle Gregory will keep [place] posted

## 2019-07-13 NOTE — Telephone Encounter (Signed)
Options include  Trying nothing for a few days,  Trying 5 of zyprexa again prn or (we discussed) a trial of mirtazapine (a drug that helps sleep and appetite) at bedtime  I am open to any of these  Let me know what they think So glad she is doing better

## 2019-07-13 NOTE — Telephone Encounter (Addendum)
Spoke with [name] and she said pt slept all day yesterday they couldn't get her up to eat or drink or anything, pt slept all night also. Pt's son-n-law checked vitals and every thing was okay so they just let her sleep yesterday. Pt didn't give her any zyprexa yesterday due to being very sedated. Pt woke up today and is back to normal. Pt isn't confused anymore she woke up and ate breakfast and is back to her baseline. Pt's daughter did want to know what Dr. Glori Bickers thought about the zyprexa dose. Daughter said it seemed like 1 tab didn't really help but 2 tabs it to much for pt. Daughter wanted me to see if Dr. Glori Bickers wanted her to go back to only taking 1 tab and just give it time or maybe just give it to her PRN daughter said whatever Dr. Glori Bickers recommends they will do

## 2019-07-13 NOTE — Telephone Encounter (Signed)
I sent it  Let me know how it goes It may take a week or so to know  Long term -it tends to help mood more   Keep me posted

## 2019-07-14 ENCOUNTER — Ambulatory Visit: Payer: MEDICARE | Admitting: Family Medicine

## 2019-07-14 DIAGNOSIS — Z9181 History of falling: Secondary | ICD-10-CM | POA: Diagnosis not present

## 2019-07-14 DIAGNOSIS — S52501D Unspecified fracture of the lower end of right radius, subsequent encounter for closed fracture with routine healing: Secondary | ICD-10-CM | POA: Diagnosis not present

## 2019-07-14 DIAGNOSIS — S72142D Displaced intertrochanteric fracture of left femur, subsequent encounter for closed fracture with routine healing: Secondary | ICD-10-CM | POA: Diagnosis not present

## 2019-07-14 DIAGNOSIS — M545 Low back pain: Secondary | ICD-10-CM | POA: Diagnosis not present

## 2019-07-14 DIAGNOSIS — Z79891 Long term (current) use of opiate analgesic: Secondary | ICD-10-CM | POA: Diagnosis not present

## 2019-07-14 DIAGNOSIS — M109 Gout, unspecified: Secondary | ICD-10-CM | POA: Diagnosis not present

## 2019-07-14 DIAGNOSIS — E876 Hypokalemia: Secondary | ICD-10-CM | POA: Diagnosis not present

## 2019-07-14 DIAGNOSIS — D649 Anemia, unspecified: Secondary | ICD-10-CM | POA: Diagnosis not present

## 2019-07-14 DIAGNOSIS — F0391 Unspecified dementia with behavioral disturbance: Secondary | ICD-10-CM | POA: Diagnosis not present

## 2019-07-14 DIAGNOSIS — M1990 Unspecified osteoarthritis, unspecified site: Secondary | ICD-10-CM | POA: Diagnosis not present

## 2019-07-14 DIAGNOSIS — Z7982 Long term (current) use of aspirin: Secondary | ICD-10-CM | POA: Diagnosis not present

## 2019-07-14 DIAGNOSIS — S329XXD Fracture of unspecified parts of lumbosacral spine and pelvis, subsequent encounter for fracture with routine healing: Secondary | ICD-10-CM | POA: Diagnosis not present

## 2019-07-14 DIAGNOSIS — I10 Essential (primary) hypertension: Secondary | ICD-10-CM | POA: Diagnosis not present

## 2019-07-14 DIAGNOSIS — Z85038 Personal history of other malignant neoplasm of large intestine: Secondary | ICD-10-CM | POA: Diagnosis not present

## 2019-07-14 DIAGNOSIS — G8929 Other chronic pain: Secondary | ICD-10-CM | POA: Diagnosis not present

## 2019-07-14 DIAGNOSIS — Z87440 Personal history of urinary (tract) infections: Secondary | ICD-10-CM | POA: Diagnosis not present

## 2019-07-14 DIAGNOSIS — F419 Anxiety disorder, unspecified: Secondary | ICD-10-CM | POA: Diagnosis not present

## 2019-07-17 DIAGNOSIS — G8929 Other chronic pain: Secondary | ICD-10-CM | POA: Diagnosis not present

## 2019-07-17 DIAGNOSIS — F419 Anxiety disorder, unspecified: Secondary | ICD-10-CM | POA: Diagnosis not present

## 2019-07-17 DIAGNOSIS — I10 Essential (primary) hypertension: Secondary | ICD-10-CM | POA: Diagnosis not present

## 2019-07-17 DIAGNOSIS — S72142D Displaced intertrochanteric fracture of left femur, subsequent encounter for closed fracture with routine healing: Secondary | ICD-10-CM | POA: Diagnosis not present

## 2019-07-17 DIAGNOSIS — M1990 Unspecified osteoarthritis, unspecified site: Secondary | ICD-10-CM | POA: Diagnosis not present

## 2019-07-17 DIAGNOSIS — D649 Anemia, unspecified: Secondary | ICD-10-CM | POA: Diagnosis not present

## 2019-07-19 DIAGNOSIS — G8929 Other chronic pain: Secondary | ICD-10-CM | POA: Diagnosis not present

## 2019-07-19 DIAGNOSIS — S72142D Displaced intertrochanteric fracture of left femur, subsequent encounter for closed fracture with routine healing: Secondary | ICD-10-CM | POA: Diagnosis not present

## 2019-07-19 DIAGNOSIS — D649 Anemia, unspecified: Secondary | ICD-10-CM | POA: Diagnosis not present

## 2019-07-19 DIAGNOSIS — M1990 Unspecified osteoarthritis, unspecified site: Secondary | ICD-10-CM | POA: Diagnosis not present

## 2019-07-19 DIAGNOSIS — I10 Essential (primary) hypertension: Secondary | ICD-10-CM | POA: Diagnosis not present

## 2019-07-19 DIAGNOSIS — F419 Anxiety disorder, unspecified: Secondary | ICD-10-CM | POA: Diagnosis not present

## 2019-07-20 ENCOUNTER — Encounter: Payer: MEDICARE | Admitting: Physical Medicine and Rehabilitation

## 2019-07-21 DIAGNOSIS — M1990 Unspecified osteoarthritis, unspecified site: Secondary | ICD-10-CM | POA: Diagnosis not present

## 2019-07-21 DIAGNOSIS — D649 Anemia, unspecified: Secondary | ICD-10-CM | POA: Diagnosis not present

## 2019-07-21 DIAGNOSIS — I10 Essential (primary) hypertension: Secondary | ICD-10-CM | POA: Diagnosis not present

## 2019-07-21 DIAGNOSIS — S72142D Displaced intertrochanteric fracture of left femur, subsequent encounter for closed fracture with routine healing: Secondary | ICD-10-CM | POA: Diagnosis not present

## 2019-07-21 DIAGNOSIS — G8929 Other chronic pain: Secondary | ICD-10-CM | POA: Diagnosis not present

## 2019-07-21 DIAGNOSIS — F419 Anxiety disorder, unspecified: Secondary | ICD-10-CM | POA: Diagnosis not present

## 2019-07-25 DIAGNOSIS — F419 Anxiety disorder, unspecified: Secondary | ICD-10-CM | POA: Diagnosis not present

## 2019-07-25 DIAGNOSIS — D649 Anemia, unspecified: Secondary | ICD-10-CM | POA: Diagnosis not present

## 2019-07-25 DIAGNOSIS — I10 Essential (primary) hypertension: Secondary | ICD-10-CM | POA: Diagnosis not present

## 2019-07-25 DIAGNOSIS — G8929 Other chronic pain: Secondary | ICD-10-CM | POA: Diagnosis not present

## 2019-07-25 DIAGNOSIS — M1990 Unspecified osteoarthritis, unspecified site: Secondary | ICD-10-CM | POA: Diagnosis not present

## 2019-07-25 DIAGNOSIS — S72142D Displaced intertrochanteric fracture of left femur, subsequent encounter for closed fracture with routine healing: Secondary | ICD-10-CM | POA: Diagnosis not present

## 2019-07-31 ENCOUNTER — Telehealth: Payer: Self-pay | Admitting: Family Medicine

## 2019-07-31 NOTE — Telephone Encounter (Signed)
Spoke with [employer]. She said she can't tell if it's new or chronic because pt was going to the bathroom at night by herself at 1st so [name] can't tell how long she's been having this urinary frequency at night. Recently given her health issues pt's daughter [name] moved her room downstairs to be closer to pt and that's when they 1st noticed the frequency in her urinating at night. [name] said she doesn't think she has a UTI because she has no other sxs no odor, no dysuria. [name] said when she usually has a UTI the urine is really dark but her urine is still really clear. [name] said that pt still does have the pessary in place she said she can tell it is helping because pt use to urinate all over her self and now she is holding it more she still does leak urine but not nearly as bad as before the pessary. [name] just didn't know if there was something that can help pt not urinate so much at night so she can get some sleep and not be up most of the night going back and forth to the bathroom.   Also [name] said that when pt was in the hospital last month they prescribed Losartan 50mg  they gave her 30 tabs but said future refills need to come from PCP, [name] said pt is almost out of med and request Dr. Glori Bickers to refill med. I can see the med was given at the hospital but it looks like they put an end date on the Rx so it came off of pt's med list once the end date came but pt is still taking med. [name] ask Dr. Glori Bickers to refill med

## 2019-07-31 NOTE — Telephone Encounter (Signed)
At last visit I had her hold the losartan because BP was too low  We can stay off of it unless she thinks bp is back up   The night time urination is a tough thing.  The pessary can only help so much.  This is so common in elderly people and the medications that sometimes help (like oxybutinin or detrol) often cause restlessness/ mental status change or dizziness/falls so I am apprehensive to use them   I recommend they touch base with her urologist to see if they can suggest something else  Thanks for letting me know

## 2019-07-31 NOTE — Telephone Encounter (Signed)
Spoke with [name] she said that pt's BP has been around 90s/60s but she has still been giving the Losartan, she didn't realize that at last OV Dr. Glori Bickers d/c med. [name] will stop the Losartan now and if she has any BP issues she will update [place].  Pt only went to Dr. Marco Collie once but will call back and see what they recommend. I did advise [name] of Dr. Marliss Coots instructions

## 2019-07-31 NOTE — Telephone Encounter (Signed)
It this new or chronic ?   Does she think she empties all the way?  Any suspicion of uti?  (odor to urine/dysuria)  I rev last urology note-she has a large cystocele (bladder prolapse) and pessary was recommended which I think she did try  Is she still using the pessary?

## 2019-07-31 NOTE — Telephone Encounter (Signed)
Patient's Daughter is requesting a call back in regards to a refill she is requesting.  Daughter is concerned about the patient getting up 3-4 times at night to use the bathroom. She stated that the patient is using the restroom when she goes but is not sure if there is something that could be prescribed to slow it down. Patient is wearing Depends and pads  C/B # [phone]

## 2019-08-01 DIAGNOSIS — I10 Essential (primary) hypertension: Secondary | ICD-10-CM | POA: Diagnosis not present

## 2019-08-01 DIAGNOSIS — F419 Anxiety disorder, unspecified: Secondary | ICD-10-CM | POA: Diagnosis not present

## 2019-08-01 DIAGNOSIS — S72142D Displaced intertrochanteric fracture of left femur, subsequent encounter for closed fracture with routine healing: Secondary | ICD-10-CM | POA: Diagnosis not present

## 2019-08-01 DIAGNOSIS — M1990 Unspecified osteoarthritis, unspecified site: Secondary | ICD-10-CM | POA: Diagnosis not present

## 2019-08-01 DIAGNOSIS — D649 Anemia, unspecified: Secondary | ICD-10-CM | POA: Diagnosis not present

## 2019-08-01 DIAGNOSIS — G8929 Other chronic pain: Secondary | ICD-10-CM | POA: Diagnosis not present

## 2019-08-02 ENCOUNTER — Telehealth: Payer: Self-pay | Admitting: Family Medicine

## 2019-08-02 DIAGNOSIS — S72142D Displaced intertrochanteric fracture of left femur, subsequent encounter for closed fracture with routine healing: Secondary | ICD-10-CM | POA: Diagnosis not present

## 2019-08-02 DIAGNOSIS — D649 Anemia, unspecified: Secondary | ICD-10-CM | POA: Diagnosis not present

## 2019-08-02 DIAGNOSIS — G8929 Other chronic pain: Secondary | ICD-10-CM | POA: Diagnosis not present

## 2019-08-02 DIAGNOSIS — F419 Anxiety disorder, unspecified: Secondary | ICD-10-CM | POA: Diagnosis not present

## 2019-08-02 DIAGNOSIS — M1990 Unspecified osteoarthritis, unspecified site: Secondary | ICD-10-CM | POA: Diagnosis not present

## 2019-08-02 DIAGNOSIS — I10 Essential (primary) hypertension: Secondary | ICD-10-CM | POA: Diagnosis not present

## 2019-08-02 NOTE — Telephone Encounter (Addendum)
Called [place] and she said PT looked at the spot after she called here. The PT said it doesn't look like a bedsore it looks more like a skin tear from pt getting up and down so quickly. It's about a dime size skin tear on her bottom. Michelle Gregory said it doesn't looked infected it's not really red or swollen besides that one circle spot. PT said that they can get a [facility] nurse to evaluate the spot but they are so booked that they wont be able to check it out until next week. Michelle Gregory wanted to know what Dr. Glori Bickers thinks she should do to care for the spot until the nurse evals it. Michelle Gregory did buy Desitin but she wasn't sure if that was okay and also should she put a band-aid or dressing on it? Please advise

## 2019-08-02 NOTE — Telephone Encounter (Signed)
Keep clean with soap and water  Desitin barrier cream is fine Avoid sitting on that spot if possible  A light covering is ok if she needs it to protect from friction from clothing/sheets etc  Have the [facility] nurse update me when they evaluate  Let me know in the meantime if symptoms worsen as well  Thanks

## 2019-08-02 NOTE — Telephone Encounter (Signed)
Spoke with [name] and gave her the instructions.  She will keep [place] updated as needed.

## 2019-08-02 NOTE — Telephone Encounter (Signed)
It depends what size/where, etc.  Can you please get more details?  Is she home currently ?  Does she have any outside care ? (is there a home agency to call)   Thanks

## 2019-08-02 NOTE — Telephone Encounter (Signed)
Patient's daughter [name] called today requesting a call back from you. She is wanting to know if there is anything they can do for a bed sore.   C/B # [phone]

## 2019-08-04 DIAGNOSIS — I10 Essential (primary) hypertension: Secondary | ICD-10-CM | POA: Diagnosis not present

## 2019-08-04 DIAGNOSIS — M1990 Unspecified osteoarthritis, unspecified site: Secondary | ICD-10-CM | POA: Diagnosis not present

## 2019-08-04 DIAGNOSIS — G8929 Other chronic pain: Secondary | ICD-10-CM | POA: Diagnosis not present

## 2019-08-04 DIAGNOSIS — F419 Anxiety disorder, unspecified: Secondary | ICD-10-CM | POA: Diagnosis not present

## 2019-08-04 DIAGNOSIS — S72142D Displaced intertrochanteric fracture of left femur, subsequent encounter for closed fracture with routine healing: Secondary | ICD-10-CM | POA: Diagnosis not present

## 2019-08-04 DIAGNOSIS — D649 Anemia, unspecified: Secondary | ICD-10-CM | POA: Diagnosis not present

## 2019-08-08 DIAGNOSIS — S72142D Displaced intertrochanteric fracture of left femur, subsequent encounter for closed fracture with routine healing: Secondary | ICD-10-CM | POA: Diagnosis not present

## 2019-08-08 DIAGNOSIS — I10 Essential (primary) hypertension: Secondary | ICD-10-CM | POA: Diagnosis not present

## 2019-08-08 DIAGNOSIS — F419 Anxiety disorder, unspecified: Secondary | ICD-10-CM | POA: Diagnosis not present

## 2019-08-08 DIAGNOSIS — M1990 Unspecified osteoarthritis, unspecified site: Secondary | ICD-10-CM | POA: Diagnosis not present

## 2019-08-08 DIAGNOSIS — D649 Anemia, unspecified: Secondary | ICD-10-CM | POA: Diagnosis not present

## 2019-08-08 DIAGNOSIS — G8929 Other chronic pain: Secondary | ICD-10-CM | POA: Diagnosis not present

## 2019-08-09 DIAGNOSIS — M1990 Unspecified osteoarthritis, unspecified site: Secondary | ICD-10-CM | POA: Diagnosis not present

## 2019-08-09 DIAGNOSIS — G8929 Other chronic pain: Secondary | ICD-10-CM | POA: Diagnosis not present

## 2019-08-09 DIAGNOSIS — F419 Anxiety disorder, unspecified: Secondary | ICD-10-CM | POA: Diagnosis not present

## 2019-08-09 DIAGNOSIS — D649 Anemia, unspecified: Secondary | ICD-10-CM | POA: Diagnosis not present

## 2019-08-09 DIAGNOSIS — S72142D Displaced intertrochanteric fracture of left femur, subsequent encounter for closed fracture with routine healing: Secondary | ICD-10-CM | POA: Diagnosis not present

## 2019-08-09 DIAGNOSIS — I10 Essential (primary) hypertension: Secondary | ICD-10-CM | POA: Diagnosis not present

## 2019-08-11 DIAGNOSIS — N812 Incomplete uterovaginal prolapse: Secondary | ICD-10-CM | POA: Diagnosis not present

## 2019-08-13 DIAGNOSIS — M1990 Unspecified osteoarthritis, unspecified site: Secondary | ICD-10-CM | POA: Diagnosis not present

## 2019-08-13 DIAGNOSIS — S72142D Displaced intertrochanteric fracture of left femur, subsequent encounter for closed fracture with routine healing: Secondary | ICD-10-CM | POA: Diagnosis not present

## 2019-08-13 DIAGNOSIS — I10 Essential (primary) hypertension: Secondary | ICD-10-CM | POA: Diagnosis not present

## 2019-08-13 DIAGNOSIS — G8929 Other chronic pain: Secondary | ICD-10-CM | POA: Diagnosis not present

## 2019-08-13 DIAGNOSIS — M109 Gout, unspecified: Secondary | ICD-10-CM | POA: Diagnosis not present

## 2019-08-13 DIAGNOSIS — S329XXD Fracture of unspecified parts of lumbosacral spine and pelvis, subsequent encounter for fracture with routine healing: Secondary | ICD-10-CM | POA: Diagnosis not present

## 2019-08-13 DIAGNOSIS — Z7982 Long term (current) use of aspirin: Secondary | ICD-10-CM | POA: Diagnosis not present

## 2019-08-13 DIAGNOSIS — F419 Anxiety disorder, unspecified: Secondary | ICD-10-CM | POA: Diagnosis not present

## 2019-08-13 DIAGNOSIS — Z85038 Personal history of other malignant neoplasm of large intestine: Secondary | ICD-10-CM | POA: Diagnosis not present

## 2019-08-13 DIAGNOSIS — F0391 Unspecified dementia with behavioral disturbance: Secondary | ICD-10-CM | POA: Diagnosis not present

## 2019-08-13 DIAGNOSIS — Z9181 History of falling: Secondary | ICD-10-CM | POA: Diagnosis not present

## 2019-08-13 DIAGNOSIS — Z87440 Personal history of urinary (tract) infections: Secondary | ICD-10-CM | POA: Diagnosis not present

## 2019-08-13 DIAGNOSIS — S52501D Unspecified fracture of the lower end of right radius, subsequent encounter for closed fracture with routine healing: Secondary | ICD-10-CM | POA: Diagnosis not present

## 2019-08-13 DIAGNOSIS — E876 Hypokalemia: Secondary | ICD-10-CM | POA: Diagnosis not present

## 2019-08-13 DIAGNOSIS — D649 Anemia, unspecified: Secondary | ICD-10-CM | POA: Diagnosis not present

## 2019-08-13 DIAGNOSIS — M545 Low back pain: Secondary | ICD-10-CM | POA: Diagnosis not present

## 2019-08-13 DIAGNOSIS — Z79891 Long term (current) use of opiate analgesic: Secondary | ICD-10-CM | POA: Diagnosis not present

## 2019-08-16 DIAGNOSIS — I10 Essential (primary) hypertension: Secondary | ICD-10-CM | POA: Diagnosis not present

## 2019-08-16 DIAGNOSIS — F419 Anxiety disorder, unspecified: Secondary | ICD-10-CM | POA: Diagnosis not present

## 2019-08-16 DIAGNOSIS — G8929 Other chronic pain: Secondary | ICD-10-CM | POA: Diagnosis not present

## 2019-08-16 DIAGNOSIS — S72142D Displaced intertrochanteric fracture of left femur, subsequent encounter for closed fracture with routine healing: Secondary | ICD-10-CM | POA: Diagnosis not present

## 2019-08-16 DIAGNOSIS — D649 Anemia, unspecified: Secondary | ICD-10-CM | POA: Diagnosis not present

## 2019-08-16 DIAGNOSIS — M1990 Unspecified osteoarthritis, unspecified site: Secondary | ICD-10-CM | POA: Diagnosis not present

## 2019-08-23 DIAGNOSIS — G8929 Other chronic pain: Secondary | ICD-10-CM | POA: Diagnosis not present

## 2019-08-23 DIAGNOSIS — F419 Anxiety disorder, unspecified: Secondary | ICD-10-CM | POA: Diagnosis not present

## 2019-08-23 DIAGNOSIS — I10 Essential (primary) hypertension: Secondary | ICD-10-CM | POA: Diagnosis not present

## 2019-08-23 DIAGNOSIS — D649 Anemia, unspecified: Secondary | ICD-10-CM | POA: Diagnosis not present

## 2019-08-23 DIAGNOSIS — S72142D Displaced intertrochanteric fracture of left femur, subsequent encounter for closed fracture with routine healing: Secondary | ICD-10-CM | POA: Diagnosis not present

## 2019-08-23 DIAGNOSIS — M1990 Unspecified osteoarthritis, unspecified site: Secondary | ICD-10-CM | POA: Diagnosis not present

## 2019-09-18 ENCOUNTER — Ambulatory Visit: Payer: MEDICARE | Admitting: Podiatry

## 2019-09-18 DIAGNOSIS — G894 Chronic pain syndrome: Secondary | ICD-10-CM | POA: Diagnosis not present

## 2019-09-18 DIAGNOSIS — Z79891 Long term (current) use of opiate analgesic: Secondary | ICD-10-CM | POA: Diagnosis not present

## 2019-09-19 DIAGNOSIS — S72142D Displaced intertrochanteric fracture of left femur, subsequent encounter for closed fracture with routine healing: Secondary | ICD-10-CM | POA: Diagnosis not present

## 2019-09-30 ENCOUNTER — Other Ambulatory Visit: Payer: Self-pay | Admitting: Family Medicine

## 2019-10-04 ENCOUNTER — Other Ambulatory Visit: Payer: Self-pay

## 2019-10-04 ENCOUNTER — Encounter: Payer: Self-pay | Admitting: Podiatry

## 2019-10-04 ENCOUNTER — Ambulatory Visit (INDEPENDENT_AMBULATORY_CARE_PROVIDER_SITE_OTHER): Payer: MEDICARE | Admitting: Podiatry

## 2019-10-04 DIAGNOSIS — M79675 Pain in left toe(s): Secondary | ICD-10-CM

## 2019-10-04 DIAGNOSIS — B351 Tinea unguium: Secondary | ICD-10-CM

## 2019-10-04 DIAGNOSIS — M79674 Pain in right toe(s): Secondary | ICD-10-CM

## 2019-10-09 NOTE — Progress Notes (Signed)
   SUBJECTIVE Patient presents to office today complaining of elongated, thickened nails that cause pain while ambulating in shoes. She is unable to trim her own nails. Patient is here for further evaluation and treatment.  Past Medical History:  Diagnosis Date  . Allergic rhinitis   . Anxiety   . C. difficile diarrhea 10/2017  . Cancer ([place])   . Depression   . GERD (gastroesophageal reflux disease)   . History of colon cancer   . History of recurrent UTIs   . Hyperlipidemia   . Hypertension   . Osteoarthritis   . Spinal compression fracture ([place])   . Vertigo     OBJECTIVE General Patient is awake, alert, and oriented x 3 and in no acute distress. Derm Skin is dry and supple bilateral. Negative open lesions or macerations. Remaining integument unremarkable. Nails are tender, long, thickened and dystrophic with subungual debris, consistent with onychomycosis, 1-5 bilateral. No signs of infection noted. Vasc  DP and PT pedal pulses palpable bilaterally. Temperature gradient within normal limits.  Neuro Epicritic and protective threshold sensation grossly intact bilaterally.  Musculoskeletal Exam No symptomatic pedal deformities noted bilateral. Muscular strength within normal limits.  ASSESSMENT 1. Onychodystrophic nails 1-5 bilateral with hyperkeratosis of nails.  2. Onychomycosis of nail due to dermatophyte bilateral 3. Pain in foot bilateral  PLAN OF CARE 1. Patient evaluated today.  2. Instructed to maintain good pedal hygiene and foot care.  3. Mechanical debridement of nails 1-5 bilaterally performed using a nail nipper. Filed with dremel without incident.  4. Return to clinic in 3 mos.    Edrick Kins, DPM [facility]  Dr. Edrick Kins, [address]                                        [place], [place] [address]                Office [phone]  Fax [phone]

## 2019-10-16 ENCOUNTER — Telehealth: Payer: Self-pay

## 2019-10-16 NOTE — Telephone Encounter (Signed)
Most likely yes - but we still need to learn more about it.   Unsure when it will be available to the general public.  They will start with health care workers in the hospital.   There is another vaccine likely to be approved so there may be choices by that time.

## 2019-10-16 NOTE — Telephone Encounter (Signed)
Patient's daughter contacted the office and is wondering if once the COVID vaccine comes available, if you think patient should or should not receive this vaccine? Please advise.

## 2019-10-17 NOTE — Telephone Encounter (Signed)
Pt's daughter notified of Dr. Marliss Coots comments and verbalized understanding

## 2019-11-27 ENCOUNTER — Ambulatory Visit: Payer: MEDICARE | Admitting: Family Medicine

## 2019-12-05 ENCOUNTER — Telehealth: Payer: Self-pay | Admitting: Family Medicine

## 2019-12-05 NOTE — Telephone Encounter (Signed)
Patient called the office back. She states she can be reached at [phone].

## 2019-12-05 NOTE — Telephone Encounter (Signed)
I spoke to [place].  Dementia is greatly worsening and pt is more agitated /argumentative and paranoid (no hallucinations however)  Disc opt for help including psychiatry/ neurology  and trial of an antipsychotic medication like seroquel (also discussing the mortality risks of this class of medication)  Mirtazapine is still working well at night   She has an appt tomorrow We plan to check a ua  Also ears (hearing is worse lately)   Will consider trial of medication  Also discussed resources for caregivers

## 2019-12-05 NOTE — Telephone Encounter (Signed)
Patient's Daughter [name] called  She is requesting a call back and asked to speak directly with Dr Glori Bickers. She stated she has a few concerns about the patient and her dementia that she does not feel comfortable talking about in front of the patient.  The patient has an appointment tomorrow but daughter did not want to discuss this since the patient will be present

## 2019-12-05 NOTE — Telephone Encounter (Signed)
I tried to call her but no answer and mailbox was full  I will try again later  If she calls back let her know that

## 2019-12-06 ENCOUNTER — Encounter: Payer: Self-pay | Admitting: Family Medicine

## 2019-12-06 ENCOUNTER — Ambulatory Visit (INDEPENDENT_AMBULATORY_CARE_PROVIDER_SITE_OTHER): Payer: MEDICARE | Admitting: Family Medicine

## 2019-12-06 ENCOUNTER — Other Ambulatory Visit: Payer: Self-pay

## 2019-12-06 VITALS — BP 124/80 | HR 85 | Temp 97.3°F | Ht 60.5 in | Wt 106.0 lb

## 2019-12-06 DIAGNOSIS — H6121 Impacted cerumen, right ear: Secondary | ICD-10-CM | POA: Diagnosis not present

## 2019-12-06 DIAGNOSIS — F0281 Dementia in other diseases classified elsewhere with behavioral disturbance: Secondary | ICD-10-CM

## 2019-12-06 DIAGNOSIS — D696 Thrombocytopenia, unspecified: Secondary | ICD-10-CM | POA: Diagnosis not present

## 2019-12-06 DIAGNOSIS — N390 Urinary tract infection, site not specified: Secondary | ICD-10-CM | POA: Diagnosis not present

## 2019-12-06 DIAGNOSIS — I10 Essential (primary) hypertension: Secondary | ICD-10-CM | POA: Diagnosis not present

## 2019-12-06 DIAGNOSIS — F02818 Dementia in other diseases classified elsewhere, unspecified severity, with other behavioral disturbance: Secondary | ICD-10-CM

## 2019-12-06 DIAGNOSIS — R4182 Altered mental status, unspecified: Secondary | ICD-10-CM | POA: Diagnosis not present

## 2019-12-06 DIAGNOSIS — G3109 Other frontotemporal dementia: Secondary | ICD-10-CM

## 2019-12-06 LAB — POC URINALSYSI DIPSTICK (AUTOMATED)
Bilirubin, UA: NEGATIVE
Blood, UA: NEGATIVE
Glucose, UA: NEGATIVE
Ketones, UA: NEGATIVE
Leukocytes, UA: NEGATIVE
Nitrite, UA: NEGATIVE
Protein, UA: NEGATIVE
Spec Grav, UA: 1.02 (ref 1.010–1.025)
Urobilinogen, UA: 0.2 E.U./dL
pH, UA: 6 (ref 5.0–8.0)

## 2019-12-06 MED ORDER — QUETIAPINE FUMARATE 25 MG PO TABS: 25.0000 mg | ORAL_TABLET | Freq: Two times a day (BID) | ORAL | 5 refills | Status: DC

## 2019-12-06 NOTE — Patient Instructions (Addendum)
I want to try very low dose Seroquel for agitation with dementia  It may help the paranoia and delusions   As we discussed there are risks of increased mortality in the elderly on this medication - so be aware  It can be sedating- if too much please hold it and let me know  We can adjust the dose to need as well   Keep schedule tight -try not to deviate   Urine looks clear today   Blood pressure is good- stay off the losartan   I recommend the book Keep Sharp by Coral Spikes -to learn about the brain and dementia  Also the book  The 36 Hour Day  Both should be available at any book store or on line

## 2019-12-06 NOTE — Assessment & Plan Note (Signed)
No bleeding/bruising or petechia

## 2019-12-06 NOTE — Assessment & Plan Note (Addendum)
With reduced hearing (worse on the R) Irrigated today with little success  Adv pt's daughter to get debrox or similar product and use twice weekly F/u for irrigation if it does not improve on its own in 2-4 wk

## 2019-12-06 NOTE — Assessment & Plan Note (Signed)
Blood pressure remains fine off of the losartan  bp in fair control at this time  BP Readings from Last 1 Encounters:  12/06/19 124/80   No changes needed Most recent labs reviewed  Disc lifstyle change with low sodium diet and exercise

## 2019-12-06 NOTE — Progress Notes (Signed)
Subjective:    Patient ID: Michelle Gregory, female    DOB: [birth year], 86 y.o.   MRN: [id number]  HPI  Pt presents for worsening dementia with delusions but not hallucinations  Wt Readings from Last 3 Encounters:  12/06/19 106 lb (48.1 kg)  07/05/19 116 lb 2.9 oz (52.7 kg)  06/22/19 120 lb (54.4 kg)   20.36 kg/m   Spoke with daughter/caregiver [name]) on the phone prior to visit  She lives with her  More agitated and argumentative lately  Confused at times  Mirtazapine has been helpful at night   Wants to check a urine specimen  Ua: Results for orders placed or performed in visit on 12/06/19  POCT Urinalysis Dipstick (Automated)  Result Value Ref Range   Color, UA Yellow    Clarity, UA Clear    Glucose, UA Negative Negative   Bilirubin, UA Negative    Ketones, UA Negative    Spec Grav, UA 1.020 1.010 - 1.025   Blood, UA Negative    pH, UA 6.0 5.0 - 8.0   Protein, UA Negative Negative   Urobilinogen, UA 0.2 0.2 or 1.0 E.U./dL   Nitrite, UA Negative    Leukocytes, UA Negative Negative    No dysuria or urine odor   Also worse hearing lately -the last 4 days  Wants to check for cerumen impaction   bp is stable today  No cp or palpitations or headaches or edema  No side effects to medicines  BP Readings from Last 3 Encounters:  12/06/19 124/80  07/11/19 94/62  07/08/19 115/66     Pulse Readings from Last 3 Encounters:  12/06/19 85  07/11/19 72  07/08/19 95    Patient Active Problem List   Diagnosis Date Noted  . Dementia with behavioral disturbance ([place])   . Acute blood loss as cause of postoperative anemia 06/27/2019  . Pelvic fracture ([place]) 06/27/2019  . Closed displaced intertrochanteric fracture of left femur ([place]) 06/21/2019  . Anxiety with depression 06/21/2019  . Diarrhea 03/24/2019  . Shingles 01/23/2019  . Fracture, ulna, distal 12/06/2018  . Thrombocytopenia ([place]) 12/06/2018  . Dementia, senile ([place]) 11/22/2018  . Hand injury, left, initial  encounter 09/01/2018  . H/O falling 05/03/2018  . Episodic confusion 05/03/2018  . Fall at home 03/29/2018  . Anxiety disorder 03/29/2018  . Fatigue 03/29/2018  . Chronic pain 02/09/2018  . Cerumen impaction 12/06/2017  . H/O Clostridium difficile infection 10/13/2017  . Recurrent UTI (urinary tract infection) 09/06/2017  . Auditory hallucinations 03/03/2017  . Female cystocele 04/13/2016  . Bowel habit changes 12/30/2015  . Compression fracture 12/30/2015  . Routine general medical examination at a health care facility 04/09/2015  . [place] intertrigo 03/28/2014  . Encounter for Medicare annual wellness exam 03/21/2013  . Gout 09/19/2012  . Age related osteoporosis 05/05/2011  . Degenerative lumbar disc 01/20/2011  . VARICOSE VEINS, LOWER EXTREMITIES 09/04/2010  . Prediabetes 01/08/2010  . Hyperlipidemia 05/09/2008  . Essential hypertension 05/09/2008  . HEMORRHOIDS, INTERNAL 05/09/2008  . Allergic rhinitis 05/09/2008  . Mild reactive airways disease 05/09/2008  . GERD 05/09/2008  . IBS 05/09/2008  . Osteoarthritis 05/09/2008  . History of malignant neoplasm of large intestine 05/09/2008   Past Medical History:  Diagnosis Date  . Allergic rhinitis   . Anxiety   . C. difficile diarrhea 10/2017  . Cancer ([place])   . Depression   . GERD (gastroesophageal reflux disease)   . History of colon cancer   . History  of recurrent UTIs   . Hyperlipidemia   . Hypertension   . Osteoarthritis   . Spinal compression fracture ([place])   . Vertigo    Past Surgical History:  Procedure Laterality Date  . APPENDECTOMY    . CATARACT EXTRACTION    . COLON RESECTION    . INTRAMEDULLARY (IM) NAIL INTERTROCHANTERIC Left 06/22/2019   Procedure: INTRAMEDULLARY (IM) NAIL INTERTROCHANTRIC;  Surgeon: Rod Can, MD;  Location: [facility];  Service: Orthopedics;  Laterality: Left;  . TONSILLECTOMY     Social History   Tobacco Use  . Smoking status: Never Smoker  . Smokeless tobacco: Never  Used  Substance Use Topics  . Alcohol use: No    Alcohol/week: 0.0 standard drinks  . Drug use: No   Family History  Problem Relation Age of Onset  . Prostate cancer Father   . Heart attack Mother   . Gout Mother    Allergies  Allergen Reactions  . Amoxicillin-Pot Clavulanate Diarrhea    Has patient had a PCN reaction causing immediate rash, facial/tongue/throat swelling, SOB or lightheadedness with hypotension: Yes Has patient had a PCN reaction causing severe rash involving mucus membranes or skin necrosis: No Has patient had a PCN reaction that required hospitalization: No Has patient had a PCN reaction occurring within the last 10 years: No If all of the above answers are "NO", then may proceed with Cephalosporin use.   Marland Kitchen Keflex [Cephalexin] Diarrhea  . Septra [Sulfamethoxazole-Trimethoprim] Nausea Only   Current Outpatient Medications on File Prior to Visit  Medication Sig Dispense Refill  . acetaminophen (TYLENOL) 325 MG tablet Take 1-2 tablets (325-650 mg total) by mouth every 4 (four) hours as needed for mild pain.    Marland Kitchen allopurinol (ZYLOPRIM) 100 MG tablet Take 1 tablet (100 mg total) by mouth daily. 180 tablet 1  . Ascorbic Acid (VITAMIN C) 100 MG tablet Take 100 mg by mouth daily.    Marland Kitchen aspirin 81 MG tablet Take 81 mg by mouth daily.      . calcium-vitamin D (CALCIUM 500 +D) 500 MG tablet Take 1 tablet by mouth daily.     Marland Kitchen CRANBERRY PO Take 1 capsule by mouth 2 (two) times daily.    Marland Kitchen gabapentin (NEURONTIN) 300 MG capsule Take 300 mg by mouth 2 (two) times daily.   6  . HYDROcodone-acetaminophen (NORCO/VICODIN) 5-325 MG tablet Take 1 tablet by mouth every 8 (eight) hours as needed for severe pain. 30 tablet 0  . meclizine (ANTIVERT) 25 MG tablet meclizine 25 mg tablet   25 mg by oral route.    . metoprolol tartrate (LOPRESSOR) 50 MG tablet TAKE 1 TABLET BY MOUTH  TWICE DAILY 180 tablet 3  . mirtazapine (REMERON) 15 MG tablet Take 1 tablet (15 mg total) by mouth at  bedtime. 30 tablet 11  . multivitamin (THERAGRAN) per tablet Take 1 tablet by mouth daily.      Marland Kitchen PARoxetine (PAXIL) 30 MG tablet paroxetine 30 mg tablet   30 mg by oral route.    . Probiotic Product (PROBIOTIC DAILY PO) Take 1 tablet by mouth daily.    . psyllium (HYDROCIL/METAMUCIL) 95 % PACK Take 1 packet by mouth daily. 240 each 0  . raloxifene (EVISTA) 60 MG tablet raloxifene 60 mg tablet   60 mg by oral route.    . valACYclovir (VALTREX) 1000 MG tablet valacyclovir 1 gram tablet   1000 mg by oral route.     No current facility-administered medications on file prior to  visit.    Review of Systems  Constitutional: Negative for activity change, appetite change, fatigue, fever and unexpected weight change.  HENT: Positive for hearing loss. Negative for congestion, ear discharge, ear pain, rhinorrhea, sinus pressure and sore throat.   Eyes: Negative for pain, redness and visual disturbance.  Respiratory: Negative for cough, shortness of breath and wheezing.   Cardiovascular: Negative for chest pain and palpitations.  Gastrointestinal: Negative for abdominal pain, blood in stool, constipation and diarrhea.  Endocrine: Negative for polydipsia and polyuria.  Genitourinary: Negative for dysuria, frequency and urgency.  Musculoskeletal: Negative for arthralgias, back pain and myalgias.  Skin: Negative for pallor and rash.  Allergic/Immunologic: Negative for environmental allergies.  Neurological: Negative for dizziness, syncope and headaches.  Hematological: Negative for adenopathy. Does not bruise/bleed easily.  Psychiatric/Behavioral: Positive for agitation, behavioral problems and confusion. Negative for decreased concentration, dysphoric mood and self-injury. The patient is not nervous/anxious.        Objective:   Physical Exam Constitutional:      General: She is not in acute distress.    Appearance: Normal appearance. She is well-developed and normal weight. She is not  ill-appearing or diaphoretic.  HENT:     Head: Normocephalic and atraumatic.     Right Ear: There is impacted cerumen.     Left Ear: There is impacted cerumen.     Ears:     Comments: More cerumen on the R      Mouth/Throat:     Mouth: Mucous membranes are moist.  Eyes:     General: No scleral icterus.    Conjunctiva/sclera: Conjunctivae normal.     Pupils: Pupils are equal, round, and reactive to light.  Neck:     Thyroid: No thyromegaly.     Vascular: No carotid bruit or JVD.  Cardiovascular:     Rate and Rhythm: Normal rate and regular rhythm.     Heart sounds: Normal heart sounds. No gallop.   Pulmonary:     Effort: Pulmonary effort is normal. No respiratory distress.     Breath sounds: Normal breath sounds. No wheezing or rales.  Abdominal:     General: Bowel sounds are normal. There is no abdominal bruit.     Palpations: Abdomen is soft.     Comments: No suprapubic tenderness or fullness   No cva tenderness   Musculoskeletal:     Cervical back: Normal range of motion and neck supple. No tenderness.     Right lower leg: No edema.     Left lower leg: No edema.  Lymphadenopathy:     Cervical: No cervical adenopathy.  Skin:    General: Skin is warm and dry.     Coloration: Skin is not pale.     Findings: No erythema or rash.  Neurological:     Mental Status: She is alert.     Cranial Nerves: No cranial nerve deficit.     Deep Tendon Reflexes: Reflexes are normal and symmetric.     Comments: No focal weakness   Psychiatric:        Behavior: Behavior is not agitated. Behavior is cooperative.        Thought Content: Thought content is paranoid and delusional.        Cognition and Memory: Cognition is impaired. She exhibits impaired recent memory.     Comments: Pleasant and cooperative today           Assessment & Plan:   Problem List Items Addressed This Visit  Cardiovascular and Mediastinum   Essential hypertension    Blood pressure remains fine off of  the losartan  bp in fair control at this time  BP Readings from Last 1 Encounters:  12/06/19 124/80   No changes needed Most recent labs reviewed  Disc lifstyle change with low sodium diet and exercise          Nervous and Auditory   Cerumen impaction    With reduced hearing (worse on the R) Irrigated today with little success  Adv pt's daughter to get debrox or similar product and use twice weekly F/u for irrigation if it does not improve on its own in 2-4 wk      Dementia with behavioral disturbance ([place]) - Primary    More confused with delusions and paranoia now   No hallucinations  Agitated more often during the day   Disc opt to help handle issues at home Rev pros/cons and risks of antipsychotic medication to help behavior issues and make pt less agitated  Understand the risk of increased mortality (reviewed this in detail)  Goal is improved quality of life being able to stay at home  Will try seroquel 25 mg bid - and urged her daughter to call us if too sedating or other side effects  Also enc her to keep her routine stable (she is doing well with this)  Also discussed caregiver stress       Relevant Medications   QUEtiapine (SEROQUEL) 25 MG tablet     Genitourinary   Recurrent UTI (urinary tract infection)    ua is clear today  Re assuring         Other   Thrombocytopenia ([place])    No bleeding/bruising or petechia        Other Visit Diagnoses    Altered mental status, unspecified altered mental status type       Relevant Orders   POCT Urinalysis Dipstick (Automated) (Completed)

## 2019-12-06 NOTE — Assessment & Plan Note (Signed)
ua is clear today  Reassuring   

## 2019-12-06 NOTE — Assessment & Plan Note (Signed)
More confused with delusions and paranoia now   No hallucinations  Agitated more often during the day   Disc opt to help handle issues at home Rev pros/cons and risks of antipsychotic medication to help behavior issues and make pt less agitated  Understand the risk of increased mortality (reviewed this in detail)  Goal is improved quality of life being able to stay at home  Will try seroquel 25 mg bid - and urged her daughter to call us if too sedating or other side effects  Also enc her to keep her routine stable (she is doing well with this)  Also discussed caregiver stress

## 2019-12-14 ENCOUNTER — Telehealth: Payer: Self-pay | Admitting: Family Medicine

## 2019-12-14 NOTE — Telephone Encounter (Signed)
Called Pierre Part and no answer and her VM box was full, will try to call back later

## 2019-12-14 NOTE — Telephone Encounter (Signed)
That is fine- if she is sleeping then she is comfortable.  The medication can sedate.  Glad it is helping.  Keep me posted

## 2019-12-14 NOTE — Telephone Encounter (Signed)
Sula Soda notified of Dr. Marliss Coots comments and verbalized understanding

## 2019-12-14 NOTE — Telephone Encounter (Signed)
Michelle Gregory (daughter) called  She is Giving pt full dose of medication (quesiapine) seems to be helping with paranoia and agitation. but Pt sleeps a lot more.  Pt started out with 1 pill a day saw no difference  Is this ok? Michelle Gregory is ok with pt sleeping

## 2020-01-03 ENCOUNTER — Encounter: Payer: Self-pay | Admitting: Podiatry

## 2020-01-03 ENCOUNTER — Ambulatory Visit (INDEPENDENT_AMBULATORY_CARE_PROVIDER_SITE_OTHER): Payer: MEDICARE | Admitting: Podiatry

## 2020-01-03 ENCOUNTER — Other Ambulatory Visit: Payer: Self-pay | Admitting: Family Medicine

## 2020-01-03 ENCOUNTER — Other Ambulatory Visit: Payer: Self-pay

## 2020-01-03 VITALS — Temp 97.2°F

## 2020-01-03 DIAGNOSIS — M79674 Pain in right toe(s): Secondary | ICD-10-CM | POA: Diagnosis not present

## 2020-01-03 DIAGNOSIS — B351 Tinea unguium: Secondary | ICD-10-CM | POA: Diagnosis not present

## 2020-01-03 DIAGNOSIS — M79675 Pain in left toe(s): Secondary | ICD-10-CM | POA: Diagnosis not present

## 2020-01-03 NOTE — Patient Instructions (Signed)

## 2020-01-09 NOTE — Progress Notes (Signed)
Subjective: Michelle Gregory presents today for follow up of painful mycotic nails b/l that are difficult to trim. Pain interferes with ambulation. Aggravating factors include wearing enclosed shoe gear. Pain is relieved with periodic professional debridement.   She voices no new pedal concerns on today's visit.  Allergies  Allergen Reactions  . Amoxicillin-Pot Clavulanate Diarrhea    Has patient had a PCN reaction causing immediate rash, facial/tongue/throat swelling, SOB or lightheadedness with hypotension: Yes Has patient had a PCN reaction causing severe rash involving mucus membranes or skin necrosis: No Has patient had a PCN reaction that required hospitalization: No Has patient had a PCN reaction occurring within the last 10 years: No If all of the above answers are "NO", then may proceed with Cephalosporin use.   Marland Kitchen Keflex [Cephalexin] Diarrhea  . Septra [Sulfamethoxazole-Trimethoprim] Nausea Only     Objective: Vitals:   01/03/20 1523  Temp: (!) 97.2 F (36.2 C)    Pt 86 y.o. year old female  in NAD. AAO x 3.   Vascular Examination:  Capillary refill time to digits immediate b/l. Palpable DP pulses b/l. Palpable PT pulses b/l. Pedal hair sparse b/l. Skin temperature gradient within normal limits b/l.  Dermatological Examination: Pedal skin with normal turgor, texture and tone bilaterally. No open wounds bilaterally. No interdigital macerations bilaterally. Toenails 1-5 b/l elongated, dystrophic, thickened, crumbly with subungual debris and tenderness to dorsal palpation.  Musculoskeletal: Normal muscle strength 5/5 to all lower extremity muscle groups bilaterally, no gross bony deformities bilaterally and no pain crepitus or joint limitation noted with ROM b/l  Neurological: Protective sensation intact 5/5 intact bilaterally with 10g monofilament b/l Vibratory sensation intact b/l  Assessment: 1. Pain due to onychomycosis of toenails of both feet    Plan: -Toenails 1-5  b/l were debrided in length and girth with sterile nail nippers and dremel without iatrogenic bleeding.  -Patient to continue soft, supportive shoe gear daily. -Patient to report any pedal injuries to medical professional immediately. -Patient/POA to call should there be question/concern in the interim.  Return in about 3 months (around 04/04/2020) for nail trim.

## 2020-02-13 ENCOUNTER — Telehealth: Payer: Self-pay | Admitting: *Deleted

## 2020-02-13 NOTE — Telephone Encounter (Signed)
Called daughter [name] and no answer and pt's VM box was full so couldn't leave a VM

## 2020-02-13 NOTE — Telephone Encounter (Signed)
Please ask if there is a specific time of day symptoms are worse - we can go up to 50 mg twice daily but if symptoms are worse at night then we may want to try the 50 mg in pm first  An increased dose will also increase sedation so use cautiously  We discussed before increase in risk of morbidity/mortality with this medication class when used with elderly and she voiced understanding.  Those risks still apply (just wanted to let her know)

## 2020-02-13 NOTE — Telephone Encounter (Signed)
Patient's daughter Michelle Gregory left a voicemail stating that her mom is taking Quetiapine twice a day. Michelle Gregory stated that the medication seems to be working, but is wearing off. Michelle Gregory stated that she gives her mom the medication morning and night and wants to know if she can increase the dose. Pharmacy [facility]

## 2020-02-14 MED ORDER — QUETIAPINE FUMARATE 25 MG PO TABS: ORAL_TABLET | ORAL | 5 refills | Status: DC

## 2020-02-14 NOTE — Telephone Encounter (Signed)
Michelle Gregory returned your call and said she will keep phone on her would you please call her back.

## 2020-02-14 NOTE — Telephone Encounter (Signed)
Daughter notified of Dr. Marliss Coots comments. Pt will take 2 tabs in am and 1 tab at bedtime and keep [place] posted

## 2020-02-14 NOTE — Telephone Encounter (Signed)
It is not approved for TID dosing  I would try 50 in the am to see if that calms through the day and continue 50 in the pm   I pended to send if she is ok with that  Please keep me posted

## 2020-02-14 NOTE — Telephone Encounter (Signed)
Spoke with daughter and she said the meds seem to be helping a lot but within the past week or 2 pt will get agitated mid day. She gets her 1st pill around 11:00 am when she wakes up and then the 2nd pill at bedtime. Pt becomes agitated around 3-4 pm, daughter is okay with increasing dose to whatever Dr. Glori Bickers thinks but she said since the am med and pt meds are working fine she questioned if there was a way to just add an extra dose mid day and just take the Seroquel 25 mg TID, but will do whatever Dr. Glori Bickers thinks is best in this situation, please advise

## 2020-03-28 ENCOUNTER — Telehealth: Payer: Self-pay | Admitting: Family Medicine

## 2020-03-28 NOTE — Telephone Encounter (Signed)
Patient's daughter, Michelle Gregory, called.  She's requesting references for respite care centers.  Michelle Gregory said she needs rest and would like to put her Mom in respite for 1-2 weeks.  She said she can go to [facility] or [facility].

## 2020-03-28 NOTE — Telephone Encounter (Signed)
Rosaria Ferries- which places do respite care, and does it require a referral or just a signed order?    Thanks

## 2020-03-29 NOTE — Telephone Encounter (Signed)
Called patients daughter back and gave her names of Assisted Living places to call. We dont need to do a Referral. She will start calling placed to see if they can accept her mother for respite care. She will call us if she needs anything like an FL2 if required.

## 2020-04-05 ENCOUNTER — Ambulatory Visit: Payer: MEDICARE | Admitting: Podiatry

## 2020-04-10 ENCOUNTER — Other Ambulatory Visit: Payer: Self-pay | Admitting: Family Medicine

## 2020-04-10 ENCOUNTER — Telehealth: Payer: Self-pay | Admitting: Family Medicine

## 2020-04-10 ENCOUNTER — Other Ambulatory Visit (INDEPENDENT_AMBULATORY_CARE_PROVIDER_SITE_OTHER): Payer: MEDICARE

## 2020-04-10 ENCOUNTER — Other Ambulatory Visit: Payer: Self-pay

## 2020-04-10 DIAGNOSIS — R410 Disorientation, unspecified: Secondary | ICD-10-CM

## 2020-04-10 DIAGNOSIS — R8290 Unspecified abnormal findings in urine: Secondary | ICD-10-CM | POA: Diagnosis not present

## 2020-04-10 LAB — POC URINALSYSI DIPSTICK (AUTOMATED)
Bilirubin, UA: NEGATIVE
Blood, UA: NEGATIVE
Glucose, UA: NEGATIVE
Ketones, UA: NEGATIVE
Nitrite, UA: POSITIVE
Protein, UA: NEGATIVE
Spec Grav, UA: 1.02 (ref 1.010–1.025)
Urobilinogen, UA: 0.2 E.U./dL
pH, UA: 7 (ref 5.0–8.0)

## 2020-04-10 MED ORDER — NITROFURANTOIN MONOHYD MACRO 100 MG PO CAPS: 100.0000 mg | ORAL_CAPSULE | Freq: Two times a day (BID) | ORAL | 0 refills | Status: DC

## 2020-04-10 NOTE — Telephone Encounter (Signed)
Possible uti  Culture pending-thanks I pended px for macrobid to take until we get a cx result  Try to encourage hydration   Please send to [facility] pharmacy

## 2020-04-10 NOTE — Telephone Encounter (Signed)
Thanks for letting me know  We will see how urine looks

## 2020-04-10 NOTE — Telephone Encounter (Signed)
Daughter notified of Dr. Marliss Coots comments and Rx sent to pharmacy, she will keep appt on Friday

## 2020-04-10 NOTE — Telephone Encounter (Signed)
That is ok- let me know what symptoms Michelle Gregory she think she may have  Do ua and cx please

## 2020-04-10 NOTE — Telephone Encounter (Signed)
Patient's daughter called earlier today to schedule an appointment for Friday (daughter requested Friday)    She called back and stated that she will be coming by to drop off a urine specimen today  because the nurse that comes to the home stated she is 99% sure that she has a uti    Advised Daughter that I will need to send a message to her provider to make them aware and to make sure it is okay that she can drop this off

## 2020-04-10 NOTE — Telephone Encounter (Signed)
Michelle Gregory daughter wanted to drop off urine sample today because nurse is there with pt. She will leave now to drop off urine but she does want to keep the appt for Friday. Daughter said her confusion is worsening, she is starting to hallucinate again, she is now very unsteady when she walks and her urine has a slight odor. Daughter said she isn't sure if the dementia is worsening or if she has a UTI, that's why she wants to keep the appt Friday to discuss if she needs her meds adjusted or if this is just from a UTI. Pt woke earlier today and went to the bathroom, when she came out she couldn't find her room which is new. They had to get pt back in her room and then when they went in the bathroom after her pt had taken off her underwear and pad and put it in the commode. Daughter is worried about pt so she will drop off urine sample now

## 2020-04-12 ENCOUNTER — Encounter: Payer: Self-pay | Admitting: Family Medicine

## 2020-04-12 ENCOUNTER — Ambulatory Visit (INDEPENDENT_AMBULATORY_CARE_PROVIDER_SITE_OTHER): Payer: MEDICARE | Admitting: Family Medicine

## 2020-04-12 ENCOUNTER — Other Ambulatory Visit: Payer: Self-pay

## 2020-04-12 VITALS — BP 132/70 | HR 81 | Temp 97.0°F | Ht 60.5 in | Wt 108.1 lb

## 2020-04-12 DIAGNOSIS — G3109 Other frontotemporal dementia: Secondary | ICD-10-CM | POA: Diagnosis not present

## 2020-04-12 DIAGNOSIS — F02818 Dementia in other diseases classified elsewhere, unspecified severity, with other behavioral disturbance: Secondary | ICD-10-CM

## 2020-04-12 DIAGNOSIS — L89151 Pressure ulcer of sacral region, stage 1: Secondary | ICD-10-CM | POA: Diagnosis not present

## 2020-04-12 DIAGNOSIS — N390 Urinary tract infection, site not specified: Secondary | ICD-10-CM | POA: Diagnosis not present

## 2020-04-12 DIAGNOSIS — F0281 Dementia in other diseases classified elsewhere with behavioral disturbance: Secondary | ICD-10-CM

## 2020-04-12 DIAGNOSIS — R410 Disorientation, unspecified: Secondary | ICD-10-CM | POA: Diagnosis not present

## 2020-04-12 DIAGNOSIS — L8990 Pressure ulcer of unspecified site, unspecified stage: Secondary | ICD-10-CM | POA: Insufficient documentation

## 2020-04-12 LAB — URINE CULTURE
MICRO NUMBER:: 10571295
SPECIMEN QUALITY:: ADEQUATE

## 2020-04-12 NOTE — Patient Instructions (Addendum)
Let's re check a urinalysis /culture about 7 or more days after finishing antibiotics (please drop off sample then)  Please call the day before    Move first seroquel dose to 3 pm to see if that helps with agitation  If this does not help let me know (or if any problems)     Continue to keep the pre ulcer area on buttock clean with soap and water   (avoid wet pad when able) Off load with sitting- change position/pillow/egg crate pad/ donut seat or pillow  Also get up and move on a regular schedule

## 2020-04-12 NOTE — Progress Notes (Signed)
Subjective:    Patient ID: Michelle Gregory, female    DOB: [birth year], 86 y.o.   MRN: [id number]  This visit occurred during the SARS-CoV-2 public health emergency.  Safety protocols were in place, including screening questions prior to the visit, additional usage of staff PPE, and extensive cleaning of exam room while observing appropriate contact time as indicated for disinfecting solutions.    HPI Pt presents for increasing confusion and uti   Wt Readings from Last 3 Encounters:  04/12/20 108 lb 2 oz (49 kg)  12/06/19 106 lb (48.1 kg)  07/05/19 116 lb 2.9 oz (52.7 kg)  appetite is good  Stays hungry  Does not eat healthy food all the time  20.77 kg/m  Lab Results  Component Value Date   HGBA1C 5.6 02/01/2019  ice cream at night  Hot tea at breakfast     Dropped off ua recently due to increased confusion with hallucinations (in the setting of dementia)  ua was positive so we covered with macrobid  Culture returned -e coli entirely pan sensitive (<16 for macrobid)   No pain with urination  Some odor to urine  Some mental status change    Last visit for advancing dementia with behavioral disturbance we started generic seroquel at 25 mg daily  (after discussion of side eff incl inc mortality with this age group)  This was later inc to 50 mg in am and 25 in pm to help more with mid day breakthrough symptoms   Did not see any difference  Sleeps well especially at night  (a little nap during the day)   Some sun downing   Takes pm dose at 10 pm   More paranoid /agitated    She takes mirtazapine 15 mg at bedtime for depression and anxiety  Also paxil 30 mg    CNA is coming in twice per week  Noted red area on buttocks - using desitin  Does sit a lot   Patient Active Problem List   Diagnosis Date Noted  . Pressure ulcer 04/12/2020  . Dementia with behavioral disturbance ([place])   . Acute blood loss as cause of postoperative anemia 06/27/2019  . History of pelvic  fracture 06/27/2019  . Closed displaced intertrochanteric fracture of left femur ([place]) 06/21/2019  . Anxiety with depression 06/21/2019  . Diarrhea 03/24/2019  . Shingles 01/23/2019  . Fracture, ulna, distal 12/06/2018  . Thrombocytopenia ([place]) 12/06/2018  . Dementia, senile ([place]) 11/22/2018  . Hand injury, left, initial encounter 09/01/2018  . H/O falling 05/03/2018  . Episodic confusion 05/03/2018  . Fall at home 03/29/2018  . Anxiety disorder 03/29/2018  . Fatigue 03/29/2018  . Chronic pain 02/09/2018  . Cerumen impaction 12/06/2017  . H/O Clostridium difficile infection 10/13/2017  . Recurrent UTI (urinary tract infection) 09/06/2017  . Auditory hallucinations 03/03/2017  . Female cystocele 04/13/2016  . Bowel habit changes 12/30/2015  . Compression fracture 12/30/2015  . Routine general medical examination at a health care facility 04/09/2015  . Candidal intertrigo 03/28/2014  . Encounter for Medicare annual wellness exam 03/21/2013  . Gout 09/19/2012  . Age related osteoporosis 05/05/2011  . Degenerative lumbar disc 01/20/2011  . VARICOSE VEINS, LOWER EXTREMITIES 09/04/2010  . Prediabetes 01/08/2010  . Hyperlipidemia 05/09/2008  . Essential hypertension 05/09/2008  . HEMORRHOIDS, INTERNAL 05/09/2008  . Allergic rhinitis 05/09/2008  . Mild reactive airways disease 05/09/2008  . GERD 05/09/2008  . IBS 05/09/2008  . Osteoarthritis 05/09/2008  . History of malignant neoplasm of  large intestine 05/09/2008   Past Medical History:  Diagnosis Date  . Allergic rhinitis   . Anxiety   . C. difficile diarrhea 10/2017  . Cancer ([place])   . Depression   . GERD (gastroesophageal reflux disease)   . History of colon cancer   . History of recurrent UTIs   . Hyperlipidemia   . Hypertension   . Osteoarthritis   . Spinal compression fracture ([place])   . Vertigo    Past Surgical History:  Procedure Laterality Date  . APPENDECTOMY    . CATARACT EXTRACTION    . COLON RESECTION     . INTRAMEDULLARY (IM) NAIL INTERTROCHANTERIC Left 06/22/2019   Procedure: INTRAMEDULLARY (IM) NAIL INTERTROCHANTRIC;  Surgeon: Rod Can, MD;  Location: [facility];  Service: Orthopedics;  Laterality: Left;  . TONSILLECTOMY     Social History   Tobacco Use  . Smoking status: Never Smoker  . Smokeless tobacco: Never Used  Substance Use Topics  . Alcohol use: No    Alcohol/week: 0.0 standard drinks  . Drug use: No   Family History  Problem Relation Age of Onset  . Prostate cancer Father   . Heart attack Mother   . Gout Mother    Allergies  Allergen Reactions  . Amoxicillin-Pot Clavulanate Diarrhea    Has patient had a PCN reaction causing immediate rash, facial/tongue/throat swelling, SOB or lightheadedness with hypotension: Yes Has patient had a PCN reaction causing severe rash involving mucus membranes or skin necrosis: No Has patient had a PCN reaction that required hospitalization: No Has patient had a PCN reaction occurring within the last 10 years: No If all of the above answers are "NO", then may proceed with Cephalosporin use.   Marland Kitchen Keflex [Cephalexin] Diarrhea  . Septra [Sulfamethoxazole-Trimethoprim] Nausea Only   Current Outpatient Medications on File Prior to Visit  Medication Sig Dispense Refill  . acetaminophen (TYLENOL) 325 MG tablet Take 1-2 tablets (325-650 mg total) by mouth every 4 (four) hours as needed for mild pain.    Marland Kitchen allopurinol (ZYLOPRIM) 100 MG tablet TAKE 2 TABLETS BY MOUTH  DAILY 180 tablet 1  . Ascorbic Acid (VITAMIN C) 100 MG tablet Take 100 mg by mouth daily.    Marland Kitchen aspirin 81 MG tablet Take 81 mg by mouth daily.      . calcium-vitamin D (CALCIUM 500 +D) 500 MG tablet Take 1 tablet by mouth daily.     Marland Kitchen CRANBERRY PO Take 1 capsule by mouth 2 (two) times daily.    Marland Kitchen gabapentin (NEURONTIN) 300 MG capsule Take 300 mg by mouth 2 (two) times daily.   6  . HYDROcodone-acetaminophen (NORCO/VICODIN) 5-325 MG tablet Take 1 tablet by mouth every 8 (eight)  hours as needed for severe pain. 30 tablet 0  . meclizine (ANTIVERT) 25 MG tablet meclizine 25 mg tablet   25 mg by oral route.    . metoprolol tartrate (LOPRESSOR) 50 MG tablet TAKE 1 TABLET BY MOUTH  TWICE DAILY 180 tablet 3  . mirtazapine (REMERON) 15 MG tablet Take 1 tablet (15 mg total) by mouth at bedtime. 30 tablet 11  . multivitamin (THERAGRAN) per tablet Take 1 tablet by mouth daily.      . nitrofurantoin, macrocrystal-monohydrate, (MACROBID) 100 MG capsule Take 1 capsule (100 mg total) by mouth 2 (two) times daily. 10 capsule 0  . PARoxetine (PAXIL) 30 MG tablet paroxetine 30 mg tablet   30 mg by oral route.    . Probiotic Product (PROBIOTIC DAILY PO) Take  1 tablet by mouth daily.    . psyllium (HYDROCIL/METAMUCIL) 95 % PACK Take 1 packet by mouth daily. 240 each 0  . QUEtiapine (SEROQUEL) 25 MG tablet Take 2 tabs by mouth every morning and one pill in the evening 90 tablet 5  . raloxifene (EVISTA) 60 MG tablet raloxifene 60 mg tablet   60 mg by oral route.    . valACYclovir (VALTREX) 1000 MG tablet valacyclovir 1 gram tablet   1000 mg by oral route.     No current facility-administered medications on file prior to visit.    Review of Systems  Constitutional: Negative for activity change, appetite change, fatigue, fever and unexpected weight change.  HENT: Negative for congestion, ear pain, rhinorrhea, sinus pressure and sore throat.   Eyes: Negative for pain, redness and visual disturbance.  Respiratory: Negative for cough, shortness of breath and wheezing.   Cardiovascular: Negative for chest pain and palpitations.  Gastrointestinal: Negative for abdominal pain, blood in stool, constipation and diarrhea.  Endocrine: Negative for polydipsia and polyuria.  Genitourinary: Negative for dysuria, frequency and urgency.  Musculoskeletal: Negative for arthralgias, back pain and myalgias.  Skin: Negative for pallor and rash.  Allergic/Immunologic: Negative for environmental  allergies.  Neurological: Negative for dizziness, syncope and headaches.  Hematological: Negative for adenopathy. Does not bruise/bleed easily.  Psychiatric/Behavioral: Positive for agitation and confusion. Negative for decreased concentration, dysphoric mood, self-injury and sleep disturbance. The patient is nervous/anxious.        Objective:   Physical Exam Constitutional:      General: She is not in acute distress.    Appearance: Normal appearance. She is normal weight. She is not ill-appearing.     Comments: Frail appearing with baseline dementia  HENT:     Head: Normocephalic and atraumatic.     Mouth/Throat:     Mouth: Mucous membranes are moist.  Eyes:     General: No scleral icterus.    Conjunctiva/sclera: Conjunctivae normal.     Pupils: Pupils are equal, round, and reactive to light.  Neck:     Vascular: No carotid bruit.  Cardiovascular:     Rate and Rhythm: Normal rate and regular rhythm.     Heart sounds: Normal heart sounds.  Pulmonary:     Effort: Pulmonary effort is normal. No respiratory distress.     Breath sounds: Normal breath sounds. No wheezing or rales.  Abdominal:     General: Abdomen is flat. Bowel sounds are normal.     Tenderness: There is no right CVA tenderness or left CVA tenderness.     Comments: No suprapubic tenderness or fullness     Musculoskeletal:     Cervical back: Neck supple. No tenderness.     Right lower leg: No edema.     Left lower leg: No edema.  Lymphadenopathy:     Cervical: No cervical adenopathy.  Skin:    General: Skin is warm and dry.     Coloration: Skin is not pale.     Findings: Erythema present. No rash.     Comments: Erythematous area (oval shaped) w/o any skin breakdown R inner buttock  nontender No drainage or fluctuance  Neurological:     Mental Status: She is alert.     Cranial Nerves: No cranial nerve deficit.     Sensory: No sensory deficit.     Comments: Unsteady gait- helped by family  Psychiatric:          Attention and Perception: Attention normal.  Cognition and Memory: Cognition is impaired. Memory is impaired. She exhibits impaired recent memory.     Comments: Calm  Baseline dementia but not agitated today  Able to answer some questions Does not remember me and asks where the doctor is            Assessment & Plan:   Problem List Items Addressed This Visit      Nervous and Auditory   Dementia with behavioral disturbance ([place]) - Primary    Struggling more with hallucinations/agitation in the late afternoon  Currently takes seroquel 50 in and 25 in pm  Will change am dose to 3:00 to see if this covers agitated period of time better  Sleeps well with mirtazapine  Pt is responsive and pleasant today  No doubt current UTI may worsen her mental status (will check culture after abx to make sure she is treated adequately)        Musculoskeletal and Integument   Pressure ulcer    Very early -stage 1 on R inner buttock  Recommend clean with soap and water and use desitin or other barrier cream and off load area while sitting/lying  Avoid wet undergarments if possible Discussed use of donut or egg crate pad  Asked family/caregivers to alert [place] re: any worsening          Genitourinary   Recurrent UTI (urinary tract infection)    This uti is pan sensitive e coli- taking macrobid (has had 4 doses)  Family unsure if they see improvement in mental status Enc good fluid intake  Re check cx a week after finishing tx        Other   Episodic confusion    Layered upon dementia with more paranoia and hallucination recently (esp late afternoon)  tx current uti-hopeful this will help some   Taking seroquel (see a/p for dementia)  Also mirtazapine and paxil  Pt is not disoriented at visit today

## 2020-04-13 NOTE — Assessment & Plan Note (Signed)
Very early -stage 1 on R inner buttock  Recommend clean with soap and water and use desitin or other barrier cream and off load area while sitting/lying  Avoid wet undergarments if possible Discussed use of donut or egg crate pad  Asked family/caregivers to alert [place] re: any worsening

## 2020-04-13 NOTE — Assessment & Plan Note (Signed)
This uti is pan sensitive e coli- taking macrobid (has had 4 doses)  Family unsure if they see improvement in mental status Enc good fluid intake  Re check cx a week after finishing tx

## 2020-04-13 NOTE — Assessment & Plan Note (Signed)
Layered upon dementia with more paranoia and hallucination recently (esp late afternoon)  tx current uti-hopeful this will help some   Taking seroquel (see a/p for dementia)  Also mirtazapine and paxil  Pt is not disoriented at visit today

## 2020-04-13 NOTE — Assessment & Plan Note (Signed)
Struggling more with hallucinations/agitation in the late afternoon  Currently takes seroquel 50 in and 25 in pm  Will change am dose to 3:00 to see if this covers agitated period of time better  Sleeps well with mirtazapine  Pt is responsive and pleasant today  No doubt current UTI may worsen her mental status (will check culture after abx to make sure she is treated adequately)

## 2020-04-16 ENCOUNTER — Ambulatory Visit (INDEPENDENT_AMBULATORY_CARE_PROVIDER_SITE_OTHER): Payer: MEDICARE | Admitting: Family Medicine

## 2020-04-16 ENCOUNTER — Encounter: Payer: Self-pay | Admitting: Family Medicine

## 2020-04-16 ENCOUNTER — Other Ambulatory Visit: Payer: Self-pay

## 2020-04-16 VITALS — BP 122/70 | HR 69 | Temp 97.3°F | Wt 106.6 lb

## 2020-04-16 DIAGNOSIS — H6123 Impacted cerumen, bilateral: Secondary | ICD-10-CM | POA: Diagnosis not present

## 2020-04-16 NOTE — Progress Notes (Signed)
Subjective:    Patient ID: Michelle Gregory, female    DOB: April 25, 1923, 86 y.o.   MRN: [id number]  This visit occurred during the SARS-CoV-2 public health emergency.  Safety protocols were in place, including screening questions prior to the visit, additional usage of staff PPE, and extensive cleaning of exam room while observing appropriate contact time as indicated for disinfecting solutions.    HPI Pt presents with acute hearing loss   This occurred over the weekend Has never worn hearing aides in the past   ? Wax build up  Daughter put wax dissolving drops in ears several days to loosen it up  Has had irrigation as well in the past   No loud noise exp (but has to turn up the TV)   Patient Active Problem List   Diagnosis Date Noted  . Pressure ulcer 04/12/2020  . Dementia with behavioral disturbance ([place])   . Acute blood loss as cause of postoperative anemia 06/27/2019  . History of pelvic fracture 06/27/2019  . Closed displaced intertrochanteric fracture of left femur ([place]) 06/21/2019  . Anxiety with depression 06/21/2019  . Diarrhea 03/24/2019  . Shingles 01/23/2019  . Fracture, ulna, distal 12/06/2018  . Thrombocytopenia ([place]) 12/06/2018  . Dementia, senile ([place]) 11/22/2018  . Hand injury, left, initial encounter 09/01/2018  . H/O falling 05/03/2018  . Episodic confusion 05/03/2018  . Fall at home 03/29/2018  . Anxiety disorder 03/29/2018  . Fatigue 03/29/2018  . Chronic pain 02/09/2018  . Cerumen impaction 12/06/2017  . H/O Clostridium difficile infection 10/13/2017  . Recurrent UTI (urinary tract infection) 09/06/2017  . Auditory hallucinations 03/03/2017  . Female cystocele 04/13/2016  . Bowel habit changes 12/30/2015  . Compression fracture 12/30/2015  . Routine general medical examination at a health care facility 04/09/2015  . Candidal intertrigo 03/28/2014  . Bilateral hearing loss due to cerumen impaction 01/17/2014  . Encounter for Medicare annual  wellness exam 03/21/2013  . Gout 09/19/2012  . Age related osteoporosis 05/05/2011  . Degenerative lumbar disc 01/20/2011  . VARICOSE VEINS, LOWER EXTREMITIES 09/04/2010  . Prediabetes 01/08/2010  . Hyperlipidemia 05/09/2008  . Essential hypertension 05/09/2008  . HEMORRHOIDS, INTERNAL 05/09/2008  . Allergic rhinitis 05/09/2008  . Mild reactive airways disease 05/09/2008  . GERD 05/09/2008  . IBS 05/09/2008  . Osteoarthritis 05/09/2008  . History of malignant neoplasm of large intestine 05/09/2008   Past Medical History:  Diagnosis Date  . Allergic rhinitis   . Anxiety   . C. difficile diarrhea 10/2017  . Cancer ([place])   . Depression   . GERD (gastroesophageal reflux disease)   . History of colon cancer   . History of recurrent UTIs   . Hyperlipidemia   . Hypertension   . Osteoarthritis   . Spinal compression fracture ([place])   . Vertigo    Past Surgical History:  Procedure Laterality Date  . APPENDECTOMY    . CATARACT EXTRACTION    . COLON RESECTION    . INTRAMEDULLARY (IM) NAIL INTERTROCHANTERIC Left 06/22/2019   Procedure: INTRAMEDULLARY (IM) NAIL INTERTROCHANTRIC;  Surgeon: Rod Can, MD;  Location: [facility];  Service: Orthopedics;  Laterality: Left;  . TONSILLECTOMY     Social History   Tobacco Use  . Smoking status: Never Smoker  . Smokeless tobacco: Never Used  Substance Use Topics  . Alcohol use: No    Alcohol/week: 0.0 standard drinks  . Drug use: No   Family History  Problem Relation Age of Onset  . Prostate cancer  Father   . Heart attack Mother   . Gout Mother    Allergies  Allergen Reactions  . Amoxicillin-Pot Clavulanate Diarrhea    Has patient had a PCN reaction causing immediate rash, facial/tongue/throat swelling, SOB or lightheadedness with hypotension: Yes Has patient had a PCN reaction causing severe rash involving mucus membranes or skin necrosis: No Has patient had a PCN reaction that required hospitalization: No Has patient had a  PCN reaction occurring within the last 10 years: No If all of the above answers are "NO", then may proceed with Cephalosporin use.   Marland Kitchen Keflex [Cephalexin] Diarrhea  . Septra [Sulfamethoxazole-Trimethoprim] Nausea Only   Current Outpatient Medications on File Prior to Visit  Medication Sig Dispense Refill  . acetaminophen (TYLENOL) 325 MG tablet Take 1-2 tablets (325-650 mg total) by mouth every 4 (four) hours as needed for mild pain.    Marland Kitchen allopurinol (ZYLOPRIM) 100 MG tablet TAKE 2 TABLETS BY MOUTH  DAILY 180 tablet 1  . Ascorbic Acid (VITAMIN C) 100 MG tablet Take 100 mg by mouth daily.    Marland Kitchen aspirin 81 MG tablet Take 81 mg by mouth daily.      . calcium-vitamin D (CALCIUM 500 +D) 500 MG tablet Take 1 tablet by mouth daily.     Marland Kitchen CRANBERRY PO Take 1 capsule by mouth 2 (two) times daily.    Marland Kitchen gabapentin (NEURONTIN) 300 MG capsule Take 300 mg by mouth 2 (two) times daily.   6  . HYDROcodone-acetaminophen (NORCO/VICODIN) 5-325 MG tablet Take 1 tablet by mouth every 8 (eight) hours as needed for severe pain. 30 tablet 0  . meclizine (ANTIVERT) 25 MG tablet meclizine 25 mg tablet   25 mg by oral route.    . metoprolol tartrate (LOPRESSOR) 50 MG tablet TAKE 1 TABLET BY MOUTH  TWICE DAILY 180 tablet 3  . mirtazapine (REMERON) 15 MG tablet Take 1 tablet (15 mg total) by mouth at bedtime. 30 tablet 11  . multivitamin (THERAGRAN) per tablet Take 1 tablet by mouth daily.      Marland Kitchen PARoxetine (PAXIL) 30 MG tablet paroxetine 30 mg tablet   30 mg by oral route.    . Probiotic Product (PROBIOTIC DAILY PO) Take 1 tablet by mouth daily.    . psyllium (HYDROCIL/METAMUCIL) 95 % PACK Take 1 packet by mouth daily. 240 each 0  . QUEtiapine (SEROQUEL) 25 MG tablet Take 2 tabs by mouth every morning and one pill in the evening 90 tablet 5  . raloxifene (EVISTA) 60 MG tablet raloxifene 60 mg tablet   60 mg by oral route.    . valACYclovir (VALTREX) 1000 MG tablet valacyclovir 1 gram tablet   1000 mg by oral route.      No current facility-administered medications on file prior to visit.     Review of Systems  Constitutional: Negative for activity change, appetite change, fatigue, fever and unexpected weight change.  HENT: Positive for hearing loss. Negative for congestion, ear discharge, ear pain, rhinorrhea, sinus pressure, sore throat and voice change.   Eyes: Negative for pain, redness and visual disturbance.  Respiratory: Negative for cough, shortness of breath and wheezing.   Cardiovascular: Negative for chest pain and palpitations.  Gastrointestinal: Negative for abdominal pain, blood in stool, constipation and diarrhea.  Endocrine: Negative for polydipsia and polyuria.  Genitourinary: Negative for dysuria, frequency and urgency.  Musculoskeletal: Negative for arthralgias, back pain and myalgias.  Skin: Negative for pallor and rash.  Allergic/Immunologic: Negative for environmental allergies.  Neurological: Negative for dizziness, syncope and headaches.  Hematological: Negative for adenopathy. Does not bruise/bleed easily.  Psychiatric/Behavioral: Positive for confusion. Negative for decreased concentration and dysphoric mood. The patient is not nervous/anxious.        Dementia       Objective:   Physical Exam Constitutional:      General: She is not in acute distress.    Appearance: Normal appearance. She is normal weight. She is not ill-appearing.     Comments: Frail appearing elderly female    HENT:     Head: Normocephalic and atraumatic.     Right Ear: There is impacted cerumen.     Left Ear: There is impacted cerumen.     Ears:     Comments: Bilateral cerumen impaction with poor hearing  Eyes:     Conjunctiva/sclera: Conjunctivae normal.     Pupils: Pupils are equal, round, and reactive to light.  Cardiovascular:     Rate and Rhythm: Normal rate and regular rhythm.  Musculoskeletal:     Cervical back: Normal range of motion.  Lymphadenopathy:     Cervical: No cervical  adenopathy.  Skin:    General: Skin is warm and dry.     Coloration: Skin is not pale.     Findings: No erythema or rash.  Neurological:     Mental Status: She is alert.     Cranial Nerves: No cranial nerve deficit.  Psychiatric:        Mood and Affect: Mood normal.     Comments: Calm today            Assessment & Plan:   Problem List Items Addressed This Visit      Nervous and Auditory   Bilateral hearing loss due to cerumen impaction - Primary    Unable to get cerumen out today -despite use of debrox at home Ref made to ENT for specialist attention/cerumen removal  inst to call if ear pain or any other new symptoms   If hearing does not improve after cerumen removal-will update       Relevant Orders   Ambulatory referral to ENT

## 2020-04-16 NOTE — Assessment & Plan Note (Signed)
Unable to get cerumen out today -despite use of debrox at home Ref made to ENT for specialist attention/cerumen removal  inst to call if ear pain or any other new symptoms   If hearing does not improve after cerumen removal-will update

## 2020-04-16 NOTE — Patient Instructions (Addendum)
Bring in a urine sample on Friday please  Call that morning to let us know you are coming   I placed an ENT referral for wax removal  Keep using the drops to loosen it up   If any new symptoms please let us know

## 2020-04-17 ENCOUNTER — Telehealth: Payer: Self-pay

## 2020-04-17 NOTE — Telephone Encounter (Signed)
That is great!   Keep me posted Will route to [employer] to disregard the referral

## 2020-04-17 NOTE — Telephone Encounter (Signed)
Spoke w/daughter.  Per pt wax must have dissolved/came out.  She woke up next day and could hear fine.  No problems.  Requests 04-16-20 ENT referral be closed.  If any future problems will c/b.

## 2020-04-19 ENCOUNTER — Other Ambulatory Visit (INDEPENDENT_AMBULATORY_CARE_PROVIDER_SITE_OTHER): Payer: MEDICARE

## 2020-04-19 ENCOUNTER — Other Ambulatory Visit: Payer: Self-pay

## 2020-04-19 DIAGNOSIS — Z87440 Personal history of urinary (tract) infections: Secondary | ICD-10-CM | POA: Diagnosis not present

## 2020-04-19 DIAGNOSIS — N390 Urinary tract infection, site not specified: Secondary | ICD-10-CM

## 2020-04-19 LAB — POC URINALSYSI DIPSTICK (AUTOMATED)
Bilirubin, UA: NEGATIVE
Blood, UA: NEGATIVE
Glucose, UA: NEGATIVE
Ketones, UA: NEGATIVE
Nitrite, UA: NEGATIVE
Protein, UA: NEGATIVE
Spec Grav, UA: 1.025 (ref 1.010–1.025)
Urobilinogen, UA: 0.2 E.U./dL
pH, UA: 6 (ref 5.0–8.0)

## 2020-04-20 LAB — URINE CULTURE
MICRO NUMBER:: 10608234
SPECIMEN QUALITY:: ADEQUATE

## 2020-04-25 ENCOUNTER — Other Ambulatory Visit: Payer: Self-pay

## 2020-04-25 NOTE — Patient Outreach (Signed)
[facility]) Care Management  04/25/2020  SHIANE WENBERG 1923-01-07 [id number]    New referral: Unknown source of referral--reassignment from [place] record and unable to determine referral source. Recent UTI and increased confusion.  Placed call to contact person which is daughter. No answer. Left a message requesting a call back.  PLAN: will mail outreach letter and attempt again in 3 days.  Tomasa Rand, RN, BSN, CEN [facility] [employer] [phone]

## 2020-04-30 ENCOUNTER — Other Ambulatory Visit: Payer: Self-pay

## 2020-04-30 NOTE — Patient Outreach (Signed)
[place] [facility]) Care Management  04/30/2020  Michelle Gregory 15-Dec-1922 [id number]   Telephone screening:  Placed call to [facility] as the point of contact for patient.  Mrs. Coble confirmed [place].  Reports that her mother lives with her and her husband.  Reports she has a caregiver who come weekly to assist with bathing.  Daughter reports that she is interested in respite care as her mothers dementia is getting worse.  Daughter reports patient is able to toilet herself and feed herself. Daughter reports that she manages all patients medication without difficultly. Denies any concerns about medication at this time.  Daughter reports that patient has a history of frequent UTIs due to a dropped bladder.  Reports patient is now taking a probiotic and cranberry and UTI's have decreased.  Reports patient has frequent falls for unknown reason. Last fall was 04/26/2020. Daughter states patient was walking with walker and tipped over. No injury except bruising.   Reviewed skin care and concern for prolonged sitting. Daughter is observing skin and will report any changes to MD.    Daughter states the one thing she would like help with is respite care. Daughter reports things are getting more trying with decrease in mental status.   Daughter verbalizes she is health care power of attorney and manages all aspects of care.    PLAN: will place order for social worker to assist with respite care. No nursing needs identified.  Tomasa Rand, RN, BSN, CEN [facility] [employer] [phone]

## 2020-05-07 ENCOUNTER — Other Ambulatory Visit: Payer: Self-pay | Admitting: *Deleted

## 2020-05-07 NOTE — Patient Outreach (Addendum)
[place] [facility]) Care Management  05/07/2020  HAWA HENLY 1923/06/29 [id number]  CSW received referral on 04/30/2020 for respite care/support.  CSW made contact today with pt's daughter/HCPOA; [name].  CSW confirmed pt's identity and introduced self, role and reason for call. Per daughter, she is an only child and has no kids; stating, "My husband and I are about all she has".  Pt lives with daughter and her husband.   Per daughter, pt requires supervision due to some dementia/sundowners, as well as her age.  Pt does have a sitter for about 3 hours twice a week which allows for some bathing assistance and for daughter to run errands, etc.  "My husband and I would like to get some time off- even to go out to dinner sometime".  CSW validated daughters feelings of being at times overloaded/overwhelmed and in need of some in home support.  They do not want to place pt; not even for a respite type stay due to her dementia and concerns she would become more disoriented and more "paranoid".  Per daughter, pt takes some meds to help with the dementia/paranoia and is encouraged to ask PCP for adjustments/additional med needs as needed to help with sleep and day to day management.   CSW discussed possible in-home assistance/support options for them to consider. Per daughter, pt has a LTC policy that she thinks will cover/reimburse$50/day.  CSW suggested she contact the agent or company to seek out more clarification of what it will cover (in home? ALF? Etc) and CSW offered to assist if needed- will need daughter to give permission for them to speak with CSW about the coverage policy.    CSW will email and mail daughter a list of private duty care agencies to [facility] for in home care arrangements.   CSW will plan a follow up call to daughter next week to ensure receipt of material as well as for updates.   Eduard Clos, MSW, [place] Worker  [place] [phone]

## 2020-05-08 ENCOUNTER — Encounter: Payer: Self-pay | Admitting: *Deleted

## 2020-05-09 ENCOUNTER — Telehealth: Payer: Self-pay | Admitting: Family Medicine

## 2020-05-09 MED ORDER — MEMANTINE HCL 5 MG PO TABS: 5.0000 mg | ORAL_TABLET | Freq: Two times a day (BID) | ORAL | 1 refills | Status: DC

## 2020-05-09 NOTE — Telephone Encounter (Signed)
Spoke with [name] (daughter) and she said pt's agitation and paranoia are getting better with the seroquel, but the dementia (memory) is a lot worse. Pt has a CNA that comes and helps her with her ADLs and last week CNA could run pt's shower and pt would know how to take a shower with no prompts, yesterday when pt took a shower the CNA had to tell pt step by step on what to do while in the shower (ex. Put soap on rag, wash face, wash body, get towel ect...) but today pt brushed her teeth and used the bathroom with no issues. [name]'s main concern is her memory but she said last time she talked to PCP about memory meds she was advise that they have a high mortality rate and she is already on a med that puts her at an higer mortality rate. Daughter said she is however trying to give pt more quality of life not "quanitity" of life so she will do what every Dr. Glori Bickers suggest. If Dr. Glori Bickers thinks pt is okay with trying a memory med (aware it wont reverse it), she is okay with that and uses [place]

## 2020-05-09 NOTE — Telephone Encounter (Signed)
I cannot call her today (I can't talk)  Please ask if she is aiming to control the behavioral symptoms of dementia (agitation/hallucinations etc) - if so we can continue to titrate up seroquel slowly  If she is interested in medication to slow down progression (this is progressive and nothing can cure or stop it) - may want to try namenda or aricept   Let me know what she says Thanks

## 2020-05-09 NOTE — Telephone Encounter (Signed)
Patient's Daughter [name] is requesting a call back  She stated the patient's Dementia is getting worse., She stated it was discussed that they patient could be prescribed medication but at the time the [name] did not want to start her on any medication. [name] said that it may be time to start a medication but wanted to speak with you first. She stated that the patient has a CMA that comes to her home. She has not seen her in a week and when she came in she even stated that the patient's Dementia seems to be getting worse    Lynns' Call back # [phone]

## 2020-05-09 NOTE — Telephone Encounter (Signed)
Left VM requesting pt's daughter [name] to call the office back

## 2020-05-09 NOTE — Telephone Encounter (Signed)
Daughter notified of Dr. Marliss Coots comments and that Rx sent to pharmacy. Dosing instructions reviewed with daughter

## 2020-05-09 NOTE — Telephone Encounter (Signed)
I sent in Namenda  Please start with one pill daily  After a week if no side effects increase to 1 pill twice daily  After a month if doing well we can gradually increase to 10 mg daily   Goal is to slow down progression of dementia    There will be a side effect list with the px- watch for dizziness or GI upset  If this makes her worse instead of better or agitated/ more confused stop it and let me know

## 2020-05-15 ENCOUNTER — Other Ambulatory Visit: Payer: Self-pay | Admitting: *Deleted

## 2020-05-16 NOTE — Patient Outreach (Signed)
[facility]) Care Management  05/16/2020  Michelle Gregory 1923/04/23 [id number]   CSW spoke with pt/s daughter/HCPOA on 05/15/2020 who reports she has contacted pt's LTC Insurance rep to determine policy coverage.  She is currently getting the CNA assistance at home covered by the LTC for a a few hours weekly.  She is looking into a respite stay for her mother at [facility] and plans to tour and meet with them next week.   Pt's daughter also states she has received the info mailed to her (emailed also) and is appreciative of the resources.  CSW will plan to touch base again in the next 10 days for update on the respite stay option.   Eduard Clos, MSW, Derma Worker  Michelle Gregory [phone]

## 2020-05-22 DIAGNOSIS — G894 Chronic pain syndrome: Secondary | ICD-10-CM | POA: Diagnosis not present

## 2020-05-23 ENCOUNTER — Emergency Department (HOSPITAL_COMMUNITY): Payer: MEDICARE

## 2020-05-23 ENCOUNTER — Encounter (HOSPITAL_COMMUNITY): Payer: Self-pay

## 2020-05-23 ENCOUNTER — Observation Stay (HOSPITAL_COMMUNITY)
Admission: EM | Admit: 2020-05-23 | Discharge: 2020-05-24 | Disposition: A | Discharge status: Home / Self Care | Payer: MEDICARE | Attending: Family Medicine | Admitting: Family Medicine

## 2020-05-23 ENCOUNTER — Other Ambulatory Visit: Payer: Self-pay

## 2020-05-23 DIAGNOSIS — Z85038 Personal history of other malignant neoplasm of large intestine: Secondary | ICD-10-CM | POA: Diagnosis not present

## 2020-05-23 DIAGNOSIS — F0281 Dementia in other diseases classified elsewhere with behavioral disturbance: Secondary | ICD-10-CM

## 2020-05-23 DIAGNOSIS — I10 Essential (primary) hypertension: Secondary | ICD-10-CM | POA: Diagnosis present

## 2020-05-23 DIAGNOSIS — F0391 Unspecified dementia with behavioral disturbance: Secondary | ICD-10-CM | POA: Diagnosis present

## 2020-05-23 DIAGNOSIS — F0390 Unspecified dementia without behavioral disturbance: Secondary | ICD-10-CM | POA: Diagnosis not present

## 2020-05-23 DIAGNOSIS — R4020 Unspecified coma: Secondary | ICD-10-CM | POA: Diagnosis not present

## 2020-05-23 DIAGNOSIS — Z20822 Contact with and (suspected) exposure to covid-19: Secondary | ICD-10-CM | POA: Diagnosis not present

## 2020-05-23 DIAGNOSIS — F03918 Unspecified dementia, unspecified severity, with other behavioral disturbance: Secondary | ICD-10-CM | POA: Diagnosis present

## 2020-05-23 DIAGNOSIS — Z79899 Other long term (current) drug therapy: Secondary | ICD-10-CM | POA: Insufficient documentation

## 2020-05-23 DIAGNOSIS — G3109 Other frontotemporal dementia: Secondary | ICD-10-CM | POA: Diagnosis not present

## 2020-05-23 DIAGNOSIS — F05 Delirium due to known physiological condition: Secondary | ICD-10-CM | POA: Diagnosis present

## 2020-05-23 DIAGNOSIS — J9811 Atelectasis: Secondary | ICD-10-CM | POA: Diagnosis not present

## 2020-05-23 DIAGNOSIS — Z743 Need for continuous supervision: Secondary | ICD-10-CM | POA: Diagnosis not present

## 2020-05-23 DIAGNOSIS — R0902 Hypoxemia: Secondary | ICD-10-CM | POA: Diagnosis not present

## 2020-05-23 DIAGNOSIS — R55 Syncope and collapse: Principal | ICD-10-CM | POA: Diagnosis present

## 2020-05-23 DIAGNOSIS — I499 Cardiac arrhythmia, unspecified: Secondary | ICD-10-CM | POA: Diagnosis not present

## 2020-05-23 DIAGNOSIS — Z7982 Long term (current) use of aspirin: Secondary | ICD-10-CM | POA: Diagnosis not present

## 2020-05-23 DIAGNOSIS — R404 Transient alteration of awareness: Secondary | ICD-10-CM | POA: Diagnosis not present

## 2020-05-23 LAB — SARS CORONAVIRUS 2 BY RT PCR (HOSPITAL ORDER, PERFORMED IN ~~LOC~~ HOSPITAL LAB): SARS Coronavirus 2: NEGATIVE

## 2020-05-23 LAB — CBC
HCT: 43.1 % (ref 36.0–46.0)
Hemoglobin: 14 g/dL (ref 12.0–15.0)
MCH: 32.7 pg (ref 26.0–34.0)
MCHC: 32.5 g/dL (ref 30.0–36.0)
MCV: 100.7 fL — ABNORMAL HIGH (ref 80.0–100.0)
Platelets: UNDETERMINED 10*3/uL (ref 150–400)
RBC: 4.28 MIL/uL (ref 3.87–5.11)
RDW: 12.6 % (ref 11.5–15.5)
WBC: 8.1 10*3/uL (ref 4.0–10.5)
nRBC: 0 % (ref 0.0–0.2)

## 2020-05-23 LAB — URINALYSIS, ROUTINE W REFLEX MICROSCOPIC
Bilirubin Urine: NEGATIVE
Glucose, UA: NEGATIVE mg/dL
Hgb urine dipstick: NEGATIVE
Ketones, ur: 5 mg/dL — AB
Leukocytes,Ua: NEGATIVE
Nitrite: NEGATIVE
Protein, ur: NEGATIVE mg/dL
Specific Gravity, Urine: 1.01 (ref 1.005–1.030)
pH: 7 (ref 5.0–8.0)

## 2020-05-23 LAB — BASIC METABOLIC PANEL
Anion gap: 13 (ref 5–15)
BUN: 21 mg/dL (ref 8–23)
CO2: 29 mmol/L (ref 22–32)
Calcium: 9.7 mg/dL (ref 8.9–10.3)
Chloride: 97 mmol/L — ABNORMAL LOW (ref 98–111)
Creatinine, Ser: 0.99 mg/dL (ref 0.44–1.00)
GFR calc Af Amer: 56 mL/min — ABNORMAL LOW (ref >60)
GFR calc non Af Amer: 48 mL/min — ABNORMAL LOW (ref >60)
Glucose, Bld: 94 mg/dL (ref 70–99)
Potassium: 4.1 mmol/L (ref 3.5–5.1)
Sodium: 139 mmol/L (ref 135–145)

## 2020-05-23 LAB — TROPONIN I (HIGH SENSITIVITY): Troponin I (High Sensitivity): 4 ng/L (ref <18)

## 2020-05-23 LAB — CBG MONITORING, ED: Glucose-Capillary: 81 mg/dL (ref 70–99)

## 2020-05-23 MED ORDER — ADULT MULTIVITAMIN W/MINERALS CH
1.0000 | ORAL_TABLET | Freq: Every day | ORAL | Status: DC
  Filled 2020-05-23: qty 1

## 2020-05-23 MED ORDER — QUETIAPINE FUMARATE 50 MG PO TABS
50.0000 mg | ORAL_TABLET | Freq: Every morning | ORAL | Status: DC
  Filled 2020-05-23: qty 1

## 2020-05-23 MED ORDER — QUETIAPINE FUMARATE 25 MG PO TABS: 25.0000 mg | ORAL_TABLET | Freq: Three times a day (TID) | ORAL | Status: DC

## 2020-05-23 MED ORDER — CALCIUM CARBONATE-VITAMIN D 500-200 MG-UNIT PO TABS
1.0000 | ORAL_TABLET | Freq: Every day | ORAL | Status: DC
  Filled 2020-05-23: qty 1

## 2020-05-23 MED ORDER — ENOXAPARIN SODIUM 30 MG/0.3ML ~~LOC~~ SOLN
30.0000 mg | SUBCUTANEOUS | Status: DC
  Filled 2020-05-23: qty 0.3

## 2020-05-23 MED ORDER — SODIUM CHLORIDE 0.9% FLUSH
3.0000 mL | Freq: Two times a day (BID) | INTRAVENOUS | Status: DC
  Administered 2020-05-24: 3 mL via INTRAVENOUS

## 2020-05-23 MED ORDER — ALLOPURINOL 100 MG PO TABS
100.0000 mg | ORAL_TABLET | Freq: Every day | ORAL | Status: DC
  Filled 2020-05-23: qty 1

## 2020-05-23 MED ORDER — MIRTAZAPINE 15 MG PO TABS
15.0000 mg | ORAL_TABLET | Freq: Every day | ORAL | Status: DC
  Administered 2020-05-23: 15 mg via ORAL
  Filled 2020-05-23: qty 1

## 2020-05-23 MED ORDER — ASPIRIN EC 81 MG PO TBEC
81.0000 mg | DELAYED_RELEASE_TABLET | Freq: Every day | ORAL | Status: DC
  Filled 2020-05-23: qty 1

## 2020-05-23 MED ORDER — SODIUM CHLORIDE 0.9% FLUSH
3.0000 mL | Freq: Once | INTRAVENOUS | Status: AC
  Administered 2020-05-23: 3 mL via INTRAVENOUS

## 2020-05-23 MED ORDER — MEMANTINE HCL 5 MG PO TABS
5.0000 mg | ORAL_TABLET | Freq: Every day | ORAL | Status: DC
  Filled 2020-05-23: qty 1

## 2020-05-23 MED ORDER — ACETAMINOPHEN 650 MG RE SUPP: 650.0000 mg | Freq: Four times a day (QID) | RECTAL | Status: DC | PRN

## 2020-05-23 MED ORDER — ONDANSETRON HCL 4 MG/2ML IJ SOLN: 4.0000 mg | Freq: Four times a day (QID) | INTRAMUSCULAR | Status: DC | PRN

## 2020-05-23 MED ORDER — GABAPENTIN 300 MG PO CAPS
300.0000 mg | ORAL_CAPSULE | Freq: Two times a day (BID) | ORAL | Status: DC
  Administered 2020-05-23 – 2020-05-24 (×2): 300 mg via ORAL
  Filled 2020-05-23 (×2): qty 1

## 2020-05-23 MED ORDER — METOPROLOL TARTRATE 25 MG PO TABS
50.0000 mg | ORAL_TABLET | Freq: Two times a day (BID) | ORAL | Status: DC
  Administered 2020-05-23: 50 mg via ORAL
  Filled 2020-05-23 (×2): qty 2

## 2020-05-23 MED ORDER — QUETIAPINE FUMARATE 25 MG PO TABS
25.0000 mg | ORAL_TABLET | Freq: Every day | ORAL | Status: DC
  Administered 2020-05-23: 25 mg via ORAL
  Filled 2020-05-23: qty 1

## 2020-05-23 MED ORDER — HYDROCODONE-ACETAMINOPHEN 5-325 MG PO TABS
1.0000 | ORAL_TABLET | Freq: Three times a day (TID) | ORAL | Status: DC | PRN
  Administered 2020-05-24: 1 via ORAL
  Filled 2020-05-23: qty 1

## 2020-05-23 MED ORDER — ONDANSETRON HCL 4 MG PO TABS: 4.0000 mg | ORAL_TABLET | Freq: Four times a day (QID) | ORAL | Status: DC | PRN

## 2020-05-23 MED ORDER — ACETAMINOPHEN 325 MG PO TABS: 650.0000 mg | ORAL_TABLET | Freq: Four times a day (QID) | ORAL | Status: DC | PRN

## 2020-05-23 MED ORDER — HALOPERIDOL LACTATE 5 MG/ML IJ SOLN: 1.0000 mg | Freq: Four times a day (QID) | INTRAMUSCULAR | Status: DC | PRN

## 2020-05-23 NOTE — ED Triage Notes (Signed)
Pt bib [facility]. Pt at hair salon, staff noticed she slumped over and had cyanotic lips. She was repositioned and lowered to the floor and reportedly unresponsive for ~ 5 minutes. EMS arrived to find her A&O with neg stroke screen and no cyanosis. [facility] noted frequent PAC's with hr 80-100 with 130 being the outlier.

## 2020-05-23 NOTE — H&P (Signed)
History and Physical    [name]:[id number] DOB: Sep 04, 1923 DOA: 05/23/2020  PCP: Abner Greenspan, MD  Patient coming from: Home  I have personally briefly reviewed patient's old medical records in [place]  Chief Complaint: Syncope  HPI: Michelle Gregory is a 86 y.o. female with medical history significant of dementia, HTN.  Pt had syncopal episode while having her hair done today.  Lips turned blue, they were unable to arouse pt for 5 mins.  Pt now in ED and and mental status at baseline.  She is developing some sundowning which is very much baseline, and expected being in a strange place according to daughter.   ED Course: Trop neg, UA neg, CXR with some corase interstitial findings but those are chronic and unchanged for over a year, at-least (actually looks like they were present on CXR back in 2008 too).  EKG: irregular sinus rhythm.   Review of Systems: Limited due to dementia, no specific complaints though.  Past Medical History:  Diagnosis Date  . Allergic rhinitis   . Anxiety   . C. difficile diarrhea 10/2017  . Cancer ([place])   . Depression   . GERD (gastroesophageal reflux disease)   . History of colon cancer   . History of recurrent UTIs   . Hyperlipidemia   . Hypertension   . Osteoarthritis   . Spinal compression fracture ([place])   . Vertigo     Past Surgical History:  Procedure Laterality Date  . APPENDECTOMY    . CATARACT EXTRACTION    . COLON RESECTION    . INTRAMEDULLARY (IM) NAIL INTERTROCHANTERIC Left 06/22/2019   Procedure: INTRAMEDULLARY (IM) NAIL INTERTROCHANTRIC;  Surgeon: Rod Can, MD;  Location: [facility];  Service: Orthopedics;  Laterality: Left;  . TONSILLECTOMY       reports that she has never smoked. She has never used smokeless tobacco. She reports that she does not drink alcohol and does not use drugs.  Allergies  Allergen Reactions  . Amoxicillin-Pot Clavulanate Diarrhea    Has patient had a PCN reaction causing  immediate rash, facial/tongue/throat swelling, SOB or lightheadedness with hypotension: Yes Has patient had a PCN reaction causing severe rash involving mucus membranes or skin necrosis: No Has patient had a PCN reaction that required hospitalization: No Has patient had a PCN reaction occurring within the last 10 years: No If all of the above answers are "NO", then may proceed with Cephalosporin use.   Marland Kitchen Keflex [Cephalexin] Diarrhea  . Septra [Sulfamethoxazole-Trimethoprim] Nausea Only    Family History  Problem Relation Age of Onset  . Prostate cancer Father   . Heart attack Mother   . Gout Mother      Prior to Admission medications   Medication Sig Start Date End Date Taking? Authorizing Provider  allopurinol (ZYLOPRIM) 100 MG tablet TAKE 2 TABLETS BY MOUTH  DAILY Patient taking differently: Take 100 mg by mouth daily.  01/03/20  Yes Tower, [name], MD  Ascorbic Acid (VITAMIN C) 100 MG tablet Take 100 mg by mouth daily.   Yes [provider]  aspirin 81 MG tablet Take 81 mg by mouth daily.     Yes [provider]  calcium-vitamin D (CALCIUM 500 +D) 500 MG tablet Take 1 tablet by mouth daily.    Yes [provider]  CRANBERRY PO Take 1 capsule by mouth 2 (two) times daily.   Yes [provider]  gabapentin (NEURONTIN) 300 MG capsule Take 300 mg by mouth  2 (two) times daily.  09/06/17  Yes [provider]  HYDROcodone-acetaminophen (NORCO/VICODIN) 5-325 MG tablet Take 1 tablet by mouth every 8 (eight) hours as needed for severe pain. Patient taking differently: Take 1 tablet by mouth every 8 (eight) hours as needed for moderate pain or severe pain.  07/07/19  Yes Love, Ivan Anchors, PA-C  metoprolol tartrate (LOPRESSOR) 50 MG tablet TAKE 1 TABLET BY MOUTH  TWICE DAILY Patient taking differently: Take 50 mg by mouth 2 (two) times daily.  10/02/19  Yes Tower, [name], MD  mirtazapine (REMERON) 15 MG tablet Take 1 tablet (15 mg total) by mouth at  bedtime. 07/13/19  Yes Tower, [name], MD  multivitamin [facility]) per tablet Take 1 tablet by mouth daily.     Yes [provider]  Probiotic Product (PROBIOTIC DAILY PO) Take 1 tablet by mouth daily.   Yes [provider]  psyllium (REGULOID) 0.52 g capsule Take 1.56 g by mouth daily.   Yes [provider]  QUEtiapine (SEROQUEL) 25 MG tablet Take 2 tabs by mouth every morning and one pill in the evening Patient taking differently: Take 25 mg by mouth 3 (three) times daily. Take 2 tabs by mouth every morning and one pill in the evening 02/14/20  Yes Tower, [name], MD  memantine (NAMENDA) 5 MG tablet Take 1 tablet (5 mg total) by mouth 2 (two) times daily. Patient taking differently: Take 5 mg by mouth daily.  05/09/20   Abner Greenspan, MD    Physical Exam: Vitals:   05/23/20 1738 05/23/20 1816 05/23/20 1950 05/23/20 2200  BP:   (!) 142/97 (!) 175/93  Pulse:   72 64  Resp:   23 15  Temp: 97.8 F (36.6 C)     TempSrc: Oral     SpO2:   98% 94%  Weight:  52.2 kg    Height:  5\' 4"  (1.626 m)      Constitutional: NAD, calm, comfortable Eyes: PERRL, lids and conjunctivae normal ENMT: Mucous membranes are moist. Posterior pharynx clear of any exudate or lesions.Normal dentition.  Neck: normal, supple, no masses, no thyromegaly Respiratory: clear to auscultation bilaterally, no wheezing, no crackles. Normal respiratory effort. No accessory muscle use.  Cardiovascular: Regular rate and rhythm, no murmurs / rubs / gallops. No extremity edema. 2+ pedal pulses. No carotid bruits.  Abdomen: no tenderness, no masses palpated. No hepatosplenomegaly. Bowel sounds positive.  Musculoskeletal: no clubbing / cyanosis. No joint deformity upper and lower extremities. Good ROM, no contractures. Normal muscle tone.  Skin: no rashes, lesions, ulcers. No induration Neurologic: CN 2-12 grossly intact. Sensation intact, DTR normal. Strength 5/5 in all 4.  Psychiatric: Normal judgment  and insight. Alert and oriented x 3. Normal mood.    Labs on Admission: I have personally reviewed following labs and imaging studies  CBC: Recent Labs  Lab 05/23/20 1830  WBC 8.1  HGB 14.0  HCT 43.1  MCV 100.7*  PLT PLATELET CLUMPS NOTED ON SMEAR, UNABLE TO ESTIMATE   Basic Metabolic Panel: Recent Labs  Lab 05/23/20 1830  NA 139  K 4.1  CL 97*  CO2 29  GLUCOSE 94  BUN 21  CREATININE 0.99  CALCIUM 9.7   GFR: Estimated Creatinine Clearance: 28 mL/min (by C-G formula based on SCr of 0.99 mg/dL). Liver Function Tests: No results for input(s): AST, ALT, ALKPHOS, BILITOT, PROT, ALBUMIN in the last 168 hours. No results for input(s): LIPASE, AMYLASE in the last 168 hours. No results  for input(s): AMMONIA in the last 168 hours. Coagulation Profile: No results for input(s): INR, PROTIME in the last 168 hours. Cardiac Enzymes: No results for input(s): CKTOTAL, CKMB, CKMBINDEX, TROPONINI in the last 168 hours. BNP (last 3 results) No results for input(s): PROBNP in the last 8760 hours. HbA1C: No results for input(s): HGBA1C in the last 72 hours. CBG: Recent Labs  Lab 05/23/20 1835  GLUCAP 81   Lipid Profile: No results for input(s): CHOL, HDL, LDLCALC, TRIG, CHOLHDL, LDLDIRECT in the last 72 hours. Thyroid Function Tests: No results for input(s): TSH, T4TOTAL, FREET4, T3FREE, THYROIDAB in the last 72 hours. Anemia Panel: No results for input(s): VITAMINB12, FOLATE, FERRITIN, TIBC, IRON, RETICCTPCT in the last 72 hours. Urine analysis:    Component Value Date/Time   COLORURINE YELLOW 05/23/2020 2100   APPEARANCEUR CLEAR 05/23/2020 2100   LABSPEC 1.010 05/23/2020 2100   PHURINE 7.0 05/23/2020 2100   GLUCOSEU NEGATIVE 05/23/2020 2100   HGBUR NEGATIVE 05/23/2020 2100   HGBUR trace-lysed 12/12/2010 1509   BILIRUBINUR NEGATIVE 05/23/2020 2100   BILIRUBINUR Negative 04/19/2020 1411   KETONESUR 5 (A) 05/23/2020 2100   PROTEINUR NEGATIVE 05/23/2020 2100   UROBILINOGEN  0.2 04/19/2020 1411   UROBILINOGEN 0.2 12/12/2010 1509   NITRITE NEGATIVE 05/23/2020 2100   LEUKOCYTESUR NEGATIVE 05/23/2020 2100    Radiological Exams on Admission: DG Chest Portable 1 View  Result Date: 05/23/2020 CLINICAL DATA:  Syncope EXAM: PORTABLE CHEST 1 VIEW COMPARISON:  Radiograph 03/19/2019 FINDINGS: Mildly coarsened reticular interstitial changes are similar to the comparison exam with some atelectatic features in both lung bases. No new focal consolidative opacity. Cardiomediastinal contour is unchanged from prior counting for differences in rotation and technique. No pneumothorax or effusion. No acute osseous or soft tissue abnormality. Degenerative changes are present in the imaged spine and shoulders. Telemetry leads overlie the chest. IMPRESSION: Bibasilar atelectasis with coarsened interstitial changes similar to comparison. No other acute cardiopulmonary abnormality. Aortic Atherosclerosis (ICD10-I70.0). Electronically Signed   By: Lovena Le M.D.   On: 05/23/2020 19:22    EKG: Independently reviewed.  Assessment/Plan Principal Problem:   Syncope Active Problems:   Essential hypertension   Dementia with behavioral disturbance (HCC)   Sundowning    1. Syncope - 1. Syncope pathway 2. Tele monitor 3. 2d echo 2. Dementia with sundowning - 1. Cont all home meds 2. If pt still has sundowning issues after home meds (seroquel and Remeron) given, then try low dose halidol if absolutely needed. 3. HTN - 1. Cont metoprolol  DVT prophylaxis: Lovenox Code Status: DNR - confirmed with daughter Family Communication: Spoke with Daughter on phone Disposition Plan: Home after syncope work up, probably Film/video editor called: None Admission status: Place in [place], [place] Hospitalists  How to contact the [facility] Attending or Consulting provider [place] or covering provider during after hours [place], for this patient?  1. Check the care team in [facility] and  look for a) attending/consulting [facility] provider listed and b) the [facility] team listed 2. Log into [email or web address]  Amion Physician Scheduling and messaging for groups and whole hospitals  On call and physician scheduling software for group practices, residents, hospitalists and other medical providers for call, clinic, rotation and shift schedules. OnCall [employer] is a hospital-wide system for scheduling doctors and paging doctors on call. EasyPlot is for scientific plotting and data analysis.  [email or web address]  and use [place]'s universal password to access. If you do not have the password, please  contact the hospital operator.  3. Locate the [facility] provider you are looking for under [facility] and page to a number that you can be directly reached. 4. If you still have difficulty reaching the provider, please page the [facility] (Director on Call) for the Hospitalists listed on amion for assistance.  05/23/2020, 11:15 PM

## 2020-05-23 NOTE — ED Notes (Signed)
Culture sent down with urine.

## 2020-05-23 NOTE — ED Notes (Signed)
MD states he will cancel second troponin due to first being negative

## 2020-05-23 NOTE — ED Provider Notes (Signed)
[facility] EMERGENCY DEPARTMENT Provider Note   CSN: [id number] Arrival date & time: 05/23/20  1720     History Chief Complaint  Patient presents with  . Loss of Consciousness    Michelle Gregory is a 86 y.o. female.  HPI   Pt states she was getting her hair done.    She felt fine.  Apparently while there she slumped over and was unresponsive, lips cyanotic.   Staff tried to stimulate to wake her up and would not respond. Pt was lowered to the ground.  ?loc for 5 minutes.  Pt denies any complaints now.  She feels fine.  No CP or HA.  Past Medical History:  Diagnosis Date  . Allergic rhinitis   . Anxiety   . C. difficile diarrhea 10/2017  . Cancer ([place])   . Depression   . GERD (gastroesophageal reflux disease)   . History of colon cancer   . History of recurrent UTIs   . Hyperlipidemia   . Hypertension   . Osteoarthritis   . Spinal compression fracture ([place])   . Vertigo     Patient Active Problem List   Diagnosis Date Noted  . Pressure ulcer 04/12/2020  . Dementia with behavioral disturbance ([place])   . Acute blood loss as cause of postoperative anemia 06/27/2019  . History of pelvic fracture 06/27/2019  . Closed displaced intertrochanteric fracture of left femur ([place]) 06/21/2019  . Anxiety with depression 06/21/2019  . Diarrhea 03/24/2019  . Shingles 01/23/2019  . Fracture, ulna, distal 12/06/2018  . Thrombocytopenia ([place]) 12/06/2018  . Dementia, senile ([place]) 11/22/2018  . Hand injury, left, initial encounter 09/01/2018  . H/O falling 05/03/2018  . Episodic confusion 05/03/2018  . Fall at home 03/29/2018  . Anxiety disorder 03/29/2018  . Fatigue 03/29/2018  . Chronic pain 02/09/2018  . Cerumen impaction 12/06/2017  . H/O Clostridium difficile infection 10/13/2017  . Recurrent UTI (urinary tract infection) 09/06/2017  . Auditory hallucinations 03/03/2017  . Female cystocele 04/13/2016  . Bowel habit changes 12/30/2015  . Compression fracture  12/30/2015  . Routine general medical examination at a health care facility 04/09/2015  . [place] intertrigo 03/28/2014  . Bilateral hearing loss due to cerumen impaction 01/17/2014  . Encounter for Medicare annual wellness exam 03/21/2013  . Gout 09/19/2012  . Age related osteoporosis 05/05/2011  . Degenerative lumbar disc 01/20/2011  . VARICOSE VEINS, LOWER EXTREMITIES 09/04/2010  . Prediabetes 01/08/2010  . Hyperlipidemia 05/09/2008  . Essential hypertension 05/09/2008  . HEMORRHOIDS, INTERNAL 05/09/2008  . Allergic rhinitis 05/09/2008  . Mild reactive airways disease 05/09/2008  . GERD 05/09/2008  . IBS 05/09/2008  . Osteoarthritis 05/09/2008  . History of malignant neoplasm of large intestine 05/09/2008    Past Surgical History:  Procedure Laterality Date  . APPENDECTOMY    . CATARACT EXTRACTION    . COLON RESECTION    . INTRAMEDULLARY (IM) NAIL INTERTROCHANTERIC Left 06/22/2019   Procedure: INTRAMEDULLARY (IM) NAIL INTERTROCHANTRIC;  Surgeon: Rod Can, MD;  Location: [facility];  Service: Orthopedics;  Laterality: Left;  . TONSILLECTOMY       OB History   No obstetric history on file.     Family History  Problem Relation Age of Onset  . Prostate cancer Father   . Heart attack Mother   . Gout Mother     Social History   Tobacco Use  . Smoking status: Never Smoker  . Smokeless tobacco: Never Used  Substance Use Topics  . Alcohol use:  No    Alcohol/week: 0.0 standard drinks  . Drug use: No    Home Medications Prior to Admission medications   Medication Sig Start Date End Date Taking? Authorizing Provider  allopurinol (ZYLOPRIM) 100 MG tablet TAKE 2 TABLETS BY MOUTH  DAILY Patient taking differently: Take 100 mg by mouth daily.  01/03/20  Yes Tower, [name], MD  Ascorbic Acid (VITAMIN C) 100 MG tablet Take 100 mg by mouth daily.   Yes [provider]  aspirin 81 MG tablet Take 81 mg by mouth daily.     Yes [provider]    calcium-vitamin D (CALCIUM 500 +D) 500 MG tablet Take 1 tablet by mouth daily.    Yes [provider]  CRANBERRY PO Take 1 capsule by mouth 2 (two) times daily.   Yes [provider]  gabapentin (NEURONTIN) 300 MG capsule Take 300 mg by mouth 2 (two) times daily.  09/06/17  Yes [provider]  HYDROcodone-acetaminophen (NORCO/VICODIN) 5-325 MG tablet Take 1 tablet by mouth every 8 (eight) hours as needed for severe pain. Patient taking differently: Take 1 tablet by mouth every 8 (eight) hours as needed for moderate pain or severe pain.  07/07/19  Yes Love, Ivan Anchors, PA-C  memantine (NAMENDA) 5 MG tablet Take 1 tablet (5 mg total) by mouth 2 (two) times daily. Patient taking differently: Take 5 mg by mouth daily.  05/09/20  Yes Tower, [name], MD  metoprolol tartrate (LOPRESSOR) 50 MG tablet TAKE 1 TABLET BY MOUTH  TWICE DAILY Patient taking differently: Take 50 mg by mouth 2 (two) times daily.  10/02/19  Yes Tower, [name], MD  mirtazapine (REMERON) 15 MG tablet Take 1 tablet (15 mg total) by mouth at bedtime. 07/13/19  Yes Tower, [name], MD  multivitamin [employer]) per tablet Take 1 tablet by mouth daily.     Yes [provider]  Probiotic Product (PROBIOTIC DAILY PO) Take 1 tablet by mouth daily.   Yes [provider]  psyllium (REGULOID) 0.52 g capsule Take 1.56 g by mouth daily.   Yes [provider]  QUEtiapine (SEROQUEL) 25 MG tablet Take 2 tabs by mouth every morning and one pill in the evening Patient taking differently: Take 25 mg by mouth 3 (three) times daily. Take 2 tabs by mouth every morning and one pill in the evening 02/14/20  Yes Tower, [name], MD  acetaminophen (TYLENOL) 325 MG tablet Take 1-2 tablets (325-650 mg total) by mouth every 4 (four) hours as needed for mild pain. Patient not taking: Reported on 05/23/2020 07/07/19   Love, Ivan Anchors, PA-C  psyllium (HYDROCIL/METAMUCIL) 95 % PACK Take 1 packet by mouth daily. Patient not  taking: Reported on 05/23/2020 07/08/19   Bary Leriche, PA-C    Allergies    Amoxicillin-pot clavulanate, Keflex [cephalexin], and Septra [sulfamethoxazole-trimethoprim]  Review of Systems   Review of Systems  Respiratory: Negative for cough.   Cardiovascular: Negative for chest pain.  Gastrointestinal: Negative for abdominal pain.  Genitourinary: Negative for dysuria.  Neurological: Negative for headaches.  All other systems reviewed and are negative.   Physical Exam Updated Vital Signs BP (!) 175/93   Pulse 64   Temp 97.8 F (36.6 C) (Oral)   Resp 15   Ht 1.626 m (5\' 4" )   Wt 52.2 kg   LMP 11/02/1980   SpO2 94%   BMI 19.74 kg/m   Physical Exam Vitals and nursing note reviewed.  Constitutional:  General: She is not in acute distress.    Appearance: She is well-developed.  HENT:     Head: Normocephalic and atraumatic.     Right Ear: External ear normal.     Left Ear: External ear normal.  Eyes:     General: No scleral icterus.       Right eye: No discharge.        Left eye: No discharge.     Conjunctiva/sclera: Conjunctivae normal.  Neck:     Trachea: No tracheal deviation.  Cardiovascular:     Rate and Rhythm: Normal rate and regular rhythm.  Pulmonary:     Effort: Pulmonary effort is normal. No respiratory distress.     Breath sounds: Normal breath sounds. No stridor. No wheezing or rales.  Abdominal:     General: Bowel sounds are normal. There is no distension.     Palpations: Abdomen is soft.     Tenderness: There is no abdominal tenderness. There is no guarding or rebound.  Musculoskeletal:        General: No tenderness.     Cervical back: Neck supple.  Skin:    General: Skin is warm and dry.     Findings: No rash.  Neurological:     Mental Status: She is alert and oriented to person, place, and time.     Cranial Nerves: No cranial nerve deficit (No facial droop, extraocular movements intact, tongue midline ).     Sensory: No sensory deficit.      Motor: No abnormal muscle tone or seizure activity.     Coordination: Coordination normal.     Comments: No pronator drift bilateral upper extrem, able to hold both legs off bed for 5 seconds, sensation intact in all extremities, no visual field cuts, no left or right sided neglect,   no nystagmus noted      ED Results / Procedures / Treatments   Labs (all labs ordered are listed, but only abnormal results are displayed) Labs Reviewed  BASIC METABOLIC PANEL - Abnormal; Notable for the following components:      Result Value   Chloride 97 (*)    GFR calc non Af Amer 48 (*)    GFR calc Af Amer 56 (*)    All other components within normal limits  CBC - Abnormal; Notable for the following components:   MCV 100.7 (*)    All other components within normal limits  URINALYSIS, ROUTINE W REFLEX MICROSCOPIC - Abnormal; Notable for the following components:   Ketones, ur 5 (*)    All other components within normal limits  SARS CORONAVIRUS 2 BY RT PCR (HOSPITAL ORDER, [facility])  CBG MONITORING, ED  TROPONIN I (HIGH SENSITIVITY)    EKG EKG Interpretation  Date/Time:  Thursday May 23 2020 17:29:48 EDT Ventricular Rate:  94 PR Interval:    QRS Duration: 79 QT Interval:  367 QTC Calculation: 421 R Axis:   72 Text Interpretation: Sinus rhythm Multiple premature complexes, vent & supraven , new since last tracing Confirmed by Dorie Rank [phone]) on 05/23/2020 5:42:41 PM   Radiology DG Chest Portable 1 View  Result Date: 05/23/2020 CLINICAL DATA:  Syncope EXAM: PORTABLE CHEST 1 VIEW COMPARISON:  Radiograph 03/19/2019 FINDINGS: Mildly coarsened reticular interstitial changes are similar to the comparison exam with some atelectatic features in both lung bases. No new focal consolidative opacity. Cardiomediastinal contour is unchanged from prior counting for differences in rotation and technique. No pneumothorax or effusion. No  acute osseous or soft tissue  abnormality. Degenerative changes are present in the imaged spine and shoulders. Telemetry leads overlie the chest. IMPRESSION: Bibasilar atelectasis with coarsened interstitial changes similar to comparison. No other acute cardiopulmonary abnormality. Aortic Atherosclerosis (ICD10-I70.0). Electronically Signed   By: Lovena Le M.D.   On: 05/23/2020 19:22    Procedures Procedures (including critical care time)  Medications Ordered in ED Medications  sodium chloride flush (NS) 0.9 % injection 3 mL (3 mLs Intravenous Given 05/23/20 1918)    ED Course  I have reviewed the triage vital signs and the nursing notes.  Pertinent labs & imaging results that were available during my care of the patient were reviewed by me and considered in my medical decision making (see chart for details).  Clinical Course as of May 23 2236  Thu May 23, 2020  2053 Covid test is negative.  Electrolyte panel is unremarkable.  CBC is normal.   [JK]  2053   Initial troponin is normal   [JK]  2053 Chest x-ray without acute findings i   [JK]    Clinical Course User Index [JK] Dorie Rank, MD   MDM Rules/Calculators/A&P                          Patient presented to ED for evaluation after a syncopal episode.  Patient currently is asymptomatic.  She denies any chest pain or shortness of breath.  No dysrhythmia noted on initial EKG.  Etiology of her syncopal episode is unclear.  Discussed findings with patient and family.  They are agreeable to overnight observation for cardiac monitoring. Final Clinical Impression(s) / ED Diagnoses Final diagnoses:  Syncope and collapse      Dorie Rank, MD 05/23/20 2238

## 2020-05-24 ENCOUNTER — Other Ambulatory Visit (HOSPITAL_COMMUNITY): Payer: MEDICARE

## 2020-05-24 DIAGNOSIS — R55 Syncope and collapse: Secondary | ICD-10-CM | POA: Diagnosis not present

## 2020-05-24 NOTE — ED Notes (Signed)
Pt given dc instructions pt and daughter verbalize understanding. Pt wheeled out to lobby.

## 2020-05-24 NOTE — ED Notes (Signed)
CBG 94 

## 2020-05-24 NOTE — Discharge Summary (Signed)
Physician Discharge Summary  Michelle Gregory KGU:[id number] DOB: 1923-04-27 DOA: 05/23/2020  PCP: Abner Greenspan, MD  Admit date: 05/23/2020 Discharge date: 05/24/2020  Time spent: 45* minutes  Recommendations for Outpatient Follow-up:  1. Follow-up PCP in 1 week 2. Patient will need echocardiogram as outpatient  Discharge Diagnoses:  Principal Problem:   Syncope Active Problems:   Essential hypertension   Dementia with behavioral disturbance ([place])   Sundowning   Discharge Condition: Stable  Diet recommendation: Heart healthy diet  Filed Weights   05/23/20 1816  Weight: 52.2 kg    History of present illness:  86 year old female with medical history of dementia, hypertension presents with syncopal episode while she was having her hair done at hair salon.  Patient slipped on glue and unable to arouse for 5 minutes.  Patient was admitted for syncope work-up.  Hospital Course:   Syncope-likely vasovagal versus orthostatic hypotension.  Patient blood pressure dropped from 628- 315 systolic from laying down to sitting up position.  Will order TED hose.  Patient does not want to wait for echocardiogram.  Patient's daughter states that she will take her to PCP on Monday and get echocardiogram as outpatient.  Cardiac enzymes were negative.  EKG was unremarkable.  Patient did not have any further episodes of syncope while in the hospital.  At this time we will discharge patient home.  She will continue taking her home medications.  Dementia-continue home medications  Hypertension-continue metoprolol  Procedures:    Consultations:    Discharge Exam: Vitals:   05/24/20 0600 05/24/20 0655  BP: (!) 148/109 (!) 148/109  Pulse:  98  Resp: 14 20  Temp:    SpO2:  96%    General: Appears in no acute distress Cardiovascular: S1-S2, regular, no murmur auscultated Respiratory: Clear to auscultation bilaterally, no wheezing or crackles auscultated  Discharge  Instructions   Discharge Instructions    Diet - low sodium heart healthy   Complete by: As directed    Increase activity slowly   Complete by: As directed      Allergies as of 05/24/2020      Reactions   Amoxicillin-pot Clavulanate Diarrhea   Has patient had a PCN reaction causing immediate rash, facial/tongue/throat swelling, SOB or lightheadedness with hypotension: Yes Has patient had a PCN reaction causing severe rash involving mucus membranes or skin necrosis: No Has patient had a PCN reaction that required hospitalization: No Has patient had a PCN reaction occurring within the last 10 years: No If all of the above answers are "NO", then may proceed with Cephalosporin use.   Keflex [cephalexin] Diarrhea   Septra [sulfamethoxazole-trimethoprim] Nausea Only      Medication List    TAKE these medications   allopurinol 100 MG tablet Commonly known as: ZYLOPRIM TAKE 2 TABLETS BY MOUTH  DAILY What changed: how much to take   aspirin 81 MG tablet Take 81 mg by mouth daily.   calcium-vitamin D 500 MG tablet Take 1 tablet by mouth daily.   CRANBERRY PO Take 1 capsule by mouth 2 (two) times daily.   gabapentin 300 MG capsule Commonly known as: NEURONTIN Take 300 mg by mouth 2 (two) times daily.   HYDROcodone-acetaminophen 5-325 MG tablet Commonly known as: NORCO/VICODIN Take 1 tablet by mouth every 8 (eight) hours as needed for severe pain. What changed: reasons to take this   memantine 5 MG tablet Commonly known as: Namenda Take 1 tablet (5 mg total) by mouth 2 (two) times daily. What changed:  when to take this   metoprolol tartrate 50 MG tablet Commonly known as: LOPRESSOR TAKE 1 TABLET BY MOUTH  TWICE DAILY   mirtazapine 15 MG tablet Commonly known as: Remeron Take 1 tablet (15 mg total) by mouth at bedtime.   multivitamin per tablet Take 1 tablet by mouth daily.   PROBIOTIC DAILY PO Take 1 tablet by mouth daily.   psyllium 0.52 g capsule Commonly known  as: REGULOID Take 1.56 g by mouth daily.   QUEtiapine 25 MG tablet Commonly known as: SEROquel Take 2 tabs by mouth every morning and one pill in the evening What changed:   how much to take  how to take this  when to take this   vitamin C 100 MG tablet Take 100 mg by mouth daily.      Allergies  Allergen Reactions  . Amoxicillin-Pot Clavulanate Diarrhea    Has patient had a PCN reaction causing immediate rash, facial/tongue/throat swelling, SOB or lightheadedness with hypotension: Yes Has patient had a PCN reaction causing severe rash involving mucus membranes or skin necrosis: No Has patient had a PCN reaction that required hospitalization: No Has patient had a PCN reaction occurring within the last 10 years: No If all of the above answers are "NO", then may proceed with Cephalosporin use.   Marland Kitchen Keflex [Cephalexin] Diarrhea  . Septra [Sulfamethoxazole-Trimethoprim] Nausea Only      The results of significant diagnostics from this hospitalization (including imaging, microbiology, ancillary and laboratory) are listed below for reference.    Significant Diagnostic Studies: DG Chest Portable 1 View  Result Date: 05/23/2020 CLINICAL DATA:  Syncope EXAM: PORTABLE CHEST 1 VIEW COMPARISON:  Radiograph 03/19/2019 FINDINGS: Mildly coarsened reticular interstitial changes are similar to the comparison exam with some atelectatic features in both lung bases. No new focal consolidative opacity. Cardiomediastinal contour is unchanged from prior counting for differences in rotation and technique. No pneumothorax or effusion. No acute osseous or soft tissue abnormality. Degenerative changes are present in the imaged spine and shoulders. Telemetry leads overlie the chest. IMPRESSION: Bibasilar atelectasis with coarsened interstitial changes similar to comparison. No other acute cardiopulmonary abnormality. Aortic Atherosclerosis (ICD10-I70.0). Electronically Signed   By: Lovena Le M.D.   On:  05/23/2020 19:22    Microbiology: Recent Results (from the past 240 hour(s))  SARS Coronavirus 2 by RT PCR (hospital order, performed in [facility] hospital lab) Nasopharyngeal Nasopharyngeal Swab     Status: None   Collection Time: 05/23/20  6:40 PM   Specimen: Nasopharyngeal Swab  Result Value Ref Range Status   SARS Coronavirus 2 NEGATIVE NEGATIVE Final    Comment: (NOTE) SARS-CoV-2 target nucleic acids are NOT DETECTED.  The SARS-CoV-2 RNA is generally detectable in upper and lower respiratory specimens during the acute phase of infection. The lowest concentration of SARS-CoV-2 viral copies this assay can detect is 250 copies / mL. A negative result does not preclude SARS-CoV-2 infection and should not be used as the sole basis for treatment or other patient management decisions.  A negative result may occur with improper specimen collection / handling, submission of specimen other than nasopharyngeal swab, presence of viral mutation(s) within the areas targeted by this assay, and inadequate number of viral copies (<250 copies / mL). A negative result must be combined with clinical observations, patient history, and epidemiological information.  Fact Sheet for Patients:   StrictlyIdeas.no  Fact Sheet for Healthcare Providers: BankingDealers.co.za  This test is not yet approved or  cleared by the [place]  FDA and has been authorized for detection and/or diagnosis of SARS-CoV-2 by FDA under an Emergency Use Authorization (EUA).  This EUA will remain in effect (meaning this test can be used) for the duration of the COVID-19 declaration under Section 564(b)(1) of the Act, 21 U.S.C. section 360bbb-3(b)(1), unless the authorization is terminated or revoked sooner.  Performed at [facility], [place] [address], [place], [place] [address]      Labs: Basic Metabolic Panel: Recent Labs  Lab 05/23/20 1830  NA 139  K 4.1   CL 97*  CO2 29  GLUCOSE 94  BUN 21  CREATININE 0.99  CALCIUM 9.7   Liver Function Tests: No results for input(s): AST, ALT, ALKPHOS, BILITOT, PROT, ALBUMIN in the last 168 hours. No results for input(s): LIPASE, AMYLASE in the last 168 hours. No results for input(s): AMMONIA in the last 168 hours. CBC: Recent Labs  Lab 05/23/20 1830  WBC 8.1  HGB 14.0  HCT 43.1  MCV 100.7*  PLT PLATELET CLUMPS NOTED ON SMEAR, UNABLE TO ESTIMATE    CBG: Recent Labs  Lab 05/23/20 1835  GLUCAP 81       Signed:  Oswald Hillock MD.  [facility] 05/24/2020, 9:23 AM

## 2020-05-24 NOTE — ED Notes (Signed)
Tele  Breakfast Ordered 

## 2020-05-24 NOTE — Discharge Instructions (Signed)
Syncope  Syncope refers to a condition in which a person temporarily loses consciousness. Syncope may also be called fainting or passing out. It is caused by a sudden decrease in blood flow to the brain. Even though most causes of syncope are not dangerous, syncope can be a sign of a serious medical problem. Your health care provider may do tests to find the reason why you are having syncope. Signs that you may be about to faint include:  Feeling dizzy or light-headed.  Feeling nauseous.  Seeing all white or all black in your field of vision.  Having cold, clammy skin. If you faint, get medical help right away. Call your local emergency services (911 in the [place] Do not drive yourself to the hospital. Follow these instructions at home: Pay attention to any changes in your symptoms. Take these actions to stay safe and to help relieve your symptoms: Lifestyle  Do not drive, use machinery, or play sports until your health care provider says it is okay.  Do not drink alcohol.  Do not use any products that contain nicotine or tobacco, such as cigarettes and e-cigarettes. If you need help quitting, ask your health care provider.  Drink enough fluid to keep your urine pale yellow. General instructions  Take over-the-counter and prescription medicines only as told by your health care provider.  If you are taking blood pressure or heart medicine, get up slowly and take several minutes to sit and then stand. This can reduce dizziness or light-headedness.  Have someone stay with you until you feel stable.  If you start to feel like you might faint, lie down right away and raise (elevate) your feet above the level of your heart. Breathe deeply and steadily. Wait until all the symptoms have passed.  Keep all follow-up visits as told by your health care provider. This is important. Get help right away if you:  Have a severe headache.  Faint once or repeatedly.  Have pain in your  chest, abdomen, or back.  Have a very fast or irregular heartbeat (palpitations).  Have pain when you breathe.  Are bleeding from your mouth or rectum, or you have black or tarry stool.  Have a seizure.  Are confused.  Have trouble walking.  Have severe weakness.  Have vision problems. These symptoms may represent a serious problem that is an emergency. Do not wait to see if your symptoms will go away. Get medical help right away. Call your local emergency services (911 in the [place] Do not drive yourself to the hospital. Summary  Syncope refers to a condition in which a person temporarily loses consciousness. It is caused by a sudden decrease in blood flow to the brain.  Signs that you may be about to faint include dizziness, feeling light-headed, feeling nauseous, sudden vision changes, or cold, clammy skin.  Although most causes of syncope are not dangerous, syncope can be a sign of a serious medical problem. If you faint, get medical help right away. This information is not intended to replace advice given to you by your health care provider. Make sure you discuss any questions you have with your health care provider. Document Revised: 10/01/2017 Document Reviewed: 09/27/2017 Elsevier Patient Education  [place].   Syncope  Syncope refers to a condition in which a person temporarily loses consciousness. Syncope may also be called fainting or passing out. It is caused by a sudden decrease in blood flow to the brain. Even though most causes of syncope  are not dangerous, syncope can be a sign of a serious medical problem. Your health care provider may do tests to find the reason why you are having syncope. Signs that you may be about to faint include:  Feeling dizzy or light-headed.  Feeling nauseous.  Seeing all white or all black in your field of vision.  Having cold, clammy skin. If you faint, get medical help right away. Call your local emergency services (911  in the [place] Do not drive yourself to the hospital. Follow these instructions at home: Pay attention to any changes in your symptoms. Take these actions to stay safe and to help relieve your symptoms: Lifestyle  Do not drive, use machinery, or play sports until your health care provider says it is okay.  Do not drink alcohol.  Do not use any products that contain nicotine or tobacco, such as cigarettes and e-cigarettes. If you need help quitting, ask your health care provider.  Drink enough fluid to keep your urine pale yellow. General instructions  Take over-the-counter and prescription medicines only as told by your health care provider.  If you are taking blood pressure or heart medicine, get up slowly and take several minutes to sit and then stand. This can reduce dizziness or light-headedness.  Have someone stay with you until you feel stable.  If you start to feel like you might faint, lie down right away and raise (elevate) your feet above the level of your heart. Breathe deeply and steadily. Wait until all the symptoms have passed.  Keep all follow-up visits as told by your health care provider. This is important. Get help right away if you:  Have a severe headache.  Faint once or repeatedly.  Have pain in your chest, abdomen, or back.  Have a very fast or irregular heartbeat (palpitations).  Have pain when you breathe.  Are bleeding from your mouth or rectum, or you have black or tarry stool.  Have a seizure.  Are confused.  Have trouble walking.  Have severe weakness.  Have vision problems. These symptoms may represent a serious problem that is an emergency. Do not wait to see if your symptoms will go away. Get medical help right away. Call your local emergency services (911 in the [place] Do not drive yourself to the hospital. Summary  Syncope refers to a condition in which a person temporarily loses consciousness. It is caused by a sudden decrease in blood  flow to the brain.  Signs that you may be about to faint include dizziness, feeling light-headed, feeling nauseous, sudden vision changes, or cold, clammy skin.  Although most causes of syncope are not dangerous, syncope can be a sign of a serious medical problem. If you faint, get medical help right away. This information is not intended to replace advice given to you by your health care provider. Make sure you discuss any questions you have with your health care provider. Document Revised: 10/01/2017 Document Reviewed: 09/27/2017 Elsevier Patient Education  [place].   Syncope Syncope is when you pass out (faint) for a short time. It is caused by a sudden decrease in blood flow to the brain. Signs that you may be about to pass out include:  Feeling dizzy or light-headed.  Feeling sick to your stomach (nauseous).  Seeing all white or all black.  Having cold, clammy skin. If you pass out, get help right away. Call your local emergency services (911 in the [place] Do not drive yourself to the hospital. Follow  these instructions at home: Watch for any changes in your symptoms. Take these actions to stay safe and help with your symptoms: Lifestyle  Do not drive, use machinery, or play sports until your doctor says it is okay.  Do not drink alcohol.  Do not use any products that contain nicotine or tobacco, such as cigarettes and e-cigarettes. If you need help quitting, ask your doctor.  Drink enough fluid to keep your pee (urine) pale yellow. General instructions  Take over-the-counter and prescription medicines only as told by your doctor.  If you are taking blood pressure or heart medicine, sit up and stand up slowly. Spend a few minutes getting ready to sit and then stand. This can help you feel less dizzy.  Have someone stay with you until you feel stable.  If you start to feel like you might pass out, lie down right away and raise (elevate) your feet above the level  of your heart. Breathe deeply and steadily. Wait until all of the symptoms are gone.  Keep all follow-up visits as told by your doctor. This is important. Get help right away if:  You have a very bad headache.  You pass out once or more than once.  You have pain in your chest, belly, or back.  You have a very fast or uneven heartbeat (palpitations).  It hurts to breathe.  You are bleeding from your mouth or your bottom (rectum).  You have black or tarry poop (stool).  You have jerky movements that you cannot control (seizure).  You are confused.  You have trouble walking.  You are very weak.  You have vision problems. These symptoms may be an emergency. Do not wait to see if the symptoms will go away. Get medical help right away. Call your local emergency services (911 in the [place] Do not drive yourself to the hospital. Summary  Syncope is when you pass out (faint) for a short time. It is caused by a sudden decrease in blood flow to the brain.  Signs that you may be about to faint include feeling dizzy, light-headed, or sick to your stomach, seeing all white or all black, or having cold, clammy skin.  If you start to feel like you might pass out, lie down right away and raise (elevate) your feet above the level of your heart. Breathe deeply and steadily. Wait until all of the symptoms are gone. This information is not intended to replace advice given to you by your health care provider. Make sure you discuss any questions you have with your health care provider. Document Revised: 12/01/2017 Document Reviewed: 12/01/2017 Elsevier Patient Education  [place]. Syncope Syncope is when you pass out (faint) for a short time. It is caused by a sudden decrease in blood flow to the brain. Signs that you may be about to pass out include:  Feeling dizzy or light-headed.  Feeling sick to your stomach (nauseous).  Seeing all white or all black.  Having cold, clammy  skin. If you pass out, get help right away. Call your local emergency services (911 in the [place] Do not drive yourself to the hospital. Follow these instructions at home: Watch for any changes in your symptoms. Take these actions to stay safe and help with your symptoms: Lifestyle  Do not drive, use machinery, or play sports until your doctor says it is okay.  Do not drink alcohol.  Do not use any products that contain nicotine or tobacco, such as cigarettes and  e-cigarettes. If you need help quitting, ask your doctor.  Drink enough fluid to keep your pee (urine) pale yellow. General instructions  Take over-the-counter and prescription medicines only as told by your doctor.  If you are taking blood pressure or heart medicine, sit up and stand up slowly. Spend a few minutes getting ready to sit and then stand. This can help you feel less dizzy.  Have someone stay with you until you feel stable.  If you start to feel like you might pass out, lie down right away and raise (elevate) your feet above the level of your heart. Breathe deeply and steadily. Wait until all of the symptoms are gone.  Keep all follow-up visits as told by your doctor. This is important. Get help right away if:  You have a very bad headache.  You pass out once or more than once.  You have pain in your chest, belly, or back.  You have a very fast or uneven heartbeat (palpitations).  It hurts to breathe.  You are bleeding from your mouth or your bottom (rectum).  You have black or tarry poop (stool).  You have jerky movements that you cannot control (seizure).  You are confused.  You have trouble walking.  You are very weak.  You have vision problems. These symptoms may be an emergency. Do not wait to see if the symptoms will go away. Get medical help right away. Call your local emergency services (911 in the [place] Do not drive yourself to the hospital. Summary  Syncope is when you pass out  (faint) for a short time. It is caused by a sudden decrease in blood flow to the brain.  Signs that you may be about to faint include feeling dizzy, light-headed, or sick to your stomach, seeing all white or all black, or having cold, clammy skin.  If you start to feel like you might pass out, lie down right away and raise (elevate) your feet above the level of your heart. Breathe deeply and steadily. Wait until all of the symptoms are gone. This information is not intended to replace advice given to you by your health care provider. Make sure you discuss any questions you have with your health care provider. Document Revised: 12/01/2017 Document Reviewed: 12/01/2017 Elsevier Patient Education  [place].   Syncope  Syncope refers to a condition in which a person temporarily loses consciousness. Syncope may also be called fainting or passing out. It is caused by a sudden decrease in blood flow to the brain. Even though most causes of syncope are not dangerous, syncope can be a sign of a serious medical problem. Your health care provider may do tests to find the reason why you are having syncope. Signs that you may be about to faint include:  Feeling dizzy or light-headed.  Feeling nauseous.  Seeing all white or all black in your field of vision.  Having cold, clammy skin. If you faint, get medical help right away. Call your local emergency services (911 in the [place] Do not drive yourself to the hospital. Follow these instructions at home: Pay attention to any changes in your symptoms. Take these actions to stay safe and to help relieve your symptoms: Lifestyle  Do not drive, use machinery, or play sports until your health care provider says it is okay.  Do not drink alcohol.  Do not use any products that contain nicotine or tobacco, such as cigarettes and e-cigarettes. If you need help quitting, ask your  health care provider.  Drink enough fluid to keep your urine pale  yellow. General instructions  Take over-the-counter and prescription medicines only as told by your health care provider.  If you are taking blood pressure or heart medicine, get up slowly and take several minutes to sit and then stand. This can reduce dizziness or light-headedness.  Have someone stay with you until you feel stable.  If you start to feel like you might faint, lie down right away and raise (elevate) your feet above the level of your heart. Breathe deeply and steadily. Wait until all the symptoms have passed.  Keep all follow-up visits as told by your health care provider. This is important. Get help right away if you:  Have a severe headache.  Faint once or repeatedly.  Have pain in your chest, abdomen, or back.  Have a very fast or irregular heartbeat (palpitations).  Have pain when you breathe.  Are bleeding from your mouth or rectum, or you have black or tarry stool.  Have a seizure.  Are confused.  Have trouble walking.  Have severe weakness.  Have vision problems. These symptoms may represent a serious problem that is an emergency. Do not wait to see if your symptoms will go away. Get medical help right away. Call your local emergency services (911 in the [place] Do not drive yourself to the hospital. Summary  Syncope refers to a condition in which a person temporarily loses consciousness. It is caused by a sudden decrease in blood flow to the brain.  Signs that you may be about to faint include dizziness, feeling light-headed, feeling nauseous, sudden vision changes, or cold, clammy skin.  Although most causes of syncope are not dangerous, syncope can be a sign of a serious medical problem. If you faint, get medical help right away. This information is not intended to replace advice given to you by your health care provider. Make sure you discuss any questions you have with your health care provider. Document Revised: 10/01/2017 Document Reviewed:  09/27/2017 Elsevier Patient Education  2020 [employer].

## 2020-05-27 ENCOUNTER — Other Ambulatory Visit: Payer: Self-pay | Admitting: *Deleted

## 2020-05-27 ENCOUNTER — Ambulatory Visit (INDEPENDENT_AMBULATORY_CARE_PROVIDER_SITE_OTHER): Payer: MEDICARE | Admitting: Family Medicine

## 2020-05-27 ENCOUNTER — Other Ambulatory Visit: Payer: Self-pay

## 2020-05-27 ENCOUNTER — Encounter: Payer: Self-pay | Admitting: Family Medicine

## 2020-05-27 VITALS — BP 112/70 | HR 87 | Temp 97.1°F | Ht 60.5 in | Wt 106.3 lb

## 2020-05-27 DIAGNOSIS — R55 Syncope and collapse: Secondary | ICD-10-CM

## 2020-05-27 DIAGNOSIS — I10 Essential (primary) hypertension: Secondary | ICD-10-CM

## 2020-05-27 DIAGNOSIS — F0281 Dementia in other diseases classified elsewhere with behavioral disturbance: Secondary | ICD-10-CM | POA: Diagnosis not present

## 2020-05-27 DIAGNOSIS — G3109 Other frontotemporal dementia: Secondary | ICD-10-CM

## 2020-05-27 DIAGNOSIS — F02818 Dementia in other diseases classified elsewhere, unspecified severity, with other behavioral disturbance: Secondary | ICD-10-CM

## 2020-05-27 NOTE — Progress Notes (Signed)
Subjective:    Patient ID: Michelle Gregory, female    DOB: Nov 30, 1922, 86 y.o.   MRN: [id number]  This visit occurred during the SARS-CoV-2 public health emergency.  Safety protocols were in place, including screening questions prior to the visit, additional usage of staff PPE, and extensive cleaning of exam room while observing appropriate contact time as indicated for disinfecting solutions.    HPI Pt presents for ER follow up (7/22)  Pt had episode of syncope while getting her hair done  Slumped over/unresponsive  Loc 5 min -lowered to the ground Recommended overnt [place] [facility] Course:   Syncope-likely vasovagal versus orthostatic hypotension.  Patient blood pressure dropped from 315- 400 systolic from laying down to sitting up position.  Will order TED hose.  Patient does not want to wait for echocardiogram.  Patient's daughter states that she will take her to PCP on Monday and get echocardiogram as outpatient.  Cardiac enzymes were negative.  EKG was unremarkable.  Patient did not have any further episodes of syncope while in the hospital.  At this time we will discharge patient home.  She will continue taking her home medications.  Results for orders placed or performed during the hospital encounter of 05/23/20  SARS Coronavirus 2 by RT PCR (hospital order, performed in [place] hospital lab) Nasopharyngeal Nasopharyngeal Swab   Specimen: Nasopharyngeal Swab  Result Value Ref Range   SARS Coronavirus 2 NEGATIVE NEGATIVE  Basic metabolic panel  Result Value Ref Range   Sodium 139 135 - 145 mmol/L   Potassium 4.1 3.5 - 5.1 mmol/L   Chloride 97 (L) 98 - 111 mmol/L   CO2 29 22 - 32 mmol/L   Glucose, Bld 94 70 - 99 mg/dL   BUN 21 8 - 23 mg/dL   Creatinine, Ser 0.99 0.44 - 1.00 mg/dL   Calcium 9.7 8.9 - 10.3 mg/dL   GFR calc non Af Amer 48 (L) >60 mL/min   GFR calc Af Amer 56 (L) >60 mL/min   Anion gap 13 5 - 15  CBC  Result Value Ref Range   WBC 8.1  4.0 - 10.5 K/uL   RBC 4.28 3.87 - 5.11 MIL/uL   Hemoglobin 14.0 12.0 - 15.0 g/dL   HCT 43.1 36 - 46 %   MCV 100.7 (H) 80.0 - 100.0 fL   MCH 32.7 26.0 - 34.0 pg   MCHC 32.5 30.0 - 36.0 g/dL   RDW 12.6 11.5 - 15.5 %   Platelets PLATELET CLUMPS NOTED ON SMEAR, UNABLE TO ESTIMATE 150 - 400 K/uL   nRBC 0.0 0.0 - 0.2 %  Urinalysis, Routine w reflex microscopic  Result Value Ref Range   Color, Urine YELLOW YELLOW   APPearance CLEAR CLEAR   Specific Gravity, Urine 1.010 1.005 - 1.030   pH 7.0 5.0 - 8.0   Glucose, UA NEGATIVE NEGATIVE mg/dL   Hgb urine dipstick NEGATIVE NEGATIVE   Bilirubin Urine NEGATIVE NEGATIVE   Ketones, ur 5 (A) NEGATIVE mg/dL   Protein, ur NEGATIVE NEGATIVE mg/dL   Nitrite NEGATIVE NEGATIVE   Leukocytes,Ua NEGATIVE NEGATIVE  CBG monitoring, ED  Result Value Ref Range   Glucose-Capillary 81 70 - 99 mg/dL  Troponin I (High Sensitivity)  Result Value Ref Range   Troponin I (High Sensitivity) 4 <18 ng/L      DG Chest Portable 1 View  Result Date: 05/23/2020 CLINICAL DATA:  Syncope EXAM: PORTABLE CHEST 1 VIEW COMPARISON:  Radiograph 03/19/2019 FINDINGS:  Mildly coarsened reticular interstitial changes are similar to the comparison exam with some atelectatic features in both lung bases. No new focal consolidative opacity. Cardiomediastinal contour is unchanged from prior counting for differences in rotation and technique. No pneumothorax or effusion. No acute osseous or soft tissue abnormality. Degenerative changes are present in the imaged spine and shoulders. Telemetry leads overlie the chest. IMPRESSION: Bibasilar atelectasis with coarsened interstitial changes similar to comparison. No other acute cardiopulmonary abnormality. Aortic Atherosclerosis (ICD10-I70.0). Electronically Signed   By: Lovena Le M.D.   On: 05/23/2020 19:22   Wt Readings from Last 3 Encounters:  05/27/20 106 lb 5 oz (48.2 kg)  05/23/20 115 lb (52.2 kg)  04/16/20 106 lb 9 oz (48.3 kg)   20.42  kg/m  Thought her faint came from being under hot dryer  Her head lagged forward (? Cut airway off)  Came around quickly when she laid down She asked the staff to finish her hair when she regained consciousness (no post ictal state)   Has never fainted before   When she got home-she slept for quite a while  The next day she had trouble walking- very weak and tired   Today back to normal  No cp or sob or headaches  She is getting back on her normal schedule   Per daughter-given TED hose -not wearing today  No leg swelling or hypotension   BP Readings from Last 3 Encounters:  05/27/20 112/70  05/24/20 118/77  04/16/20 122/70   Pulse Readings from Last 3 Encounters:  05/27/20 87  05/24/20 (!) 106  04/16/20 69   Did not start namenda yet Still giving seroquel on schedule  Mirtazapine at bedtime  Overall stable when at home re: mental status   Patient Active Problem List   Diagnosis Date Noted  . Syncope 05/23/2020  . Sundowning 05/23/2020  . Pressure ulcer 04/12/2020  . Dementia with behavioral disturbance ([place])   . Acute blood loss as cause of postoperative anemia 06/27/2019  . History of pelvic fracture 06/27/2019  . Closed displaced intertrochanteric fracture of left femur ([place]) 06/21/2019  . Anxiety with depression 06/21/2019  . Diarrhea 03/24/2019  . Shingles 01/23/2019  . Fracture, ulna, distal 12/06/2018  . Thrombocytopenia ([place]) 12/06/2018  . Dementia, senile ([place]) 11/22/2018  . Hand injury, left, initial encounter 09/01/2018  . H/O falling 05/03/2018  . Episodic confusion 05/03/2018  . Fall at home 03/29/2018  . Anxiety disorder 03/29/2018  . Fatigue 03/29/2018  . Chronic pain 02/09/2018  . Cerumen impaction 12/06/2017  . H/O Clostridium difficile infection 10/13/2017  . Recurrent UTI (urinary tract infection) 09/06/2017  . Auditory hallucinations 03/03/2017  . Female cystocele 04/13/2016  . Bowel habit changes 12/30/2015  . Compression fracture  12/30/2015  . Routine general medical examination at a health care facility 04/09/2015  . Candidal intertrigo 03/28/2014  . Bilateral hearing loss due to cerumen impaction 01/17/2014  . Encounter for Medicare annual wellness exam 03/21/2013  . Gout 09/19/2012  . Age related osteoporosis 05/05/2011  . Degenerative lumbar disc 01/20/2011  . VARICOSE VEINS, LOWER EXTREMITIES 09/04/2010  . Prediabetes 01/08/2010  . Hyperlipidemia 05/09/2008  . Essential hypertension 05/09/2008  . HEMORRHOIDS, INTERNAL 05/09/2008  . Allergic rhinitis 05/09/2008  . Mild reactive airways disease 05/09/2008  . GERD 05/09/2008  . IBS 05/09/2008  . Osteoarthritis 05/09/2008  . History of malignant neoplasm of large intestine 05/09/2008   Past Medical History:  Diagnosis Date  . Allergic rhinitis   . Anxiety   .  C. difficile diarrhea 10/2017  . Cancer ([place])   . Depression   . GERD (gastroesophageal reflux disease)   . History of colon cancer   . History of recurrent UTIs   . Hyperlipidemia   . Hypertension   . Osteoarthritis   . Spinal compression fracture ([place])   . Vertigo    Past Surgical History:  Procedure Laterality Date  . APPENDECTOMY    . CATARACT EXTRACTION    . COLON RESECTION    . INTRAMEDULLARY (IM) NAIL INTERTROCHANTERIC Left 06/22/2019   Procedure: INTRAMEDULLARY (IM) NAIL INTERTROCHANTRIC;  Surgeon: Rod Can, MD;  Location: [facility];  Service: Orthopedics;  Laterality: Left;  . TONSILLECTOMY     Social History   Tobacco Use  . Smoking status: Never Smoker  . Smokeless tobacco: Never Used  Substance Use Topics  . Alcohol use: No    Alcohol/week: 0.0 standard drinks  . Drug use: No   Family History  Problem Relation Age of Onset  . Prostate cancer Father   . Heart attack Mother   . Gout Mother    Allergies  Allergen Reactions  . Amoxicillin-Pot Clavulanate Diarrhea    Has patient had a PCN reaction causing immediate rash, facial/tongue/throat swelling, SOB or  lightheadedness with hypotension: Yes Has patient had a PCN reaction causing severe rash involving mucus membranes or skin necrosis: No Has patient had a PCN reaction that required hospitalization: No Has patient had a PCN reaction occurring within the last 10 years: No If all of the above answers are "NO", then may proceed with Cephalosporin use.   Marland Kitchen Keflex [Cephalexin] Diarrhea  . Septra [Sulfamethoxazole-Trimethoprim] Nausea Only   Current Outpatient Medications on File Prior to Visit  Medication Sig Dispense Refill  . allopurinol (ZYLOPRIM) 100 MG tablet TAKE 2 TABLETS BY MOUTH  DAILY (Patient taking differently: Take 100 mg by mouth daily. ) 180 tablet 1  . Ascorbic Acid (VITAMIN C) 100 MG tablet Take 100 mg by mouth daily.    Marland Kitchen aspirin 81 MG tablet Take 81 mg by mouth daily.      . calcium-vitamin D (CALCIUM 500 +D) 500 MG tablet Take 1 tablet by mouth daily.     Marland Kitchen CRANBERRY PO Take 1 capsule by mouth 2 (two) times daily.    Marland Kitchen gabapentin (NEURONTIN) 300 MG capsule Take 300 mg by mouth 2 (two) times daily.   6  . HYDROcodone-acetaminophen (NORCO/VICODIN) 5-325 MG tablet Take 1 tablet by mouth every 8 (eight) hours as needed for severe pain. (Patient taking differently: Take 1 tablet by mouth every 8 (eight) hours as needed for moderate pain or severe pain. ) 30 tablet 0  . metoprolol tartrate (LOPRESSOR) 50 MG tablet TAKE 1 TABLET BY MOUTH  TWICE DAILY (Patient taking differently: Take 50 mg by mouth 2 (two) times daily. ) 180 tablet 3  . mirtazapine (REMERON) 15 MG tablet Take 1 tablet (15 mg total) by mouth at bedtime. 30 tablet 11  . multivitamin (THERAGRAN) per tablet Take 1 tablet by mouth daily.      . Probiotic Product (PROBIOTIC DAILY PO) Take 1 tablet by mouth daily.    . psyllium (REGULOID) 0.52 g capsule Take 1.56 g by mouth daily.    . QUEtiapine (SEROQUEL) 25 MG tablet Take 2 tabs by mouth every morning and one pill in the evening (Patient taking differently: Take 25 mg by  mouth 3 (three) times daily. Take 2 tabs by mouth every morning and one pill in the evening)  90 tablet 5   No current facility-administered medications on file prior to visit.    Review of Systems  Constitutional: Negative for activity change, appetite change, fatigue, fever and unexpected weight change.  HENT: Negative for congestion, ear pain, rhinorrhea, sinus pressure and sore throat.   Eyes: Negative for pain, redness and visual disturbance.  Respiratory: Negative for cough, shortness of breath and wheezing.   Cardiovascular: Negative for chest pain and palpitations.  Gastrointestinal: Negative for abdominal pain, blood in stool, constipation and diarrhea.  Endocrine: Negative for polydipsia and polyuria.  Genitourinary: Negative for dysuria, frequency and urgency.  Musculoskeletal: Negative for arthralgias, back pain and myalgias.  Skin: Negative for pallor and rash.  Allergic/Immunologic: Negative for environmental allergies.  Neurological: Positive for weakness. Negative for dizziness, tremors, seizures, syncope, facial asymmetry, speech difficulty, light-headedness, numbness and headaches.       Was generally weak after return home-now back to baseline  Hematological: Negative for adenopathy. Does not bruise/bleed easily.  Psychiatric/Behavioral: Positive for confusion. Negative for decreased concentration and dysphoric mood. The patient is not nervous/anxious.        Baseline dementia       Objective:   Physical Exam Constitutional:      General: She is not in acute distress.    Appearance: Normal appearance. She is well-developed and normal weight. She is not ill-appearing or diaphoretic.     Comments: Frail appearing elderly female   HENT:     Head: Normocephalic and atraumatic.     Mouth/Throat:     Mouth: Mucous membranes are moist.  Eyes:     General: No scleral icterus.    Conjunctiva/sclera: Conjunctivae normal.     Pupils: Pupils are equal, round, and reactive  to light.  Neck:     Thyroid: No thyromegaly.     Vascular: No carotid bruit or JVD.  Cardiovascular:     Rate and Rhythm: Normal rate and regular rhythm.     Pulses: Normal pulses.     Heart sounds: Normal heart sounds. No gallop.   Pulmonary:     Effort: Pulmonary effort is normal. No respiratory distress.     Breath sounds: Normal breath sounds. No wheezing, rhonchi or rales.  Abdominal:     General: Bowel sounds are normal. There is no distension or abdominal bruit.     Palpations: Abdomen is soft. There is no mass.     Tenderness: There is no abdominal tenderness. There is no right CVA tenderness or left CVA tenderness.  Musculoskeletal:        General: No tenderness or signs of injury.     Cervical back: Normal range of motion and neck supple.  Lymphadenopathy:     Cervical: No cervical adenopathy.  Skin:    General: Skin is warm and dry.     Coloration: Skin is not jaundiced or pale.     Findings: No bruising, erythema or rash.  Neurological:     Mental Status: She is alert.     Cranial Nerves: No cranial nerve deficit.     Sensory: No sensory deficit.     Coordination: Coordination normal.     Deep Tendon Reflexes: Reflexes are normal and symmetric. Reflexes normal.     Comments: Walks assisted today  About back to baseline   Psychiatric:        Mood and Affect: Mood normal.        Cognition and Memory: She exhibits impaired recent memory.     Comments: Mood  is good today  Answers some questions  Asks when she can get her hair done           Assessment & Plan:   Problem List Items Addressed This Visit      Cardiovascular and Mediastinum   Essential hypertension (Chronic)    bp is back to baseline after syncopal episode and obs in hospital  No positional changes Reviewed hospital records, lab results and studies in detail        Syncope - Primary    Episode of syncope getting hair done Suspect positional change and hot dryer may have caused a vasovagal  event  Reassuring ER w/u and obs  Disc imp of hydration and close monitoring  W/o cardiac symptoms - will hold off on echocardiogram at this time  Will also hold off on namenda for now due to small risk of prolonged QT and hypotension with it  inst to alert [place] if any more syncope of pre syncope        Nervous and Auditory   Dementia with behavioral disturbance (HCC) (Chronic)    Stable with mirtazapine for mood and seroquel for hallucination/agitation Will hold off on namenda currently given recent syncope and continue to obs

## 2020-05-27 NOTE — Assessment & Plan Note (Signed)
bp is back to baseline after syncopal episode and obs in hospital  No positional changes Reviewed hospital records, lab results and studies in detail

## 2020-05-27 NOTE — Patient Outreach (Addendum)
[facility]) Care Management  05/27/2020  Michelle Gregory [birth year] [id number]   CSW made contact with pt's daugther, [name], by phone who reports pt "an episode that sent [place] to the ER". Per daughter, they could not find any signs of stroke and did lab work.  Per daughter, "they think when she fell asleep in the chair with the hair dryer over her that she cut off oxygen".  Per daughter, she has been fine since however they are going to see PCP today for further assessment/answers.  Pt's daughter indicates they have not been able to actually tour the ALF [facility]) that they are considering for respite care; "because of everything going on".  Daughter has some medical questions ("If she is having mini strokes does that mean she will continue having them" ) and is encouraged to ask at PCP appointment today.    CSW offered support to daughter and her caregiving for pt.   CSW offered to touch base again; daughter requests callback in a few weeks.   Eduard Clos, MSW, [place] Worker  [place] [phone]

## 2020-05-27 NOTE — Patient Instructions (Signed)
Keep an eye out for pallor/change in mental status and also fluctuations in blood pressure (manifasted as dizziness and lightheadedness)  Keep me posted Hold off on namenda for now  Careful with heat/ temp change  Talk about alternative ways to wash hair without as much positional change

## 2020-05-27 NOTE — Assessment & Plan Note (Signed)
Episode of syncope getting hair done Suspect positional change and hot dryer may have caused a vasovagal event  Reassuring ER w/u and obs  Disc imp of hydration and close monitoring  W/o cardiac symptoms - will hold off on echocardiogram at this time  Will also hold off on namenda for now due to small risk of prolonged QT and hypotension with it  inst to alert [place] if any more syncope of pre syncope

## 2020-05-27 NOTE — Assessment & Plan Note (Signed)
Stable with mirtazapine for mood and seroquel for hallucination/agitation Will hold off on namenda currently given recent syncope and continue to obs

## 2020-05-28 LAB — CBG MONITORING, ED: Glucose-Capillary: 94 mg/dL (ref 70–99)

## 2020-06-04 ENCOUNTER — Ambulatory Visit: Payer: MEDICARE

## 2020-06-04 ENCOUNTER — Telehealth: Payer: Self-pay

## 2020-06-04 ENCOUNTER — Other Ambulatory Visit: Payer: Self-pay

## 2020-06-04 NOTE — Telephone Encounter (Signed)
Called patient 3 times attempting to complete her Medicare visit. Patient never answered. Left message notifying patient appointment was cancelled and to call office and reschedule appointment.

## 2020-06-05 ENCOUNTER — Telehealth: Payer: Self-pay | Admitting: Family Medicine

## 2020-06-05 NOTE — Telephone Encounter (Signed)
Michelle Gregory notified of Dr. Marliss Coots comments. Michelle Gregory said she requested Dr. Glori Bickers to call because she wants to discuss pt going to an assisted living for respite care, Michelle Gregory request PCP to call back at [phone]

## 2020-06-05 NOTE — Telephone Encounter (Signed)
I think assisted living is the best next step for sure-I would begin to pursue that (look at places that offer memory care)  Her condition will most likely continue to worsen with age as well so being prepared is the best way to go about it.  If they think something else is going on - have her f/u

## 2020-06-05 NOTE — Telephone Encounter (Signed)
Patient's daughter called in stating she would like to speak with PCP. Michelle Gregory is stating her mother has been okay during the day but has been sundowning. States she is combative, but not physical. She is hard to redirect and has some confusion. Patient and daughter did view assisted living and want to know if this is the correct path to take with her mother. Please advise. Call back is [phone].

## 2020-06-07 ENCOUNTER — Telehealth: Payer: Self-pay | Admitting: Family Medicine

## 2020-06-07 ENCOUNTER — Other Ambulatory Visit (INDEPENDENT_AMBULATORY_CARE_PROVIDER_SITE_OTHER): Payer: MEDICARE

## 2020-06-07 ENCOUNTER — Other Ambulatory Visit: Payer: Self-pay

## 2020-06-07 DIAGNOSIS — Z87440 Personal history of urinary (tract) infections: Secondary | ICD-10-CM | POA: Diagnosis not present

## 2020-06-07 DIAGNOSIS — R410 Disorientation, unspecified: Secondary | ICD-10-CM | POA: Diagnosis not present

## 2020-06-07 LAB — POC URINALSYSI DIPSTICK (AUTOMATED)
Bilirubin, UA: NEGATIVE
Blood, UA: NEGATIVE
Glucose, UA: NEGATIVE
Ketones, UA: NEGATIVE
Leukocytes, UA: NEGATIVE
Nitrite, UA: NEGATIVE
Protein, UA: POSITIVE — AB
Spec Grav, UA: 1.02 (ref 1.010–1.025)
Urobilinogen, UA: 0.2 E.U./dL
pH, UA: 6 (ref 5.0–8.0)

## 2020-06-07 NOTE — Telephone Encounter (Signed)
Please see prev message, pt's daughter is still waiting for PCP to call her directly

## 2020-06-07 NOTE — Telephone Encounter (Signed)
Confusion and hallucinations.  No foul odor, no frequency but Depends is always wet when she goes to the bathroom.

## 2020-06-07 NOTE — Telephone Encounter (Signed)
Urine looks clear-we will still culture it and let them know when it returns -likely Monday

## 2020-06-07 NOTE — Telephone Encounter (Signed)
Patient's daughter called in stating she is not having any burning, odor, or pain while urinating. States patient is having confusion as well as hallucinations of what is going on. Please advise.

## 2020-06-07 NOTE — Telephone Encounter (Signed)
Patient's daughter called back in stating she has not heard from PCP. States they may schedule a visit to discuss further issues with PCP, however would like to speak with [place] about a few things before. Please advise and call back is [phone].

## 2020-06-07 NOTE — Telephone Encounter (Signed)
Thanks for letting me know- will make Michelle Gregory and Michelle Gregory aware  Let me know what symptoms she is having

## 2020-06-07 NOTE — Telephone Encounter (Signed)
Patient's daughter [name] called stating her mother has a really bad UTI and she's planning to go ahead and drop off a specimen today before we close for the weekend, but wanted me to go ahead and let Dr.Tower know whats going on. Please call for any questions and concerns.

## 2020-06-09 LAB — URINE CULTURE
MICRO NUMBER:: 10797063
SPECIMEN QUALITY:: ADEQUATE

## 2020-06-10 NOTE — Telephone Encounter (Signed)
They are looking at [facility] for respite/ to transition likely into dementia care Worse and worse sundowning now Needs placement for safety as things worsen  She will have them send me FL2 and schedule an appt for Enedelia next week in office so we can talk to her

## 2020-06-10 NOTE — Telephone Encounter (Signed)
Have left messages-will try again to reach later

## 2020-06-12 ENCOUNTER — Ambulatory Visit: Payer: MEDICARE | Admitting: Podiatry

## 2020-06-13 ENCOUNTER — Telehealth: Payer: Self-pay | Admitting: Family Medicine

## 2020-06-13 NOTE — Telephone Encounter (Signed)
FYI pt doesn't have an appt with PCP only appt scheduled is her [facility] phone call, last OV was 05/27/20 for a hospital f/u. Please fill out form so we can get form to assisted living facility for pt

## 2020-06-13 NOTE — Telephone Encounter (Signed)
Michelle Gregory wanted to make sure you received FL2 form from [facility].  memantin rx  they were holding off giving to pt.  She stated she is going to start this med today she has nothing to lose and everything to gain at this point

## 2020-06-13 NOTE — Telephone Encounter (Signed)
They did send me the form  Looks like I will see them Wednesday  Keep me posted

## 2020-06-14 NOTE — Telephone Encounter (Signed)
Done and in Shapale's IN box

## 2020-06-17 ENCOUNTER — Ambulatory Visit: Payer: MEDICARE

## 2020-06-17 NOTE — Telephone Encounter (Signed)
Faxed form to number provided. Left at Shapale's desk in case it does not go through.

## 2020-06-17 NOTE — Telephone Encounter (Signed)
tPt has appointment with you Friday 06/21/20 @ 2 to discuss assisted living

## 2020-06-17 NOTE — Telephone Encounter (Signed)
Spoke to [employer]. She asked that the form be faxed to the home. The fax number was supposed to be with the form. I will look and fax it if so. Otherwise, [name] will pickup form at [facility].

## 2020-06-18 ENCOUNTER — Other Ambulatory Visit: Payer: Self-pay | Admitting: *Deleted

## 2020-06-18 NOTE — Patient Outreach (Signed)
[facility]) Care Management  06/18/2020  Michelle Gregory [birth year] [id number]   CSW spoke with pt's family, [name], who reports they are taking pt to the PCP on Friday to "see is she can talk her into placement".  Family is hoping to have pt admitted to [facility] ALF. PCP has completed FL2 form per family and the ALF RN assessment for level of care determination still needs to be done.   Family is optimistic that this will all fall into place and pt can be transferred to long term care at [facility] in the next few weeks.   CSW reminded family to call if needs arise prior to follow up call in 2 weeks.    Eduard Clos, MSW, [place] Worker  [place] [phone]

## 2020-06-19 ENCOUNTER — Ambulatory Visit: Payer: MEDICARE

## 2020-06-19 NOTE — Telephone Encounter (Signed)
Form refaxed and it did go through on my end. Left VM letting daughter [name] know FL2 resent.

## 2020-06-19 NOTE — Telephone Encounter (Signed)
Michelle Gregory called stating [facility] didn't receive fax  Please r/s to [phone]  ATTN: PAM  Michelle Gregory stating they have room ready and wanted to get assesment started

## 2020-06-21 ENCOUNTER — Ambulatory Visit: Payer: MEDICARE | Admitting: Family Medicine

## 2020-06-26 DIAGNOSIS — Z23 Encounter for immunization: Secondary | ICD-10-CM | POA: Diagnosis not present

## 2020-07-02 ENCOUNTER — Other Ambulatory Visit: Payer: Self-pay | Admitting: *Deleted

## 2020-07-02 NOTE — Patient Outreach (Signed)
[facility]) Care Management  07/02/2020  Michelle Gregory 1923/05/29 [id number]   CSW spoke with pt's daughter, [name], today by phone; who reports, "things are much better".  Pt was started on Namenda and pt's daughter feels this has made a tremendous improvement in her mother's cognition and behaviors.  She is pleased and feels "because we are able to sleep better through the night we may not need the respite stay". They continue to have a hired caregiver 4 hours twice weekly and can increase it if needed.   CSW mentioned some online dementia resources that may be helpful and will email this info to daughter.   Pt's daughter appreciative of support and requests follow up in 1 month.   Eduard Clos, MSW, [place] Worker  [name] [phone]

## 2020-07-12 NOTE — Telephone Encounter (Signed)
Michelle Gregory called wanting to know if you could refax FL2 to [facility].  She stated the person that received this paperwork has left company and they cannot find any paperwork.  She wanted to know if you could changed dated to todays date  Please advise  Best number for lynn  [phone]

## 2020-07-12 NOTE — Telephone Encounter (Signed)
Date updated and FL2 form refaxed. [name] notified, she did say the nursing home needs a TB test, she is going to find out if they accept the TB labs so she doesn't have to bring pt up here twice if we do the skin test, depending on what the nursing home says she will call back and schedule an appt

## 2020-07-17 DIAGNOSIS — Z23 Encounter for immunization: Secondary | ICD-10-CM | POA: Diagnosis not present

## 2020-07-19 ENCOUNTER — Telehealth: Payer: Self-pay | Admitting: Family Medicine

## 2020-07-19 NOTE — Telephone Encounter (Signed)
Patient's daughter, Michelle Gregory, called.  Patient is going to be living at [facility].  Melinda,[place], will be calling on Monday to discuss FL-2 and medications.  Michelle Gregory is giving permission to speak to [place].

## 2020-07-19 NOTE — Telephone Encounter (Signed)
That sounds great, I will watch out for correspondence

## 2020-07-19 NOTE — Telephone Encounter (Signed)
FYI

## 2020-07-24 ENCOUNTER — Ambulatory Visit (INDEPENDENT_AMBULATORY_CARE_PROVIDER_SITE_OTHER): Payer: MEDICARE | Admitting: Family Medicine

## 2020-07-24 ENCOUNTER — Other Ambulatory Visit: Payer: Self-pay

## 2020-07-24 ENCOUNTER — Encounter: Payer: Self-pay | Admitting: Family Medicine

## 2020-07-24 VITALS — BP 114/66 | HR 74 | Temp 96.8°F | Ht 60.5 in | Wt 110.5 lb

## 2020-07-24 DIAGNOSIS — F0281 Dementia in other diseases classified elsewhere with behavioral disturbance: Secondary | ICD-10-CM

## 2020-07-24 DIAGNOSIS — D696 Thrombocytopenia, unspecified: Secondary | ICD-10-CM

## 2020-07-24 DIAGNOSIS — Z111 Encounter for screening for respiratory tuberculosis: Secondary | ICD-10-CM | POA: Diagnosis not present

## 2020-07-24 DIAGNOSIS — I10 Essential (primary) hypertension: Secondary | ICD-10-CM | POA: Diagnosis not present

## 2020-07-24 DIAGNOSIS — F02818 Dementia in other diseases classified elsewhere, unspecified severity, with other behavioral disturbance: Secondary | ICD-10-CM

## 2020-07-24 DIAGNOSIS — G3109 Other frontotemporal dementia: Secondary | ICD-10-CM | POA: Diagnosis not present

## 2020-07-24 MED ORDER — MEMANTINE HCL 5 MG PO TABS: 5.0000 mg | ORAL_TABLET | Freq: Two times a day (BID) | ORAL | 0 refills | Status: AC

## 2020-07-24 NOTE — Assessment & Plan Note (Signed)
bp in fair control at this time  BP Readings from Last 1 Encounters:  07/24/20 114/66   No changes needed Most recent labs reviewed  Disc lifstyle change with low sodium diet and exercise  Labs today

## 2020-07-24 NOTE — Assessment & Plan Note (Signed)
Cbc today  No new bleeding or bruising

## 2020-07-24 NOTE — Progress Notes (Signed)
Subjective:    Patient ID: Michelle Gregory, female    DOB: 26-Jul-1923, 86 y.o.   MRN: [id number]  This visit occurred during the SARS-CoV-2 public health emergency.  Safety protocols were in place, including screening questions prior to the visit, additional usage of staff PPE, and extensive cleaning of exam room while observing appropriate contact time as indicated for disinfecting solutions.    HPI Pt presents with family to discuss going to an assisted living  Daughter Michelle Gregory is primary caregiver  Wt Readings from Last 3 Encounters:  07/24/20 110 lb 8 oz (50.1 kg)  05/27/20 106 lb 5 oz (48.2 kg)  05/23/20 115 lb (52.2 kg)   21.23 kg/m  86 yo with dementia that causes hallucinations and sundowning Takes mirtazapine 15 mg at bedtime  Also seroquel 50 in am and 25 in evening  Now on Namenda  [facility]/social work is involved  Is able to sleep- is able to sleep through the night most of the time  Family had to give her alprazolam last night at 5 pm  [place] last weekend and abrasion on her L arm  Getting rehab- getting weaker and weaker   Looking at [facility]  She does not want to go    Needs TB test  Had covid shot last week   Due for flu shot when able-wants to wait a bit   HTN bp is stable today  No cp or palpitations or headaches or edema  No side effects to medicines  BP Readings from Last 3 Encounters:  07/24/20 114/66  05/27/20 112/70  05/24/20 118/77     Lab Results  Component Value Date   CREATININE 0.99 05/23/2020   BUN 21 05/23/2020   NA 139 05/23/2020   K 4.1 05/23/2020   CL 97 (L) 05/23/2020   CO2 29 05/23/2020   Lab Results  Component Value Date   ALT 12 06/30/2019   AST 17 06/30/2019   ALKPHOS 37 (L) 06/30/2019   BILITOT 1.1 06/30/2019   Lab Results  Component Value Date   WBC 8.1 05/23/2020   HGB 14.0 05/23/2020   HCT 43.1 05/23/2020   MCV 100.7 (H) 05/23/2020   PLT PLATELET CLUMPS NOTED ON SMEAR, UNABLE TO ESTIMATE 05/23/2020   Lab  Results  Component Value Date   TSH 1.99 02/01/2019   Lab Results  Component Value Date   HGBA1C 5.6 02/01/2019    Patient Active Problem List   Diagnosis Date Noted  . Screening-pulmonary TB 07/24/2020  . Syncope 05/23/2020  . Sundowning 05/23/2020  . Dementia with behavioral disturbance ([place])   . Acute blood loss as cause of postoperative anemia 06/27/2019  . History of pelvic fracture 06/27/2019  . Closed displaced intertrochanteric fracture of left femur ([place]) 06/21/2019  . Anxiety with depression 06/21/2019  . Diarrhea 03/24/2019  . Shingles 01/23/2019  . Fracture, ulna, distal 12/06/2018  . Thrombocytopenia ([place]) 12/06/2018  . Dementia, senile ([place]) 11/22/2018  . Hand injury, left, initial encounter 09/01/2018  . H/O falling 05/03/2018  . Episodic confusion 05/03/2018  . Fall at home 03/29/2018  . Anxiety disorder 03/29/2018  . Fatigue 03/29/2018  . Chronic pain 02/09/2018  . Cerumen impaction 12/06/2017  . H/O Clostridium difficile infection 10/13/2017  . Recurrent UTI (urinary tract infection) 09/06/2017  . Auditory hallucinations 03/03/2017  . Female cystocele 04/13/2016  . Bowel habit changes 12/30/2015  . Compression fracture 12/30/2015  . Routine general medical examination at a health care facility 04/09/2015  . [place]  intertrigo 03/28/2014  . Bilateral hearing loss due to cerumen impaction 01/17/2014  . Encounter for Medicare annual wellness exam 03/21/2013  . Gout 09/19/2012  . Age related osteoporosis 05/05/2011  . Degenerative lumbar disc 01/20/2011  . VARICOSE VEINS, LOWER EXTREMITIES 09/04/2010  . Prediabetes 01/08/2010  . Hyperlipidemia 05/09/2008  . Essential hypertension 05/09/2008  . HEMORRHOIDS, INTERNAL 05/09/2008  . Allergic rhinitis 05/09/2008  . Mild reactive airways disease 05/09/2008  . GERD 05/09/2008  . IBS 05/09/2008  . Osteoarthritis 05/09/2008  . History of malignant neoplasm of large intestine 05/09/2008   Past Medical  History:  Diagnosis Date  . Allergic rhinitis   . Anxiety   . C. difficile diarrhea 10/2017  . Cancer ([place])   . Depression   . GERD (gastroesophageal reflux disease)   . History of colon cancer   . History of recurrent UTIs   . Hyperlipidemia   . Hypertension   . Osteoarthritis   . Spinal compression fracture ([place])   . Vertigo    Past Surgical History:  Procedure Laterality Date  . APPENDECTOMY    . CATARACT EXTRACTION    . COLON RESECTION    . INTRAMEDULLARY (IM) NAIL INTERTROCHANTERIC Left 06/22/2019   Procedure: INTRAMEDULLARY (IM) NAIL INTERTROCHANTRIC;  Surgeon: Rod Can, MD;  Location: [facility];  Service: Orthopedics;  Laterality: Left;  . TONSILLECTOMY     Social History   Tobacco Use  . Smoking status: Never Smoker  . Smokeless tobacco: Never Used  Substance Use Topics  . Alcohol use: No    Alcohol/week: 0.0 standard drinks  . Drug use: No   Family History  Problem Relation Age of Onset  . Prostate cancer Father   . Heart attack Mother   . Gout Mother    Allergies  Allergen Reactions  . Amoxicillin-Pot Clavulanate Diarrhea    Has patient had a PCN reaction causing immediate rash, facial/tongue/throat swelling, SOB or lightheadedness with hypotension: Yes Has patient had a PCN reaction causing severe rash involving mucus membranes or skin necrosis: No Has patient had a PCN reaction that required hospitalization: No Has patient had a PCN reaction occurring within the last 10 years: No If all of the above answers are "NO", then may proceed with Cephalosporin use.   Marland Kitchen Keflex [Cephalexin] Diarrhea  . Septra [Sulfamethoxazole-Trimethoprim] Nausea Only   Current Outpatient Medications on File Prior to Visit  Medication Sig Dispense Refill  . allopurinol (ZYLOPRIM) 100 MG tablet TAKE 2 TABLETS BY MOUTH  DAILY (Patient taking differently: Take 100 mg by mouth daily. ) 180 tablet 1  . Ascorbic Acid (VITAMIN C) 100 MG tablet Take 100 mg by mouth daily.    Marland Kitchen  aspirin 81 MG tablet Take 81 mg by mouth daily.      . calcium-vitamin D (CALCIUM 500 +D) 500 MG tablet Take 1 tablet by mouth daily.     Marland Kitchen CRANBERRY PO Take 1 capsule by mouth 2 (two) times daily.    Marland Kitchen gabapentin (NEURONTIN) 300 MG capsule Take 300 mg by mouth 2 (two) times daily.   6  . HYDROcodone-acetaminophen (NORCO/VICODIN) 5-325 MG tablet Take 1 tablet by mouth every 8 (eight) hours as needed for severe pain. (Patient taking differently: Take 1 tablet by mouth every 8 (eight) hours as needed for moderate pain or severe pain. ) 30 tablet 0  . metoprolol tartrate (LOPRESSOR) 50 MG tablet TAKE 1 TABLET BY MOUTH  TWICE DAILY (Patient taking differently: Take 50 mg by mouth 2 (two) times daily. )  180 tablet 3  . mirtazapine (REMERON) 15 MG tablet Take 1 tablet (15 mg total) by mouth at bedtime. 30 tablet 11  . multivitamin (THERAGRAN) per tablet Take 1 tablet by mouth daily.      . Probiotic Product (PROBIOTIC DAILY PO) Take 1 tablet by mouth daily.    . psyllium (REGULOID) 0.52 g capsule Take 1.56 g by mouth daily.    . QUEtiapine (SEROQUEL) 25 MG tablet Take 2 tabs by mouth every morning and one pill in the evening (Patient taking differently: Take 25 mg by mouth 3 (three) times daily. Take 1 tab by mouth in the am, 2 tabs at 5 pm and 1 tab at bedtime) 90 tablet 5   No current facility-administered medications on file prior to visit.      Review of Systems  Constitutional: Positive for fatigue. Negative for activity change, appetite change, fever and unexpected weight change.  HENT: Negative for congestion, ear pain, rhinorrhea, sinus pressure and sore throat.   Eyes: Negative for pain, redness and visual disturbance.  Respiratory: Negative for cough, shortness of breath and wheezing.   Cardiovascular: Negative for chest pain and palpitations.  Gastrointestinal: Negative for abdominal pain, blood in stool, constipation and diarrhea.  Endocrine: Negative for polydipsia and polyuria.    Genitourinary: Negative for dysuria, frequency and urgency.  Musculoskeletal: Negative for arthralgias, back pain and myalgias.  Skin: Negative for pallor and rash.  Allergic/Immunologic: Negative for environmental allergies.  Neurological: Negative for dizziness, syncope and headaches.  Hematological: Negative for adenopathy. Does not bruise/bleed easily.  Psychiatric/Behavioral: Positive for agitation, confusion and decreased concentration. Negative for dysphoric mood, self-injury and sleep disturbance. The patient is nervous/anxious.        Objective:   Physical Exam Constitutional:      General: She is not in acute distress.    Appearance: Normal appearance. She is well-developed and normal weight. She is not ill-appearing.     Comments: Frail appearing elderly female   HENT:     Head: Normocephalic and atraumatic.  Eyes:     General: No scleral icterus.    Conjunctiva/sclera: Conjunctivae normal.     Pupils: Pupils are equal, round, and reactive to light.  Neck:     Thyroid: No thyromegaly.     Vascular: No carotid bruit or JVD.  Cardiovascular:     Rate and Rhythm: Normal rate and regular rhythm.     Heart sounds: Normal heart sounds. No gallop.   Pulmonary:     Effort: Pulmonary effort is normal. No respiratory distress.     Breath sounds: Normal breath sounds. No wheezing or rales.  Abdominal:     General: Bowel sounds are normal. There is no distension or abdominal bruit.     Palpations: Abdomen is soft. There is no mass.  Musculoskeletal:     Cervical back: Normal range of motion and neck supple.     Right lower leg: No edema.     Left lower leg: No edema.  Lymphadenopathy:     Cervical: No cervical adenopathy.  Skin:    General: Skin is warm and dry.     Coloration: Skin is not pale.     Findings: No erythema or rash.  Neurological:     Mental Status: She is alert.     Cranial Nerves: No cranial nerve deficit.     Deep Tendon Reflexes: Reflexes are normal  and symmetric.     Comments: Generalized (not focal) weakness  Psychiatric:  Mood and Affect: Affect is blunt.        Speech: Speech is tangential.        Behavior: Behavior normal.        Cognition and Memory: Cognition is impaired. Memory is impaired. She exhibits impaired recent memory.     Comments: Pt talks tangentially /occ not making sense Generally blunt affect  Voices anger towards daughter   Poor memory  Not oriented            Assessment & Plan:   Problem List Items Addressed This Visit      Cardiovascular and Mediastinum   Essential hypertension (Chronic)    bp in fair control at this time  BP Readings from Last 1 Encounters:  07/24/20 114/66   No changes needed Most recent labs reviewed  Disc lifstyle change with low sodium diet and exercise  Labs today      Relevant Orders   Basic metabolic panel   CBC with Differential/Platelet   TSH     Nervous and Auditory   Dementia with behavioral disturbance (HCC) - Primary (Chronic)    More sundowning behavior/agitation  (around 5 pm)  Will change seroquel to 25 in am 50 at 5 pm and 25 at bedtime  Continue mirtazapine and namenda Encourage meals  Plan to move to assisted living  TB screen done and then will fax FL2 to [facility] Will focus on PT/OT to help with strength and safety       Relevant Medications   memantine (NAMENDA) 5 MG tablet     Other   Thrombocytopenia (HCC)    Cbc today  No new bleeding or bruising      Screening-pulmonary TB   Relevant Orders   QuantiFERON-TB Gold Plus

## 2020-07-24 NOTE — Assessment & Plan Note (Signed)
More sundowning behavior/agitation  (around 5 pm)  Will change seroquel to 25 in am 50 at 5 pm and 25 at bedtime  Continue mirtazapine and namenda Encourage meals  Plan to move to assisted living  TB screen done and then will fax FL2 to [facility] Will focus on PT/OT to help with strength and safety

## 2020-07-24 NOTE — Patient Instructions (Signed)
We will get the [facility] faxed to [facility]   Increase the quetiapine to 1 pill in am and 2 at five pm and 1 at bedtime   Labs today for TB screening and also chemistries/cbc/tsh   Keep me posted if any changes  PT and OT are a good idea

## 2020-07-25 ENCOUNTER — Telehealth: Payer: Self-pay | Admitting: *Deleted

## 2020-07-25 NOTE — Telephone Encounter (Signed)
That was my fault= I accidentally ordered for future -please apologize for me ! Not that important-can do next time she is here  Thanks

## 2020-07-25 NOTE — Telephone Encounter (Signed)
Terri only did labs for TB because the other labs were ordered future, Terri not sure if she was suppose to do the other labs yesterday or if pt was suppose to come back at a later date to get those done. If pt needed the other labs done then she will need to come back for a redraw, please advise

## 2020-07-26 NOTE — Telephone Encounter (Signed)
It is on my desk signed and filled out so if you need to send while I am gone that is where it is  Thanks

## 2020-07-26 NOTE — Telephone Encounter (Signed)
FYI to PCP, I know we were waiting for TB results to come back

## 2020-07-26 NOTE — Telephone Encounter (Signed)
[name] (daughter) called stating to hold off on sending paperwork [facility]   She will call back to let us know when to send.  She maybe sending pt somewhere else

## 2020-07-27 LAB — QUANTIFERON-TB GOLD PLUS
Mitogen-NIL: >10 IU/mL
NIL: 0.05 IU/mL
QuantiFERON-TB Gold Plus: NEGATIVE
TB1-NIL: <0 IU/mL
TB2-NIL: 0 IU/mL

## 2020-07-29 NOTE — Telephone Encounter (Signed)
Michelle Gregory called wanting you to go ahead and send paperwork [facility]

## 2020-07-29 NOTE — Telephone Encounter (Signed)
TB lab and FL2 faxed to [facility] and Daughter notified. Also notified some labs didn't get done due to ordering error (ordered for future), daughter said she will hold off on getting labs done unless something changes.

## 2020-07-31 ENCOUNTER — Telehealth: Payer: Self-pay

## 2020-07-31 ENCOUNTER — Ambulatory Visit: Payer: MEDICARE | Admitting: *Deleted

## 2020-07-31 NOTE — Telephone Encounter (Signed)
Daughter [name] notified of Dr. Marliss Coots comments. Lab appt scheduled and she will drop it off in the morning

## 2020-07-31 NOTE — Telephone Encounter (Signed)
Hallucinating, talking in her sleep, unsteady gait, and other behaviors similar to what happens when she has a UTI. She wants to drop off a urine specimen tomorrow morning as she is going to assisted living on Friday. Please let [name] know ASAP if she can do this.

## 2020-07-31 NOTE — Telephone Encounter (Signed)
That is ok with me if we have enough staff in the office to run a ua and culture.  I am out of the office and may not be on my computer at the moment they drop it off- so please order/do ua and culture for confusion/mental status change and if something looks alarming please text me to let me know  thanks

## 2020-08-01 ENCOUNTER — Other Ambulatory Visit (INDEPENDENT_AMBULATORY_CARE_PROVIDER_SITE_OTHER): Payer: MEDICARE

## 2020-08-01 ENCOUNTER — Other Ambulatory Visit: Payer: Self-pay | Admitting: *Deleted

## 2020-08-01 ENCOUNTER — Other Ambulatory Visit: Payer: Self-pay | Admitting: Family Medicine

## 2020-08-01 ENCOUNTER — Other Ambulatory Visit: Payer: Self-pay

## 2020-08-01 ENCOUNTER — Emergency Department: Payer: MEDICARE

## 2020-08-01 DIAGNOSIS — Z515 Encounter for palliative care: Secondary | ICD-10-CM

## 2020-08-01 DIAGNOSIS — K219 Gastro-esophageal reflux disease without esophagitis: Secondary | ICD-10-CM | POA: Diagnosis present

## 2020-08-01 DIAGNOSIS — Z7982 Long term (current) use of aspirin: Secondary | ICD-10-CM

## 2020-08-01 DIAGNOSIS — E872 Acidosis: Secondary | ICD-10-CM | POA: Diagnosis present

## 2020-08-01 DIAGNOSIS — Z883 Allergy status to other anti-infective agents status: Secondary | ICD-10-CM

## 2020-08-01 DIAGNOSIS — R443 Hallucinations, unspecified: Secondary | ICD-10-CM

## 2020-08-01 DIAGNOSIS — F0391 Unspecified dementia with behavioral disturbance: Secondary | ICD-10-CM | POA: Diagnosis not present

## 2020-08-01 DIAGNOSIS — R410 Disorientation, unspecified: Secondary | ICD-10-CM | POA: Diagnosis not present

## 2020-08-01 DIAGNOSIS — D696 Thrombocytopenia, unspecified: Secondary | ICD-10-CM | POA: Diagnosis present

## 2020-08-01 DIAGNOSIS — I10 Essential (primary) hypertension: Secondary | ICD-10-CM | POA: Diagnosis present

## 2020-08-01 DIAGNOSIS — Z79899 Other long term (current) drug therapy: Secondary | ICD-10-CM

## 2020-08-01 DIAGNOSIS — Z66 Do not resuscitate: Secondary | ICD-10-CM | POA: Diagnosis not present

## 2020-08-01 DIAGNOSIS — Z20822 Contact with and (suspected) exposure to covid-19: Secondary | ICD-10-CM | POA: Diagnosis present

## 2020-08-01 DIAGNOSIS — E860 Dehydration: Secondary | ICD-10-CM | POA: Diagnosis not present

## 2020-08-01 DIAGNOSIS — A419 Sepsis, unspecified organism: Secondary | ICD-10-CM | POA: Diagnosis not present

## 2020-08-01 DIAGNOSIS — E785 Hyperlipidemia, unspecified: Secondary | ICD-10-CM | POA: Diagnosis present

## 2020-08-01 DIAGNOSIS — F32A Depression, unspecified: Secondary | ICD-10-CM | POA: Diagnosis present

## 2020-08-01 DIAGNOSIS — Z9049 Acquired absence of other specified parts of digestive tract: Secondary | ICD-10-CM

## 2020-08-01 DIAGNOSIS — R451 Restlessness and agitation: Secondary | ICD-10-CM | POA: Diagnosis not present

## 2020-08-01 DIAGNOSIS — F418 Other specified anxiety disorders: Secondary | ICD-10-CM | POA: Diagnosis present

## 2020-08-01 DIAGNOSIS — M810 Age-related osteoporosis without current pathological fracture: Secondary | ICD-10-CM | POA: Diagnosis present

## 2020-08-01 DIAGNOSIS — Z85038 Personal history of other malignant neoplasm of large intestine: Secondary | ICD-10-CM

## 2020-08-01 DIAGNOSIS — G8929 Other chronic pain: Secondary | ICD-10-CM | POA: Diagnosis present

## 2020-08-01 DIAGNOSIS — J309 Allergic rhinitis, unspecified: Secondary | ICD-10-CM | POA: Diagnosis present

## 2020-08-01 DIAGNOSIS — M109 Gout, unspecified: Secondary | ICD-10-CM | POA: Diagnosis present

## 2020-08-01 DIAGNOSIS — E876 Hypokalemia: Secondary | ICD-10-CM | POA: Diagnosis present

## 2020-08-01 DIAGNOSIS — R531 Weakness: Secondary | ICD-10-CM | POA: Diagnosis not present

## 2020-08-01 DIAGNOSIS — Z87440 Personal history of urinary (tract) infections: Secondary | ICD-10-CM

## 2020-08-01 DIAGNOSIS — R4182 Altered mental status, unspecified: Secondary | ICD-10-CM | POA: Diagnosis not present

## 2020-08-01 DIAGNOSIS — G4700 Insomnia, unspecified: Secondary | ICD-10-CM | POA: Diagnosis present

## 2020-08-01 DIAGNOSIS — Z881 Allergy status to other antibiotic agents status: Secondary | ICD-10-CM

## 2020-08-01 LAB — COMPREHENSIVE METABOLIC PANEL
ALT: 13 U/L (ref 0–44)
AST: 21 U/L (ref 15–41)
Albumin: 4.3 g/dL (ref 3.5–5.0)
Alkaline Phosphatase: 67 U/L (ref 38–126)
Anion gap: 14 (ref 5–15)
BUN: 21 mg/dL (ref 8–23)
CO2: 29 mmol/L (ref 22–32)
Calcium: 9.6 mg/dL (ref 8.9–10.3)
Chloride: 99 mmol/L (ref 98–111)
Creatinine, Ser: 0.87 mg/dL (ref 0.44–1.00)
GFR calc Af Amer: >60 mL/min (ref >60)
GFR calc non Af Amer: 57 mL/min — ABNORMAL LOW (ref >60)
Glucose, Bld: 106 mg/dL — ABNORMAL HIGH (ref 70–99)
Potassium: 4.1 mmol/L (ref 3.5–5.1)
Sodium: 142 mmol/L (ref 135–145)
Total Bilirubin: 1.1 mg/dL (ref 0.3–1.2)
Total Protein: 7.1 g/dL (ref 6.5–8.1)

## 2020-08-01 LAB — POC URINALSYSI DIPSTICK (AUTOMATED)
Bilirubin, UA: NEGATIVE
Blood, UA: NEGATIVE
Glucose, UA: NEGATIVE
Ketones, UA: NEGATIVE
Nitrite, UA: NEGATIVE
Protein, UA: NEGATIVE
Spec Grav, UA: 1.015 (ref 1.010–1.025)
Urobilinogen, UA: 0.2 E.U./dL
pH, UA: 6 (ref 5.0–8.0)

## 2020-08-01 LAB — PROTIME-INR
INR: 1 (ref 0.8–1.2)
Prothrombin Time: 12.4 seconds (ref 11.4–15.2)

## 2020-08-01 LAB — CBC
HCT: 45 % (ref 36.0–46.0)
Hemoglobin: 15 g/dL (ref 12.0–15.0)
MCH: 32.8 pg (ref 26.0–34.0)
MCHC: 33.3 g/dL (ref 30.0–36.0)
MCV: 98.3 fL (ref 80.0–100.0)
Platelets: 125 10*3/uL — ABNORMAL LOW (ref 150–400)
RBC: 4.58 MIL/uL (ref 3.87–5.11)
RDW: 12.8 % (ref 11.5–15.5)
WBC: 7.6 10*3/uL (ref 4.0–10.5)
nRBC: 0 % (ref 0.0–0.2)

## 2020-08-01 LAB — DIFFERENTIAL
Abs Immature Granulocytes: 0.02 10*3/uL (ref 0.00–0.07)
Basophils Absolute: 0.1 10*3/uL (ref 0.0–0.1)
Basophils Relative: 1 %
Eosinophils Absolute: 0.3 10*3/uL (ref 0.0–0.5)
Eosinophils Relative: 3 %
Immature Granulocytes: 0 %
Lymphocytes Relative: 12 %
Lymphs Abs: 0.9 10*3/uL (ref 0.7–4.0)
Monocytes Absolute: 0.6 10*3/uL (ref 0.1–1.0)
Monocytes Relative: 8 %
Neutro Abs: 5.7 10*3/uL (ref 1.7–7.7)
Neutrophils Relative %: 76 %

## 2020-08-01 LAB — APTT: aPTT: 27 seconds (ref 24–36)

## 2020-08-01 LAB — TROPONIN I (HIGH SENSITIVITY): Troponin I (High Sensitivity): 5 ng/L (ref <18)

## 2020-08-01 NOTE — Telephone Encounter (Signed)
-----   Message from [place], [place] sent at 08/01/2020 12:48 PM EDT ----- Pt's daughter [name] notified of UA, daughter is very worried because pt is hallucinating and "talking out the side of her head" daughter said she also doesn't want to walk she just wants to sit around (only sxs pt is having). Daughter didn't know if assisted living would even take her in the state she is in because pt is suppose to go there tomorrow. No one has any appts today and daughter advise to call assisted living and let them know how pt's acting and see if they want her to go ahead and come in, or if she has to follow up with PCP 1st. I did advise daughter if sxs worsen she may want to take her to ER, but I will send phone note to PCP for advise.

## 2020-08-01 NOTE — Telephone Encounter (Signed)
Thanks for letting me know  I pended a px for macrobid to sent to preferred pharmacy in case she has a uti  Go ahead and start it to see if this helps  Keep me posted - please update tomorrow

## 2020-08-01 NOTE — Telephone Encounter (Signed)
Pt is in ER right now, will hold off on sending med until PCP reviews

## 2020-08-01 NOTE — Patient Outreach (Signed)
[facility]) Care Management  08/01/2020  [name] [birth year] [id number]   CSW spoke with pt's daughter, Tobe Sos, who reports her mother will be moving into [facility] at [employer].  Pt's sister already resides there and they are hopeful this will help pt with the transition.  Pt will be admitting to the "respite/rehab" area initially while the family gets her mom set up.  Per daughter, they have told pt it is for "rehab" and will assess how she adapts for their long term planning; but are hoping for permanent placement.   CSW offered encouragement, tips for making the move easier for pt as well as validating the emotional side of the plans.    CSW reminded them to call if needs arise; daughter requests follow up in 2-3 weeks.   Eduard Clos, MSW, [place] Worker  [place] [phone]

## 2020-08-01 NOTE — ED Triage Notes (Signed)
Pt arrived to ed via pov with daughter. Pt hx of dementia. Pt daughter reports pt has been experiencing her legs giving out when standing, and confusion. When speaking work are mixed up. Denies slurred speech. Legs weakness starting this morning when she woke up. Pt daughter believed might have been uti, but states urine sample from pcp did not show any infection. Pt normally walks with walker. Pt a&o to self but unable to answer other orientation question correctly. NAD noted at this time.

## 2020-08-01 NOTE — Telephone Encounter (Signed)
Aware, thanks!

## 2020-08-02 ENCOUNTER — Emergency Department: Payer: MEDICARE

## 2020-08-02 ENCOUNTER — Inpatient Hospital Stay
Admission: EM | Admit: 2020-08-02 | Discharge: 2020-08-06 | DRG: 884 | Disposition: A | Discharge status: Skilled Nursing Facility | Payer: MEDICARE | Attending: Family Medicine | Admitting: Family Medicine

## 2020-08-02 DIAGNOSIS — Z85038 Personal history of other malignant neoplasm of large intestine: Secondary | ICD-10-CM | POA: Diagnosis not present

## 2020-08-02 DIAGNOSIS — F03918 Unspecified dementia, unspecified severity, with other behavioral disturbance: Secondary | ICD-10-CM | POA: Diagnosis present

## 2020-08-02 DIAGNOSIS — Z7401 Bed confinement status: Secondary | ICD-10-CM | POA: Diagnosis not present

## 2020-08-02 DIAGNOSIS — Z87440 Personal history of urinary (tract) infections: Secondary | ICD-10-CM | POA: Diagnosis not present

## 2020-08-02 DIAGNOSIS — F32A Depression, unspecified: Secondary | ICD-10-CM | POA: Diagnosis present

## 2020-08-02 DIAGNOSIS — I10 Essential (primary) hypertension: Secondary | ICD-10-CM | POA: Diagnosis present

## 2020-08-02 DIAGNOSIS — A419 Sepsis, unspecified organism: Secondary | ICD-10-CM | POA: Diagnosis present

## 2020-08-02 DIAGNOSIS — Z9049 Acquired absence of other specified parts of digestive tract: Secondary | ICD-10-CM | POA: Diagnosis not present

## 2020-08-02 DIAGNOSIS — R278 Other lack of coordination: Secondary | ICD-10-CM | POA: Diagnosis not present

## 2020-08-02 DIAGNOSIS — G8929 Other chronic pain: Secondary | ICD-10-CM | POA: Diagnosis present

## 2020-08-02 DIAGNOSIS — R404 Transient alteration of awareness: Secondary | ICD-10-CM | POA: Diagnosis not present

## 2020-08-02 DIAGNOSIS — Z66 Do not resuscitate: Secondary | ICD-10-CM | POA: Diagnosis present

## 2020-08-02 DIAGNOSIS — R4182 Altered mental status, unspecified: Secondary | ICD-10-CM

## 2020-08-02 DIAGNOSIS — R531 Weakness: Secondary | ICD-10-CM | POA: Diagnosis not present

## 2020-08-02 DIAGNOSIS — F0280 Dementia in other diseases classified elsewhere without behavioral disturbance: Secondary | ICD-10-CM | POA: Diagnosis not present

## 2020-08-02 DIAGNOSIS — N390 Urinary tract infection, site not specified: Secondary | ICD-10-CM | POA: Diagnosis present

## 2020-08-02 DIAGNOSIS — E872 Acidosis, unspecified: Secondary | ICD-10-CM

## 2020-08-02 DIAGNOSIS — Z743 Need for continuous supervision: Secondary | ICD-10-CM | POA: Diagnosis not present

## 2020-08-02 DIAGNOSIS — F0390 Unspecified dementia without behavioral disturbance: Secondary | ICD-10-CM

## 2020-08-02 DIAGNOSIS — E785 Hyperlipidemia, unspecified: Secondary | ICD-10-CM | POA: Diagnosis present

## 2020-08-02 DIAGNOSIS — M109 Gout, unspecified: Secondary | ICD-10-CM | POA: Diagnosis present

## 2020-08-02 DIAGNOSIS — Z20822 Contact with and (suspected) exposure to covid-19: Secondary | ICD-10-CM | POA: Diagnosis present

## 2020-08-02 DIAGNOSIS — R1312 Dysphagia, oropharyngeal phase: Secondary | ICD-10-CM | POA: Diagnosis not present

## 2020-08-02 DIAGNOSIS — R451 Restlessness and agitation: Secondary | ICD-10-CM | POA: Diagnosis not present

## 2020-08-02 DIAGNOSIS — M6281 Muscle weakness (generalized): Secondary | ICD-10-CM | POA: Diagnosis not present

## 2020-08-02 DIAGNOSIS — K219 Gastro-esophageal reflux disease without esophagitis: Secondary | ICD-10-CM | POA: Diagnosis present

## 2020-08-02 DIAGNOSIS — M1990 Unspecified osteoarthritis, unspecified site: Secondary | ICD-10-CM | POA: Diagnosis not present

## 2020-08-02 DIAGNOSIS — J309 Allergic rhinitis, unspecified: Secondary | ICD-10-CM | POA: Diagnosis present

## 2020-08-02 DIAGNOSIS — M2550 Pain in unspecified joint: Secondary | ICD-10-CM | POA: Diagnosis not present

## 2020-08-02 DIAGNOSIS — I739 Peripheral vascular disease, unspecified: Secondary | ICD-10-CM | POA: Diagnosis not present

## 2020-08-02 DIAGNOSIS — E860 Dehydration: Secondary | ICD-10-CM | POA: Diagnosis present

## 2020-08-02 DIAGNOSIS — Z515 Encounter for palliative care: Secondary | ICD-10-CM | POA: Diagnosis not present

## 2020-08-02 DIAGNOSIS — F0391 Unspecified dementia with behavioral disturbance: Secondary | ICD-10-CM | POA: Diagnosis present

## 2020-08-02 DIAGNOSIS — G9341 Metabolic encephalopathy: Secondary | ICD-10-CM | POA: Diagnosis not present

## 2020-08-02 DIAGNOSIS — R410 Disorientation, unspecified: Secondary | ICD-10-CM | POA: Diagnosis not present

## 2020-08-02 DIAGNOSIS — R9082 White matter disease, unspecified: Secondary | ICD-10-CM | POA: Diagnosis not present

## 2020-08-02 DIAGNOSIS — Z79899 Other long term (current) drug therapy: Secondary | ICD-10-CM | POA: Diagnosis not present

## 2020-08-02 DIAGNOSIS — G4700 Insomnia, unspecified: Secondary | ICD-10-CM | POA: Diagnosis present

## 2020-08-02 DIAGNOSIS — G9340 Encephalopathy, unspecified: Secondary | ICD-10-CM | POA: Diagnosis not present

## 2020-08-02 DIAGNOSIS — F418 Other specified anxiety disorders: Secondary | ICD-10-CM | POA: Diagnosis present

## 2020-08-02 DIAGNOSIS — Z7982 Long term (current) use of aspirin: Secondary | ICD-10-CM | POA: Diagnosis not present

## 2020-08-02 DIAGNOSIS — M810 Age-related osteoporosis without current pathological fracture: Secondary | ICD-10-CM | POA: Diagnosis present

## 2020-08-02 DIAGNOSIS — G319 Degenerative disease of nervous system, unspecified: Secondary | ICD-10-CM | POA: Diagnosis not present

## 2020-08-02 DIAGNOSIS — D696 Thrombocytopenia, unspecified: Secondary | ICD-10-CM | POA: Diagnosis present

## 2020-08-02 DIAGNOSIS — G629 Polyneuropathy, unspecified: Secondary | ICD-10-CM | POA: Diagnosis not present

## 2020-08-02 DIAGNOSIS — G309 Alzheimer's disease, unspecified: Secondary | ICD-10-CM | POA: Diagnosis not present

## 2020-08-02 DIAGNOSIS — E876 Hypokalemia: Secondary | ICD-10-CM | POA: Diagnosis present

## 2020-08-02 LAB — CBC
HCT: 37.8 % (ref 36.0–46.0)
Hemoglobin: 13.4 g/dL (ref 12.0–15.0)
MCH: 34 pg (ref 26.0–34.0)
MCHC: 35.4 g/dL (ref 30.0–36.0)
MCV: 95.9 fL (ref 80.0–100.0)
Platelets: 127 10*3/uL — ABNORMAL LOW (ref 150–400)
RBC: 3.94 MIL/uL (ref 3.87–5.11)
RDW: 12.7 % (ref 11.5–15.5)
WBC: 9.3 10*3/uL (ref 4.0–10.5)
nRBC: 0 % (ref 0.0–0.2)

## 2020-08-02 LAB — URINE CULTURE
MICRO NUMBER:: 11015807
Result:: NO GROWTH
SPECIMEN QUALITY:: ADEQUATE

## 2020-08-02 LAB — URINALYSIS, COMPLETE (UACMP) WITH MICROSCOPIC
Bacteria, UA: NONE SEEN
Bilirubin Urine: NEGATIVE
Glucose, UA: NEGATIVE mg/dL
Hgb urine dipstick: NEGATIVE
Ketones, ur: 5 mg/dL — AB
Leukocytes,Ua: NEGATIVE
Nitrite: NEGATIVE
Protein, ur: NEGATIVE mg/dL
Specific Gravity, Urine: 1.017 (ref 1.005–1.030)
Squamous Epithelial / HPF: NONE SEEN (ref 0–5)
pH: 6 (ref 5.0–8.0)

## 2020-08-02 LAB — CREATININE, SERUM
Creatinine, Ser: 0.79 mg/dL (ref 0.44–1.00)
GFR calc Af Amer: >60 mL/min (ref >60)
GFR calc non Af Amer: >60 mL/min (ref >60)

## 2020-08-02 LAB — LACTIC ACID, PLASMA
Lactic Acid, Venous: 2.3 mmol/L (ref 0.5–1.9)
Lactic Acid, Venous: 2.4 mmol/L (ref 0.5–1.9)
Lactic Acid, Venous: 1.6 mmol/L (ref 0.5–1.9)
Lactic Acid, Venous: 1.3 mmol/L (ref 0.5–1.9)

## 2020-08-02 LAB — RESPIRATORY PANEL BY RT PCR (FLU A&B, COVID)
Influenza A by PCR: NEGATIVE
Influenza B by PCR: NEGATIVE
SARS Coronavirus 2 by RT PCR: NEGATIVE

## 2020-08-02 LAB — PROTIME-INR
INR: 1 (ref 0.8–1.2)
Prothrombin Time: 13 seconds (ref 11.4–15.2)

## 2020-08-02 LAB — PROCALCITONIN: Procalcitonin: <0.1 ng/mL

## 2020-08-02 LAB — CORTISOL-AM, BLOOD: Cortisol - AM: 16.5 ug/dL (ref 6.7–22.6)

## 2020-08-02 MED ORDER — QUETIAPINE FUMARATE 25 MG PO TABS
25.0000 mg | ORAL_TABLET | Freq: Three times a day (TID) | ORAL | Status: DC
  Administered 2020-08-02 – 2020-08-03 (×3): 25 mg via ORAL
  Filled 2020-08-02 (×4): qty 1

## 2020-08-02 MED ORDER — ALLOPURINOL 100 MG PO TABS
100.0000 mg | ORAL_TABLET | Freq: Every day | ORAL | Status: DC
  Administered 2020-08-03 – 2020-08-06 (×4): 100 mg via ORAL
  Filled 2020-08-02 (×5): qty 1

## 2020-08-02 MED ORDER — MEMANTINE HCL 5 MG PO TABS
5.0000 mg | ORAL_TABLET | Freq: Two times a day (BID) | ORAL | Status: DC
  Administered 2020-08-02 – 2020-08-06 (×8): 5 mg via ORAL
  Filled 2020-08-02 (×9): qty 1

## 2020-08-02 MED ORDER — ENOXAPARIN SODIUM 40 MG/0.4ML ~~LOC~~ SOLN: 40.0000 mg | SUBCUTANEOUS | Status: DC

## 2020-08-02 MED ORDER — ACETAMINOPHEN 325 MG PO TABS: 650.0000 mg | ORAL_TABLET | Freq: Four times a day (QID) | ORAL | Status: DC | PRN

## 2020-08-02 MED ORDER — ASPIRIN EC 81 MG PO TBEC
81.0000 mg | DELAYED_RELEASE_TABLET | Freq: Every day | ORAL | Status: DC
  Administered 2020-08-04 – 2020-08-06 (×3): 81 mg via ORAL
  Filled 2020-08-02 (×4): qty 1

## 2020-08-02 MED ORDER — CALCIUM CARBONATE-VITAMIN D 500-200 MG-UNIT PO TABS
1.0000 | ORAL_TABLET | Freq: Every day | ORAL | Status: DC
  Administered 2020-08-04: 1 via ORAL
  Filled 2020-08-02 (×3): qty 1

## 2020-08-02 MED ORDER — SODIUM CHLORIDE 0.9 % IV SOLN
2.0000 g | Freq: Once | INTRAVENOUS | Status: AC
  Administered 2020-08-02: 2 g via INTRAVENOUS
  Filled 2020-08-02: qty 2

## 2020-08-02 MED ORDER — SODIUM CHLORIDE 0.9 % IV BOLUS
1000.0000 mL | Freq: Once | INTRAVENOUS | Status: AC
  Administered 2020-08-02: 1000 mL via INTRAVENOUS

## 2020-08-02 MED ORDER — VANCOMYCIN HCL 500 MG/100ML IV SOLN: 500.0000 mg | INTRAVENOUS | Status: DC

## 2020-08-02 MED ORDER — VITAMIN C 250 MG PO TABS
125.0000 mg | ORAL_TABLET | Freq: Every day | ORAL | Status: DC
  Filled 2020-08-02 (×2): qty 1

## 2020-08-02 MED ORDER — METRONIDAZOLE IN NACL 5-0.79 MG/ML-% IV SOLN
500.0000 mg | Freq: Once | INTRAVENOUS | Status: AC
  Administered 2020-08-02: 500 mg via INTRAVENOUS
  Filled 2020-08-02: qty 100

## 2020-08-02 MED ORDER — SODIUM CHLORIDE 0.9 % IV SOLN: INTRAVENOUS | Status: DC

## 2020-08-02 MED ORDER — SODIUM CHLORIDE 0.9 % IV BOLUS (SEPSIS)
500.0000 mL | Freq: Once | INTRAVENOUS | Status: AC
  Administered 2020-08-02: 500 mL via INTRAVENOUS

## 2020-08-02 MED ORDER — ENOXAPARIN SODIUM 30 MG/0.3ML ~~LOC~~ SOLN
30.0000 mg | SUBCUTANEOUS | Status: DC
  Filled 2020-08-02: qty 0.3

## 2020-08-02 MED ORDER — ENOXAPARIN SODIUM 40 MG/0.4ML ~~LOC~~ SOLN
40.0000 mg | SUBCUTANEOUS | Status: DC
  Administered 2020-08-02 – 2020-08-05 (×4): 40 mg via SUBCUTANEOUS
  Filled 2020-08-02 (×4): qty 0.4

## 2020-08-02 MED ORDER — VANCOMYCIN HCL IN DEXTROSE 1-5 GM/200ML-% IV SOLN
1000.0000 mg | Freq: Once | INTRAVENOUS | Status: AC
  Administered 2020-08-02: 1000 mg via INTRAVENOUS
  Filled 2020-08-02: qty 200

## 2020-08-02 MED ORDER — METOPROLOL TARTRATE 50 MG PO TABS
50.0000 mg | ORAL_TABLET | Freq: Two times a day (BID) | ORAL | Status: DC
  Administered 2020-08-02 – 2020-08-06 (×7): 50 mg via ORAL
  Filled 2020-08-02 (×8): qty 1

## 2020-08-02 MED ORDER — ACETAMINOPHEN 650 MG RE SUPP: 650.0000 mg | Freq: Four times a day (QID) | RECTAL | Status: DC | PRN

## 2020-08-02 MED ORDER — GABAPENTIN 300 MG PO CAPS
300.0000 mg | ORAL_CAPSULE | Freq: Two times a day (BID) | ORAL | Status: DC
  Administered 2020-08-02 – 2020-08-06 (×9): 300 mg via ORAL
  Filled 2020-08-02 (×10): qty 1

## 2020-08-02 MED ORDER — SODIUM CHLORIDE 0.9 % IV SOLN: 2.0000 g | Freq: Once | INTRAVENOUS | Status: DC

## 2020-08-02 MED ORDER — METRONIDAZOLE IN NACL 5-0.79 MG/ML-% IV SOLN: 500.0000 mg | Freq: Three times a day (TID) | INTRAVENOUS | Status: DC

## 2020-08-02 MED ORDER — ASCORBIC ACID 500 MG/15ML PO LIQD
100.0000 mg | Freq: Every day | ORAL | Status: DC
  Filled 2020-08-02: qty 3

## 2020-08-02 MED ORDER — ONDANSETRON HCL 4 MG/2ML IJ SOLN: 4.0000 mg | Freq: Four times a day (QID) | INTRAMUSCULAR | Status: DC | PRN

## 2020-08-02 MED ORDER — ADULT MULTIVITAMIN W/MINERALS CH
1.0000 | ORAL_TABLET | Freq: Every day | ORAL | Status: DC
  Administered 2020-08-04 – 2020-08-06 (×3): 1 via ORAL
  Filled 2020-08-02 (×4): qty 1

## 2020-08-02 MED ORDER — RISAQUAD PO CAPS
1.0000 | ORAL_CAPSULE | Freq: Every day | ORAL | Status: DC
  Administered 2020-08-03 – 2020-08-06 (×4): 1 via ORAL
  Filled 2020-08-02 (×5): qty 1

## 2020-08-02 MED ORDER — MIRTAZAPINE 15 MG PO TABS
15.0000 mg | ORAL_TABLET | Freq: Every day | ORAL | Status: DC
  Administered 2020-08-02: 15 mg via ORAL
  Filled 2020-08-02: qty 1

## 2020-08-02 MED ORDER — ONDANSETRON HCL 4 MG PO TABS: 4.0000 mg | ORAL_TABLET | Freq: Four times a day (QID) | ORAL | Status: DC | PRN

## 2020-08-02 MED ORDER — SODIUM CHLORIDE 0.9 % IV SOLN
2.0000 g | INTRAVENOUS | Status: DC
  Administered 2020-08-03 – 2020-08-04 (×2): 2 g via INTRAVENOUS
  Filled 2020-08-02 (×3): qty 2

## 2020-08-02 NOTE — Progress Notes (Signed)
Anticoagulation monitoring(Lovenox):  86yo  female ordered Lovenox 30 mg Q24h for DVT prophylaxis  Filed Weights   08/01/20 1457  Weight: 49.4 kg (109 lb)   BMI 21.29   Lab Results  Component Value Date   CREATININE 0.79 08/02/2020   CREATININE 0.87 08/01/2020   CREATININE 0.99 05/23/2020   Estimated Creatinine Clearance: 30.2 mL/min (by C-G formula based on SCr of 0.79 mg/dL). Hemoglobin & Hematocrit     Component Value Date/Time   HGB 13.4 08/02/2020 0502   HCT 37.8 08/02/2020 0502     Per Protocol for Patient with estCrcl now > 30 ml/min and BMI < 40, will transition to Lovenox 40 mg Q24h.     Chinita Greenland PharmD Clinical Pharmacist 08/02/2020

## 2020-08-02 NOTE — ED Notes (Signed)
Assumed care of pt upon being roomed, daughter at bedside. Pt denies complaints. After provider assessment daughter left for the night. Tele-sitter in room to monitor pt to prevent falls. Pt has hx of dementia and per daughter "has had worsening sun-downs." Bed alarm activated. [place] placed. Pt alert and answering questing intermittently appropriately, this is new per daughter in the past 24h.

## 2020-08-02 NOTE — Progress Notes (Signed)
PHARMACY -  BRIEF ANTIBIOTIC NOTE   Pharmacy has received consult(s) for Vancomycin and Aztreonam from an ED provider.  The patient's profile has been reviewed for ht/wt/allergies/indication/available labs.    One time order(s) placed for Vancomycin 1g and Aztreonam changed to Cefepime 2g per protocol (patient with intolerance to PCN and tolerated other cephalosporins in the past)  Further antibiotics/pharmacy consults should be ordered by admitting physician if indicated.                       Thank you, Vira Blanco 08/02/2020  3:22 AM

## 2020-08-02 NOTE — Progress Notes (Signed)
CODE SEPSIS - PHARMACY COMMUNICATION  **Broad Spectrum Antibiotics should be administered within 1 hour of Sepsis diagnosis**  Time Code Sepsis Called/Page Received: 03:14  Antibiotics Ordered: Vancomycin, Cefepime, Metronidazole  Time of 1st antibiotic administration: Metronidazole given at 03:31  Additional action taken by pharmacy:    If necessary, Name of Provider/Nurse Contacted:      Vira Blanco ,PharmD Clinical Pharmacist  08/02/2020  4:42 AM

## 2020-08-02 NOTE — ED Notes (Signed)
Sitter at bedside.

## 2020-08-02 NOTE — ED Notes (Signed)
Pt assisted to BR via w/c; pt ambulatory with standby assist; attempted to obtain cc urine but pt had BM in specimen hat; returned to lobby with daughter and updated on wait time

## 2020-08-02 NOTE — ED Provider Notes (Signed)
[facility] Emergency Department Provider Note   ____________________________________________   First MD Initiated Contact with Patient 08/02/20 0125     (approximate)  I have reviewed the triage vital signs and the nursing notes.   HISTORY  Chief Complaint Weakness and Altered Mental Status    HPI Michelle Gregory is a 86 y.o. female brought to the ED from home by her daughter with a chief complaint of weakness and confusion.  Daughter states patient has dementia at baseline but awoke yesterday morning with increased confusion and hallucinations.  Daughter states when patient gets like this she has a UTI.  Daughter dropped off a urine specimen at patient's PCPs office but was told UA did not show evidence of infection.  Daughter states patient normally ambulates well with a walker and has a good appetite and today has not eaten or drunk anything and was so weak that she had to be lifted onto the commode.  Denies fever, chest pain, shortness of breath, abdominal pain, vomiting or diarrhea.      Past Medical History:  Diagnosis Date  . Allergic rhinitis   . Anxiety   . C. difficile diarrhea 10/2017  . Cancer ([place])   . Depression   . GERD (gastroesophageal reflux disease)   . History of colon cancer   . History of recurrent UTIs   . Hyperlipidemia   . Hypertension   . Osteoarthritis   . Spinal compression fracture ([place])   . Vertigo     Patient Active Problem List   Diagnosis Date Noted  . Sepsis ([place]) 08/02/2020  . Screening-pulmonary TB 07/24/2020  . Syncope 05/23/2020  . Sundowning 05/23/2020  . Dementia with behavioral disturbance ([place])   . Acute blood loss as cause of postoperative anemia 06/27/2019  . History of pelvic fracture 06/27/2019  . Closed displaced intertrochanteric fracture of left femur ([place]) 06/21/2019  . Anxiety with depression 06/21/2019  . Diarrhea 03/24/2019  . Shingles 01/23/2019  . Fracture, ulna, distal 12/06/2018  .  Thrombocytopenia ([place]) 12/06/2018  . Dementia without behavioral disturbance ([place]) 11/22/2018  . Hand injury, left, initial encounter 09/01/2018  . H/O falling 05/03/2018  . Episodic confusion 05/03/2018  . Fall at home 03/29/2018  . Anxiety disorder 03/29/2018  . Generalized weakness 03/29/2018  . Chronic pain 02/09/2018  . Cerumen impaction 12/06/2017  . H/O Clostridium difficile infection 10/13/2017  . Recurrent UTI (urinary tract infection) 09/06/2017  . Auditory hallucinations 03/03/2017  . Female cystocele 04/13/2016  . Bowel habit changes 12/30/2015  . Compression fracture 12/30/2015  . Routine general medical examination at a health care facility 04/09/2015  . Candidal intertrigo 03/28/2014  . Bilateral hearing loss due to cerumen impaction 01/17/2014  . Encounter for Medicare annual wellness exam 03/21/2013  . Gout 09/19/2012  . Age related osteoporosis 05/05/2011  . Degenerative lumbar disc 01/20/2011  . VARICOSE VEINS, LOWER EXTREMITIES 09/04/2010  . Prediabetes 01/08/2010  . Hyperlipidemia 05/09/2008  . Essential hypertension 05/09/2008  . HEMORRHOIDS, INTERNAL 05/09/2008  . Allergic rhinitis 05/09/2008  . Mild reactive airways disease 05/09/2008  . GERD 05/09/2008  . IBS 05/09/2008  . Osteoarthritis 05/09/2008  . History of malignant neoplasm of large intestine 05/09/2008    Past Surgical History:  Procedure Laterality Date  . APPENDECTOMY    . CATARACT EXTRACTION    . COLON RESECTION    . INTRAMEDULLARY (IM) NAIL INTERTROCHANTERIC Left 06/22/2019   Procedure: INTRAMEDULLARY (IM) NAIL INTERTROCHANTRIC;  Surgeon: Rod Can, MD;  Location: [facility] ORS;  Service: Orthopedics;  Laterality: Left;  . TONSILLECTOMY      Prior to Admission medications   Medication Sig Start Date End Date Taking? Authorizing Provider  allopurinol (ZYLOPRIM) 100 MG tablet TAKE 2 TABLETS BY MOUTH  DAILY Patient taking differently: Take 100 mg by mouth daily.  01/03/20   Tower, [name], MD  Ascorbic Acid (VITAMIN C) 100 MG tablet Take 100 mg by mouth daily.    [provider]  aspirin 81 MG tablet Take 81 mg by mouth daily.      [provider]  calcium-vitamin D (CALCIUM 500 +D) 500 MG tablet Take 1 tablet by mouth daily.     [provider]  CRANBERRY PO Take 1 capsule by mouth 2 (two) times daily.    [provider]  gabapentin (NEURONTIN) 300 MG capsule Take 300 mg by mouth 2 (two) times daily.  09/06/17   [provider]  HYDROcodone-acetaminophen (NORCO/VICODIN) 5-325 MG tablet Take 1 tablet by mouth every 8 (eight) hours as needed for severe pain. Patient taking differently: Take 1 tablet by mouth every 8 (eight) hours as needed for moderate pain or severe pain.  07/07/19   Love, Ivan Anchors, PA-C  memantine (NAMENDA) 5 MG tablet Take 1 tablet (5 mg total) by mouth 2 (two) times daily. 07/24/20   Tower, [name], MD  metoprolol tartrate (LOPRESSOR) 50 MG tablet TAKE 1 TABLET BY MOUTH  TWICE DAILY Patient taking differently: Take 50 mg by mouth 2 (two) times daily.  10/02/19   Tower, [name], MD  mirtazapine (REMERON) 15 MG tablet Take 1 tablet (15 mg total) by mouth at bedtime. 07/13/19   Tower, [name], MD  multivitamin [facility]) per tablet Take 1 tablet by mouth daily.      [provider]  Probiotic Product (PROBIOTIC DAILY PO) Take 1 tablet by mouth daily.    [provider]  psyllium (REGULOID) 0.52 g capsule Take 1.56 g by mouth daily.    [provider]  QUEtiapine (SEROQUEL) 25 MG tablet Take 2 tabs by mouth every morning and one pill in the evening Patient taking differently: Take 25 mg by mouth 3 (three) times daily. Take 1 tab by mouth in the am, 2 tabs at 5 pm and 1 tab at bedtime 02/14/20   Tower, [name], MD    Allergies Amoxicillin-pot clavulanate, Keflex [cephalexin], and Septra [sulfamethoxazole-trimethoprim]  Family History  Problem Relation Age of Onset  . Prostate cancer Father   .  Heart attack Mother   . Gout Mother     Social History Social History   Tobacco Use  . Smoking status: Never Smoker  . Smokeless tobacco: Never Used  Substance Use Topics  . Alcohol use: No    Alcohol/week: 0.0 standard drinks  . Drug use: No    Review of Systems  Constitutional: Positive for generalized weakness.  No fever/chills Eyes: No visual changes. ENT: No sore throat. Cardiovascular: Denies chest pain. Respiratory: Denies shortness of breath. Gastrointestinal: No abdominal pain.  No nausea, no vomiting.  No diarrhea.  No constipation. Genitourinary: Negative for dysuria. Musculoskeletal: Negative for back pain. Skin: Negative for rash. Neurological: Positive for confusion and hallucinations.  Negative for headaches, focal weakness or numbness.   ____________________________________________   PHYSICAL EXAM:  VITAL SIGNS: ED Triage Vitals  Enc Vitals Group     BP 08/01/20 1500 (!) 163/92     Pulse Rate 08/01/20 1455 94     Resp 08/01/20 1455  18     Temp 08/01/20 1455 98.5 F (36.9 C)     Temp Source 08/01/20 1455 Oral     SpO2 08/02/20 0005 95 %     Weight 08/01/20 1457 109 lb (49.4 kg)     Height 08/01/20 1457 5' (1.524 m)     Head Circumference --      Peak Flow --      Pain Score --      Pain Loc --      Pain Edu? --      Excl. in [place]? --     Constitutional: Alert and confused.  Elderly appearing and in no acute distress. Eyes: Conjunctivae are normal. PERRL. EOMI. Head: Atraumatic. Nose: No congestion/rhinnorhea. Mouth/Throat: Mucous membranes are mildly dry.   Neck: No stridor.   Cardiovascular: Tachycardic rate, regular rhythm. Grossly normal heart sounds.  Good peripheral circulation. Respiratory: Normal respiratory effort.  No retractions. Lungs CTAB. Gastrointestinal: Soft and nontender to light or deep palpation. No distention. No abdominal bruits. No CVA tenderness. Musculoskeletal: No lower extremity tenderness nor edema.  No joint  effusions. Neurologic: Alert and oriented to self only.  Confused.  Normal speech and language. No gross focal neurologic deficits are appreciated. MAEx4. Skin:  Skin is warm, dry and intact. No rash noted.  No petechiae. Psychiatric: Mood and affect are normal. Speech and behavior are normal.  ____________________________________________   LABS (all labs ordered are listed, but only abnormal results are displayed)  Labs Reviewed  CBC - Abnormal; Notable for the following components:      Result Value   Platelets 125 (*)    All other components within normal limits  COMPREHENSIVE METABOLIC PANEL - Abnormal; Notable for the following components:   Glucose, Bld 106 (*)    GFR calc non Af Amer 57 (*)    All other components within normal limits  URINALYSIS, COMPLETE (UACMP) WITH MICROSCOPIC - Abnormal; Notable for the following components:   Color, Urine YELLOW (*)    APPearance CLEAR (*)    Ketones, ur 5 (*)    All other components within normal limits  LACTIC ACID, PLASMA - Abnormal; Notable for the following components:   Lactic Acid, Venous 2.4 (*)    All other components within normal limits  RESPIRATORY PANEL BY RT PCR (FLU A&B, COVID)  CULTURE, BLOOD (ROUTINE X 2)  CULTURE, BLOOD (ROUTINE X 2)  PROTIME-INR  APTT  DIFFERENTIAL  LACTIC ACID, PLASMA  TROPONIN I (HIGH SENSITIVITY)   ____________________________________________  EKG  ED ECG REPORT I, Marva Hendryx J, the attending physician, personally viewed and interpreted this ECG.   Date: 08/02/2020  EKG Time: 1509  Rate: 96  Rhythm: normal EKG, normal sinus rhythm  Axis: Normal  Intervals:none  ST&T Change: Nonspecific  ____________________________________________  RADIOLOGY  ED MD interpretation: No ICH,  Official radiology report(s): CT HEAD WO CONTRAST  Result Date: 08/01/2020 CLINICAL DATA:  Neuro deficit, acute, stroke suspected 86 year old dementia patient with legs giving out while standing and  confusion. EXAM: CT HEAD WITHOUT CONTRAST TECHNIQUE: Contiguous axial images were obtained from the base of the skull through the vertex without intravenous contrast. COMPARISON:  Head CT 06/21/2019 FINDINGS: Brain: Stable degree of atrophy and chronic small vessel ischemia. No intracranial hemorrhage, mass effect, or midline shift. No hydrocephalus. The basilar cisterns are patent. No evidence of territorial infarct or acute ischemia. No extra-axial or intracranial fluid collection. Vascular: Atherosclerosis of skullbase vasculature without hyperdense vessel or abnormal calcification. Skull: No fracture or focal  lesion. Sinuses/Orbits: Paranasal sinuses and mastoid air cells are clear. The visualized orbits are unremarkable. Other: None. IMPRESSION: 1. No acute intracranial abnormality. 2. Stable atrophy and chronic small vessel ischemia. Electronically Signed   By: Keith Rake M.D.   On: 08/01/2020 15:38   DG Chest Port 1 View  Result Date: 08/02/2020 CLINICAL DATA:  Legs giving out.  Weakness and confusion EXAM: PORTABLE CHEST 1 VIEW COMPARISON:  05/23/2020 FINDINGS: Normal heart size and pulmonary vascularity. Bronchiectasis, peribronchial thickening, and central interstitial fibrosis consistent with chronic bronchitis. No airspace disease or consolidation. No pleural effusions. No pneumothorax. Mediastinal contours appear intact. Ectatic aorta. Degenerative changes in the spine and shoulders. IMPRESSION: Chronic bronchitic changes in the lungs. No evidence of active pulmonary disease. Electronically Signed   By: Lucienne Capers M.D.   On: 08/02/2020 02:20    ____________________________________________   PROCEDURES  Procedure(s) performed (including Critical Care):  .1-3 Lead EKG Interpretation Performed by: Paulette Blanch, MD Authorized by: Paulette Blanch, MD     Interpretation: normal     ECG rate:  90   ECG rate assessment: normal     Rhythm: sinus rhythm     Ectopy: none      Conduction: normal   Comments:     Patient placed on cardiac monitor to evaluate for arrhythmias     ____________________________________________   INITIAL IMPRESSION / ASSESSMENT AND PLAN / ED COURSE  As part of my medical decision making, I reviewed the following data within the electronic MEDICAL RECORD NUMBER History obtained from family, Nursing notes reviewed and incorporated, Labs reviewed, EKG interpreted, Old chart reviewed, Radiograph reviewed, Discussed with admitting physician and Notes from prior ED visits        85 year old female brought to the ED for increased confusion and generalized weakness. Differential diagnosis includes, but is not limited to, alcohol, illicit or prescription medications, or other toxic ingestion; intracranial pathology such as stroke or intracerebral hemorrhage; fever or infectious causes including sepsis; hypoxemia and/or hypercarbia; uremia; trauma; endocrine related disorders such as diabetes, hypoglycemia, and thyroid-related diseases; hypertensive encephalopathy; etc.  I personally reviewed patient's records and see that she had trace leukocytes in her urine specimen yesterday provided to the PCP.  Daughter consents to in and out catheter urine; will add urine culture.  Laboratory results unremarkable.  Will check blood cultures, lactic acid, Covid swab.  Initiate IV fluids.  Anticipate hospitalization for delirium likely secondary to UTI and generalized weakness.  Daughter states patient has a history of sundowning and daughter is leaving.  Will place telesitter at bedside.   Clinical Course as of Aug 02 425  Fri Aug 02, 2020  0426 Lactate 2.4.  Broad-spectrum IV antibiotics initiated.  Case discussed with hospitalist services for admission.   [JS]    Clinical Course User Index [JS] Paulette Blanch, MD     ____________________________________________   FINAL CLINICAL IMPRESSION(S) / ED DIAGNOSES  Final diagnoses:  Altered mental status,  unspecified altered mental status type  Generalized weakness  Sepsis, due to unspecified organism, unspecified whether acute organ dysfunction present [facility])     ED Discharge Orders    None      *Please note:  Michelle Gregory was evaluated in Emergency Department on 08/02/2020 for the symptoms described in the history of present illness. She was evaluated in the context of the global COVID-19 pandemic, which necessitated consideration that the patient might be at risk for infection with the SARS-CoV-2 virus that causes COVID-19. Institutional  protocols and algorithms that pertain to the evaluation of patients at risk for COVID-19 are in a state of rapid change based on information released by regulatory bodies including the CDC and federal and state organizations. These policies and algorithms were followed during the patient's care in the ED.  Some ED evaluations and interventions may be delayed as a result of limited staffing during and the pandemic.*   Note:  This document was prepared using Dragon voice recognition software and may include unintentional dictation errors.   Paulette Blanch, MD 08/02/20 [phone]

## 2020-08-02 NOTE — ED Notes (Signed)
Patient agitated and pulled out IV and urinary catheter, was re directed and taken to toilet, patient walked with walker but is confused and agitated. Patient re directed back to bed but remains confused.

## 2020-08-02 NOTE — Progress Notes (Signed)
Pharmacy Antibiotic Note  Michelle Gregory is a 86 y.o. female admitted on 08/02/2020 with sepsis.  Pharmacy has been consulted for Cefepime and Vancomycin dosing.  Plan: Vancomycin 1g IV loading dose followed by Vancomycin 500mg  IV q24h (dosing per nomogram)  Cefepime 2g IV q24h  Follow SCr, cultures  Height: 5' (152.4 cm) Weight: 49.4 kg (109 lb) IBW/kg (Calculated) : 45.5  Temp (24hrs), Avg:98.3 F (36.8 C), Min:98 F (36.7 C), Max:98.5 F (36.9 C)  Recent Labs  Lab 08/01/20 1542 08/02/20 0145  WBC 7.6  --   CREATININE 0.87  --   LATICACIDVEN  --  2.3*  2.4*    Estimated Creatinine Clearance: 27.8 mL/min (by C-G formula based on SCr of 0.87 mg/dL).    Allergies  Allergen Reactions  . Amoxicillin-Pot Clavulanate Diarrhea    Has patient had a PCN reaction causing immediate rash, facial/tongue/throat swelling, SOB or lightheadedness with hypotension: Yes Has patient had a PCN reaction causing severe rash involving mucus membranes or skin necrosis: No Has patient had a PCN reaction that required hospitalization: No Has patient had a PCN reaction occurring within the last 10 years: No If all of the above answers are "NO", then may proceed with Cephalosporin use.   Marland Kitchen Keflex [Cephalexin] Diarrhea  . Septra [Sulfamethoxazole-Trimethoprim] Nausea Only    Antimicrobials this admission: Vancomycin 10/1 >>  Cefepime 10/1 >>  Metronidazole 10/1 >>  Dose adjustments this admission:  Microbiology results:  Thank you for allowing pharmacy to be a part of this patient's care.  Paulina Fusi, PharmD, BCPS 08/02/2020 4:46 AM

## 2020-08-02 NOTE — ED Notes (Signed)
Pt breakfast set up at bedside and pt encouraged to eat.

## 2020-08-02 NOTE — NC FL2 (Addendum)
Media LEVEL OF CARE SCREENING TOOL     IDENTIFICATION  Patient Name: Michelle Gregory Birthdate: 10-21-1923 Sex: female Admission Date (Current Location): 08/02/2020  [place] and [place] Number:  Engineering geologist and Address:  [facility], [address], [place],  [address]      Provider Number: [id number]  Attending Physician Name and Address:  Athena Masse, MD  Relative Name and Phone Number:  [name] (Daughter) [phone]    Current Level of Care: SNF Recommended Level of Care: [place] Prior Approval Number:    Date Approved/Denied:   PASRR Number: [id number]  Discharge Plan: Home    Current Diagnoses: Patient Active Problem List   Diagnosis Date Noted  . Sepsis ([place]) 08/02/2020  . Lactic acidosis 08/02/2020  . Acute metabolic encephalopathy 02/63/7858  . Screening-pulmonary TB 07/24/2020  . Syncope 05/23/2020  . Sundowning 05/23/2020  . Dementia with behavioral disturbance ([place])   . Acute blood loss as cause of postoperative anemia 06/27/2019  . History of pelvic fracture 06/27/2019  . Closed displaced intertrochanteric fracture of left femur ([place]) 06/21/2019  . Anxiety with depression 06/21/2019  . Diarrhea 03/24/2019  . Shingles 01/23/2019  . Fracture, ulna, distal 12/06/2018  . Thrombocytopenia ([place]) 12/06/2018  . Dementia without behavioral disturbance ([place]) 11/22/2018  . Hand injury, left, initial encounter 09/01/2018  . H/O falling 05/03/2018  . Episodic confusion 05/03/2018  . Fall at home 03/29/2018  . Anxiety disorder 03/29/2018  . Generalized weakness 03/29/2018  . Chronic pain 02/09/2018  . Cerumen impaction 12/06/2017  . H/O Clostridium difficile infection 10/13/2017  . Recurrent UTI (urinary tract infection) 09/06/2017  . Auditory hallucinations 03/03/2017  . Female cystocele 04/13/2016  . Bowel habit changes 12/30/2015  . Compression fracture 12/30/2015  .  Routine general medical examination at a health care facility 04/09/2015  . Candidal intertrigo 03/28/2014  . Bilateral hearing loss due to cerumen impaction 01/17/2014  . Encounter for Medicare annual wellness exam 03/21/2013  . Gout 09/19/2012  . Age related osteoporosis 05/05/2011  . Degenerative lumbar disc 01/20/2011  . VARICOSE VEINS, LOWER EXTREMITIES 09/04/2010  . Prediabetes 01/08/2010  . Hyperlipidemia 05/09/2008  . Essential hypertension 05/09/2008  . HEMORRHOIDS, INTERNAL 05/09/2008  . Allergic rhinitis 05/09/2008  . Mild reactive airways disease 05/09/2008  . GERD 05/09/2008  . IBS 05/09/2008  . Osteoarthritis 05/09/2008  . History of malignant neoplasm of large intestine 05/09/2008    Orientation RESPIRATION BLADDER Height & Weight     Self, Place  Normal Incontinent Weight: 109 lb (49.4 kg) Height:  5' (152.4 cm)  BEHAVIORAL SYMPTOMS/MOOD NEUROLOGICAL BOWEL NUTRITION STATUS      Incontinent Diet  AMBULATORY STATUS COMMUNICATION OF NEEDS Skin   Limited Assist Verbally Normal                       Personal Care Assistance Level of Assistance  Bathing, Feeding, Dressing Bathing Assistance: Limited assistance Feeding assistance: Limited assistance Dressing Assistance: Limited assistance     Functional Limitations Info             SPECIAL CARE FACTORS FREQUENCY       PT 5X per week    OT 5X per week            Contractures Contractures Info: Not present    Additional Factors Info                  Current Medications (08/02/2020):  This is the current hospital active medication list Current Facility-Administered Medications  Medication Dose Route Frequency Provider Last Rate Last Admin  . 0.9 %  sodium chloride infusion   Intravenous Continuous Athena Masse, MD 75 mL/hr at 08/02/20 0717 Rate Verify at 08/02/20 0717  . acetaminophen (TYLENOL) tablet 650 mg  650 mg Oral Q6H PRN Athena Masse, MD       Or  . acetaminophen  (TYLENOL) suppository 650 mg  650 mg Rectal Q6H PRN Athena Masse, MD      . acidophilus (RISAQUAD) capsule 1 capsule  1 capsule Oral Daily Shahmehdi, Seyed A, MD      . allopurinol (ZYLOPRIM) tablet 100 mg  100 mg Oral Daily Shahmehdi, Seyed A, MD      . aspirin EC tablet 81 mg  81 mg Oral Daily Shahmehdi, Seyed A, MD      . calcium-vitamin D (OSCAL WITH D) 500-200 MG-UNIT per tablet 1 tablet  1 tablet Oral Daily Shahmehdi, Seyed A, MD      . Derrill Memo ON 08/03/2020] ceFEPIme (MAXIPIME) 2 g in sodium chloride 0.9 % 100 mL IVPB  2 g Intravenous Q24H Vira Blanco, RPH      . enoxaparin (LOVENOX) injection 40 mg  40 mg Subcutaneous Q24H Judd Gaudier V, MD      . gabapentin (NEURONTIN) capsule 300 mg  300 mg Oral BID Shahmehdi, Seyed A, MD      . memantine (NAMENDA) tablet 5 mg  5 mg Oral BID Shahmehdi, Seyed A, MD      . metoprolol tartrate (LOPRESSOR) tablet 50 mg  50 mg Oral BID Shahmehdi, Seyed A, MD      . mirtazapine (REMERON) tablet 15 mg  15 mg Oral QHS Shahmehdi, Seyed A, MD      . multivitamin with minerals tablet 1 tablet  1 tablet Oral Daily Shahmehdi, Seyed A, MD      . ondansetron (ZOFRAN) tablet 4 mg  4 mg Oral Q6H PRN Athena Masse, MD       Or  . ondansetron (ZOFRAN) injection 4 mg  4 mg Intravenous Q6H PRN Athena Masse, MD      . QUEtiapine (SEROQUEL) tablet 25 mg  25 mg Oral TID Shahmehdi, Seyed A, MD      . vitamin C (ASCORBIC ACID) tablet 125 mg  125 mg Oral Daily Shahmehdi, Valeria Batman, MD       Current Outpatient Medications  Medication Sig Dispense Refill  . allopurinol (ZYLOPRIM) 100 MG tablet TAKE 2 TABLETS BY MOUTH  DAILY (Patient taking differently: Take 100 mg by mouth daily. ) 180 tablet 1  . Ascorbic Acid (VITAMIN C) 100 MG tablet Take 100 mg by mouth daily.    Marland Kitchen aspirin 81 MG tablet Take 81 mg by mouth daily.      . calcium-vitamin D (CALCIUM 500 +D) 500 MG tablet Take 1 tablet by mouth daily.     Marland Kitchen CRANBERRY PO Take 1 capsule by mouth 2 (two) times daily.    Marland Kitchen  gabapentin (NEURONTIN) 300 MG capsule Take 300 mg by mouth 2 (two) times daily.   6  . HYDROcodone-acetaminophen (NORCO/VICODIN) 5-325 MG tablet Take 1 tablet by mouth every 8 (eight) hours as needed for severe pain. (Patient taking differently: Take 1 tablet by mouth every 8 (eight) hours as needed for moderate pain or severe pain. ) 30 tablet 0  . memantine (NAMENDA) 5 MG tablet Take 1 tablet (5 mg total)  by mouth 2 (two) times daily. 1 tablet 0  . metoprolol tartrate (LOPRESSOR) 50 MG tablet TAKE 1 TABLET BY MOUTH  TWICE DAILY (Patient taking differently: Take 50 mg by mouth 2 (two) times daily. ) 180 tablet 3  . mirtazapine (REMERON) 15 MG tablet Take 1 tablet (15 mg total) by mouth at bedtime. 30 tablet 11  . multivitamin (THERAGRAN) per tablet Take 1 tablet by mouth daily.      . Probiotic Product (PROBIOTIC DAILY PO) Take 1 tablet by mouth daily.    . psyllium (REGULOID) 0.52 g capsule Take 1.56 g by mouth daily.    . QUEtiapine (SEROQUEL) 25 MG tablet Take 2 tabs by mouth every morning and one pill in the evening (Patient taking differently: Take 25 mg by mouth 3 (three) times daily. Take 1 tab by mouth in the am, 2 tabs at 5 pm and 1 tab at bedtime) 90 tablet 5     Discharge Medications: Please see discharge summary for a list of discharge medications.  Relevant Imaging Results:  Relevant Lab Results:   Additional Information SS# [id number]  Adelene Amas, LCSWA

## 2020-08-02 NOTE — ED Notes (Signed)
Attempt to give pt PO meds in applesauce this AM, pt refusing PO take

## 2020-08-02 NOTE — TOC Progression Note (Signed)
Transition of Care [facility]) - Progression Note    Patient Details  Name: Michelle Gregory MRN: [id number] Date of Birth: 04-30-1923  Transition of Care [facility]) CM/SW [place], [place] Phone Number: [phone] 08/02/2020, 2:46 PM  Clinical Narrative:     PT/OT recommended SNF placement.  CSW called and left voicemail updating patient's daughter about recommendation. [facility] is preferred SNF placement.   Expected Discharge Plan: Skilled Nursing Facility Barriers to Discharge: SNF Pending bed offer  Expected Discharge Plan and Services Expected Discharge Plan: [place] In-house Referral: Clinical Social Work                                             Social Determinants of Health (SDOH) Interventions    Readmission Risk Interventions No flowsheet data found.

## 2020-08-02 NOTE — ED Notes (Signed)
Dr. Beather Arbour made aware lactic is 2.4

## 2020-08-02 NOTE — Progress Notes (Signed)
PROGRESS NOTE    Patient: Michelle Gregory                            PCP: Abner Greenspan, MD                    DOB: October 03, 1923            DOA: 08/02/2020 [id number]             DOS: 08/02/2020, 7:48 AM   LOS: 0 days   Date of Service: The patient was seen and examined on 08/02/2020  Subjective:   The patient was seen and examined this Am. Remained confused, lethargic....  Denies having any pain denies any shortness of breath Per nursing staff she has been stable no agitation  Brief Narrative:   Michelle Gregory. Michelle Gregory who is a 86 year old female with history of dementia, recurrent UTIs, HTN, HLD presenting with 2-day history of confusion with hallucinations, typical for her when she has UTIs.  She was admitted as she met the SIRS criteria, with lactic acidosis.  No obvious source of infection. Did not met the sepsis criteria. Acute metabolic encephalopathy in the setting of dementia.    Admitted for further work-up   Assessment & Plan:   Active Problems:   Recurrent UTI (urinary tract infection)   Generalized weakness   Dementia without behavioral disturbance (HCC)   Sepsis (HCC)   Lactic acidosis   Acute metabolic encephalopathy    Acute metabolic encephalopathy in the setting of dementia Possible sepsis versus lactic acidosis - Recurrent UTI (urinary tract infection) -Patient remained confused -Lactic acidosis, likely due to dehydration poor p.o. intake, no signs of overt infection Sepsis ruled out, afebrile normotensive no leukocytosis mildly tachypneic -Lactic acid 2.4 >> 1.6, 1.3 -WBC 7.6, 9.3, -UA; clean never any bacteria, negative for any nitrites trace of leukocyte esterase WBC 0-5  -Patient presents with altered mental status typical of when she has UTIs -Patient not quite meeting sepsis criteria except for isolated lactic acid elevation.    -She has been started on broad-spectrum antibiotics likely will be tapered down and discontinued We will discontinue  vancomycin and Flagyl, continue cefepime for today  -Follow with cultures -CT abdomen and pelvis-  -CT of the head; never any acute findings -Chest x-ray negative for any acute changes chronic -   Generalized weakness -Patient reported to be very weak unable to walk which is not her baseline -Possibly related to acute infection -Fall and aspiration precautions -PT OT consult -Likely will need SNF versus ALS upon discharge    Dementia without behavioral disturbance (HCC) -No acute behavioral disturbance -Remained very confused today, follows some commands   Hypertension -BP remained stable, continue home meds  Hyperlipidemia -Stable, continue statins   DVT prophylaxis: Lovenox  Code Status: DNR Family Communication: Daughter Disposition Plan:  Likely assisted living versus SNF Consults called: none  Status:At the time of admission, it appears that the appropriate admission status for this patient is INPATIENT.      Nutritional status:         Cultures; Blood Cultures x 2 >> NGT Urine Culture  >>> NGT    Antimicrobials: 08/02/2020  >> IV antibiotics cefepime/metronidazole/vancomycin >> discontinued vancomycin and metronidazole 08/02/2020 continue cefepime   Procedures:   No admission procedures for hospital encounter.     Antimicrobials:  Anti-infectives (From admission, onward)   Start     Dose/Rate Route Frequency Ordered  Stop   08/03/20 0600  ceFEPIme (MAXIPIME) 2 g in sodium chloride 0.9 % 100 mL IVPB        2 g 200 mL/hr over 30 Minutes Intravenous Every 24 hours 08/02/20 0440     08/03/20 0400  vancomycin (VANCOREADY) IVPB 500 mg/100 mL        500 mg 100 mL/hr over 60 Minutes Intravenous Every 24 hours 08/02/20 0440     08/02/20 1200  metroNIDAZOLE (FLAGYL) IVPB 500 mg        500 mg 100 mL/hr over 60 Minutes Intravenous Every 8 hours 08/02/20 0430     08/02/20 0330  ceFEPIme (MAXIPIME) 2 g in sodium chloride 0.9 % 100 mL IVPB        2  g 200 mL/hr over 30 Minutes Intravenous  Once 08/02/20 0322 08/02/20 0541   08/02/20 0315  aztreonam (AZACTAM) 2 g in sodium chloride 0.9 % 100 mL IVPB  Status:  Discontinued        2 g 200 mL/hr over 30 Minutes Intravenous  Once 08/02/20 0314 08/02/20 0322   08/02/20 0315  metroNIDAZOLE (FLAGYL) IVPB 500 mg        500 mg 100 mL/hr over 60 Minutes Intravenous  Once 08/02/20 0314 08/02/20 0502   08/02/20 0315  vancomycin (VANCOCIN) IVPB 1000 mg/200 mL premix        1,000 mg 200 mL/hr over 60 Minutes Intravenous  Once 08/02/20 0314 08/02/20 0502       Medication:  . enoxaparin (LOVENOX) injection  30 mg Subcutaneous Q24H    acetaminophen **OR** acetaminophen, ondansetron **OR** ondansetron (ZOFRAN) IV   Objective:   Vitals:   08/02/20 0600 08/02/20 0700 08/02/20 0718 08/02/20 0730  BP: (!) 168/94 (!) 155/108  (!) 158/93  Pulse: (!) 104 82 100 98  Resp: $Remo'17 20 20 'dyUlu$ (!) 22  Temp:      TempSrc:      SpO2: 93% 96% 98% 97%  Weight:      Height:        Intake/Output Summary (Last 24 hours) at 08/02/2020 0748 Last data filed at 08/02/2020 0541 Gross per 24 hour  Intake 1600 ml  Output --  Net 1600 ml   Filed Weights   08/01/20 1457  Weight: 49.4 kg     Examination:   Physical Exam  Constitution: Awake, very confused Psychiatric: Confused, not agitated HEENT: Normocephalic, PERRL, otherwise with in Normal limits  Chest:Chest symmetric Cardio vascular:  S1/S2, RRR, No murmure, No Rubs or Gallops  pulmonary: Clear to auscultation bilaterally, respirations unlabored, negative wheezes / crackles Abdomen: Soft, non-tender, non-distended, bowel sounds,no masses, no organomegaly Muscular skeletal:  Severe generalized weaknesses Limited exam - in bed, able to move all 4 extremities, Normal strength,  Neuro: CNII-XII intact. , normal motor and sensation, reflexes intact  Extremities: No pitting edema lower extremities, +2 pulses  Skin: Dry, warm to touch, negative for any  Rashes, No open wounds Wounds: per nursing documentation    ------------------------------------------------------------------------------------------------------------------------------------------    LABs:  CBC Latest Ref Rng & Units 08/02/2020 08/01/2020 05/23/2020  WBC 4.0 - 10.5 K/uL 9.3 7.6 8.1  Hemoglobin 12.0 - 15.0 g/dL 13.4 15.0 14.0  Hematocrit 36 - 46 % 37.8 45.0 43.1  Platelets 150 - 400 K/uL 127(L) 125(L) PLATELET CLUMPS NOTED ON SMEAR, UNABLE TO ESTIMATE   CMP Latest Ref Rng & Units 08/02/2020 08/01/2020 05/23/2020  Glucose 70 - 99 mg/dL - 106(H) 94  BUN 8 - 23 mg/dL - 21 21  Creatinine 0.44 - 1.00 mg/dL 0.79 0.87 0.99  Sodium 135 - 145 mmol/L - 142 139  Potassium 3.5 - 5.1 mmol/L - 4.1 4.1  Chloride 98 - 111 mmol/L - 99 97(L)  CO2 22 - 32 mmol/L - 29 29  Calcium 8.9 - 10.3 mg/dL - 9.6 9.7  Total Protein 6.5 - 8.1 g/dL - 7.1 -  Total Bilirubin 0.3 - 1.2 mg/dL - 1.1 -  Alkaline Phos 38 - 126 U/L - 67 -  AST 15 - 41 U/L - 21 -  ALT 0 - 44 U/L - 13 -       Micro Results Recent Results (from the past 240 hour(s))  Respiratory Panel by RT PCR (Flu A&B, Covid) - Nasopharyngeal Swab     Status: None   Collection Time: 08/02/20 12:07 AM   Specimen: Nasopharyngeal Swab  Result Value Ref Range Status   SARS Coronavirus 2 by RT PCR NEGATIVE NEGATIVE Final    Comment: (NOTE) SARS-CoV-2 target nucleic acids are NOT DETECTED.  The SARS-CoV-2 RNA is generally detectable in upper respiratoy specimens during the acute phase of infection. The lowest concentration of SARS-CoV-2 viral copies this assay can detect is 131 copies/mL. A negative result does not preclude SARS-Cov-2 infection and should not be used as the sole basis for treatment or other patient management decisions. A negative result may occur with  improper specimen collection/handling, submission of specimen other than nasopharyngeal swab, presence of viral mutation(s) within the areas targeted by this assay,  and inadequate number of viral copies (<131 copies/mL). A negative result must be combined with clinical observations, patient history, and epidemiological information. The expected result is Negative.  Fact Sheet for Patients:  PinkCheek.be  Fact Sheet for Healthcare Providers:  GravelBags.it  This test is no t yet approved or cleared by the [place] FDA and  has been authorized for detection and/or diagnosis of SARS-CoV-2 by FDA under an Emergency Use Authorization (EUA). This EUA will remain  in effect (meaning this test can be used) for the duration of the COVID-19 declaration under Section 564(b)(1) of the Act, 21 U.S.C. section 360bbb-3(b)(1), unless the authorization is terminated or revoked sooner.     Influenza A by PCR NEGATIVE NEGATIVE Final   Influenza B by PCR NEGATIVE NEGATIVE Final    Comment: (NOTE) The Xpert Xpress SARS-CoV-2/FLU/RSV assay is intended as an aid in  the diagnosis of influenza from Nasopharyngeal swab specimens and  should not be used as a sole basis for treatment. Nasal washings and  aspirates are unacceptable for Xpert Xpress SARS-CoV-2/FLU/RSV  testing.  Fact Sheet for Patients: PinkCheek.be  Fact Sheet for Healthcare Providers: GravelBags.it  This test is not yet approved or cleared by the [place] FDA and  has been authorized for detection and/or diagnosis of SARS-CoV-2 by  FDA under an Emergency Use Authorization (EUA). This EUA will remain  in effect (meaning this test can be used) for the duration of the  Covid-19 declaration under Section 564(b)(1) of the Act, 21  U.S.C. section 360bbb-3(b)(1), unless the authorization is  terminated or revoked. Performed at [facility], [address] [place],  [address]   Culture, blood (routine x 2)     Status: None (Preliminary result)   Collection  Time: 08/02/20  1:45 AM   Specimen: BLOOD  Result Value Ref Range Status   Specimen Description BLOOD BLOOD RIGHT FOREARM  Final   Special Requests   Final    BOTTLES DRAWN AEROBIC AND  ANAEROBIC Blood Culture adequate volume   Culture   Final    NO GROWTH < 12 HOURS Performed at [facility], [address] [place], [place] [address]    Report Status PENDING  Incomplete  Culture, blood (routine x 2)     Status: None (Preliminary result)   Collection Time: 08/02/20  1:45 AM   Specimen: BLOOD  Result Value Ref Range Status   Specimen Description BLOOD BLOOD RIGHT FOREARM  Final   Special Requests   Final    BOTTLES DRAWN AEROBIC AND ANAEROBIC Blood Culture adequate volume   Culture   Final    NO GROWTH < 12 HOURS Performed at [facility], [address], [place], [place] [address]    Report Status PENDING  Incomplete    Radiology Reports CT HEAD WO CONTRAST  Result Date: 08/01/2020 CLINICAL DATA:  Neuro deficit, acute, stroke suspected 86 year old dementia patient with legs giving out while standing and confusion. EXAM: CT HEAD WITHOUT CONTRAST TECHNIQUE: Contiguous axial images were obtained from the base of the skull through the vertex without intravenous contrast. COMPARISON:  Head CT 06/21/2019 FINDINGS: Brain: Stable degree of atrophy and chronic small vessel ischemia. No intracranial hemorrhage, mass effect, or midline shift. No hydrocephalus. The basilar cisterns are patent. No evidence of territorial infarct or acute ischemia. No extra-axial or intracranial fluid collection. Vascular: Atherosclerosis of skullbase vasculature without hyperdense vessel or abnormal calcification. Skull: No fracture or focal lesion. Sinuses/Orbits: Paranasal sinuses and mastoid air cells are clear. The visualized orbits are unremarkable. Other: None. IMPRESSION: 1. No acute intracranial abnormality. 2. Stable atrophy and chronic small vessel ischemia. Electronically Signed   By:  Keith Rake M.D.   On: 08/01/2020 15:38   DG Chest Port 1 View  Result Date: 08/02/2020 CLINICAL DATA:  Legs giving out.  Weakness and confusion EXAM: PORTABLE CHEST 1 VIEW COMPARISON:  05/23/2020 FINDINGS: Normal heart size and pulmonary vascularity. Bronchiectasis, peribronchial thickening, and central interstitial fibrosis consistent with chronic bronchitis. No airspace disease or consolidation. No pleural effusions. No pneumothorax. Mediastinal contours appear intact. Ectatic aorta. Degenerative changes in the spine and shoulders. IMPRESSION: Chronic bronchitic changes in the lungs. No evidence of active pulmonary disease. Electronically Signed   By: Lucienne Capers M.D.   On: 08/02/2020 02:20    SIGNED: Deatra James, MD, FACP, FHM. [facility],  Pager (please use [email or web address] to page/text)  If 7PM-7AM, please contact night-coverage [email or web address] [email or web address] [facility] 08/02/2020, 7:48 AM

## 2020-08-02 NOTE — ED Notes (Addendum)
Verified telesitter working.  Attempt to assist pt drink PO fluids, pt refused. Pt clean and dry at this time.

## 2020-08-02 NOTE — Evaluation (Signed)
Occupational Therapy Evaluation Patient Details Name: Michelle Gregory MRN: [id number] DOB: 1923-06-06 Today's Date: 08/02/2020    History of Present Illness 86 y.o. female with medical history significant for dementia, recurrent UTIs, HTN, HLD who presented to the emergency room with a 2 to 3-day history of altered mental status described as confusion and hallucinations, typical for her when she has UTIs.  She has had no fever, chills, cough or shortness of breath, no nausea or vomiting, abdominal pain or change in bowel habits.  History is taken from daughter over the phone due to patient's dementia.  Over the past 24 hours patient developed protracted weakness, with difficulty getting out of bed and walking which is her usual.Pt being treated for sepsis at this time.   Clinical Impression   Patient presenting with decreased I in self care, balance, endurance, strength, and safety awareness. Pt very confused and oriented to self only with history of dementia. No family present to provide information. Per chart review, pt lives alone but next to family PTA. She spends the night at daughter home and stays at her house during the day. More recently, pt planning on going to ALF per chart.  Patient currently functioning at min - mod A +2 for functional mobility. Pt fatigues very quickly during session.  Patient will benefit from acute OT to increase overall independence in the areas of ADLs, functional mobility, and safety awareness in order to safely discharge to next venue of care.    Follow Up Recommendations  SNF;Supervision/Assistance - 24 hour    Equipment Recommendations  Other (comment) (defer to next venue of care)       Precautions / Restrictions Precautions Precautions: Fall      Mobility Bed Mobility Overal bed mobility: Needs Assistance Bed Mobility: Supine to Sit;Sit to Supine     Supine to sit: Min assist;+2 for physical assistance Sit to supine: Mod assist;+2 for physical  assistance   General bed mobility comments: cuing for safety and technique.  Transfers Overall transfer level: Needs assistance Equipment used: 2 person hand held assist Transfers: Sit to/from Stand Sit to Stand: Mod assist;+2 physical assistance              Balance Overall balance assessment: Needs assistance Sitting-balance support: Feet unsupported;Bilateral upper extremity supported Sitting balance-Leahy Scale: Poor Sitting balance - Comments: min - mod A for static sitting balance     Standing balance-Leahy Scale: Zero Standing balance comment: +2 assist for safety          ADL either performed or assessed with clinical judgement   ADL Overall ADL's : Needs assistance/impaired       General ADL Comments: Pt needing total A to don B socks and OT anticipated min A for UB self care and mod - max A for LB self care. Pt needing min - mod +2 assistance for bed mobility and sit <>stand this session.     Vision Patient Visual Report: No change from baseline              Pertinent Vitals/Pain Pain Assessment: Faces Faces Pain Scale: Hurts a little bit Pain Location: generalized with mobility Pain Descriptors / Indicators: Discomfort Pain Intervention(s): Limited activity within patient's tolerance;Monitored during session;Repositioned     Hand Dominance Right   Extremity/Trunk Assessment Upper Extremity Assessment Upper Extremity Assessment: Generalized weakness   Lower Extremity Assessment Lower Extremity Assessment: Generalized weakness   Cervical / Trunk Assessment Cervical / Trunk Assessment: Kyphotic   Communication Communication Communication:  HOH   Cognition Arousal/Alertness: Awake/alert Behavior During Therapy: WFL for tasks assessed/performed Overall Cognitive Status: History of cognitive impairments - at baseline    General Comments: per chart, pt with hx of dementia. Pt very confused throughout session and oriented to self only. She  reports she lives at home with her mother.              Home Living Family/patient expects to be discharged to:: Private residence Living Arrangements: Alone;Children Available Help at Discharge: Family Type of Home: House Home Access: Stairs to enter [employer] of Steps: 5 Entrance Stairs-Rails: Right;Left;Can reach both Home Layout: One level     Bathroom Shower/Tub: Teacher, early years/pre: Handicapped height Bathroom Accessibility: Yes How Accessible: Accessible via walker Home Equipment: Cane - single point;Walker - 2 wheels;Walker - 4 wheels;Shower seat;Grab bars - tub/shower;Grab bars - toilet   Additional Comments: Information obtained via chart review. Pt's daughter and son in law live next door. Apparently, pt was transitioning to ALF prior to admission.      Prior Functioning/Environment Level of Independence: Independent;Needs assistance        Comments: per chart review, pt stays with daughter overnight at daughter's home and brought back to her hourse during the day. Pt is poor historian and reports she lives with her momma (pt is 86 y/o)        OT Problem List: Decreased strength;Decreased coordination;Decreased cognition;Decreased activity tolerance;Decreased safety awareness;Decreased knowledge of use of DME or AE;Impaired balance (sitting and/or standing)      OT Treatment/Interventions: Self-care/ADL training;Therapeutic exercise;Therapeutic activities;Neuromuscular education;Energy conservation;Patient/family education;DME and/or AE instruction;Balance training    OT Goals(Current goals can be found in the care plan section) Acute Rehab OT Goals Patient Stated Goal: none stated OT Goal Formulation: With patient Time For Goal Achievement: 08/16/20 Potential to Achieve Goals: Good ADL Goals Pt Will Perform Grooming: with supervision;standing Pt Will Perform Lower Body Dressing: with min assist;sit to/from stand Pt Will Transfer  to Toilet: with min assist;ambulating;bedside commode Pt Will Perform Toileting - Clothing Manipulation and hygiene: with min assist;sit to/from stand  OT Frequency: Min 1X/week   Barriers to D/C: Other (comment)  none known at this time          AM-PAC OT "6 Clicks" Daily Activity     Outcome Measure Help from another person eating meals?: A Little Help from another person taking care of personal grooming?: A Little Help from another person toileting, which includes using toliet, bedpan, or urinal?: Total Help from another person bathing (including washing, rinsing, drying)?: Total Help from another person to put on and taking off regular upper body clothing?: A Lot Help from another person to put on and taking off regular lower body clothing?: Total 6 Click Score: 11   End of Session Nurse Communication: Mobility status  Activity Tolerance: Patient tolerated treatment well Patient left: in bed;with call bell/phone within reach;with bed alarm set  OT Visit Diagnosis: Muscle weakness (generalized) (M62.81);Unsteadiness on feet (R26.81)                Time: 3295-1884 OT Time Calculation (min): 20 min Charges:  OT General Charges $OT Visit: 1 Visit OT Evaluation $OT Eval Moderate Complexity: 1 Mod OT Treatments $Self Care/Home Management : 8-22 mins  Darleen Crocker, MS, OTR/L , CBIS ascom [phone]  08/02/20, 12:27 PM

## 2020-08-02 NOTE — H&P (Signed)
History and Physical    Michelle Gregory BPZ:[id number] DOB: 04/15/1923 DOA: 08/02/2020  PCP: Abner Greenspan, MD   Patient coming from: Home  I have personally briefly reviewed patient's old medical records in [place]  Chief Complaint: Altered mental status  HPI: Michelle Gregory is a 86 y.o. female with medical history significant for dementia, recurrent UTIs, HTN, HLD who presented to the emergency room with a 2 to 3-day history of altered mental status described as confusion and hallucinations, typical for her when she has UTIs.  She has had no fever, chills, cough or shortness of breath, no nausea or vomiting, abdominal pain or change in bowel habits.  History is taken from daughter over the phone due to patient's dementia.  Over the past 24 hours patient developed protracted weakness, with difficulty getting out of bed and walking which is her usual. ED Course: On arrival in the ER she was afebrile mildly tachycardic at 94 with BP 163/92.  Blood work essentially unremarkable except for lactic acid of 2.4 and with normal WBC.  She had mild thrombocytopenia of 125,000.  Urinalysis WNL.  CT head showed no acute findings.  Chest x-ray showed no active pulmonary disease EKG as reviewed by me : Normal sinus rhythm, rate 96 with nonspecific ST-T wave changes Patient was started on treatment in the ER for sepsis of unknown source.  Hospitalist consulted for admission.   Review of Systems: Unable to obtain due to dementia  Past Medical History:  Diagnosis Date  . Allergic rhinitis   . Anxiety   . C. difficile diarrhea 10/2017  . Cancer ([place])   . Depression   . GERD (gastroesophageal reflux disease)   . History of colon cancer   . History of recurrent UTIs   . Hyperlipidemia   . Hypertension   . Osteoarthritis   . Spinal compression fracture ([place])   . Vertigo     Past Surgical History:  Procedure Laterality Date  . APPENDECTOMY    . CATARACT EXTRACTION    . COLON RESECTION      . INTRAMEDULLARY (IM) NAIL INTERTROCHANTERIC Left 06/22/2019   Procedure: INTRAMEDULLARY (IM) NAIL INTERTROCHANTRIC;  Surgeon: Rod Can, MD;  Location: [facility];  Service: Orthopedics;  Laterality: Left;  . TONSILLECTOMY       reports that she has never smoked. She has never used smokeless tobacco. She reports that she does not drink alcohol and does not use drugs.  Allergies  Allergen Reactions  . Amoxicillin-Pot Clavulanate Diarrhea    Has patient had a PCN reaction causing immediate rash, facial/tongue/throat swelling, SOB or lightheadedness with hypotension: Yes Has patient had a PCN reaction causing severe rash involving mucus membranes or skin necrosis: No Has patient had a PCN reaction that required hospitalization: No Has patient had a PCN reaction occurring within the last 10 years: No If all of the above answers are "NO", then may proceed with Cephalosporin use.   Marland Kitchen Keflex [Cephalexin] Diarrhea  . Septra [Sulfamethoxazole-Trimethoprim] Nausea Only    Family History  Problem Relation Age of Onset  . Prostate cancer Father   . Heart attack Mother   . Gout Mother       Prior to Admission medications   Medication Sig Start Date End Date Taking? Authorizing Provider  allopurinol (ZYLOPRIM) 100 MG tablet TAKE 2 TABLETS BY MOUTH  DAILY Patient taking differently: Take 100 mg by mouth daily.  01/03/20   Tower, [name], MD  Ascorbic Acid (VITAMIN C)  100 MG tablet Take 100 mg by mouth daily.    [provider]  aspirin 81 MG tablet Take 81 mg by mouth daily.      [provider]  calcium-vitamin D (CALCIUM 500 +D) 500 MG tablet Take 1 tablet by mouth daily.     [provider]  CRANBERRY PO Take 1 capsule by mouth 2 (two) times daily.    [provider]  gabapentin (NEURONTIN) 300 MG capsule Take 300 mg by mouth 2 (two) times daily.  09/06/17   [provider]  HYDROcodone-acetaminophen (NORCO/VICODIN) 5-325 MG tablet Take 1 tablet  by mouth every 8 (eight) hours as needed for severe pain. Patient taking differently: Take 1 tablet by mouth every 8 (eight) hours as needed for moderate pain or severe pain.  07/07/19   Love, Ivan Anchors, PA-C  memantine (NAMENDA) 5 MG tablet Take 1 tablet (5 mg total) by mouth 2 (two) times daily. 07/24/20   Tower, [name], MD  metoprolol tartrate (LOPRESSOR) 50 MG tablet TAKE 1 TABLET BY MOUTH  TWICE DAILY Patient taking differently: Take 50 mg by mouth 2 (two) times daily.  10/02/19   Tower, [name], MD  mirtazapine (REMERON) 15 MG tablet Take 1 tablet (15 mg total) by mouth at bedtime. 07/13/19   Tower, [name], MD  multivitamin [facility]) per tablet Take 1 tablet by mouth daily.      [provider]  Probiotic Product (PROBIOTIC DAILY PO) Take 1 tablet by mouth daily.    [provider]  psyllium (REGULOID) 0.52 g capsule Take 1.56 g by mouth daily.    [provider]  QUEtiapine (SEROQUEL) 25 MG tablet Take 2 tabs by mouth every morning and one pill in the evening Patient taking differently: Take 25 mg by mouth 3 (three) times daily. Take 1 tab by mouth in the am, 2 tabs at 5 pm and 1 tab at bedtime 02/14/20   Abner Greenspan, MD    Physical Exam: Vitals:   08/01/20 1500 08/02/20 0005 08/02/20 0209 08/02/20 0300  BP: (!) 163/92 138/89 (!) 148/95 (!) 186/98  Pulse:  (!) 110 (!) 106 (!) 101  Resp:  18 20 20   Temp:  98 F (36.7 C)    TempSrc:  Oral    SpO2:  95% 96% 98%  Weight:      Height:         Vitals:   08/01/20 1500 08/02/20 0005 08/02/20 0209 08/02/20 0300  BP: (!) 163/92 138/89 (!) 148/95 (!) 186/98  Pulse:  (!) 110 (!) 106 (!) 101  Resp:  18 20 20   Temp:  98 F (36.7 C)    TempSrc:  Oral    SpO2:  95% 96% 98%  Weight:      Height:          Constitutional:  Lethargic, disoriented.  Patient easily arousable but answers irrelevant to questions asked HEENT:      Head: Normocephalic and atraumatic.         Eyes: PERLA, EOMI, Conjunctivae are  normal. Sclera is non-icteric.       Mouth/Throat: Mucous membranes are moist.       Neck: Supple with no signs of meningismus. Cardiovascular: Regular rate and rhythm. No murmurs, gallops, or rubs. 2+ symmetrical distal pulses are present . No JVD. No LE edema Respiratory: Respiratory effort normal .Lungs sounds clear bilaterally. No wheezes, crackles, or rhonchi.  Gastrointestinal: Soft, tender in epigastrium, and non distended with  positive bowel sounds. No rebound or guarding. Genitourinary: No CVA tenderness. Musculoskeletal: Nontender with normal range of motion in all extremities. No cyanosis, or erythema of extremities. Neurologic:  Face is symmetric. Moving all extremities. No gross focal neurologic deficits . Skin: Skin is warm, dry.  No rash or ulcers Psychiatric: Mood and affect are normal    Labs on Admission: I have personally reviewed following labs and imaging studies  CBC: Recent Labs  Lab 08/01/20 1542  WBC 7.6  NEUTROABS 5.7  HGB 15.0  HCT 45.0  MCV 98.3  PLT 656*   Basic Metabolic Panel: Recent Labs  Lab 08/01/20 1542  NA 142  K 4.1  CL 99  CO2 29  GLUCOSE 106*  BUN 21  CREATININE 0.87  CALCIUM 9.6   GFR: Estimated Creatinine Clearance: 27.8 mL/min (by C-G formula based on SCr of 0.87 mg/dL). Liver Function Tests: Recent Labs  Lab 08/01/20 1542  AST 21  ALT 13  ALKPHOS 67  BILITOT 1.1  PROT 7.1  ALBUMIN 4.3   No results for input(s): LIPASE, AMYLASE in the last 168 hours. No results for input(s): AMMONIA in the last 168 hours. Coagulation Profile: Recent Labs  Lab 08/01/20 1542  INR 1.0   Cardiac Enzymes: No results for input(s): CKTOTAL, CKMB, CKMBINDEX, TROPONINI in the last 168 hours. BNP (last 3 results) No results for input(s): PROBNP in the last 8760 hours. HbA1C: No results for input(s): HGBA1C in the last 72 hours. CBG: No results for input(s): GLUCAP in the last 168 hours. Lipid Profile: No results for input(s): CHOL,  HDL, LDLCALC, TRIG, CHOLHDL, LDLDIRECT in the last 72 hours. Thyroid Function Tests: No results for input(s): TSH, T4TOTAL, FREET4, T3FREE, THYROIDAB in the last 72 hours. Anemia Panel: No results for input(s): VITAMINB12, FOLATE, FERRITIN, TIBC, IRON, RETICCTPCT in the last 72 hours. Urine analysis:    Component Value Date/Time   COLORURINE YELLOW (A) 08/02/2020 0145   APPEARANCEUR CLEAR (A) 08/02/2020 0145   LABSPEC 1.017 08/02/2020 0145   PHURINE 6.0 08/02/2020 0145   GLUCOSEU NEGATIVE 08/02/2020 0145   HGBUR NEGATIVE 08/02/2020 0145   HGBUR trace-lysed 12/12/2010 1509   BILIRUBINUR NEGATIVE 08/02/2020 0145   BILIRUBINUR Negative 08/01/2020 0922   KETONESUR 5 (A) 08/02/2020 0145   PROTEINUR NEGATIVE 08/02/2020 0145   UROBILINOGEN 0.2 08/01/2020 0922   UROBILINOGEN 0.2 12/12/2010 1509   NITRITE NEGATIVE 08/02/2020 0145   LEUKOCYTESUR NEGATIVE 08/02/2020 0145    Radiological Exams on Admission: CT HEAD WO CONTRAST  Result Date: 08/01/2020 CLINICAL DATA:  Neuro deficit, acute, stroke suspected 86 year old dementia patient with legs giving out while standing and confusion. EXAM: CT HEAD WITHOUT CONTRAST TECHNIQUE: Contiguous axial images were obtained from the base of the skull through the vertex without intravenous contrast. COMPARISON:  Head CT 06/21/2019 FINDINGS: Brain: Stable degree of atrophy and chronic small vessel ischemia. No intracranial hemorrhage, mass effect, or midline shift. No hydrocephalus. The basilar cisterns are patent. No evidence of territorial infarct or acute ischemia. No extra-axial or intracranial fluid collection. Vascular: Atherosclerosis of skullbase vasculature without hyperdense vessel or abnormal calcification. Skull: No fracture or focal lesion. Sinuses/Orbits: Paranasal sinuses and mastoid air cells are clear. The visualized orbits are unremarkable. Other: None. IMPRESSION: 1. No acute intracranial abnormality. 2. Stable atrophy and chronic small vessel  ischemia. Electronically Signed   By: Keith Rake M.D.   On: 08/01/2020 15:38   DG Chest Port 1 View  Result Date: 08/02/2020 CLINICAL DATA:  Legs giving out.  Weakness  and confusion EXAM: PORTABLE CHEST 1 VIEW COMPARISON:  05/23/2020 FINDINGS: Normal heart size and pulmonary vascularity. Bronchiectasis, peribronchial thickening, and central interstitial fibrosis consistent with chronic bronchitis. No airspace disease or consolidation. No pleural effusions. No pneumothorax. Mediastinal contours appear intact. Ectatic aorta. Degenerative changes in the spine and shoulders. IMPRESSION: Chronic bronchitic changes in the lungs. No evidence of active pulmonary disease. Electronically Signed   By: Lucienne Capers M.D.   On: 08/02/2020 02:20     Assessment/Plan 86 year old female with history of dementia, recurrent UTIs, HTN, HLD presenting with 2-day history of confusion with hallucinations, typical for her when she has UTIs.    Acute metabolic encephalopathy in the setting of dementia Possible sepsis versus lactic acidosis   Recurrent UTI (urinary tract infection) -Patient presents with altered mental status typical of when she has UTIs -Patient not quite meeting sepsis criteria except for isolated lactic acid elevation.  Chest x-ray and urinalysis unremarkable -Was started on broad-spectrum antibiotics for sepsis of unknown source in the ER -We will get CT abdomen and pelvis -If no evidence of abnormal finding, and no further evidence of sepsis, antibiotics to be DC'd    Generalized weakness -Patient reported to be very weak unable to walk which is not her baseline -Possibly related to acute infection -Fall and aspiration precautions -PT OT consult    Dementia without behavioral disturbance (HCC) -No acute behavioral disturbance     DVT prophylaxis: Lovenox  Code Status: DNR Family Communication: Daughter Disposition Plan: Back to previous home environment Consults called: none   Status:At the time of admission, it appears that the appropriate admission status for this patient is INPATIENT. This is judged to be reasonable and necessary in order to provide the required intensity of service to ensure the patient's safety given the presenting symptoms, physical exam findings, and initial radiographic and laboratory data in the context of their  Comorbid conditions.   Patient requires inpatient status due to high intensity of service, high risk for further deterioration and high frequency of surveillance required.   I certify that at the point of admission it is my clinical judgment that the patient will require inpatient hospital care spanning beyond [place] MD [facility]     08/02/2020, 4:30 AM

## 2020-08-02 NOTE — ED Notes (Signed)
Pt laying in bed with sitter. Pt unaware of day, time or where and why she is at the hospital.

## 2020-08-02 NOTE — Progress Notes (Signed)
Anticoagulation monitoring(Lovenox):  86yo  female ordered Lovenox 40 mg Q24h for DVT prophylaxis  Filed Weights   08/01/20 1457  Weight: 49.4 kg (109 lb)   BMI 21.29   Lab Results  Component Value Date   CREATININE 0.87 08/01/2020   CREATININE 0.99 05/23/2020   CREATININE 0.73 07/05/2019   Estimated Creatinine Clearance: 27.8 mL/min (by C-G formula based on SCr of 0.87 mg/dL). Hemoglobin & Hematocrit     Component Value Date/Time   HGB 15.0 08/01/2020 1542   HCT 45.0 08/01/2020 1542     Per Protocol for Patient with estCrcl < 30 ml/min and BMI < 40, will transition to Lovenox 30 mg Q24h.     Paulina Fusi, PharmD, BCPS 08/02/2020 4:36 AM

## 2020-08-02 NOTE — Telephone Encounter (Signed)
IV abx was given in ER, will not send in this one

## 2020-08-02 NOTE — TOC Initial Note (Addendum)
Transition of Care [facility]) - Initial/Assessment Note    Patient Details  Name: Michelle Gregory MRN: [id number] Date of Birth: October 03, 1923  [facility]) CM/SW Contact:    Ova [place] Phone Number: [phone] 08/02/2020, 2:34 PM  Clinical Narrative:                  Patient presents to [facility] ED due to bilateral weakness of the legs.  Patient lives with her [place] (Daughter) [phone], who is the patient's primary care giver. TOC spoke with Ms. Coble for collateral information due to the patient's fluctuating orientation. CSW explained TOC role in patient care and timeline for SNF placement or home health. [name] stated she the patient walk with a walker, but needs limited assistance with ADLs.  Patient is incontinent and needs assistance with showering, and has a care giver who assists with bathing twice a week.  Patient's daughter transports patient to appointments, pharmacy and picks up groceries.  [name] stated if the recommendation os SNF placement she would like for the patient to go to [facility].  FL2 created and signed by Attending.  Rosalie Gums #[phone] [id number]  Expected Discharge Plan: [place] Barriers to Discharge: SNF Pending bed offer   Patient Goals and CMS Choice Patient states their goals for this hospitalization and ongoing recovery are:: [facility] is the preferred SNF placement. CMS Medicare.gov Compare Post Acute Care list provided to:: Patient Represenative (must comment) (Coble,[name] (Daughter) [phone]) Choice offered to / list presented to : Adult Children  Expected Discharge Plan and Services Expected Discharge Plan: [place] In-house Referral: Clinical Social Work                                            Prior Living Arrangements/Services   Lives with:: Adult Children Patient language and need for interpreter reviewed:: Yes Do you feel safe going back to the place where you live?:  Yes      Need for Family Participation in Patient Care: Yes (Comment) Care giver support system in place?: Yes (comment)   Criminal Activity/Legal Involvement Pertinent to Current Situation/Hospitalization: No - Comment as needed  Activities of Daily Living      Permission Sought/Granted Permission sought to share information with : Facility Sport and exercise psychologist    Share Information with NAME: Coble,[name] (Daughter) [phone]  Permission granted to share info w AGENCY: [facility]        Emotional Assessment Appearance:: Appears stated age Attitude/Demeanor/Rapport: Unable to Assess Affect (typically observed): Unable to Assess Orientation: : Fluctuating Orientation (Suspected and/or reported Sundowners) Alcohol / Substance Use: Not Applicable Psych Involvement: No (comment)  Admission diagnosis:  Sepsis [facility]) [A41.9] Patient Active Problem List   Diagnosis Date Noted  . Sepsis ([place]) 08/02/2020  . Lactic acidosis 08/02/2020  . Acute metabolic encephalopathy 54/49/2010  . Screening-pulmonary TB 07/24/2020  . Syncope 05/23/2020  . Sundowning 05/23/2020  . Dementia with behavioral disturbance ([place])   . Acute blood loss as cause of postoperative anemia 06/27/2019  . History of pelvic fracture 06/27/2019  . Closed displaced intertrochanteric fracture of left femur ([place]) 06/21/2019  . Anxiety with depression 06/21/2019  . Diarrhea 03/24/2019  . Shingles 01/23/2019  . Fracture, ulna, distal 12/06/2018  . Thrombocytopenia ([place]) 12/06/2018  . Dementia without behavioral disturbance ([place]) 11/22/2018  . Hand injury, left, initial encounter 09/01/2018  . H/O  falling 05/03/2018  . Episodic confusion 05/03/2018  . Fall at home 03/29/2018  . Anxiety disorder 03/29/2018  . Generalized weakness 03/29/2018  . Chronic pain 02/09/2018  . Cerumen impaction 12/06/2017  . H/O Clostridium difficile infection 10/13/2017  . Recurrent UTI (urinary tract infection) 09/06/2017  .  Auditory hallucinations 03/03/2017  . Female cystocele 04/13/2016  . Bowel habit changes 12/30/2015  . Compression fracture 12/30/2015  . Routine general medical examination at a health care facility 04/09/2015  . Candidal intertrigo 03/28/2014  . Bilateral hearing loss due to cerumen impaction 01/17/2014  . Encounter for Medicare annual wellness exam 03/21/2013  . Gout 09/19/2012  . Age related osteoporosis 05/05/2011  . Degenerative lumbar disc 01/20/2011  . VARICOSE VEINS, LOWER EXTREMITIES 09/04/2010  . Prediabetes 01/08/2010  . Hyperlipidemia 05/09/2008  . Essential hypertension 05/09/2008  . HEMORRHOIDS, INTERNAL 05/09/2008  . Allergic rhinitis 05/09/2008  . Mild reactive airways disease 05/09/2008  . GERD 05/09/2008  . IBS 05/09/2008  . Osteoarthritis 05/09/2008  . History of malignant neoplasm of large intestine 05/09/2008   PCP:  Abner Greenspan, MD Pharmacy:   [facility], [address] [place] [address] Phone: [phone] Fax: [phone]  [place], [address], [address] [place], [place] [address] Phone: [phone] Fax: [phone]     Social Determinants of Health (SDOH) Interventions    Readmission Risk Interventions No flowsheet data found.

## 2020-08-02 NOTE — Evaluation (Signed)
Physical Therapy Evaluation Patient Details Name: DESHANTI ADCOX MRN: [id number] DOB: 1923-04-18 Today's Date: 08/02/2020   History of Present Illness  86 y.o. female with medical history significant for dementia, recurrent UTIs, HTN, HLD who presented to the emergency room with a 2 to 3-day history of altered mental status described as confusion and hallucinations, typical for her when she has UTIs.  She has had no fever, chills, cough or shortness of breath, no nausea or vomiting, abdominal pain or change in bowel habits.  History is taken from daughter over the phone due to patient's dementia.  Over the past 24 hours patient developed protracted weakness, with difficulty getting out of bed and walking which is her usual.Pt being treated for sepsis at this time.  Clinical Impression  Patient received in bed, lethargic, confused, but agreeable to try to sit up. She reports she lives with her mother. She required mod +2 assist with all mobility this session. Gait was not attempted due to fatigue and lethargy. She will continue to benefit from skilled PT while here to improve functional mobility and independence.       Follow Up Recommendations SNF    Equipment Recommendations  None recommended by PT;Other (comment) (TBD)    Recommendations for Other Services       Precautions / Restrictions Precautions Precautions: Fall Restrictions Weight Bearing Restrictions: No      Mobility  Bed Mobility Overal bed mobility: Needs Assistance Bed Mobility: Supine to Sit;Sit to Supine     Supine to sit: Mod assist;+2 for physical assistance Sit to supine: Mod assist;+2 for physical assistance   General bed mobility comments: cues and assist for safety.  Transfers Overall transfer level: Needs assistance Equipment used: 2 person hand held assist Transfers: Sit to/from Stand Sit to Stand: Mod assist;+2 physical assistance         General transfer comment: Patient with difficulty getting  back onto stretcher due to height  Ambulation/Gait             General Gait Details: unable this session  Stairs            Wheelchair Mobility    Modified Rankin (Stroke Patients Only)       Balance Overall balance assessment: Needs assistance Sitting-balance support: Feet supported;Bilateral upper extremity supported Sitting balance-Leahy Scale: Poor Sitting balance - Comments: min - mod A for static sitting balance   Standing balance support: Bilateral upper extremity supported;During functional activity Standing balance-Leahy Scale: Poor Standing balance comment: +2 assist for safety                             Pertinent Vitals/Pain Pain Assessment: Faces Faces Pain Scale: Hurts a little bit Pain Location: generalized with mobility Pain Descriptors / Indicators: Discomfort Pain Intervention(s): Monitored during session;Limited activity within patient's tolerance;Repositioned    Home Living Family/patient expects to be discharged to:: Private residence Living Arrangements: Children Available Help at Discharge: Family Type of Home: House Home Access: Stairs to enter Entrance Stairs-Rails: Right;Left;Can reach both Entrance Stairs-Number of Steps: 5 Home Layout: One level Home Equipment: Cane - single point;Walker - 2 wheels;Walker - 4 wheels;Shower seat;Grab bars - tub/shower;Grab bars - toilet Additional Comments: Information obtained via chart review. Pt's daughter and son in law live next door. Apparently, pt was transitioning to ALF prior to admission.    Prior Function Level of Independence: Independent with assistive device(s)  Comments: per chart review, pt stays with daughter overnight at daughter's home and brought back to her hourse during the day. Pt is poor historian and reports she lives with her momma (pt is 86 y/o)     Hand Dominance   Dominant Hand: Right    Extremity/Trunk Assessment   Upper Extremity  Assessment Upper Extremity Assessment: Generalized weakness;Defer to OT evaluation    Lower Extremity Assessment Lower Extremity Assessment: Generalized weakness    Cervical / Trunk Assessment Cervical / Trunk Assessment: Kyphotic  Communication   Communication: HOH  Cognition Arousal/Alertness: Awake/alert Behavior During Therapy: WFL for tasks assessed/performed Overall Cognitive Status: History of cognitive impairments - at baseline                                 General Comments: per chart, pt with hx of dementia. Pt very confused throughout session and oriented to self only. She reports she lives at home with her mother.      General Comments      Exercises     Assessment/Plan    PT Assessment Patient needs continued PT services  PT Problem List Decreased strength;Decreased mobility;Decreased activity tolerance;Decreased balance;Decreased safety awareness       PT Treatment Interventions DME instruction;Therapeutic activities;Gait training;Therapeutic exercise;Patient/family education;Functional mobility training;Balance training    PT Goals (Current goals can be found in the Care Plan section)  Acute Rehab PT Goals Patient Stated Goal: none stated PT Goal Formulation: Patient unable to participate in goal setting Time For Goal Achievement: 08/16/20    Frequency Min 2X/week   Barriers to discharge Decreased caregiver support      Co-evaluation               AM-PAC PT "6 Clicks" Mobility  Outcome Measure Help needed turning from your back to your side while in a flat bed without using bedrails?: A Little Help needed moving from lying on your back to sitting on the side of a flat bed without using bedrails?: A Lot Help needed moving to and from a bed to a chair (including a wheelchair)?: A Lot Help needed standing up from a chair using your arms (e.g., wheelchair or bedside chair)?: A Lot Help needed to walk in hospital room?: Total Help  needed climbing 3-5 steps with a railing? : Total 6 Click Score: 11    End of Session   Activity Tolerance: Patient limited by lethargy;Patient limited by fatigue Patient left: in bed;with call bell/phone within reach;with bed alarm set Nurse Communication: Mobility status PT Visit Diagnosis: Unsteadiness on feet (R26.81);Muscle weakness (generalized) (M62.81);Other abnormalities of gait and mobility (R26.89);Difficulty in walking, not elsewhere classified (R26.2)    Time: 7622-6333 PT Time Calculation (min) (ACUTE ONLY): 15 min   Charges:   PT Evaluation $PT Eval Moderate Complexity: 1 Mod          Jasnoor Trussell, PT, GCS 08/02/20,12:51 PM

## 2020-08-02 NOTE — TOC Progression Note (Addendum)
Transition of Care [facility]) - Progression Note    Patient Details  Name: Michelle Gregory MRN: [id number] Date of Birth: [birth year]  Transition of Care [facility]) CM/SW [place], [place] Phone Number:  [phone] 08/02/2020, 5:36 PM  Clinical Narrative:    CSW spoke with patient's daughter/HCPOA [name], [phone].  [name] stated her preference is [facility] for SNF placement.  CSW explained SNF placement timeline and process. CSW would also include a few other SNFs for placement. [name] verbalized understanding.   FL2 and CSW Referral sen to [facility] and other SNFs.  Expected Discharge Plan: Skilled Nursing Facility Barriers to Discharge: SNF Pending bed offer  Expected Discharge Plan and Services Expected Discharge Plan: [place] In-house Referral: Clinical Social Work                                             Social Determinants of Health (SDOH) Interventions    Readmission Risk Interventions No flowsheet data found.

## 2020-08-03 ENCOUNTER — Inpatient Hospital Stay: Payer: MEDICARE

## 2020-08-03 DIAGNOSIS — A419 Sepsis, unspecified organism: Secondary | ICD-10-CM | POA: Diagnosis not present

## 2020-08-03 DIAGNOSIS — N390 Urinary tract infection, site not specified: Secondary | ICD-10-CM

## 2020-08-03 DIAGNOSIS — R531 Weakness: Secondary | ICD-10-CM

## 2020-08-03 DIAGNOSIS — G9341 Metabolic encephalopathy: Secondary | ICD-10-CM

## 2020-08-03 DIAGNOSIS — R4182 Altered mental status, unspecified: Secondary | ICD-10-CM | POA: Diagnosis not present

## 2020-08-03 DIAGNOSIS — E872 Acidosis: Secondary | ICD-10-CM

## 2020-08-03 DIAGNOSIS — G309 Alzheimer's disease, unspecified: Secondary | ICD-10-CM

## 2020-08-03 DIAGNOSIS — F0280 Dementia in other diseases classified elsewhere without behavioral disturbance: Secondary | ICD-10-CM

## 2020-08-03 LAB — CBC WITH DIFFERENTIAL/PLATELET
Abs Immature Granulocytes: 0.03 10*3/uL (ref 0.00–0.07)
Basophils Absolute: 0.1 10*3/uL (ref 0.0–0.1)
Basophils Relative: 1 %
Eosinophils Absolute: 0.1 10*3/uL (ref 0.0–0.5)
Eosinophils Relative: 2 %
HCT: 36.7 % (ref 36.0–46.0)
Hemoglobin: 12.8 g/dL (ref 12.0–15.0)
Immature Granulocytes: 1 %
Lymphocytes Relative: 14 %
Lymphs Abs: 1 10*3/uL (ref 0.7–4.0)
MCH: 32.7 pg (ref 26.0–34.0)
MCHC: 34.9 g/dL (ref 30.0–36.0)
MCV: 93.9 fL (ref 80.0–100.0)
Monocytes Absolute: 0.6 10*3/uL (ref 0.1–1.0)
Monocytes Relative: 9 %
Neutro Abs: 4.9 10*3/uL (ref 1.7–7.7)
Neutrophils Relative %: 73 %
Platelets: 90 10*3/uL — ABNORMAL LOW (ref 150–400)
RBC: 3.91 MIL/uL (ref 3.87–5.11)
RDW: 13.1 % (ref 11.5–15.5)
WBC: 6.6 10*3/uL (ref 4.0–10.5)
nRBC: 0 % (ref 0.0–0.2)

## 2020-08-03 LAB — BASIC METABOLIC PANEL
Anion gap: 10 (ref 5–15)
BUN: 15 mg/dL (ref 8–23)
CO2: 22 mmol/L (ref 22–32)
Calcium: 8.2 mg/dL — ABNORMAL LOW (ref 8.9–10.3)
Chloride: 107 mmol/L (ref 98–111)
Creatinine, Ser: 0.7 mg/dL (ref 0.44–1.00)
GFR calc Af Amer: >60 mL/min (ref >60)
GFR calc non Af Amer: >60 mL/min (ref >60)
Glucose, Bld: 84 mg/dL (ref 70–99)
Potassium: 4 mmol/L (ref 3.5–5.1)
Sodium: 139 mmol/L (ref 135–145)

## 2020-08-03 LAB — AMMONIA: Ammonia: 10 umol/L (ref 9–35)

## 2020-08-03 LAB — URINE CULTURE: Culture: NO GROWTH

## 2020-08-03 MED ORDER — HALOPERIDOL LACTATE 5 MG/ML IJ SOLN
1.0000 mg | Freq: Four times a day (QID) | INTRAMUSCULAR | Status: DC | PRN
  Administered 2020-08-03 – 2020-08-04 (×2): 1 mg via INTRAVENOUS
  Filled 2020-08-03 (×2): qty 1

## 2020-08-03 MED ORDER — HALOPERIDOL 0.5 MG PO TABS
0.2500 mg | ORAL_TABLET | Freq: Three times a day (TID) | ORAL | Status: DC
  Filled 2020-08-03 (×2): qty 0.5

## 2020-08-03 MED ORDER — LORAZEPAM 2 MG/ML IJ SOLN
1.0000 mg | Freq: Four times a day (QID) | INTRAMUSCULAR | Status: DC | PRN
  Administered 2020-08-03 (×2): 1 mg via INTRAVENOUS
  Filled 2020-08-03 (×2): qty 1

## 2020-08-03 MED ORDER — HALOPERIDOL 0.5 MG PO TABS: 0.2500 mg | ORAL_TABLET | Freq: Three times a day (TID) | ORAL | 0 refills | Status: DC

## 2020-08-03 MED ORDER — ASCORBIC ACID 500 MG PO TABS
250.0000 mg | ORAL_TABLET | Freq: Every day | ORAL | Status: DC
  Administered 2020-08-04: 250 mg via ORAL
  Filled 2020-08-03: qty 1

## 2020-08-03 MED ORDER — RISPERIDONE 1 MG/ML PO SOLN
0.5000 mg | Freq: Two times a day (BID) | ORAL | Status: DC
  Administered 2020-08-03 – 2020-08-06 (×6): 0.5 mg via ORAL
  Filled 2020-08-03 (×7): qty 0.5

## 2020-08-03 MED ORDER — MIRTAZAPINE 15 MG PO TABS
15.0000 mg | ORAL_TABLET | Freq: Every day | ORAL | Status: DC
  Administered 2020-08-03 – 2020-08-05 (×3): 15 mg via ORAL
  Filled 2020-08-03 (×3): qty 1

## 2020-08-03 MED ORDER — QUETIAPINE FUMARATE 25 MG PO TABS: 12.5000 mg | ORAL_TABLET | Freq: Three times a day (TID) | ORAL | Status: DC

## 2020-08-03 NOTE — TOC Progression Note (Addendum)
Transition of Care [facility]) - Progression Note    Patient Details  Name: Michelle Gregory MRN: [id number] Date of Birth: 01-Jan-1923  Transition of Care [facility]) CM/SW [place], LCSW Phone Number: 08/03/2020, 1:45 PM  Clinical Narrative:  [id number] confirmed patient scheduled discharge to [facility] and [place], [phone] when medically cleared.    Received call from [facility] [place]. According to Lyna Poser from [facility], admissions coordinator.  Patient does not need pre-authorization.  Bed will be held until Monday.      Plan: Patient will discharge to [facility] and [place], [phone] when medically ready/cleared.    Expected Discharge Plan: Skilled Nursing Facility Barriers to Discharge: SNF Pending bed offer  Expected Discharge Plan and Services Expected Discharge Plan: [place] In-house Referral: Clinical Social Work      Social Determinants of Health (SDOH) Interventions   Readmission Risk Interventions No flowsheet data found.

## 2020-08-03 NOTE — Consult Note (Signed)
[facility] Face-to-Face Psychiatry Consult   Reason for Consult:  Agitation  Referring Physician:  EDP Patient Identification: Michelle Gregory MRN:  [id number] Principal Diagnosis: Dementia with behavioral disturbance ([place]) Diagnosis:  Principal Problem:   Dementia with behavioral disturbance ([place]) Active Problems:   Recurrent UTI (urinary tract infection)   Generalized weakness   Dementia without behavioral disturbance (HCC)   Sepsis ([place])   Lactic acidosis   Acute metabolic encephalopathy   Total Time spent with patient: 45 minutes  Subjective:   CAHTERINE Gregory is a 86 y.o. female patient admitted with agitation.  HPI:  Patient seen and evaluated in person by this provider.  Confused on assessment, wanted to go make breakfast.  Alert and orient to person only.  Believes this is [employer].  History of dementia and will return to [facility] on Monday.  Consult for agitation, medication adjustments made.  No suicidal/homicidal ideations, hallucinations, or substance sue.    Past Psychiatric History: dementia  Risk to Self:  none Risk to Others:  none Prior Inpatient Therapy:  none Prior Outpatient Therapy:  PCP  Past Medical History:  Past Medical History:  Diagnosis Date  . Allergic rhinitis   . Anxiety   . C. difficile diarrhea 10/2017  . Cancer ([place])   . Depression   . GERD (gastroesophageal reflux disease)   . History of colon cancer   . History of recurrent UTIs   . Hyperlipidemia   . Hypertension   . Osteoarthritis   . Spinal compression fracture ([place])   . Vertigo     Past Surgical History:  Procedure Laterality Date  . APPENDECTOMY    . CATARACT EXTRACTION    . COLON RESECTION    . INTRAMEDULLARY (IM) NAIL INTERTROCHANTERIC Left 06/22/2019   Procedure: INTRAMEDULLARY (IM) NAIL INTERTROCHANTRIC;  Surgeon: Rod Can, MD;  Location: [facility];  Service: Orthopedics;  Laterality: Left;  . TONSILLECTOMY     Family History:  Family History  Problem Relation Age  of Onset  . Prostate cancer Father   . Heart attack Mother   . Gout Mother    Family Psychiatric  History: none Social History:  Social History   Substance and Sexual Activity  Alcohol Use No  . Alcohol/week: 0.0 standard drinks     Social History   Substance and Sexual Activity  Drug Use No    Social History   Socioeconomic History  . Marital status: Widowed    Spouse name: Not on file  . Number of children: 1  . Years of education: Not on file  . Highest education level: Not on file  Occupational History  . Occupation: retired  Tobacco Use  . Smoking status: Never Smoker  . Smokeless tobacco: Never Used  Substance and Sexual Activity  . Alcohol use: No    Alcohol/week: 0.0 standard drinks  . Drug use: No  . Sexual activity: Never  Other Topics Concern  . Not on file  Social History Narrative  . Not on file   Social Determinants of Health   Financial Resource Strain:   . Difficulty of Paying Living Expenses: Not on file  Food Insecurity:   . Worried About Charity fundraiser in the Last Year: Not on file  . Ran Out of Food in the Last Year: Not on file  Transportation Needs:   . Lack of Transportation (Medical): Not on file  . Lack of Transportation (Non-Medical): Not on file  Physical Activity:   . Days of  Exercise per Week: Not on file  . Minutes of Exercise per Session: Not on file  Stress:   . Feeling of Stress : Not on file  Social Connections:   . Frequency of Communication with Friends and Family: Not on file  . Frequency of Social Gatherings with Friends and Family: Not on file  . Attends Religious Services: Not on file  . Active Member of Clubs or Organizations: Not on file  . Attends Archivist Meetings: Not on file  . Marital Status: Not on file   Additional Social History:    Allergies:   Allergies  Allergen Reactions  . Amoxicillin-Pot Clavulanate Diarrhea    Has patient had a PCN reaction causing immediate rash,  facial/tongue/throat swelling, SOB or lightheadedness with hypotension: Yes Has patient had a PCN reaction causing severe rash involving mucus membranes or skin necrosis: No Has patient had a PCN reaction that required hospitalization: No Has patient had a PCN reaction occurring within the last 10 years: No If all of the above answers are "NO", then may proceed with Cephalosporin use.   Marland Kitchen Keflex [Cephalexin] Diarrhea  . Septra [Sulfamethoxazole-Trimethoprim] Nausea Only    Labs:  Results for orders placed or performed during the hospital encounter of 08/02/20 (from the past 48 hour(s))  Respiratory Panel by RT PCR (Flu A&B, Covid) - Nasopharyngeal Swab     Status: None   Collection Time: 08/02/20 12:07 AM   Specimen: Nasopharyngeal Swab  Result Value Ref Range   SARS Coronavirus 2 by RT PCR NEGATIVE NEGATIVE    Comment: (NOTE) SARS-CoV-2 target nucleic acids are NOT DETECTED.  The SARS-CoV-2 RNA is generally detectable in upper respiratoy specimens during the acute phase of infection. The lowest concentration of SARS-CoV-2 viral copies this assay can detect is 131 copies/mL. A negative result does not preclude SARS-Cov-2 infection and should not be used as the sole basis for treatment or other patient management decisions. A negative result may occur with  improper specimen collection/handling, submission of specimen other than nasopharyngeal swab, presence of viral mutation(s) within the areas targeted by this assay, and inadequate number of viral copies (<131 copies/mL). A negative result must be combined with clinical observations, patient history, and epidemiological information. The expected result is Negative.  Fact Sheet for Patients:  PinkCheek.be  Fact Sheet for Healthcare Providers:  GravelBags.it  This test is no t yet approved or cleared by the [place] FDA and  has been authorized for detection and/or  diagnosis of SARS-CoV-2 by FDA under an Emergency Use Authorization (EUA). This EUA will remain  in effect (meaning this test can be used) for the duration of the COVID-19 declaration under Section 564(b)(1) of the Act, 21 U.S.C. section 360bbb-3(b)(1), unless the authorization is terminated or revoked sooner.     Influenza A by PCR NEGATIVE NEGATIVE   Influenza B by PCR NEGATIVE NEGATIVE    Comment: (NOTE) The Xpert Xpress SARS-CoV-2/FLU/RSV assay is intended as an aid in  the diagnosis of influenza from Nasopharyngeal swab specimens and  should not be used as a sole basis for treatment. Nasal washings and  aspirates are unacceptable for Xpert Xpress SARS-CoV-2/FLU/RSV  testing.  Fact Sheet for Patients: PinkCheek.be  Fact Sheet for Healthcare Providers: GravelBags.it  This test is not yet approved or cleared by the [place] FDA and  has been authorized for detection and/or diagnosis of SARS-CoV-2 by  FDA under an Emergency Use Authorization (EUA). This EUA will remain  in effect (meaning this  test can be used) for the duration of the  Covid-19 declaration under Section 564(b)(1) of the Act, 21  U.S.C. section 360bbb-3(b)(1), unless the authorization is  terminated or revoked. Performed at [facility], [address] [place], [place] [address]   Urinalysis, Complete w Microscopic     Status: Abnormal   Collection Time: 08/02/20  1:45 AM  Result Value Ref Range   Color, Urine YELLOW (A) YELLOW   APPearance CLEAR (A) CLEAR   Specific Gravity, Urine 1.017 1.005 - 1.030   pH 6.0 5.0 - 8.0   Glucose, UA NEGATIVE NEGATIVE mg/dL   Hgb urine dipstick NEGATIVE NEGATIVE   Bilirubin Urine NEGATIVE NEGATIVE   Ketones, ur 5 (A) NEGATIVE mg/dL   Protein, ur NEGATIVE NEGATIVE mg/dL   Nitrite NEGATIVE NEGATIVE   Leukocytes,Ua NEGATIVE NEGATIVE   RBC / HPF 0-5 0 - 5 RBC/hpf   WBC, UA 0-5 0 - 5 WBC/hpf    Bacteria, UA NONE SEEN NONE SEEN   Squamous Epithelial / LPF NONE SEEN 0 - 5   Mucus PRESENT     Comment: Performed at [facility], [address] [place], [place] [address]  Culture, blood (routine x 2)     Status: None (Preliminary result)   Collection Time: 08/02/20  1:45 AM   Specimen: BLOOD  Result Value Ref Range   Specimen Description BLOOD BLOOD RIGHT FOREARM    Special Requests      BOTTLES DRAWN AEROBIC AND ANAEROBIC Blood Culture adequate volume   Culture      NO GROWTH 1 DAY Performed at [facility], [address] [place], [place] [address]    Report Status PENDING   Culture, blood (routine x 2)     Status: None (Preliminary result)   Collection Time: 08/02/20  1:45 AM   Specimen: BLOOD  Result Value Ref Range   Specimen Description BLOOD BLOOD RIGHT FOREARM    Special Requests      BOTTLES DRAWN AEROBIC AND ANAEROBIC Blood Culture adequate volume   Culture      NO GROWTH 1 DAY Performed at [facility], [address] [place], [place] [address]    Report Status PENDING   Lactic acid, plasma     Status: Abnormal   Collection Time: 08/02/20  1:45 AM  Result Value Ref Range   Lactic Acid, Venous 2.4 (HH) 0.5 - 1.9 mmol/L    Comment: CRITICAL RESULT CALLED TO, READ BACK BY AND VERIFIED WITH RENEE BEAUREGARD ON 08/02/20 AT 0256 BY JAG Performed at [facility], [address] [place], [place] [address]   Lactic acid, plasma     Status: Abnormal   Collection Time: 08/02/20  1:45 AM  Result Value Ref Range   Lactic Acid, Venous 2.3 (HH) 0.5 - 1.9 mmol/L    Comment: CRITICAL VALUE NOTED. VALUE IS CONSISTENT WITH PREVIOUSLY REPORTED/CALLED VALUE / JAG Performed at [facility], [address] [place], [place] [address]   Urine Culture     Status: None   Collection Time: 08/02/20  4:15 AM   Specimen: Urine, Catheterized  Result Value Ref Range   Specimen Description      URINE, CATHETERIZED Performed at  [facility], [address], [place],  [address]    Special Requests      NONE Performed at [facility], [address], [place],  [address]    Culture      NO GROWTH Performed at [facility], [place] [address], [place], [place] [address]    Report Status 08/03/2020 FINAL   Protime-INR     Status: None   Collection Time: 08/02/20  5:02 AM  Result Value Ref Range   Prothrombin Time 13.0 11.4 - 15.2 seconds   INR 1.0 0.8 - 1.2    Comment: (NOTE) INR goal varies based on device and disease states. Performed at [facility], [address] [place], [place] [address]   Cortisol-am, blood     Status: None   Collection Time: 08/02/20  5:02 AM  Result Value Ref Range   Cortisol - AM 16.5 6.7 - 22.6 ug/dL    Comment: Performed at [facility] [address] [place], [place] [address]  Procalcitonin     Status: None   Collection Time: 08/02/20  5:02 AM  Result Value Ref Range   Procalcitonin <0.10 ng/mL    Comment:        Interpretation: PCT (Procalcitonin) <= 0.5 ng/mL: Systemic infection (sepsis) is not likely. Local bacterial infection is possible. (NOTE)       Sepsis PCT Algorithm           Lower Respiratory Tract                                      Infection PCT Algorithm    ----------------------------     ----------------------------         PCT < 0.25 ng/mL                PCT < 0.10 ng/mL          Strongly encourage             Strongly discourage   discontinuation of antibiotics    initiation of antibiotics    ----------------------------     -----------------------------       PCT 0.25 - 0.50 ng/mL            PCT 0.10 - 0.25 ng/mL               OR       >80% decrease in PCT            Discourage initiation of                                            antibiotics      Encourage discontinuation           of antibiotics    ----------------------------     -----------------------------         PCT >= 0.50  ng/mL              PCT 0.26 - 0.50 ng/mL               AND        <80% decrease in PCT             Encourage initiation of                                             antibiotics       Encourage continuation  of antibiotics    ----------------------------     -----------------------------        PCT >= 0.50 ng/mL                  PCT > 0.50 ng/mL               AND         increase in PCT                  Strongly encourage                                      initiation of antibiotics    Strongly encourage escalation           of antibiotics                                     -----------------------------                                           PCT <= 0.25 ng/mL                                                 OR                                        > 80% decrease in PCT                                      Discontinue / Do not initiate                                             antibiotics  Performed at [facility], [address] [place], [place] [address]   CBC     Status: Abnormal   Collection Time: 08/02/20  5:02 AM  Result Value Ref Range   WBC 9.3 4.0 - 10.5 K/uL   RBC 3.94 3.87 - 5.11 MIL/uL   Hemoglobin 13.4 12.0 - 15.0 g/dL   HCT 37.8 36 - 46 %   MCV 95.9 80.0 - 100.0 fL   MCH 34.0 26.0 - 34.0 pg   MCHC 35.4 30.0 - 36.0 g/dL   RDW 12.7 11.5 - 15.5 %   Platelets 127 (L) 150 - 400 K/uL   nRBC 0.0 0.0 - 0.2 %    Comment: Performed at [facility], [address] [place], [place] [address]  Creatinine, serum     Status: None   Collection Time: 08/02/20  5:02 AM  Result Value Ref Range   Creatinine, Ser 0.79 0.44 - 1.00 mg/dL   GFR calc non Af Amer >60 >60 mL/min   GFR calc Af Amer >60 >60 mL/min    Comment: Performed at [facility],  [place], [place] [address]  Lactic acid, plasma     Status: None   Collection Time: 08/02/20  8:11 AM  Result Value Ref Range   Lactic Acid, Venous 1.6 0.5 - 1.9 mmol/L     Comment: Performed at [facility], [address] [place], [place] [address]  Lactic acid, plasma     Status: None   Collection Time: 08/02/20 11:04 AM  Result Value Ref Range   Lactic Acid, Venous 1.3 0.5 - 1.9 mmol/L    Comment: Performed at [facility], [address], [place], [place] [address]  Basic metabolic panel     Status: Abnormal   Collection Time: 08/03/20  4:30 AM  Result Value Ref Range   Sodium 139 135 - 145 mmol/L   Potassium 4.0 3.5 - 5.1 mmol/L    Comment: HEMOLYSIS AT THIS LEVEL MAY AFFECT RESULT   Chloride 107 98 - 111 mmol/L   CO2 22 22 - 32 mmol/L   Glucose, Bld 84 70 - 99 mg/dL    Comment: Glucose reference range applies only to samples taken after fasting for at least 8 hours.   BUN 15 8 - 23 mg/dL   Creatinine, Ser 0.70 0.44 - 1.00 mg/dL   Calcium 8.2 (L) 8.9 - 10.3 mg/dL   GFR calc non Af Amer >60 >60 mL/min   GFR calc Af Amer >60 >60 mL/min   Anion gap 10 5 - 15    Comment: Performed at [facility], [address] [place], [place] [address]    Current Facility-Administered Medications  Medication Dose Route Frequency Provider Last Rate Last Admin  . 0.9 %  sodium chloride infusion   Intravenous Continuous Athena Masse, MD 75 mL/hr at 08/02/20 1606 New Bag at 08/02/20 1606  . acetaminophen (TYLENOL) tablet 650 mg  650 mg Oral Q6H PRN Athena Masse, MD       Or  . acetaminophen (TYLENOL) suppository 650 mg  650 mg Rectal Q6H PRN Athena Masse, MD      . acidophilus (RISAQUAD) capsule 1 capsule  1 capsule Oral Daily Shahmehdi, Seyed A, MD   1 capsule at 08/03/20 0927  . allopurinol (ZYLOPRIM) tablet 100 mg  100 mg Oral Daily Shahmehdi, Seyed A, MD   100 mg at 08/03/20 0927  . [START ON 08/04/2020] ascorbic acid (VITAMIN C) tablet 250 mg  250 mg Oral Daily Shahmehdi, Seyed A, MD      . aspirin EC tablet 81 mg  81 mg Oral Daily Shahmehdi, Seyed A, MD      . calcium-vitamin D (OSCAL WITH D) 500-200 MG-UNIT per  tablet 1 tablet  1 tablet Oral Daily Shahmehdi, Seyed A, MD      . ceFEPIme (MAXIPIME) 2 g in sodium chloride 0.9 % 100 mL IVPB  2 g Intravenous Q24H Vira Blanco, [facility]   Stopped at 08/03/20 0931  . enoxaparin (LOVENOX) injection 40 mg  40 mg Subcutaneous Q24H Athena Masse, MD   40 mg at 08/02/20 2102  . gabapentin (NEURONTIN) capsule 300 mg  300 mg Oral BID Skipper Cliche A, MD   300 mg at 08/03/20 0928  . LORazepam (ATIVAN) injection 1 mg  1 mg Intravenous Q6H PRN Shahmehdi, Seyed A, MD   1 mg at 08/03/20 1318  . memantine (NAMENDA) tablet 5 mg  5 mg Oral BID Skipper Cliche A, MD   5 mg at 08/03/20 0928  . metoprolol tartrate (LOPRESSOR)  tablet 50 mg  50 mg Oral BID Shahmehdi, Seyed A, MD   50 mg at 08/02/20 1731  . mirtazapine (REMERON) tablet 15 mg  15 mg Oral QHS Shahmehdi, Seyed A, MD      . multivitamin with minerals tablet 1 tablet  1 tablet Oral Daily Shahmehdi, Seyed A, MD      . ondansetron (ZOFRAN) tablet 4 mg  4 mg Oral Q6H PRN Athena Masse, MD       Or  . ondansetron [facility]) injection 4 mg  4 mg Intravenous Q6H PRN Athena Masse, MD      . risperiDONE (RISPERDAL) 1 MG/ML oral solution 0.5 mg  0.5 mg Oral BID Patrecia Pour, NP       Current Outpatient Medications  Medication Sig Dispense Refill  . allopurinol (ZYLOPRIM) 100 MG tablet TAKE 2 TABLETS BY MOUTH  DAILY (Patient taking differently: Take 100 mg by mouth daily. ) 180 tablet 1  . Ascorbic Acid (VITAMIN C) 100 MG tablet Take 100 mg by mouth daily.    Marland Kitchen aspirin 81 MG tablet Take 81 mg by mouth daily.      . calcium-vitamin D (CALCIUM 500 +D) 500 MG tablet Take 1 tablet by mouth daily.     Marland Kitchen CRANBERRY PO Take 1 capsule by mouth 2 (two) times daily.    . memantine (NAMENDA) 5 MG tablet Take 1 tablet (5 mg total) by mouth 2 (two) times daily. 1 tablet 0  . mirtazapine (REMERON) 15 MG tablet Take 1 tablet (15 mg total) by mouth at bedtime. 30 tablet 11  . multivitamin (THERAGRAN) per tablet Take 1 tablet by mouth  daily.      . Probiotic Product (PROBIOTIC DAILY PO) Take 1 tablet by mouth daily.    . haloperidol (HALDOL) 0.5 MG tablet Take 0.5 tablets (0.25 mg total) by mouth 3 (three) times daily for 10 days. 15 tablet 0    Musculoskeletal: Strength & Muscle Tone: increased Gait & Station: did not witness Patient leans: N/A  Psychiatric Specialty Exam: Physical Exam Vitals and nursing note reviewed.  Constitutional:      Appearance: Normal appearance.  HENT:     Head: Normocephalic.     Nose: Nose normal.  Pulmonary:     Effort: Pulmonary effort is normal.  Musculoskeletal:     Cervical back: Normal range of motion.  Neurological:     General: No focal deficit present.     Mental Status: She is alert.  Psychiatric:        Attention and Perception: She is inattentive.        Mood and Affect: Mood is anxious.        Speech: Speech normal.        Behavior: Behavior is agitated.        Thought Content: Thought content normal.        Cognition and Memory: Cognition is impaired. Memory is impaired.        Judgment: Judgment is impulsive.     Review of Systems  Psychiatric/Behavioral: Positive for confusion. The patient is nervous/anxious.   All other systems reviewed and are negative.   Blood pressure (!) 173/106, pulse 69, temperature 98.4 F (36.9 C), resp. rate (!) 21, height 5' (1.524 m), weight 49.4 kg, last menstrual period 11/02/1980, SpO2 95 %.Body mass index is 21.29 kg/m.  General Appearance: Casual  Eye Contact:  Fair  Speech:  Normal Rate  Volume:  Normal  Mood:  Anxious  Affect:  Congruent  Thought Process:  Irrelevant  Orientation:  Other:  person  Thought Content:  Rumination  Suicidal Thoughts:  No  Homicidal Thoughts:  No  Memory:  Immediate;   Poor Recent;   Poor Remote;   Poor  Judgement:  Impaired  Insight:  Lacking  Psychomotor Activity:  Increased  Concentration:  Concentration: Poor and Attention Span: Poor  Recall:  Poor  Fund of Knowledge:  Fair   Language:  Fair  Akathisia:  No  Handed:  Right  AIMS (if indicated):     Assets:  Housing Leisure Time Resilience Social Support  ADL's:  Impaired  Cognition:  Impaired,  Moderate  Sleep:        Treatment Plan Summary: Dementia with behavioral disturbance: -Discontinued Seroquel and Haldol -Started Risperdal liquid 1 mg BID -Continue Ativan 1 mg ever six hours PRN -Continue Namenda 5 mg BID  Insomnia: -Continue Remeron 15 mg daily at bedtime  Disposition: No evidence of imminent risk to self or others at present.   Supportive therapy provided about ongoing stressors.  Waylan Boga, NP 08/03/2020 4:11 PM

## 2020-08-03 NOTE — ED Notes (Signed)
Per unit room is being cleaned/sitter and another pt being moved. Extension given for call-back.

## 2020-08-03 NOTE — ED Notes (Signed)
Per ED charge- pt to be transported. ED tech taking pt on stretcher. Heads up given to floor.

## 2020-08-03 NOTE — ED Notes (Signed)
NA stated she got pt up to the bathroom prior to me coming in to get vitals.

## 2020-08-03 NOTE — ED Notes (Signed)
Amber, rn.

## 2020-08-03 NOTE — Progress Notes (Signed)
PROGRESS NOTE    Patient: Michelle Gregory                            PCP: Abner Greenspan, MD                    DOB: 1922/12/29            DOA: 08/02/2020 [id number]             DOS: 08/03/2020, 12:19 PM   LOS: 1 day   Date of Service: The patient was seen and examined on 08/03/2020  Subjective:   The patient was seen and examined this morning, remained awake, alert confused, spitting out her medication as nursing staff was trying to give her meds with applesauce Patient has had some agitation overnight per nursing staff.  Brief Narrative:   [name]. [name] who is a 86 year old female with history of dementia, recurrent UTIs, HTN, HLD presenting with 2-day history of confusion with hallucinations, typical for her when she has UTIs.  She was admitted as she met the SIRS criteria, with lactic acidosis.  No obvious source of infection. Did not met the sepsis criteria. Acute metabolic encephalopathy in the setting of dementia.    Admitted for further work-up   Assessment & Plan:   Active Problems:   Recurrent UTI (urinary tract infection)   Generalized weakness   Dementia without behavioral disturbance (HCC)   Sepsis (HCC)   Lactic acidosis   Acute metabolic encephalopathy    Acute metabolic encephalopathy in the setting of dementia Possible sepsis versus lactic acidosis - Recurrent UTI (urinary tract infection) -Improved lactic acidosis -Remain confused, agitated at times -Hemodynamically stable -Lactic acidosis, likely due to dehydration poor p.o. intake, no signs of overt infection Sepsis ruled out, afebrile normotensive no leukocytosis mildly tachypneic -Lactic acid 2.4 >> 1.6, 1.3  -WBC 7.6, 9.3, -UA; clean never any bacteria, negative for any nitrites trace of leukocyte esterase WBC 0-5  -Patient presents with altered mental status typical of when she has UTIs -Patient not quite meeting sepsis criteria except for isolated lactic acid elevation.    -She has been  started on broad-spectrum antibiotics likely will be tapered down and discontinued We will discontinue vancomycin and Flagyl, continue cefepime for today  -Follow with cultures -CT abdomen and pelvis-  -CT of the head; never any acute findings -Chest x-ray negative for any acute changes chronic    Generalized weakness -Patient reported to be very weak unable to walk which is not her baseline -Possibly related to acute infection -Fall and aspiration precautions -Pending PT/OT evaluation-patient is not cooperative -Likely will need SNF versus ALS upon discharge    Dementia without behavioral disturbance (HCC) -With behavior disturbance, agitation -Remained very confused today, follows some commands   Hypertension -BP remained stable, continue home meds  Hyperlipidemia -Stable, continue statins   DVT prophylaxis: Lovenox  Code Status: DNR Family Communication: Daughter Disposition Plan:  Likely assisted living versus SNF Consults called: none  Status:At the time of admission, it appears that the appropriate admission status for this patient is INPATIENT.      Nutritional status:         Cultures; Blood Cultures x 2 >> NGT Urine Culture  >>> NGT    Antimicrobials: 08/02/2020  >> IV antibiotics cefepime/metronidazole/vancomycin >> discontinued vancomycin and metronidazole 08/02/2020 continue cefepime   Procedures:   No admission procedures for hospital encounter.  Antimicrobials:  Anti-infectives (From admission, onward)   Start     Dose/Rate Route Frequency Ordered Stop   08/03/20 0600  ceFEPIme (MAXIPIME) 2 g in sodium chloride 0.9 % 100 mL IVPB        2 g 200 mL/hr over 30 Minutes Intravenous Every 24 hours 08/02/20 0440     08/03/20 0400  vancomycin (VANCOREADY) IVPB 500 mg/100 mL  Status:  Discontinued        500 mg 100 mL/hr over 60 Minutes Intravenous Every 24 hours 08/02/20 0440 08/02/20 0803   08/02/20 1200  metroNIDAZOLE (FLAGYL) IVPB 500  mg  Status:  Discontinued        500 mg 100 mL/hr over 60 Minutes Intravenous Every 8 hours 08/02/20 0430 08/02/20 0804   08/02/20 0330  ceFEPIme (MAXIPIME) 2 g in sodium chloride 0.9 % 100 mL IVPB        2 g 200 mL/hr over 30 Minutes Intravenous  Once 08/02/20 0322 08/02/20 0541   08/02/20 0315  aztreonam (AZACTAM) 2 g in sodium chloride 0.9 % 100 mL IVPB  Status:  Discontinued        2 g 200 mL/hr over 30 Minutes Intravenous  Once 08/02/20 0314 08/02/20 0322   08/02/20 0315  metroNIDAZOLE (FLAGYL) IVPB 500 mg        500 mg 100 mL/hr over 60 Minutes Intravenous  Once 08/02/20 0314 08/02/20 0502   08/02/20 0315  vancomycin (VANCOCIN) IVPB 1000 mg/200 mL premix        1,000 mg 200 mL/hr over 60 Minutes Intravenous  Once 08/02/20 0314 08/02/20 0502       Medication:  . acidophilus  1 capsule Oral Daily  . allopurinol  100 mg Oral Daily  . Ascorbic Acid  100 mg Oral Daily  . aspirin EC  81 mg Oral Daily  . calcium-vitamin D  1 tablet Oral Daily  . enoxaparin (LOVENOX) injection  40 mg Subcutaneous Q24H  . gabapentin  300 mg Oral BID  . memantine  5 mg Oral BID  . metoprolol tartrate  50 mg Oral BID  . mirtazapine  15 mg Oral QHS  . multivitamin with minerals  1 tablet Oral Daily  . QUEtiapine  25 mg Oral TID    acetaminophen **OR** acetaminophen, LORazepam, ondansetron **OR** ondansetron (ZOFRAN) IV   Objective:   Vitals:   08/03/20 0608 08/03/20 0700 08/03/20 0900 08/03/20 1100  BP:  135/62 (!) 135/91 97/62  Pulse: 80 67 82 69  Resp: (!) _0 (!) 21  Temp:      TempSrc:      SpO2: 93% 97% 96% 91%  Weight:      Height:        Intake/Output Summary (Last 24 hours) at 08/03/2020 1219 Last data filed at 08/03/2020 0428 Gross per 24 hour  Intake --  Output 400 ml  Net -400 ml   Filed Weights   08/01/20 1457  Weight: 49.4 kg     Examination:      Physical Exam:   General:   Awake, alert, confused -generalized cachexia  HEENT:  Normocephalic, PERRL,  otherwise with in Normal limits   Neuro:  CNII-XII intact. , normal motor and sensation, reflexes intact   Lungs:   Clear to auscultation BL, Respirations unlabored, no wheezes / crackles  Cardio:    S1/S2, RRR, No murmure, No Rubs or Gallops   Abdomen:   Soft, non-tender, bowel sounds active all four quadrants,  no guarding or  peritoneal signs.  Muscular skeletal:   Generalized weaknesses, Limited exam - in bed, able to move all 4 extremities, Normal strength,  2+ pulses,  symmetric, No pitting edema  Skin:  Dry, warm to touch, negative for any Rashes, No open wounds  Wounds: Please see nursing documentation            ------------------------------------------------------------------------------------------------------------------------------------------    LABs:  CBC Latest Ref Rng & Units 08/02/2020 08/01/2020 05/23/2020  WBC 4.0 - 10.5 K/uL 9.3 7.6 8.1  Hemoglobin 12.0 - 15.0 g/dL 13.4 15.0 14.0  Hematocrit 36 - 46 % 37.8 45.0 43.1  Platelets 150 - 400 K/uL 127(L) 125(L) PLATELET CLUMPS NOTED ON SMEAR, UNABLE TO ESTIMATE   CMP Latest Ref Rng & Units 08/03/2020 08/02/2020 08/01/2020  Glucose 70 - 99 mg/dL 84 - 106(H)  BUN 8 - 23 mg/dL 15 - 21  Creatinine 0.44 - 1.00 mg/dL 0.70 0.79 0.87  Sodium 135 - 145 mmol/L 139 - 142  Potassium 3.5 - 5.1 mmol/L 4.0 - 4.1  Chloride 98 - 111 mmol/L 107 - 99  CO2 22 - 32 mmol/L 22 - 29  Calcium 8.9 - 10.3 mg/dL 8.2(L) - 9.6  Total Protein 6.5 - 8.1 g/dL - - 7.1  Total Bilirubin 0.3 - 1.2 mg/dL - - 1.1  Alkaline Phos 38 - 126 U/L - - 67  AST 15 - 41 U/L - - 21  ALT 0 - 44 U/L - - 13       Micro Results Recent Results (from the past 240 hour(s))  Urine Culture     Status: None   Collection Time: 08/01/20  9:30 AM   Specimen: Urine  Result Value Ref Range Status   MICRO NUMBER: [id number]  Final   SPECIMEN QUALITY: Adequate  Final   Sample Source URINE  Final   STATUS: FINAL  Final   Result: No Growth  Final  Respiratory Panel by RT  PCR (Flu A&B, Covid) - Nasopharyngeal Swab     Status: None   Collection Time: 08/02/20 12:07 AM   Specimen: Nasopharyngeal Swab  Result Value Ref Range Status   SARS Coronavirus 2 by RT PCR NEGATIVE NEGATIVE Final    Comment: (NOTE) SARS-CoV-2 target nucleic acids are NOT DETECTED.  The SARS-CoV-2 RNA is generally detectable in upper respiratoy specimens during the acute phase of infection. The lowest concentration of SARS-CoV-2 viral copies this assay can detect is 131 copies/mL. A negative result does not preclude SARS-Cov-2 infection and should not be used as the sole basis for treatment or other patient management decisions. A negative result may occur with  improper specimen collection/handling, submission of specimen other than nasopharyngeal swab, presence of viral mutation(s) within the areas targeted by this assay, and inadequate number of viral copies (<131 copies/mL). A negative result must be combined with clinical observations, patient history, and epidemiological information. The expected result is Negative.  Fact Sheet for Patients:  PinkCheek.be  Fact Sheet for Healthcare Providers:  GravelBags.it  This test is no t yet approved or cleared by the [place] FDA and  has been authorized for detection and/or diagnosis of SARS-CoV-2 by FDA under an Emergency Use Authorization (EUA). This EUA will remain  in effect (meaning this test can be used) for the duration of the COVID-19 declaration under Section 564(b)(1) of the Act, 21 U.S.C. section 360bbb-3(b)(1), unless the authorization is terminated or revoked sooner.     Influenza A by PCR NEGATIVE NEGATIVE Final   Influenza B by PCR  NEGATIVE NEGATIVE Final    Comment: (NOTE) The Xpert Xpress SARS-CoV-2/FLU/RSV assay is intended as an aid in  the diagnosis of influenza from Nasopharyngeal swab specimens and  should not be used as a sole basis for  treatment. Nasal washings and  aspirates are unacceptable for Xpert Xpress SARS-CoV-2/FLU/RSV  testing.  Fact Sheet for Patients: PinkCheek.be  Fact Sheet for Healthcare Providers: GravelBags.it  This test is not yet approved or cleared by the [place] FDA and  has been authorized for detection and/or diagnosis of SARS-CoV-2 by  FDA under an Emergency Use Authorization (EUA). This EUA will remain  in effect (meaning this test can be used) for the duration of the  Covid-19 declaration under Section 564(b)(1) of the Act, 21  U.S.C. section 360bbb-3(b)(1), unless the authorization is  terminated or revoked. Performed at [facility], [address] [place], [place] [address]   Culture, blood (routine x 2)     Status: None (Preliminary result)   Collection Time: 08/02/20  1:45 AM   Specimen: BLOOD  Result Value Ref Range Status   Specimen Description BLOOD BLOOD RIGHT FOREARM  Final   Special Requests   Final    BOTTLES DRAWN AEROBIC AND ANAEROBIC Blood Culture adequate volume   Culture   Final    NO GROWTH 1 DAY Performed at [facility], [address], [place], [place] [address]    Report Status PENDING  Incomplete  Culture, blood (routine x 2)     Status: None (Preliminary result)   Collection Time: 08/02/20  1:45 AM   Specimen: BLOOD  Result Value Ref Range Status   Specimen Description BLOOD BLOOD RIGHT FOREARM  Final   Special Requests   Final    BOTTLES DRAWN AEROBIC AND ANAEROBIC Blood Culture adequate volume   Culture   Final    NO GROWTH 1 DAY Performed at [facility], [address], [place], [place] [address]    Report Status PENDING  Incomplete  Urine Culture     Status: None   Collection Time: 08/02/20  4:15 AM   Specimen: Urine, Catheterized  Result Value Ref Range Status   Specimen Description   Final    URINE, CATHETERIZED Performed at [facility],  [address], [place], [place] [address]    Special Requests   Final    NONE Performed at [facility], [address], [place], [place] [address]    Culture   Final    NO GROWTH Performed at [facility], [place] [address] [place], [place] [address]    Report Status 08/03/2020 FINAL  Final    Radiology Reports CT HEAD WO CONTRAST  Result Date: 08/01/2020 CLINICAL DATA:  Neuro deficit, acute, stroke suspected 87 year old dementia patient with legs giving out while standing and confusion. EXAM: CT HEAD WITHOUT CONTRAST TECHNIQUE: Contiguous axial images were obtained from the base of the skull through the vertex without intravenous contrast. COMPARISON:  Head CT 06/21/2019 FINDINGS: Brain: Stable degree of atrophy and chronic small vessel ischemia. No intracranial hemorrhage, mass effect, or midline shift. No hydrocephalus. The basilar cisterns are patent. No evidence of territorial infarct or acute ischemia. No extra-axial or intracranial fluid collection. Vascular: Atherosclerosis of skullbase vasculature without hyperdense vessel or abnormal calcification. Skull: No fracture or focal lesion. Sinuses/Orbits: Paranasal sinuses and mastoid air cells are clear. The visualized orbits are unremarkable. Other: None. IMPRESSION: 1. No acute intracranial abnormality. 2. Stable atrophy and chronic small vessel ischemia.  Electronically Signed   By: Keith Rake M.D.   On: 08/01/2020 15:38   DG Chest Port 1 View  Result Date: 08/02/2020 CLINICAL DATA:  Legs giving out.  Weakness and confusion EXAM: PORTABLE CHEST 1 VIEW COMPARISON:  05/23/2020 FINDINGS: Normal heart size and pulmonary vascularity. Bronchiectasis, peribronchial thickening, and central interstitial fibrosis consistent with chronic bronchitis. No airspace disease or consolidation. No pleural effusions. No pneumothorax. Mediastinal contours appear intact. Ectatic aorta. Degenerative changes in the spine and shoulders.  IMPRESSION: Chronic bronchitic changes in the lungs. No evidence of active pulmonary disease. Electronically Signed   By: Lucienne Capers M.D.   On: 08/02/2020 02:20    SIGNED: Deatra James, MD, FACP, FHM. [facility],  Pager (please use [email or web address] to page/text)  If 7PM-7AM, please contact night-coverage [email or web address] 08/03/2020, 12:19 PM

## 2020-08-03 NOTE — ED Notes (Signed)
Pt pulled PIV out and attempting to remove cords and get OOB. Redirected as best as possible.

## 2020-08-03 NOTE — ED Notes (Signed)
Per floor, pt going to 217- state they will call when room is clean.

## 2020-08-03 NOTE — ED Notes (Signed)
Sitter present with pt.

## 2020-08-03 NOTE — ED Notes (Addendum)
Nightshift sitter leaving at this time. Pt will be without sitter and will be reassessed for further sitter need, per Charge. Pt is sleeping at this time.

## 2020-08-03 NOTE — ED Notes (Signed)
Pt awake, attempt to get vital signs, pt becomes very agitated, attempt to place pt on cardiac monitor, pt will not leave leads in place.

## 2020-08-03 NOTE — ED Notes (Signed)
Pt sleeping, sitter at bedside, informed sitter, linda that will need repeat vitals.

## 2020-08-03 NOTE — Plan of Care (Signed)
Continuing with plan of care. 

## 2020-08-04 DIAGNOSIS — G309 Alzheimer's disease, unspecified: Secondary | ICD-10-CM | POA: Diagnosis not present

## 2020-08-04 DIAGNOSIS — F0391 Unspecified dementia with behavioral disturbance: Principal | ICD-10-CM

## 2020-08-04 DIAGNOSIS — G9341 Metabolic encephalopathy: Secondary | ICD-10-CM | POA: Diagnosis not present

## 2020-08-04 DIAGNOSIS — R4182 Altered mental status, unspecified: Secondary | ICD-10-CM | POA: Diagnosis not present

## 2020-08-04 LAB — BASIC METABOLIC PANEL
Anion gap: 10 (ref 5–15)
BUN: 12 mg/dL (ref 8–23)
CO2: 30 mmol/L (ref 22–32)
Calcium: 8.3 mg/dL — ABNORMAL LOW (ref 8.9–10.3)
Chloride: 102 mmol/L (ref 98–111)
Creatinine, Ser: 0.59 mg/dL (ref 0.44–1.00)
GFR calc Af Amer: >60 mL/min (ref >60)
GFR calc non Af Amer: >60 mL/min (ref >60)
Glucose, Bld: 86 mg/dL (ref 70–99)
Potassium: 3.3 mmol/L — ABNORMAL LOW (ref 3.5–5.1)
Sodium: 142 mmol/L (ref 135–145)

## 2020-08-04 MED ORDER — POTASSIUM CHLORIDE CRYS ER 20 MEQ PO TBCR
40.0000 meq | EXTENDED_RELEASE_TABLET | Freq: Once | ORAL | Status: AC
  Administered 2020-08-04: 40 meq via ORAL
  Filled 2020-08-04: qty 2

## 2020-08-04 MED ORDER — SODIUM CHLORIDE 0.9 % IV SOLN
2.0000 g | Freq: Two times a day (BID) | INTRAVENOUS | Status: DC
  Filled 2020-08-04: qty 2

## 2020-08-04 MED ORDER — HALOPERIDOL LACTATE 5 MG/ML IJ SOLN: 1.0000 mg | Freq: Three times a day (TID) | INTRAMUSCULAR | Status: DC | PRN

## 2020-08-04 NOTE — Progress Notes (Signed)
PROGRESS NOTE    Patient: Michelle Gregory                            PCP: Abner Greenspan, MD                    DOB: 1923/03/04            DOA: 08/02/2020 ALP:[id number]             DOS: 08/04/2020, 12:42 PM   LOS: 2 days   Date of Service: The patient was seen and examined on 08/04/2020  Subjective:   The patient was seen and examined this morning, sleeping comfortably in bed. Per nursing staff patient was confused agitated overnight... A dose of Haldol was given earlier this morning Hemodynamically stable  Brief Narrative:   Michelle Gregory who is a 86 year old female with history of dementia, recurrent UTIs, HTN, HLD presenting with 2-day history of confusion with hallucinations, typical for her when she has UTIs.  She was admitted as she met the SIRS criteria, with lactic acidosis.  No obvious source of infection. Did not met the sepsis criteria. Acute metabolic encephalopathy in the setting of dementia.    Admitted for further work-up   Assessment & Plan:   Principal Problem:   Dementia with behavioral disturbance (HCC) Active Problems:   Recurrent UTI (urinary tract infection)   Generalized weakness   Dementia without behavioral disturbance (HCC)   Sepsis (HCC)   Lactic acidosis   Acute metabolic encephalopathy    Acute metabolic encephalopathy in the setting of dementia Possible sepsis versus lactic acidosis - Recurrent UTI (urinary tract infection) -Resolved lactic acidosis -Remains confused, agitated -Hemodynamically stable  -Lactic acidosis, likely due to dehydration poor p.o. intake, no signs of overt infection Sepsis ruled out, afebrile normotensive no leukocytosis mildly tachypneic -Lactic acid 2.4 >> 1.6, 1.3  -WBC 7.6, 9.3, 6.6 -UA; clean, no bacteria, negative for any nitrites trace of leukocyte esterase WBC 0-5 -Cultures remain negative -History of recurrent UTI with change in mental status according to daughter -Patient did not meet full sepsis  criteria on admission only SIRS -resolved -On admission was treated on broad-spectrum antibiotics of vancomycin Flagyl and cefepime .... All antibiotics will be discontinued today 08/04/2020 -As all cultures no growth to date -CT abdomen and pelvis-negative -CT of the head; never any acute findings -MRI of the brain revealing chronic changes atrophy no acute stroke -Chest x-ray negative for any acute changes chronic    Generalized weakness -Declining physically and mentally -According to daughter patient is steadily declining but more acutely over the past week unable to ambulate -Acute infection sepsis stroke was ruled out -Fall and aspiration precautions -PT OT, current recommendation SNF     Dementia without behavioral disturbance (HCC) -With behavior disturbance, agitation -Remained very confused today, follows some commands -Psych consulted: Current recommendation Remeron 15 mg p.o. nightly, risperidone 0.5 mg p.o. twice daily   Hypertension -Stable on home medication of metoprolol  Hyperlipidemia -Discontinue statins -risk outweighs the benefit at this time  Hypokalemia -Monitoring, repleting orally   DVT prophylaxis: Lovenox  Code Status: DNR Family Communication: Daughter -discussed in detail recommending palliative care -to proceed with comfort care measures-as patient is steadily declining mentally and physically  Disposition Plan:  Likely assisted living versus SNF Consults called:  Palliative care Status:At the time of admission, it appears that the appropriate admission status for this patient is INPATIENT.  Poor p.o. intake Nutritional status:   Encouraging dietary supplements      Cultures; Blood Cultures x 2 >> NGT Urine Culture  >>> NGT    Antimicrobials: 08/02/2020  >> IV antibiotics cefepime/metronidazole/vancomycin >> discontinued vancomycin and metronidazole 08/02/2020 continue cefepime >> discontinued 08/04/2020   Procedures:   No  admission procedures for hospital encounter.     Antimicrobials:  Anti-infectives (From admission, onward)   Start     Dose/Rate Route Frequency Ordered Stop   08/04/20 2200  ceFEPIme (MAXIPIME) 2 g in sodium chloride 0.9 % 100 mL IVPB  Status:  Discontinued        2 g 200 mL/hr over 30 Minutes Intravenous Every 12 hours 08/04/20 1101 08/04/20 1242   08/03/20 0600  ceFEPIme (MAXIPIME) 2 g in sodium chloride 0.9 % 100 mL IVPB  Status:  Discontinued        2 g 200 mL/hr over 30 Minutes Intravenous Every 24 hours 08/02/20 0440 08/04/20 1101   08/03/20 0400  vancomycin (VANCOREADY) IVPB 500 mg/100 mL  Status:  Discontinued        500 mg 100 mL/hr over 60 Minutes Intravenous Every 24 hours 08/02/20 0440 08/02/20 0803   08/02/20 1200  metroNIDAZOLE (FLAGYL) IVPB 500 mg  Status:  Discontinued        500 mg 100 mL/hr over 60 Minutes Intravenous Every 8 hours 08/02/20 0430 08/02/20 0804   08/02/20 0330  ceFEPIme (MAXIPIME) 2 g in sodium chloride 0.9 % 100 mL IVPB        2 g 200 mL/hr over 30 Minutes Intravenous  Once 08/02/20 0322 08/02/20 0541   08/02/20 0315  aztreonam (AZACTAM) 2 g in sodium chloride 0.9 % 100 mL IVPB  Status:  Discontinued        2 g 200 mL/hr over 30 Minutes Intravenous  Once 08/02/20 0314 08/02/20 0322   08/02/20 0315  metroNIDAZOLE (FLAGYL) IVPB 500 mg        500 mg 100 mL/hr over 60 Minutes Intravenous  Once 08/02/20 0314 08/02/20 0502   08/02/20 0315  vancomycin (VANCOCIN) IVPB 1000 mg/200 mL premix        1,000 mg 200 mL/hr over 60 Minutes Intravenous  Once 08/02/20 0314 08/02/20 0502       Medication:  . acidophilus  1 capsule Oral Daily  . allopurinol  100 mg Oral Daily  . aspirin EC  81 mg Oral Daily  . enoxaparin (LOVENOX) injection  40 mg Subcutaneous Q24H  . gabapentin  300 mg Oral BID  . memantine  5 mg Oral BID  . metoprolol tartrate  50 mg Oral BID  . mirtazapine  15 mg Oral QHS  . multivitamin with minerals  1 tablet Oral Daily  . potassium  chloride  40 mEq Oral Once  . risperiDONE  0.5 mg Oral BID    haloperidol lactate, ondansetron **OR** ondansetron (ZOFRAN) IV   Objective:   Vitals:   08/03/20 2336 08/04/20 0359 08/04/20 0806 08/04/20 1213  BP: (!) 162/94 (!) 152/90 (!) 147/89 (!) 148/99  Pulse: (!) 105 98 (!) 109 87  Resp: $Remo'18 16 19 17  'YAzNj$ Temp: 98 F (36.7 C) 98 F (36.7 C) 98 F (36.7 C) 97.9 F (36.6 C)  TempSrc: Temporal Axillary Axillary Axillary  SpO2: 99% 96% 96% 93%  Weight:      Height:        Intake/Output Summary (Last 24 hours) at 08/04/2020 1242 Last data filed at 08/04/2020 1013 Gross per 24  hour  Intake 749.72 ml  Output 0 ml  Net 749.72 ml   Filed Weights   08/01/20 1457  Weight: 49.4 kg     Examination:      Physical Exam:   General:   Awake, alert, confused -generalized cachexia  HEENT:  Normocephalic, PERRL, otherwise with in Normal limits   Neuro:  CNII-XII intact. , normal motor and sensation, reflexes intact   Lungs:   Clear to auscultation BL, Respirations unlabored, no wheezes / crackles  Cardio:    S1/S2, RRR, No murmure, No Rubs or Gallops   Abdomen:   Soft, non-tender, bowel sounds active all four quadrants,  no guarding or peritoneal signs.  Muscular skeletal:   Generalized weaknesses, Limited exam - in bed, able to move all 4 extremities, Normal strength,  2+ pulses,  symmetric, No pitting edema  Skin:  Dry, warm to touch, negative for any Rashes, No open wounds  Wounds: Please see nursing documentation            ------------------------------------------------------------------------------------------------------------------------------------------    LABs:  CBC Latest Ref Rng & Units 08/03/2020 08/02/2020 08/01/2020  WBC 4.0 - 10.5 K/uL 6.6 9.3 7.6  Hemoglobin 12.0 - 15.0 g/dL 41.0 30.1 31.4  Hematocrit 36 - 46 % 36.7 37.8 45.0  Platelets 150 - 400 K/uL 90(L) 127(L) 125(L)   CMP Latest Ref Rng & Units 08/04/2020 08/03/2020 08/02/2020  Glucose 70 - 99 mg/dL  86 84 -  BUN 8 - 23 mg/dL 12 15 -  Creatinine 3.88 - 1.00 mg/dL 8.75 7.97 2.82  Sodium 135 - 145 mmol/L 142 139 -  Potassium 3.5 - 5.1 mmol/L 3.3(L) 4.0 -  Chloride 98 - 111 mmol/L 102 107 -  CO2 22 - 32 mmol/L 30 22 -  Calcium 8.9 - 10.3 mg/dL 8.3(L) 8.2(L) -  Total Protein 6.5 - 8.1 g/dL - - -  Total Bilirubin 0.3 - 1.2 mg/dL - - -  Alkaline Phos 38 - 126 U/L - - -  AST 15 - 41 U/L - - -  ALT 0 - 44 U/L - - -       Micro Results Recent Results (from the past 240 hour(s))  Urine Culture     Status: None   Collection Time: 08/01/20  9:30 AM   Specimen: Urine  Result Value Ref Range Status   MICRO NUMBER: [id number]  Final   SPECIMEN QUALITY: Adequate  Final   Sample Source URINE  Final   STATUS: FINAL  Final   Result: No Growth  Final  Respiratory Panel by RT PCR (Flu A&B, Covid) - Nasopharyngeal Swab     Status: None   Collection Time: 08/02/20 12:07 AM   Specimen: Nasopharyngeal Swab  Result Value Ref Range Status   SARS Coronavirus 2 by RT PCR NEGATIVE NEGATIVE Final    Comment: (NOTE) SARS-CoV-2 target nucleic acids are NOT DETECTED.  The SARS-CoV-2 RNA is generally detectable in upper respiratoy specimens during the acute phase of infection. The lowest concentration of SARS-CoV-2 viral copies this assay can detect is 131 copies/mL. A negative result does not preclude SARS-Cov-2 infection and should not be used as the sole basis for treatment or other patient management decisions. A negative result may occur with  improper specimen collection/handling, submission of specimen other than nasopharyngeal swab, presence of viral mutation(s) within the areas targeted by this assay, and inadequate number of viral copies (<131 copies/mL). A negative result must be combined with clinical observations, patient history, and epidemiological information. The  expected result is Negative.  Fact Sheet for Patients:  PinkCheek.be  Fact Sheet for  Healthcare Providers:  GravelBags.it  This test is no t yet approved or cleared by the [place] FDA and  has been authorized for detection and/or diagnosis of SARS-CoV-2 by FDA under an Emergency Use Authorization (EUA). This EUA will remain  in effect (meaning this test can be used) for the duration of the COVID-19 declaration under Section 564(b)(1) of the Act, 21 U.S.C. section 360bbb-3(b)(1), unless the authorization is terminated or revoked sooner.     Influenza A by PCR NEGATIVE NEGATIVE Final   Influenza B by PCR NEGATIVE NEGATIVE Final    Comment: (NOTE) The Xpert Xpress SARS-CoV-2/FLU/RSV assay is intended as an aid in  the diagnosis of influenza from Nasopharyngeal swab specimens and  should not be used as a sole basis for treatment. Nasal washings and  aspirates are unacceptable for Xpert Xpress SARS-CoV-2/FLU/RSV  testing.  Fact Sheet for Patients: PinkCheek.be  Fact Sheet for Healthcare Providers: GravelBags.it  This test is not yet approved or cleared by the [place] FDA and  has been authorized for detection and/or diagnosis of SARS-CoV-2 by  FDA under an Emergency Use Authorization (EUA). This EUA will remain  in effect (meaning this test can be used) for the duration of the  Covid-19 declaration under Section 564(b)(1) of the Act, 21  U.S.C. section 360bbb-3(b)(1), unless the authorization is  terminated or revoked. Performed at [facility], [address] [place], [place] [address]   Culture, blood (routine x 2)     Status: None (Preliminary result)   Collection Time: 08/02/20  1:45 AM   Specimen: BLOOD  Result Value Ref Range Status   Specimen Description BLOOD BLOOD RIGHT FOREARM  Final   Special Requests   Final    BOTTLES DRAWN AEROBIC AND ANAEROBIC Blood Culture adequate volume   Culture   Final    NO GROWTH 2 DAYS Performed at [facility], [address] [place], [place] [address]    Report Status PENDING  Incomplete  Culture, blood (routine x 2)     Status: None (Preliminary result)   Collection Time: 08/02/20  1:45 AM   Specimen: BLOOD  Result Value Ref Range Status   Specimen Description BLOOD BLOOD RIGHT FOREARM  Final   Special Requests   Final    BOTTLES DRAWN AEROBIC AND ANAEROBIC Blood Culture adequate volume   Culture   Final    NO GROWTH 2 DAYS Performed at [facility], [address] [place], [place]  [address]    Report Status PENDING  Incomplete  Urine Culture     Status: None   Collection Time: 08/02/20  4:15 AM   Specimen: Urine, Catheterized  Result Value Ref Range Status   Specimen Description   Final    URINE, CATHETERIZED Performed at [facility], [address] [place], [place] [address]    Special Requests   Final    NONE Performed at [facility], [address] [place], [place] [address]    Culture   Final    NO GROWTH Performed at [facility], [place] [address] [place],  [address]    Report Status 08/03/2020 FINAL  Final    Radiology Reports CT HEAD WO CONTRAST  Result Date: 08/01/2020 CLINICAL DATA:  Neuro deficit, acute, stroke suspected 86 year old dementia patient with legs giving out while standing and confusion. EXAM: CT HEAD WITHOUT CONTRAST TECHNIQUE: Contiguous  axial images were obtained from the base of the skull through the vertex without intravenous contrast. COMPARISON:  Head CT 06/21/2019 FINDINGS: Brain: Stable degree of atrophy and chronic small vessel ischemia. No intracranial hemorrhage, mass effect, or midline shift. No hydrocephalus. The basilar cisterns are patent. No evidence of territorial infarct or acute ischemia. No extra-axial or intracranial fluid collection. Vascular: Atherosclerosis of skullbase vasculature without hyperdense vessel or abnormal calcification. Skull: No fracture or focal lesion.  Sinuses/Orbits: Paranasal sinuses and mastoid air cells are clear. The visualized orbits are unremarkable. Other: None. IMPRESSION: 1. No acute intracranial abnormality. 2. Stable atrophy and chronic small vessel ischemia. Electronically Signed   By: Keith Rake M.D.   On: 08/01/2020 15:38   MR BRAIN WO CONTRAST  Result Date: 08/03/2020 CLINICAL DATA:  Encephalopathy EXAM: MRI HEAD WITHOUT CONTRAST TECHNIQUE: Multiplanar, multiecho pulse sequences of the brain and surrounding structures were obtained without intravenous contrast. COMPARISON:  None. FINDINGS: Brain: No acute infarct, acute hemorrhage or extra-axial collection. Early confluent hyperintense T2-weighted signal of the periventricular and deep white matter. There is generalized atrophy without lobar predilection. No chronic microhemorrhage. Normal midline structures. Vascular: Normal flow voids. Skull and upper cervical spine: Normal marrow signal. Sinuses/Orbits: Negative. Other: None. IMPRESSION: 1. No acute intracranial abnormality. 2. Generalized atrophy and findings of chronic ischemic microangiopathy. Electronically Signed   By: Ulyses Jarred M.D.   On: 08/03/2020 22:16   DG Chest Port 1 View  Result Date: 08/02/2020 CLINICAL DATA:  Legs giving out.  Weakness and confusion EXAM: PORTABLE CHEST 1 VIEW COMPARISON:  05/23/2020 FINDINGS: Normal heart size and pulmonary vascularity. Bronchiectasis, peribronchial thickening, and central interstitial fibrosis consistent with chronic bronchitis. No airspace disease or consolidation. No pleural effusions. No pneumothorax. Mediastinal contours appear intact. Ectatic aorta. Degenerative changes in the spine and shoulders. IMPRESSION: Chronic bronchitic changes in the lungs. No evidence of active pulmonary disease. Electronically Signed   By: Lucienne Capers M.D.   On: 08/02/2020 02:20    SIGNED: Deatra James, MD, FACP, FHM. [facility],  Pager (please use [email or web address] to  page/text)  If 7PM-7AM, please contact night-coverage [email or web address] 08/04/2020, 12:42 PM

## 2020-08-04 NOTE — Plan of Care (Signed)
Continuing with plan of care. 

## 2020-08-05 DIAGNOSIS — F0391 Unspecified dementia with behavioral disturbance: Secondary | ICD-10-CM | POA: Diagnosis not present

## 2020-08-05 DIAGNOSIS — G309 Alzheimer's disease, unspecified: Secondary | ICD-10-CM | POA: Diagnosis not present

## 2020-08-05 DIAGNOSIS — R4182 Altered mental status, unspecified: Secondary | ICD-10-CM | POA: Diagnosis not present

## 2020-08-05 DIAGNOSIS — G9341 Metabolic encephalopathy: Secondary | ICD-10-CM | POA: Diagnosis not present

## 2020-08-05 LAB — BASIC METABOLIC PANEL
Anion gap: 13 (ref 5–15)
BUN: 12 mg/dL (ref 8–23)
CO2: 23 mmol/L (ref 22–32)
Calcium: 8 mg/dL — ABNORMAL LOW (ref 8.9–10.3)
Chloride: 103 mmol/L (ref 98–111)
Creatinine, Ser: 0.58 mg/dL (ref 0.44–1.00)
GFR calc Af Amer: >60 mL/min (ref >60)
GFR calc non Af Amer: >60 mL/min (ref >60)
Glucose, Bld: 72 mg/dL (ref 70–99)
Potassium: 3.6 mmol/L (ref 3.5–5.1)
Sodium: 139 mmol/L (ref 135–145)

## 2020-08-05 LAB — RESP PANEL BY RT PCR (RSV, FLU A&B, COVID)
Influenza A by PCR: NEGATIVE
Influenza B by PCR: NEGATIVE
Respiratory Syncytial Virus by PCR: NEGATIVE
SARS Coronavirus 2 by RT PCR: NEGATIVE

## 2020-08-05 MED ORDER — HYDROCODONE-ACETAMINOPHEN 5-325 MG PO TABS
1.0000 | ORAL_TABLET | Freq: Three times a day (TID) | ORAL | 0 refills | Status: AC | PRN
Start: 2020-08-05 — End: 2020-08-08

## 2020-08-05 MED ORDER — RISPERIDONE 1 MG/ML PO SOLN: 0.5000 mg | Freq: Two times a day (BID) | ORAL | 1 refills | Status: DC

## 2020-08-05 MED ORDER — HYDROCODONE-ACETAMINOPHEN 5-325 MG PO TABS
1.0000 | ORAL_TABLET | Freq: Four times a day (QID) | ORAL | Status: DC | PRN
  Administered 2020-08-06: 1 via ORAL
  Filled 2020-08-05: qty 1

## 2020-08-05 MED ORDER — METOPROLOL TARTRATE 50 MG PO TABS
50.0000 mg | ORAL_TABLET | Freq: Two times a day (BID) | ORAL | 2 refills | Status: DC
Start: 2020-08-05 — End: 2020-10-08

## 2020-08-05 NOTE — Progress Notes (Signed)
Physical Therapy Treatment Patient Details Name: MESHA SCHAMBERGER MRN: [id number] DOB: 1923/09/04 Today's Date: 08/05/2020    History of Present Illness 86 y.o. female with medical history significant for dementia, recurrent UTIs, HTN, HLD who presented to the emergency room with a 2 to 3-day history of altered mental status described as confusion and hallucinations, typical for her when she has UTIs.  She has had no fever, chills, cough or shortness of breath, no nausea or vomiting, abdominal pain or change in bowel habits.  History is taken from daughter over the phone due to patient's dementia.  Over the past 24 hours patient developed protracted weakness, with difficulty getting out of bed and walking which is her usual.Pt being treated for sepsis at this time.    PT Comments    Pt was very drowzey but agreed to sit up for lunch. Pt was oriented to self only. Pt did not put forth effort to participate for either rolling or coming to sit EOB and required total +2. Pt was able to maintain balance at EOB for 10 minutes. Pt able to perform LAQs and accept perturbations from therapist while keeping static sitting balance. Pt able to come up to standing with Min A but unable to maintain. Pt's knees buckled upon coming up to standing and required Mod A therapist support to remain upright. Pt will benefit from PT services in a SNF setting upon discharge to safely address deficits listed in patient problem list for decreased caregiver assistance and eventual return to PLOF.   Follow Up Recommendations  SNF     Equipment Recommendations  None recommended by PT;Other (comment) (TBD)    Recommendations for Other Services       Precautions / Restrictions Precautions Precautions: Fall Restrictions Weight Bearing Restrictions: No    Mobility  Bed Mobility Overal bed mobility: Needs Assistance Bed Mobility: Supine to Sit;Sit to Supine     Supine to sit: +2 for physical assistance;Total  assist Sit to supine: +2 for physical assistance;Total assist   General bed mobility comments: pt sis not put forth effort to participate in bed mobility  Transfers Overall transfer level: Needs assistance Equipment used: Rolling walker (2 wheeled) Transfers: Sit to/from Stand Sit to Stand: From elevated surface;Mod assist         General transfer comment: pt able to come to standing but knees buckeled while standing up  Ambulation/Gait             General Gait Details: unsafe to attempt   Stairs             Wheelchair Mobility    Modified Rankin (Stroke Patients Only)       Balance Overall balance assessment: Needs assistance Sitting-balance support: Feet supported;Bilateral upper extremity supported Sitting balance-Leahy Scale: Good Sitting balance - Comments: pt able to maintain quiet sitting balance and accept perturbations Postural control: Other (comment) (kypthotic posture)   Standing balance-Leahy Scale: Poor Standing balance comment: pt unable to maintain standing secondary to weakness in the legs                            Cognition Arousal/Alertness: Awake/alert Behavior During Therapy: WFL for tasks assessed/performed Overall Cognitive Status: History of cognitive impairments - at baseline                                 General Comments: per chart,  pt with hx of dementia. Pt very confused throughout session and oriented to self only. She reports she lives at home with her mother.      Exercises Total Joint Exercises Long Arc Quad: AROM;10 reps;Both Other Exercises Other Exercises: pt sat EOB for 10 minutes for tolerence to upright posistion Other Exercises: pt maintained static sitting balance while PT provided light perturbations for 3 minutes Other Exercises: pt performed multiple rolls L and R    General Comments        Pertinent Vitals/Pain Faces Pain Scale: Hurts a little bit Pain Location:  generalized with mobility and with laying in full supine Pain Descriptors / Indicators: Discomfort Pain Intervention(s): Limited activity within patient's tolerance;Monitored during session;Repositioned    Home Living                      Prior Function            PT Goals (current goals can now be found in the care plan section) Acute Rehab PT Goals PT Goal Formulation: Patient unable to participate in goal setting Time For Goal Achievement: 08/16/20    Frequency    Min 2X/week      PT Plan Current plan remains appropriate    Co-evaluation              AM-PAC PT "6 Clicks" Mobility   Outcome Measure  Help needed turning from your back to your side while in a flat bed without using bedrails?: A Little Help needed moving from lying on your back to sitting on the side of a flat bed without using bedrails?: A Lot Help needed moving to and from a bed to a chair (including a wheelchair)?: A Lot Help needed standing up from a chair using your arms (e.g., wheelchair or bedside chair)?: A Little Help needed to walk in hospital room?: Total Help needed climbing 3-5 steps with a railing? : Total 6 Click Score: 12    End of Session Equipment Utilized During Treatment: Gait belt Activity Tolerance: Patient limited by lethargy;Patient limited by fatigue Patient left: in bed;with call bell/phone within reach;with bed alarm set;with family/visitor present Nurse Communication: Mobility status PT Visit Diagnosis: Unsteadiness on feet (R26.81);Muscle weakness (generalized) (M62.81);Other abnormalities of gait and mobility (R26.89);Difficulty in walking, not elsewhere classified (R26.2)     Time: 4742-5956 PT Time Calculation (min) (ACUTE ONLY): 38 min  Charges:                        Hervey Ard, SPT 08/05/20. 3:47 PM

## 2020-08-05 NOTE — Discharge Summary (Addendum)
Physician Discharge Summary [facility]    Patient: Michelle Gregory                   Admit date: 08/02/2020   DOB: Dec 26, 1922             Discharge date:08/05/2020/8:46 AM LDJ:[id number]                          PCP: Abner Greenspan, MD  Disposition: SNF  Recommendations for Outpatient Follow-up:   . Follow up: in 1 week  Discharge Condition: Stable   Code Status:   Code Status: DNR  Diet recommendation: Regular healthy diet   Discharge Diagnoses:    Principal Problem:   Dementia with behavioral disturbance ([place]) Active Problems:   Recurrent UTI (urinary tract infection)   Generalized weakness   Dementia without behavioral disturbance (HCC)   Sepsis ([place])   Lactic acidosis   Acute metabolic encephalopathy   History of Present Illness/ Hospital Course Kathleen Argue Summary:   [name]. [name] who is a 86 year old female with history of dementia, recurrent UTIs, HTN, HLD presenting with 2-day history of confusion with hallucinations, typical for her when she has UTIs.  She was admitted as she met the SIRS criteria, with lactic acidosis.  No obvious source of infection. Did not met the sepsis criteria. Acute metabolic encephalopathy in the setting of dementia.   Admitted for further work-up    Acute metabolic encephalopathy in the setting of dementia Possible sepsis versus lactic acidosis - Recurrent UTI (urinary tract infection) -Resolved lactic acidosis -Remains confused  -Hemodynamically stable  -Lactic acidosis, likely due to dehydration poor p.o. intake, no signs of overt infection Sepsis ruled out, afebrile normotensive no leukocytosis mildly tachypneic -Lactic acid 2.4 >> 1.6, 1.3  -WBC 7.6, 9.3, 6.6 -UA; clean, no bacteria, negative for any nitrites trace of leukocyte esterase WBC 0-5 -Cultures remain negative -History of recurrent UTI with change in mental status according to daughter -Patient did not meet full sepsis criteria on admission only SIRS  -resolved -On admission was treated on broad-spectrum antibiotics of vancomycin Flagyl and cefepime .... All antibiotics will be discontinued today 08/04/2020 -As all cultures no growth to date -CT abdomen and pelvis-negative -CT of the head; never any acute findings -MRI of the brain revealing chronic changes atrophy no acute stroke -Chest x-ray negative for any acute changes chronic  Generalized weakness -Declining physically and mentally -According to daughter patient is steadily declining but more acutely over the past week unable to ambulate -Acute infection sepsis stroke was ruled out -Fall and aspiration precautions -PT OT, current recommendation SNF   Dementia without behavioral disturbance (HCC) -With behavior disturbance, agitation -Remained very confused today, follows some commands -Psych consulted: Current recommendation Remeron 15 mg p.o. nightly, risperidone 0.5 mg p.o. twice daily  Hypertension -Stable on home medication of metoprolol  Hyperlipidemia -Discontinue statins -risk outweighs the benefit at this time  Hypokalemia -Monitoring, repleting orally    Code Status:DNR Family Communication:Daughter -discussed in detail recommending palliative care -to proceed with comfort care measures-as patient is steadily declining mentally and physically  Disposition Plan: Likely assisted living versus SNF Consults called: Palliative care Status:At the time of admission, it appears that the appropriate admission status for this patient is INPATIENT.     Poor p.o. intake Nutritional status:   Encouraging dietary supplements  Cultures; Blood Cultures x 2 >> NGT Urine Culture  >>> NGT    Antimicrobials: 08/02/2020  >>  IV antibiotics cefepime/metronidazole/vancomycin >> discontinued vancomycin and metronidazole 08/02/2020 continue cefepime >> discontinued 08/04/2020      Nutritional status:          Discharge Instructions:    Discharge Instructions    Activity as tolerated - No restrictions   Complete by: As directed    Diet - low sodium heart healthy   Complete by: As directed    Discharge instructions   Complete by: As directed    Follow with palliative care ... At SNF   Increase activity slowly   Complete by: As directed        Medication List    STOP taking these medications   gabapentin 300 MG capsule Commonly known as: NEURONTIN   HYDROcodone-acetaminophen 5-325 MG tablet Commonly known as: NORCO/VICODIN   psyllium 0.52 g capsule Commonly known as: REGULOID   QUEtiapine 25 MG tablet Commonly known as: SEROquel     TAKE these medications   allopurinol 100 MG tablet Commonly known as: ZYLOPRIM TAKE 2 TABLETS BY MOUTH  DAILY What changed: how much to take   aspirin 81 MG tablet Take 81 mg by mouth daily.   calcium-vitamin D 500 MG tablet Take 1 tablet by mouth daily.   CRANBERRY PO Take 1 capsule by mouth 2 (two) times daily.   memantine 5 MG tablet Commonly known as: Namenda Take 1 tablet (5 mg total) by mouth 2 (two) times daily.   metoprolol tartrate 50 MG tablet Commonly known as: LOPRESSOR Take 1 tablet (50 mg total) by mouth 2 (two) times daily.   mirtazapine 15 MG tablet Commonly known as: Remeron Take 1 tablet (15 mg total) by mouth at bedtime.   multivitamin per tablet Take 1 tablet by mouth daily.   PROBIOTIC DAILY PO Take 1 tablet by mouth daily.   risperiDONE 1 MG/ML oral solution Commonly known as: RISPERDAL Take 0.5 mLs (0.5 mg total) by mouth 2 (two) times daily.   vitamin C 100 MG tablet Take 100 mg by mouth daily.       Allergies  Allergen Reactions  . Amoxicillin-Pot Clavulanate Diarrhea    Has patient had a PCN reaction causing immediate rash, facial/tongue/throat swelling, SOB or lightheadedness with hypotension: Yes Has patient had a PCN reaction causing severe rash involving mucus membranes or skin necrosis: No Has patient had a PCN  reaction that required hospitalization: No Has patient had a PCN reaction occurring within the last 10 years: No If all of the above answers are "NO", then may proceed with Cephalosporin use.   Marland Kitchen Keflex [Cephalexin] Diarrhea  . Septra [Sulfamethoxazole-Trimethoprim] Nausea Only     Procedures /Studies:   CT HEAD WO CONTRAST  Result Date: 08/01/2020 CLINICAL DATA:  Neuro deficit, acute, stroke suspected 86 year old dementia patient with legs giving out while standing and confusion. EXAM: CT HEAD WITHOUT CONTRAST TECHNIQUE: Contiguous axial images were obtained from the base of the skull through the vertex without intravenous contrast. COMPARISON:  Head CT 06/21/2019 FINDINGS: Brain: Stable degree of atrophy and chronic small vessel ischemia. No intracranial hemorrhage, mass effect, or midline shift. No hydrocephalus. The basilar cisterns are patent. No evidence of territorial infarct or acute ischemia. No extra-axial or intracranial fluid collection. Vascular: Atherosclerosis of skullbase vasculature without hyperdense vessel or abnormal calcification. Skull: No fracture or focal lesion. Sinuses/Orbits: Paranasal sinuses and mastoid air cells are clear. The visualized orbits are unremarkable. Other: None. IMPRESSION: 1. No acute intracranial abnormality. 2. Stable atrophy and chronic small vessel ischemia. Electronically Signed  By: Keith Rake M.D.   On: 08/01/2020 15:38   MR BRAIN WO CONTRAST  Result Date: 08/03/2020 CLINICAL DATA:  Encephalopathy EXAM: MRI HEAD WITHOUT CONTRAST TECHNIQUE: Multiplanar, multiecho pulse sequences of the brain and surrounding structures were obtained without intravenous contrast. COMPARISON:  None. FINDINGS: Brain: No acute infarct, acute hemorrhage or extra-axial collection. Early confluent hyperintense T2-weighted signal of the periventricular and deep white matter. There is generalized atrophy without lobar predilection. No chronic microhemorrhage. Normal  midline structures. Vascular: Normal flow voids. Skull and upper cervical spine: Normal marrow signal. Sinuses/Orbits: Negative. Other: None. IMPRESSION: 1. No acute intracranial abnormality. 2. Generalized atrophy and findings of chronic ischemic microangiopathy. Electronically Signed   By: Ulyses Jarred M.D.   On: 08/03/2020 22:16   DG Chest Port 1 View  Result Date: 08/02/2020 CLINICAL DATA:  Legs giving out.  Weakness and confusion EXAM: PORTABLE CHEST 1 VIEW COMPARISON:  05/23/2020 FINDINGS: Normal heart size and pulmonary vascularity. Bronchiectasis, peribronchial thickening, and central interstitial fibrosis consistent with chronic bronchitis. No airspace disease or consolidation. No pleural effusions. No pneumothorax. Mediastinal contours appear intact. Ectatic aorta. Degenerative changes in the spine and shoulders. IMPRESSION: Chronic bronchitic changes in the lungs. No evidence of active pulmonary disease. Electronically Signed   By: Lucienne Capers M.D.   On: 08/02/2020 02:20    Subjective:   Patient was seen and examined 08/05/2020, 8:46 AM Patient stable today. No acute distress.  No issues overnight Stable for discharge.  Discharge Exam:    Vitals:   08/04/20 2025 08/04/20 2345 08/05/20 0524 08/05/20 0800  BP: (!) 151/89 137/81 (!) 152/79 (!) 143/85  Pulse: (!) 108 (!) 103 (!) 103   Resp: _0 Temp: 98.2 F (36.8 C) 98.7 F (37.1 C) 98.6 F (37 C) 98.7 F (37.1 C)  TempSrc: Axillary Oral Oral Axillary  SpO2: 95% 96% 97% 98%  Weight:   48.4 kg   Height:        General: Pt lying comfortably in bed & appears in no obvious distress. Cardiovascular: S1 & S2 heard, RRR, S1/S2 +. No murmurs, rubs, gallops or clicks. No JVD or pedal edema. Respiratory: Clear to auscultation without wheezing, rhonchi or crackles. No increased work of breathing. Abdominal:  Non-distended, non-tender & soft. No organomegaly or masses appreciated. Normal bowel sounds heard. CNS: Alert  and oriented. No focal deficits. Extremities: no edema, no cyanosis   The results of significant diagnostics from this hospitalization (including imaging, microbiology, ancillary and laboratory) are listed below for reference.      Microbiology:   Recent Results (from the past 240 hour(s))  Urine Culture     Status: None   Collection Time: 08/01/20  9:30 AM   Specimen: Urine  Result Value Ref Range Status   MICRO NUMBER: [id number]  Final   SPECIMEN QUALITY: Adequate  Final   Sample Source URINE  Final   STATUS: FINAL  Final   Result: No Growth  Final  Respiratory Panel by RT PCR (Flu A&B, Covid) - Nasopharyngeal Swab     Status: None   Collection Time: 08/02/20 12:07 AM   Specimen: Nasopharyngeal Swab  Result Value Ref Range Status   SARS Coronavirus 2 by RT PCR NEGATIVE NEGATIVE Final    Comment: (NOTE) SARS-CoV-2 target nucleic acids are NOT DETECTED.  The SARS-CoV-2 RNA is generally detectable in upper respiratoy specimens during the acute phase of infection. The lowest concentration of SARS-CoV-2 viral copies this assay can detect is 131 copies/mL.  A negative result does not preclude SARS-Cov-2 infection and should not be used as the sole basis for treatment or other patient management decisions. A negative result may occur with  improper specimen collection/handling, submission of specimen other than nasopharyngeal swab, presence of viral mutation(s) within the areas targeted by this assay, and inadequate number of viral copies (<131 copies/mL). A negative result must be combined with clinical observations, patient history, and epidemiological information. The expected result is Negative.  Fact Sheet for Patients:  PinkCheek.be  Fact Sheet for Healthcare Providers:  GravelBags.it  This test is no t yet approved or cleared by the [place] FDA and  has been authorized for detection and/or diagnosis of  SARS-CoV-2 by FDA under an Emergency Use Authorization (EUA). This EUA will remain  in effect (meaning this test can be used) for the duration of the COVID-19 declaration under Section 564(b)(1) of the Act, 21 U.S.C. section 360bbb-3(b)(1), unless the authorization is terminated or revoked sooner.     Influenza A by PCR NEGATIVE NEGATIVE Final   Influenza B by PCR NEGATIVE NEGATIVE Final    Comment: (NOTE) The Xpert Xpress SARS-CoV-2/FLU/RSV assay is intended as an aid in  the diagnosis of influenza from Nasopharyngeal swab specimens and  should not be used as a sole basis for treatment. Nasal washings and  aspirates are unacceptable for Xpert Xpress SARS-CoV-2/FLU/RSV  testing.  Fact Sheet for Patients: PinkCheek.be  Fact Sheet for Healthcare Providers: GravelBags.it  This test is not yet approved or cleared by the [place] FDA and  has been authorized for detection and/or diagnosis of SARS-CoV-2 by  FDA under an Emergency Use Authorization (EUA). This EUA will remain  in effect (meaning this test can be used) for the duration of the  Covid-19 declaration under Section 564(b)(1) of the Act, 21  U.S.C. section 360bbb-3(b)(1), unless the authorization is  terminated or revoked. Performed at [facility], [address] [place], [place] [address]   Culture, blood (routine x 2)     Status: None (Preliminary result)   Collection Time: 08/02/20  1:45 AM   Specimen: BLOOD  Result Value Ref Range Status   Specimen Description BLOOD BLOOD RIGHT FOREARM  Final   Special Requests   Final    BOTTLES DRAWN AEROBIC AND ANAEROBIC Blood Culture adequate volume   Culture   Final    NO GROWTH 3 DAYS Performed at [facility], [address], [place], [place] [address]    Report Status PENDING  Incomplete  Culture, blood (routine x 2)     Status: None (Preliminary result)   Collection Time: 08/02/20  1:45  AM   Specimen: BLOOD  Result Value Ref Range Status   Specimen Description BLOOD BLOOD RIGHT FOREARM  Final   Special Requests   Final    BOTTLES DRAWN AEROBIC AND ANAEROBIC Blood Culture adequate volume   Culture   Final    NO GROWTH 3 DAYS Performed at [facility], [address], [place], [place] [address]    Report Status PENDING  Incomplete  Urine Culture     Status: None   Collection Time: 08/02/20  4:15 AM   Specimen: Urine, Catheterized  Result Value Ref Range Status   Specimen Description   Final    URINE, CATHETERIZED Performed at [facility], [address], [place], [place] [address]    Special Requests   Final    NONE Performed at [facility], [address] [place],  [place] [address]    Culture   Final    NO GROWTH Performed at [facility], [place] [address], [place], [place] [address]    Report Status 08/03/2020 FINAL  Final     Labs:   CBC: Recent Labs  Lab 08/01/20 1542 08/02/20 0502 08/03/20 1733  WBC 7.6 9.3 6.6  NEUTROABS 5.7  --  4.9  HGB 15.0 13.4 12.8  HCT 45.0 37.8 36.7  MCV 98.3 95.9 93.9  PLT 125* 127* 90*   Basic Metabolic Panel: Recent Labs  Lab 08/01/20 1542 08/02/20 0502 08/03/20 0430 08/04/20 0442 08/05/20 0520  NA 142  --  139 142 139  K 4.1  --  4.0 3.3* 3.6  CL 99  --  107 102 103  CO2 29  --  _0 GLUCOSE 106*  --  84 86 72  BUN 21  --  _1 CREATININE 0.87 0.79 0.70 0.59 0.58  CALCIUM 9.6  --  8.2* 8.3* 8.0*   Liver Function Tests: Recent Labs  Lab 08/01/20 1542  AST 21  ALT 13  ALKPHOS 67  BILITOT 1.1  PROT 7.1  ALBUMIN 4.3   BNP (last 3 results) No results for input(s): BNP in the last 8760 hours. Cardiac Enzymes: No results for input(s): CKTOTAL, CKMB, CKMBINDEX, TROPONINI in the last 168 hours. CBG: No results for input(s): GLUCAP in the last 168 hours. Hgb A1c No results for input(s): HGBA1C in the last 72 hours. Lipid Profile No results for  input(s): CHOL, HDL, LDLCALC, TRIG, CHOLHDL, LDLDIRECT in the last 72 hours. Thyroid function studies No results for input(s): TSH, T4TOTAL, T3FREE, THYROIDAB in the last 72 hours.  Invalid input(s): FREET3 Anemia work up No results for input(s): VITAMINB12, FOLATE, FERRITIN, TIBC, IRON, RETICCTPCT in the last 72 hours. Urinalysis    Component Value Date/Time   COLORURINE YELLOW (A) 08/02/2020 0145   APPEARANCEUR CLEAR (A) 08/02/2020 0145   LABSPEC 1.017 08/02/2020 0145   PHURINE 6.0 08/02/2020 0145   GLUCOSEU NEGATIVE 08/02/2020 0145   HGBUR NEGATIVE 08/02/2020 0145   HGBUR trace-lysed 12/12/2010 1509   BILIRUBINUR NEGATIVE 08/02/2020 0145   BILIRUBINUR Negative 08/01/2020 0922   KETONESUR 5 (A) 08/02/2020 0145   PROTEINUR NEGATIVE 08/02/2020 0145   UROBILINOGEN 0.2 08/01/2020 0922   UROBILINOGEN 0.2 12/12/2010 1509   NITRITE NEGATIVE 08/02/2020 0145   LEUKOCYTESUR NEGATIVE 08/02/2020 0145         Time coordinating discharge: Over 55 minutes  SIGNED: Deatra James, MD, FACP, FHM. [facility],  Please use [email or web address] to Page If 7PM-7AM, please contact night-coverage [email or web address] Ota [facility] 08/05/2020, 8:46 AM

## 2020-08-05 NOTE — Care Management Important Message (Signed)
Important Message  Patient Details  Name: Michelle Gregory MRN: [id number] Date of Birth: 1922/12/08   Medicare Important Message Given:  Yes     Dannette Barbara 08/05/2020, 12:56 PM

## 2020-08-05 NOTE — Progress Notes (Addendum)
Daughter was called at her cell phone no answer below message was left...   Ms. Michelle Gregory this is Dr. Roger Shelter calling to update you regarding your mom she is better today unfortunately I can discharge her to this facility as she still has a sitter at the bedside which we will discontinue today as it looks like current medication has stabilized her mood, also no growth on cultures thus far as infections we will  discontinue all antibiotics today. Hopefully she will stay stable enough for discharge in the morning. If you have any question please give me a call .Marland KitchenThank you

## 2020-08-06 DIAGNOSIS — R1312 Dysphagia, oropharyngeal phase: Secondary | ICD-10-CM | POA: Diagnosis not present

## 2020-08-06 DIAGNOSIS — F413 Other mixed anxiety disorders: Secondary | ICD-10-CM | POA: Diagnosis not present

## 2020-08-06 DIAGNOSIS — M810 Age-related osteoporosis without current pathological fracture: Secondary | ICD-10-CM | POA: Diagnosis not present

## 2020-08-06 DIAGNOSIS — R4182 Altered mental status, unspecified: Secondary | ICD-10-CM | POA: Diagnosis not present

## 2020-08-06 DIAGNOSIS — M1990 Unspecified osteoarthritis, unspecified site: Secondary | ICD-10-CM | POA: Diagnosis not present

## 2020-08-06 DIAGNOSIS — R531 Weakness: Secondary | ICD-10-CM | POA: Diagnosis not present

## 2020-08-06 DIAGNOSIS — F32A Depression, unspecified: Secondary | ICD-10-CM | POA: Diagnosis not present

## 2020-08-06 DIAGNOSIS — M6281 Muscle weakness (generalized): Secondary | ICD-10-CM | POA: Diagnosis not present

## 2020-08-06 DIAGNOSIS — N390 Urinary tract infection, site not specified: Secondary | ICD-10-CM | POA: Diagnosis not present

## 2020-08-06 DIAGNOSIS — A419 Sepsis, unspecified organism: Secondary | ICD-10-CM | POA: Diagnosis not present

## 2020-08-06 DIAGNOSIS — E872 Acidosis: Secondary | ICD-10-CM | POA: Diagnosis not present

## 2020-08-06 DIAGNOSIS — F0391 Unspecified dementia with behavioral disturbance: Secondary | ICD-10-CM | POA: Diagnosis not present

## 2020-08-06 DIAGNOSIS — K219 Gastro-esophageal reflux disease without esophagitis: Secondary | ICD-10-CM | POA: Diagnosis not present

## 2020-08-06 DIAGNOSIS — I10 Essential (primary) hypertension: Secondary | ICD-10-CM | POA: Diagnosis not present

## 2020-08-06 DIAGNOSIS — R278 Other lack of coordination: Secondary | ICD-10-CM | POA: Diagnosis not present

## 2020-08-06 DIAGNOSIS — Z20822 Contact with and (suspected) exposure to covid-19: Secondary | ICD-10-CM | POA: Diagnosis not present

## 2020-08-06 DIAGNOSIS — Z7401 Bed confinement status: Secondary | ICD-10-CM | POA: Diagnosis not present

## 2020-08-06 DIAGNOSIS — E782 Mixed hyperlipidemia: Secondary | ICD-10-CM | POA: Diagnosis not present

## 2020-08-06 DIAGNOSIS — M2550 Pain in unspecified joint: Secondary | ICD-10-CM | POA: Diagnosis not present

## 2020-08-06 DIAGNOSIS — G629 Polyneuropathy, unspecified: Secondary | ICD-10-CM | POA: Diagnosis not present

## 2020-08-06 DIAGNOSIS — F329 Major depressive disorder, single episode, unspecified: Secondary | ICD-10-CM | POA: Diagnosis not present

## 2020-08-06 DIAGNOSIS — M109 Gout, unspecified: Secondary | ICD-10-CM | POA: Diagnosis not present

## 2020-08-06 DIAGNOSIS — R2681 Unsteadiness on feet: Secondary | ICD-10-CM | POA: Diagnosis not present

## 2020-08-06 DIAGNOSIS — F0390 Unspecified dementia without behavioral disturbance: Secondary | ICD-10-CM | POA: Diagnosis not present

## 2020-08-06 DIAGNOSIS — Z743 Need for continuous supervision: Secondary | ICD-10-CM | POA: Diagnosis not present

## 2020-08-06 DIAGNOSIS — F0280 Dementia in other diseases classified elsewhere without behavioral disturbance: Secondary | ICD-10-CM | POA: Diagnosis not present

## 2020-08-06 DIAGNOSIS — G9341 Metabolic encephalopathy: Secondary | ICD-10-CM | POA: Diagnosis not present

## 2020-08-06 DIAGNOSIS — R404 Transient alteration of awareness: Secondary | ICD-10-CM | POA: Diagnosis not present

## 2020-08-06 DIAGNOSIS — G309 Alzheimer's disease, unspecified: Secondary | ICD-10-CM | POA: Diagnosis not present

## 2020-08-06 LAB — BASIC METABOLIC PANEL
Anion gap: 11 (ref 5–15)
BUN: 12 mg/dL (ref 8–23)
CO2: 24 mmol/L (ref 22–32)
Calcium: 8.2 mg/dL — ABNORMAL LOW (ref 8.9–10.3)
Chloride: 105 mmol/L (ref 98–111)
Creatinine, Ser: 0.6 mg/dL (ref 0.44–1.00)
GFR calc Af Amer: >60 mL/min (ref >60)
GFR calc non Af Amer: >60 mL/min (ref >60)
Glucose, Bld: 97 mg/dL (ref 70–99)
Potassium: 3.2 mmol/L — ABNORMAL LOW (ref 3.5–5.1)
Sodium: 140 mmol/L (ref 135–145)

## 2020-08-06 MED ORDER — POTASSIUM CHLORIDE CRYS ER 20 MEQ PO TBCR
40.0000 meq | EXTENDED_RELEASE_TABLET | Freq: Once | ORAL | Status: AC
  Administered 2020-08-06: 40 meq via ORAL
  Filled 2020-08-06: qty 2

## 2020-08-06 NOTE — Progress Notes (Signed)
PT d/c'd via ems

## 2020-08-06 NOTE — TOC Transition Note (Signed)
Transition of Care [facility]) - CM/SW Discharge Note   Patient Details  Name: Michelle Gregory MRN: [id number] Date of Birth: 1923-07-10  [facility] of [facility]) CM/SW Contact:  Candie Chroman, LCSW Phone Number: 08/06/2020, 10:36 AM   Clinical Narrative:  Patient has orders to discharge to [facility] today. RN will call report to [phone] (Room 104). EMS transport set up for 11:45 am. No further concerns. CSW signing off.   Final next level of care: [place] Barriers to Discharge: Barriers Resolved   Patient Goals and CMS Choice Patient states their goals for this hospitalization and ongoing recovery are:: [facility] Gregory is the preferred SNF placement. CMS Medicare.gov Compare Post Acute Care list provided to:: Patient Represenative (must comment) (Michelle Gregory,Michelle Gregory (Daughter) [phone]) Choice offered to / list presented to : Adult Children  Discharge Placement   Existing PASRR number confirmed : 08/02/20          Patient chooses bed at: [facility] Patient to be transferred to facility by: EMS Name of family member notified: Michelle Gregory Patient and family notified of of transfer: 08/06/20  Discharge Plan and Services In-house Referral: Clinical Social Work                                   Social Determinants of Health (SDOH) Interventions     Readmission Risk Interventions No flowsheet data found.

## 2020-08-06 NOTE — Discharge Summary (Signed)
Physician Discharge Summary [facility]    Patient: Michelle Gregory                   Admit date: 08/02/2020   DOB: [birth year]             Discharge date:08/06/2020/10:04 AM ZOX:[id number]                          PCP: Abner Greenspan, MD  Disposition: SNF  Recommendations for Outpatient Follow-up:   . Follow up: in 1 week  Discharge Condition: Stable   Code Status:   Code Status: DNR  Diet recommendation: Regular healthy diet   The patient was seen and examined today. Stable.  No Changes ... cleared for Discharge.   Discharge Diagnoses:    Principal Problem:   Dementia with behavioral disturbance ([place]) Active Problems:   Recurrent UTI (urinary tract infection)   Generalized weakness   Dementia without behavioral disturbance (HCC)   Sepsis ([place])   Lactic acidosis   Acute metabolic encephalopathy   History of Present Illness/ Hospital Course Michelle Gregory Summary:   Michelle Gregory. Michelle Gregory who is a 86 year old female with history of dementia, recurrent UTIs, HTN, HLD presenting with 2-day history of confusion with hallucinations, typical for her when she has UTIs.  She was admitted as she met the SIRS criteria, with lactic acidosis.  No obvious source of infection. Did not met the sepsis criteria. Acute metabolic encephalopathy in the setting of dementia.   Admitted for further work-up    Acute metabolic encephalopathy in the setting of dementia Possible sepsis versus lactic acidosis - Recurrent UTI (urinary tract infection) -Resolved lactic acidosis -Remains confused  -Hemodynamically stable  -Lactic acidosis, likely due to dehydration poor p.o. intake, no signs of overt infection Sepsis ruled out, afebrile normotensive no leukocytosis mildly tachypneic -Lactic acid 2.4 >> 1.6, 1.3  -WBC 7.6, 9.3, 6.6 -UA; clean, no bacteria, negative for any nitrites trace of leukocyte esterase WBC 0-5 -Cultures remain negative -History of recurrent UTI with change in mental  status according to daughter -Patient did not meet full sepsis criteria on admission only SIRS -resolved -On admission was treated on broad-spectrum antibiotics of vancomycin Flagyl and cefepime .... All antibiotics will be discontinued today 08/04/2020 -As all cultures no growth to date -CT abdomen and pelvis-negative -CT of the head; never any acute findings -MRI of the brain revealing chronic changes atrophy no acute stroke -Chest x-ray negative for any acute changes chronic  Generalized weakness -Declining physically and mentally -According to daughter patient is steadily declining but more acutely over the past week unable to ambulate -Acute infection sepsis stroke was ruled out -Fall and aspiration precautions -PT OT, current recommendation SNF   Dementia without behavioral disturbance (HCC) -With behavior disturbance, agitation -Remained very confused today, follows some commands -Psych consulted: Current recommendation Remeron 15 mg p.o. nightly, risperidone 0.5 mg p.o. twice daily  Hypertension -Stable on home medication of metoprolol  Hyperlipidemia -Discontinue statins -risk outweighs the benefit at this time  Hypokalemia -Monitoring, repleting orally    Code Status:DNR Family Communication:Daughter -discussed in detail recommending palliative care -to proceed with comfort care measures-as patient is steadily declining mentally and physically  Disposition Plan: Likely assisted living versus SNF Consults called: Palliative care Status:At the time of admission, it appears that the appropriate admission status for this patient is INPATIENT.     Poor p.o. intake Nutritional status:   Encouraging dietary supplements  Cultures;  Blood Cultures x 2 >> NGT Urine Culture  >>> NGT    Antimicrobials: 08/02/2020  >> IV antibiotics cefepime/metronidazole/vancomycin >> discontinued vancomycin and metronidazole 08/02/2020 continue cefepime >>  discontinued 08/04/2020      Nutritional status:          Discharge Instructions:   Discharge Instructions    Activity as tolerated - No restrictions   Complete by: As directed    Activity as tolerated - No restrictions   Complete by: As directed    Diet - low sodium heart healthy   Complete by: As directed    Discharge instructions   Complete by: As directed    Follow with palliative care ... At SNF   Increase activity slowly   Complete by: As directed    Increase activity slowly   Complete by: As directed        Medication List    STOP taking these medications   gabapentin 300 MG capsule Commonly known as: NEURONTIN   psyllium 0.52 g capsule Commonly known as: REGULOID   QUEtiapine 25 MG tablet Commonly known as: SEROquel     TAKE these medications   allopurinol 100 MG tablet Commonly known as: ZYLOPRIM TAKE 2 TABLETS BY MOUTH  DAILY What changed: how much to take   aspirin 81 MG tablet Take 81 mg by mouth daily.   calcium-vitamin D 500 MG tablet Take 1 tablet by mouth daily.   CRANBERRY PO Take 1 capsule by mouth 2 (two) times daily.   HYDROcodone-acetaminophen 5-325 MG tablet Commonly known as: NORCO/VICODIN Take 1 tablet by mouth every 8 (eight) hours as needed for up to 3 days for severe pain. What changed: reasons to take this   memantine 5 MG tablet Commonly known as: Namenda Take 1 tablet (5 mg total) by mouth 2 (two) times daily.   metoprolol tartrate 50 MG tablet Commonly known as: LOPRESSOR Take 1 tablet (50 mg total) by mouth 2 (two) times daily.   mirtazapine 15 MG tablet Commonly known as: Remeron Take 1 tablet (15 mg total) by mouth at bedtime.   multivitamin per tablet Take 1 tablet by mouth daily.   PROBIOTIC DAILY PO Take 1 tablet by mouth daily.   risperiDONE 1 MG/ML oral solution Commonly known as: RISPERDAL Take 0.5 mLs (0.5 mg total) by mouth 2 (two) times daily.   vitamin C 100 MG tablet Take 100 mg by  mouth daily.       Contact information for after-discharge care    Destination    [facility] .   Service: Skilled Nursing Contact information: [address] [place] [phone]                 Allergies  Allergen Reactions  . Amoxicillin-Pot Clavulanate Diarrhea    Has patient had a PCN reaction causing immediate rash, facial/tongue/throat swelling, SOB or lightheadedness with hypotension: Yes Has patient had a PCN reaction causing severe rash involving mucus membranes or skin necrosis: No Has patient had a PCN reaction that required hospitalization: No Has patient had a PCN reaction occurring within the last 10 years: No If all of the above answers are "NO", then may proceed with Cephalosporin use.   Marland Kitchen Keflex [Cephalexin] Diarrhea  . Septra [Sulfamethoxazole-Trimethoprim] Nausea Only     Procedures /Studies:   CT HEAD WO CONTRAST  Result Date: 08/01/2020 CLINICAL DATA:  Neuro deficit, acute, stroke suspected 86 year old dementia patient with legs giving out while standing and  confusion. EXAM: CT HEAD WITHOUT CONTRAST TECHNIQUE: Contiguous axial images were obtained from the base of the skull through the vertex without intravenous contrast. COMPARISON:  Head CT 06/21/2019 FINDINGS: Brain: Stable degree of atrophy and chronic small vessel ischemia. No intracranial hemorrhage, mass effect, or midline shift. No hydrocephalus. The basilar cisterns are patent. No evidence of territorial infarct or acute ischemia. No extra-axial or intracranial fluid collection. Vascular: Atherosclerosis of skullbase vasculature without hyperdense vessel or abnormal calcification. Skull: No fracture or focal lesion. Sinuses/Orbits: Paranasal sinuses and mastoid air cells are clear. The visualized orbits are unremarkable. Other: None. IMPRESSION: 1. No acute intracranial abnormality. 2. Stable atrophy and chronic small vessel ischemia.  Electronically Signed   By: Keith Rake M.D.   On: 08/01/2020 15:38   MR BRAIN WO CONTRAST  Result Date: 08/03/2020 CLINICAL DATA:  Encephalopathy EXAM: MRI HEAD WITHOUT CONTRAST TECHNIQUE: Multiplanar, multiecho pulse sequences of the brain and surrounding structures were obtained without intravenous contrast. COMPARISON:  None. FINDINGS: Brain: No acute infarct, acute hemorrhage or extra-axial collection. Early confluent hyperintense T2-weighted signal of the periventricular and deep white matter. There is generalized atrophy without lobar predilection. No chronic microhemorrhage. Normal midline structures. Vascular: Normal flow voids. Skull and upper cervical spine: Normal marrow signal. Sinuses/Orbits: Negative. Other: None. IMPRESSION: 1. No acute intracranial abnormality. 2. Generalized atrophy and findings of chronic ischemic microangiopathy. Electronically Signed   By: Ulyses Jarred M.D.   On: 08/03/2020 22:16   DG Chest Port 1 View  Result Date: 08/02/2020 CLINICAL DATA:  Legs giving out.  Weakness and confusion EXAM: PORTABLE CHEST 1 VIEW COMPARISON:  05/23/2020 FINDINGS: Normal heart size and pulmonary vascularity. Bronchiectasis, peribronchial thickening, and central interstitial fibrosis consistent with chronic bronchitis. No airspace disease or consolidation. No pleural effusions. No pneumothorax. Mediastinal contours appear intact. Ectatic aorta. Degenerative changes in the spine and shoulders. IMPRESSION: Chronic bronchitic changes in the lungs. No evidence of active pulmonary disease. Electronically Signed   By: Lucienne Capers M.D.   On: 08/02/2020 02:20    Subjective:   Patient was seen and examined 08/06/2020, 10:04 AM Patient stable today. No acute distress.  No issues overnight Stable for discharge.  Discharge Exam:    Vitals:   08/05/20 2030 08/05/20 2341 08/06/20 0626 08/06/20 0815  BP: (!) 141/95 (!) 145/97 (!) 162/98 (!) 168/102  Pulse: 60 (!) 102 (!) 103 (!) 103   Resp: $Remo'16 20 16 17  'BvAyY$ Temp: 97.9 F (36.6 C) 98.8 F (37.1 C) 97.8 F (36.6 C) 97.7 F (36.5 C)  TempSrc: Oral Oral Oral Oral  SpO2: 95% 94% 95% 97%  Weight:      Height:        General: Pt lying comfortably in bed & appears in no obvious distress. Cardiovascular: S1 & S2 heard, RRR, S1/S2 +. No murmurs, rubs, gallops or clicks. No JVD or pedal edema. Respiratory: Clear to auscultation without wheezing, rhonchi or crackles. No increased work of breathing. Abdominal:  Non-distended, non-tender & soft. No organomegaly or masses appreciated. Normal bowel sounds heard. CNS: Alert and oriented. No focal deficits. Extremities: no edema, no cyanosis   The results of significant diagnostics from this hospitalization (including imaging, microbiology, ancillary and laboratory) are listed below for reference.      Microbiology:   Recent Results (from the past 240 hour(s))  Urine Culture     Status: None   Collection Time: 08/01/20  9:30 AM   Specimen: Urine  Result Value Ref Range Status   MICRO NUMBER:  [id number]  Final   SPECIMEN QUALITY: Adequate  Final   Sample Source URINE  Final   STATUS: FINAL  Final   Result: No Growth  Final  Respiratory Panel by RT PCR (Flu A&B, Covid) - Nasopharyngeal Swab     Status: None   Collection Time: 08/02/20 12:07 AM   Specimen: Nasopharyngeal Swab  Result Value Ref Range Status   SARS Coronavirus 2 by RT PCR NEGATIVE NEGATIVE Final    Comment: (NOTE) SARS-CoV-2 target nucleic acids are NOT DETECTED.  The SARS-CoV-2 RNA is generally detectable in upper respiratoy specimens during the acute phase of infection. The lowest concentration of SARS-CoV-2 viral copies this assay can detect is 131 copies/mL. A negative result does not preclude SARS-Cov-2 infection and should not be used as the sole basis for treatment or other patient management decisions. A negative result may occur with  improper specimen collection/handling, submission of specimen  other than nasopharyngeal swab, presence of viral mutation(s) within the areas targeted by this assay, and inadequate number of viral copies (<131 copies/mL). A negative result must be combined with clinical observations, patient history, and epidemiological information. The expected result is Negative.  Fact Sheet for Patients:  PinkCheek.be  Fact Sheet for Healthcare Providers:  GravelBags.it  This test is no t yet approved or cleared by the [place] FDA and  has been authorized for detection and/or diagnosis of SARS-CoV-2 by FDA under an Emergency Use Authorization (EUA). This EUA will remain  in effect (meaning this test can be used) for the duration of the COVID-19 declaration under Section 564(b)(1) of the Act, 21 U.S.C. section 360bbb-3(b)(1), unless the authorization is terminated or revoked sooner.     Influenza A by PCR NEGATIVE NEGATIVE Final   Influenza B by PCR NEGATIVE NEGATIVE Final    Comment: (NOTE) The Xpert Xpress SARS-CoV-2/FLU/RSV assay is intended as an aid in  the diagnosis of influenza from Nasopharyngeal swab specimens and  should not be used as a sole basis for treatment. Nasal washings and  aspirates are unacceptable for Xpert Xpress SARS-CoV-2/FLU/RSV  testing.  Fact Sheet for Patients: PinkCheek.be  Fact Sheet for Healthcare Providers: GravelBags.it  This test is not yet approved or cleared by the [place] FDA and  has been authorized for detection and/or diagnosis of SARS-CoV-2 by  FDA under an Emergency Use Authorization (EUA). This EUA will remain  in effect (meaning this test can be used) for the duration of the  Covid-19 declaration under Section 564(b)(1) of the Act, 21  U.S.C. section 360bbb-3(b)(1), unless the authorization is  terminated or revoked. Performed at [facility], [address]  [place], [place] [address]   Culture, blood (routine x 2)     Status: None (Preliminary result)   Collection Time: 08/02/20  1:45 AM   Specimen: BLOOD  Result Value Ref Range Status   Specimen Description BLOOD BLOOD RIGHT FOREARM  Final   Special Requests   Final    BOTTLES DRAWN AEROBIC AND ANAEROBIC Blood Culture adequate volume   Culture   Final    NO GROWTH 4 DAYS Performed at [facility], [address] [place], [place] [address]    Report Status PENDING  Incomplete  Culture, blood (routine x 2)     Status: None (Preliminary result)   Collection Time: 08/02/20  1:45 AM   Specimen: BLOOD  Result Value Ref Range Status   Specimen Description BLOOD BLOOD RIGHT FOREARM  Final   Special Requests  Final    BOTTLES DRAWN AEROBIC AND ANAEROBIC Blood Culture adequate volume   Culture   Final    NO GROWTH 4 DAYS Performed at [facility], [address] [place], [place] [address]    Report Status PENDING  Incomplete  Urine Culture     Status: None   Collection Time: 08/02/20  4:15 AM   Specimen: Urine, Catheterized  Result Value Ref Range Status   Specimen Description   Final    URINE, CATHETERIZED Performed at [facility], [address], [place], [place] [address]    Special Requests   Final    NONE Performed at [facility], [address] [place], [place] [address]    Culture   Final    NO GROWTH Performed at [facility], [place] [address], [place],  [address]    Report Status 08/03/2020 FINAL  Final  Resp Panel by RT PCR (RSV, Flu A&B, Covid) - Nasopharyngeal Swab     Status: None   Collection Time: 08/05/20  5:12 PM   Specimen: Nasopharyngeal Swab  Result Value Ref Range Status   SARS Coronavirus 2 by RT PCR NEGATIVE NEGATIVE Final    Comment: (NOTE) SARS-CoV-2 target nucleic acids are NOT DETECTED.  The SARS-CoV-2 RNA is generally detectable in upper respiratoy specimens during the acute phase of infection. The  lowest concentration of SARS-CoV-2 viral copies this assay can detect is 131 copies/mL. A negative result does not preclude SARS-Cov-2 infection and should not be used as the sole basis for treatment or other patient management decisions. A negative result may occur with  improper specimen collection/handling, submission of specimen other than nasopharyngeal swab, presence of viral mutation(s) within the areas targeted by this assay, and inadequate number of viral copies (<131 copies/mL). A negative result must be combined with clinical observations, patient history, and epidemiological information. The expected result is Negative.  Fact Sheet for Patients:  PinkCheek.be  Fact Sheet for Healthcare Providers:  GravelBags.it  This test is no t yet approved or cleared by the [place] FDA and  has been authorized for detection and/or diagnosis of SARS-CoV-2 by FDA under an Emergency Use Authorization (EUA). This EUA will remain  in effect (meaning this test can be used) for the duration of the COVID-19 declaration under Section 564(b)(1) of the Act, 21 U.S.C. section 360bbb-3(b)(1), unless the authorization is terminated or revoked sooner.     Influenza A by PCR NEGATIVE NEGATIVE Final   Influenza B by PCR NEGATIVE NEGATIVE Final    Comment: (NOTE) The Xpert Xpress SARS-CoV-2/FLU/RSV assay is intended as an aid in  the diagnosis of influenza from Nasopharyngeal swab specimens and  should not be used as a sole basis for treatment. Nasal washings and  aspirates are unacceptable for Xpert Xpress SARS-CoV-2/FLU/RSV  testing.  Fact Sheet for Patients: PinkCheek.be  Fact Sheet for Healthcare Providers: GravelBags.it  This test is not yet approved or cleared by the [place] FDA and  has been authorized for detection and/or diagnosis of SARS-CoV-2 by  FDA under  an Emergency Use Authorization (EUA). This EUA will remain  in effect (meaning this test can be used) for the duration of the  Covid-19 declaration under Section 564(b)(1) of the Act, 21  U.S.C. section 360bbb-3(b)(1), unless the authorization is  terminated or revoked.    Respiratory Syncytial Virus by PCR NEGATIVE NEGATIVE Final    Comment: (NOTE) Fact Sheet for Patients: PinkCheek.be  Fact Sheet for Healthcare Providers:  GravelBags.it  This test is not yet approved or cleared by the [place] and  has been authorized for detection and/or diagnosis of SARS-CoV-2 by  FDA under an Emergency Use Authorization (EUA). This EUA will remain  in effect (meaning this test can be used) for the duration of the  COVID-19 declaration under Section 564(b)(1) of the Act, 21 U.S.C.  section 360bbb-3(b)(1), unless the authorization is terminated or  revoked. Performed at [facility], [address] [place], [place] [address]      Labs:   CBC: Recent Labs  Lab 08/01/20 1542 08/02/20 0502 08/03/20 1733  WBC 7.6 9.3 6.6  NEUTROABS 5.7  --  4.9  HGB 15.0 13.4 12.8  HCT 45.0 37.8 36.7  MCV 98.3 95.9 93.9  PLT 125* 127* 90*   Basic Metabolic Panel: Recent Labs  Lab 08/01/20 1542 08/01/20 1542 08/02/20 0502 08/03/20 0430 08/04/20 0442 08/05/20 0520 08/06/20 0517  NA 142  --   --  139 142 139 140  K 4.1  --   --  4.0 3.3* 3.6 3.2*  CL 99  --   --  107 102 103 105  CO2 29  --   --  $R'22 30 23 24  'pd$ GLUCOSE 106*  --   --  84 86 72 97  BUN 21  --   --  $R'15 12 12 12  'Ew$ CREATININE 0.87   < > 0.79 0.70 0.59 0.58 0.60  CALCIUM 9.6  --   --  8.2* 8.3* 8.0* 8.2*   < > = values in this interval not displayed.   Liver Function Tests: Recent Labs  Lab 08/01/20 1542  AST 21  ALT 13  ALKPHOS 67  BILITOT 1.1  PROT 7.1  ALBUMIN 4.3   BNP (last 3 results) No results for input(s): BNP in the last 8760  hours. Cardiac Enzymes: No results for input(s): CKTOTAL, CKMB, CKMBINDEX, TROPONINI in the last 168 hours. CBG: No results for input(s): GLUCAP in the last 168 hours. Hgb A1c No results for input(s): HGBA1C in the last 72 hours. Lipid Profile No results for input(s): CHOL, HDL, LDLCALC, TRIG, CHOLHDL, LDLDIRECT in the last 72 hours. Thyroid function studies No results for input(s): TSH, T4TOTAL, T3FREE, THYROIDAB in the last 72 hours.  Invalid input(s): FREET3 Anemia work up No results for input(s): VITAMINB12, FOLATE, FERRITIN, TIBC, IRON, RETICCTPCT in the last 72 hours. Urinalysis    Component Value Date/Time   COLORURINE YELLOW (A) 08/02/2020 0145   APPEARANCEUR CLEAR (A) 08/02/2020 0145   LABSPEC 1.017 08/02/2020 0145   PHURINE 6.0 08/02/2020 0145   GLUCOSEU NEGATIVE 08/02/2020 0145   HGBUR NEGATIVE 08/02/2020 0145   HGBUR trace-lysed 12/12/2010 1509   BILIRUBINUR NEGATIVE 08/02/2020 0145   BILIRUBINUR Negative 08/01/2020 0922   KETONESUR 5 (A) 08/02/2020 0145   PROTEINUR NEGATIVE 08/02/2020 0145   UROBILINOGEN 0.2 08/01/2020 0922   UROBILINOGEN 0.2 12/12/2010 1509   NITRITE NEGATIVE 08/02/2020 0145   LEUKOCYTESUR NEGATIVE 08/02/2020 0145         Time coordinating discharge: Over 70 minutes  SIGNED: Deatra James, MD, FACP, FHM. [facility],  Please use [email or web address] to Page If 7PM-7AM, please contact night-coverage [email or web address], Password [facility] 08/06/2020, 10:04 AM

## 2020-08-06 NOTE — Progress Notes (Signed)
Report given to RN at [facility] and rehab, pt iv dc'd tolerated well. Will continue to monitor until EMS arrives.

## 2020-08-07 DIAGNOSIS — M109 Gout, unspecified: Secondary | ICD-10-CM | POA: Diagnosis not present

## 2020-08-07 DIAGNOSIS — G9341 Metabolic encephalopathy: Secondary | ICD-10-CM | POA: Diagnosis not present

## 2020-08-07 DIAGNOSIS — M810 Age-related osteoporosis without current pathological fracture: Secondary | ICD-10-CM | POA: Diagnosis not present

## 2020-08-07 DIAGNOSIS — F329 Major depressive disorder, single episode, unspecified: Secondary | ICD-10-CM | POA: Diagnosis not present

## 2020-08-07 DIAGNOSIS — E782 Mixed hyperlipidemia: Secondary | ICD-10-CM | POA: Diagnosis not present

## 2020-08-07 DIAGNOSIS — I10 Essential (primary) hypertension: Secondary | ICD-10-CM | POA: Diagnosis not present

## 2020-08-07 DIAGNOSIS — K219 Gastro-esophageal reflux disease without esophagitis: Secondary | ICD-10-CM | POA: Diagnosis not present

## 2020-08-07 DIAGNOSIS — R2681 Unsteadiness on feet: Secondary | ICD-10-CM | POA: Diagnosis not present

## 2020-08-07 DIAGNOSIS — M1990 Unspecified osteoarthritis, unspecified site: Secondary | ICD-10-CM | POA: Diagnosis not present

## 2020-08-07 DIAGNOSIS — F0391 Unspecified dementia with behavioral disturbance: Secondary | ICD-10-CM | POA: Diagnosis not present

## 2020-08-07 DIAGNOSIS — M6281 Muscle weakness (generalized): Secondary | ICD-10-CM | POA: Diagnosis not present

## 2020-08-07 LAB — CULTURE, BLOOD (ROUTINE X 2)
Culture: NO GROWTH
Culture: NO GROWTH
Special Requests: ADEQUATE
Special Requests: ADEQUATE

## 2020-08-09 ENCOUNTER — Other Ambulatory Visit: Payer: Self-pay | Admitting: *Deleted

## 2020-08-09 DIAGNOSIS — I10 Essential (primary) hypertension: Secondary | ICD-10-CM | POA: Diagnosis not present

## 2020-08-09 DIAGNOSIS — F0391 Unspecified dementia with behavioral disturbance: Secondary | ICD-10-CM | POA: Diagnosis not present

## 2020-08-09 DIAGNOSIS — E782 Mixed hyperlipidemia: Secondary | ICD-10-CM | POA: Diagnosis not present

## 2020-08-09 DIAGNOSIS — G9341 Metabolic encephalopathy: Secondary | ICD-10-CM | POA: Diagnosis not present

## 2020-08-09 DIAGNOSIS — R2681 Unsteadiness on feet: Secondary | ICD-10-CM | POA: Diagnosis not present

## 2020-08-09 DIAGNOSIS — F329 Major depressive disorder, single episode, unspecified: Secondary | ICD-10-CM | POA: Diagnosis not present

## 2020-08-09 DIAGNOSIS — K219 Gastro-esophageal reflux disease without esophagitis: Secondary | ICD-10-CM | POA: Diagnosis not present

## 2020-08-09 DIAGNOSIS — M810 Age-related osteoporosis without current pathological fracture: Secondary | ICD-10-CM | POA: Diagnosis not present

## 2020-08-09 DIAGNOSIS — M109 Gout, unspecified: Secondary | ICD-10-CM | POA: Diagnosis not present

## 2020-08-09 DIAGNOSIS — M1990 Unspecified osteoarthritis, unspecified site: Secondary | ICD-10-CM | POA: Diagnosis not present

## 2020-08-09 DIAGNOSIS — M6281 Muscle weakness (generalized): Secondary | ICD-10-CM | POA: Diagnosis not present

## 2020-08-09 NOTE — Patient Outreach (Signed)
Member screened for potential [facility] Care Management needs as a benefit of [place] Medicare.  Per Patient Michelle Gregory member resides in [facility].   Communication with facility SW indicating member's family is considering LTC pending progress.  Will continue to follow while member resides in SNF. Member was active with [facility] Management prior to admission. Will update [facility] LCSW.   Marthenia Rolling, MSN-Ed, RN,BSN [place] Acute Care Coordinator [phone] [facility]) [phone]  (Toll free office)

## 2020-08-13 DIAGNOSIS — K219 Gastro-esophageal reflux disease without esophagitis: Secondary | ICD-10-CM | POA: Diagnosis not present

## 2020-08-13 DIAGNOSIS — F0391 Unspecified dementia with behavioral disturbance: Secondary | ICD-10-CM | POA: Diagnosis not present

## 2020-08-13 DIAGNOSIS — M6281 Muscle weakness (generalized): Secondary | ICD-10-CM | POA: Diagnosis not present

## 2020-08-13 DIAGNOSIS — F329 Major depressive disorder, single episode, unspecified: Secondary | ICD-10-CM | POA: Diagnosis not present

## 2020-08-13 DIAGNOSIS — R2681 Unsteadiness on feet: Secondary | ICD-10-CM | POA: Diagnosis not present

## 2020-08-13 DIAGNOSIS — G9341 Metabolic encephalopathy: Secondary | ICD-10-CM | POA: Diagnosis not present

## 2020-08-13 DIAGNOSIS — M1990 Unspecified osteoarthritis, unspecified site: Secondary | ICD-10-CM | POA: Diagnosis not present

## 2020-08-13 DIAGNOSIS — E782 Mixed hyperlipidemia: Secondary | ICD-10-CM | POA: Diagnosis not present

## 2020-08-13 DIAGNOSIS — I10 Essential (primary) hypertension: Secondary | ICD-10-CM | POA: Diagnosis not present

## 2020-08-13 DIAGNOSIS — M810 Age-related osteoporosis without current pathological fracture: Secondary | ICD-10-CM | POA: Diagnosis not present

## 2020-08-13 DIAGNOSIS — M109 Gout, unspecified: Secondary | ICD-10-CM | POA: Diagnosis not present

## 2020-08-15 DIAGNOSIS — M810 Age-related osteoporosis without current pathological fracture: Secondary | ICD-10-CM | POA: Diagnosis not present

## 2020-08-15 DIAGNOSIS — M6281 Muscle weakness (generalized): Secondary | ICD-10-CM | POA: Diagnosis not present

## 2020-08-15 DIAGNOSIS — I10 Essential (primary) hypertension: Secondary | ICD-10-CM | POA: Diagnosis not present

## 2020-08-15 DIAGNOSIS — G9341 Metabolic encephalopathy: Secondary | ICD-10-CM | POA: Diagnosis not present

## 2020-08-15 DIAGNOSIS — M109 Gout, unspecified: Secondary | ICD-10-CM | POA: Diagnosis not present

## 2020-08-15 DIAGNOSIS — M1990 Unspecified osteoarthritis, unspecified site: Secondary | ICD-10-CM | POA: Diagnosis not present

## 2020-08-15 DIAGNOSIS — R2681 Unsteadiness on feet: Secondary | ICD-10-CM | POA: Diagnosis not present

## 2020-08-15 DIAGNOSIS — F329 Major depressive disorder, single episode, unspecified: Secondary | ICD-10-CM | POA: Diagnosis not present

## 2020-08-15 DIAGNOSIS — E782 Mixed hyperlipidemia: Secondary | ICD-10-CM | POA: Diagnosis not present

## 2020-08-15 DIAGNOSIS — K219 Gastro-esophageal reflux disease without esophagitis: Secondary | ICD-10-CM | POA: Diagnosis not present

## 2020-08-15 DIAGNOSIS — F0391 Unspecified dementia with behavioral disturbance: Secondary | ICD-10-CM | POA: Diagnosis not present

## 2020-08-16 DIAGNOSIS — M6281 Muscle weakness (generalized): Secondary | ICD-10-CM | POA: Diagnosis not present

## 2020-08-16 DIAGNOSIS — F329 Major depressive disorder, single episode, unspecified: Secondary | ICD-10-CM | POA: Diagnosis not present

## 2020-08-16 DIAGNOSIS — G9341 Metabolic encephalopathy: Secondary | ICD-10-CM | POA: Diagnosis not present

## 2020-08-16 DIAGNOSIS — M1990 Unspecified osteoarthritis, unspecified site: Secondary | ICD-10-CM | POA: Diagnosis not present

## 2020-08-16 DIAGNOSIS — K219 Gastro-esophageal reflux disease without esophagitis: Secondary | ICD-10-CM | POA: Diagnosis not present

## 2020-08-16 DIAGNOSIS — I10 Essential (primary) hypertension: Secondary | ICD-10-CM | POA: Diagnosis not present

## 2020-08-16 DIAGNOSIS — M109 Gout, unspecified: Secondary | ICD-10-CM | POA: Diagnosis not present

## 2020-08-16 DIAGNOSIS — R2681 Unsteadiness on feet: Secondary | ICD-10-CM | POA: Diagnosis not present

## 2020-08-16 DIAGNOSIS — E782 Mixed hyperlipidemia: Secondary | ICD-10-CM | POA: Diagnosis not present

## 2020-08-16 DIAGNOSIS — F0391 Unspecified dementia with behavioral disturbance: Secondary | ICD-10-CM | POA: Diagnosis not present

## 2020-08-16 DIAGNOSIS — M810 Age-related osteoporosis without current pathological fracture: Secondary | ICD-10-CM | POA: Diagnosis not present

## 2020-08-19 ENCOUNTER — Other Ambulatory Visit: Payer: Self-pay | Admitting: *Deleted

## 2020-08-19 NOTE — Patient Outreach (Signed)
[facility]) Care Management  08/19/2020  Michelle Gregory May 06, 1923 [id number]  CSW spoke with pt's daughter today who reports her mother is at [facility]. Per daughter, she is weak and getting therapy.  She plans to meet with the staff later this week to discuss further plans.  Daughter does not feel her mother is at at point where she is appropriate for Assisted Living right now.   CSW will touch base again later in week for updates.   Eduard Clos, MSW, [place] Worker  [name] [phone]

## 2020-08-19 NOTE — Patient Outreach (Signed)
[facility] Post- Acute Care Coordinator follow up. Member screened for potential [facility] Care Management needs as a benefit of [place] Medicare.  Per Patient Pearletha Forge member resides in  [facility].   Communication sent to SNF SW to request update on transtion plans.   Will continue to follow while member resides in [facility].   Marthenia Rolling, MSN-Ed, RN,BSN [place] Acute Care Coordinator [phone] [facility]) [phone]  (Toll free office)

## 2020-08-20 ENCOUNTER — Other Ambulatory Visit: Payer: Self-pay | Admitting: *Deleted

## 2020-08-20 DIAGNOSIS — K219 Gastro-esophageal reflux disease without esophagitis: Secondary | ICD-10-CM | POA: Diagnosis not present

## 2020-08-20 DIAGNOSIS — M6281 Muscle weakness (generalized): Secondary | ICD-10-CM | POA: Diagnosis not present

## 2020-08-20 DIAGNOSIS — M1990 Unspecified osteoarthritis, unspecified site: Secondary | ICD-10-CM | POA: Diagnosis not present

## 2020-08-20 DIAGNOSIS — M810 Age-related osteoporosis without current pathological fracture: Secondary | ICD-10-CM | POA: Diagnosis not present

## 2020-08-20 DIAGNOSIS — R2681 Unsteadiness on feet: Secondary | ICD-10-CM | POA: Diagnosis not present

## 2020-08-20 DIAGNOSIS — F0391 Unspecified dementia with behavioral disturbance: Secondary | ICD-10-CM | POA: Diagnosis not present

## 2020-08-20 DIAGNOSIS — F329 Major depressive disorder, single episode, unspecified: Secondary | ICD-10-CM | POA: Diagnosis not present

## 2020-08-20 DIAGNOSIS — I10 Essential (primary) hypertension: Secondary | ICD-10-CM | POA: Diagnosis not present

## 2020-08-20 DIAGNOSIS — G9341 Metabolic encephalopathy: Secondary | ICD-10-CM | POA: Diagnosis not present

## 2020-08-20 DIAGNOSIS — E782 Mixed hyperlipidemia: Secondary | ICD-10-CM | POA: Diagnosis not present

## 2020-08-20 DIAGNOSIS — M109 Gout, unspecified: Secondary | ICD-10-CM | POA: Diagnosis not present

## 2020-08-20 NOTE — Patient Outreach (Signed)
[facility] Post- Acute Care Coordinator follow up. Member screened for potential [facility] Care Management needs as a benefit of [place] Medicare.  Update received from [place] indicating transition plans are pending. Facility has scheduled family this week to discuss further.   Will continue to follow while member resides in SNF.    Marthenia Rolling, MSN-Ed, RN,BSN [place] Acute Care Coordinator [phone] [facility]) [phone]  (Toll free office)

## 2020-08-22 DIAGNOSIS — G9341 Metabolic encephalopathy: Secondary | ICD-10-CM | POA: Diagnosis not present

## 2020-08-22 DIAGNOSIS — M109 Gout, unspecified: Secondary | ICD-10-CM | POA: Diagnosis not present

## 2020-08-22 DIAGNOSIS — M810 Age-related osteoporosis without current pathological fracture: Secondary | ICD-10-CM | POA: Diagnosis not present

## 2020-08-22 DIAGNOSIS — K219 Gastro-esophageal reflux disease without esophagitis: Secondary | ICD-10-CM | POA: Diagnosis not present

## 2020-08-22 DIAGNOSIS — M1990 Unspecified osteoarthritis, unspecified site: Secondary | ICD-10-CM | POA: Diagnosis not present

## 2020-08-22 DIAGNOSIS — M6281 Muscle weakness (generalized): Secondary | ICD-10-CM | POA: Diagnosis not present

## 2020-08-22 DIAGNOSIS — F0391 Unspecified dementia with behavioral disturbance: Secondary | ICD-10-CM | POA: Diagnosis not present

## 2020-08-22 DIAGNOSIS — I10 Essential (primary) hypertension: Secondary | ICD-10-CM | POA: Diagnosis not present

## 2020-08-22 DIAGNOSIS — E782 Mixed hyperlipidemia: Secondary | ICD-10-CM | POA: Diagnosis not present

## 2020-08-22 DIAGNOSIS — F329 Major depressive disorder, single episode, unspecified: Secondary | ICD-10-CM | POA: Diagnosis not present

## 2020-08-22 DIAGNOSIS — R2681 Unsteadiness on feet: Secondary | ICD-10-CM | POA: Diagnosis not present

## 2020-08-23 ENCOUNTER — Ambulatory Visit: Payer: Self-pay | Admitting: *Deleted

## 2020-08-26 DIAGNOSIS — K219 Gastro-esophageal reflux disease without esophagitis: Secondary | ICD-10-CM | POA: Diagnosis not present

## 2020-08-26 DIAGNOSIS — M6281 Muscle weakness (generalized): Secondary | ICD-10-CM | POA: Diagnosis not present

## 2020-08-26 DIAGNOSIS — G9341 Metabolic encephalopathy: Secondary | ICD-10-CM | POA: Diagnosis not present

## 2020-08-26 DIAGNOSIS — M109 Gout, unspecified: Secondary | ICD-10-CM | POA: Diagnosis not present

## 2020-08-26 DIAGNOSIS — I10 Essential (primary) hypertension: Secondary | ICD-10-CM | POA: Diagnosis not present

## 2020-08-26 DIAGNOSIS — R2681 Unsteadiness on feet: Secondary | ICD-10-CM | POA: Diagnosis not present

## 2020-08-26 DIAGNOSIS — E782 Mixed hyperlipidemia: Secondary | ICD-10-CM | POA: Diagnosis not present

## 2020-08-26 DIAGNOSIS — F0391 Unspecified dementia with behavioral disturbance: Secondary | ICD-10-CM | POA: Diagnosis not present

## 2020-08-26 DIAGNOSIS — M1990 Unspecified osteoarthritis, unspecified site: Secondary | ICD-10-CM | POA: Diagnosis not present

## 2020-08-26 DIAGNOSIS — F329 Major depressive disorder, single episode, unspecified: Secondary | ICD-10-CM | POA: Diagnosis not present

## 2020-08-26 DIAGNOSIS — M810 Age-related osteoporosis without current pathological fracture: Secondary | ICD-10-CM | POA: Diagnosis not present

## 2020-08-28 ENCOUNTER — Other Ambulatory Visit: Payer: Self-pay | Admitting: *Deleted

## 2020-08-28 NOTE — Patient Outreach (Signed)
[facility] Post- Acute Care Coordinator follow up. Member screened for potential [facility] Care Management needs as a benefit of [place] Medicare.  Verified in Patient Pearletha For[place] that member resides in [facility].   Communication sent to SNF SW to request update about transition plans and potential [facility] Care Management needs.   Will continue to follow while member resides in SNF.    Marthenia Rolling, MSN-Ed, RN,BSN [place] Acute Care Coordinator [phone] [facility]) [phone]  (Toll free office)

## 2020-08-29 ENCOUNTER — Other Ambulatory Visit: Payer: Self-pay | Admitting: *Deleted

## 2020-08-29 NOTE — Patient Outreach (Signed)
[place] [facility]) Care Management  08/29/2020  Michelle Gregory 1923/09/13 [id number]   CSW spoke with pt's daughter, [name], who reports pt is still at [facility] rehab.  "She is worse and they want me to move her to memory care".   Daughter is concerned that they can't afford ALF/memory care based on her search thus far.  Daughter plans to meet with an Elder Training and development officer in the next 2 weeks, as well as meet with SNF team to discuss dc plans further.  CSW plans to provide other ALF's to consider and follow up again in the next 10 days.   Eduard Clos, MSW, [place] Worker  [place] [phone]

## 2020-08-30 ENCOUNTER — Other Ambulatory Visit: Payer: Self-pay | Admitting: *Deleted

## 2020-08-30 DIAGNOSIS — F0390 Unspecified dementia without behavioral disturbance: Secondary | ICD-10-CM | POA: Diagnosis not present

## 2020-08-30 DIAGNOSIS — F413 Other mixed anxiety disorders: Secondary | ICD-10-CM | POA: Diagnosis not present

## 2020-08-30 DIAGNOSIS — F32A Depression, unspecified: Secondary | ICD-10-CM | POA: Diagnosis not present

## 2020-09-03 ENCOUNTER — Telehealth: Payer: Self-pay | Admitting: Family Medicine

## 2020-09-03 DIAGNOSIS — Z85038 Personal history of other malignant neoplasm of large intestine: Secondary | ICD-10-CM

## 2020-09-03 DIAGNOSIS — F418 Other specified anxiety disorders: Secondary | ICD-10-CM

## 2020-09-03 DIAGNOSIS — M109 Gout, unspecified: Secondary | ICD-10-CM | POA: Diagnosis not present

## 2020-09-03 DIAGNOSIS — K219 Gastro-esophageal reflux disease without esophagitis: Secondary | ICD-10-CM | POA: Diagnosis not present

## 2020-09-03 DIAGNOSIS — M5136 Other intervertebral disc degeneration, lumbar region: Secondary | ICD-10-CM

## 2020-09-03 DIAGNOSIS — I10 Essential (primary) hypertension: Secondary | ICD-10-CM | POA: Diagnosis not present

## 2020-09-03 DIAGNOSIS — M810 Age-related osteoporosis without current pathological fracture: Secondary | ICD-10-CM | POA: Diagnosis not present

## 2020-09-03 DIAGNOSIS — E782 Mixed hyperlipidemia: Secondary | ICD-10-CM | POA: Diagnosis not present

## 2020-09-03 DIAGNOSIS — G9341 Metabolic encephalopathy: Secondary | ICD-10-CM | POA: Diagnosis not present

## 2020-09-03 DIAGNOSIS — F0391 Unspecified dementia with behavioral disturbance: Secondary | ICD-10-CM

## 2020-09-03 DIAGNOSIS — R2681 Unsteadiness on feet: Secondary | ICD-10-CM | POA: Diagnosis not present

## 2020-09-03 DIAGNOSIS — F329 Major depressive disorder, single episode, unspecified: Secondary | ICD-10-CM | POA: Diagnosis not present

## 2020-09-03 DIAGNOSIS — M6281 Muscle weakness (generalized): Secondary | ICD-10-CM | POA: Diagnosis not present

## 2020-09-03 DIAGNOSIS — M1990 Unspecified osteoarthritis, unspecified site: Secondary | ICD-10-CM | POA: Diagnosis not present

## 2020-09-03 NOTE — Telephone Encounter (Signed)
Please fax. Thanks

## 2020-09-03 NOTE — Telephone Encounter (Signed)
Pt daugther called wanted to speak to Dr. Glori Bickers due to they are thinking about bringing ms. Michelle Gregory home instead of a memory care unit and wanted to Dr. Glori Bickers opinion and needed help with medicine for comfrontablity and sleep , and they are thinking about bringing her home next week.   Best contact number: [phone]

## 2020-09-03 NOTE — Telephone Encounter (Signed)
Spoke to dtr [name]) - She stated that pt dementia has gotten worse.  [name] stated that patient has been taking the following:   Seroquel 25mg  - 2tabs qam and 1 tab qpm Mirtazapine 15mg  - 1 at bedtime memantine 5 mg - bid  [name] stated that she was direct admitted to rehab facility and is not sure if they [facility]) have been giving the risperidone as directed by the hospital provider.   [name] has a meeting with the provider at [facility] and will confirm the medications that pt has be taking.   [name] stated that she would call me back at my direct number [phone] and let me.   [name] and I also spoke about the benefits of Hospice referral. [name] agreed and PCP also agreed.   Hospice referral has been entered and I have contacted them. They have requested a faxed referral and demographics of patient. Sending message to [facility] to help with this as I do not have access to fax the request..

## 2020-09-03 NOTE — Telephone Encounter (Signed)
Thanks for the heads up.   I see she is on risperidone now (prev seroquel).  How is her agitation?    What is her sleep pattern lately.  If she tolerates it well, I would increase her PM dose for sleep

## 2020-09-04 NOTE — Telephone Encounter (Signed)
Please tell them to go up to 1 mg of respirdal for the evening dose and watch carefully for sedation/etc  Let me know how it goes Do I need to send a px anywhere?    Thanks

## 2020-09-04 NOTE — Telephone Encounter (Signed)
Please clarify respirdal dose - I think it is .05 mg bid instead of 5 mg, correct?  My next move to help with sleep is to increase the pm dose of that from 0.5 to 1   (we can gradually increase it to help sleep)  The hospice team may also have some ideas   Thanks

## 2020-09-04 NOTE — Telephone Encounter (Signed)
I spoke to [place] and she confirmed that the dose is the 0.5 mg bid.   My apologizes for the oversight.

## 2020-09-04 NOTE — Telephone Encounter (Signed)
Cc [place]

## 2020-09-04 NOTE — Telephone Encounter (Addendum)
Spoke to [employer] - risperdol 5 mg at 9 am and at 9 pm, memantine 5mg  bid and the mirtazapine 15 mg at 9 pm    [name] confirmed that patient is not taking the Seroquel.   Patients mood has been "okay" and she is not as agitated however, patient is not sleeping all night. [name] stated that patient is waking up about 4 am and wanders the hall.   [name] stated that Hospice has already contacted her and they will reach back out on Thursday to check the location of the patient and then schedule a time to come out asap.   Please advise if there is any medication adjustment needed/recommended.

## 2020-09-05 DIAGNOSIS — I10 Essential (primary) hypertension: Secondary | ICD-10-CM | POA: Diagnosis not present

## 2020-09-05 DIAGNOSIS — M810 Age-related osteoporosis without current pathological fracture: Secondary | ICD-10-CM | POA: Diagnosis not present

## 2020-09-05 DIAGNOSIS — E782 Mixed hyperlipidemia: Secondary | ICD-10-CM | POA: Diagnosis not present

## 2020-09-05 DIAGNOSIS — F329 Major depressive disorder, single episode, unspecified: Secondary | ICD-10-CM | POA: Diagnosis not present

## 2020-09-05 DIAGNOSIS — G9341 Metabolic encephalopathy: Secondary | ICD-10-CM | POA: Diagnosis not present

## 2020-09-05 DIAGNOSIS — F0391 Unspecified dementia with behavioral disturbance: Secondary | ICD-10-CM | POA: Diagnosis not present

## 2020-09-05 DIAGNOSIS — M6281 Muscle weakness (generalized): Secondary | ICD-10-CM | POA: Diagnosis not present

## 2020-09-05 DIAGNOSIS — M109 Gout, unspecified: Secondary | ICD-10-CM | POA: Diagnosis not present

## 2020-09-05 DIAGNOSIS — K219 Gastro-esophageal reflux disease without esophagitis: Secondary | ICD-10-CM | POA: Diagnosis not present

## 2020-09-05 DIAGNOSIS — M1990 Unspecified osteoarthritis, unspecified site: Secondary | ICD-10-CM | POA: Diagnosis not present

## 2020-09-05 DIAGNOSIS — R2681 Unsteadiness on feet: Secondary | ICD-10-CM | POA: Diagnosis not present

## 2020-09-05 MED ORDER — RISPERIDONE 1 MG/ML PO SOLN: ORAL | 1 refills | Status: AC

## 2020-09-05 NOTE — Telephone Encounter (Signed)
Michelle Gregory did request a new prescription so she could pick up at her earliest convenience. Pharmacy on file is the correct pharmacy.

## 2020-09-06 ENCOUNTER — Ambulatory Visit: Payer: Self-pay | Admitting: *Deleted

## 2020-09-06 NOTE — Telephone Encounter (Signed)
Contacted [name] and informed that rx for risperidone (0.5mg  in the am and 1 mg in the pm) has been sent to [facility]. Also informed to let Dr. Glori Bickers know if patient shows any signs of over sedation once the new dose is started.

## 2020-09-09 ENCOUNTER — Other Ambulatory Visit: Payer: Self-pay | Admitting: *Deleted

## 2020-09-09 DIAGNOSIS — F0390 Unspecified dementia without behavioral disturbance: Secondary | ICD-10-CM | POA: Diagnosis not present

## 2020-09-09 DIAGNOSIS — F413 Other mixed anxiety disorders: Secondary | ICD-10-CM | POA: Diagnosis not present

## 2020-09-09 DIAGNOSIS — F32A Depression, unspecified: Secondary | ICD-10-CM | POA: Diagnosis not present

## 2020-09-09 NOTE — Patient Outreach (Signed)
[place] [facility]) Care Management  09/09/2020  Michelle Gregory [birth year] [id number]   CSW spoke with pt's daughter today by phone who reports they plan to meet with [facility] staff tomorrow to discuss pt's progress and plans.  Per daughter, they are considering options of home, ALF and/or memory care unit.  At this time, the daughter is researching area memory care facility's and plans to tour.   Pt's daughter also shared that they comparing costs of facilities as her monies are limited and likely will need Medicaid down the road.   Daughter also shared plans to meet with Hospice for assessment of appropriateness versus Palliative Care.  Pt's daughter is familiar with the positive support and assistance they can offer.   CSW encouraged daughter to call with any questions, needs or problems that arise between now and CSW's follow up call next week.  Daughter appreciative of assistance and support.   Eduard Clos, MSW, [place] Worker  [place] [phone]

## 2020-09-10 DIAGNOSIS — I10 Essential (primary) hypertension: Secondary | ICD-10-CM | POA: Diagnosis not present

## 2020-09-10 DIAGNOSIS — G9341 Metabolic encephalopathy: Secondary | ICD-10-CM | POA: Diagnosis not present

## 2020-09-10 DIAGNOSIS — F329 Major depressive disorder, single episode, unspecified: Secondary | ICD-10-CM | POA: Diagnosis not present

## 2020-09-10 DIAGNOSIS — M109 Gout, unspecified: Secondary | ICD-10-CM | POA: Diagnosis not present

## 2020-09-10 DIAGNOSIS — M810 Age-related osteoporosis without current pathological fracture: Secondary | ICD-10-CM | POA: Diagnosis not present

## 2020-09-10 DIAGNOSIS — F0391 Unspecified dementia with behavioral disturbance: Secondary | ICD-10-CM | POA: Diagnosis not present

## 2020-09-10 DIAGNOSIS — E782 Mixed hyperlipidemia: Secondary | ICD-10-CM | POA: Diagnosis not present

## 2020-09-10 DIAGNOSIS — M6281 Muscle weakness (generalized): Secondary | ICD-10-CM | POA: Diagnosis not present

## 2020-09-10 DIAGNOSIS — M1990 Unspecified osteoarthritis, unspecified site: Secondary | ICD-10-CM | POA: Diagnosis not present

## 2020-09-10 DIAGNOSIS — K219 Gastro-esophageal reflux disease without esophagitis: Secondary | ICD-10-CM | POA: Diagnosis not present

## 2020-09-10 DIAGNOSIS — R2681 Unsteadiness on feet: Secondary | ICD-10-CM | POA: Diagnosis not present

## 2020-09-11 ENCOUNTER — Other Ambulatory Visit: Payer: Self-pay | Admitting: *Deleted

## 2020-09-12 ENCOUNTER — Other Ambulatory Visit: Payer: Self-pay | Admitting: *Deleted

## 2020-09-12 DIAGNOSIS — I10 Essential (primary) hypertension: Secondary | ICD-10-CM | POA: Diagnosis not present

## 2020-09-12 DIAGNOSIS — R2681 Unsteadiness on feet: Secondary | ICD-10-CM | POA: Diagnosis not present

## 2020-09-12 DIAGNOSIS — E782 Mixed hyperlipidemia: Secondary | ICD-10-CM | POA: Diagnosis not present

## 2020-09-12 DIAGNOSIS — M810 Age-related osteoporosis without current pathological fracture: Secondary | ICD-10-CM | POA: Diagnosis not present

## 2020-09-12 DIAGNOSIS — F329 Major depressive disorder, single episode, unspecified: Secondary | ICD-10-CM | POA: Diagnosis not present

## 2020-09-12 DIAGNOSIS — M1990 Unspecified osteoarthritis, unspecified site: Secondary | ICD-10-CM | POA: Diagnosis not present

## 2020-09-12 DIAGNOSIS — M109 Gout, unspecified: Secondary | ICD-10-CM | POA: Diagnosis not present

## 2020-09-12 DIAGNOSIS — M6281 Muscle weakness (generalized): Secondary | ICD-10-CM | POA: Diagnosis not present

## 2020-09-12 DIAGNOSIS — G9341 Metabolic encephalopathy: Secondary | ICD-10-CM | POA: Diagnosis not present

## 2020-09-12 DIAGNOSIS — K219 Gastro-esophageal reflux disease without esophagitis: Secondary | ICD-10-CM | POA: Diagnosis not present

## 2020-09-12 DIAGNOSIS — F0391 Unspecified dementia with behavioral disturbance: Secondary | ICD-10-CM | POA: Diagnosis not present

## 2020-09-12 NOTE — Patient Outreach (Signed)
[facility] Post- Acute Care Coordinator follow up. Member screened for potential [facility] Care Management needs as a benefit of [place] Medicare.  Verified in Patient Michelle Gregory that member resides in [facility].   Communication sent to SNF SW to inquire about transition plans.  Will continue to follow while member resides in SNF.   Marthenia Rolling, MSN-Ed, RN,BSN [place] Acute Care Coordinator [phone] [facility])

## 2020-09-13 ENCOUNTER — Other Ambulatory Visit: Payer: Self-pay | Admitting: *Deleted

## 2020-09-13 NOTE — Patient Outreach (Signed)
[place] Coordinator follow up. Member screened for potential [facility] Care Management needs as a benefit of [place] Medicare.  Update received from [facility] SW indicating member's family is looking into [facility] ALF for transition.  Will continue to follow while member resides in SNF.   Marthenia Rolling, MSN-Ed, RN,BSN [place] Acute Care Coordinator [phone] [facility]) [phone]  (Toll free office)

## 2020-09-16 ENCOUNTER — Ambulatory Visit: Payer: Self-pay | Admitting: *Deleted

## 2020-09-19 ENCOUNTER — Other Ambulatory Visit: Payer: Self-pay | Admitting: *Deleted

## 2020-09-19 DIAGNOSIS — M109 Gout, unspecified: Secondary | ICD-10-CM | POA: Diagnosis not present

## 2020-09-19 DIAGNOSIS — M6281 Muscle weakness (generalized): Secondary | ICD-10-CM | POA: Diagnosis not present

## 2020-09-19 DIAGNOSIS — M1990 Unspecified osteoarthritis, unspecified site: Secondary | ICD-10-CM | POA: Diagnosis not present

## 2020-09-19 DIAGNOSIS — I10 Essential (primary) hypertension: Secondary | ICD-10-CM | POA: Diagnosis not present

## 2020-09-19 DIAGNOSIS — K219 Gastro-esophageal reflux disease without esophagitis: Secondary | ICD-10-CM | POA: Diagnosis not present

## 2020-09-19 DIAGNOSIS — F0391 Unspecified dementia with behavioral disturbance: Secondary | ICD-10-CM | POA: Diagnosis not present

## 2020-09-19 DIAGNOSIS — E782 Mixed hyperlipidemia: Secondary | ICD-10-CM | POA: Diagnosis not present

## 2020-09-19 DIAGNOSIS — F329 Major depressive disorder, single episode, unspecified: Secondary | ICD-10-CM | POA: Diagnosis not present

## 2020-09-19 DIAGNOSIS — G9341 Metabolic encephalopathy: Secondary | ICD-10-CM | POA: Diagnosis not present

## 2020-09-19 DIAGNOSIS — R2681 Unsteadiness on feet: Secondary | ICD-10-CM | POA: Diagnosis not present

## 2020-09-19 DIAGNOSIS — M810 Age-related osteoporosis without current pathological fracture: Secondary | ICD-10-CM | POA: Diagnosis not present

## 2020-09-19 NOTE — Patient Outreach (Signed)
[place] [facility]) Care Management  09/19/2020  Michelle Gregory 1923/04/02 [id number]   CSW spoke with pt's daughter by phone today who reports pt remains at [facility].  Per daughter, they have made the decision to move her into [facility]/Memory Care.  Per daughter, this was an emotional decision and yet feels it is the best decision.  CSW validated her feelings and reassured her they can always change their mind, move her out and back to the home or seek another setting.   CSW offered suggestions to help with the transition; both for pt and family.  CSW reminded daughter to call CSW if needs arise prior to follow up call in 2 weeks.   Daughter appreciative of CSW contacts and support. Reassurance and support provided.   Eduard Clos, MSW, [place] Worker  [place] [phone]

## 2020-09-25 ENCOUNTER — Telehealth: Payer: Self-pay | Admitting: Family Medicine

## 2020-09-25 DIAGNOSIS — M6281 Muscle weakness (generalized): Secondary | ICD-10-CM | POA: Diagnosis not present

## 2020-09-25 DIAGNOSIS — F0391 Unspecified dementia with behavioral disturbance: Secondary | ICD-10-CM | POA: Diagnosis not present

## 2020-09-25 DIAGNOSIS — F419 Anxiety disorder, unspecified: Secondary | ICD-10-CM | POA: Diagnosis not present

## 2020-09-25 DIAGNOSIS — Z9181 History of falling: Secondary | ICD-10-CM | POA: Diagnosis not present

## 2020-09-25 NOTE — Telephone Encounter (Signed)
Was at [facility] place  Then went to [facility] place - memory care  Had a rough start but overall likes the place and they are good to her  Michelle Gregory is having a hard time personally and misses her (aware that caring for her at home would be very difficult and could not ensure her safety)  Plans to give it some more time and she will keep me updated

## 2020-09-25 NOTE — Telephone Encounter (Signed)
Patient's daughter states that she would like to talk to you without her mom being present.Mom is in memory care unit. She is not satisfied with it. Would like to Bring her home. Please call [name] at [phone]. EM

## 2020-09-25 NOTE — Telephone Encounter (Signed)
No answer- LM on voicemail

## 2020-09-30 ENCOUNTER — Other Ambulatory Visit: Payer: Self-pay | Admitting: *Deleted

## 2020-09-30 ENCOUNTER — Ambulatory Visit: Payer: MEDICARE | Admitting: Family Medicine

## 2020-10-01 NOTE — Patient Outreach (Signed)
[place] [facility]) Care Management  10/01/2020  Michelle Gregory April 05, 1923 [id number]   CSW spoke with pt's daughter by phone who reports her mother was able to get moved to [facility]/memory care.  Pt's daughter shares that her mother is adjusting well; daughter is struggling.  "I want her home".  CSW validated and offered support; reassuring her that she can always reconsider bringing her mother home or a different ALF.  Pt's daughter has good support from her husband and they visit about every other day.   Pt's daughter states they plan to wait until January before making any decisions; to give pt and them time.  Per daughter, she is looking into the [place] her mother has as well as finances as they assess home with hired help options.   CSW reminded daughter to call CSW If updates or needs arise before follow up call in early January.   Daughter appreciative of guidance and support.   Eduard Clos, MSW, [place] Worker  [place] [phone]

## 2020-10-02 DIAGNOSIS — M545 Low back pain, unspecified: Secondary | ICD-10-CM | POA: Diagnosis not present

## 2020-10-02 DIAGNOSIS — F0391 Unspecified dementia with behavioral disturbance: Secondary | ICD-10-CM | POA: Diagnosis not present

## 2020-10-02 DIAGNOSIS — F419 Anxiety disorder, unspecified: Secondary | ICD-10-CM | POA: Diagnosis not present

## 2020-10-02 DIAGNOSIS — Z8781 Personal history of (healed) traumatic fracture: Secondary | ICD-10-CM | POA: Diagnosis not present

## 2020-10-03 DIAGNOSIS — R300 Dysuria: Secondary | ICD-10-CM | POA: Diagnosis not present

## 2020-10-07 ENCOUNTER — Other Ambulatory Visit: Payer: Self-pay | Admitting: Family Medicine

## 2020-10-08 DIAGNOSIS — F0391 Unspecified dementia with behavioral disturbance: Secondary | ICD-10-CM | POA: Diagnosis not present

## 2020-10-08 DIAGNOSIS — Z79899 Other long term (current) drug therapy: Secondary | ICD-10-CM | POA: Diagnosis not present

## 2020-10-08 DIAGNOSIS — M1000 Idiopathic gout, unspecified site: Secondary | ICD-10-CM | POA: Diagnosis not present

## 2020-10-08 DIAGNOSIS — M6281 Muscle weakness (generalized): Secondary | ICD-10-CM | POA: Diagnosis not present

## 2020-10-08 DIAGNOSIS — F419 Anxiety disorder, unspecified: Secondary | ICD-10-CM | POA: Diagnosis not present

## 2020-10-09 DIAGNOSIS — M79669 Pain in unspecified lower leg: Secondary | ICD-10-CM | POA: Diagnosis not present

## 2020-10-09 DIAGNOSIS — R300 Dysuria: Secondary | ICD-10-CM | POA: Diagnosis not present

## 2020-10-09 DIAGNOSIS — R1030 Lower abdominal pain, unspecified: Secondary | ICD-10-CM | POA: Diagnosis not present

## 2020-10-09 DIAGNOSIS — N390 Urinary tract infection, site not specified: Secondary | ICD-10-CM | POA: Diagnosis not present

## 2020-10-15 ENCOUNTER — Other Ambulatory Visit: Payer: Self-pay

## 2020-10-15 ENCOUNTER — Telehealth: Payer: Self-pay

## 2020-10-15 ENCOUNTER — Ambulatory Visit: Payer: MEDICARE

## 2020-10-15 NOTE — Telephone Encounter (Signed)
Called patient to complete AWV. Patient's daughter [name]) stated that they forgot about this appointment. Patient is not doing well today and had a tough time last night. [name] said it is no way they can complete this visit today and she will call back at the beginning of the year and reschedule for another time.

## 2020-10-16 DIAGNOSIS — D224 Melanocytic nevi of scalp and neck: Secondary | ICD-10-CM | POA: Diagnosis not present

## 2020-10-16 DIAGNOSIS — L209 Atopic dermatitis, unspecified: Secondary | ICD-10-CM | POA: Diagnosis not present

## 2020-10-16 DIAGNOSIS — F0391 Unspecified dementia with behavioral disturbance: Secondary | ICD-10-CM | POA: Diagnosis not present

## 2020-11-05 DIAGNOSIS — Z20828 Contact with and (suspected) exposure to other viral communicable diseases: Secondary | ICD-10-CM | POA: Diagnosis not present

## 2020-11-06 ENCOUNTER — Other Ambulatory Visit: Payer: Self-pay | Admitting: *Deleted

## 2020-11-06 NOTE — Patient Outreach (Signed)
[facility]) Care Management  11/06/2020  KEYONI LAPINSKI 1923-01-09 [id number]   CSW attempted contact with pt/family today and was unsuccessful.  CSW was able to leave a HIPPA compliant voice message and will await callback or try again in 3-4 business days if no return call is received.   Reece Levy, MSW, LCSW Clinical Social Worker  [facility] [employer] [phone]

## 2020-11-11 ENCOUNTER — Other Ambulatory Visit: Payer: Self-pay | Admitting: *Deleted

## 2020-11-12 ENCOUNTER — Other Ambulatory Visit: Payer: Self-pay | Admitting: *Deleted

## 2020-11-12 NOTE — Patient Outreach (Addendum)
[facility]) Care Management  11/12/2020  Michelle Gregory September 16, 1923 [id number]   CSW attempted phone outreach to pt's family for updates on pt's adjustment to ALF/memory care.  CSW left a HIPPA compliant voice message and will await call back or try again per policy.  CSW has also mailed an Unsuccessful Outreach letter to [name].   Eduard Clos, MSW, [place] Worker  [place] [phone]

## 2020-11-14 ENCOUNTER — Other Ambulatory Visit: Payer: Self-pay | Admitting: *Deleted

## 2020-11-15 NOTE — Patient Outreach (Signed)
[place] [facility]) Care Management  11/15/2020  Michelle Gregory 07/01/1923 [id number]   CSW spoke with pt's daughter who reports her mother continues to adjust to the Memory Care ALF.   "I think she's dong better than we are".  CSW acknowledged her feelings related to the "empty nest" they are experiencing.  CSW suggested daughter and son in law seek support and opportunities for themselves. CSW encouraged daughter to allow herself to be the daughter and not the caregiver and allow the ALF staff to be caregiver. CSW advised daughter of plans to sign off at this time.  CSW encouraged daughter to reach out if needs arise. CSW will advise PCP and [facility] team of above.   Eduard Clos, MSW, [place] Worker  [place] [phone]

## 2020-11-27 DIAGNOSIS — U071 COVID-19: Secondary | ICD-10-CM | POA: Diagnosis not present

## 2020-11-27 DIAGNOSIS — F0391 Unspecified dementia with behavioral disturbance: Secondary | ICD-10-CM | POA: Diagnosis not present

## 2020-12-04 DIAGNOSIS — F0391 Unspecified dementia with behavioral disturbance: Secondary | ICD-10-CM | POA: Diagnosis not present

## 2020-12-04 DIAGNOSIS — U071 COVID-19: Secondary | ICD-10-CM | POA: Diagnosis not present

## 2020-12-11 ENCOUNTER — Emergency Department (HOSPITAL_COMMUNITY): Payer: MEDICARE

## 2020-12-11 ENCOUNTER — Emergency Department (HOSPITAL_COMMUNITY)
Admission: EM | Admit: 2020-12-11 | Discharge: 2020-12-11 | Disposition: A | Discharge status: Home / Self Care | Payer: MEDICARE | Attending: Emergency Medicine | Admitting: Emergency Medicine

## 2020-12-11 ENCOUNTER — Other Ambulatory Visit: Payer: Self-pay

## 2020-12-11 DIAGNOSIS — W01198A Fall on same level from slipping, tripping and stumbling with subsequent striking against other object, initial encounter: Secondary | ICD-10-CM | POA: Insufficient documentation

## 2020-12-11 DIAGNOSIS — S199XXA Unspecified injury of neck, initial encounter: Secondary | ICD-10-CM | POA: Diagnosis not present

## 2020-12-11 DIAGNOSIS — Z7982 Long term (current) use of aspirin: Secondary | ICD-10-CM | POA: Diagnosis not present

## 2020-12-11 DIAGNOSIS — R279 Unspecified lack of coordination: Secondary | ICD-10-CM | POA: Diagnosis not present

## 2020-12-11 DIAGNOSIS — Z85038 Personal history of other malignant neoplasm of large intestine: Secondary | ICD-10-CM | POA: Diagnosis not present

## 2020-12-11 DIAGNOSIS — I10 Essential (primary) hypertension: Secondary | ICD-10-CM | POA: Diagnosis not present

## 2020-12-11 DIAGNOSIS — M549 Dorsalgia, unspecified: Secondary | ICD-10-CM | POA: Diagnosis not present

## 2020-12-11 DIAGNOSIS — R5381 Other malaise: Secondary | ICD-10-CM | POA: Diagnosis not present

## 2020-12-11 DIAGNOSIS — S0990XA Unspecified injury of head, initial encounter: Secondary | ICD-10-CM | POA: Diagnosis not present

## 2020-12-11 DIAGNOSIS — R410 Disorientation, unspecified: Secondary | ICD-10-CM | POA: Diagnosis not present

## 2020-12-11 DIAGNOSIS — Z743 Need for continuous supervision: Secondary | ICD-10-CM | POA: Diagnosis not present

## 2020-12-11 DIAGNOSIS — R0781 Pleurodynia: Secondary | ICD-10-CM | POA: Diagnosis not present

## 2020-12-11 DIAGNOSIS — S2241XA Multiple fractures of ribs, right side, initial encounter for closed fracture: Secondary | ICD-10-CM | POA: Insufficient documentation

## 2020-12-11 DIAGNOSIS — Z79899 Other long term (current) drug therapy: Secondary | ICD-10-CM | POA: Insufficient documentation

## 2020-12-11 DIAGNOSIS — S299XXA Unspecified injury of thorax, initial encounter: Secondary | ICD-10-CM | POA: Diagnosis present

## 2020-12-11 DIAGNOSIS — Y92002 Bathroom of unspecified non-institutional (private) residence single-family (private) house as the place of occurrence of the external cause: Secondary | ICD-10-CM | POA: Diagnosis not present

## 2020-12-11 DIAGNOSIS — R0902 Hypoxemia: Secondary | ICD-10-CM | POA: Diagnosis not present

## 2020-12-11 DIAGNOSIS — W06XXXA Fall from bed, initial encounter: Secondary | ICD-10-CM | POA: Diagnosis not present

## 2020-12-11 DIAGNOSIS — I700 Atherosclerosis of aorta: Secondary | ICD-10-CM | POA: Diagnosis not present

## 2020-12-11 DIAGNOSIS — F0391 Unspecified dementia with behavioral disturbance: Secondary | ICD-10-CM | POA: Diagnosis not present

## 2020-12-11 DIAGNOSIS — Z043 Encounter for examination and observation following other accident: Secondary | ICD-10-CM | POA: Diagnosis not present

## 2020-12-11 DIAGNOSIS — M25511 Pain in right shoulder: Secondary | ICD-10-CM | POA: Diagnosis not present

## 2020-12-11 NOTE — ED Provider Notes (Signed)
[place] DEPT Provider Note   CSN: [id number] Arrival date & time: 12/11/20  0216     History Chief Complaint  Patient presents with  . Rib Injury    Michelle Gregory is a 87 y.o. female.  Patient presents to the emergency department after a fall.  Patient was attempting to get to the bathroom when she lost her balance and fell.  She reports that initially she had severe pain up under the right shoulder area but the pain seems to have improved.  She denies extremity pain.  She does not think she hit her head.  She does have history of dementia.        Past Medical History:  Diagnosis Date  . Allergic rhinitis   . Anxiety   . C. difficile diarrhea 10/2017  . Cancer ([place])   . Depression   . GERD (gastroesophageal reflux disease)   . History of colon cancer   . History of recurrent UTIs   . Hyperlipidemia   . Hypertension   . Osteoarthritis   . Spinal compression fracture ([place])   . Vertigo     Patient Active Problem List   Diagnosis Date Noted  . Sepsis ([place]) 08/02/2020  . Lactic acidosis 08/02/2020  . Acute metabolic encephalopathy 26/37/8588  . Screening-pulmonary TB 07/24/2020  . Syncope 05/23/2020  . Sundowning 05/23/2020  . Dementia with behavioral disturbance ([place])   . Acute blood loss as cause of postoperative anemia 06/27/2019  . History of pelvic fracture 06/27/2019  . Closed displaced intertrochanteric fracture of left femur ([place]) 06/21/2019  . Anxiety with depression 06/21/2019  . Diarrhea 03/24/2019  . Shingles 01/23/2019  . Fracture, ulna, distal 12/06/2018  . Thrombocytopenia ([place]) 12/06/2018  . Dementia without behavioral disturbance ([place]) 11/22/2018  . Hand injury, left, initial encounter 09/01/2018  . H/O falling 05/03/2018  . Episodic confusion 05/03/2018  . Fall at home 03/29/2018  . Anxiety disorder 03/29/2018  . Generalized weakness 03/29/2018  . Chronic pain 02/09/2018  . Cerumen impaction 12/06/2017  .  H/O Clostridium difficile infection 10/13/2017  . Recurrent UTI (urinary tract infection) 09/06/2017  . Auditory hallucinations 03/03/2017  . Female cystocele 04/13/2016  . Bowel habit changes 12/30/2015  . Compression fracture 12/30/2015  . Routine general medical examination at a health care facility 04/09/2015  . [place] intertrigo 03/28/2014  . Bilateral hearing loss due to cerumen impaction 01/17/2014  . Encounter for Medicare annual wellness exam 03/21/2013  . Gout 09/19/2012  . Age related osteoporosis 05/05/2011  . Degenerative lumbar disc 01/20/2011  . VARICOSE VEINS, LOWER EXTREMITIES 09/04/2010  . Prediabetes 01/08/2010  . Hyperlipidemia 05/09/2008  . Essential hypertension 05/09/2008  . HEMORRHOIDS, INTERNAL 05/09/2008  . Allergic rhinitis 05/09/2008  . Mild reactive airways disease 05/09/2008  . GERD 05/09/2008  . IBS 05/09/2008  . Osteoarthritis 05/09/2008  . History of malignant neoplasm of large intestine 05/09/2008    Past Surgical History:  Procedure Laterality Date  . APPENDECTOMY    . CATARACT EXTRACTION    . COLON RESECTION    . INTRAMEDULLARY (IM) NAIL INTERTROCHANTERIC Left 06/22/2019   Procedure: INTRAMEDULLARY (IM) NAIL INTERTROCHANTRIC;  Surgeon: Rod Can, MD;  Location: [facility] ORS;  Service: Orthopedics;  Laterality: Left;  . TONSILLECTOMY       OB History   No obstetric history on file.     Family History  Problem Relation Age of Onset  . Prostate cancer Father   . Heart attack Mother   . Gout Mother  Social History   Tobacco Use  . Smoking status: Never Smoker  . Smokeless tobacco: Never Used  Substance Use Topics  . Alcohol use: No    Alcohol/week: 0.0 standard drinks  . Drug use: No    Home Medications Prior to Admission medications   Medication Sig Start Date End Date Taking? Authorizing Provider  allopurinol (ZYLOPRIM) 100 MG tablet TAKE 2 TABLETS BY MOUTH  DAILY Patient taking differently: Take 100 mg by mouth  daily.  01/03/20   Tower, [name], MD  Ascorbic Acid (VITAMIN C) 100 MG tablet Take 100 mg by mouth daily.    [provider]  aspirin 81 MG tablet Take 81 mg by mouth daily.      [provider]  calcium-vitamin D (CALCIUM 500 +D) 500 MG tablet Take 1 tablet by mouth daily.     [provider]  CRANBERRY PO Take 1 capsule by mouth 2 (two) times daily.    [provider]  memantine (NAMENDA) 5 MG tablet Take 1 tablet (5 mg total) by mouth 2 (two) times daily. 07/24/20   Tower, [name], MD  metoprolol tartrate (LOPRESSOR) 50 MG tablet TAKE 1 TABLET BY MOUTH  TWICE DAILY 10/08/20   Tower, [name], MD  mirtazapine (REMERON) 15 MG tablet Take 1 tablet (15 mg total) by mouth at bedtime. 07/13/19   Tower, [name], MD  multivitamin [facility]) per tablet Take 1 tablet by mouth daily.      [provider]  Probiotic Product (PROBIOTIC DAILY PO) Take 1 tablet by mouth daily.    [provider]  risperiDONE (RISPERDAL) 1 MG/ML oral solution Give 0.5 mg po in am and 1 mg in pm 09/05/20   Tower, [name], MD    Allergies    Amoxicillin-pot clavulanate, Keflex [cephalexin], and Septra [sulfamethoxazole-trimethoprim]  Review of Systems   Review of Systems  Musculoskeletal: Positive for arthralgias and back pain.  All other systems reviewed and are negative.   Physical Exam Updated Vital Signs BP (!) 156/95 (BP Location: Right Arm)   Pulse 79   Temp 98.1 F (36.7 C) (Oral)   Resp 17   Ht 5' (1.524 m)   Wt 48 kg   LMP 11/02/1980   SpO2 96%   BMI 20.67 kg/m   Physical Exam Vitals and nursing note reviewed.  Constitutional:      General: She is not in acute distress.    Appearance: Normal appearance. She is well-developed and well-nourished.  HENT:     Head: Normocephalic and atraumatic.     Right Ear: Hearing normal.     Left Ear: Hearing normal.     Nose: Nose normal.     Mouth/Throat:     Mouth: Oropharynx is clear and moist and mucous  membranes are normal.  Eyes:     Extraocular Movements: EOM normal.     Conjunctiva/sclera: Conjunctivae normal.     Pupils: Pupils are equal, round, and reactive to light.  Cardiovascular:     Rate and Rhythm: Regular rhythm.     Heart sounds: S1 normal and S2 normal. No murmur heard. No friction rub. No gallop.   Pulmonary:     Effort: Pulmonary effort is normal. No respiratory distress.     Breath sounds: Normal breath sounds.  Chest:     Chest wall: No tenderness.  Abdominal:     General: Bowel sounds are normal.     Palpations: Abdomen is soft. There is no hepatosplenomegaly.  Tenderness: There is no abdominal tenderness. There is no guarding or rebound. Negative signs include Murphy's sign and McBurney's sign.     Hernia: No hernia is present.  Musculoskeletal:        General: Normal range of motion.     Cervical back: Normal range of motion and neck supple.     Thoracic back: Tenderness present.       Back:     Right hip: No deformity or tenderness. Normal range of motion.     Left hip: No deformity or tenderness. Normal range of motion.  Skin:    General: Skin is warm, dry and intact.     Findings: No rash.     Nails: There is no cyanosis.  Neurological:     Mental Status: She is alert. Mental status is at baseline.     GCS: GCS eye subscore is 4. GCS verbal subscore is 5. GCS motor subscore is 6.     Cranial Nerves: No cranial nerve deficit.     Sensory: No sensory deficit.     Coordination: Coordination normal.     Deep Tendon Reflexes: Strength normal.  Psychiatric:        Mood and Affect: Mood and affect normal.        Speech: Speech normal.        Behavior: Behavior normal.        Thought Content: Thought content normal.     ED Results / Procedures / Treatments   Labs (all labs ordered are listed, but only abnormal results are displayed) Labs Reviewed - No data to display  EKG None  Radiology DG Ribs Unilateral W/Chest Right  Result Date:  12/11/2020 CLINICAL DATA:  Fall right-sided pain EXAM: RIGHT RIBS AND CHEST - 3+ VIEW COMPARISON:  None. FINDINGS: There is nondisplaced lateral posterior rib fractures of the right ninth and tenth ribs. Healed rib fractures are noted within the right eleventh and twelfth ribs. No definite pneumothorax. There is aortic calcifications noted. Diffuse osteopenia seen. IMPRESSION: Nondisplaced posterolateral ninth and tenth rib fractures. Electronically Signed   By: Prudencio Pair M.D.   On: 12/11/2020 03:32   DG Thoracic Spine 2 View  Result Date: 12/11/2020 CLINICAL DATA:  Fall EXAM: THORACIC SPINE 2 VIEWS; LUMBAR SPINE - COMPLETE 4+ VIEW COMPARISON:  June 21, 2019 FINDINGS: There is chronic compression deformity of the L2 vertebral body with cement fixation. There is also superior compression deformity of the T12 vertebral body with approximately 30% loss in anterior vertebral body height. This appears not significantly changed since prior exam dating back to 2020. There is a severe chronic compression deformity of the T9 vertebral body with greater than 75% loss in vertebral body height. This is not significantly changed since prior exam of 2019. No acute fracture is seen. There is diffuse osteopenia. Scattered vascular calcifications are noted. There is degenerative changes seen throughout the thoracolumbar spine. This is most notable at L4-L5 and L5-S1 with mild disc height loss and facet arthropathy. IMPRESSION: Chronic compression deformities of the T12 and T9 vertebral bodies not significantly changed since prior exam. No acute fracture or malalignment Prior cement fixation of the L2 vertebral body. Electronically Signed   By: Prudencio Pair M.D.   On: 12/11/2020 03:30   DG Lumbar Spine Complete  Result Date: 12/11/2020 CLINICAL DATA:  Fall EXAM: THORACIC SPINE 2 VIEWS; LUMBAR SPINE - COMPLETE 4+ VIEW COMPARISON:  June 21, 2019 FINDINGS: There is chronic compression deformity of the L2 vertebral body  with  cement fixation. There is also superior compression deformity of the T12 vertebral body with approximately 30% loss in anterior vertebral body height. This appears not significantly changed since prior exam dating back to 2020. There is a severe chronic compression deformity of the T9 vertebral body with greater than 75% loss in vertebral body height. This is not significantly changed since prior exam of 2019. No acute fracture is seen. There is diffuse osteopenia. Scattered vascular calcifications are noted. There is degenerative changes seen throughout the thoracolumbar spine. This is most notable at L4-L5 and L5-S1 with mild disc height loss and facet arthropathy. IMPRESSION: Chronic compression deformities of the T12 and T9 vertebral bodies not significantly changed since prior exam. No acute fracture or malalignment Prior cement fixation of the L2 vertebral body. Electronically Signed   By: Prudencio Pair M.D.   On: 12/11/2020 03:30   DG Shoulder Right  Result Date: 12/11/2020 CLINICAL DATA:  Fall shoulder pain EXAM: RIGHT SHOULDER - 2+ VIEW COMPARISON:  None. FINDINGS: There is no evidence of fracture or dislocation. There is diffuse osteopenia. A high-riding humeral head is noted. Glenohumeral joint osteoarthritis is seen with joint space loss and marginal osteophyte formation. : No acute osseous abnormality Electronically Signed   By: Prudencio Pair M.D.   On: 12/11/2020 03:33   CT HEAD WO CONTRAST  Result Date: 12/11/2020 CLINICAL DATA:  Head trauma EXAM: CT HEAD WITHOUT CONTRAST CT CERVICAL SPINE WITHOUT CONTRAST TECHNIQUE: Multidetector CT imaging of the head and cervical spine was performed following the standard protocol without intravenous contrast. Multiplanar CT image reconstructions of the cervical spine were also generated. COMPARISON:  None. FINDINGS: CT HEAD FINDINGS Brain: There is no mass, hemorrhage or extra-axial collection. The size and configuration of the ventricles and extra-axial CSF  spaces are normal. There is hypoattenuation of the periventricular white matter, most commonly indicating chronic ischemic microangiopathy. Vascular: No abnormal hyperdensity of the major intracranial arteries or dural venous sinuses. No intracranial atherosclerosis. Skull: No skull fracture. Sinuses/Orbits: Partial opacification of the left frontal sinus. The orbits are normal. CT CERVICAL SPINE FINDINGS Alignment: No static subluxation. Facets are aligned. Occipital condyles are normally positioned. Skull base and vertebrae: No acute fracture. Soft tissues and spinal canal: No prevertebral fluid or swelling. No visible canal hematoma. Disc levels: Multilevel facet hypertrophy. Upper chest: No pneumothorax, pulmonary nodule or pleural effusion. Other: Normal visualized paraspinal cervical soft tissues. IMPRESSION: 1. Chronic ischemic microangiopathy without acute intracranial abnormality. 2. No acute fracture or static subluxation of the cervical spine. Electronically Signed   By: Ulyses Jarred M.D.   On: 12/11/2020 03:42   CT CERVICAL SPINE WO CONTRAST  Result Date: 12/11/2020 CLINICAL DATA:  Head trauma EXAM: CT HEAD WITHOUT CONTRAST CT CERVICAL SPINE WITHOUT CONTRAST TECHNIQUE: Multidetector CT imaging of the head and cervical spine was performed following the standard protocol without intravenous contrast. Multiplanar CT image reconstructions of the cervical spine were also generated. COMPARISON:  None. FINDINGS: CT HEAD FINDINGS Brain: There is no mass, hemorrhage or extra-axial collection. The size and configuration of the ventricles and extra-axial CSF spaces are normal. There is hypoattenuation of the periventricular white matter, most commonly indicating chronic ischemic microangiopathy. Vascular: No abnormal hyperdensity of the major intracranial arteries or dural venous sinuses. No intracranial atherosclerosis. Skull: No skull fracture. Sinuses/Orbits: Partial opacification of the left frontal sinus.  The orbits are normal. CT CERVICAL SPINE FINDINGS Alignment: No static subluxation. Facets are aligned. Occipital condyles are normally positioned. Skull base and vertebrae:  No acute fracture. Soft tissues and spinal canal: No prevertebral fluid or swelling. No visible canal hematoma. Disc levels: Multilevel facet hypertrophy. Upper chest: No pneumothorax, pulmonary nodule or pleural effusion. Other: Normal visualized paraspinal cervical soft tissues. IMPRESSION: 1. Chronic ischemic microangiopathy without acute intracranial abnormality. 2. No acute fracture or static subluxation of the cervical spine. Electronically Signed   By: Ulyses Jarred M.D.   On: 12/11/2020 03:42    Procedures Procedures   Medications Ordered in ED Medications - No data to display  ED Course  I have reviewed the triage vital signs and the nursing notes.  Pertinent labs & imaging results that were available during my care of the patient were reviewed by me and considered in my medical decision making (see chart for details).    MDM Rules/Calculators/A&P                          Patient presents to the emergency department for evaluation after a fall.  Patient slipped in the bathroom and fell to the ground.  She does have history of some dementia, but seems to be able to recall the fall.  Her only complaint is pain in her right upper and mid back area.  She reports that it was severe when she first fell but at arrival she is no longer having any significant pain.  Remainder of examination was unremarkable.  CT head, cervical spine, x-ray of thoracic and lumbar spine shows no acute pathology.  X-ray of right ribs reveals posterior right ninth and 10th rib fractures which explains her pain.  Extremities are unremarkable.  This includes normal range of motion of the hips.  Daughter is present at the bedside, injuries were discussed with her and patient will be appropriate for return to nursing home.  She is chronically on  hydrocodone, recommend using this for her rib pain.  Final Clinical Impression(s) / ED Diagnoses Final diagnoses:  Closed fracture of multiple ribs of right side, initial encounter    Rx / DC Orders ED Discharge Orders    None       Tarshia Kot, Gwenyth Allegra, MD 12/11/20 [phone]

## 2020-12-11 NOTE — ED Notes (Signed)
Pt noted to not have pants on.  Sts she did not come in w/ pants.

## 2020-12-11 NOTE — ED Triage Notes (Signed)
Pt reports falling in bathroom and c/o right flank/rib pain

## 2020-12-11 NOTE — ED Notes (Signed)
Pt returned from xray

## 2020-12-17 IMAGING — CT CT HEAD WITHOUT CONTRAST
3 series · 18 of 30 positions shown, 19 images · non-contrast
Comparison: CT head 03/19/2019

CLINICAL DATA: Fall.  Headache.

EXAM:
CT HEAD WITHOUT CONTRAST
CT CERVICAL SPINE WITHOUT CONTRAST
TECHNIQUE: Multidetector CT imaging of the head and cervical spine was
performed following the standard protocol without intravenous
contrast. Multiplanar CT image reconstructions of the cervical spine
were also generated.

[Series 4: head wo · axial · 0.47mm/px · z∈[+1358,+1408]mm · 2 of 32 slices shown, 3 images]
[im 11/32  brain]
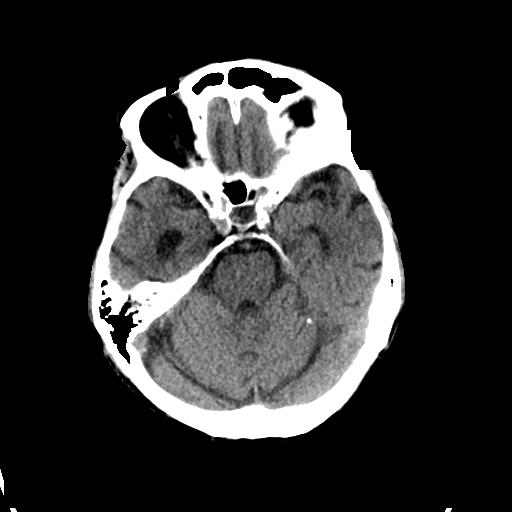
[im 11/32  bone]
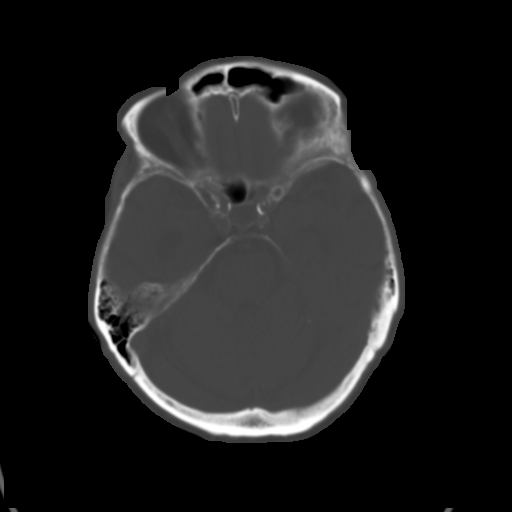
[im 21/32  brain]
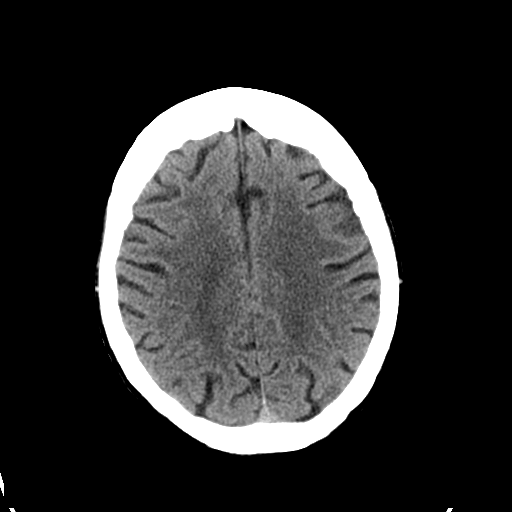

[Series 10: c spine soft · axial · 0.34mm/px · z∈[+1160,+1304]mm · 8 of 88 slices shown]
[im 8/88  brain]
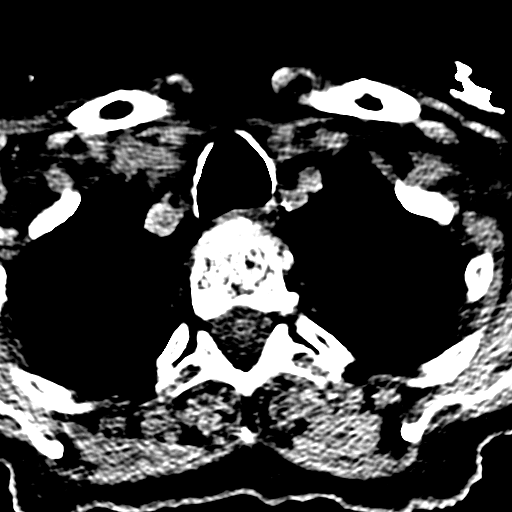
[im 22/88  brain]
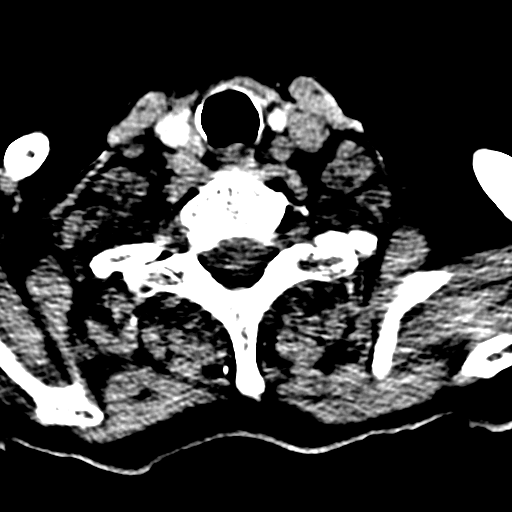
[im 30/88  brain]
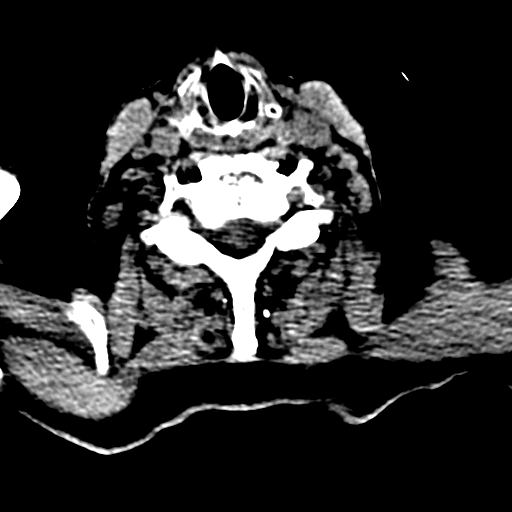
[im 37/88  brain]
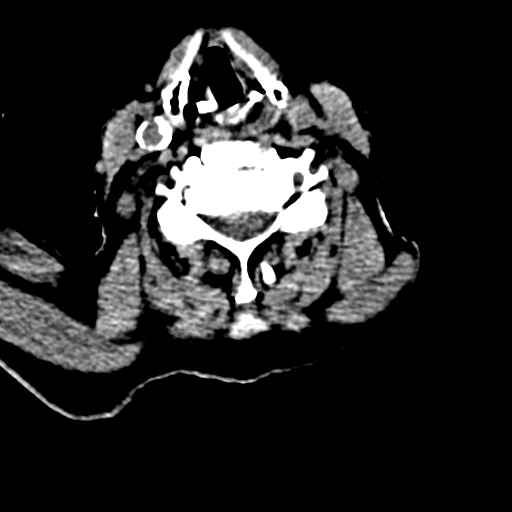
[im 51/88  brain]
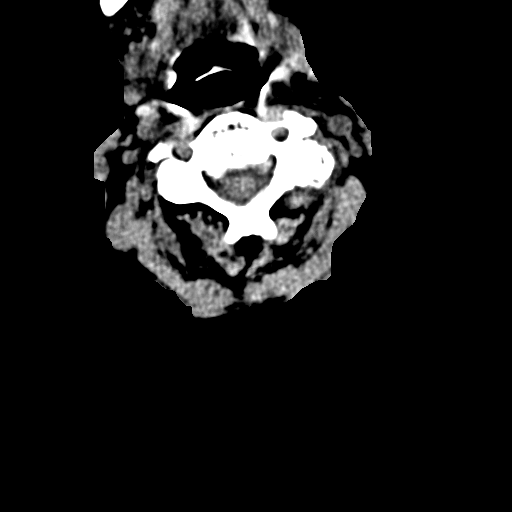
[im 59/88  brain]
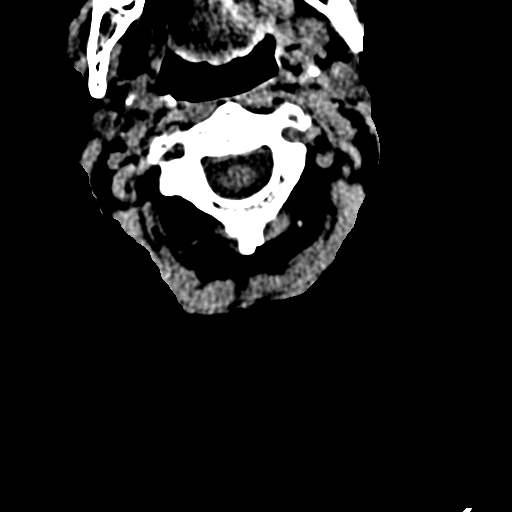
[im 66/88  brain]
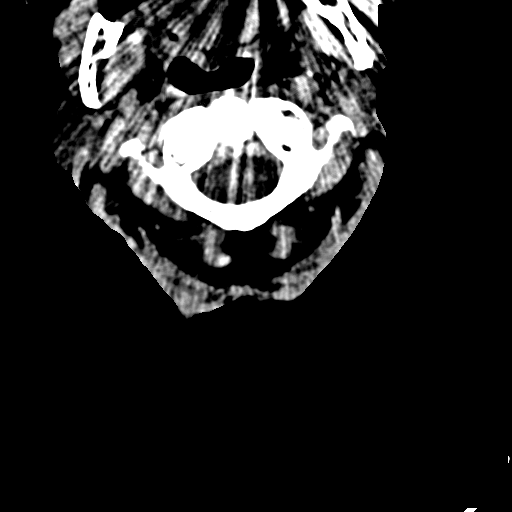
[im 80/88  brain]
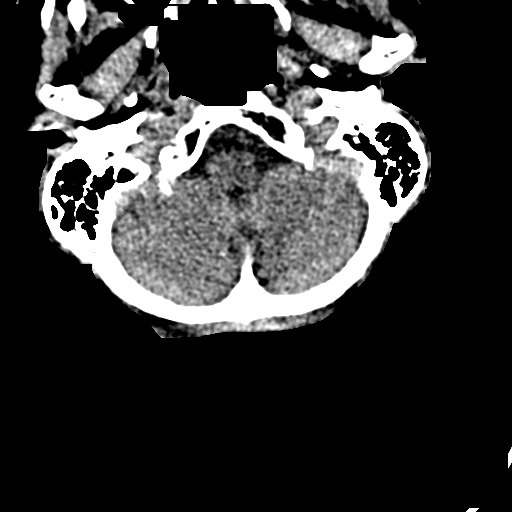

[Series 11: orthogonal bone · axial · 0.23mm/px · z∈[+1143,+1275]mm · 8 of 87 slices shown]
[im 8/87  bone]
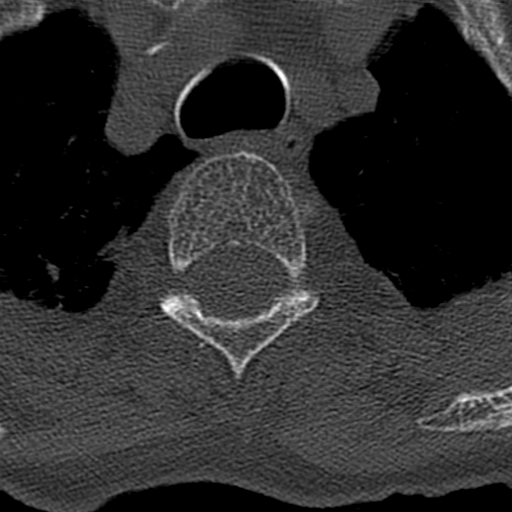
[im 22/87  bone]
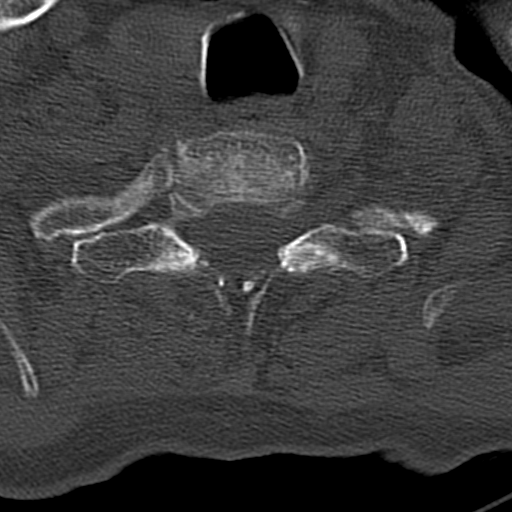
[im 29/87  bone]
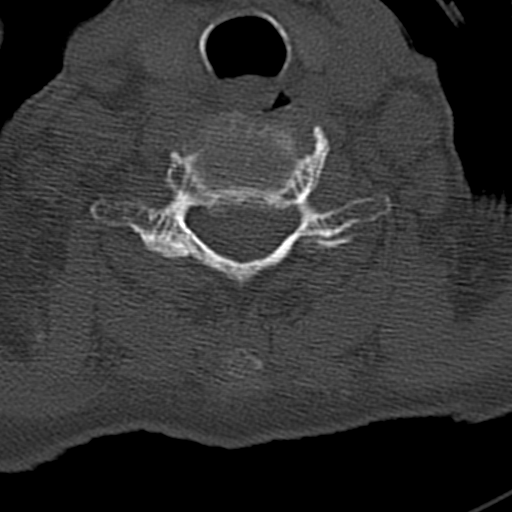
[im 36/87  bone]
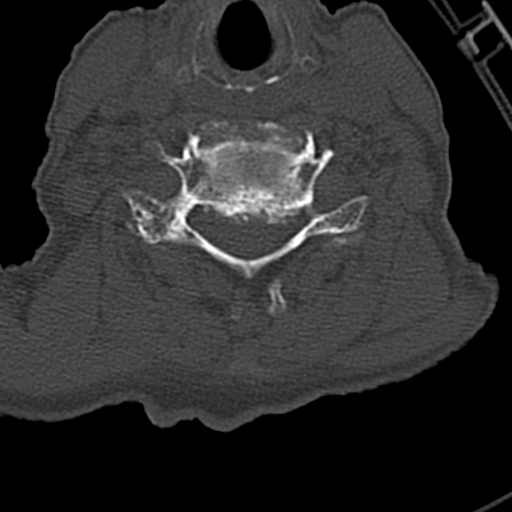
[im 51/87  bone]
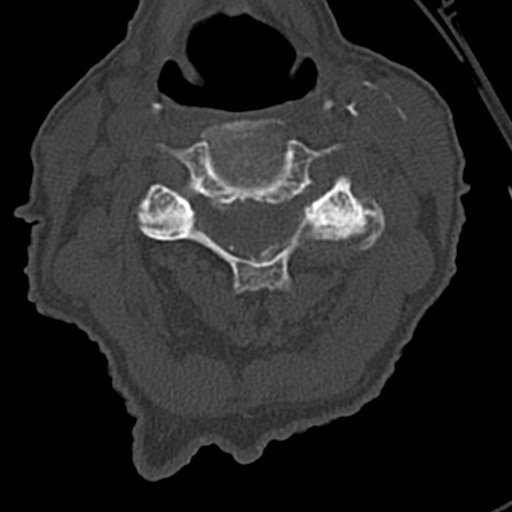
[im 58/87  bone]
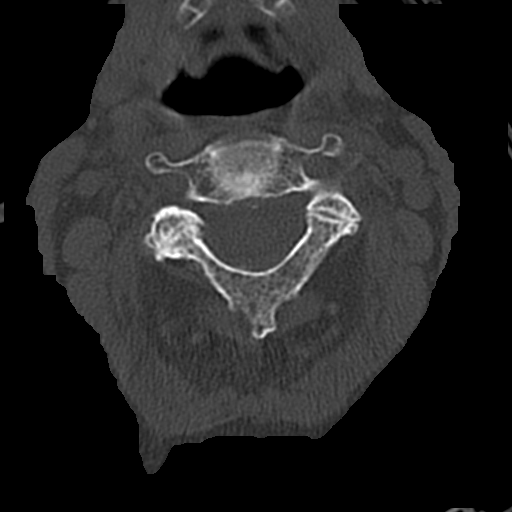
[im 65/87  bone]
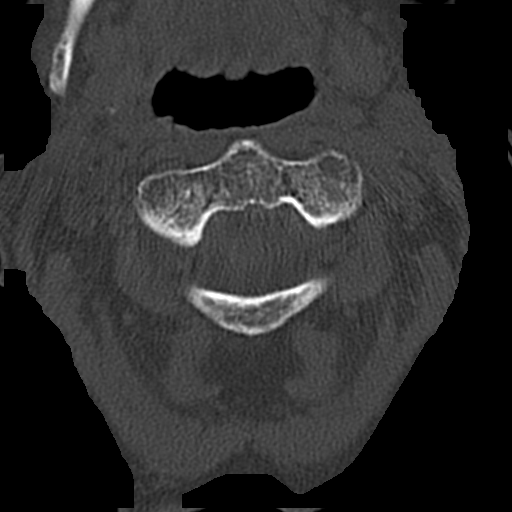
[im 79/87  bone]
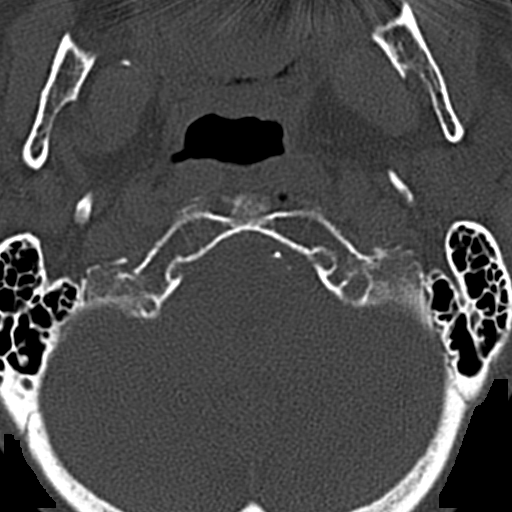

[18 of 30 positions shown; findings below may reference images not displayed]

FINDINGS: CT HEAD FINDINGS

Brain: Generalized atrophy.  Mild white matter changes.

Negative for acute infarct, hemorrhage, mass.  No midline shift.

Vascular: Negative for hyperdense vessel. Atherosclerotic
calcification.

Skull: Negative for skull fracture

Sinuses/Orbits: Mild mucosal edema paranasal sinuses. Bilateral
cataract surgery.

Other: None

CT CERVICAL SPINE FINDINGS

Alignment: Mild anterolisthesis C4-5. Straightening of the cervical
lordosis.

Skull base and vertebrae: Negative for fracture

Soft tissues and spinal canal: Atherosclerotic carotid bifurcation
bilaterally. No soft tissue mass

Disc levels: Multilevel cervical spondylosis with disc degeneration
and facet degeneration. Disc space narrowing and spurring most
prominent C4-5 and C5-6. Moderate spinal stenosis at C5-6.

Upper chest: Lung apices clear bilaterally. Apically scarring
bilaterally.

Other: None
IMPRESSION: 1. Atrophy and chronic microvascular ischemia. No acute intracranial
abnormality.
2. Cervical spondylosis without acute fracture.

## 2020-12-18 IMAGING — DX PORTABLE PELVIS 1-2 VIEWS
1 series · 1 of 1 positions shown · non-contrast
Comparison: 06/21/2019

CLINICAL DATA: Postop left femur

EXAM:
PORTABLE PELVIS 1-2 VIEWS

[pelvis ap]
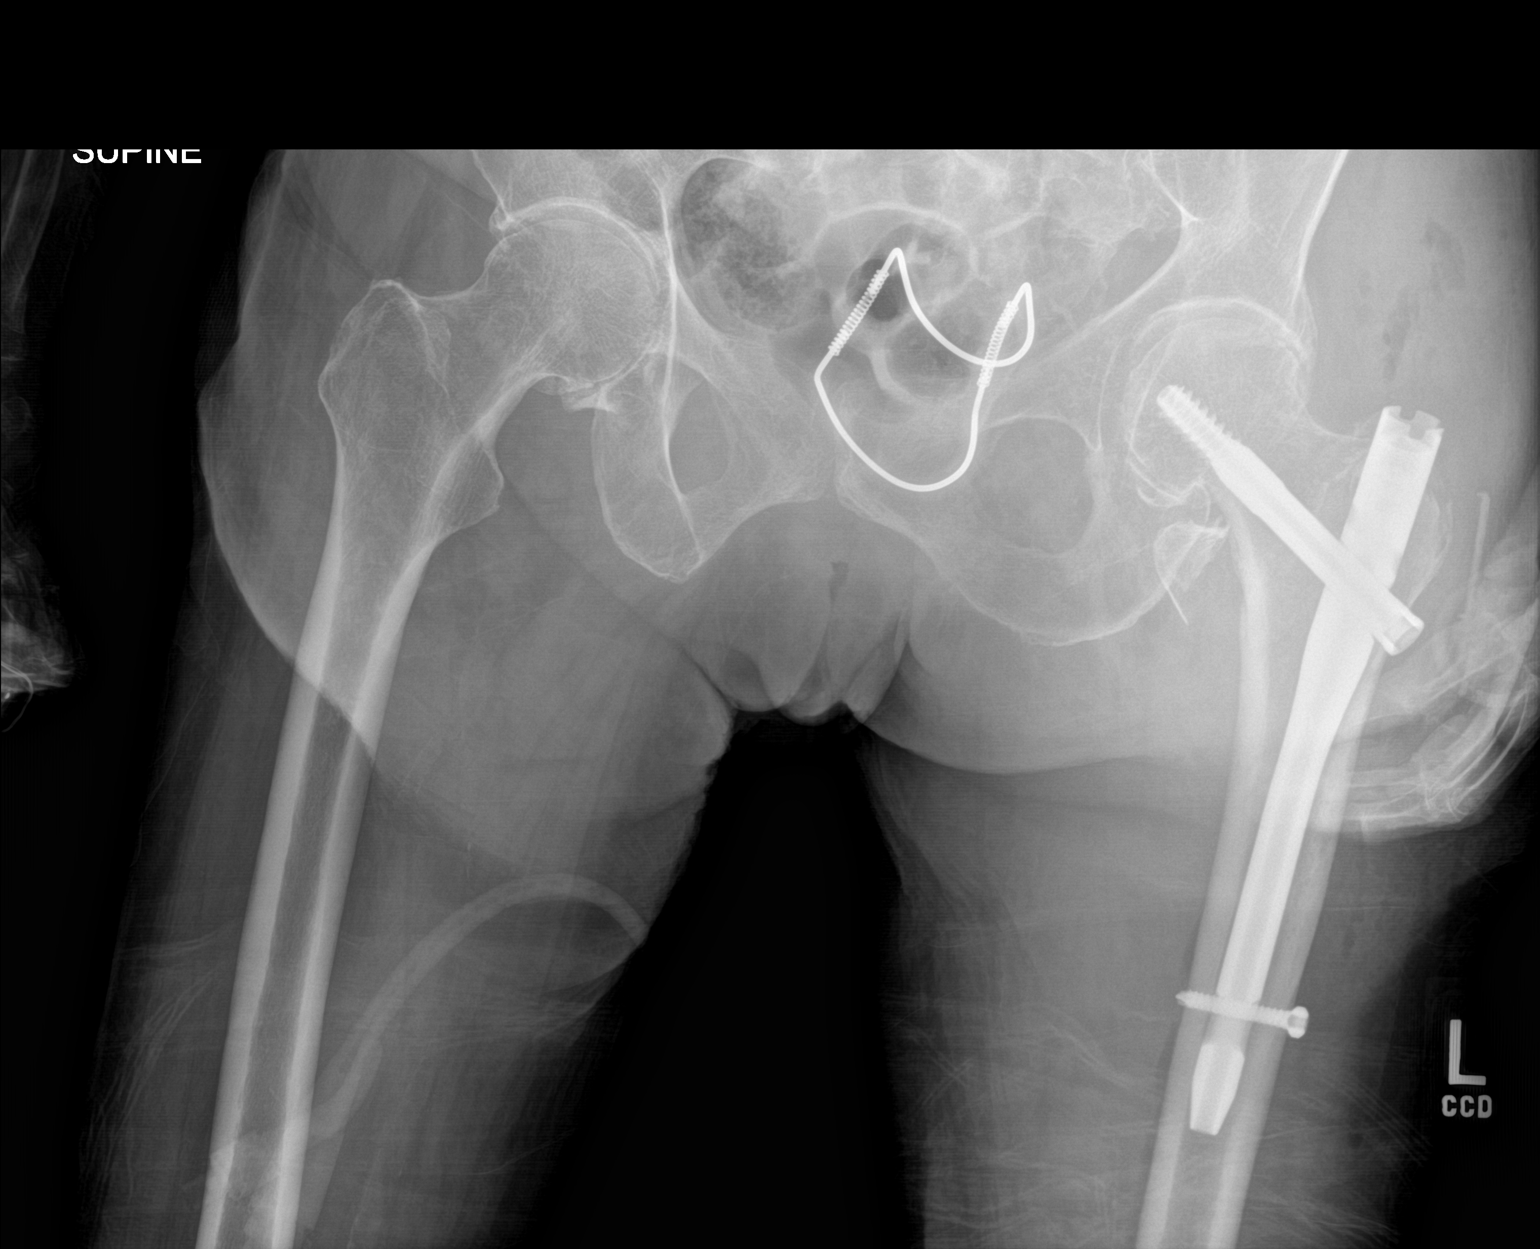

[1 of 1 positions shown; findings below may reference images not displayed]

FINDINGS: Internal fixation across the left femoral intertrochanteric
fracture. Lesser trochanter remains displaced, otherwise anatomic
alignment. No visible hardware complicating feature.
IMPRESSION: Internal fixation across the left intertrochanteric fracture as
above.

## 2020-12-26 DIAGNOSIS — N390 Urinary tract infection, site not specified: Secondary | ICD-10-CM | POA: Diagnosis not present

## 2021-01-01 DIAGNOSIS — F0391 Unspecified dementia with behavioral disturbance: Secondary | ICD-10-CM | POA: Diagnosis not present

## 2021-01-01 DIAGNOSIS — N390 Urinary tract infection, site not specified: Secondary | ICD-10-CM | POA: Diagnosis not present

## 2021-01-08 ENCOUNTER — Encounter (HOSPITAL_COMMUNITY): Payer: Self-pay

## 2021-01-08 ENCOUNTER — Other Ambulatory Visit: Payer: Self-pay

## 2021-01-08 ENCOUNTER — Emergency Department (HOSPITAL_COMMUNITY)
Admission: EM | Admit: 2021-01-08 | Discharge: 2021-01-08 | Disposition: A | Discharge status: Home / Self Care | Payer: MEDICARE | Attending: Emergency Medicine | Admitting: Emergency Medicine

## 2021-01-08 ENCOUNTER — Emergency Department (HOSPITAL_COMMUNITY): Payer: MEDICARE

## 2021-01-08 DIAGNOSIS — R410 Disorientation, unspecified: Secondary | ICD-10-CM | POA: Diagnosis not present

## 2021-01-08 DIAGNOSIS — Z79899 Other long term (current) drug therapy: Secondary | ICD-10-CM | POA: Insufficient documentation

## 2021-01-08 DIAGNOSIS — Z743 Need for continuous supervision: Secondary | ICD-10-CM | POA: Diagnosis not present

## 2021-01-08 DIAGNOSIS — F0391 Unspecified dementia with behavioral disturbance: Secondary | ICD-10-CM | POA: Diagnosis not present

## 2021-01-08 DIAGNOSIS — R0902 Hypoxemia: Secondary | ICD-10-CM | POA: Diagnosis not present

## 2021-01-08 DIAGNOSIS — Z043 Encounter for examination and observation following other accident: Secondary | ICD-10-CM | POA: Diagnosis not present

## 2021-01-08 DIAGNOSIS — R404 Transient alteration of awareness: Secondary | ICD-10-CM | POA: Diagnosis not present

## 2021-01-08 DIAGNOSIS — Z85038 Personal history of other malignant neoplasm of large intestine: Secondary | ICD-10-CM | POA: Diagnosis not present

## 2021-01-08 DIAGNOSIS — S0990XA Unspecified injury of head, initial encounter: Secondary | ICD-10-CM | POA: Diagnosis not present

## 2021-01-08 DIAGNOSIS — R279 Unspecified lack of coordination: Secondary | ICD-10-CM | POA: Diagnosis not present

## 2021-01-08 DIAGNOSIS — W19XXXA Unspecified fall, initial encounter: Secondary | ICD-10-CM | POA: Diagnosis not present

## 2021-01-08 DIAGNOSIS — Y92129 Unspecified place in nursing home as the place of occurrence of the external cause: Secondary | ICD-10-CM | POA: Diagnosis not present

## 2021-01-08 DIAGNOSIS — F0390 Unspecified dementia without behavioral disturbance: Secondary | ICD-10-CM | POA: Diagnosis not present

## 2021-01-08 DIAGNOSIS — I10 Essential (primary) hypertension: Secondary | ICD-10-CM | POA: Insufficient documentation

## 2021-01-08 DIAGNOSIS — Z7982 Long term (current) use of aspirin: Secondary | ICD-10-CM | POA: Diagnosis not present

## 2021-01-08 NOTE — ED Provider Notes (Signed)
[facility] DEPT Provider Note   CSN: [id number] Arrival date & time: 01/08/21  0448     History No chief complaint on file.   Michelle Gregory is a 87 y.o. female.  The history is provided by the EMS personnel. The history is limited by the condition of the patient (level 5 caveat dementia).  Fall This is a new problem. The current episode started less than 1 hour ago. The problem occurs constantly. The problem has not changed since onset.Pertinent negatives include no headaches. Nothing aggravates the symptoms. Nothing relieves the symptoms. She has tried nothing for the symptoms. The treatment provided no relief.  Unwitnessed fall at nursing home.  No complaints.       Past Medical History:  Diagnosis Date  . Allergic rhinitis   . Anxiety   . C. difficile diarrhea 10/2017  . Cancer ([place])   . Depression   . GERD (gastroesophageal reflux disease)   . History of colon cancer   . History of recurrent UTIs   . Hyperlipidemia   . Hypertension   . Osteoarthritis   . Spinal compression fracture ([place])   . Vertigo     Patient Active Problem List   Diagnosis Date Noted  . Sepsis ([place]) 08/02/2020  . Lactic acidosis 08/02/2020  . Acute metabolic encephalopathy 93/81/8299  . Screening-pulmonary TB 07/24/2020  . Syncope 05/23/2020  . Sundowning 05/23/2020  . Dementia with behavioral disturbance ([place])   . Acute blood loss as cause of postoperative anemia 06/27/2019  . History of pelvic fracture 06/27/2019  . Closed displaced intertrochanteric fracture of left femur ([place]) 06/21/2019  . Anxiety with depression 06/21/2019  . Diarrhea 03/24/2019  . Shingles 01/23/2019  . Fracture, ulna, distal 12/06/2018  . Thrombocytopenia ([place]) 12/06/2018  . Dementia without behavioral disturbance ([place]) 11/22/2018  . Hand injury, left, initial encounter 09/01/2018  . H/O falling 05/03/2018  . Episodic confusion 05/03/2018  . Fall at home 03/29/2018  . Anxiety  disorder 03/29/2018  . Generalized weakness 03/29/2018  . Chronic pain 02/09/2018  . Cerumen impaction 12/06/2017  . H/O Clostridium difficile infection 10/13/2017  . Recurrent UTI (urinary tract infection) 09/06/2017  . Auditory hallucinations 03/03/2017  . Female cystocele 04/13/2016  . Bowel habit changes 12/30/2015  . Compression fracture 12/30/2015  . Routine general medical examination at a health care facility 04/09/2015  . [place]idal intertrigo 03/28/2014  . Bilateral hearing loss due to cerumen impaction 01/17/2014  . Encounter for Medicare annual wellness exam 03/21/2013  . Gout 09/19/2012  . Age related osteoporosis 05/05/2011  . Degenerative lumbar disc 01/20/2011  . VARICOSE VEINS, LOWER EXTREMITIES 09/04/2010  . Prediabetes 01/08/2010  . Hyperlipidemia 05/09/2008  . Essential hypertension 05/09/2008  . HEMORRHOIDS, INTERNAL 05/09/2008  . Allergic rhinitis 05/09/2008  . Mild reactive airways disease 05/09/2008  . GERD 05/09/2008  . IBS 05/09/2008  . Osteoarthritis 05/09/2008  . History of malignant neoplasm of large intestine 05/09/2008    Past Surgical History:  Procedure Laterality Date  . APPENDECTOMY    . CATARACT EXTRACTION    . COLON RESECTION    . INTRAMEDULLARY (IM) NAIL INTERTROCHANTERIC Left 06/22/2019   Procedure: INTRAMEDULLARY (IM) NAIL INTERTROCHANTRIC;  Surgeon: Rod Can, MD;  Location: [facility];  Service: Orthopedics;  Laterality: Left;  . TONSILLECTOMY       OB History   No obstetric history on file.     Family History  Problem Relation Age of Onset  . Prostate cancer Father   . Heart  attack Mother   . Gout Mother     Social History   Tobacco Use  . Smoking status: Never Smoker  . Smokeless tobacco: Never Used  Substance Use Topics  . Alcohol use: No    Alcohol/week: 0.0 standard drinks  . Drug use: No    Home Medications Prior to Admission medications   Medication Sig Start Date End Date Taking? Authorizing Provider   allopurinol (ZYLOPRIM) 100 MG tablet TAKE 2 TABLETS BY MOUTH  DAILY Patient taking differently: Take 100 mg by mouth daily.  01/03/20   Tower, [name], MD  Ascorbic Acid (VITAMIN C) 100 MG tablet Take 100 mg by mouth daily.    [provider]  aspirin 81 MG tablet Take 81 mg by mouth daily.      [provider]  calcium-vitamin D (CALCIUM 500 +D) 500 MG tablet Take 1 tablet by mouth daily.     [provider]  CRANBERRY PO Take 1 capsule by mouth 2 (two) times daily.    [provider]  memantine (NAMENDA) 5 MG tablet Take 1 tablet (5 mg total) by mouth 2 (two) times daily. 07/24/20   Tower, [name], MD  metoprolol tartrate (LOPRESSOR) 50 MG tablet TAKE 1 TABLET BY MOUTH  TWICE DAILY 10/08/20   Tower, [name], MD  mirtazapine (REMERON) 15 MG tablet Take 1 tablet (15 mg total) by mouth at bedtime. 07/13/19   Tower, [name], MD  multivitamin [facility]) per tablet Take 1 tablet by mouth daily.      [provider]  Probiotic Product (PROBIOTIC DAILY PO) Take 1 tablet by mouth daily.    [provider]  risperiDONE (RISPERDAL) 1 MG/ML oral solution Give 0.5 mg po in am and 1 mg in pm 09/05/20   Tower, [name], MD    Allergies    Amoxicillin-pot clavulanate, Keflex [cephalexin], and Septra [sulfamethoxazole-trimethoprim]  Review of Systems   Review of Systems  Unable to perform ROS: Dementia  Constitutional: Negative for fever.  Gastrointestinal: Negative for vomiting.  Skin: Negative for wound.  Neurological: Negative for headaches.    Physical Exam Updated Vital Signs BP (!) 144/88 (BP Location: Right Arm)   Pulse 75   Temp 97.9 F (36.6 C) (Oral)   Resp 20   Ht 5' (1.524 m)   Wt 46.3 kg   LMP 11/02/1980   SpO2 95%   BMI 19.92 kg/m   Physical Exam Vitals and nursing note reviewed.  Constitutional:      General: She is not in acute distress.    Appearance: Normal appearance.  HENT:     Head: Normocephalic and atraumatic.      Right Ear: Tympanic membrane normal.     Left Ear: Tympanic membrane normal.     Nose: Nose normal.  Eyes:     Conjunctiva/sclera: Conjunctivae normal.     Pupils: Pupils are equal, round, and reactive to light.  Cardiovascular:     Rate and Rhythm: Normal rate and regular rhythm.     Pulses: Normal pulses.     Heart sounds: Normal heart sounds.  Pulmonary:     Effort: Pulmonary effort is normal.     Breath sounds: Normal breath sounds.  Abdominal:     General: Abdomen is flat. Bowel sounds are normal.     Palpations: Abdomen is soft.     Tenderness: There is no abdominal tenderness. There is no guarding.  Musculoskeletal:        General: Normal  range of motion.     Cervical back: Normal range of motion and neck supple. No rigidity.  Skin:    General: Skin is warm and dry.     Capillary Refill: Capillary refill takes less than 2 seconds.  Neurological:     General: No focal deficit present.     Mental Status: She is alert and oriented to person, place, and time.     Deep Tendon Reflexes: Reflexes normal.  Psychiatric:        Mood and Affect: Mood normal.        Behavior: Behavior normal.     ED Results / Procedures / Treatments   Labs (all labs ordered are listed, but only abnormal results are displayed) Labs Reviewed - No data to display  EKG None  Radiology CT Head Wo Contrast  Result Date: 01/08/2021 CLINICAL DATA:  Unwitnessed fall, head injury, vertigo EXAM: CT HEAD WITHOUT CONTRAST CT CERVICAL SPINE WITHOUT CONTRAST TECHNIQUE: Multidetector CT imaging of the head and cervical spine was performed following the standard protocol without intravenous contrast. Multiplanar CT image reconstructions of the cervical spine were also generated. COMPARISON:  12/11/2020 FINDINGS: CT HEAD FINDINGS Brain: Normal anatomic configuration. Parenchymal volume loss is commensurate with the patient's age. Moderate periventricular white matter changes are present likely reflecting the  sequela of small vessel ischemia. No abnormal intra or extra-axial mass lesion or fluid collection. No abnormal mass effect or midline shift. No evidence of acute intracranial hemorrhage or infarct. Ventricular size is normal. Cerebellum unremarkable. Vascular: No asymmetric hyperdense vasculature at the skull base. Skull: Intact Sinuses/Orbits: Partial opacification of the left frontal sinus is seen, improved since prior examination. Remaining paranasal sinuses are clear. Orbits are unremarkable. Other: Mastoid air cells and middle ear cavities are clear. CT CERVICAL SPINE FINDINGS Alignment: There is overall straightening of the cervical spine. Mild reversal of the normal cervical lordosis at C5-6, likely degenerative in nature. No listhesis. Skull base and vertebrae: The craniocervical junction is unremarkable. The atlantodental interval is normal. Degenerative changes are noted at the Lantus dental articulation. There is no acute fracture of the cervical spine. Soft tissues and spinal canal: No prevertebral fluid or swelling. No visible canal hematoma. There is moderate central canal stenosis at C5-6 secondary to a posterior disc osteophyte complex with an AP diameter of the spinal canal of approximately 7 mm and mild flattening of the thecal sac noted. Disc levels: There is intervertebral disc space narrowing and endplate remodeling at J9-4 and C5-6 in keeping with moderate degenerative disc disease at these levels. Degenerative disc calcifications in milder degrees of narrowing are noted throughout the remainder of the cervical spine in keeping with changes of mild degenerative disc disease. There is multilevel uncovertebral and facet arthrosis resulting in multilevel neuroforaminal narrowing, severe bilaterally at C3-4, C4-5, right greater than left, and C5-6, left greater than right. Upper chest: Unremarkable Other: None IMPRESSION: None no acute intracranial abnormality.  No calvarial fracture. Improved  left frontal sinus opacification. No acute fracture or listhesis of the cervical spine. Multilevel degenerative disc and degenerative joint disease resulting in multilevel neuroforaminal narrowing and moderate central canal stenosis at C5-6. Electronically Signed   By: Fidela Salisbury MD   On: 01/08/2021 05:44   CT Cervical Spine Wo Contrast  Result Date: 01/08/2021 CLINICAL DATA:  Unwitnessed fall, head injury, vertigo EXAM: CT HEAD WITHOUT CONTRAST CT CERVICAL SPINE WITHOUT CONTRAST TECHNIQUE: Multidetector CT imaging of the head and cervical spine was performed following the standard protocol without  intravenous contrast. Multiplanar CT image reconstructions of the cervical spine were also generated. COMPARISON:  12/11/2020 FINDINGS: CT HEAD FINDINGS Brain: Normal anatomic configuration. Parenchymal volume loss is commensurate with the patient's age. Moderate periventricular white matter changes are present likely reflecting the sequela of small vessel ischemia. No abnormal intra or extra-axial mass lesion or fluid collection. No abnormal mass effect or midline shift. No evidence of acute intracranial hemorrhage or infarct. Ventricular size is normal. Cerebellum unremarkable. Vascular: No asymmetric hyperdense vasculature at the skull base. Skull: Intact Sinuses/Orbits: Partial opacification of the left frontal sinus is seen, improved since prior examination. Remaining paranasal sinuses are clear. Orbits are unremarkable. Other: Mastoid air cells and middle ear cavities are clear. CT CERVICAL SPINE FINDINGS Alignment: There is overall straightening of the cervical spine. Mild reversal of the normal cervical lordosis at C5-6, likely degenerative in nature. No listhesis. Skull base and vertebrae: The craniocervical junction is unremarkable. The atlantodental interval is normal. Degenerative changes are noted at the Lantus dental articulation. There is no acute fracture of the cervical spine. Soft tissues and  spinal canal: No prevertebral fluid or swelling. No visible canal hematoma. There is moderate central canal stenosis at C5-6 secondary to a posterior disc osteophyte complex with an AP diameter of the spinal canal of approximately 7 mm and mild flattening of the thecal sac noted. Disc levels: There is intervertebral disc space narrowing and endplate remodeling at E2-6 and C5-6 in keeping with moderate degenerative disc disease at these levels. Degenerative disc calcifications in milder degrees of narrowing are noted throughout the remainder of the cervical spine in keeping with changes of mild degenerative disc disease. There is multilevel uncovertebral and facet arthrosis resulting in multilevel neuroforaminal narrowing, severe bilaterally at C3-4, C4-5, right greater than left, and C5-6, left greater than right. Upper chest: Unremarkable Other: None IMPRESSION: None no acute intracranial abnormality.  No calvarial fracture. Improved left frontal sinus opacification. No acute fracture or listhesis of the cervical spine. Multilevel degenerative disc and degenerative joint disease resulting in multilevel neuroforaminal narrowing and moderate central canal stenosis at C5-6. Electronically Signed   By: Fidela Salisbury MD   On: 01/08/2021 05:44    Procedures Procedures   Medications Ordered in ED Medications - No data to display  ED Course  I have reviewed the triage vital signs and the nursing notes.  Pertinent labs & imaging results that were available during my care of the patient were reviewed by me and considered in my medical decision making (see chart for details).    No injuries from the fall.  Stable for discharge with close follow up.   Michelle Gregory was evaluated in Emergency Department on 01/08/2021 for the symptoms described in the history of present illness. She was evaluated in the context of the global COVID-19 pandemic, which necessitated consideration that the patient might be at risk for  infection with the SARS-CoV-2 virus that causes COVID-19. Institutional protocols and algorithms that pertain to the evaluation of patients at risk for COVID-19 are in a state of rapid change based on information released by regulatory bodies including the CDC and federal and state organizations. These policies and algorithms were followed during the patient's care in the ED.  Final Clinical Impression(s) / ED Diagnoses Final diagnoses:  Fall, initial encounter    Return for intractable cough, coughing up blood, fevers >100.4 unrelieved by medication, shortness of breath, intractable vomiting, chest pain, shortness of breath, weakness, numbness, changes in speech, facial asymmetry, abdominal pain,  passing out, Inability to tolerate liquids or food, cough, altered mental status or any concerns. No signs of systemic illness or infection. The patient is nontoxic-appearing on exam and vital signs are within normal limits.  I have reviewed the triage vital signs and the nursing notes. Pertinent labs & imaging results that were available during my care of the patient were reviewed by me and considered in my medical decision making (see chart for details). After history, exam, and medical workup I feel the patient has been appropriately medically screened and is safe for discharge home. Pertinent diagnoses were discussed with the patient. Patient was given return precautions.      Gurbani Figge, MD 01/08/21 [id number]

## 2021-01-08 NOTE — ED Notes (Signed)
[facility] called for pt transport. 

## 2021-01-08 NOTE — ED Triage Notes (Signed)
From [facility]. Unwitnessed fall. Pt states she hit her head. No evidence of injury or pain anywhere.

## 2021-01-08 NOTE — ED Notes (Addendum)
Dr. Randal Buba notified of pt HR.

## 2021-01-29 DIAGNOSIS — N3946 Mixed incontinence: Secondary | ICD-10-CM | POA: Diagnosis not present

## 2021-01-29 DIAGNOSIS — F0391 Unspecified dementia with behavioral disturbance: Secondary | ICD-10-CM | POA: Diagnosis not present

## 2021-02-03 ENCOUNTER — Encounter (HOSPITAL_BASED_OUTPATIENT_CLINIC_OR_DEPARTMENT_OTHER): Payer: Self-pay | Admitting: Emergency Medicine

## 2021-02-03 ENCOUNTER — Emergency Department (HOSPITAL_BASED_OUTPATIENT_CLINIC_OR_DEPARTMENT_OTHER): Payer: MEDICARE

## 2021-02-03 ENCOUNTER — Emergency Department (HOSPITAL_BASED_OUTPATIENT_CLINIC_OR_DEPARTMENT_OTHER)
Admission: EM | Admit: 2021-02-03 | Discharge: 2021-02-04 | Disposition: A | Discharge status: Home / Self Care | Payer: MEDICARE | Attending: Emergency Medicine | Admitting: Emergency Medicine

## 2021-02-03 DIAGNOSIS — Z7982 Long term (current) use of aspirin: Secondary | ICD-10-CM | POA: Diagnosis not present

## 2021-02-03 DIAGNOSIS — R69 Illness, unspecified: Secondary | ICD-10-CM | POA: Diagnosis not present

## 2021-02-03 DIAGNOSIS — W19XXXA Unspecified fall, initial encounter: Secondary | ICD-10-CM | POA: Insufficient documentation

## 2021-02-03 DIAGNOSIS — I10 Essential (primary) hypertension: Secondary | ICD-10-CM | POA: Insufficient documentation

## 2021-02-03 DIAGNOSIS — R000 Tachycardia, unspecified: Secondary | ICD-10-CM | POA: Insufficient documentation

## 2021-02-03 DIAGNOSIS — Z85038 Personal history of other malignant neoplasm of large intestine: Secondary | ICD-10-CM | POA: Diagnosis not present

## 2021-02-03 DIAGNOSIS — Y92129 Unspecified place in nursing home as the place of occurrence of the external cause: Secondary | ICD-10-CM | POA: Diagnosis not present

## 2021-02-03 DIAGNOSIS — R410 Disorientation, unspecified: Secondary | ICD-10-CM | POA: Diagnosis not present

## 2021-02-03 DIAGNOSIS — Z043 Encounter for examination and observation following other accident: Secondary | ICD-10-CM | POA: Diagnosis not present

## 2021-02-03 DIAGNOSIS — Z79899 Other long term (current) drug therapy: Secondary | ICD-10-CM | POA: Insufficient documentation

## 2021-02-03 DIAGNOSIS — F0391 Unspecified dementia with behavioral disturbance: Secondary | ICD-10-CM | POA: Insufficient documentation

## 2021-02-03 DIAGNOSIS — R5381 Other malaise: Secondary | ICD-10-CM | POA: Diagnosis not present

## 2021-02-03 DIAGNOSIS — Z743 Need for continuous supervision: Secondary | ICD-10-CM | POA: Diagnosis not present

## 2021-02-03 HISTORY — DX: Unspecified dementia, unspecified severity, without behavioral disturbance, psychotic disturbance, mood disturbance, and anxiety: F03.90

## 2021-02-03 MED ORDER — METOPROLOL TARTRATE 25 MG PO TABS
50.0000 mg | ORAL_TABLET | Freq: Once | ORAL | Status: AC
  Administered 2021-02-03: 50 mg via ORAL
  Filled 2021-02-03: qty 2

## 2021-02-03 NOTE — Discharge Instructions (Signed)
Follow up with primary doctor

## 2021-02-03 NOTE — ED Notes (Signed)
ED Provider at bedside. 

## 2021-02-03 NOTE — ED Provider Notes (Addendum)
[facility] EMERGENCY DEPT Provider Note   CSN: [id number] Arrival date & time: 02/03/21  1926     History Chief Complaint  Patient presents with  . Fall    Michelle Gregory is a 87 y.o. female.  Presents to ER with concern for fall.  Patient had a witnessed fall at nursing home.  Apparently slid to the ground and hit her bottom.  There was no witnessed head trauma, no loss of consciousness.  Staff wanted her checked out.  She is reportedly at her baseline mental status.  History of dementia.  HPI     Past Medical History:  Diagnosis Date  . Allergic rhinitis   . Anxiety   . C. difficile diarrhea 10/2017  . Cancer ([place])   . Dementia ([place])   . Depression   . GERD (gastroesophageal reflux disease)   . History of colon cancer   . History of recurrent UTIs   . Hyperlipidemia   . Hypertension   . Osteoarthritis   . Spinal compression fracture ([place])   . Vertigo     Patient Active Problem List   Diagnosis Date Noted  . Sepsis ([place]) 08/02/2020  . Lactic acidosis 08/02/2020  . Acute metabolic encephalopathy 93/79/0240  . Screening-pulmonary TB 07/24/2020  . Syncope 05/23/2020  . Sundowning 05/23/2020  . Dementia with behavioral disturbance ([place])   . Acute blood loss as cause of postoperative anemia 06/27/2019  . History of pelvic fracture 06/27/2019  . Closed displaced intertrochanteric fracture of left femur ([place]) 06/21/2019  . Anxiety with depression 06/21/2019  . Diarrhea 03/24/2019  . Shingles 01/23/2019  . Fracture, ulna, distal 12/06/2018  . Thrombocytopenia ([place]) 12/06/2018  . Dementia without behavioral disturbance ([place]) 11/22/2018  . Hand injury, left, initial encounter 09/01/2018  . H/O falling 05/03/2018  . Episodic confusion 05/03/2018  . Fall at home 03/29/2018  . Anxiety disorder 03/29/2018  . Generalized weakness 03/29/2018  . Chronic pain 02/09/2018  . Cerumen impaction 12/06/2017  . H/O Clostridium difficile infection 10/13/2017  .  Recurrent UTI (urinary tract infection) 09/06/2017  . Auditory hallucinations 03/03/2017  . Female cystocele 04/13/2016  . Bowel habit changes 12/30/2015  . Compression fracture 12/30/2015  . Routine general medical examination at a health care facility 04/09/2015  . Candidal intertrigo 03/28/2014  . Bilateral hearing loss due to cerumen impaction 01/17/2014  . Encounter for Medicare annual wellness exam 03/21/2013  . Gout 09/19/2012  . Age related osteoporosis 05/05/2011  . Degenerative lumbar disc 01/20/2011  . VARICOSE VEINS, LOWER EXTREMITIES 09/04/2010  . Prediabetes 01/08/2010  . Hyperlipidemia 05/09/2008  . Essential hypertension 05/09/2008  . HEMORRHOIDS, INTERNAL 05/09/2008  . Allergic rhinitis 05/09/2008  . Mild reactive airways disease 05/09/2008  . GERD 05/09/2008  . IBS 05/09/2008  . Osteoarthritis 05/09/2008  . History of malignant neoplasm of large intestine 05/09/2008    Past Surgical History:  Procedure Laterality Date  . APPENDECTOMY    . CATARACT EXTRACTION    . COLON RESECTION    . INTRAMEDULLARY (IM) NAIL INTERTROCHANTERIC Left 06/22/2019   Procedure: INTRAMEDULLARY (IM) NAIL INTERTROCHANTRIC;  Surgeon: Rod Can, MD;  Location: [facility];  Service: Orthopedics;  Laterality: Left;  . TONSILLECTOMY       OB History   No obstetric history on file.     Family History  Problem Relation Age of Onset  . Prostate cancer Father   . Heart attack Mother   . Gout Mother     Social History   Tobacco Use  .  Smoking status: Never Smoker  . Smokeless tobacco: Never Used  Substance Use Topics  . Alcohol use: No    Alcohol/week: 0.0 standard drinks  . Drug use: No    Home Medications Prior to Admission medications   Medication Sig Start Date End Date Taking? Authorizing Provider  allopurinol (ZYLOPRIM) 100 MG tablet TAKE 2 TABLETS BY MOUTH  DAILY Patient taking differently: Take 100 mg by mouth daily.  01/03/20   Tower, [name], MD  Ascorbic Acid  (VITAMIN C) 100 MG tablet Take 100 mg by mouth daily.    [provider]  aspirin 81 MG tablet Take 81 mg by mouth daily.      [provider]  calcium-vitamin D (CALCIUM 500 +D) 500 MG tablet Take 1 tablet by mouth daily.     [provider]  CRANBERRY PO Take 1 capsule by mouth 2 (two) times daily.    [provider]  memantine (NAMENDA) 5 MG tablet Take 1 tablet (5 mg total) by mouth 2 (two) times daily. 07/24/20   Tower, [name], MD  metoprolol tartrate (LOPRESSOR) 50 MG tablet TAKE 1 TABLET BY MOUTH  TWICE DAILY 10/08/20   Tower, [name], MD  mirtazapine (REMERON) 15 MG tablet Take 1 tablet (15 mg total) by mouth at bedtime. 07/13/19   Tower, [name], MD  multivitamin [facility]) per tablet Take 1 tablet by mouth daily.      [provider]  Probiotic Product (PROBIOTIC DAILY PO) Take 1 tablet by mouth daily.    [provider]  risperiDONE (RISPERDAL) 1 MG/ML oral solution Give 0.5 mg po in am and 1 mg in pm 09/05/20   Tower, [name], MD    Allergies    Amoxicillin-pot clavulanate, Keflex [cephalexin], and Septra [sulfamethoxazole-trimethoprim]  Review of Systems   Review of Systems  Unable to perform ROS: Dementia    Physical Exam Updated Vital Signs BP (!) 149/100   Pulse (!) 101   Temp 98.2 F (36.8 C) (Temporal)   Resp (!) 22   Wt 47.7 kg   LMP 11/02/1980   SpO2 95%   BMI 20.55 kg/m   Physical Exam Vitals and nursing note reviewed.  Constitutional:      General: She is not in acute distress.    Appearance: She is well-developed.  HENT:     Head: Normocephalic and atraumatic.  Eyes:     Conjunctiva/sclera: Conjunctivae normal.  Cardiovascular:     Rate and Rhythm: Regular rhythm. Tachycardia present.     Heart sounds: No murmur heard.   Pulmonary:     Effort: Pulmonary effort is normal. No respiratory distress.     Breath sounds: Normal breath sounds.  Abdominal:     Palpations: Abdomen is soft.      Tenderness: There is no abdominal tenderness.  Musculoskeletal:        General: No deformity or signs of injury.     Cervical back: Neck supple.     Comments: Back: no C, T, L spine TTP, no step off or deformity RUE: no TTP throughout, no deformity, normal joint ROM, radial pulse intact, distal sensation and motor intact LUE: no TTP throughout, no deformity, normal joint ROM, radial pulse intact, distal sensation and motor intact RLE:  no TTP throughout, no deformity, normal joint ROM, distal pulse, sensation and motor intact LLE: no TTP throughout, no deformity, normal joint ROM, distal pulse, sensation and motor intact  Skin:    General: Skin is warm  and dry.  Neurological:     General: No focal deficit present.     Mental Status: She is alert.     Comments: Pleasantly confused     ED Results / Procedures / Treatments   Labs (all labs ordered are listed, but only abnormal results are displayed) Labs Reviewed - No data to display  EKG EKG Interpretation  Date/Time:  Monday February 03 2021 19:56:57 EDT Ventricular Rate:  116 PR Interval:  130 QRS Duration: 78 QT Interval:  308 QTC Calculation: 428 R Axis:   69 Text Interpretation: Sinus tachycardia with irregular rate Nonspecific T abnrm, anterolateral leads Confirmed by Madalyn Rob [phone]) on 02/03/2021 8:07:37 PM   Radiology DG Pelvis Portable  Result Date: 02/03/2021 CLINICAL DATA:  Fall EXAM: PORTABLE PELVIS 1-2 VIEWS COMPARISON:  None. FINDINGS: Hardware seen within the proximal left femur related to remote injury. No acute fracture, subluxation or dislocation. Mild degenerative changes within the hips, symmetric. IMPRESSION: No acute bony abnormality. Electronically Signed   By: Rolm Baptise M.D.   On: 02/03/2021 20:23   DG Chest Portable 1 View  Result Date: 02/03/2021 CLINICAL DATA:  Fall EXAM: PORTABLE CHEST 1 VIEW COMPARISON:  12/11/2020 FINDINGS: Chronic changes in the lungs with chronic peribronchial thickening  and areas of scarring. Heart is normal size. No acute confluent opacities or effusions. No acute bony abnormality. No pneumothorax. Old right rib fractures noted. IMPRESSION: Chronic changes.  No active disease. Electronically Signed   By: Rolm Baptise M.D.   On: 02/03/2021 20:22    Procedures Procedures   Medications Ordered in ED Medications  metoprolol tartrate (LOPRESSOR) tablet 50 mg (50 mg Oral Given 02/03/21 1951)    ED Course  I have reviewed the triage vital signs and the nursing notes.  Pertinent labs & imaging results that were available during my care of the patient were reviewed by me and considered in my medical decision making (see chart for details).    MDM Rules/Calculators/A&P                          87 year old lady presenting to ER from nursing home after witnessed fall.  No reported loss of consciousness, no reported head injury.  On my exam, patient is pleasantly demented.  Her vital signs were stable except for mild tachycardia.  She was provided her home evening metoprolol dose and her tachycardia resolved.  She remained well-appearing throughout her ER stay.  Screening chest and pelvis x-rays were negative.  Believe she is stable to go back to her facility.    After the discussed management above, the patient was determined to be safe for discharge.  The patient was in agreement with this plan and all questions regarding their care were answered.  ED return precautions were discussed and the patient will return to the ED with any significant worsening of condition.   Final Clinical Impression(s) / ED Diagnoses Final diagnoses:  Fall, initial encounter    Rx / DC Orders ED Discharge Orders    None       Lucrezia Starch, MD 02/03/21 2158    Lucrezia Starch, MD 02/03/21 2159

## 2021-02-03 NOTE — ED Notes (Signed)
Report called to [place] at [facility].

## 2021-02-03 NOTE — ED Notes (Signed)
Spoke with [facility] dispatch , at this time 2 calls before will be able to transport back to nursing home

## 2021-02-03 NOTE — ED Triage Notes (Signed)
Arrived via [facility], from [place], with witnessed fall. No LOC or obvious injury, staff just wanted her checked out. Hx of Dementia, confused per norm. BP 148/80, HR 94, R 22, O2 Sat 97%RA, T 97.8

## 2021-02-04 DIAGNOSIS — R6889 Other general symptoms and signs: Secondary | ICD-10-CM | POA: Diagnosis not present

## 2021-02-04 DIAGNOSIS — Z743 Need for continuous supervision: Secondary | ICD-10-CM | POA: Diagnosis not present

## 2021-02-04 DIAGNOSIS — R279 Unspecified lack of coordination: Secondary | ICD-10-CM | POA: Diagnosis not present

## 2021-02-04 NOTE — ED Notes (Signed)
Patient adult brief changed at this time.

## 2021-02-05 DIAGNOSIS — M6281 Muscle weakness (generalized): Secondary | ICD-10-CM | POA: Diagnosis not present

## 2021-02-05 DIAGNOSIS — N390 Urinary tract infection, site not specified: Secondary | ICD-10-CM | POA: Diagnosis not present

## 2021-02-05 DIAGNOSIS — F0391 Unspecified dementia with behavioral disturbance: Secondary | ICD-10-CM | POA: Diagnosis not present

## 2021-02-05 DIAGNOSIS — W19XXXA Unspecified fall, initial encounter: Secondary | ICD-10-CM | POA: Diagnosis not present

## 2021-02-11 DIAGNOSIS — Z9181 History of falling: Secondary | ICD-10-CM | POA: Diagnosis not present

## 2021-02-11 DIAGNOSIS — G309 Alzheimer's disease, unspecified: Secondary | ICD-10-CM | POA: Diagnosis not present

## 2021-02-11 DIAGNOSIS — F0281 Dementia in other diseases classified elsewhere with behavioral disturbance: Secondary | ICD-10-CM | POA: Diagnosis not present

## 2021-02-11 DIAGNOSIS — F419 Anxiety disorder, unspecified: Secondary | ICD-10-CM | POA: Diagnosis not present

## 2021-02-11 DIAGNOSIS — M1990 Unspecified osteoarthritis, unspecified site: Secondary | ICD-10-CM | POA: Diagnosis not present

## 2021-02-17 DIAGNOSIS — Z9181 History of falling: Secondary | ICD-10-CM | POA: Diagnosis not present

## 2021-02-17 DIAGNOSIS — G309 Alzheimer's disease, unspecified: Secondary | ICD-10-CM | POA: Diagnosis not present

## 2021-02-17 DIAGNOSIS — F0281 Dementia in other diseases classified elsewhere with behavioral disturbance: Secondary | ICD-10-CM | POA: Diagnosis not present

## 2021-02-17 DIAGNOSIS — M1990 Unspecified osteoarthritis, unspecified site: Secondary | ICD-10-CM | POA: Diagnosis not present

## 2021-02-17 DIAGNOSIS — F419 Anxiety disorder, unspecified: Secondary | ICD-10-CM | POA: Diagnosis not present

## 2021-02-18 DIAGNOSIS — Z9181 History of falling: Secondary | ICD-10-CM | POA: Diagnosis not present

## 2021-02-18 DIAGNOSIS — F0281 Dementia in other diseases classified elsewhere with behavioral disturbance: Secondary | ICD-10-CM | POA: Diagnosis not present

## 2021-02-18 DIAGNOSIS — M1990 Unspecified osteoarthritis, unspecified site: Secondary | ICD-10-CM | POA: Diagnosis not present

## 2021-02-18 DIAGNOSIS — F419 Anxiety disorder, unspecified: Secondary | ICD-10-CM | POA: Diagnosis not present

## 2021-02-18 DIAGNOSIS — G309 Alzheimer's disease, unspecified: Secondary | ICD-10-CM | POA: Diagnosis not present

## 2021-02-19 ENCOUNTER — Other Ambulatory Visit: Payer: Self-pay | Admitting: Family Medicine

## 2021-02-19 DIAGNOSIS — F0391 Unspecified dementia with behavioral disturbance: Secondary | ICD-10-CM | POA: Diagnosis not present

## 2021-02-19 DIAGNOSIS — A0811 Acute gastroenteropathy due to Norwalk agent: Secondary | ICD-10-CM | POA: Diagnosis not present

## 2021-02-27 DIAGNOSIS — F0281 Dementia in other diseases classified elsewhere with behavioral disturbance: Secondary | ICD-10-CM | POA: Diagnosis not present

## 2021-02-27 DIAGNOSIS — M1990 Unspecified osteoarthritis, unspecified site: Secondary | ICD-10-CM | POA: Diagnosis not present

## 2021-02-27 DIAGNOSIS — F419 Anxiety disorder, unspecified: Secondary | ICD-10-CM | POA: Diagnosis not present

## 2021-02-27 DIAGNOSIS — Z9181 History of falling: Secondary | ICD-10-CM | POA: Diagnosis not present

## 2021-02-27 DIAGNOSIS — G309 Alzheimer's disease, unspecified: Secondary | ICD-10-CM | POA: Diagnosis not present

## 2021-03-04 DIAGNOSIS — F0281 Dementia in other diseases classified elsewhere with behavioral disturbance: Secondary | ICD-10-CM | POA: Diagnosis not present

## 2021-03-04 DIAGNOSIS — G309 Alzheimer's disease, unspecified: Secondary | ICD-10-CM | POA: Diagnosis not present

## 2021-03-04 DIAGNOSIS — M1990 Unspecified osteoarthritis, unspecified site: Secondary | ICD-10-CM | POA: Diagnosis not present

## 2021-03-04 DIAGNOSIS — Z9181 History of falling: Secondary | ICD-10-CM | POA: Diagnosis not present

## 2021-03-04 DIAGNOSIS — F419 Anxiety disorder, unspecified: Secondary | ICD-10-CM | POA: Diagnosis not present

## 2021-03-05 DIAGNOSIS — Z87440 Personal history of urinary (tract) infections: Secondary | ICD-10-CM | POA: Diagnosis not present

## 2021-03-05 DIAGNOSIS — N3946 Mixed incontinence: Secondary | ICD-10-CM | POA: Diagnosis not present

## 2021-03-10 DIAGNOSIS — Z9181 History of falling: Secondary | ICD-10-CM | POA: Diagnosis not present

## 2021-03-10 DIAGNOSIS — G309 Alzheimer's disease, unspecified: Secondary | ICD-10-CM | POA: Diagnosis not present

## 2021-03-10 DIAGNOSIS — F0281 Dementia in other diseases classified elsewhere with behavioral disturbance: Secondary | ICD-10-CM | POA: Diagnosis not present

## 2021-03-10 DIAGNOSIS — F419 Anxiety disorder, unspecified: Secondary | ICD-10-CM | POA: Diagnosis not present

## 2021-03-10 DIAGNOSIS — M1990 Unspecified osteoarthritis, unspecified site: Secondary | ICD-10-CM | POA: Diagnosis not present

## 2021-03-11 ENCOUNTER — Emergency Department (HOSPITAL_COMMUNITY)
Admission: EM | Admit: 2021-03-11 | Discharge: 2021-03-12 | Disposition: A | Discharge status: Home / Self Care | Payer: MEDICARE | Attending: Emergency Medicine | Admitting: Emergency Medicine

## 2021-03-11 ENCOUNTER — Emergency Department (HOSPITAL_COMMUNITY): Payer: MEDICARE

## 2021-03-11 ENCOUNTER — Encounter (HOSPITAL_COMMUNITY): Payer: Self-pay | Admitting: Emergency Medicine

## 2021-03-11 DIAGNOSIS — I10 Essential (primary) hypertension: Secondary | ICD-10-CM | POA: Diagnosis not present

## 2021-03-11 DIAGNOSIS — Z743 Need for continuous supervision: Secondary | ICD-10-CM | POA: Diagnosis not present

## 2021-03-11 DIAGNOSIS — R0689 Other abnormalities of breathing: Secondary | ICD-10-CM | POA: Diagnosis not present

## 2021-03-11 DIAGNOSIS — Z79899 Other long term (current) drug therapy: Secondary | ICD-10-CM | POA: Insufficient documentation

## 2021-03-11 DIAGNOSIS — F0390 Unspecified dementia without behavioral disturbance: Secondary | ICD-10-CM | POA: Insufficient documentation

## 2021-03-11 DIAGNOSIS — I499 Cardiac arrhythmia, unspecified: Secondary | ICD-10-CM | POA: Diagnosis not present

## 2021-03-11 DIAGNOSIS — N390 Urinary tract infection, site not specified: Secondary | ICD-10-CM | POA: Insufficient documentation

## 2021-03-11 DIAGNOSIS — I517 Cardiomegaly: Secondary | ICD-10-CM | POA: Diagnosis not present

## 2021-03-11 DIAGNOSIS — U071 COVID-19: Secondary | ICD-10-CM | POA: Diagnosis not present

## 2021-03-11 DIAGNOSIS — R000 Tachycardia, unspecified: Secondary | ICD-10-CM | POA: Diagnosis not present

## 2021-03-11 DIAGNOSIS — R21 Rash and other nonspecific skin eruption: Secondary | ICD-10-CM | POA: Diagnosis not present

## 2021-03-11 DIAGNOSIS — B9689 Other specified bacterial agents as the cause of diseases classified elsewhere: Secondary | ICD-10-CM | POA: Diagnosis not present

## 2021-03-11 DIAGNOSIS — Z85038 Personal history of other malignant neoplasm of large intestine: Secondary | ICD-10-CM | POA: Insufficient documentation

## 2021-03-11 DIAGNOSIS — R4182 Altered mental status, unspecified: Secondary | ICD-10-CM | POA: Diagnosis present

## 2021-03-11 DIAGNOSIS — S2231XA Fracture of one rib, right side, initial encounter for closed fracture: Secondary | ICD-10-CM | POA: Diagnosis not present

## 2021-03-11 DIAGNOSIS — R6889 Other general symptoms and signs: Secondary | ICD-10-CM | POA: Diagnosis not present

## 2021-03-11 DIAGNOSIS — R531 Weakness: Secondary | ICD-10-CM | POA: Diagnosis not present

## 2021-03-11 DIAGNOSIS — R059 Cough, unspecified: Secondary | ICD-10-CM | POA: Diagnosis not present

## 2021-03-11 DIAGNOSIS — Z7982 Long term (current) use of aspirin: Secondary | ICD-10-CM | POA: Diagnosis not present

## 2021-03-11 DIAGNOSIS — J45909 Unspecified asthma, uncomplicated: Secondary | ICD-10-CM | POA: Insufficient documentation

## 2021-03-11 LAB — URINALYSIS, ROUTINE W REFLEX MICROSCOPIC
Bilirubin Urine: NEGATIVE
Glucose, UA: NEGATIVE mg/dL
Ketones, ur: NEGATIVE mg/dL
Leukocytes,Ua: NEGATIVE
Nitrite: POSITIVE — AB
Protein, ur: NEGATIVE mg/dL
Specific Gravity, Urine: 1.019 (ref 1.005–1.030)
pH: 5 (ref 5.0–8.0)

## 2021-03-11 LAB — COMPREHENSIVE METABOLIC PANEL
ALT: 23 U/L (ref 0–44)
AST: 26 U/L (ref 15–41)
Albumin: 3.7 g/dL (ref 3.5–5.0)
Alkaline Phosphatase: 85 U/L (ref 38–126)
Anion gap: 6 (ref 5–15)
BUN: 21 mg/dL (ref 8–23)
CO2: 31 mmol/L (ref 22–32)
Calcium: 8.7 mg/dL — ABNORMAL LOW (ref 8.9–10.3)
Chloride: 101 mmol/L (ref 98–111)
Creatinine, Ser: 0.57 mg/dL (ref 0.44–1.00)
GFR, Estimated: >60 mL/min (ref >60)
Glucose, Bld: 103 mg/dL — ABNORMAL HIGH (ref 70–99)
Potassium: 3.9 mmol/L (ref 3.5–5.1)
Sodium: 138 mmol/L (ref 135–145)
Total Bilirubin: 0.5 mg/dL (ref 0.3–1.2)
Total Protein: 6.3 g/dL — ABNORMAL LOW (ref 6.5–8.1)

## 2021-03-11 LAB — CBC WITH DIFFERENTIAL/PLATELET
Abs Immature Granulocytes: 0.01 10*3/uL (ref 0.00–0.07)
Basophils Absolute: 0 10*3/uL (ref 0.0–0.1)
Basophils Relative: 0 %
Eosinophils Absolute: 0.1 10*3/uL (ref 0.0–0.5)
Eosinophils Relative: 1 %
HCT: 45.2 % (ref 36.0–46.0)
Hemoglobin: 14.3 g/dL (ref 12.0–15.0)
Immature Granulocytes: 0 %
Lymphocytes Relative: 14 %
Lymphs Abs: 0.8 10*3/uL (ref 0.7–4.0)
MCH: 31.6 pg (ref 26.0–34.0)
MCHC: 31.6 g/dL (ref 30.0–36.0)
MCV: 99.8 fL (ref 80.0–100.0)
Monocytes Absolute: 0.7 10*3/uL (ref 0.1–1.0)
Monocytes Relative: 12 %
Neutro Abs: 3.9 10*3/uL (ref 1.7–7.7)
Neutrophils Relative %: 73 %
Platelets: 126 10*3/uL — ABNORMAL LOW (ref 150–400)
RBC: 4.53 MIL/uL (ref 3.87–5.11)
RDW: 12.9 % (ref 11.5–15.5)
WBC: 5.4 10*3/uL (ref 4.0–10.5)
nRBC: 0 % (ref 0.0–0.2)

## 2021-03-11 LAB — LACTIC ACID, PLASMA: Lactic Acid, Venous: 1.6 mmol/L (ref 0.5–1.9)

## 2021-03-11 LAB — RESP PANEL BY RT-PCR (FLU A&B, COVID) ARPGX2
Influenza A by PCR: NEGATIVE
Influenza B by PCR: NEGATIVE
SARS Coronavirus 2 by RT PCR: POSITIVE — AB

## 2021-03-11 MED ORDER — CEPHALEXIN 500 MG PO CAPS: 500.0000 mg | ORAL_CAPSULE | Freq: Four times a day (QID) | ORAL | 0 refills | Status: AC

## 2021-03-11 MED ORDER — SODIUM CHLORIDE 0.9 % IV SOLN
1.0000 g | Freq: Once | INTRAVENOUS | Status: AC
  Administered 2021-03-11: 1 g via INTRAVENOUS
  Filled 2021-03-11: qty 10

## 2021-03-11 NOTE — ED Provider Notes (Addendum)
[facility] DEPT Provider Note   CSN: [id number] Arrival date & time: 03/11/21  1922     History Chief Complaint  Patient presents with  . Altered Mental Status    Michelle Gregory is a 87 y.o. female.  87 year old female with prior medical history as detailed below presents for evaluation.  Patient with valid DNR.  Patient arrives from her facility with concern for possible UTI.  Level 5 caveat.  Patient with advanced dementia.  She is unable to provide significant history.  She appears comfortable.  She denies pain.  The history is provided by the patient, medical records and the EMS personnel.  Altered Mental Status Presenting symptoms: behavior changes   Severity:  Unable to specify Episode history:  Unable to specify Timing:  Unable to specify Progression:  Unable to specify      Past Medical History:  Diagnosis Date  . Allergic rhinitis   . Anxiety   . C. difficile diarrhea 10/2017  . Cancer ([place])   . Dementia ([place])   . Depression   . GERD (gastroesophageal reflux disease)   . History of colon cancer   . History of recurrent UTIs   . Hyperlipidemia   . Hypertension   . Osteoarthritis   . Spinal compression fracture ([place])   . Vertigo     Patient Active Problem List   Diagnosis Date Noted  . Sepsis ([place]) 08/02/2020  . Lactic acidosis 08/02/2020  . Acute metabolic encephalopathy [id number]  . Screening-pulmonary TB 07/24/2020  . Syncope 05/23/2020  . Sundowning 05/23/2020  . Dementia with behavioral disturbance ([place])   . Acute blood loss as cause of postoperative anemia 06/27/2019  . History of pelvic fracture 06/27/2019  . Closed displaced intertrochanteric fracture of left femur ([place]) 06/21/2019  . Anxiety with depression 06/21/2019  . Diarrhea 03/24/2019  . Shingles 01/23/2019  . Fracture, ulna, distal 12/06/2018  . Thrombocytopenia ([place]) 12/06/2018  . Dementia without behavioral disturbance ([place]) 11/22/2018  . Hand  injury, left, initial encounter 09/01/2018  . H/O falling 05/03/2018  . Episodic confusion 05/03/2018  . Fall at home 03/29/2018  . Anxiety disorder 03/29/2018  . Generalized weakness 03/29/2018  . Chronic pain 02/09/2018  . Cerumen impaction 12/06/2017  . H/O Clostridium difficile infection 10/13/2017  . Recurrent UTI (urinary tract infection) 09/06/2017  . Auditory hallucinations 03/03/2017  . Female cystocele 04/13/2016  . Bowel habit changes 12/30/2015  . Compression fracture 12/30/2015  . Routine general medical examination at a health care facility 04/09/2015  . Candidal intertrigo 03/28/2014  . Bilateral hearing loss due to cerumen impaction 01/17/2014  . Encounter for Medicare annual wellness exam 03/21/2013  . Gout 09/19/2012  . Age related osteoporosis 05/05/2011  . Degenerative lumbar disc 01/20/2011  . VARICOSE VEINS, LOWER EXTREMITIES 09/04/2010  . Prediabetes 01/08/2010  . Hyperlipidemia 05/09/2008  . Essential hypertension 05/09/2008  . HEMORRHOIDS, INTERNAL 05/09/2008  . Allergic rhinitis 05/09/2008  . Mild reactive airways disease 05/09/2008  . GERD 05/09/2008  . IBS 05/09/2008  . Osteoarthritis 05/09/2008  . History of malignant neoplasm of large intestine 05/09/2008    Past Surgical History:  Procedure Laterality Date  . APPENDECTOMY    . CATARACT EXTRACTION    . COLON RESECTION    . INTRAMEDULLARY (IM) NAIL INTERTROCHANTERIC Left 06/22/2019   Procedure: INTRAMEDULLARY (IM) NAIL INTERTROCHANTRIC;  Surgeon: Rod Can, MD;  Location: [facility];  Service: Orthopedics;  Laterality: Left;  . TONSILLECTOMY       OB History  No obstetric history on file.     Family History  Problem Relation Age of Onset  . Prostate cancer Father   . Heart attack Mother   . Gout Mother     Social History   Tobacco Use  . Smoking status: Never Smoker  . Smokeless tobacco: Never Used  Substance Use Topics  . Alcohol use: No    Alcohol/week: 0.0 standard  drinks  . Drug use: No    Home Medications Prior to Admission medications   Medication Sig Start Date End Date Taking? Authorizing Provider  allopurinol (ZYLOPRIM) 100 MG tablet TAKE 2 TABLETS BY MOUTH  DAILY Patient taking differently: Take 100 mg by mouth daily.  01/03/20   Tower, [name], MD  Ascorbic Acid (VITAMIN C) 100 MG tablet Take 100 mg by mouth daily.    [provider]  aspirin 81 MG tablet Take 81 mg by mouth daily.      [provider]  calcium-vitamin D (CALCIUM 500 +D) 500 MG tablet Take 1 tablet by mouth daily.     [provider]  CRANBERRY PO Take 1 capsule by mouth 2 (two) times daily.    [provider]  memantine (NAMENDA) 5 MG tablet Take 1 tablet (5 mg total) by mouth 2 (two) times daily. 07/24/20   Tower, [name], MD  metoprolol tartrate (LOPRESSOR) 50 MG tablet TAKE 1 TABLET BY MOUTH  TWICE DAILY 10/08/20   Tower, [name], MD  mirtazapine (REMERON) 15 MG tablet Take 1 tablet (15 mg total) by mouth at bedtime. 07/13/19   Tower, [name], MD  multivitamin [facility]) per tablet Take 1 tablet by mouth daily.      [provider]  Probiotic Product (PROBIOTIC DAILY PO) Take 1 tablet by mouth daily.    [provider]  risperiDONE (RISPERDAL) 1 MG/ML oral solution Give 0.5 mg po in am and 1 mg in pm 09/05/20   Tower, [name], MD    Allergies    Amoxicillin-pot clavulanate, Keflex [cephalexin], and Septra [sulfamethoxazole-trimethoprim]  Review of Systems   Review of Systems  All other systems reviewed and are negative.   Physical Exam Updated Vital Signs LMP 11/02/1980   Physical Exam Vitals and nursing note reviewed.  Constitutional:      General: She is not in acute distress.    Appearance: Normal appearance. She is well-developed.  HENT:     Head: Normocephalic and atraumatic.  Eyes:     Conjunctiva/sclera: Conjunctivae normal.     Pupils: Pupils are equal, round, and reactive to light.  Cardiovascular:      Rate and Rhythm: Normal rate and regular rhythm.     Heart sounds: Normal heart sounds.  Pulmonary:     Effort: Pulmonary effort is normal. No respiratory distress.     Breath sounds: Normal breath sounds.  Abdominal:     General: There is no distension.     Palpations: Abdomen is soft.     Tenderness: There is no abdominal tenderness.  Musculoskeletal:        General: No deformity. Normal range of motion.     Cervical back: Normal range of motion and neck supple.  Skin:    General: Skin is warm and dry.  Neurological:     General: No focal deficit present.     Mental Status: She is alert. Mental status is at baseline.     ED Results / Procedures / Treatments   Labs (all labs ordered are  listed, but only abnormal results are displayed) Labs Reviewed  RESP PANEL BY RT-PCR (FLU A&B, COVID) ARPGX2 - Abnormal; Notable for the following components:      Result Value   SARS Coronavirus 2 by RT PCR POSITIVE (*)    All other components within normal limits  URINALYSIS, ROUTINE W REFLEX MICROSCOPIC - Abnormal; Notable for the following components:   Hgb urine dipstick SMALL (*)    Nitrite POSITIVE (*)    Bacteria, UA MANY (*)    All other components within normal limits  COMPREHENSIVE METABOLIC PANEL - Abnormal; Notable for the following components:   Glucose, Bld 103 (*)    Calcium 8.7 (*)    Total Protein 6.3 (*)    All other components within normal limits  CBC WITH DIFFERENTIAL/PLATELET - Abnormal; Notable for the following components:   Platelets 126 (*)    All other components within normal limits  CULTURE, BLOOD (ROUTINE X 2)  CULTURE, BLOOD (ROUTINE X 2)  URINE CULTURE  LACTIC ACID, PLASMA    EKG EKG Interpretation  Date/Time:  Tuesday Mar 11 2021 20:33:05 EDT Ventricular Rate:  102 PR Interval:  156 QRS Duration: 80 QT Interval:  343 QTC Calculation: 447 R Axis:   65 Text Interpretation: Sinus tachycardia with irregular rate Low voltage, extremity leads  Probable anteroseptal infarct, old Nonspecific T abnormalities, lateral leads Confirmed by Dene Gentry [id number]) on 03/11/2021 8:36:58 PM   Radiology DG Chest Port 1 View  Result Date: 03/11/2021 CLINICAL DATA:  87 year old female with cough. EXAM: PORTABLE CHEST 1 VIEW COMPARISON:  Chest radiograph dated 02/03/2021. FINDINGS: There is cardiomegaly with central vascular congestion. No focal consolidation, pleural effusion, or pneumothorax. Atherosclerotic calcification of the aorta. Old right rib fractures. No acute osseous pathology. IMPRESSION: Cardiomegaly with central vascular congestion. No focal consolidation. Electronically Signed   By: Anner Crete M.D.   On: 03/11/2021 20:53    Procedures Procedures   Medications Ordered in ED Medications - No data to display  ED Course  I have reviewed the triage vital signs and the nursing notes.  Pertinent labs & imaging results that were available during my care of the patient were reviewed by me and considered in my medical decision making (see chart for details).    MDM Rules/Calculators/A&P                          MDM  MSE complete  JAILYN LEESON was evaluated in Emergency Department on 03/11/2021 for the symptoms described in the history of present illness. She was evaluated in the context of the global COVID-19 pandemic, which necessitated consideration that the patient might be at risk for infection with the SARS-CoV-2 virus that causes COVID-19. Institutional protocols and algorithms that pertain to the evaluation of patients at risk for COVID-19 are in a state of rapid change based on information released by regulatory bodies including the CDC and federal and state organizations. These policies and algorithms were followed during the patient's care in the ED.  Patient presented for evaluation of possible UTI.  Screening labs reveal likely UTI.    Patient with history of frequent UTIs in the past.  Patient with advanced  dementia.  She has had valid DNR/DNI.  Case discussed with the patient's daughter [name].  Patient was offered admission for overnight observation.  Patient's daughter would prefer the patient be discharged back to her facility.  Will prescribe Keflex for outpatient use.  Patient has already  received 1 dose of Rocephin here in the ED for treatment of likely UTI.  Daughter is aware of positive Covid test - patient apparently tested positive one month prior. Patient without evidence of acute covid infection.     Final Clinical Impression(s) / ED Diagnoses Final diagnoses:  Urinary tract infection without hematuria, site unspecified    Rx / DC Orders ED Discharge Orders    None       Valarie Merino, MD 03/11/21 2319    Valarie Merino, MD 03/11/21 [phone]

## 2021-03-11 NOTE — ED Triage Notes (Signed)
Pt BIB EMS from [facility] facility. Per facility staff, after shift change pt was found to be more altered than normal. Staff unsure how long pt has been like this. Also concerned for possible UTI due to foul smelling urine. Dementia at baseline. Cough present. VSS.

## 2021-03-11 NOTE — Discharge Instructions (Addendum)
Return for any problem.  ° °Take Keflex as prescribed for treatment of UTI.  °

## 2021-03-11 NOTE — ED Notes (Signed)
Pt cleaned up by RN and tech. Pt had bowel movement with 2 episodes of urination during cleaning. Pt cleaned up and in bed comfortably at this time.

## 2021-03-11 NOTE — ED Notes (Signed)
[place] regarding pts care and dispo. [facility] was informed pt is COVID+. Staff states they will accept pt back to facility. [facility] called for transport.

## 2021-03-11 NOTE — ED Notes (Signed)
[facility] called for transport.  

## 2021-03-12 DIAGNOSIS — R279 Unspecified lack of coordination: Secondary | ICD-10-CM | POA: Diagnosis not present

## 2021-03-12 DIAGNOSIS — Z515 Encounter for palliative care: Secondary | ICD-10-CM | POA: Diagnosis not present

## 2021-03-12 DIAGNOSIS — Z743 Need for continuous supervision: Secondary | ICD-10-CM | POA: Diagnosis not present

## 2021-03-12 DIAGNOSIS — R627 Adult failure to thrive: Secondary | ICD-10-CM | POA: Diagnosis not present

## 2021-03-12 DIAGNOSIS — R32 Unspecified urinary incontinence: Secondary | ICD-10-CM | POA: Diagnosis not present

## 2021-03-12 DIAGNOSIS — R0902 Hypoxemia: Secondary | ICD-10-CM | POA: Diagnosis not present

## 2021-03-12 DIAGNOSIS — F330 Major depressive disorder, recurrent, mild: Secondary | ICD-10-CM | POA: Diagnosis not present

## 2021-03-12 DIAGNOSIS — K219 Gastro-esophageal reflux disease without esophagitis: Secondary | ICD-10-CM | POA: Diagnosis not present

## 2021-03-12 DIAGNOSIS — F0391 Unspecified dementia with behavioral disturbance: Secondary | ICD-10-CM | POA: Diagnosis not present

## 2021-03-12 DIAGNOSIS — R159 Full incontinence of feces: Secondary | ICD-10-CM | POA: Diagnosis not present

## 2021-03-12 DIAGNOSIS — F0280 Dementia in other diseases classified elsewhere without behavioral disturbance: Secondary | ICD-10-CM | POA: Diagnosis not present

## 2021-03-12 DIAGNOSIS — I10 Essential (primary) hypertension: Secondary | ICD-10-CM | POA: Diagnosis not present

## 2021-03-12 DIAGNOSIS — G311 Senile degeneration of brain, not elsewhere classified: Secondary | ICD-10-CM | POA: Diagnosis not present

## 2021-03-14 DIAGNOSIS — G311 Senile degeneration of brain, not elsewhere classified: Secondary | ICD-10-CM | POA: Diagnosis not present

## 2021-03-14 DIAGNOSIS — R159 Full incontinence of feces: Secondary | ICD-10-CM | POA: Diagnosis not present

## 2021-03-14 DIAGNOSIS — K219 Gastro-esophageal reflux disease without esophagitis: Secondary | ICD-10-CM | POA: Diagnosis not present

## 2021-03-14 DIAGNOSIS — F0280 Dementia in other diseases classified elsewhere without behavioral disturbance: Secondary | ICD-10-CM | POA: Diagnosis not present

## 2021-03-14 DIAGNOSIS — I10 Essential (primary) hypertension: Secondary | ICD-10-CM | POA: Diagnosis not present

## 2021-03-14 DIAGNOSIS — R32 Unspecified urinary incontinence: Secondary | ICD-10-CM | POA: Diagnosis not present

## 2021-03-14 LAB — URINE CULTURE: Culture: >=100000 — AB

## 2021-03-15 ENCOUNTER — Telehealth (HOSPITAL_BASED_OUTPATIENT_CLINIC_OR_DEPARTMENT_OTHER): Payer: Self-pay | Admitting: Emergency Medicine

## 2021-03-15 NOTE — Telephone Encounter (Signed)
Post ED Visit - Positive Culture Follow-up  Culture report reviewed by antimicrobial stewardship pharmacist: [facility] Team []  Elenor Quinones, Pharm.D. []  Heide Guile, Pharm.D., BCPS AQ-ID []  Parks Neptune, Pharm.D., BCPS []  Alycia Rossetti, Pharm.D., BCPS []  [place], Pharm.D., BCPS, AAHIVP []  Legrand Como, Pharm.D., BCPS, AAHIVP []  Salome Arnt, PharmD, BCPS []  Johnnette Gourd, PharmD, BCPS []  Hughes Better, PharmD, BCPS []  Leeroy Cha, PharmD []  Laqueta Linden, PharmD, BCPS []  Albertina Parr, PharmD  [place] Team []  Leodis Sias, PharmD []  Lindell Spar, PharmD []  Royetta Asal, PharmD []  Graylin Shiver, Rph []  Rema Fendt) Glennon Mac, PharmD []  Arlyn Dunning, PharmD []  Netta Cedars, PharmD []  Dia Sitter, PharmD []  Leone Haven, PharmD []  Gretta Arab, PharmD []  Theodis Shove, PharmD [x]  Peggyann Juba, PharmD []  Reuel Boom, PharmD   Positive urine culture Treated with Cephalexin, organism sensitive to the same and no further patient follow-up is required at this time.  Sandi Raveling Azhia Siefken 03/15/2021, 10:19 AM

## 2021-03-16 LAB — CULTURE, BLOOD (ROUTINE X 2)
Culture: NO GROWTH
Culture: NO GROWTH
Special Requests: ADEQUATE

## 2021-03-19 DIAGNOSIS — R159 Full incontinence of feces: Secondary | ICD-10-CM | POA: Diagnosis not present

## 2021-03-19 DIAGNOSIS — K219 Gastro-esophageal reflux disease without esophagitis: Secondary | ICD-10-CM | POA: Diagnosis not present

## 2021-03-19 DIAGNOSIS — G311 Senile degeneration of brain, not elsewhere classified: Secondary | ICD-10-CM | POA: Diagnosis not present

## 2021-03-19 DIAGNOSIS — R32 Unspecified urinary incontinence: Secondary | ICD-10-CM | POA: Diagnosis not present

## 2021-03-19 DIAGNOSIS — I10 Essential (primary) hypertension: Secondary | ICD-10-CM | POA: Diagnosis not present

## 2021-03-19 DIAGNOSIS — F0280 Dementia in other diseases classified elsewhere without behavioral disturbance: Secondary | ICD-10-CM | POA: Diagnosis not present

## 2021-03-20 DIAGNOSIS — G311 Senile degeneration of brain, not elsewhere classified: Secondary | ICD-10-CM | POA: Diagnosis not present

## 2021-03-20 DIAGNOSIS — R32 Unspecified urinary incontinence: Secondary | ICD-10-CM | POA: Diagnosis not present

## 2021-03-20 DIAGNOSIS — K219 Gastro-esophageal reflux disease without esophagitis: Secondary | ICD-10-CM | POA: Diagnosis not present

## 2021-03-20 DIAGNOSIS — F0280 Dementia in other diseases classified elsewhere without behavioral disturbance: Secondary | ICD-10-CM | POA: Diagnosis not present

## 2021-03-20 DIAGNOSIS — I10 Essential (primary) hypertension: Secondary | ICD-10-CM | POA: Diagnosis not present

## 2021-03-20 DIAGNOSIS — R159 Full incontinence of feces: Secondary | ICD-10-CM | POA: Diagnosis not present

## 2021-03-21 ENCOUNTER — Emergency Department (HOSPITAL_BASED_OUTPATIENT_CLINIC_OR_DEPARTMENT_OTHER)
Admission: EM | Admit: 2021-03-21 | Discharge: 2021-03-21 | Disposition: A | Discharge status: Home / Self Care | Payer: MEDICARE | Attending: Emergency Medicine | Admitting: Emergency Medicine

## 2021-03-21 ENCOUNTER — Emergency Department (HOSPITAL_BASED_OUTPATIENT_CLINIC_OR_DEPARTMENT_OTHER): Payer: MEDICARE

## 2021-03-21 ENCOUNTER — Encounter (HOSPITAL_BASED_OUTPATIENT_CLINIC_OR_DEPARTMENT_OTHER): Payer: Self-pay

## 2021-03-21 ENCOUNTER — Other Ambulatory Visit: Payer: Self-pay

## 2021-03-21 DIAGNOSIS — I10 Essential (primary) hypertension: Secondary | ICD-10-CM | POA: Insufficient documentation

## 2021-03-21 DIAGNOSIS — W19XXXA Unspecified fall, initial encounter: Secondary | ICD-10-CM | POA: Diagnosis not present

## 2021-03-21 DIAGNOSIS — Z85038 Personal history of other malignant neoplasm of large intestine: Secondary | ICD-10-CM | POA: Diagnosis not present

## 2021-03-21 DIAGNOSIS — F0390 Unspecified dementia without behavioral disturbance: Secondary | ICD-10-CM | POA: Insufficient documentation

## 2021-03-21 DIAGNOSIS — S0990XA Unspecified injury of head, initial encounter: Secondary | ICD-10-CM | POA: Diagnosis present

## 2021-03-21 DIAGNOSIS — S0101XA Laceration without foreign body of scalp, initial encounter: Secondary | ICD-10-CM | POA: Diagnosis not present

## 2021-03-21 DIAGNOSIS — R5381 Other malaise: Secondary | ICD-10-CM | POA: Diagnosis not present

## 2021-03-21 DIAGNOSIS — G311 Senile degeneration of brain, not elsewhere classified: Secondary | ICD-10-CM | POA: Diagnosis not present

## 2021-03-21 DIAGNOSIS — S0181XA Laceration without foreign body of other part of head, initial encounter: Secondary | ICD-10-CM | POA: Insufficient documentation

## 2021-03-21 DIAGNOSIS — Z7982 Long term (current) use of aspirin: Secondary | ICD-10-CM | POA: Insufficient documentation

## 2021-03-21 DIAGNOSIS — Z043 Encounter for examination and observation following other accident: Secondary | ICD-10-CM | POA: Diagnosis not present

## 2021-03-21 DIAGNOSIS — R58 Hemorrhage, not elsewhere classified: Secondary | ICD-10-CM | POA: Diagnosis not present

## 2021-03-21 DIAGNOSIS — Z743 Need for continuous supervision: Secondary | ICD-10-CM | POA: Diagnosis not present

## 2021-03-21 DIAGNOSIS — F0280 Dementia in other diseases classified elsewhere without behavioral disturbance: Secondary | ICD-10-CM | POA: Diagnosis not present

## 2021-03-21 DIAGNOSIS — Z23 Encounter for immunization: Secondary | ICD-10-CM | POA: Insufficient documentation

## 2021-03-21 DIAGNOSIS — Z79899 Other long term (current) drug therapy: Secondary | ICD-10-CM | POA: Insufficient documentation

## 2021-03-21 DIAGNOSIS — I499 Cardiac arrhythmia, unspecified: Secondary | ICD-10-CM | POA: Diagnosis not present

## 2021-03-21 DIAGNOSIS — K219 Gastro-esophageal reflux disease without esophagitis: Secondary | ICD-10-CM | POA: Diagnosis not present

## 2021-03-21 DIAGNOSIS — R404 Transient alteration of awareness: Secondary | ICD-10-CM | POA: Diagnosis not present

## 2021-03-21 DIAGNOSIS — R32 Unspecified urinary incontinence: Secondary | ICD-10-CM | POA: Diagnosis not present

## 2021-03-21 DIAGNOSIS — M542 Cervicalgia: Secondary | ICD-10-CM | POA: Diagnosis not present

## 2021-03-21 DIAGNOSIS — R000 Tachycardia, unspecified: Secondary | ICD-10-CM | POA: Diagnosis not present

## 2021-03-21 DIAGNOSIS — Z7401 Bed confinement status: Secondary | ICD-10-CM | POA: Diagnosis not present

## 2021-03-21 DIAGNOSIS — W06XXXA Fall from bed, initial encounter: Secondary | ICD-10-CM | POA: Insufficient documentation

## 2021-03-21 DIAGNOSIS — M4802 Spinal stenosis, cervical region: Secondary | ICD-10-CM | POA: Diagnosis not present

## 2021-03-21 DIAGNOSIS — R159 Full incontinence of feces: Secondary | ICD-10-CM | POA: Diagnosis not present

## 2021-03-21 DIAGNOSIS — M50322 Other cervical disc degeneration at C5-C6 level: Secondary | ICD-10-CM | POA: Diagnosis not present

## 2021-03-21 MED ORDER — TETANUS-DIPHTH-ACELL PERTUSSIS 5-2.5-18.5 LF-MCG/0.5 IM SUSY
0.5000 mL | PREFILLED_SYRINGE | Freq: Once | INTRAMUSCULAR | Status: AC
  Administered 2021-03-21: 0.5 mL via INTRAMUSCULAR
  Filled 2021-03-21: qty 0.5

## 2021-03-21 NOTE — ED Notes (Signed)
[facility] at bedside for report.

## 2021-03-21 NOTE — ED Triage Notes (Signed)
Pt arrived via EMS for a fall out of bed at her facility. Per facility pt did not have LOC. Pt has lac to left side of forehead. Pt takes daily aspirin. Pt is in C-collar

## 2021-03-21 NOTE — ED Notes (Signed)
Cleaned blood from patients body, face,hair. Pt oriented to self. Pt states she is hungry. She accepted water,oatmeal.

## 2021-03-21 NOTE — ED Provider Notes (Signed)
[facility] EMERGENCY DEPT Provider Note   CSN: [id number] Arrival date & time: 03/21/21  0358     History Chief Complaint  Patient presents with  . Fall    Michelle Gregory is a 87 y.o. female.  The history is provided by the EMS personnel. The history is limited by the condition of the patient (level 5 caveat dementia ).  Fall This is a new problem. The current episode started less than 1 hour ago. The problem occurs constantly. The problem has not changed since onset.Pertinent negatives include no shortness of breath. Nothing aggravates the symptoms. Nothing relieves the symptoms. She has tried nothing for the symptoms. The treatment provided no relief.  Patient with dementia on ASA with fall striking forehead.        Past Medical History:  Diagnosis Date  . Allergic rhinitis   . Anxiety   . C. difficile diarrhea 10/2017  . Cancer ([place])   . Dementia ([place])   . Depression   . GERD (gastroesophageal reflux disease)   . History of colon cancer   . History of recurrent UTIs   . Hyperlipidemia   . Hypertension   . Osteoarthritis   . Spinal compression fracture ([place])   . Vertigo     Patient Active Problem List   Diagnosis Date Noted  . Sepsis ([place]) 08/02/2020  . Lactic acidosis 08/02/2020  . Acute metabolic encephalopathy [id number]  . Screening-pulmonary TB 07/24/2020  . Syncope 05/23/2020  . Sundowning 05/23/2020  . Dementia with behavioral disturbance ([place])   . Acute blood loss as cause of postoperative anemia 06/27/2019  . History of pelvic fracture 06/27/2019  . Closed displaced intertrochanteric fracture of left femur ([place]) 06/21/2019  . Anxiety with depression 06/21/2019  . Diarrhea 03/24/2019  . Shingles 01/23/2019  . Fracture, ulna, distal 12/06/2018  . Thrombocytopenia ([place]) 12/06/2018  . Dementia without behavioral disturbance ([place]) 11/22/2018  . Hand injury, left, initial encounter 09/01/2018  . H/O falling 05/03/2018  . Episodic  confusion 05/03/2018  . Fall at home 03/29/2018  . Anxiety disorder 03/29/2018  . Generalized weakness 03/29/2018  . Chronic pain 02/09/2018  . Cerumen impaction 12/06/2017  . H/O Clostridium difficile infection 10/13/2017  . Recurrent UTI (urinary tract infection) 09/06/2017  . Auditory hallucinations 03/03/2017  . Female cystocele 04/13/2016  . Bowel habit changes 12/30/2015  . Compression fracture 12/30/2015  . Routine general medical examination at a health care facility 04/09/2015  . Candidal intertrigo 03/28/2014  . Bilateral hearing loss due to cerumen impaction 01/17/2014  . Encounter for Medicare annual wellness exam 03/21/2013  . Gout 09/19/2012  . Age related osteoporosis 05/05/2011  . Degenerative lumbar disc 01/20/2011  . VARICOSE VEINS, LOWER EXTREMITIES 09/04/2010  . Prediabetes 01/08/2010  . Hyperlipidemia 05/09/2008  . Essential hypertension 05/09/2008  . HEMORRHOIDS, INTERNAL 05/09/2008  . Allergic rhinitis 05/09/2008  . Mild reactive airways disease 05/09/2008  . GERD 05/09/2008  . IBS 05/09/2008  . Osteoarthritis 05/09/2008  . History of malignant neoplasm of large intestine 05/09/2008    Past Surgical History:  Procedure Laterality Date  . APPENDECTOMY    . CATARACT EXTRACTION    . COLON RESECTION    . INTRAMEDULLARY (IM) NAIL INTERTROCHANTERIC Left 06/22/2019   Procedure: INTRAMEDULLARY (IM) NAIL INTERTROCHANTRIC;  Surgeon: Rod Can, MD;  Location: [facility] ORS;  Service: Orthopedics;  Laterality: Left;  . TONSILLECTOMY       OB History   No obstetric history on file.     Family History  Problem Relation Age of Onset  . Prostate cancer Father   . Heart attack Mother   . Gout Mother     Social History   Tobacco Use  . Smoking status: Never Smoker  . Smokeless tobacco: Never Used  Substance Use Topics  . Alcohol use: No    Alcohol/week: 0.0 standard drinks  . Drug use: No    Home Medications Prior to Admission medications    Medication Sig Start Date End Date Taking? Authorizing Provider  allopurinol (ZYLOPRIM) 100 MG tablet TAKE 2 TABLETS BY MOUTH  DAILY Patient taking differently: Take 100 mg by mouth daily.  01/03/20   Tower, [name], MD  Ascorbic Acid (VITAMIN C) 100 MG tablet Take 100 mg by mouth daily.    [provider]  aspirin 81 MG tablet Take 81 mg by mouth daily.      [provider]  calcium-vitamin D (CALCIUM 500 +D) 500 MG tablet Take 1 tablet by mouth daily.     [provider]  cephALEXin (KEFLEX) 500 MG capsule Take 1 capsule (500 mg total) by mouth 4 (four) times daily. 03/11/21   Valarie Merino, MD  CRANBERRY PO Take 1 capsule by mouth 2 (two) times daily.    [provider]  memantine (NAMENDA) 5 MG tablet Take 1 tablet (5 mg total) by mouth 2 (two) times daily. 07/24/20   Tower, [name], MD  metoprolol tartrate (LOPRESSOR) 50 MG tablet TAKE 1 TABLET BY MOUTH  TWICE DAILY 10/08/20   Tower, [name], MD  mirtazapine (REMERON) 15 MG tablet Take 1 tablet (15 mg total) by mouth at bedtime. 07/13/19   Tower, [name], MD  multivitamin [facility]) per tablet Take 1 tablet by mouth daily.      [provider]  Probiotic Product (PROBIOTIC DAILY PO) Take 1 tablet by mouth daily.    [provider]  risperiDONE (RISPERDAL) 1 MG/ML oral solution Give 0.5 mg po in am and 1 mg in pm 09/05/20   Tower, [name], MD    Allergies    Amoxicillin-pot clavulanate, Keflex [cephalexin], and Septra [sulfamethoxazole-trimethoprim]  Review of Systems   Review of Systems  Unable to perform ROS: Dementia  Constitutional: Negative for fever.  HENT: Negative for facial swelling and rhinorrhea.   Eyes: Negative for redness.  Respiratory: Negative for shortness of breath.   Gastrointestinal: Negative for vomiting.  Musculoskeletal: Negative for joint swelling.  Skin: Positive for wound.  Neurological: Negative for facial asymmetry.  Psychiatric/Behavioral: Negative for  agitation.    Physical Exam Updated Vital Signs BP 131/61 (BP Location: Left Arm)   Pulse 67   Temp 97.8 F (36.6 C) (Axillary)   Resp 18   Ht 5\' 3"  (1.6 m)   Wt 45.4 kg   LMP 11/02/1980   SpO2 96%   BMI 17.71 kg/m   Physical Exam Vitals and nursing note reviewed.  Constitutional:      General: She is not in acute distress.    Appearance: Normal appearance.  HENT:     Head: Normocephalic. No raccoon eyes or Battle's sign.     Jaw: No trismus or tenderness.      Right Ear: Tympanic membrane normal.     Left Ear: Tympanic membrane normal.     Nose: Nose normal. No nasal deformity or rhinorrhea.     Right Nostril: No epistaxis or septal hematoma.     Left Nostril: No epistaxis or septal hematoma.     Right  Sinus: No maxillary sinus tenderness or frontal sinus tenderness.     Left Sinus: No maxillary sinus tenderness or frontal sinus tenderness.     Mouth/Throat:     Pharynx: Oropharynx is clear.  Eyes:     Conjunctiva/sclera: Conjunctivae normal.     Pupils: Pupils are equal, round, and reactive to light.  Cardiovascular:     Rate and Rhythm: Normal rate and regular rhythm.     Pulses: Normal pulses.     Heart sounds: Normal heart sounds.  Pulmonary:     Effort: Pulmonary effort is normal.     Breath sounds: Normal breath sounds.  Abdominal:     General: Abdomen is flat. Bowel sounds are normal.     Palpations: Abdomen is soft.     Tenderness: There is no abdominal tenderness. There is no guarding.  Musculoskeletal:        General: Normal range of motion.     Right wrist: Normal. No snuff box tenderness.     Left wrist: Normal. No snuff box tenderness.     Right hand: Normal.     Left hand: Normal.     Cervical back: Normal range of motion and neck supple.     Right hip: Normal.     Left hip: Normal.     Right knee: Normal.     Left knee: Normal.     Right ankle: Normal.     Right Achilles Tendon: Normal.     Left ankle: Normal.     Left Achilles Tendon:  Normal.     Right foot: Normal.     Left foot: Normal.  Skin:    General: Skin is warm and dry.     Capillary Refill: Capillary refill takes less than 2 seconds.  Neurological:     Mental Status: She is alert.     Deep Tendon Reflexes: Reflexes normal.  Psychiatric:        Behavior: Behavior normal.     ED Results / Procedures / Treatments   Labs (all labs ordered are listed, but only abnormal results are displayed) Labs Reviewed - No data to display  EKG None  Radiology CT Head Wo Contrast  Result Date: 03/21/2021 CLINICAL DATA:  Fall, facial trauma, scalp laceration EXAM: CT HEAD WITHOUT CONTRAST CT CERVICAL SPINE WITHOUT CONTRAST TECHNIQUE: Multidetector CT imaging of the head and cervical spine was performed following the standard protocol without intravenous contrast. Multiplanar CT image reconstructions of the cervical spine were also generated. COMPARISON:  None. FINDINGS: CT HEAD FINDINGS Brain: Normal anatomic configuration. Moderate parenchymal volume loss is commensurate with the patient's age. Moderate periventricular white matter changes are present likely reflecting the sequela of small vessel ischemia. No abnormal intra or extra-axial mass lesion or fluid collection. No abnormal mass effect or midline shift. No evidence of acute intracranial hemorrhage or infarct. Ventricular size is normal. Cerebellum unremarkable. Vascular: No asymmetric hyperdense vasculature at the skull base. Skull: Intact Sinuses/Orbits: There is an air-fluid level within the left frontal sinus raising the question of CSF rhinorrhea. Remaining paranasal sinuses are clear. Orbits are unremarkable. Other: Mastoid air cells and middle ear cavities are clear. Shallow scalp laceration is seen sagitally anterior to the frontal bone. CT CERVICAL SPINE FINDINGS Alignment: Mild reversal of the normal cervical lordosis at C4-C6, likely degenerative in nature. No listhesis. Skull base and vertebrae: Craniocervical  alignment is normal. The atlantodental interval is not widened. There is moderate degenerative change noted at the atlantodental articulation. No acute fracture  of the cervical spine. No lytic or blastic bone lesions. Soft tissues and spinal canal: Posterior disc osteophyte complex at C5-6 results in moderate central canal stenosis with flattening of the thecal sac and AP diameter of the canal of approximately 6-7 mm. Similar degenerative changes result in milder degrees of central canal stenosis at C4-5 and C6-7. No canal hematoma. No prevertebral soft tissue swelling. The paraspinal soft tissues are otherwise unremarkable. Disc levels: There is intervertebral disc space narrowing and endplate remodeling at C5-6 in keeping with changes of moderate to severe degenerative disc disease. Milder degenerative changes are noted throughout the remainder of the cervical spine with intra discal calcifications and endplate remodeling. Review of the axial images demonstrates multilevel advanced uncovertebral and facet arthrosis resulting in multilevel moderate to severe neuroforaminal narrowing, most severe at C3-4 and C5-6. Upper chest: Moderate vascular calcification is seen within the carotid siphons bilaterally. 2.7 cm nodule is seen within the right thyroid gland. Other: None IMPRESSION: No acute intracranial injury. No definite calvarial fracture identified. Fluid opacification of the left frontal sinus suspicious for CSF rhinorrhea. High-resolution maxillofacial CT is recommended to assess the cranial floor superior to the left frontal sinus. Additionally, sampling of the a fluid and evaluation for beta 2 transferrin may be helpful to confirm CSF rhinorrhea. No acute fracture or listhesis of the cervical spine. Multilevel degenerative disc and degenerative joint disease resulting in moderate central canal stenosis at C5-6 and multilevel neuroforaminal narrowing, most severe at C3-4 and C5-6. Electronically Signed   By:  Helyn Numbers MD   On: 03/21/2021 04:38   CT Cervical Spine Wo Contrast  Result Date: 03/21/2021 CLINICAL DATA:  Fall, facial trauma, scalp laceration EXAM: CT HEAD WITHOUT CONTRAST CT CERVICAL SPINE WITHOUT CONTRAST TECHNIQUE: Multidetector CT imaging of the head and cervical spine was performed following the standard protocol without intravenous contrast. Multiplanar CT image reconstructions of the cervical spine were also generated. COMPARISON:  None. FINDINGS: CT HEAD FINDINGS Brain: Normal anatomic configuration. Moderate parenchymal volume loss is commensurate with the patient's age. Moderate periventricular white matter changes are present likely reflecting the sequela of small vessel ischemia. No abnormal intra or extra-axial mass lesion or fluid collection. No abnormal mass effect or midline shift. No evidence of acute intracranial hemorrhage or infarct. Ventricular size is normal. Cerebellum unremarkable. Vascular: No asymmetric hyperdense vasculature at the skull base. Skull: Intact Sinuses/Orbits: There is an air-fluid level within the left frontal sinus raising the question of CSF rhinorrhea. Remaining paranasal sinuses are clear. Orbits are unremarkable. Other: Mastoid air cells and middle ear cavities are clear. Shallow scalp laceration is seen sagitally anterior to the frontal bone. CT CERVICAL SPINE FINDINGS Alignment: Mild reversal of the normal cervical lordosis at C4-C6, likely degenerative in nature. No listhesis. Skull base and vertebrae: Craniocervical alignment is normal. The atlantodental interval is not widened. There is moderate degenerative change noted at the atlantodental articulation. No acute fracture of the cervical spine. No lytic or blastic bone lesions. Soft tissues and spinal canal: Posterior disc osteophyte complex at C5-6 results in moderate central canal stenosis with flattening of the thecal sac and AP diameter of the canal of approximately 6-7 mm. Similar degenerative  changes result in milder degrees of central canal stenosis at C4-5 and C6-7. No canal hematoma. No prevertebral soft tissue swelling. The paraspinal soft tissues are otherwise unremarkable. Disc levels: There is intervertebral disc space narrowing and endplate remodeling at C5-6 in keeping with changes of moderate to severe degenerative disc disease. Milder  degenerative changes are noted throughout the remainder of the cervical spine with intra discal calcifications and endplate remodeling. Review of the axial images demonstrates multilevel advanced uncovertebral and facet arthrosis resulting in multilevel moderate to severe neuroforaminal narrowing, most severe at C3-4 and C5-6. Upper chest: Moderate vascular calcification is seen within the carotid siphons bilaterally. 2.7 cm nodule is seen within the right thyroid gland. Other: None IMPRESSION: No acute intracranial injury. No definite calvarial fracture identified. Fluid opacification of the left frontal sinus suspicious for CSF rhinorrhea. High-resolution maxillofacial CT is recommended to assess the cranial floor superior to the left frontal sinus. Additionally, sampling of the a fluid and evaluation for beta 2 transferrin may be helpful to confirm CSF rhinorrhea. No acute fracture or listhesis of the cervical spine. Multilevel degenerative disc and degenerative joint disease resulting in moderate central canal stenosis at C5-6 and multilevel neuroforaminal narrowing, most severe at C3-4 and C5-6. Electronically Signed   By: Fidela Salisbury MD   On: 03/21/2021 04:38    Procedures .Marland KitchenLaceration Repair  Date/Time: 03/21/2021 5:05 AM Performed by: Veatrice Kells, MD Authorized by: Veatrice Kells, MD   Consent:    Consent obtained:  Emergent situation   Risks, benefits, and alternatives were discussed: not applicable   Universal protocol:    Patient identity confirmed:  Arm band Anesthesia:    Anesthesia method:  Topical application Laceration  details:    Location:  Face   Face location:  Forehead   Length (cm):  1.5   Depth (mm):  1 Pre-procedure details:    Preparation:  Patient was prepped and draped in usual sterile fashion Exploration:    Hemostasis achieved with:  LET   Wound extent: no areolar tissue violation noted   Treatment:    Area cleansed with:  Chlorhexidine   Amount of cleaning:  Extensive   Irrigation solution:  Sterile water   Debridement:  None   Undermining:  None Skin repair:    Repair method:  Tissue adhesive Approximation:    Approximation:  Close Repair type:    Repair type:  Simple Post-procedure details:    Dressing:  Open (no dressing)   Procedure completion:  Tolerated well, no immediate complications     Medications Ordered in ED Medications  Tdap (BOOSTRIX) injection 0.5 mL (has no administration in time range)    ED Course  I have reviewed the triage vital signs and the nursing notes.  Pertinent labs & imaging results that were available during my care of the patient were reviewed by me and considered in my medical decision making (see chart for details).    Midface is stable there is no blood or rhinorrhea from either nare, there is no drainage down the throat.  I do not believe there is a CSF leak.  CT maxillofacial without CSF seen   Michelle Gregory was evaluated in Emergency Department on 03/21/2021 for the symptoms described in the history of present illness. She was evaluated in the context of the global COVID-19 pandemic, which necessitated consideration that the patient might be at risk for infection with the SARS-CoV-2 virus that causes COVID-19. Institutional protocols and algorithms that pertain to the evaluation of patients at risk for COVID-19 are in a state of rapid change based on information released by regulatory bodies including the CDC and federal and state organizations. These policies and algorithms were followed during the patient's care in the ED.  Final Clinical  Impression(s) / ED Diagnoses Return for intractable cough, coughing up  blood, fevers >100.4 unrelieved by medication, shortness of breath, intractable vomiting, chest pain, shortness of breath, weakness, numbness, changes in speech, facial asymmetry, abdominal pain, passing out, Inability to tolerate liquids or food, cough, altered mental status or any concerns. No signs of systemic illness or infection. The patient is nontoxic-appearing on exam and vital signs are within normal limits.  I have reviewed the triage vital signs and the nursing notes. Pertinent labs & imaging results that were available during my care of the patient were reviewed by me and considered in my medical decision making (see chart for details). After history, exam, and medical workup I feel the patient has been appropriately medically screened and is safe for discharge home. Pertinent diagnoses were discussed with the patient. Patient was given return precautions.   Bona Hubbard, MD 03/21/21 [phone]

## 2021-03-21 NOTE — ED Notes (Signed)
RN called patients daughter [name] and updated.[name] gave nurse phone numbers for the facility [facility]. [phone]. RN also called facility and advised of patients return, the receiving staff stated she would get report from the patients discharge paperwork.

## 2021-03-24 DIAGNOSIS — R32 Unspecified urinary incontinence: Secondary | ICD-10-CM | POA: Diagnosis not present

## 2021-03-24 DIAGNOSIS — I10 Essential (primary) hypertension: Secondary | ICD-10-CM | POA: Diagnosis not present

## 2021-03-24 DIAGNOSIS — F0280 Dementia in other diseases classified elsewhere without behavioral disturbance: Secondary | ICD-10-CM | POA: Diagnosis not present

## 2021-03-24 DIAGNOSIS — R159 Full incontinence of feces: Secondary | ICD-10-CM | POA: Diagnosis not present

## 2021-03-24 DIAGNOSIS — K219 Gastro-esophageal reflux disease without esophagitis: Secondary | ICD-10-CM | POA: Diagnosis not present

## 2021-03-24 DIAGNOSIS — G311 Senile degeneration of brain, not elsewhere classified: Secondary | ICD-10-CM | POA: Diagnosis not present

## 2021-03-25 DIAGNOSIS — G311 Senile degeneration of brain, not elsewhere classified: Secondary | ICD-10-CM | POA: Diagnosis not present

## 2021-03-25 DIAGNOSIS — R32 Unspecified urinary incontinence: Secondary | ICD-10-CM | POA: Diagnosis not present

## 2021-03-25 DIAGNOSIS — K219 Gastro-esophageal reflux disease without esophagitis: Secondary | ICD-10-CM | POA: Diagnosis not present

## 2021-03-25 DIAGNOSIS — R159 Full incontinence of feces: Secondary | ICD-10-CM | POA: Diagnosis not present

## 2021-03-25 DIAGNOSIS — F0280 Dementia in other diseases classified elsewhere without behavioral disturbance: Secondary | ICD-10-CM | POA: Diagnosis not present

## 2021-03-25 DIAGNOSIS — I10 Essential (primary) hypertension: Secondary | ICD-10-CM | POA: Diagnosis not present

## 2021-03-26 DIAGNOSIS — R159 Full incontinence of feces: Secondary | ICD-10-CM | POA: Diagnosis not present

## 2021-03-26 DIAGNOSIS — I10 Essential (primary) hypertension: Secondary | ICD-10-CM | POA: Diagnosis not present

## 2021-03-26 DIAGNOSIS — F0280 Dementia in other diseases classified elsewhere without behavioral disturbance: Secondary | ICD-10-CM | POA: Diagnosis not present

## 2021-03-26 DIAGNOSIS — S0181XA Laceration without foreign body of other part of head, initial encounter: Secondary | ICD-10-CM | POA: Diagnosis not present

## 2021-03-26 DIAGNOSIS — G311 Senile degeneration of brain, not elsewhere classified: Secondary | ICD-10-CM | POA: Diagnosis not present

## 2021-03-26 DIAGNOSIS — F0391 Unspecified dementia with behavioral disturbance: Secondary | ICD-10-CM | POA: Diagnosis not present

## 2021-03-26 DIAGNOSIS — R32 Unspecified urinary incontinence: Secondary | ICD-10-CM | POA: Diagnosis not present

## 2021-03-26 DIAGNOSIS — K219 Gastro-esophageal reflux disease without esophagitis: Secondary | ICD-10-CM | POA: Diagnosis not present

## 2021-03-26 DIAGNOSIS — W19XXXA Unspecified fall, initial encounter: Secondary | ICD-10-CM | POA: Diagnosis not present

## 2021-03-26 DIAGNOSIS — M6281 Muscle weakness (generalized): Secondary | ICD-10-CM | POA: Diagnosis not present

## 2021-03-29 DIAGNOSIS — R159 Full incontinence of feces: Secondary | ICD-10-CM | POA: Diagnosis not present

## 2021-03-29 DIAGNOSIS — R32 Unspecified urinary incontinence: Secondary | ICD-10-CM | POA: Diagnosis not present

## 2021-03-29 DIAGNOSIS — I10 Essential (primary) hypertension: Secondary | ICD-10-CM | POA: Diagnosis not present

## 2021-03-29 DIAGNOSIS — G311 Senile degeneration of brain, not elsewhere classified: Secondary | ICD-10-CM | POA: Diagnosis not present

## 2021-03-29 DIAGNOSIS — K219 Gastro-esophageal reflux disease without esophagitis: Secondary | ICD-10-CM | POA: Diagnosis not present

## 2021-03-29 DIAGNOSIS — F0280 Dementia in other diseases classified elsewhere without behavioral disturbance: Secondary | ICD-10-CM | POA: Diagnosis not present

## 2021-04-02 DIAGNOSIS — R32 Unspecified urinary incontinence: Secondary | ICD-10-CM | POA: Diagnosis not present

## 2021-04-02 DIAGNOSIS — F330 Major depressive disorder, recurrent, mild: Secondary | ICD-10-CM | POA: Diagnosis not present

## 2021-04-02 DIAGNOSIS — K219 Gastro-esophageal reflux disease without esophagitis: Secondary | ICD-10-CM | POA: Diagnosis not present

## 2021-04-02 DIAGNOSIS — Z515 Encounter for palliative care: Secondary | ICD-10-CM | POA: Diagnosis not present

## 2021-04-02 DIAGNOSIS — G311 Senile degeneration of brain, not elsewhere classified: Secondary | ICD-10-CM | POA: Diagnosis not present

## 2021-04-02 DIAGNOSIS — R159 Full incontinence of feces: Secondary | ICD-10-CM | POA: Diagnosis not present

## 2021-04-02 DIAGNOSIS — F0280 Dementia in other diseases classified elsewhere without behavioral disturbance: Secondary | ICD-10-CM | POA: Diagnosis not present

## 2021-04-02 DIAGNOSIS — I10 Essential (primary) hypertension: Secondary | ICD-10-CM | POA: Diagnosis not present

## 2021-04-09 DIAGNOSIS — I10 Essential (primary) hypertension: Secondary | ICD-10-CM | POA: Diagnosis not present

## 2021-04-09 DIAGNOSIS — R159 Full incontinence of feces: Secondary | ICD-10-CM | POA: Diagnosis not present

## 2021-04-09 DIAGNOSIS — G311 Senile degeneration of brain, not elsewhere classified: Secondary | ICD-10-CM | POA: Diagnosis not present

## 2021-04-09 DIAGNOSIS — K219 Gastro-esophageal reflux disease without esophagitis: Secondary | ICD-10-CM | POA: Diagnosis not present

## 2021-04-09 DIAGNOSIS — F0280 Dementia in other diseases classified elsewhere without behavioral disturbance: Secondary | ICD-10-CM | POA: Diagnosis not present

## 2021-04-09 DIAGNOSIS — R32 Unspecified urinary incontinence: Secondary | ICD-10-CM | POA: Diagnosis not present

## 2021-04-10 DIAGNOSIS — I10 Essential (primary) hypertension: Secondary | ICD-10-CM | POA: Diagnosis not present

## 2021-04-10 DIAGNOSIS — F0280 Dementia in other diseases classified elsewhere without behavioral disturbance: Secondary | ICD-10-CM | POA: Diagnosis not present

## 2021-04-10 DIAGNOSIS — G311 Senile degeneration of brain, not elsewhere classified: Secondary | ICD-10-CM | POA: Diagnosis not present

## 2021-04-10 DIAGNOSIS — K219 Gastro-esophageal reflux disease without esophagitis: Secondary | ICD-10-CM | POA: Diagnosis not present

## 2021-04-10 DIAGNOSIS — R159 Full incontinence of feces: Secondary | ICD-10-CM | POA: Diagnosis not present

## 2021-04-10 DIAGNOSIS — R32 Unspecified urinary incontinence: Secondary | ICD-10-CM | POA: Diagnosis not present

## 2021-04-13 DIAGNOSIS — I10 Essential (primary) hypertension: Secondary | ICD-10-CM | POA: Diagnosis not present

## 2021-04-13 DIAGNOSIS — K219 Gastro-esophageal reflux disease without esophagitis: Secondary | ICD-10-CM | POA: Diagnosis not present

## 2021-04-13 DIAGNOSIS — R159 Full incontinence of feces: Secondary | ICD-10-CM | POA: Diagnosis not present

## 2021-04-13 DIAGNOSIS — G311 Senile degeneration of brain, not elsewhere classified: Secondary | ICD-10-CM | POA: Diagnosis not present

## 2021-04-13 DIAGNOSIS — F0280 Dementia in other diseases classified elsewhere without behavioral disturbance: Secondary | ICD-10-CM | POA: Diagnosis not present

## 2021-04-13 DIAGNOSIS — R32 Unspecified urinary incontinence: Secondary | ICD-10-CM | POA: Diagnosis not present

## 2021-04-15 DIAGNOSIS — L603 Nail dystrophy: Secondary | ICD-10-CM | POA: Diagnosis not present

## 2021-04-15 DIAGNOSIS — B351 Tinea unguium: Secondary | ICD-10-CM | POA: Diagnosis not present

## 2021-04-15 DIAGNOSIS — M2041 Other hammer toe(s) (acquired), right foot: Secondary | ICD-10-CM | POA: Diagnosis not present

## 2021-04-15 DIAGNOSIS — I7091 Generalized atherosclerosis: Secondary | ICD-10-CM | POA: Diagnosis not present

## 2021-04-15 DIAGNOSIS — M2042 Other hammer toe(s) (acquired), left foot: Secondary | ICD-10-CM | POA: Diagnosis not present

## 2021-04-17 DIAGNOSIS — I10 Essential (primary) hypertension: Secondary | ICD-10-CM | POA: Diagnosis not present

## 2021-04-17 DIAGNOSIS — K219 Gastro-esophageal reflux disease without esophagitis: Secondary | ICD-10-CM | POA: Diagnosis not present

## 2021-04-17 DIAGNOSIS — R32 Unspecified urinary incontinence: Secondary | ICD-10-CM | POA: Diagnosis not present

## 2021-04-17 DIAGNOSIS — R159 Full incontinence of feces: Secondary | ICD-10-CM | POA: Diagnosis not present

## 2021-04-17 DIAGNOSIS — G311 Senile degeneration of brain, not elsewhere classified: Secondary | ICD-10-CM | POA: Diagnosis not present

## 2021-04-17 DIAGNOSIS — F0280 Dementia in other diseases classified elsewhere without behavioral disturbance: Secondary | ICD-10-CM | POA: Diagnosis not present

## 2021-04-18 DIAGNOSIS — G311 Senile degeneration of brain, not elsewhere classified: Secondary | ICD-10-CM | POA: Diagnosis not present

## 2021-04-18 DIAGNOSIS — K219 Gastro-esophageal reflux disease without esophagitis: Secondary | ICD-10-CM | POA: Diagnosis not present

## 2021-04-18 DIAGNOSIS — F0280 Dementia in other diseases classified elsewhere without behavioral disturbance: Secondary | ICD-10-CM | POA: Diagnosis not present

## 2021-04-18 DIAGNOSIS — I10 Essential (primary) hypertension: Secondary | ICD-10-CM | POA: Diagnosis not present

## 2021-04-18 DIAGNOSIS — R159 Full incontinence of feces: Secondary | ICD-10-CM | POA: Diagnosis not present

## 2021-04-18 DIAGNOSIS — R32 Unspecified urinary incontinence: Secondary | ICD-10-CM | POA: Diagnosis not present

## 2021-04-23 DIAGNOSIS — Z23 Encounter for immunization: Secondary | ICD-10-CM | POA: Diagnosis not present

## 2021-04-24 DIAGNOSIS — R32 Unspecified urinary incontinence: Secondary | ICD-10-CM | POA: Diagnosis not present

## 2021-04-24 DIAGNOSIS — I10 Essential (primary) hypertension: Secondary | ICD-10-CM | POA: Diagnosis not present

## 2021-04-24 DIAGNOSIS — R159 Full incontinence of feces: Secondary | ICD-10-CM | POA: Diagnosis not present

## 2021-04-24 DIAGNOSIS — K219 Gastro-esophageal reflux disease without esophagitis: Secondary | ICD-10-CM | POA: Diagnosis not present

## 2021-04-24 DIAGNOSIS — G311 Senile degeneration of brain, not elsewhere classified: Secondary | ICD-10-CM | POA: Diagnosis not present

## 2021-04-24 DIAGNOSIS — F0280 Dementia in other diseases classified elsewhere without behavioral disturbance: Secondary | ICD-10-CM | POA: Diagnosis not present

## 2021-04-28 DIAGNOSIS — K219 Gastro-esophageal reflux disease without esophagitis: Secondary | ICD-10-CM | POA: Diagnosis not present

## 2021-04-28 DIAGNOSIS — R159 Full incontinence of feces: Secondary | ICD-10-CM | POA: Diagnosis not present

## 2021-04-28 DIAGNOSIS — G311 Senile degeneration of brain, not elsewhere classified: Secondary | ICD-10-CM | POA: Diagnosis not present

## 2021-04-28 DIAGNOSIS — I10 Essential (primary) hypertension: Secondary | ICD-10-CM | POA: Diagnosis not present

## 2021-04-28 DIAGNOSIS — R32 Unspecified urinary incontinence: Secondary | ICD-10-CM | POA: Diagnosis not present

## 2021-04-28 DIAGNOSIS — F0280 Dementia in other diseases classified elsewhere without behavioral disturbance: Secondary | ICD-10-CM | POA: Diagnosis not present

## 2021-05-02 DIAGNOSIS — R32 Unspecified urinary incontinence: Secondary | ICD-10-CM | POA: Diagnosis not present

## 2021-05-02 DIAGNOSIS — Z515 Encounter for palliative care: Secondary | ICD-10-CM | POA: Diagnosis not present

## 2021-05-02 DIAGNOSIS — G311 Senile degeneration of brain, not elsewhere classified: Secondary | ICD-10-CM | POA: Diagnosis not present

## 2021-05-02 DIAGNOSIS — R159 Full incontinence of feces: Secondary | ICD-10-CM | POA: Diagnosis not present

## 2021-05-02 DIAGNOSIS — F330 Major depressive disorder, recurrent, mild: Secondary | ICD-10-CM | POA: Diagnosis not present

## 2021-05-02 DIAGNOSIS — F0280 Dementia in other diseases classified elsewhere without behavioral disturbance: Secondary | ICD-10-CM | POA: Diagnosis not present

## 2021-05-02 DIAGNOSIS — K219 Gastro-esophageal reflux disease without esophagitis: Secondary | ICD-10-CM | POA: Diagnosis not present

## 2021-05-02 DIAGNOSIS — I10 Essential (primary) hypertension: Secondary | ICD-10-CM | POA: Diagnosis not present

## 2021-05-06 DIAGNOSIS — R32 Unspecified urinary incontinence: Secondary | ICD-10-CM | POA: Diagnosis not present

## 2021-05-06 DIAGNOSIS — G311 Senile degeneration of brain, not elsewhere classified: Secondary | ICD-10-CM | POA: Diagnosis not present

## 2021-05-06 DIAGNOSIS — F0280 Dementia in other diseases classified elsewhere without behavioral disturbance: Secondary | ICD-10-CM | POA: Diagnosis not present

## 2021-05-06 DIAGNOSIS — I10 Essential (primary) hypertension: Secondary | ICD-10-CM | POA: Diagnosis not present

## 2021-05-06 DIAGNOSIS — R159 Full incontinence of feces: Secondary | ICD-10-CM | POA: Diagnosis not present

## 2021-05-06 DIAGNOSIS — K219 Gastro-esophageal reflux disease without esophagitis: Secondary | ICD-10-CM | POA: Diagnosis not present

## 2021-05-08 DIAGNOSIS — R159 Full incontinence of feces: Secondary | ICD-10-CM | POA: Diagnosis not present

## 2021-05-08 DIAGNOSIS — G311 Senile degeneration of brain, not elsewhere classified: Secondary | ICD-10-CM | POA: Diagnosis not present

## 2021-05-08 DIAGNOSIS — K219 Gastro-esophageal reflux disease without esophagitis: Secondary | ICD-10-CM | POA: Diagnosis not present

## 2021-05-08 DIAGNOSIS — I10 Essential (primary) hypertension: Secondary | ICD-10-CM | POA: Diagnosis not present

## 2021-05-08 DIAGNOSIS — F0280 Dementia in other diseases classified elsewhere without behavioral disturbance: Secondary | ICD-10-CM | POA: Diagnosis not present

## 2021-05-08 DIAGNOSIS — R32 Unspecified urinary incontinence: Secondary | ICD-10-CM | POA: Diagnosis not present

## 2021-05-11 DIAGNOSIS — R159 Full incontinence of feces: Secondary | ICD-10-CM | POA: Diagnosis not present

## 2021-05-11 DIAGNOSIS — G311 Senile degeneration of brain, not elsewhere classified: Secondary | ICD-10-CM | POA: Diagnosis not present

## 2021-05-11 DIAGNOSIS — R32 Unspecified urinary incontinence: Secondary | ICD-10-CM | POA: Diagnosis not present

## 2021-05-11 DIAGNOSIS — K219 Gastro-esophageal reflux disease without esophagitis: Secondary | ICD-10-CM | POA: Diagnosis not present

## 2021-05-11 DIAGNOSIS — I10 Essential (primary) hypertension: Secondary | ICD-10-CM | POA: Diagnosis not present

## 2021-05-11 DIAGNOSIS — F0280 Dementia in other diseases classified elsewhere without behavioral disturbance: Secondary | ICD-10-CM | POA: Diagnosis not present

## 2021-05-13 DIAGNOSIS — I10 Essential (primary) hypertension: Secondary | ICD-10-CM | POA: Diagnosis not present

## 2021-05-13 DIAGNOSIS — G311 Senile degeneration of brain, not elsewhere classified: Secondary | ICD-10-CM | POA: Diagnosis not present

## 2021-05-13 DIAGNOSIS — K219 Gastro-esophageal reflux disease without esophagitis: Secondary | ICD-10-CM | POA: Diagnosis not present

## 2021-05-13 DIAGNOSIS — F0280 Dementia in other diseases classified elsewhere without behavioral disturbance: Secondary | ICD-10-CM | POA: Diagnosis not present

## 2021-05-13 DIAGNOSIS — R159 Full incontinence of feces: Secondary | ICD-10-CM | POA: Diagnosis not present

## 2021-05-13 DIAGNOSIS — R32 Unspecified urinary incontinence: Secondary | ICD-10-CM | POA: Diagnosis not present

## 2021-05-15 ENCOUNTER — Emergency Department (HOSPITAL_COMMUNITY)
Admission: EM | Admit: 2021-05-15 | Discharge: 2021-05-16 | Disposition: A | Discharge status: Home / Self Care | Payer: MEDICARE | Attending: Emergency Medicine | Admitting: Emergency Medicine

## 2021-05-15 ENCOUNTER — Emergency Department (HOSPITAL_COMMUNITY): Payer: MEDICARE

## 2021-05-15 DIAGNOSIS — S0990XA Unspecified injury of head, initial encounter: Secondary | ICD-10-CM | POA: Diagnosis not present

## 2021-05-15 DIAGNOSIS — Z743 Need for continuous supervision: Secondary | ICD-10-CM | POA: Diagnosis not present

## 2021-05-15 DIAGNOSIS — J45909 Unspecified asthma, uncomplicated: Secondary | ICD-10-CM | POA: Insufficient documentation

## 2021-05-15 DIAGNOSIS — K219 Gastro-esophageal reflux disease without esophagitis: Secondary | ICD-10-CM | POA: Diagnosis not present

## 2021-05-15 DIAGNOSIS — I10 Essential (primary) hypertension: Secondary | ICD-10-CM | POA: Insufficient documentation

## 2021-05-15 DIAGNOSIS — F0390 Unspecified dementia without behavioral disturbance: Secondary | ICD-10-CM | POA: Insufficient documentation

## 2021-05-15 DIAGNOSIS — R404 Transient alteration of awareness: Secondary | ICD-10-CM | POA: Diagnosis not present

## 2021-05-15 DIAGNOSIS — Z85038 Personal history of other malignant neoplasm of large intestine: Secondary | ICD-10-CM | POA: Diagnosis not present

## 2021-05-15 DIAGNOSIS — Z79899 Other long term (current) drug therapy: Secondary | ICD-10-CM | POA: Insufficient documentation

## 2021-05-15 DIAGNOSIS — R32 Unspecified urinary incontinence: Secondary | ICD-10-CM | POA: Diagnosis not present

## 2021-05-15 DIAGNOSIS — W050XXA Fall from non-moving wheelchair, initial encounter: Secondary | ICD-10-CM | POA: Diagnosis not present

## 2021-05-15 DIAGNOSIS — R6889 Other general symptoms and signs: Secondary | ICD-10-CM | POA: Diagnosis not present

## 2021-05-15 DIAGNOSIS — S0181XA Laceration without foreign body of other part of head, initial encounter: Secondary | ICD-10-CM | POA: Diagnosis not present

## 2021-05-15 DIAGNOSIS — F0280 Dementia in other diseases classified elsewhere without behavioral disturbance: Secondary | ICD-10-CM | POA: Diagnosis not present

## 2021-05-15 DIAGNOSIS — R0902 Hypoxemia: Secondary | ICD-10-CM | POA: Diagnosis not present

## 2021-05-15 DIAGNOSIS — S199XXA Unspecified injury of neck, initial encounter: Secondary | ICD-10-CM | POA: Diagnosis not present

## 2021-05-15 DIAGNOSIS — S61411A Laceration without foreign body of right hand, initial encounter: Secondary | ICD-10-CM | POA: Insufficient documentation

## 2021-05-15 DIAGNOSIS — G311 Senile degeneration of brain, not elsewhere classified: Secondary | ICD-10-CM | POA: Diagnosis not present

## 2021-05-15 DIAGNOSIS — Z7982 Long term (current) use of aspirin: Secondary | ICD-10-CM | POA: Insufficient documentation

## 2021-05-15 DIAGNOSIS — R159 Full incontinence of feces: Secondary | ICD-10-CM | POA: Diagnosis not present

## 2021-05-15 MED ORDER — LIDOCAINE-EPINEPHRINE (PF) 2 %-1:200000 IJ SOLN
10.0000 mL | Freq: Once | INTRAMUSCULAR | Status: AC
  Administered 2021-05-15: 10 mL
  Filled 2021-05-15: qty 20

## 2021-05-15 MED ORDER — LIDOCAINE-EPINEPHRINE-TETRACAINE (LET) TOPICAL GEL
3.0000 mL | Freq: Once | TOPICAL | Status: AC
  Administered 2021-05-15: 3 mL via TOPICAL
  Filled 2021-05-15: qty 3

## 2021-05-15 NOTE — ED Notes (Signed)
Called [facility] for transport.  

## 2021-05-15 NOTE — ED Provider Notes (Signed)
[place] DEPT Provider Note   CSN: [id number] Arrival date & time: 05/15/21  1924     History Chief Complaint  Patient presents with   Lytle Michaels    Michelle Gregory is a 87 y.o. female.  Michelle Gregory presents after a fall from a wheelchair. She is at her baseline aside from a skin tear on the right hand and a laceration on her left forehead.  The history is provided by the patient and the EMS personnel. The history is limited by the condition of the patient (severe dementia).  Fall This is a new problem. The current episode started less than 1 hour ago. The problem occurs constantly. The problem has not changed since onset.Pertinent negatives include no shortness of breath. Nothing aggravates the symptoms. Nothing relieves the symptoms. She has tried nothing for the symptoms. The treatment provided no relief.      Past Medical History:  Diagnosis Date   Allergic rhinitis    Anxiety    C. difficile diarrhea 10/2017   Cancer ([place])    Dementia ([place])    Depression    GERD (gastroesophageal reflux disease)    History of colon cancer    History of recurrent UTIs    Hyperlipidemia    Hypertension    Osteoarthritis    Spinal compression fracture [facility])    Vertigo     Patient Active Problem List   Diagnosis Date Noted   Sepsis ([place]) 08/02/2020   Lactic acidosis 76/19/5093   Acute metabolic encephalopathy 26/71/2458   Screening-pulmonary TB 07/24/2020   Syncope 05/23/2020   Sundowning 05/23/2020   Dementia with behavioral disturbance ([place])    Acute blood loss as cause of postoperative anemia 06/27/2019   History of pelvic fracture 06/27/2019   Closed displaced intertrochanteric fracture of left femur ([place]) 06/21/2019   Anxiety with depression 06/21/2019   Diarrhea 03/24/2019   Shingles 01/23/2019   Fracture, ulna, distal 12/06/2018   Thrombocytopenia ([place]) 12/06/2018   Dementia without behavioral disturbance ([place]) 11/22/2018   Hand injury,  left, initial encounter 09/01/2018   H/O falling 05/03/2018   Episodic confusion 05/03/2018   Fall at home 03/29/2018   Anxiety disorder 03/29/2018   Generalized weakness 03/29/2018   Chronic pain 02/09/2018   Cerumen impaction 12/06/2017   H/O Clostridium difficile infection 10/13/2017   Recurrent UTI (urinary tract infection) 09/06/2017   Auditory hallucinations 03/03/2017   Female cystocele 04/13/2016   Bowel habit changes 12/30/2015   Compression fracture 12/30/2015   Routine general medical examination at a health care facility 04/09/2015   Candidal intertrigo 03/28/2014   Bilateral hearing loss due to cerumen impaction 01/17/2014   Encounter for Medicare annual wellness exam 03/21/2013   Gout 09/19/2012   Age related osteoporosis 05/05/2011   Degenerative lumbar disc 01/20/2011   VARICOSE VEINS, LOWER EXTREMITIES 09/04/2010   Prediabetes 01/08/2010   Hyperlipidemia 05/09/2008   Essential hypertension 05/09/2008   HEMORRHOIDS, INTERNAL 05/09/2008   Allergic rhinitis 05/09/2008   Mild reactive airways disease 05/09/2008   GERD 05/09/2008   IBS 05/09/2008   Osteoarthritis 05/09/2008   History of malignant neoplasm of large intestine 05/09/2008    Past Surgical History:  Procedure Laterality Date   APPENDECTOMY     CATARACT EXTRACTION     COLON RESECTION     INTRAMEDULLARY (IM) NAIL INTERTROCHANTERIC Left 06/22/2019   Procedure: INTRAMEDULLARY (IM) NAIL INTERTROCHANTRIC;  Surgeon: Rod Can, MD;  Location: [facility];  Service: Orthopedics;  Laterality: Left;   TONSILLECTOMY  OB History   No obstetric history on file.     Family History  Problem Relation Age of Onset   Prostate cancer Father    Heart attack Mother    Gout Mother     Social History   Tobacco Use   Smoking status: Never   Smokeless tobacco: Never  Substance Use Topics   Alcohol use: No    Alcohol/week: 0.0 standard drinks   Drug use: No    Home Medications Prior to Admission  medications   Medication Sig Start Date End Date Taking? Authorizing Provider  allopurinol (ZYLOPRIM) 100 MG tablet TAKE 2 TABLETS BY MOUTH  DAILY Patient taking differently: Take 100 mg by mouth daily.  01/03/20   Tower, [name], MD  Ascorbic Acid (VITAMIN C) 100 MG tablet Take 100 mg by mouth daily.    [provider]  aspirin 81 MG tablet Take 81 mg by mouth daily.      [provider]  calcium-vitamin D (CALCIUM 500 +D) 500 MG tablet Take 1 tablet by mouth daily.     [provider]  cephALEXin (KEFLEX) 500 MG capsule Take 1 capsule (500 mg total) by mouth 4 (four) times daily. 03/11/21   Valarie Merino, MD  CRANBERRY PO Take 1 capsule by mouth 2 (two) times daily.    [provider]  memantine (NAMENDA) 5 MG tablet Take 1 tablet (5 mg total) by mouth 2 (two) times daily. 07/24/20   Tower, [name], MD  metoprolol tartrate (LOPRESSOR) 50 MG tablet TAKE 1 TABLET BY MOUTH  TWICE DAILY 10/08/20   Tower, [name], MD  mirtazapine (REMERON) 15 MG tablet Take 1 tablet (15 mg total) by mouth at bedtime. 07/13/19   Tower, [name], MD  multivitamin [facility]) per tablet Take 1 tablet by mouth daily.      [provider]  Probiotic Product (PROBIOTIC DAILY PO) Take 1 tablet by mouth daily.    [provider]  risperiDONE (RISPERDAL) 1 MG/ML oral solution Give 0.5 mg po in am and 1 mg in pm 09/05/20   Tower, [name], MD    Allergies    Amoxicillin-pot clavulanate, Keflex [cephalexin], and Septra [sulfamethoxazole-trimethoprim]  Review of Systems   Review of Systems  Unable to perform ROS: Dementia  Respiratory:  Negative for shortness of breath.   Skin:  Positive for wound.   Physical Exam Updated Vital Signs BP (!) 154/106   Pulse 84   Temp (!) 97.5 F (36.4 C) (Oral)   Resp (!) 23   LMP 11/02/1980   SpO2 97%   Physical Exam Constitutional:      Comments: Sitting upright, picking at her pulse oximeter  HENT:     Head:      Comments:  Stellate laceration on left forehead above the eyebrow. Eyes:     Conjunctiva/sclera: Conjunctivae normal.  Pulmonary:     Effort: No respiratory distress.  Abdominal:     General: There is no distension.     Tenderness: There is no abdominal tenderness.  Musculoskeletal:        General: No deformity.     Cervical back: Normal range of motion. No tenderness.     Comments: Superficial skin tear on dorsum of the right hand.  Spine is normal to inspection and is non-tender  Neurological:     General: No focal deficit present.     Comments: Moves all extremities  Psychiatric:     Comments: cooperative  ED Results / Procedures / Treatments   Labs (all labs ordered are listed, but only abnormal results are displayed) Labs Reviewed - No data to display  EKG None  Radiology No results found.  Procedures .Marland KitchenLaceration Repair  Date/Time: 05/15/2021 10:24 PM Performed by: Arnaldo Natal, MD Authorized by: Arnaldo Natal, MD   Consent:    Consent given by: nursing home forms; patient unable to consent due to dementia.   Procedural risks discussed: could not discuss with patient.   Alternatives discussed: could not discuss with patient. Universal protocol:    Immediately prior to procedure, a time out was called: yes     Patient identity confirmed:  Arm band Anesthesia:    Anesthesia method:  Topical application and local infiltration   Topical anesthetic:  LET   Local anesthetic:  Lidocaine 1% WITH epi Laceration details:    Location:  Face   Face location:  Forehead   Length (cm):  2   Depth (mm):  5 Pre-procedure details:    Preparation:  Patient was prepped and draped in usual sterile fashion Exploration:    Limited defect created (wound extended): no     Hemostasis achieved with:  Direct pressure   Wound exploration: wound explored through full range of motion and entire depth of wound visualized     Wound extent: no fascia violation noted, no foreign  bodies/material noted, no muscle damage noted, no nerve damage noted, no underlying fracture noted and no vascular damage noted     Contaminated: no   Treatment:    Wound cleansed with: alcohol.   Amount of cleaning:  Standard   Irrigation solution:  Sterile saline   Irrigation volume:  20 cc   Irrigation method:  Syringe   Visualized foreign bodies/material removed: no     Debridement:  None   Undermining:  None   Scar revision: no   Skin repair:    Repair method:  Sutures   Suture size:  5-0   Suture material:  Prolene   Suture technique:  Simple interrupted   Number of sutures:  5 Approximation:    Approximation:  Close Repair type:    Repair type:  Simple Post-procedure details:    Dressing:  Adhesive bandage   Procedure completion:  Tolerated well, no immediate complications   Medications Ordered in ED Medications  lidocaine-EPINEPHrine-tetracaine (LET) topical gel (has no administration in time range)  lidocaine-EPINEPHrine (XYLOCAINE W/EPI) 2 %-1:200000 (PF) injection 10 mL (has no administration in time range)    ED Course  I have reviewed the triage vital signs and the nursing notes.  Pertinent labs & imaging results that were available during my care of the patient were reviewed by me and considered in my medical decision making (see chart for details).    MDM Rules/Calculators/A&P                          Michelle Gregory presented after a fall from her wheelchair.  She has severe dementia and cannot provide a history.  Imaging was negative for intracranial trauma or C-spine fracture.  Wound was repaired satisfactorily.  She will be sent back to her facility.  I did notice that she was mildly tachycardic.  She is on hospice care.  Fall seems to be mechanical.  No further work-up was thought to be beneficial in this situation. Final Clinical Impression(s) / ED Diagnoses Final diagnoses:  Facial laceration, initial encounter  Fall from  wheelchair, initial encounter   Laceration of right hand without foreign body, initial encounter    Rx / DC Orders ED Discharge Orders     None        Arnaldo Natal, MD 05/15/21 2227

## 2021-05-15 NOTE — ED Triage Notes (Signed)
[facility] from [facility]. Pt fell from a wheelchair an hour ago. Hit her head. Lac on left side of head. Skin tear on right hand. No LOC, no bloodthinners. HX dementia, on hospice care.

## 2021-05-15 NOTE — ED Notes (Signed)
Pt taken to CT.

## 2021-05-15 NOTE — ED Notes (Signed)
Michelle Gregory patient daughter is requesting a call back for an update [phone] HM. [phone] Cell

## 2021-05-15 NOTE — ED Notes (Signed)
Called report to [place] at [facility].

## 2021-05-16 DIAGNOSIS — G311 Senile degeneration of brain, not elsewhere classified: Secondary | ICD-10-CM | POA: Diagnosis not present

## 2021-05-16 DIAGNOSIS — R5381 Other malaise: Secondary | ICD-10-CM | POA: Diagnosis not present

## 2021-05-16 DIAGNOSIS — F0280 Dementia in other diseases classified elsewhere without behavioral disturbance: Secondary | ICD-10-CM | POA: Diagnosis not present

## 2021-05-16 DIAGNOSIS — I10 Essential (primary) hypertension: Secondary | ICD-10-CM | POA: Diagnosis not present

## 2021-05-16 DIAGNOSIS — R404 Transient alteration of awareness: Secondary | ICD-10-CM | POA: Diagnosis not present

## 2021-05-16 DIAGNOSIS — Z7401 Bed confinement status: Secondary | ICD-10-CM | POA: Diagnosis not present

## 2021-05-16 DIAGNOSIS — K219 Gastro-esophageal reflux disease without esophagitis: Secondary | ICD-10-CM | POA: Diagnosis not present

## 2021-05-16 DIAGNOSIS — R159 Full incontinence of feces: Secondary | ICD-10-CM | POA: Diagnosis not present

## 2021-05-16 DIAGNOSIS — R32 Unspecified urinary incontinence: Secondary | ICD-10-CM | POA: Diagnosis not present

## 2021-05-21 DIAGNOSIS — R159 Full incontinence of feces: Secondary | ICD-10-CM | POA: Diagnosis not present

## 2021-05-21 DIAGNOSIS — G311 Senile degeneration of brain, not elsewhere classified: Secondary | ICD-10-CM | POA: Diagnosis not present

## 2021-05-21 DIAGNOSIS — K219 Gastro-esophageal reflux disease without esophagitis: Secondary | ICD-10-CM | POA: Diagnosis not present

## 2021-05-21 DIAGNOSIS — R32 Unspecified urinary incontinence: Secondary | ICD-10-CM | POA: Diagnosis not present

## 2021-05-21 DIAGNOSIS — F0280 Dementia in other diseases classified elsewhere without behavioral disturbance: Secondary | ICD-10-CM | POA: Diagnosis not present

## 2021-05-21 DIAGNOSIS — I10 Essential (primary) hypertension: Secondary | ICD-10-CM | POA: Diagnosis not present

## 2021-05-28 DIAGNOSIS — G311 Senile degeneration of brain, not elsewhere classified: Secondary | ICD-10-CM | POA: Diagnosis not present

## 2021-05-28 DIAGNOSIS — W19XXXA Unspecified fall, initial encounter: Secondary | ICD-10-CM | POA: Diagnosis not present

## 2021-05-28 DIAGNOSIS — M6281 Muscle weakness (generalized): Secondary | ICD-10-CM | POA: Diagnosis not present

## 2021-05-28 DIAGNOSIS — I10 Essential (primary) hypertension: Secondary | ICD-10-CM | POA: Diagnosis not present

## 2021-05-28 DIAGNOSIS — S0181XA Laceration without foreign body of other part of head, initial encounter: Secondary | ICD-10-CM | POA: Diagnosis not present

## 2021-05-28 DIAGNOSIS — R32 Unspecified urinary incontinence: Secondary | ICD-10-CM | POA: Diagnosis not present

## 2021-05-28 DIAGNOSIS — Z9181 History of falling: Secondary | ICD-10-CM | POA: Diagnosis not present

## 2021-05-28 DIAGNOSIS — K219 Gastro-esophageal reflux disease without esophagitis: Secondary | ICD-10-CM | POA: Diagnosis not present

## 2021-05-28 DIAGNOSIS — R159 Full incontinence of feces: Secondary | ICD-10-CM | POA: Diagnosis not present

## 2021-05-28 DIAGNOSIS — F0280 Dementia in other diseases classified elsewhere without behavioral disturbance: Secondary | ICD-10-CM | POA: Diagnosis not present

## 2021-06-02 DIAGNOSIS — F330 Major depressive disorder, recurrent, mild: Secondary | ICD-10-CM | POA: Diagnosis not present

## 2021-06-02 DIAGNOSIS — Z515 Encounter for palliative care: Secondary | ICD-10-CM | POA: Diagnosis not present

## 2021-06-02 DIAGNOSIS — R159 Full incontinence of feces: Secondary | ICD-10-CM | POA: Diagnosis not present

## 2021-06-02 DIAGNOSIS — G311 Senile degeneration of brain, not elsewhere classified: Secondary | ICD-10-CM | POA: Diagnosis not present

## 2021-06-02 DIAGNOSIS — I10 Essential (primary) hypertension: Secondary | ICD-10-CM | POA: Diagnosis not present

## 2021-06-02 DIAGNOSIS — F0280 Dementia in other diseases classified elsewhere without behavioral disturbance: Secondary | ICD-10-CM | POA: Diagnosis not present

## 2021-06-02 DIAGNOSIS — R32 Unspecified urinary incontinence: Secondary | ICD-10-CM | POA: Diagnosis not present

## 2021-06-02 DIAGNOSIS — K219 Gastro-esophageal reflux disease without esophagitis: Secondary | ICD-10-CM | POA: Diagnosis not present

## 2021-06-04 DIAGNOSIS — S0181XA Laceration without foreign body of other part of head, initial encounter: Secondary | ICD-10-CM | POA: Diagnosis not present

## 2021-06-04 DIAGNOSIS — R159 Full incontinence of feces: Secondary | ICD-10-CM | POA: Diagnosis not present

## 2021-06-04 DIAGNOSIS — K219 Gastro-esophageal reflux disease without esophagitis: Secondary | ICD-10-CM | POA: Diagnosis not present

## 2021-06-04 DIAGNOSIS — G311 Senile degeneration of brain, not elsewhere classified: Secondary | ICD-10-CM | POA: Diagnosis not present

## 2021-06-04 DIAGNOSIS — F0280 Dementia in other diseases classified elsewhere without behavioral disturbance: Secondary | ICD-10-CM | POA: Diagnosis not present

## 2021-06-04 DIAGNOSIS — I10 Essential (primary) hypertension: Secondary | ICD-10-CM | POA: Diagnosis not present

## 2021-06-04 DIAGNOSIS — R32 Unspecified urinary incontinence: Secondary | ICD-10-CM | POA: Diagnosis not present

## 2021-06-08 ENCOUNTER — Emergency Department (HOSPITAL_BASED_OUTPATIENT_CLINIC_OR_DEPARTMENT_OTHER)

## 2021-06-08 ENCOUNTER — Encounter (HOSPITAL_BASED_OUTPATIENT_CLINIC_OR_DEPARTMENT_OTHER): Payer: Self-pay

## 2021-06-08 ENCOUNTER — Emergency Department (HOSPITAL_BASED_OUTPATIENT_CLINIC_OR_DEPARTMENT_OTHER)
Admission: EM | Admit: 2021-06-08 | Discharge: 2021-06-08 | Disposition: A | Discharge status: Home / Self Care | Attending: Emergency Medicine | Admitting: Emergency Medicine

## 2021-06-08 DIAGNOSIS — I10 Essential (primary) hypertension: Secondary | ICD-10-CM | POA: Insufficient documentation

## 2021-06-08 DIAGNOSIS — Z859 Personal history of malignant neoplasm, unspecified: Secondary | ICD-10-CM | POA: Diagnosis not present

## 2021-06-08 DIAGNOSIS — W06XXXA Fall from bed, initial encounter: Secondary | ICD-10-CM | POA: Insufficient documentation

## 2021-06-08 DIAGNOSIS — F0390 Unspecified dementia without behavioral disturbance: Secondary | ICD-10-CM | POA: Insufficient documentation

## 2021-06-08 DIAGNOSIS — S0990XA Unspecified injury of head, initial encounter: Secondary | ICD-10-CM

## 2021-06-08 DIAGNOSIS — R404 Transient alteration of awareness: Secondary | ICD-10-CM | POA: Diagnosis not present

## 2021-06-08 DIAGNOSIS — Z7401 Bed confinement status: Secondary | ICD-10-CM | POA: Diagnosis not present

## 2021-06-08 DIAGNOSIS — W19XXXA Unspecified fall, initial encounter: Secondary | ICD-10-CM | POA: Diagnosis not present

## 2021-06-08 DIAGNOSIS — S0083XA Contusion of other part of head, initial encounter: Secondary | ICD-10-CM | POA: Diagnosis not present

## 2021-06-08 DIAGNOSIS — Z743 Need for continuous supervision: Secondary | ICD-10-CM | POA: Diagnosis not present

## 2021-06-08 DIAGNOSIS — I959 Hypotension, unspecified: Secondary | ICD-10-CM | POA: Diagnosis not present

## 2021-06-08 DIAGNOSIS — R0902 Hypoxemia: Secondary | ICD-10-CM | POA: Diagnosis not present

## 2021-06-08 NOTE — Discharge Instructions (Addendum)
You were evaluated in the Emergency Department and after careful evaluation, we did not find any emergent condition requiring admission or further testing in the hospital.  Your exam/testing today was overall reassuring.  Please return to the Emergency Department if you experience any worsening of your condition.  Thank you for allowing us to be a part of your care.  

## 2021-06-08 NOTE — ED Triage Notes (Signed)
Pt was BIBA from [facility] for an unwitnessed fall and was found on the ground during rounds. Pt hit her head with a hematoma to the right forehead. Denies any other pain. Not on blood thinners and unable to identify if she had LOC. Pt was attempting to climb out of bed with non-grip socks. Pt has dementia and is a poor historian.

## 2021-06-08 NOTE — ED Provider Notes (Signed)
[facility] Emergency Department Provider Note MRN:  [id number]  Arrival date & time: 06/08/21     Chief Complaint   Fall   History of Present Illness   Michelle Gregory is a 87 y.o. year-old female with a history of dementia presenting to the ED with chief complaint of fall.  Patient found on the ground during rounds at care facility.  Hematoma to the right forehead.  Suspect she fell out of bed.  I was unable to obtain an accurate HPI, PMH, or ROS due to the patient's dementia.  Level 5 caveat.  Review of Systems  Positive for head trauma, fall.  Patient's Health History    Past Medical History:  Diagnosis Date   Allergic rhinitis    Anxiety    C. difficile diarrhea 10/2017   Cancer ([place])    Dementia ([facility])    Depression    GERD (gastroesophageal reflux disease)    History of colon cancer    History of recurrent UTIs    Hyperlipidemia    Hypertension    Osteoarthritis    Spinal compression fracture ([facility])    Vertigo     Past Surgical History:  Procedure Laterality Date   APPENDECTOMY     CATARACT EXTRACTION     COLON RESECTION     INTRAMEDULLARY (IM) NAIL INTERTROCHANTERIC Left 06/22/2019   Procedure: INTRAMEDULLARY (IM) NAIL INTERTROCHANTRIC;  Surgeon: Rod Can, MD;  Location: [facility];  Service: Orthopedics;  Laterality: Left;   TONSILLECTOMY      Family History  Problem Relation Age of Onset   Prostate cancer Father    Heart attack Mother    Gout Mother     Social History   Socioeconomic History   Marital status: Widowed    Spouse name: Not on file   Number of children: 1   Years of education: Not on file   Highest education level: Not on file  Occupational History   Occupation: retired  Tobacco Use   Smoking status: Never   Smokeless tobacco: Never  Substance and Sexual Activity   Alcohol use: No    Alcohol/week: 0.0 standard drinks   Drug use: No   Sexual activity: Never  Other Topics Concern   Not on file   Social History Narrative   Not on file   Social Determinants of Health   Financial Resource Strain: Not on file  Food Insecurity: Not on file  Transportation Needs: Not on file  Physical Activity: Not on file  Stress: Not on file  Social Connections: Not on file  Intimate Partner Violence: Not on file     Physical Exam   Vitals:   06/08/21 0408  BP: (!) 174/89  Pulse: 77  Resp: 19  Temp: 97.8 F (36.6 C)  SpO2: 99%    CONSTITUTIONAL: Well-appearing, NAD NEURO:  Alert and oriented x 3, no focal deficits EYES:  eyes equal and reactive ENT/NECK:  no LAD, no JVD CARDIO: Regular rate, well-perfused, normal S1 and S2 PULM:  CTAB no wheezing or rhonchi GI/GU:  normal bowel sounds, non-distended, non-tender MSK/SPINE:  No gross deformities, no edema SKIN: Hematoma to the right forehead PSYCH:  Appropriate speech and behavior  *Additional and/or pertinent findings included in MDM below  Diagnostic and Interventional Summary    EKG Interpretation  Date/Time:    Ventricular Rate:    PR Interval:    QRS Duration:   QT Interval:    QTC Calculation:   R Axis:  Text Interpretation:         Labs Reviewed - No data to display  CT HEAD WO CONTRAST (5MM)  Final Result    CT CERVICAL SPINE WO CONTRAST  Final Result      Medications - No data to display   Procedures  /  Critical Care Procedures  ED Course and Medical Decision Making  I have reviewed the triage vital signs, the nursing notes, and pertinent available records from the EMR.  Listed above are laboratory and imaging tests that I personally ordered, reviewed, and interpreted and then considered in my medical decision making (see below for details).  Suspect mechanical fall out of bed, history of dementia, obvious signs of head trauma with hematoma to the right forehead with overlying abrasion.  Awaiting CT imaging.     CT imagine is reassuring.  Attempted to call patient's daughter but no answer.   Patient appropriate for dc.  Barth Kirks. Sedonia Small, MD [place] [email or web address]  Final Clinical Impressions(s) / ED Diagnoses     ICD-10-CM   1. Traumatic injury of head, initial encounter  S09.90XA     2. Traumatic hematoma of forehead, initial encounter  [id number]       ED Discharge Orders     None        Discharge Instructions Discussed with and Provided to Patient:     Discharge Instructions      You were evaluated in the Emergency Department and after careful evaluation, we did not find any emergent condition requiring admission or further testing in the hospital.  Your exam/testing today was overall reassuring.  Please return to the Emergency Department if you experience any worsening of your condition.  Thank you for allowing [place] to be a part of your care.         Maudie Flakes, MD 06/08/21 [phone]

## 2021-06-12 DIAGNOSIS — I10 Essential (primary) hypertension: Secondary | ICD-10-CM | POA: Diagnosis not present

## 2021-06-12 DIAGNOSIS — F0280 Dementia in other diseases classified elsewhere without behavioral disturbance: Secondary | ICD-10-CM | POA: Diagnosis not present

## 2021-06-12 DIAGNOSIS — K219 Gastro-esophageal reflux disease without esophagitis: Secondary | ICD-10-CM | POA: Diagnosis not present

## 2021-06-12 DIAGNOSIS — R159 Full incontinence of feces: Secondary | ICD-10-CM | POA: Diagnosis not present

## 2021-06-12 DIAGNOSIS — R32 Unspecified urinary incontinence: Secondary | ICD-10-CM | POA: Diagnosis not present

## 2021-06-12 DIAGNOSIS — G311 Senile degeneration of brain, not elsewhere classified: Secondary | ICD-10-CM | POA: Diagnosis not present

## 2021-06-13 DIAGNOSIS — R159 Full incontinence of feces: Secondary | ICD-10-CM | POA: Diagnosis not present

## 2021-06-13 DIAGNOSIS — R32 Unspecified urinary incontinence: Secondary | ICD-10-CM | POA: Diagnosis not present

## 2021-06-13 DIAGNOSIS — I10 Essential (primary) hypertension: Secondary | ICD-10-CM | POA: Diagnosis not present

## 2021-06-13 DIAGNOSIS — F0280 Dementia in other diseases classified elsewhere without behavioral disturbance: Secondary | ICD-10-CM | POA: Diagnosis not present

## 2021-06-13 DIAGNOSIS — K219 Gastro-esophageal reflux disease without esophagitis: Secondary | ICD-10-CM | POA: Diagnosis not present

## 2021-06-13 DIAGNOSIS — G311 Senile degeneration of brain, not elsewhere classified: Secondary | ICD-10-CM | POA: Diagnosis not present

## 2021-06-16 DIAGNOSIS — F0280 Dementia in other diseases classified elsewhere without behavioral disturbance: Secondary | ICD-10-CM | POA: Diagnosis not present

## 2021-06-16 DIAGNOSIS — K219 Gastro-esophageal reflux disease without esophagitis: Secondary | ICD-10-CM | POA: Diagnosis not present

## 2021-06-16 DIAGNOSIS — R32 Unspecified urinary incontinence: Secondary | ICD-10-CM | POA: Diagnosis not present

## 2021-06-16 DIAGNOSIS — R159 Full incontinence of feces: Secondary | ICD-10-CM | POA: Diagnosis not present

## 2021-06-16 DIAGNOSIS — I10 Essential (primary) hypertension: Secondary | ICD-10-CM | POA: Diagnosis not present

## 2021-06-16 DIAGNOSIS — G311 Senile degeneration of brain, not elsewhere classified: Secondary | ICD-10-CM | POA: Diagnosis not present

## 2021-06-17 ENCOUNTER — Telehealth: Payer: Self-pay | Admitting: Family Medicine

## 2021-06-17 DIAGNOSIS — L603 Nail dystrophy: Secondary | ICD-10-CM | POA: Diagnosis not present

## 2021-06-17 DIAGNOSIS — B351 Tinea unguium: Secondary | ICD-10-CM | POA: Diagnosis not present

## 2021-06-17 DIAGNOSIS — I7091 Generalized atherosclerosis: Secondary | ICD-10-CM | POA: Diagnosis not present

## 2021-06-17 NOTE — Telephone Encounter (Signed)
Pt daughter called wanting a call back.

## 2021-06-17 NOTE — Telephone Encounter (Signed)
Spoke with [name] (daughter) and she would like Dr. Glori Bickers to call her when able (no rush)  [phone]

## 2021-06-18 DIAGNOSIS — I10 Essential (primary) hypertension: Secondary | ICD-10-CM | POA: Diagnosis not present

## 2021-06-18 DIAGNOSIS — S0093XA Contusion of unspecified part of head, initial encounter: Secondary | ICD-10-CM | POA: Diagnosis not present

## 2021-06-18 DIAGNOSIS — W19XXXA Unspecified fall, initial encounter: Secondary | ICD-10-CM | POA: Diagnosis not present

## 2021-06-18 DIAGNOSIS — R32 Unspecified urinary incontinence: Secondary | ICD-10-CM | POA: Diagnosis not present

## 2021-06-18 DIAGNOSIS — F0280 Dementia in other diseases classified elsewhere without behavioral disturbance: Secondary | ICD-10-CM | POA: Diagnosis not present

## 2021-06-18 DIAGNOSIS — R159 Full incontinence of feces: Secondary | ICD-10-CM | POA: Diagnosis not present

## 2021-06-18 DIAGNOSIS — K219 Gastro-esophageal reflux disease without esophagitis: Secondary | ICD-10-CM | POA: Diagnosis not present

## 2021-06-18 DIAGNOSIS — Z9181 History of falling: Secondary | ICD-10-CM | POA: Diagnosis not present

## 2021-06-18 DIAGNOSIS — G311 Senile degeneration of brain, not elsewhere classified: Secondary | ICD-10-CM | POA: Diagnosis not present

## 2021-06-19 DIAGNOSIS — I10 Essential (primary) hypertension: Secondary | ICD-10-CM | POA: Diagnosis not present

## 2021-06-19 DIAGNOSIS — R159 Full incontinence of feces: Secondary | ICD-10-CM | POA: Diagnosis not present

## 2021-06-19 DIAGNOSIS — F0280 Dementia in other diseases classified elsewhere without behavioral disturbance: Secondary | ICD-10-CM | POA: Diagnosis not present

## 2021-06-19 DIAGNOSIS — K219 Gastro-esophageal reflux disease without esophagitis: Secondary | ICD-10-CM | POA: Diagnosis not present

## 2021-06-19 DIAGNOSIS — R32 Unspecified urinary incontinence: Secondary | ICD-10-CM | POA: Diagnosis not present

## 2021-06-19 DIAGNOSIS — G311 Senile degeneration of brain, not elsewhere classified: Secondary | ICD-10-CM | POA: Diagnosis not present

## 2021-06-24 DIAGNOSIS — G311 Senile degeneration of brain, not elsewhere classified: Secondary | ICD-10-CM | POA: Diagnosis not present

## 2021-06-24 DIAGNOSIS — R159 Full incontinence of feces: Secondary | ICD-10-CM | POA: Diagnosis not present

## 2021-06-24 DIAGNOSIS — I10 Essential (primary) hypertension: Secondary | ICD-10-CM | POA: Diagnosis not present

## 2021-06-24 DIAGNOSIS — R32 Unspecified urinary incontinence: Secondary | ICD-10-CM | POA: Diagnosis not present

## 2021-06-24 DIAGNOSIS — F0280 Dementia in other diseases classified elsewhere without behavioral disturbance: Secondary | ICD-10-CM | POA: Diagnosis not present

## 2021-06-24 DIAGNOSIS — K219 Gastro-esophageal reflux disease without esophagitis: Secondary | ICD-10-CM | POA: Diagnosis not present

## 2021-07-01 DIAGNOSIS — G311 Senile degeneration of brain, not elsewhere classified: Secondary | ICD-10-CM | POA: Diagnosis not present

## 2021-07-01 DIAGNOSIS — R32 Unspecified urinary incontinence: Secondary | ICD-10-CM | POA: Diagnosis not present

## 2021-07-01 DIAGNOSIS — F0280 Dementia in other diseases classified elsewhere without behavioral disturbance: Secondary | ICD-10-CM | POA: Diagnosis not present

## 2021-07-01 DIAGNOSIS — K219 Gastro-esophageal reflux disease without esophagitis: Secondary | ICD-10-CM | POA: Diagnosis not present

## 2021-07-01 DIAGNOSIS — R159 Full incontinence of feces: Secondary | ICD-10-CM | POA: Diagnosis not present

## 2021-07-01 DIAGNOSIS — I10 Essential (primary) hypertension: Secondary | ICD-10-CM | POA: Diagnosis not present

## 2021-07-02 DIAGNOSIS — G311 Senile degeneration of brain, not elsewhere classified: Secondary | ICD-10-CM | POA: Diagnosis not present

## 2021-07-02 DIAGNOSIS — R159 Full incontinence of feces: Secondary | ICD-10-CM | POA: Diagnosis not present

## 2021-07-02 DIAGNOSIS — I10 Essential (primary) hypertension: Secondary | ICD-10-CM | POA: Diagnosis not present

## 2021-07-02 DIAGNOSIS — K219 Gastro-esophageal reflux disease without esophagitis: Secondary | ICD-10-CM | POA: Diagnosis not present

## 2021-07-02 DIAGNOSIS — F0280 Dementia in other diseases classified elsewhere without behavioral disturbance: Secondary | ICD-10-CM | POA: Diagnosis not present

## 2021-07-02 DIAGNOSIS — R32 Unspecified urinary incontinence: Secondary | ICD-10-CM | POA: Diagnosis not present

## 2021-07-03 DIAGNOSIS — R159 Full incontinence of feces: Secondary | ICD-10-CM | POA: Diagnosis not present

## 2021-07-03 DIAGNOSIS — G311 Senile degeneration of brain, not elsewhere classified: Secondary | ICD-10-CM | POA: Diagnosis not present

## 2021-07-03 DIAGNOSIS — F330 Major depressive disorder, recurrent, mild: Secondary | ICD-10-CM | POA: Diagnosis not present

## 2021-07-03 DIAGNOSIS — F0280 Dementia in other diseases classified elsewhere without behavioral disturbance: Secondary | ICD-10-CM | POA: Diagnosis not present

## 2021-07-03 DIAGNOSIS — Z515 Encounter for palliative care: Secondary | ICD-10-CM | POA: Diagnosis not present

## 2021-07-03 DIAGNOSIS — R32 Unspecified urinary incontinence: Secondary | ICD-10-CM | POA: Diagnosis not present

## 2021-07-03 DIAGNOSIS — I10 Essential (primary) hypertension: Secondary | ICD-10-CM | POA: Diagnosis not present

## 2021-07-03 DIAGNOSIS — K219 Gastro-esophageal reflux disease without esophagitis: Secondary | ICD-10-CM | POA: Diagnosis not present

## 2021-07-10 DIAGNOSIS — K219 Gastro-esophageal reflux disease without esophagitis: Secondary | ICD-10-CM | POA: Diagnosis not present

## 2021-07-10 DIAGNOSIS — R32 Unspecified urinary incontinence: Secondary | ICD-10-CM | POA: Diagnosis not present

## 2021-07-10 DIAGNOSIS — R159 Full incontinence of feces: Secondary | ICD-10-CM | POA: Diagnosis not present

## 2021-07-10 DIAGNOSIS — G311 Senile degeneration of brain, not elsewhere classified: Secondary | ICD-10-CM | POA: Diagnosis not present

## 2021-07-10 DIAGNOSIS — I10 Essential (primary) hypertension: Secondary | ICD-10-CM | POA: Diagnosis not present

## 2021-07-10 DIAGNOSIS — F0280 Dementia in other diseases classified elsewhere without behavioral disturbance: Secondary | ICD-10-CM | POA: Diagnosis not present

## 2021-07-12 DIAGNOSIS — K219 Gastro-esophageal reflux disease without esophagitis: Secondary | ICD-10-CM | POA: Diagnosis not present

## 2021-07-12 DIAGNOSIS — G311 Senile degeneration of brain, not elsewhere classified: Secondary | ICD-10-CM | POA: Diagnosis not present

## 2021-07-12 DIAGNOSIS — R32 Unspecified urinary incontinence: Secondary | ICD-10-CM | POA: Diagnosis not present

## 2021-07-12 DIAGNOSIS — I10 Essential (primary) hypertension: Secondary | ICD-10-CM | POA: Diagnosis not present

## 2021-07-12 DIAGNOSIS — R159 Full incontinence of feces: Secondary | ICD-10-CM | POA: Diagnosis not present

## 2021-07-12 DIAGNOSIS — F0280 Dementia in other diseases classified elsewhere without behavioral disturbance: Secondary | ICD-10-CM | POA: Diagnosis not present

## 2021-07-13 DIAGNOSIS — G311 Senile degeneration of brain, not elsewhere classified: Secondary | ICD-10-CM | POA: Diagnosis not present

## 2021-07-13 DIAGNOSIS — R32 Unspecified urinary incontinence: Secondary | ICD-10-CM | POA: Diagnosis not present

## 2021-07-13 DIAGNOSIS — F0280 Dementia in other diseases classified elsewhere without behavioral disturbance: Secondary | ICD-10-CM | POA: Diagnosis not present

## 2021-07-13 DIAGNOSIS — R159 Full incontinence of feces: Secondary | ICD-10-CM | POA: Diagnosis not present

## 2021-07-13 DIAGNOSIS — K219 Gastro-esophageal reflux disease without esophagitis: Secondary | ICD-10-CM | POA: Diagnosis not present

## 2021-07-13 DIAGNOSIS — I10 Essential (primary) hypertension: Secondary | ICD-10-CM | POA: Diagnosis not present

## 2021-07-14 DIAGNOSIS — F0280 Dementia in other diseases classified elsewhere without behavioral disturbance: Secondary | ICD-10-CM | POA: Diagnosis not present

## 2021-07-14 DIAGNOSIS — R159 Full incontinence of feces: Secondary | ICD-10-CM | POA: Diagnosis not present

## 2021-07-14 DIAGNOSIS — K219 Gastro-esophageal reflux disease without esophagitis: Secondary | ICD-10-CM | POA: Diagnosis not present

## 2021-07-14 DIAGNOSIS — I10 Essential (primary) hypertension: Secondary | ICD-10-CM | POA: Diagnosis not present

## 2021-07-14 DIAGNOSIS — G311 Senile degeneration of brain, not elsewhere classified: Secondary | ICD-10-CM | POA: Diagnosis not present

## 2021-07-14 DIAGNOSIS — R32 Unspecified urinary incontinence: Secondary | ICD-10-CM | POA: Diagnosis not present

## 2021-07-15 DIAGNOSIS — K219 Gastro-esophageal reflux disease without esophagitis: Secondary | ICD-10-CM | POA: Diagnosis not present

## 2021-07-15 DIAGNOSIS — F0280 Dementia in other diseases classified elsewhere without behavioral disturbance: Secondary | ICD-10-CM | POA: Diagnosis not present

## 2021-07-15 DIAGNOSIS — R159 Full incontinence of feces: Secondary | ICD-10-CM | POA: Diagnosis not present

## 2021-07-15 DIAGNOSIS — R32 Unspecified urinary incontinence: Secondary | ICD-10-CM | POA: Diagnosis not present

## 2021-07-15 DIAGNOSIS — I10 Essential (primary) hypertension: Secondary | ICD-10-CM | POA: Diagnosis not present

## 2021-07-15 DIAGNOSIS — G311 Senile degeneration of brain, not elsewhere classified: Secondary | ICD-10-CM | POA: Diagnosis not present

## 2021-07-16 DIAGNOSIS — R32 Unspecified urinary incontinence: Secondary | ICD-10-CM | POA: Diagnosis not present

## 2021-07-16 DIAGNOSIS — R159 Full incontinence of feces: Secondary | ICD-10-CM | POA: Diagnosis not present

## 2021-07-16 DIAGNOSIS — F0280 Dementia in other diseases classified elsewhere without behavioral disturbance: Secondary | ICD-10-CM | POA: Diagnosis not present

## 2021-07-16 DIAGNOSIS — N390 Urinary tract infection, site not specified: Secondary | ICD-10-CM | POA: Diagnosis not present

## 2021-07-16 DIAGNOSIS — K219 Gastro-esophageal reflux disease without esophagitis: Secondary | ICD-10-CM | POA: Diagnosis not present

## 2021-07-16 DIAGNOSIS — G311 Senile degeneration of brain, not elsewhere classified: Secondary | ICD-10-CM | POA: Diagnosis not present

## 2021-07-16 DIAGNOSIS — I10 Essential (primary) hypertension: Secondary | ICD-10-CM | POA: Diagnosis not present

## 2021-07-18 ENCOUNTER — Telehealth: Payer: Self-pay

## 2021-07-18 NOTE — Telephone Encounter (Signed)
Called Daughter to schedule AWV and she informed me that patient is in a memory care unit

## 2021-07-21 DIAGNOSIS — G311 Senile degeneration of brain, not elsewhere classified: Secondary | ICD-10-CM | POA: Diagnosis not present

## 2021-07-21 DIAGNOSIS — R159 Full incontinence of feces: Secondary | ICD-10-CM | POA: Diagnosis not present

## 2021-07-21 DIAGNOSIS — K219 Gastro-esophageal reflux disease without esophagitis: Secondary | ICD-10-CM | POA: Diagnosis not present

## 2021-07-21 DIAGNOSIS — F0280 Dementia in other diseases classified elsewhere without behavioral disturbance: Secondary | ICD-10-CM | POA: Diagnosis not present

## 2021-07-21 DIAGNOSIS — I10 Essential (primary) hypertension: Secondary | ICD-10-CM | POA: Diagnosis not present

## 2021-07-21 DIAGNOSIS — R32 Unspecified urinary incontinence: Secondary | ICD-10-CM | POA: Diagnosis not present

## 2021-07-22 DIAGNOSIS — I10 Essential (primary) hypertension: Secondary | ICD-10-CM | POA: Diagnosis not present

## 2021-07-22 DIAGNOSIS — F0280 Dementia in other diseases classified elsewhere without behavioral disturbance: Secondary | ICD-10-CM | POA: Diagnosis not present

## 2021-07-22 DIAGNOSIS — R159 Full incontinence of feces: Secondary | ICD-10-CM | POA: Diagnosis not present

## 2021-07-22 DIAGNOSIS — G311 Senile degeneration of brain, not elsewhere classified: Secondary | ICD-10-CM | POA: Diagnosis not present

## 2021-07-22 DIAGNOSIS — R32 Unspecified urinary incontinence: Secondary | ICD-10-CM | POA: Diagnosis not present

## 2021-07-22 DIAGNOSIS — K219 Gastro-esophageal reflux disease without esophagitis: Secondary | ICD-10-CM | POA: Diagnosis not present

## 2021-07-23 DIAGNOSIS — R32 Unspecified urinary incontinence: Secondary | ICD-10-CM | POA: Diagnosis not present

## 2021-07-23 DIAGNOSIS — K219 Gastro-esophageal reflux disease without esophagitis: Secondary | ICD-10-CM | POA: Diagnosis not present

## 2021-07-23 DIAGNOSIS — R159 Full incontinence of feces: Secondary | ICD-10-CM | POA: Diagnosis not present

## 2021-07-23 DIAGNOSIS — I10 Essential (primary) hypertension: Secondary | ICD-10-CM | POA: Diagnosis not present

## 2021-07-23 DIAGNOSIS — G311 Senile degeneration of brain, not elsewhere classified: Secondary | ICD-10-CM | POA: Diagnosis not present

## 2021-07-23 DIAGNOSIS — F0280 Dementia in other diseases classified elsewhere without behavioral disturbance: Secondary | ICD-10-CM | POA: Diagnosis not present

## 2021-07-30 DIAGNOSIS — F0280 Dementia in other diseases classified elsewhere without behavioral disturbance: Secondary | ICD-10-CM | POA: Diagnosis not present

## 2021-07-30 DIAGNOSIS — K219 Gastro-esophageal reflux disease without esophagitis: Secondary | ICD-10-CM | POA: Diagnosis not present

## 2021-07-30 DIAGNOSIS — G311 Senile degeneration of brain, not elsewhere classified: Secondary | ICD-10-CM | POA: Diagnosis not present

## 2021-07-30 DIAGNOSIS — I10 Essential (primary) hypertension: Secondary | ICD-10-CM | POA: Diagnosis not present

## 2021-07-30 DIAGNOSIS — M6281 Muscle weakness (generalized): Secondary | ICD-10-CM | POA: Diagnosis not present

## 2021-07-30 DIAGNOSIS — R159 Full incontinence of feces: Secondary | ICD-10-CM | POA: Diagnosis not present

## 2021-07-30 DIAGNOSIS — W19XXXA Unspecified fall, initial encounter: Secondary | ICD-10-CM | POA: Diagnosis not present

## 2021-07-30 DIAGNOSIS — R32 Unspecified urinary incontinence: Secondary | ICD-10-CM | POA: Diagnosis not present

## 2021-08-02 DIAGNOSIS — Z515 Encounter for palliative care: Secondary | ICD-10-CM | POA: Diagnosis not present

## 2021-08-02 DIAGNOSIS — F0280 Dementia in other diseases classified elsewhere without behavioral disturbance: Secondary | ICD-10-CM | POA: Diagnosis not present

## 2021-08-02 DIAGNOSIS — G311 Senile degeneration of brain, not elsewhere classified: Secondary | ICD-10-CM | POA: Diagnosis not present

## 2021-08-02 DIAGNOSIS — K219 Gastro-esophageal reflux disease without esophagitis: Secondary | ICD-10-CM | POA: Diagnosis not present

## 2021-08-02 DIAGNOSIS — R32 Unspecified urinary incontinence: Secondary | ICD-10-CM | POA: Diagnosis not present

## 2021-08-02 DIAGNOSIS — R159 Full incontinence of feces: Secondary | ICD-10-CM | POA: Diagnosis not present

## 2021-08-02 DIAGNOSIS — I10 Essential (primary) hypertension: Secondary | ICD-10-CM | POA: Diagnosis not present

## 2021-08-02 DIAGNOSIS — F330 Major depressive disorder, recurrent, mild: Secondary | ICD-10-CM | POA: Diagnosis not present

## 2021-08-03 DIAGNOSIS — F0280 Dementia in other diseases classified elsewhere without behavioral disturbance: Secondary | ICD-10-CM | POA: Diagnosis not present

## 2021-08-03 DIAGNOSIS — R159 Full incontinence of feces: Secondary | ICD-10-CM | POA: Diagnosis not present

## 2021-08-03 DIAGNOSIS — K219 Gastro-esophageal reflux disease without esophagitis: Secondary | ICD-10-CM | POA: Diagnosis not present

## 2021-08-03 DIAGNOSIS — R32 Unspecified urinary incontinence: Secondary | ICD-10-CM | POA: Diagnosis not present

## 2021-08-03 DIAGNOSIS — G311 Senile degeneration of brain, not elsewhere classified: Secondary | ICD-10-CM | POA: Diagnosis not present

## 2021-08-03 DIAGNOSIS — I10 Essential (primary) hypertension: Secondary | ICD-10-CM | POA: Diagnosis not present

## 2021-08-06 DIAGNOSIS — R159 Full incontinence of feces: Secondary | ICD-10-CM | POA: Diagnosis not present

## 2021-08-06 DIAGNOSIS — F0280 Dementia in other diseases classified elsewhere without behavioral disturbance: Secondary | ICD-10-CM | POA: Diagnosis not present

## 2021-08-06 DIAGNOSIS — K219 Gastro-esophageal reflux disease without esophagitis: Secondary | ICD-10-CM | POA: Diagnosis not present

## 2021-08-06 DIAGNOSIS — R32 Unspecified urinary incontinence: Secondary | ICD-10-CM | POA: Diagnosis not present

## 2021-08-06 DIAGNOSIS — I10 Essential (primary) hypertension: Secondary | ICD-10-CM | POA: Diagnosis not present

## 2021-08-06 DIAGNOSIS — G311 Senile degeneration of brain, not elsewhere classified: Secondary | ICD-10-CM | POA: Diagnosis not present

## 2021-08-08 DIAGNOSIS — K219 Gastro-esophageal reflux disease without esophagitis: Secondary | ICD-10-CM | POA: Diagnosis not present

## 2021-08-08 DIAGNOSIS — R159 Full incontinence of feces: Secondary | ICD-10-CM | POA: Diagnosis not present

## 2021-08-08 DIAGNOSIS — R32 Unspecified urinary incontinence: Secondary | ICD-10-CM | POA: Diagnosis not present

## 2021-08-08 DIAGNOSIS — I10 Essential (primary) hypertension: Secondary | ICD-10-CM | POA: Diagnosis not present

## 2021-08-08 DIAGNOSIS — G311 Senile degeneration of brain, not elsewhere classified: Secondary | ICD-10-CM | POA: Diagnosis not present

## 2021-08-08 DIAGNOSIS — F0280 Dementia in other diseases classified elsewhere without behavioral disturbance: Secondary | ICD-10-CM | POA: Diagnosis not present

## 2021-08-11 DIAGNOSIS — I10 Essential (primary) hypertension: Secondary | ICD-10-CM | POA: Diagnosis not present

## 2021-08-11 DIAGNOSIS — G311 Senile degeneration of brain, not elsewhere classified: Secondary | ICD-10-CM | POA: Diagnosis not present

## 2021-08-11 DIAGNOSIS — R32 Unspecified urinary incontinence: Secondary | ICD-10-CM | POA: Diagnosis not present

## 2021-08-11 DIAGNOSIS — R159 Full incontinence of feces: Secondary | ICD-10-CM | POA: Diagnosis not present

## 2021-08-11 DIAGNOSIS — K219 Gastro-esophageal reflux disease without esophagitis: Secondary | ICD-10-CM | POA: Diagnosis not present

## 2021-08-11 DIAGNOSIS — F0280 Dementia in other diseases classified elsewhere without behavioral disturbance: Secondary | ICD-10-CM | POA: Diagnosis not present

## 2021-08-13 DIAGNOSIS — R159 Full incontinence of feces: Secondary | ICD-10-CM | POA: Diagnosis not present

## 2021-08-13 DIAGNOSIS — K219 Gastro-esophageal reflux disease without esophagitis: Secondary | ICD-10-CM | POA: Diagnosis not present

## 2021-08-13 DIAGNOSIS — F0280 Dementia in other diseases classified elsewhere without behavioral disturbance: Secondary | ICD-10-CM | POA: Diagnosis not present

## 2021-08-13 DIAGNOSIS — I10 Essential (primary) hypertension: Secondary | ICD-10-CM | POA: Diagnosis not present

## 2021-08-13 DIAGNOSIS — G311 Senile degeneration of brain, not elsewhere classified: Secondary | ICD-10-CM | POA: Diagnosis not present

## 2021-08-13 DIAGNOSIS — R32 Unspecified urinary incontinence: Secondary | ICD-10-CM | POA: Diagnosis not present

## 2021-08-14 DIAGNOSIS — Z23 Encounter for immunization: Secondary | ICD-10-CM | POA: Diagnosis not present

## 2021-08-15 DIAGNOSIS — R32 Unspecified urinary incontinence: Secondary | ICD-10-CM | POA: Diagnosis not present

## 2021-08-15 DIAGNOSIS — R159 Full incontinence of feces: Secondary | ICD-10-CM | POA: Diagnosis not present

## 2021-08-15 DIAGNOSIS — K219 Gastro-esophageal reflux disease without esophagitis: Secondary | ICD-10-CM | POA: Diagnosis not present

## 2021-08-15 DIAGNOSIS — I10 Essential (primary) hypertension: Secondary | ICD-10-CM | POA: Diagnosis not present

## 2021-08-15 DIAGNOSIS — G311 Senile degeneration of brain, not elsewhere classified: Secondary | ICD-10-CM | POA: Diagnosis not present

## 2021-08-15 DIAGNOSIS — F0280 Dementia in other diseases classified elsewhere without behavioral disturbance: Secondary | ICD-10-CM | POA: Diagnosis not present

## 2021-08-20 DIAGNOSIS — G311 Senile degeneration of brain, not elsewhere classified: Secondary | ICD-10-CM | POA: Diagnosis not present

## 2021-08-20 DIAGNOSIS — R159 Full incontinence of feces: Secondary | ICD-10-CM | POA: Diagnosis not present

## 2021-08-20 DIAGNOSIS — I10 Essential (primary) hypertension: Secondary | ICD-10-CM | POA: Diagnosis not present

## 2021-08-20 DIAGNOSIS — F0280 Dementia in other diseases classified elsewhere without behavioral disturbance: Secondary | ICD-10-CM | POA: Diagnosis not present

## 2021-08-20 DIAGNOSIS — K219 Gastro-esophageal reflux disease without esophagitis: Secondary | ICD-10-CM | POA: Diagnosis not present

## 2021-08-20 DIAGNOSIS — R32 Unspecified urinary incontinence: Secondary | ICD-10-CM | POA: Diagnosis not present

## 2021-08-21 DIAGNOSIS — G311 Senile degeneration of brain, not elsewhere classified: Secondary | ICD-10-CM | POA: Diagnosis not present

## 2021-08-21 DIAGNOSIS — I10 Essential (primary) hypertension: Secondary | ICD-10-CM | POA: Diagnosis not present

## 2021-08-21 DIAGNOSIS — F0280 Dementia in other diseases classified elsewhere without behavioral disturbance: Secondary | ICD-10-CM | POA: Diagnosis not present

## 2021-08-21 DIAGNOSIS — R32 Unspecified urinary incontinence: Secondary | ICD-10-CM | POA: Diagnosis not present

## 2021-08-21 DIAGNOSIS — K219 Gastro-esophageal reflux disease without esophagitis: Secondary | ICD-10-CM | POA: Diagnosis not present

## 2021-08-21 DIAGNOSIS — R159 Full incontinence of feces: Secondary | ICD-10-CM | POA: Diagnosis not present

## 2021-08-27 DIAGNOSIS — R159 Full incontinence of feces: Secondary | ICD-10-CM | POA: Diagnosis not present

## 2021-08-27 DIAGNOSIS — R32 Unspecified urinary incontinence: Secondary | ICD-10-CM | POA: Diagnosis not present

## 2021-08-27 DIAGNOSIS — G311 Senile degeneration of brain, not elsewhere classified: Secondary | ICD-10-CM | POA: Diagnosis not present

## 2021-08-27 DIAGNOSIS — K219 Gastro-esophageal reflux disease without esophagitis: Secondary | ICD-10-CM | POA: Diagnosis not present

## 2021-08-27 DIAGNOSIS — F0280 Dementia in other diseases classified elsewhere without behavioral disturbance: Secondary | ICD-10-CM | POA: Diagnosis not present

## 2021-08-27 DIAGNOSIS — I10 Essential (primary) hypertension: Secondary | ICD-10-CM | POA: Diagnosis not present

## 2021-08-28 DIAGNOSIS — G311 Senile degeneration of brain, not elsewhere classified: Secondary | ICD-10-CM | POA: Diagnosis not present

## 2021-08-28 DIAGNOSIS — K219 Gastro-esophageal reflux disease without esophagitis: Secondary | ICD-10-CM | POA: Diagnosis not present

## 2021-08-28 DIAGNOSIS — R32 Unspecified urinary incontinence: Secondary | ICD-10-CM | POA: Diagnosis not present

## 2021-08-28 DIAGNOSIS — I10 Essential (primary) hypertension: Secondary | ICD-10-CM | POA: Diagnosis not present

## 2021-08-28 DIAGNOSIS — R159 Full incontinence of feces: Secondary | ICD-10-CM | POA: Diagnosis not present

## 2021-08-28 DIAGNOSIS — F0280 Dementia in other diseases classified elsewhere without behavioral disturbance: Secondary | ICD-10-CM | POA: Diagnosis not present

## 2021-09-01 DIAGNOSIS — F0280 Dementia in other diseases classified elsewhere without behavioral disturbance: Secondary | ICD-10-CM | POA: Diagnosis not present

## 2021-09-01 DIAGNOSIS — I10 Essential (primary) hypertension: Secondary | ICD-10-CM | POA: Diagnosis not present

## 2021-09-01 DIAGNOSIS — R159 Full incontinence of feces: Secondary | ICD-10-CM | POA: Diagnosis not present

## 2021-09-01 DIAGNOSIS — G311 Senile degeneration of brain, not elsewhere classified: Secondary | ICD-10-CM | POA: Diagnosis not present

## 2021-09-01 DIAGNOSIS — R32 Unspecified urinary incontinence: Secondary | ICD-10-CM | POA: Diagnosis not present

## 2021-09-01 DIAGNOSIS — K219 Gastro-esophageal reflux disease without esophagitis: Secondary | ICD-10-CM | POA: Diagnosis not present

## 2021-09-02 DIAGNOSIS — F330 Major depressive disorder, recurrent, mild: Secondary | ICD-10-CM | POA: Diagnosis not present

## 2021-09-02 DIAGNOSIS — Z515 Encounter for palliative care: Secondary | ICD-10-CM | POA: Diagnosis not present

## 2021-09-02 DIAGNOSIS — K219 Gastro-esophageal reflux disease without esophagitis: Secondary | ICD-10-CM | POA: Diagnosis not present

## 2021-09-02 DIAGNOSIS — G311 Senile degeneration of brain, not elsewhere classified: Secondary | ICD-10-CM | POA: Diagnosis not present

## 2021-09-02 DIAGNOSIS — I10 Essential (primary) hypertension: Secondary | ICD-10-CM | POA: Diagnosis not present

## 2021-09-02 DIAGNOSIS — R159 Full incontinence of feces: Secondary | ICD-10-CM | POA: Diagnosis not present

## 2021-09-02 DIAGNOSIS — F0280 Dementia in other diseases classified elsewhere without behavioral disturbance: Secondary | ICD-10-CM | POA: Diagnosis not present

## 2021-09-02 DIAGNOSIS — R32 Unspecified urinary incontinence: Secondary | ICD-10-CM | POA: Diagnosis not present

## 2021-09-03 DIAGNOSIS — G311 Senile degeneration of brain, not elsewhere classified: Secondary | ICD-10-CM | POA: Diagnosis not present

## 2021-09-03 DIAGNOSIS — R32 Unspecified urinary incontinence: Secondary | ICD-10-CM | POA: Diagnosis not present

## 2021-09-03 DIAGNOSIS — K219 Gastro-esophageal reflux disease without esophagitis: Secondary | ICD-10-CM | POA: Diagnosis not present

## 2021-09-03 DIAGNOSIS — I10 Essential (primary) hypertension: Secondary | ICD-10-CM | POA: Diagnosis not present

## 2021-09-03 DIAGNOSIS — R159 Full incontinence of feces: Secondary | ICD-10-CM | POA: Diagnosis not present

## 2021-09-03 DIAGNOSIS — F0280 Dementia in other diseases classified elsewhere without behavioral disturbance: Secondary | ICD-10-CM | POA: Diagnosis not present

## 2021-09-08 DIAGNOSIS — R159 Full incontinence of feces: Secondary | ICD-10-CM | POA: Diagnosis not present

## 2021-09-08 DIAGNOSIS — K219 Gastro-esophageal reflux disease without esophagitis: Secondary | ICD-10-CM | POA: Diagnosis not present

## 2021-09-08 DIAGNOSIS — F0280 Dementia in other diseases classified elsewhere without behavioral disturbance: Secondary | ICD-10-CM | POA: Diagnosis not present

## 2021-09-08 DIAGNOSIS — I10 Essential (primary) hypertension: Secondary | ICD-10-CM | POA: Diagnosis not present

## 2021-09-08 DIAGNOSIS — G311 Senile degeneration of brain, not elsewhere classified: Secondary | ICD-10-CM | POA: Diagnosis not present

## 2021-09-08 DIAGNOSIS — R32 Unspecified urinary incontinence: Secondary | ICD-10-CM | POA: Diagnosis not present

## 2021-09-10 DIAGNOSIS — F0280 Dementia in other diseases classified elsewhere without behavioral disturbance: Secondary | ICD-10-CM | POA: Diagnosis not present

## 2021-09-10 DIAGNOSIS — K219 Gastro-esophageal reflux disease without esophagitis: Secondary | ICD-10-CM | POA: Diagnosis not present

## 2021-09-10 DIAGNOSIS — R32 Unspecified urinary incontinence: Secondary | ICD-10-CM | POA: Diagnosis not present

## 2021-09-10 DIAGNOSIS — R159 Full incontinence of feces: Secondary | ICD-10-CM | POA: Diagnosis not present

## 2021-09-10 DIAGNOSIS — G311 Senile degeneration of brain, not elsewhere classified: Secondary | ICD-10-CM | POA: Diagnosis not present

## 2021-09-10 DIAGNOSIS — I10 Essential (primary) hypertension: Secondary | ICD-10-CM | POA: Diagnosis not present

## 2021-09-15 DIAGNOSIS — I7091 Generalized atherosclerosis: Secondary | ICD-10-CM | POA: Diagnosis not present

## 2021-09-15 DIAGNOSIS — B351 Tinea unguium: Secondary | ICD-10-CM | POA: Diagnosis not present

## 2021-09-15 DIAGNOSIS — L603 Nail dystrophy: Secondary | ICD-10-CM | POA: Diagnosis not present

## 2021-09-17 DIAGNOSIS — K219 Gastro-esophageal reflux disease without esophagitis: Secondary | ICD-10-CM | POA: Diagnosis not present

## 2021-09-17 DIAGNOSIS — F0280 Dementia in other diseases classified elsewhere without behavioral disturbance: Secondary | ICD-10-CM | POA: Diagnosis not present

## 2021-09-17 DIAGNOSIS — R159 Full incontinence of feces: Secondary | ICD-10-CM | POA: Diagnosis not present

## 2021-09-17 DIAGNOSIS — G311 Senile degeneration of brain, not elsewhere classified: Secondary | ICD-10-CM | POA: Diagnosis not present

## 2021-09-17 DIAGNOSIS — R32 Unspecified urinary incontinence: Secondary | ICD-10-CM | POA: Diagnosis not present

## 2021-09-17 DIAGNOSIS — I10 Essential (primary) hypertension: Secondary | ICD-10-CM | POA: Diagnosis not present

## 2021-09-21 DIAGNOSIS — K219 Gastro-esophageal reflux disease without esophagitis: Secondary | ICD-10-CM | POA: Diagnosis not present

## 2021-09-21 DIAGNOSIS — G311 Senile degeneration of brain, not elsewhere classified: Secondary | ICD-10-CM | POA: Diagnosis not present

## 2021-09-21 DIAGNOSIS — F0280 Dementia in other diseases classified elsewhere without behavioral disturbance: Secondary | ICD-10-CM | POA: Diagnosis not present

## 2021-09-21 DIAGNOSIS — R32 Unspecified urinary incontinence: Secondary | ICD-10-CM | POA: Diagnosis not present

## 2021-09-21 DIAGNOSIS — I10 Essential (primary) hypertension: Secondary | ICD-10-CM | POA: Diagnosis not present

## 2021-09-21 DIAGNOSIS — R159 Full incontinence of feces: Secondary | ICD-10-CM | POA: Diagnosis not present

## 2021-10-01 DIAGNOSIS — R159 Full incontinence of feces: Secondary | ICD-10-CM | POA: Diagnosis not present

## 2021-10-01 DIAGNOSIS — Z9181 History of falling: Secondary | ICD-10-CM | POA: Diagnosis not present

## 2021-10-01 DIAGNOSIS — G9341 Metabolic encephalopathy: Secondary | ICD-10-CM | POA: Diagnosis not present

## 2021-10-01 DIAGNOSIS — F419 Anxiety disorder, unspecified: Secondary | ICD-10-CM | POA: Diagnosis not present

## 2021-10-01 DIAGNOSIS — K219 Gastro-esophageal reflux disease without esophagitis: Secondary | ICD-10-CM | POA: Diagnosis not present

## 2021-10-01 DIAGNOSIS — N3946 Mixed incontinence: Secondary | ICD-10-CM | POA: Diagnosis not present

## 2021-10-01 DIAGNOSIS — F0280 Dementia in other diseases classified elsewhere without behavioral disturbance: Secondary | ICD-10-CM | POA: Diagnosis not present

## 2021-10-01 DIAGNOSIS — M545 Low back pain, unspecified: Secondary | ICD-10-CM | POA: Diagnosis not present

## 2021-10-01 DIAGNOSIS — M6281 Muscle weakness (generalized): Secondary | ICD-10-CM | POA: Diagnosis not present

## 2021-10-01 DIAGNOSIS — F03911 Unspecified dementia, unspecified severity, with agitation: Secondary | ICD-10-CM | POA: Diagnosis not present

## 2021-10-01 DIAGNOSIS — G311 Senile degeneration of brain, not elsewhere classified: Secondary | ICD-10-CM | POA: Diagnosis not present

## 2021-10-01 DIAGNOSIS — I10 Essential (primary) hypertension: Secondary | ICD-10-CM | POA: Diagnosis not present

## 2021-10-01 DIAGNOSIS — R32 Unspecified urinary incontinence: Secondary | ICD-10-CM | POA: Diagnosis not present

## 2021-10-01 DIAGNOSIS — R627 Adult failure to thrive: Secondary | ICD-10-CM | POA: Diagnosis not present

## 2021-10-01 DIAGNOSIS — M1000 Idiopathic gout, unspecified site: Secondary | ICD-10-CM | POA: Diagnosis not present

## 2021-10-02 DIAGNOSIS — Z515 Encounter for palliative care: Secondary | ICD-10-CM | POA: Diagnosis not present

## 2021-10-02 DIAGNOSIS — K219 Gastro-esophageal reflux disease without esophagitis: Secondary | ICD-10-CM | POA: Diagnosis not present

## 2021-10-02 DIAGNOSIS — G311 Senile degeneration of brain, not elsewhere classified: Secondary | ICD-10-CM | POA: Diagnosis not present

## 2021-10-02 DIAGNOSIS — F330 Major depressive disorder, recurrent, mild: Secondary | ICD-10-CM | POA: Diagnosis not present

## 2021-10-02 DIAGNOSIS — R159 Full incontinence of feces: Secondary | ICD-10-CM | POA: Diagnosis not present

## 2021-10-02 DIAGNOSIS — I10 Essential (primary) hypertension: Secondary | ICD-10-CM | POA: Diagnosis not present

## 2021-10-02 DIAGNOSIS — F0280 Dementia in other diseases classified elsewhere without behavioral disturbance: Secondary | ICD-10-CM | POA: Diagnosis not present

## 2021-10-02 DIAGNOSIS — R32 Unspecified urinary incontinence: Secondary | ICD-10-CM | POA: Diagnosis not present

## 2021-10-10 DIAGNOSIS — G311 Senile degeneration of brain, not elsewhere classified: Secondary | ICD-10-CM | POA: Diagnosis not present

## 2021-10-10 DIAGNOSIS — I10 Essential (primary) hypertension: Secondary | ICD-10-CM | POA: Diagnosis not present

## 2021-10-10 DIAGNOSIS — F0280 Dementia in other diseases classified elsewhere without behavioral disturbance: Secondary | ICD-10-CM | POA: Diagnosis not present

## 2021-10-10 DIAGNOSIS — K219 Gastro-esophageal reflux disease without esophagitis: Secondary | ICD-10-CM | POA: Diagnosis not present

## 2021-10-10 DIAGNOSIS — R32 Unspecified urinary incontinence: Secondary | ICD-10-CM | POA: Diagnosis not present

## 2021-10-10 DIAGNOSIS — R159 Full incontinence of feces: Secondary | ICD-10-CM | POA: Diagnosis not present

## 2021-10-15 DIAGNOSIS — I10 Essential (primary) hypertension: Secondary | ICD-10-CM | POA: Diagnosis not present

## 2021-10-15 DIAGNOSIS — K219 Gastro-esophageal reflux disease without esophagitis: Secondary | ICD-10-CM | POA: Diagnosis not present

## 2021-10-15 DIAGNOSIS — R32 Unspecified urinary incontinence: Secondary | ICD-10-CM | POA: Diagnosis not present

## 2021-10-15 DIAGNOSIS — F0280 Dementia in other diseases classified elsewhere without behavioral disturbance: Secondary | ICD-10-CM | POA: Diagnosis not present

## 2021-10-15 DIAGNOSIS — G311 Senile degeneration of brain, not elsewhere classified: Secondary | ICD-10-CM | POA: Diagnosis not present

## 2021-10-15 DIAGNOSIS — R159 Full incontinence of feces: Secondary | ICD-10-CM | POA: Diagnosis not present

## 2021-10-17 DIAGNOSIS — R159 Full incontinence of feces: Secondary | ICD-10-CM | POA: Diagnosis not present

## 2021-10-17 DIAGNOSIS — I10 Essential (primary) hypertension: Secondary | ICD-10-CM | POA: Diagnosis not present

## 2021-10-17 DIAGNOSIS — K219 Gastro-esophageal reflux disease without esophagitis: Secondary | ICD-10-CM | POA: Diagnosis not present

## 2021-10-17 DIAGNOSIS — F0280 Dementia in other diseases classified elsewhere without behavioral disturbance: Secondary | ICD-10-CM | POA: Diagnosis not present

## 2021-10-17 DIAGNOSIS — R32 Unspecified urinary incontinence: Secondary | ICD-10-CM | POA: Diagnosis not present

## 2021-10-17 DIAGNOSIS — G311 Senile degeneration of brain, not elsewhere classified: Secondary | ICD-10-CM | POA: Diagnosis not present

## 2021-10-21 DIAGNOSIS — K219 Gastro-esophageal reflux disease without esophagitis: Secondary | ICD-10-CM | POA: Diagnosis not present

## 2021-10-21 DIAGNOSIS — F0280 Dementia in other diseases classified elsewhere without behavioral disturbance: Secondary | ICD-10-CM | POA: Diagnosis not present

## 2021-10-21 DIAGNOSIS — R32 Unspecified urinary incontinence: Secondary | ICD-10-CM | POA: Diagnosis not present

## 2021-10-21 DIAGNOSIS — I10 Essential (primary) hypertension: Secondary | ICD-10-CM | POA: Diagnosis not present

## 2021-10-21 DIAGNOSIS — R159 Full incontinence of feces: Secondary | ICD-10-CM | POA: Diagnosis not present

## 2021-10-21 DIAGNOSIS — G311 Senile degeneration of brain, not elsewhere classified: Secondary | ICD-10-CM | POA: Diagnosis not present

## 2021-10-28 DIAGNOSIS — G311 Senile degeneration of brain, not elsewhere classified: Secondary | ICD-10-CM | POA: Diagnosis not present

## 2021-10-28 DIAGNOSIS — R159 Full incontinence of feces: Secondary | ICD-10-CM | POA: Diagnosis not present

## 2021-10-28 DIAGNOSIS — I10 Essential (primary) hypertension: Secondary | ICD-10-CM | POA: Diagnosis not present

## 2021-10-28 DIAGNOSIS — R32 Unspecified urinary incontinence: Secondary | ICD-10-CM | POA: Diagnosis not present

## 2021-10-28 DIAGNOSIS — F0280 Dementia in other diseases classified elsewhere without behavioral disturbance: Secondary | ICD-10-CM | POA: Diagnosis not present

## 2021-10-28 DIAGNOSIS — K219 Gastro-esophageal reflux disease without esophagitis: Secondary | ICD-10-CM | POA: Diagnosis not present

## 2021-12-03 DEATH — deceased

## 2023-06-23 ENCOUNTER — Telehealth: Payer: Self-pay | Admitting: Family Medicine

## 2023-06-23 NOTE — Telephone Encounter (Signed)
Called number on file and could not be completed as dialed
